# Patient Record
Sex: Female | Born: 1970 | Race: White | Hispanic: No | Marital: Married | Smoking: Never smoker
Health system: Southern US, Community
[De-identification: ages and names within clinical notes are randomized; demographics above are authoritative.]

## PROBLEM LIST (undated history)

## (undated) DIAGNOSIS — F32A Depression, unspecified: Secondary | ICD-10-CM

## (undated) DIAGNOSIS — K219 Gastro-esophageal reflux disease without esophagitis: Secondary | ICD-10-CM

## (undated) DIAGNOSIS — M1990 Unspecified osteoarthritis, unspecified site: Secondary | ICD-10-CM

## (undated) DIAGNOSIS — J302 Other seasonal allergic rhinitis: Secondary | ICD-10-CM

## (undated) DIAGNOSIS — J189 Pneumonia, unspecified organism: Secondary | ICD-10-CM

## (undated) DIAGNOSIS — D759 Disease of blood and blood-forming organs, unspecified: Secondary | ICD-10-CM

## (undated) DIAGNOSIS — C801 Malignant (primary) neoplasm, unspecified: Secondary | ICD-10-CM

## (undated) DIAGNOSIS — Z8041 Family history of malignant neoplasm of ovary: Secondary | ICD-10-CM

## (undated) DIAGNOSIS — Z923 Personal history of irradiation: Secondary | ICD-10-CM

## (undated) DIAGNOSIS — R011 Cardiac murmur, unspecified: Secondary | ICD-10-CM

## (undated) DIAGNOSIS — Z803 Family history of malignant neoplasm of breast: Secondary | ICD-10-CM

## (undated) DIAGNOSIS — Z801 Family history of malignant neoplasm of trachea, bronchus and lung: Secondary | ICD-10-CM

## (undated) DIAGNOSIS — F419 Anxiety disorder, unspecified: Secondary | ICD-10-CM

## (undated) HISTORY — PX: BREAST EXCISIONAL BIOPSY: SUR124

## (undated) HISTORY — PX: WISDOM TOOTH EXTRACTION: SHX21

## (undated) HISTORY — DX: Family history of malignant neoplasm of breast: Z80.3

## (undated) HISTORY — DX: Family history of malignant neoplasm of ovary: Z80.41

## (undated) HISTORY — DX: Family history of malignant neoplasm of trachea, bronchus and lung: Z80.1

---
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
## 2006-08-03 HISTORY — PX: CHOLECYSTECTOMY: SHX55

## 2011-12-23 DIAGNOSIS — J209 Acute bronchitis, unspecified: Secondary | ICD-10-CM | POA: Insufficient documentation

## 2012-01-20 DIAGNOSIS — I341 Nonrheumatic mitral (valve) prolapse: Secondary | ICD-10-CM | POA: Insufficient documentation

## 2012-01-20 DIAGNOSIS — R0789 Other chest pain: Secondary | ICD-10-CM | POA: Insufficient documentation

## 2012-01-20 DIAGNOSIS — D6851 Activated protein C resistance: Secondary | ICD-10-CM | POA: Insufficient documentation

## 2013-11-15 DIAGNOSIS — G459 Transient cerebral ischemic attack, unspecified: Secondary | ICD-10-CM | POA: Insufficient documentation

## 2013-11-16 DIAGNOSIS — F419 Anxiety disorder, unspecified: Secondary | ICD-10-CM | POA: Insufficient documentation

## 2013-11-16 DIAGNOSIS — D6859 Other primary thrombophilia: Secondary | ICD-10-CM | POA: Insufficient documentation

## 2017-04-10 DIAGNOSIS — R519 Headache, unspecified: Secondary | ICD-10-CM | POA: Insufficient documentation

## 2017-08-03 HISTORY — PX: BREAST BIOPSY: SHX20

## 2018-04-13 ENCOUNTER — Other Ambulatory Visit: Payer: Self-pay | Admitting: Obstetrics and Gynecology

## 2018-04-13 DIAGNOSIS — R928 Other abnormal and inconclusive findings on diagnostic imaging of breast: Secondary | ICD-10-CM

## 2018-04-14 ENCOUNTER — Other Ambulatory Visit: Payer: Self-pay | Admitting: Obstetrics and Gynecology

## 2018-04-14 ENCOUNTER — Other Ambulatory Visit (INDEPENDENT_AMBULATORY_CARE_PROVIDER_SITE_OTHER): Payer: Self-pay

## 2018-04-14 ENCOUNTER — Ambulatory Visit (INDEPENDENT_AMBULATORY_CARE_PROVIDER_SITE_OTHER): Payer: 59 | Admitting: Family Medicine

## 2018-04-14 ENCOUNTER — Ambulatory Visit
Admission: RE | Admit: 2018-04-14 | Discharge: 2018-04-14 | Disposition: A | Discharge status: Home / Self Care | Payer: 59 | Source: Ambulatory Visit | Attending: Obstetrics and Gynecology | Admitting: Obstetrics and Gynecology

## 2018-04-14 ENCOUNTER — Encounter (INDEPENDENT_AMBULATORY_CARE_PROVIDER_SITE_OTHER): Payer: Self-pay | Admitting: Family Medicine

## 2018-04-14 ENCOUNTER — Encounter (INDEPENDENT_AMBULATORY_CARE_PROVIDER_SITE_OTHER): Payer: Self-pay

## 2018-04-14 DIAGNOSIS — M25511 Pain in right shoulder: Secondary | ICD-10-CM

## 2018-04-14 DIAGNOSIS — R928 Other abnormal and inconclusive findings on diagnostic imaging of breast: Secondary | ICD-10-CM

## 2018-04-14 DIAGNOSIS — G8929 Other chronic pain: Secondary | ICD-10-CM | POA: Diagnosis not present

## 2018-04-14 DIAGNOSIS — N6320 Unspecified lump in the left breast, unspecified quadrant: Secondary | ICD-10-CM

## 2018-04-14 IMAGING — US ULTRASOUND LEFT BREAST LIMITED
1 series · 7 of 7 positions shown · non-contrast
Comparison: Previous exam(s).

CLINICAL DATA: Patient presents for additional views of the left
breast to evaluate possible microcalcifications and possible mass.

EXAM:
DIGITAL DIAGNOSTIC left MAMMOGRAM WITH TOMO
ULTRASOUND left BREAST

[Series 1: ultrasound left breast limited · 0.07mm/px · 7 of 7 slices shown]
[im 1/7]
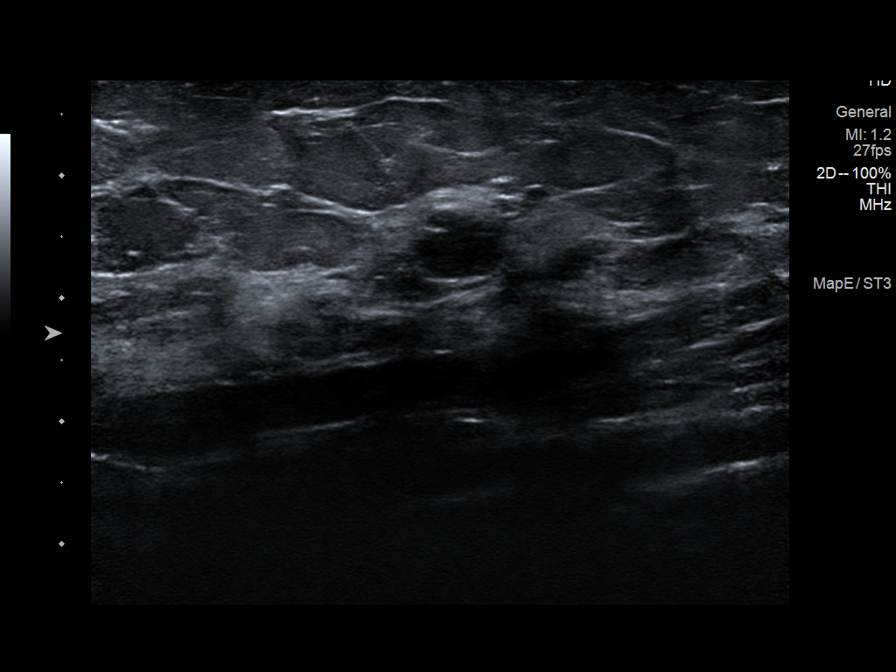
[im 2/7]
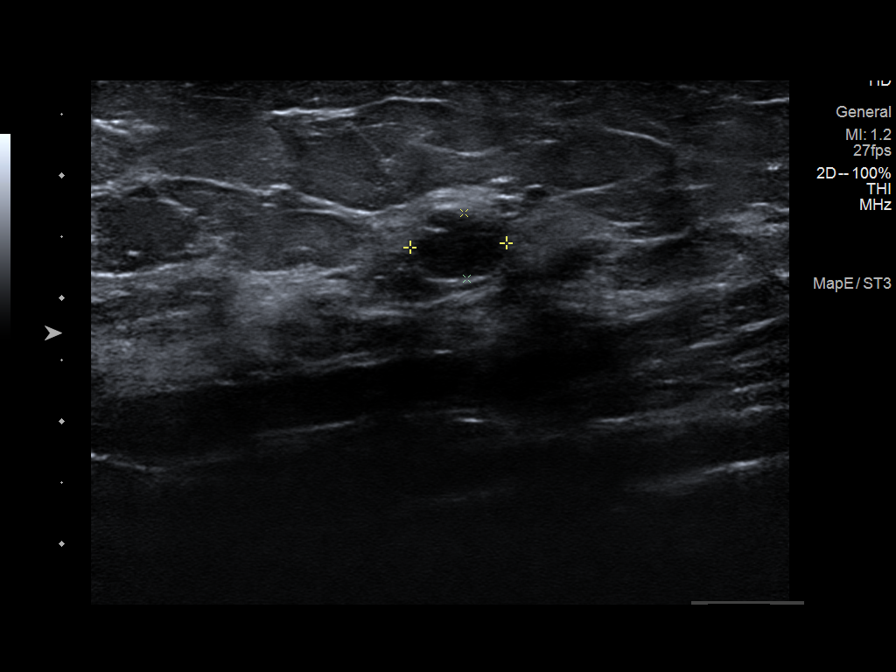
[im 3/7]
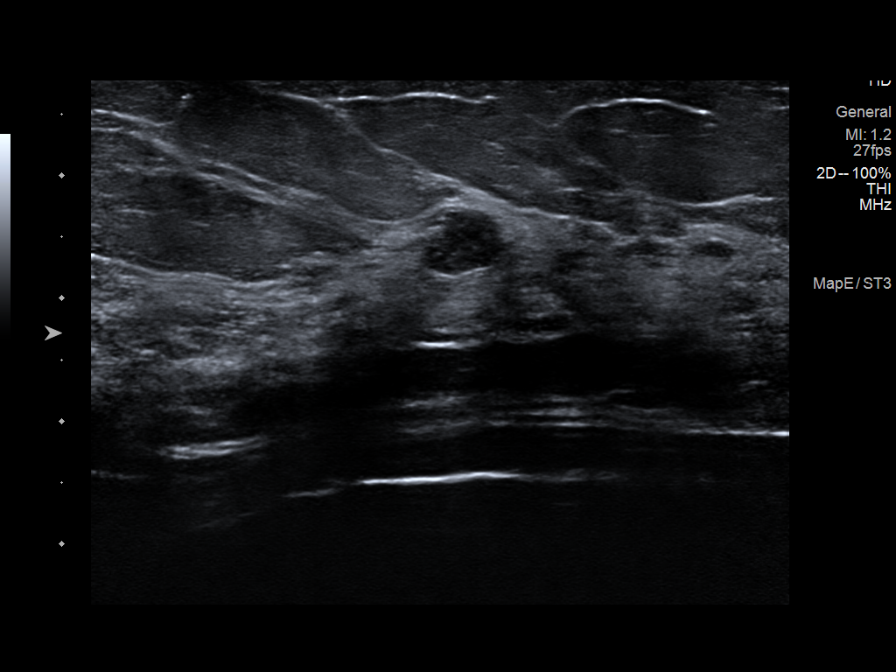
[im 4/7]
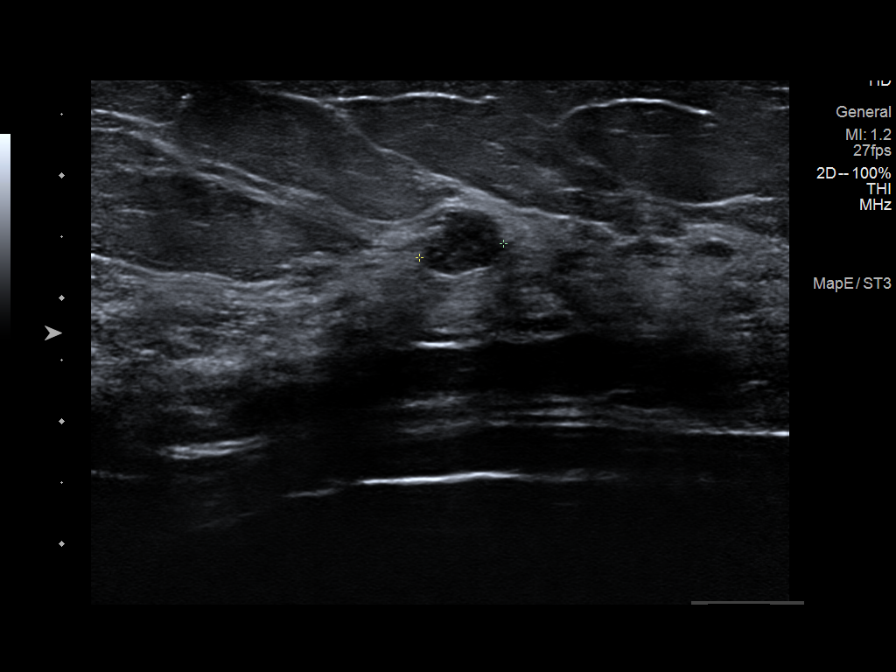
[im 5/7]
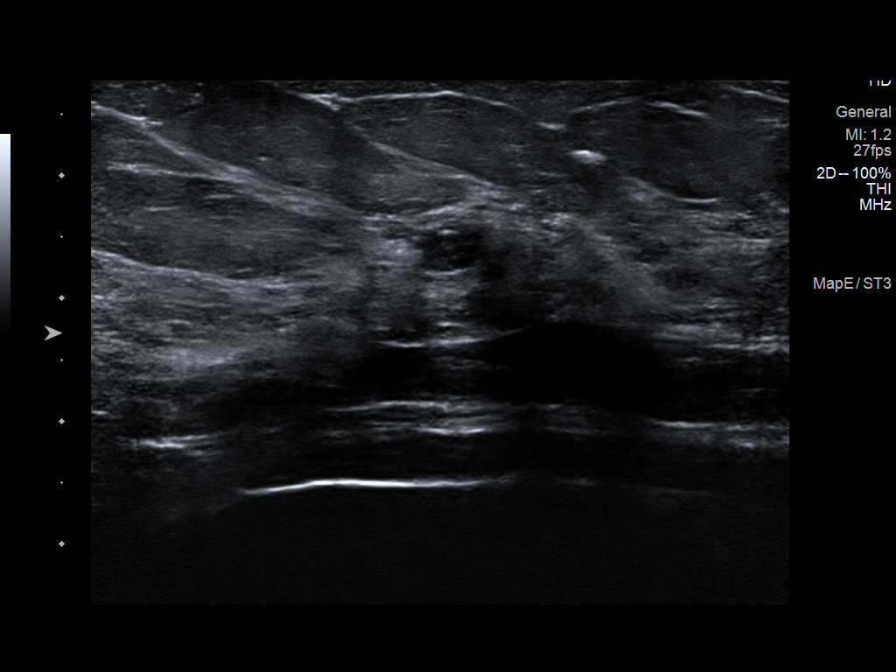
[im 6/7]
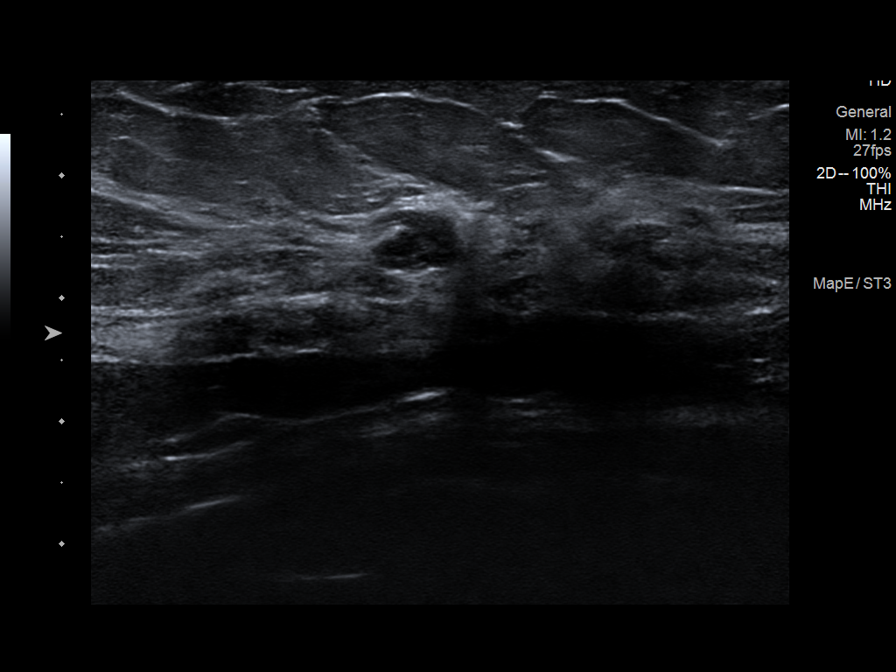
[im 7/7]
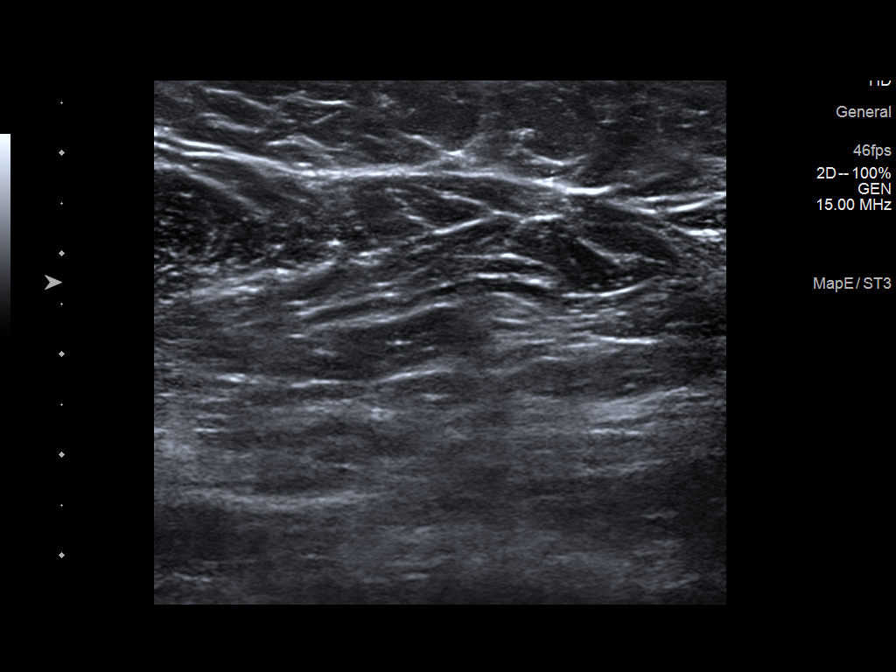

[7 of 7 positions shown; findings below may reference images not displayed]

ACR Breast Density Category c: The breast tissue is heterogeneously
dense, which may obscure small masses.
FINDINGS: Multiple additional images were obtained demonstrating a group of
coarse calcifications spanning 6 mm over the upper-outer left breast
which appear to change shape on the lateral image with possible
layering as these are likely benign. There is persistence of an oval
circumscribed subcentimeter mass over the slightly upper breast on
the MLO view which persists over the upper breast on the lateral
image but is not visualized on the screening CC image. This is
likely within the outer breast.

Targeted ultrasound is performed, showing an oval hypoechoic mass
with mild increased through transmission at the 3 o'clock position
of the left breast 7 cm from the nipple likely corresponding to the
mammographic finding. This measures 5 x 7 x 8 mm as there is a
suggestion of duct extension and slight border irregularity. This
may represent a small papilloma, fibroadenoma or apocrine
metaplasia. Malignancy is less likely.

Ultrasound of the left axilla is normal.
IMPRESSION: Indeterminate 8 mm mass over the 3 o'clock position of the left
breast 7 cm from the nipple.

Probable benign 6 mm group of microcalcifications over the upper
outer left breast.

RECOMMENDATION:
Recommend ultrasound-guided core needle biopsy of the indeterminate
left breast mass. If benign, then recommend six-month follow-up
diagnostic left breast mammogram with magnification views to
document stability of the probable benign microcalcifications. If
malignant, would then recommend stereotactic core biopsy of the left
breast microcalcifications.

I have discussed the findings and recommendations with the patient.
Results were also provided in writing at the conclusion of the
visit. If applicable, a reminder letter will be sent to the patient
regarding the next appointment.

BI-RADS CATEGORY  4: Suspicious.

Biopsy will be scheduled here at the [REDACTED] prior to
patient's departure.

## 2018-04-14 MED ORDER — METHYLPREDNISOLONE ACETATE 40 MG/ML IJ SUSP: 40.0000 mg | Freq: Once | INTRAMUSCULAR | Status: DC

## 2018-04-14 NOTE — Progress Notes (Signed)
Office Visit Note   Patient: Tracy Dyer           Date of Birth: [birth year]           MRN: [id number] Visit Date: 04/14/2018 Requested by: No referring provider defined for this encounter. PCP: [name], MD  Subjective: Chief Complaint  Patient presents with  . Right Shoulder - Pain    HPI: She is a 47 year old right-hand-dominant female seen at the request of Kym Groom for right shoulder pain.  About 5 or 6 months ago before moving here, she fell off a step stool and landed directly on her shoulder.  She had another injury falling down some stairs.  Prior to moving she went to a doctor who ordered an MRI scan which showed a partial rotator cuff tear.  She thought she had the CD with her today but it was C-spine MRI scan instead.  She has been working with Burnice Logan about 15 sessions and the treatments give her temporary relief but her pain does not seem to be going away.  Painful on the posterior and anterior aspect of her shoulder with limited range of motion.  She is not taking medication for her pain.              ROS: She has a history of factor V Leyden and mitral valve prolapse.  All other systems were negative.  Objective: Vital Signs: There were no vitals taken for this visit.  Physical Exam:  Right shoulder: Adhesive capsulitis with abduction 90 degrees, external rotation 75 degrees and internal rotation 45 degrees.  Left shoulder has 90 degrees, 90 degrees and 90 degrees respectively.  Right shoulder is tender near the [facility] joint, and in the lateral subacromial space.  She has pain with internal rotation against resistance and with empty can test.  5/5 rotator cuff strength throughout.  Imaging: Musculoskeletal ultrasound: Slight arthritic change in the [facility] joint with a little bit of an effusion.  Biceps tendon is located in its groove and does not have any obvious abnormality.  Subscapularis tendon likewise looks normal.  Supraspinatus has a small articular surface  partial tear.  There is thickening of the subacromial/subdeltoid bursa.  Infraspinatus tendon looks intact.  Assessment & Plan: 1.  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  She also wants to continue with physical therapy.  If she fails to improve, then consultation with 1 of our surgeons.   Follow-Up Instructions: No follow-ups on file.     Procedures: Right shoulder injection: After sterile prep with Betadine, injected 5 cc 1% lidocaine without epinephrine and 40 mg methylprednisolone into the subacromial/subdeltoid bursa using ultrasound to guide needle placement.   PMFS History: Patient Active Problem List   Diagnosis Date Noted  . Anxiety 11/16/2013  . Hypercoagulable state ([place]) 11/16/2013  . TIA (transient ischemic attack) 11/15/2013  . Chest discomfort 01/20/2012  . Factor V Leiden ([place]) 01/20/2012  . MVP (mitral valve prolapse) 01/20/2012  . Acute bronchitis 12/23/2011   History reviewed. No pertinent past medical history.  History reviewed. No pertinent family history.  History reviewed. No pertinent surgical history. Social History   Occupational History  . Not on file  Tobacco Use  . Smoking status: Not on file  Substance and Sexual Activity  . Alcohol use: Not on file  . Drug use: Not on file  . Sexual activity: Not on file

## 2018-04-18 ENCOUNTER — Ambulatory Visit
Admission: RE | Admit: 2018-04-18 | Discharge: 2018-04-18 | Disposition: A | Discharge status: Home / Self Care | Payer: 59 | Source: Ambulatory Visit | Attending: Obstetrics and Gynecology | Admitting: Obstetrics and Gynecology

## 2018-04-18 DIAGNOSIS — N6320 Unspecified lump in the left breast, unspecified quadrant: Secondary | ICD-10-CM

## 2018-04-18 IMAGING — US US BREAST BX W LOC DEV 1ST LESION IMG BX SPEC US GUIDE*L*
1 series · 8 of 8 positions shown · non-contrast
Comparison: Previous exam(s).

ADDENDUM:
Pathology revealed BENIGN BREAST TISSUE WITH FIBROCYSTIC CHANGE AND
USUAL DUCTAL HYPERPLASIA of LEFT breast, 3 o'clock. This was found
to be concordant by Dr. MOOLMAN.

Pathology results were discussed with the patient by telephone. The
patient reported doing well after the biopsy with tenderness at the
site. Post biopsy instructions and care were reviewed and questions
were answered. The patient was encouraged to call The [REDACTED]
The patient was instructed to return for LEFT breast diagnostic
mammography in 6 months with magnification views and informed a
reminder notice would be sent regarding this appointment.
Pathology results reported by MOOLMAN, RN on [DATE].
CLINICAL DATA: Ultrasound-guided core needle biopsy was recommended
of a mass in the 3 o'clock position of the a left breast 7 cm from
the nipple.
EXAM:
ULTRASOUND GUIDED LEFT BREAST CORE NEEDLE BIOPSY

[Series 1: us breast bx w loc dev 1st lesion img bx spec us g · 0.06mm/px · 8 of 8 slices shown]
[im 1/8]
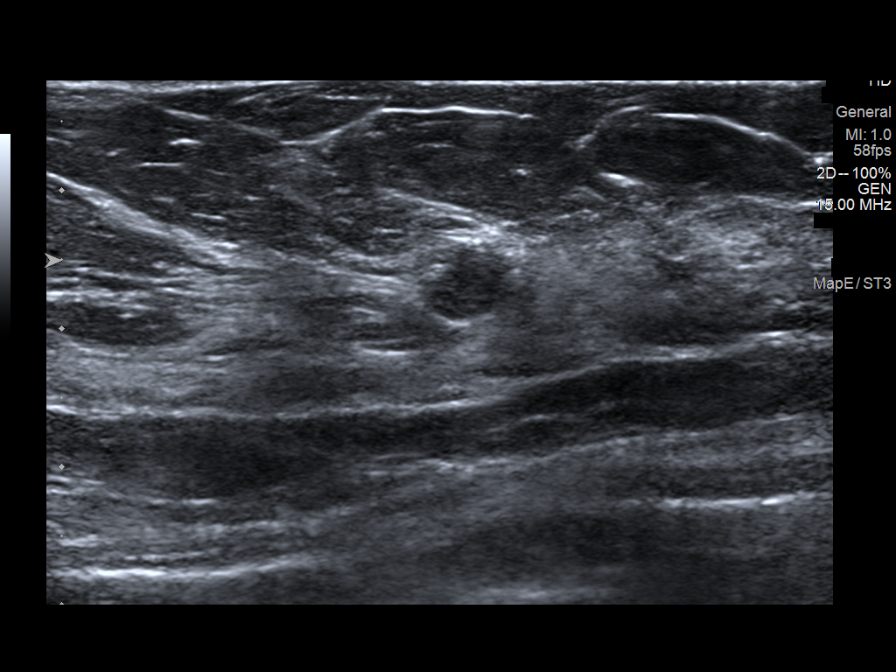
[im 2/8]
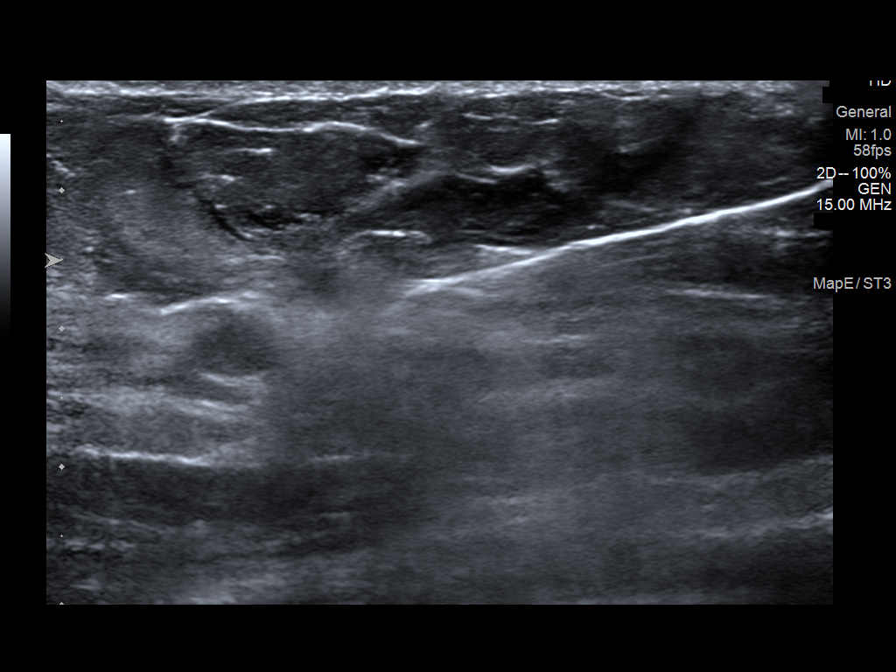
[im 3/8]
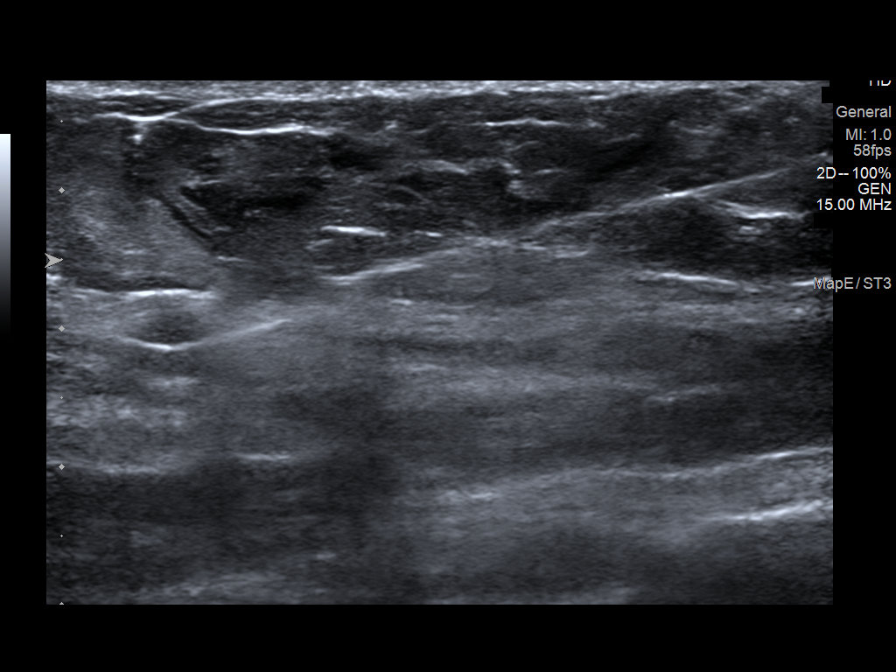
[im 4/8]
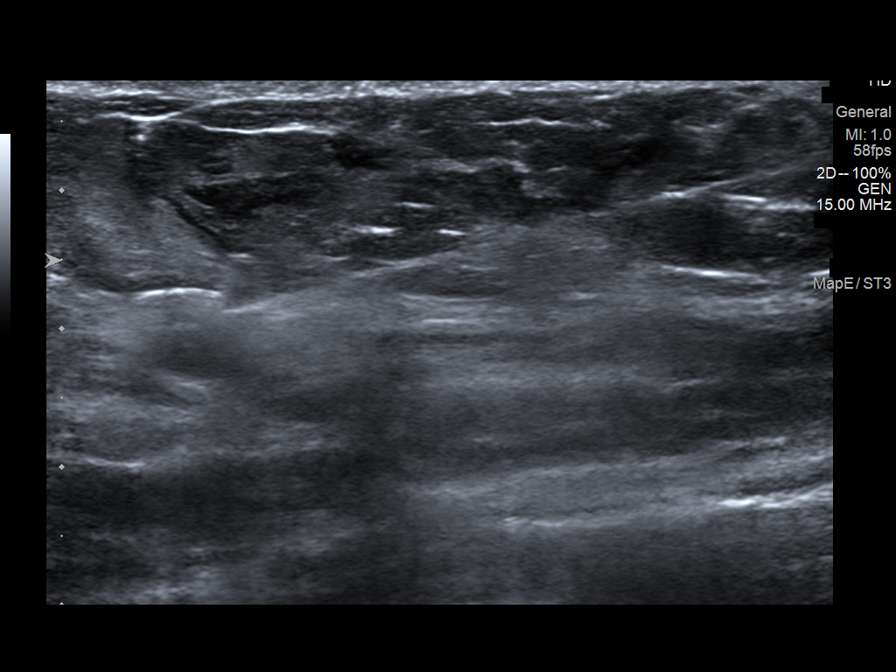
[im 5/8]
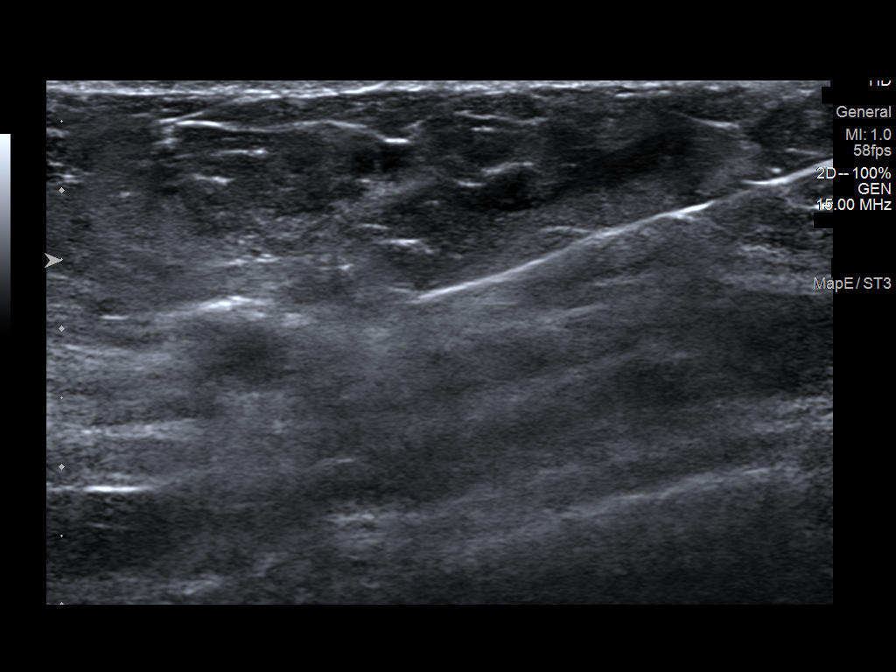
[im 6/8]
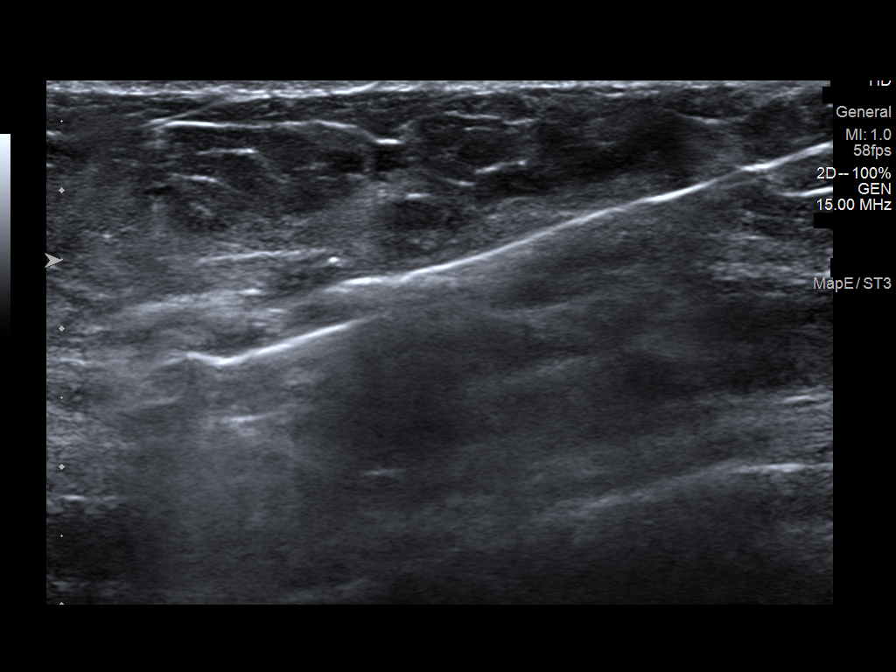
[im 7/8]
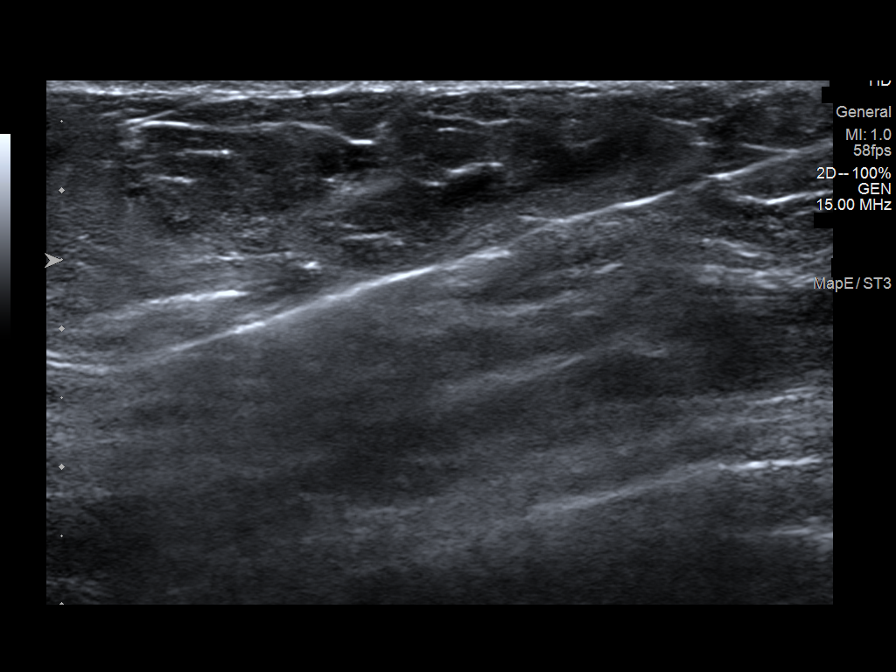
[im 8/8]
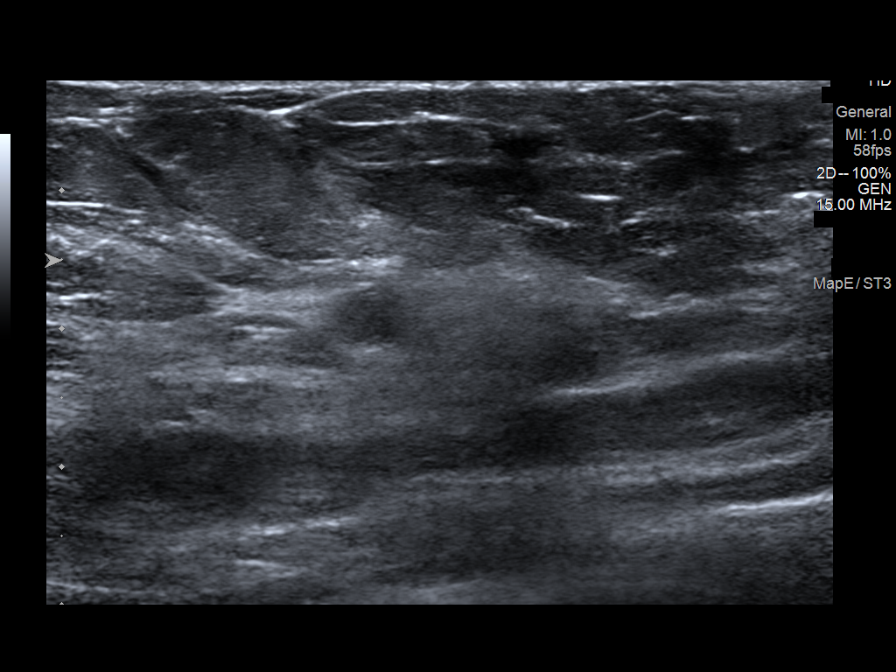

[8 of 8 positions shown; findings below may reference images not displayed]



Lesion quadrant: Upper outer quadrant

Using sterile technique and 1% Lidocaine as local anesthetic, under
direct ultrasound visualization, a 12 gauge MOOLMAN device was
used to perform biopsy of an 8 mm mass at 3 o'clock position 7 cm
from the nipple using a lateral approach. At the conclusion of the
procedure a ribbon tissue marker clip was deployed into the biopsy
cavity. Follow up 2 view mammogram was performed and dictated
separately.
IMPRESSION: Ultrasound guided biopsy of the left breast. No apparent
complications.

## 2018-04-18 IMAGING — MG MM CLIP PLACEMENT
2 series · 2 of 2 positions shown · non-contrast
Comparison: Previous exam(s).

CLINICAL DATA: Ultrasound-guided core needle biopsy was performed
of an 8 mm mass in the 3 o'clock position of the left breast.

EXAM:
DIAGNOSTIC LEFT MAMMOGRAM POST ULTRASOUND BIOPSY

[L CC]
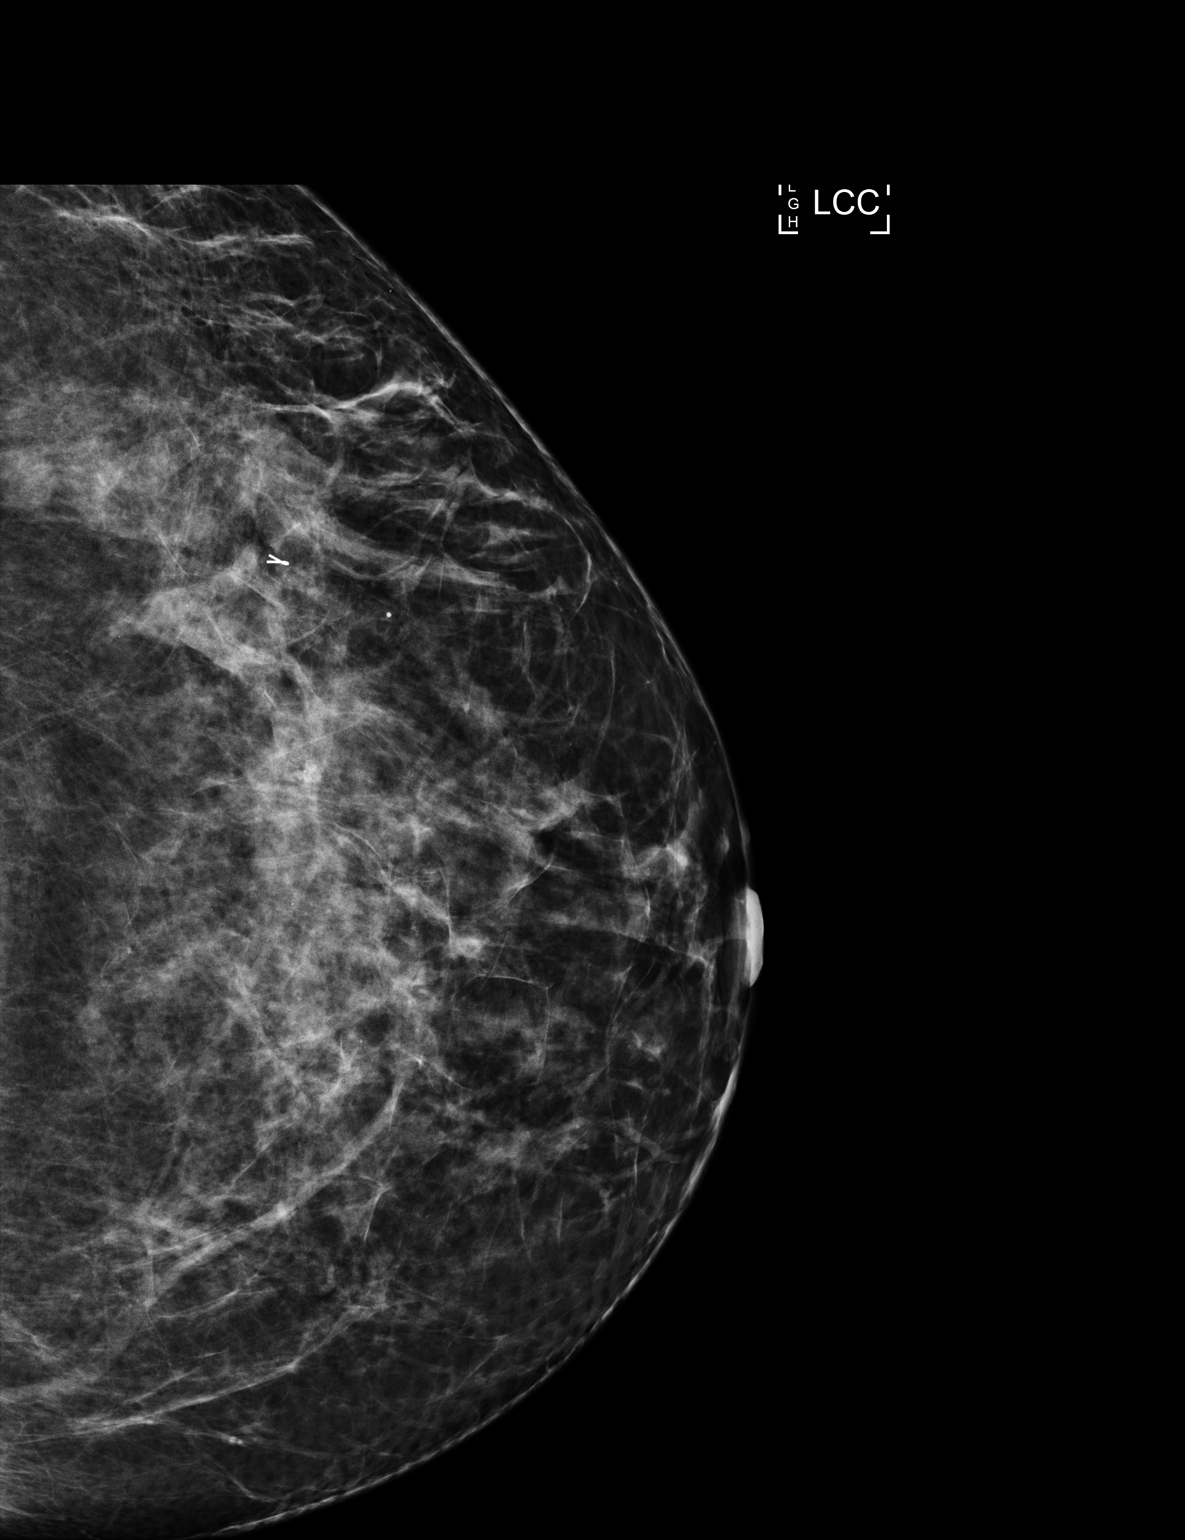

[L ML]
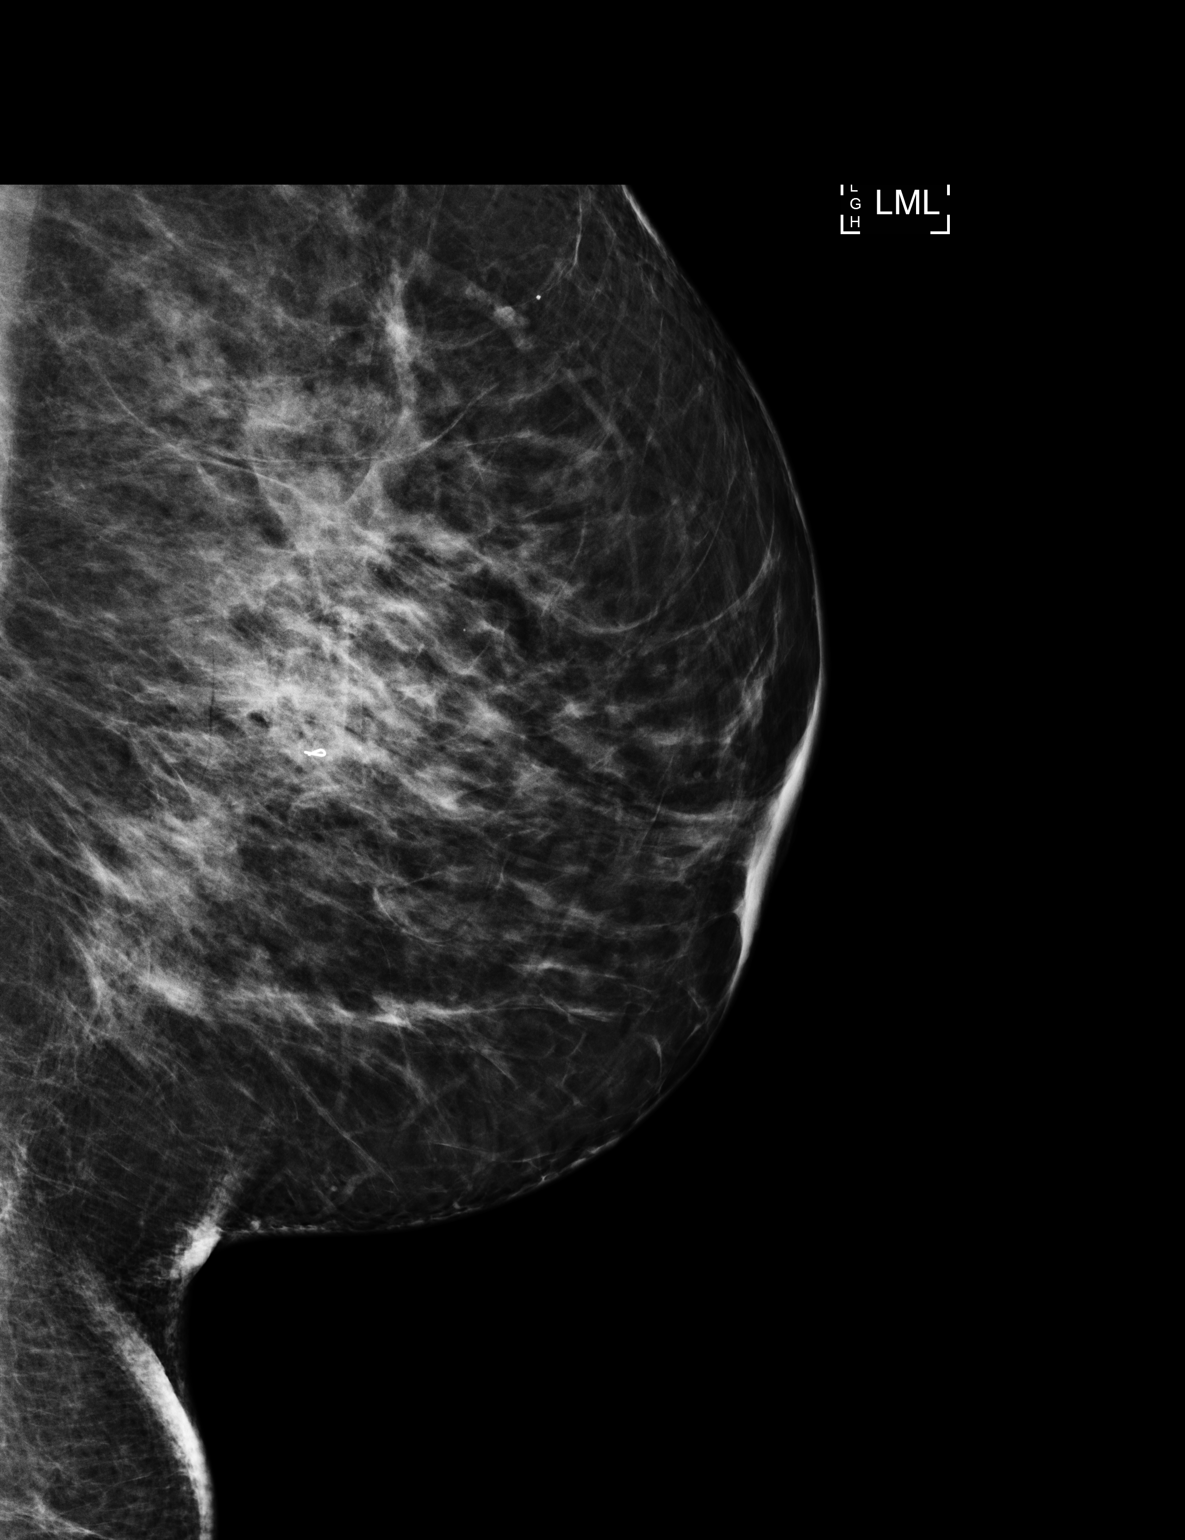

[2 of 2 positions shown; findings below may reference images not displayed]

FINDINGS: Mammographic images were obtained following ultrasound guided biopsy
of a left breast mass. A ribbon shaped biopsy clip is in the
expected position of the mass. The mass is not well visualized the
post clip mammogram.
IMPRESSION: Ribbon shaped biopsy clip in the expected location of the biopsied
mass.

Final Assessment: Post Procedure Mammograms for Marker Placement

## 2018-07-07 ENCOUNTER — Encounter: Payer: Self-pay | Admitting: Allergy & Immunology

## 2018-07-07 ENCOUNTER — Ambulatory Visit (INDEPENDENT_AMBULATORY_CARE_PROVIDER_SITE_OTHER): Payer: 59 | Admitting: Allergy & Immunology

## 2018-07-07 VITALS — BP 106/66 | HR 68 | Temp 97.6°F | Resp 12 | Ht 66.14 in | Wt 199.5 lb

## 2018-07-07 DIAGNOSIS — J3089 Other allergic rhinitis: Secondary | ICD-10-CM

## 2018-07-07 DIAGNOSIS — J302 Other seasonal allergic rhinitis: Secondary | ICD-10-CM

## 2018-07-07 DIAGNOSIS — T781XXD Other adverse food reactions, not elsewhere classified, subsequent encounter: Secondary | ICD-10-CM | POA: Diagnosis not present

## 2018-07-07 NOTE — Patient Instructions (Addendum)
1. Seasonal and perennial allergic rhinitis - Testing today showed: trees, weeds, grasses, indoor molds, outdoor molds, dust mites and dog - Copy of test results provided.  - Avoidance measures provided. - Stop taking: Claritin - Start taking: Allegra (fexofenadine) 180mg  table once daily OR Xyzal (levocetirizine) 5mg  tablet once daily (samples of each provided) - You can use an extra dose of the antihistamine, if needed, for breakthrough symptoms.  - Consider nasal saline rinses 1-2 times daily to remove allergens from the nasal cavities as well as help with mucous clearance (this is especially helpful to do before the nasal sprays are given) - Allergy shot consent signed today. - Make an appointment in two weeks to start injections in [place].   2. Return in about 3 months (around 10/06/2018) for an Office Visit and 2 weeks to start allergy injections.    Please inform us of any Emergency Department visits, hospitalizations, or changes in symptoms. Call us before going to the ED for breathing or allergy symptoms since we might be able to fit you in for a sick visit. Feel free to contact us anytime with any questions, problems, or concerns.  It was a pleasure to meet you today! You are a HOOT!   Websites that have reliable patient information: 1. American Academy of Asthma, Allergy, and Immunology: www.aaaai.org 2. Food Allergy Research and Education (FARE): foodallergy.org 3. Mothers of Asthmatics: http://www.asthmacommunitynetwork.org 4. American College of Allergy, Asthma, and Immunology: MonthlyElectricBill.co.uk   Make sure you are registered to vote! If you have moved or changed any of your contact information, you will need to get this updated before voting!   Reducing Pollen Exposure  The American Academy of Allergy, Asthma and Immunology suggests the following steps to reduce your exposure to pollen during allergy seasons.    1. Do not hang sheets or clothing out to dry; pollen may  collect on these items. 2. Do not mow lawns or spend time around freshly cut grass; mowing stirs up pollen. 3. Keep windows closed at night.  Keep car windows closed while driving. 4. Minimize morning activities outdoors, a time when pollen counts are usually at their highest. 5. Stay indoors as much as possible when pollen counts or humidity is high and on windy days when pollen tends to remain in the air longer. 6. Use air conditioning when possible.  Many air conditioners have filters that trap the pollen spores. 7. Use a HEPA room air filter to remove pollen form the indoor air you breathe.  Control of Mold Allergen   Mold and fungi can grow on a variety of surfaces provided certain temperature and moisture conditions exist.  Outdoor molds grow on plants, decaying vegetation and soil.  The major outdoor mold, Alternaria and Cladosporium, are found in very high numbers during hot and dry conditions.  Generally, a late Summer - Fall peak is seen for common outdoor fungal spores.  Rain will temporarily lower outdoor mold spore count, but counts rise rapidly when the rainy period ends.  The most important indoor molds are Aspergillus and Penicillium.  Dark, humid and poorly ventilated basements are ideal sites for mold growth.  The next most common sites of mold growth are the bathroom and the kitchen.  Outdoor (Seasonal) Mold Control  Positive outdoor molds via skin testing: Bipolaris (Helminthsporium), Drechslera (Curvalaria) and Mucor  1. Use air conditioning and keep windows closed 2. Avoid exposure to decaying vegetation. 3. Avoid leaf raking. 4. Avoid grain handling. 5. Consider wearing a face  mask if working in moldy areas.  6.   Indoor (Perennial) Mold Control   Positive indoor molds via skin testing: Fusarium, Aureobasidium (Pullulara) and Rhizopus  1. Maintain humidity below 50%. 2. Clean washable surfaces with 5% bleach solution. 3. Remove sources e.g. contaminated  carpets.   Control of House Dust Mite Allergen    House dust mites play a major role in allergic asthma and rhinitis.  They occur in environments with high humidity wherever human skin, the food for dust mites is found. High levels have been detected in dust obtained from mattresses, pillows, carpets, upholstered furniture, bed covers, clothes and soft toys.  The principal allergen of the house dust mite is found in its feces.  A gram of dust may contain 1,000 mites and 250,000 fecal particles.  Mite antigen is easily measured in the air during house cleaning activities.    1. Encase mattresses, including the box spring, and pillow, in an air tight cover.  Seal the zipper end of the encased mattresses with wide adhesive tape. 2. Wash the bedding in water of 130 degrees Farenheit weekly.  Avoid cotton comforters/quilts and flannel bedding: the most ideal bed covering is the dacron comforter. 3. Remove all upholstered furniture from the bedroom. 4. Remove carpets, carpet padding, rugs, and non-washable window drapes from the bedroom.  Wash drapes weekly or use plastic window coverings. 5. Remove all non-washable stuffed toys from the bedroom.  Wash stuffed toys weekly. 6. Have the room cleaned frequently with a vacuum cleaner and a damp dust-mop.  The patient should not be in a room which is being cleaned and should wait 1 hour after cleaning before going into the room. 7. Close and seal all heating outlets in the bedroom.  Otherwise, the room will become filled with dust-laden air.  An electric heater can be used to heat the room. 8. Reduce indoor humidity to less than 50%.  Do not use a humidifier.  Control of Dog or Cat Allergen  Avoidance is the best way to manage a dog or cat allergy. If you have a dog or cat and are allergic to dog or cats, consider removing the dog or cat from the home. If you have a dog or cat but don't want to find it a new home, or if your family wants a pet even though  someone in the household is allergic, here are some strategies that may help keep symptoms at bay:  1. Keep the pet out of your bedroom and restrict it to only a few rooms. Be advised that keeping the dog or cat in only one room will not limit the allergens to that room. 2. Don't pet, hug or kiss the dog or cat; if you do, wash your hands with soap and water. 3. High-efficiency particulate air (HEPA) cleaners run continuously in a bedroom or living room can reduce allergen levels over time. 4. Regular use of a high-efficiency vacuum cleaner or a central vacuum can reduce allergen levels. 5. Giving your dog or cat a bath at least once a week can reduce airborne allergen.  Allergy Shots   Allergies are the result of a chain reaction that starts in the immune system. Your immune system controls how your body defends itself. For instance, if you have an allergy to pollen, your immune system identifies pollen as an invader or allergen. Your immune system overreacts by producing antibodies called Immunoglobulin E (IgE). These antibodies travel to cells that release chemicals, causing an allergic reaction.  The concept behind allergy immunotherapy, whether it is received in the form of shots or tablets, is that the immune system can be desensitized to specific allergens that trigger allergy symptoms. Although it requires time and patience, the payback can be long-term relief.  How Do Allergy Shots Work?  Allergy shots work much like a vaccine. Your body responds to injected amounts of a particular allergen given in increasing doses, eventually developing a resistance and tolerance to it. Allergy shots can lead to decreased, minimal or no allergy symptoms.  There generally are two phases: build-up and maintenance. Build-up often ranges from three to six months and involves receiving injections with increasing amounts of the allergens. The shots are typically given once or twice a week, though more rapid  build-up schedules are sometimes used.  The maintenance phase begins when the most effective dose is reached. This dose is different for each person, depending on how allergic you are and your response to the build-up injections. Once the maintenance dose is reached, there are longer periods between injections, typically two to four weeks.  Occasionally doctors give cortisone-type shots that can temporarily reduce allergy symptoms. These types of shots are different and should not be confused with allergy immunotherapy shots.  Who Can Be Treated with Allergy Shots?  Allergy shots may be a good treatment approach for people with allergic rhinitis (hay fever), allergic asthma, conjunctivitis (eye allergy) or stinging insect allergy.   Before deciding to begin allergy shots, you should consider:  . The length of allergy season and the severity of your symptoms . Whether medications and/or changes to your environment can control your symptoms . Your desire to avoid long-term medication use . Time: allergy immunotherapy requires a major time commitment . Cost: may vary depending on your insurance coverage  Allergy shots for children age 62 and older are effective and often well tolerated. They might prevent the onset of new allergen sensitivities or the progression to asthma.  Allergy shots are not started on patients who are pregnant but can be continued on patients who become pregnant while receiving them. In some patients with other medical conditions or who take certain common medications, allergy shots may be of risk. It is important to mention other medications you talk to your allergist.   When Will I Feel Better?  Some may experience decreased allergy symptoms during the build-up phase. For others, it may take as long as 12 months on the maintenance dose. If there is no improvement after a year of maintenance, your allergist will discuss other treatment options with you.  If you aren't  responding to allergy shots, it may be because there is not enough dose of the allergen in your vaccine or there are missing allergens that were not identified during your allergy testing. Other reasons could be that there are high levels of the allergen in your environment or major exposure to non-allergic triggers like tobacco smoke.  What Is the Length of Treatment?  Once the maintenance dose is reached, allergy shots are generally continued for three to five years. The decision to stop should be discussed with your allergist at that time. Some people may experience a permanent reduction of allergy symptoms. Others may relapse and a longer course of allergy shots can be considered.  What Are the Possible Reactions?  The two types of adverse reactions that can occur with allergy shots are local and systemic. Common local reactions include very mild redness and swelling at the injection site, which can  happen immediately or several hours after. A systemic reaction, which is less common, affects the entire body or a particular body system. They are usually mild and typically respond quickly to medications. Signs include increased allergy symptoms such as sneezing, a stuffy nose or hives.  Rarely, a serious systemic reaction called anaphylaxis can develop. Symptoms include swelling in the throat, wheezing, a feeling of tightness in the chest, nausea or dizziness. Most serious systemic reactions develop within 30 minutes of allergy shots. This is why it is strongly recommended you wait in your doctor's office for 30 minutes after your injections. Your allergist is trained to watch for reactions, and his or her staff is trained and equipped with the proper medications to identify and treat them.  Who Should Administer Allergy Shots?  The preferred location for receiving shots is your prescribing allergist's office. Injections can sometimes be given at another facility where the physician and staff are  trained to recognize and treat reactions, and have received instructions by your prescribing allergist.

## 2018-07-07 NOTE — Progress Notes (Signed)
NEW PATIENT  Date of Service/Encounter:  07/07/18  Referring provider: [name], MD   Assessment:   Seasonal and perennial allergic rhinitis (trees, weeds, grasses, indoor molds, outdoor molds, dust mites and dog) - wishing to initiate allergen immunotherapy  Adverse food reaction - with negative testing to the most common food allergens  Plan/Recommendations:   1. Seasonal and perennial allergic rhinitis - Testing today showed: trees, weeds, grasses, indoor molds, outdoor molds, dust mites and dog - Copy of test results provided.  - Avoidance measures provided. - Stop taking: Claritin - Start taking: Allegra (fexofenadine) 180mg  table once daily OR Xyzal (levocetirizine) 5mg  tablet once daily (samples of each provided) - You can use an extra dose of the antihistamine, if needed, for breakthrough symptoms.  - Consider nasal saline rinses 1-2 times daily to remove allergens from the nasal cavities as well as help with mucous clearance (this is especially helpful to do before the nasal sprays are given) - Allergy shot consent signed today. - Make an appointment in two weeks to start injections in [place].   2. Return in about 3 months (around 10/06/2018) for an Office Visit and 2 weeks to start allergy injections.    Subjective:   Tracy Dyer is a 47 y.o. female presenting today for evaluation of  Chief Complaint  Patient presents with  . Allergic Rhinitis     Tracy Dyer has a history of the following: Patient Active Problem List   Diagnosis Date Noted  . Anxiety 11/16/2013  . Hypercoagulable state ([place]) 11/16/2013  . TIA (transient ischemic attack) 11/15/2013  . Chest discomfort 01/20/2012  . Factor V Leiden ([place]) 01/20/2012  . MVP (mitral valve prolapse) 01/20/2012  . Acute bronchitis 12/23/2011    History obtained from: chart review and patient.  Tracy Dyer was referred by [name], MD.     Tracy Dyer is a 47 y.o. female presenting for an  evaluation of environmental allergies. She just moved here from [place]. She moved here around April 2019. She currently works for [employer].    She reports that she has ithcy ears and eyes. She has a lot of pets and she knows that she is allergic to dogs. She has chronic ear burning and itching. She has had dogs for quite some time and has coughing when she is exposed to them. She has had cats in her home for one year. Birds have been around two months. She has had a lizard for three years. She has noticed that she has an ear ache since the dogs entered her life.   She is treating with cetirizine as needed but ti does not seem to help. She has tried [employer] without improvement in her symptoms. She reports eye burning.   She also reports some dermatitis on her left side. She knows that "something is going on". These started this past week. She has similar lesions on her bilateral legs. She is thinking that this might be related to foods, although she tolerates the major foods allergens, as far as she knows.   Otherwise, there is no history of other atopic diseases, including asthma, drug allergies, stinging insect allergies, eczema or urticaria. There is no significant infectious history. Vaccinations are up to date.    Past Medical History: Patient Active Problem List   Diagnosis Date Noted  . Anxiety 11/16/2013  . Hypercoagulable state ([place]) 11/16/2013  . TIA (transient ischemic attack) 11/15/2013  . Chest discomfort 01/20/2012  . Factor V Leiden ([place]) 01/20/2012  .  MVP (mitral valve prolapse) 01/20/2012  . Acute bronchitis 12/23/2011    Medication List:  Allergies as of 07/07/2018      Reactions   Sulfa Antibiotics    Other reaction(s): Rash, urticarial   Oxycodone    Propoxyphene Rash      Medication List        Accurate as of 07/07/18 11:59 PM. Always use your most recent med list.          clonazePAM 1 MG tablet Commonly known as:  KLONOPIN clonazepam 1 mg tablet     EPINEPHrine 0.3 mg/0.3 mL Soaj injection Commonly known as:  EPI-PEN Use as directed for severe allergic reactions   fluticasone 50 MCG/ACT nasal spray Commonly known as:  FLONASE SPRAY 2 SPRAYS INTO THE NOTRIL(S) EVERY DAY   sertraline 100 MG tablet Commonly known as:  ZOLOFT   triamcinolone cream 0.1 % Commonly known as:  KENALOG triamcinolone acetonide 0.1 % topical cream       Birth History: non-contributory  Developmental History: non-contributory.   Past Surgical History: Past Surgical History:  Procedure Laterality Date  . CHOLECYSTECTOMY  2008  . WISDOM TOOTH EXTRACTION       Family History: Family History  Problem Relation Age of Onset  . Eczema Father   . Allergic rhinitis Brother      Social History: Tracy Dyer lives at home with her family. She had dogs, cats, and a bird at home. She is from [place] but moved here earlier this year. She works in [employer] at [employer]. There is no smoking at all.      Review of Systems: a 14-point review of systems is pertinent for what is mentioned in HPI.  Otherwise, all other systems were negative. Constitutional: negative other than that listed in the HPI Eyes: negative other than that listed in the HPI Ears, nose, mouth, throat, and face: negative other than that listed in the HPI Respiratory: negative other than that listed in the HPI Cardiovascular: negative other than that listed in the HPI Gastrointestinal: negative other than that listed in the HPI Genitourinary: negative other than that listed in the HPI Integument: negative other than that listed in the HPI Hematologic: negative other than that listed in the HPI Musculoskeletal: negative other than that listed in the HPI Neurological: negative other than that listed in the HPI Allergy/Immunologic: negative other than that listed in the HPI    Objective:   Blood pressure 106/66, pulse 68, temperature 97.6 F (36.4 C), temperature source  Oral, resp. rate 12, height 5' 6.14" (1.68 m), weight 199 lb 8.3 oz (90.5 kg), SpO2 97 %. Body mass index is 32.06 kg/m.   Physical Exam:  General: Alert, interactive, in no acute distress. Very amusing pleasant female.  Eyes: No conjunctival injection bilaterally, no discharge on the right, no discharge on the left and no Horner-Trantas dots present. PERRL bilaterally. EOMI without pain. No photophobia.  Ears: Right TM pearly gray with normal light reflex, Left TM pearly gray with normal light reflex, Right TM intact without perforation and Left TM intact without perforation.  Nose/Throat: External nose within normal limits and septum midline. Turbinates edematous and pale with clear discharge. Posterior oropharynx erythematous without cobblestoning in the posterior oropharynx. Tonsils 2+ without exudates.  Tongue without thrush. Neck: Supple without thyromegaly. Trachea midline. Adenopathy: shoddy bilateral anterior cervical lymphadenopathy and no enlarged lymph nodes appreciated in the occipital, axillary, epitrochlear, inguinal, or popliteal regions. Lungs: Clear to auscultation without wheezing, rhonchi or  rales. No increased work of breathing. CV: Normal S1/S2. No murmurs. Capillary refill <2 seconds.  Abdomen: Nondistended, nontender. No guarding or rebound tenderness. Bowel sounds present in all fields and hypoactive  Skin: Warm and dry, without lesions or rashes. Extremities:  No clubbing, cyanosis or edema. Neuro:   Grossly intact. No focal deficits appreciated. Responsive to questions.  Diagnostic studies:   Allergy Studies:   Airborne Adult Perc - 16-Jul-2018 1535    Time Antigen Placed  1535    Allergen Manufacturer  Lavella Hammock    Location  Back    Number of Test  59    Panel 1  Select    1. Control-Buffer 50% Glycerol  Negative    2. Control-Histamine 1 mg/ml  2+    3. Albumin saline  Negative    4. [place]  Negative    5. [place]  Negative    6. Johnson  Negative    7.  Axis Blue  Negative    8. Meadow Fescue  Negative    9. Perennial Rye  Negative    10. Sweet Vernal  Negative    11. Timothy  Negative    12. Cocklebur  Negative    13. Burweed Mars[place]er  Negative    14. Ragweed, short  Negative    15. Ragweed, Giant  Negative    16. Plantain,  English  Negative    17. Lamb's Quarters  Negative    18. Sheep Sorrell  Negative    19. Rough Pigweed  Negative    20. Marsh Elder, Rough  Negative    21. Mugwort, Common  Negative    22. Ash mix  Negative    23. Birch mix  Negative    24. Beech American  Negative    25. Box, Elder  Negative    26. Cedar, red  Negative    27. Cottonwood, [place]  Negative    28. Elm mix  Negative    29. Hickory mix  Negative    30. Maple mix  Negative    31. Oak, [place] mix  Negative    32. Pecan Pollen  Negative    33. Pine mix  Negative    34. Sycamore Eastern  Negative    35. [employer], Black Pollen  Negative    36. Alternaria alternata  Negative    37. Cladosporium Herbarum  Negative    38. Aspergillus mix  Negative    39. Penicillium mix  Negative    40. Bipolaris sorokiniana (Helminthosporium)  Negative    41. Drechslera spicifera (Curvularia)  Negative    42. Mucor plumbeus  Negative    43. Fusarium moniliforme  Negative    44. Aureobasidium pullulans (pullulara)  Negative    45. Rhizopus oryzae  Negative    46. Botrytis cinera  Negative    47. Epicoccum nigrum  Negative    48. Phoma betae  Negative    49. Candida Albicans  Negative    50. Trichophyton mentagrophytes  Negative    51. Mite, D Farinae  5,000 AU/ml  Negative    52. Mite, D Pteronyssinus  5,000 AU/ml  Negative    53. Cat Hair 10,000 BAU/ml  Negative    54.  Dog Epithelia  Negative    55. Mixed Feathers  Negative    56. Horse Epithelia  Negative    57. Cockroach, German  Negative    58. Mouse  Negative    59. Tobacco Leaf  Negative  Food Perc - 07/07/18 1536    Time Antigen Placed  1536    Allergen Manufacturer  Lavella Hammock     Location  Back    Number of allergen test  10    Food  Select    1. Peanut  Negative    2. Soybean food  Negative    3. Wheat, whole  Negative    4. Sesame  Negative    5. Milk, cow  Negative    6. Egg White, chicken  Negative    7. Casein  Negative    8. Shellfish mix  Negative    9. Fish mix  Negative    10. Cashew  Negative     Intradermal - 07/07/18 1621    Time Antigen Placed  1610    Allergen Manufacturer  Lavella Hammock    Location  Arm    Number of Test  15    Intradermal  Select    Control  Negative    Guatem[place]  Negative    Johnson  1+    7 Grass  1+    Ragweed mix  Negative    Weed mix  1+    Tree mix  1+    Mold 1  Negative    Mold 2  Negative    Mold 3  1+    Mold 4  1+    Cat  Negative    Dog  2+    Cockroach  Negative    Mite mix  1+    Other  Negative        Allergy testing results were read and interpreted by myself, documented by clinical staff.       Salvatore Marvel, MD [facility]

## 2018-07-08 ENCOUNTER — Encounter: Payer: Self-pay | Admitting: Allergy & Immunology

## 2018-07-08 MED ORDER — EPINEPHRINE 0.3 MG/0.3ML IJ SOAJ: INTRAMUSCULAR | 1 refills | Status: DC

## 2018-07-12 ENCOUNTER — Ambulatory Visit
Admission: RE | Admit: 2018-07-12 | Discharge: 2018-07-12 | Disposition: A | Discharge status: Home / Self Care | Payer: 59 | Source: Ambulatory Visit | Attending: Family Medicine | Admitting: Family Medicine

## 2018-07-12 ENCOUNTER — Other Ambulatory Visit: Payer: Self-pay | Admitting: Family Medicine

## 2018-07-12 DIAGNOSIS — R079 Chest pain, unspecified: Secondary | ICD-10-CM

## 2018-07-12 IMAGING — CR DG CHEST 2V
2 series · 2 of 2 positions shown · non-contrast
Comparison: None.

CLINICAL DATA: Left upper anterior chest pain

EXAM:
CHEST - 2 VIEW

[w chest pa]
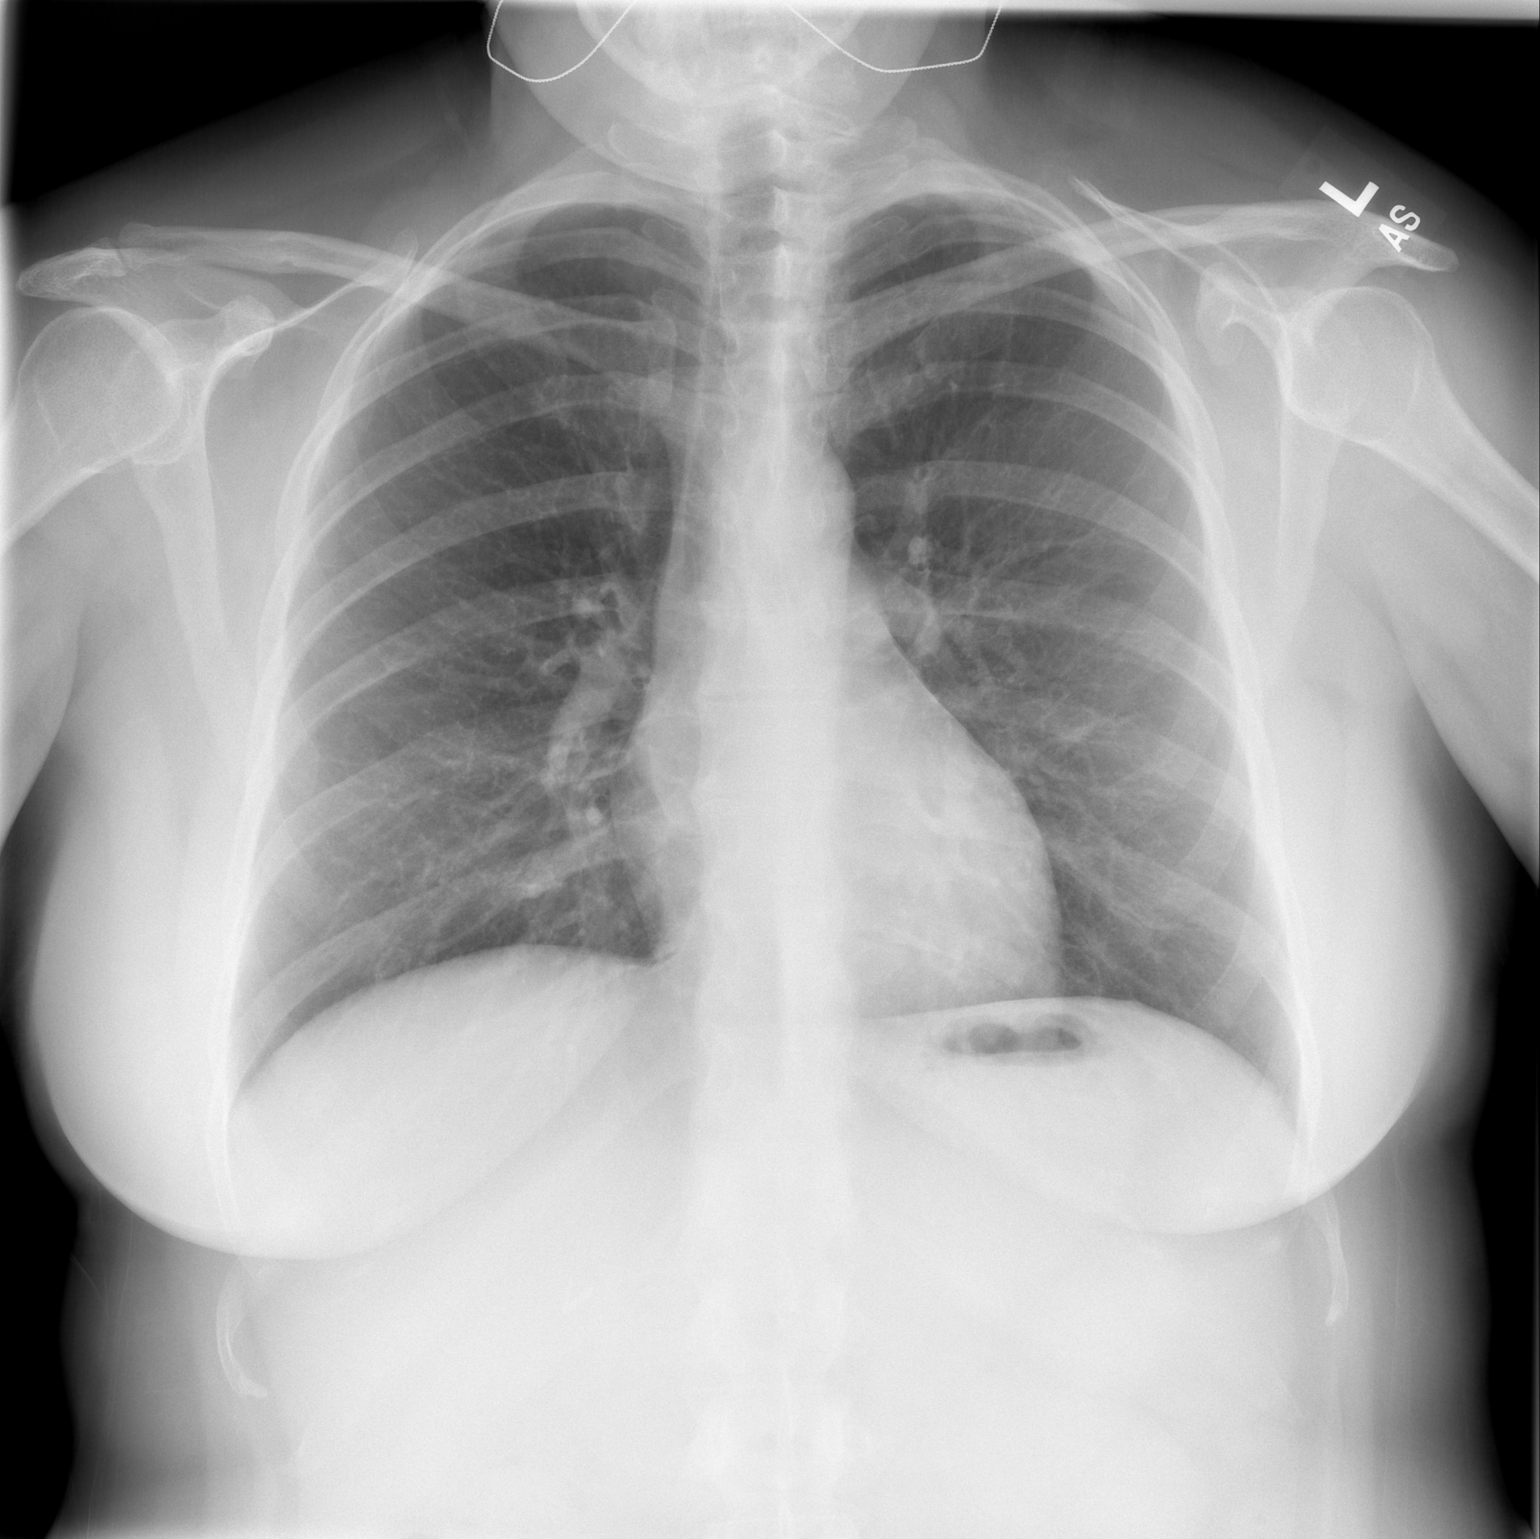

[w chest lat *]
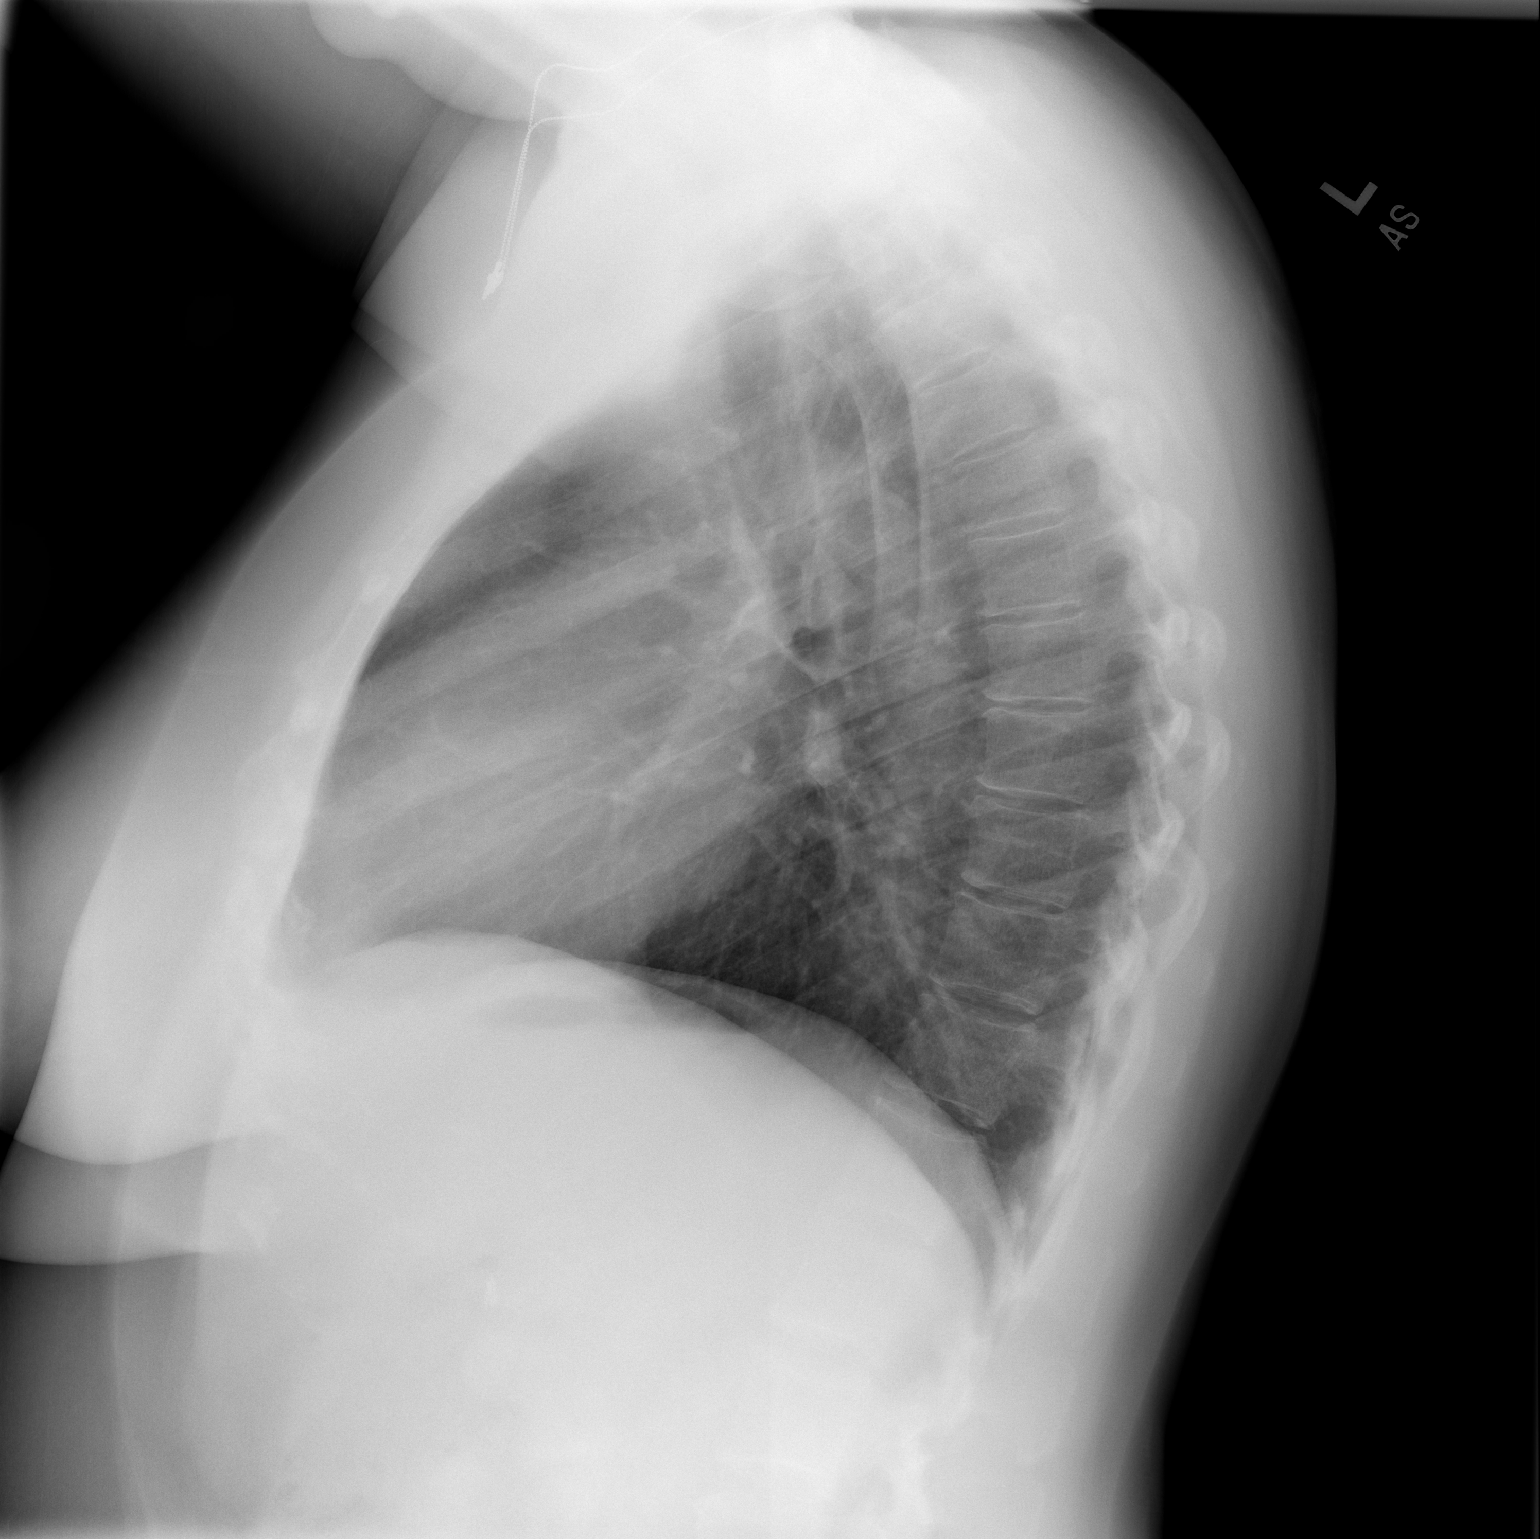

[2 of 2 positions shown; findings below may reference images not displayed]

FINDINGS: Heart and mediastinal contours are within normal limits. No focal
opacities or effusions. No acute bony abnormality.
IMPRESSION: No active cardiopulmonary disease.

## 2018-07-19 ENCOUNTER — Telehealth: Payer: Self-pay | Admitting: Allergy

## 2018-07-19 NOTE — Telephone Encounter (Signed)
Left message for patient to call office about the Auvi-Q. Phone number [phone].

## 2018-07-21 ENCOUNTER — Telehealth: Payer: Self-pay | Admitting: *Deleted

## 2018-07-21 NOTE — Telephone Encounter (Signed)
Per Dr Marlou Porch - pt needs new patient appt for the evaluation of chest pain/shoulder pain.  Pt is in [place] at the present time.  Will attempt to contact to schedule appt for when she returns.

## 2018-07-25 NOTE — Telephone Encounter (Signed)
Left message for pt to c/b to schedule as requested.

## 2018-08-01 NOTE — Telephone Encounter (Signed)
Informed patient to call [facility] about the Auvi-Q.

## 2018-08-04 NOTE — Telephone Encounter (Signed)
Pt has not returned call to schedule appointment for evaluation.  Will close encounter and await a return call from her to schedule.

## 2018-09-15 DIAGNOSIS — Z20828 Contact with and (suspected) exposure to other viral communicable diseases: Secondary | ICD-10-CM | POA: Diagnosis not present

## 2018-09-15 DIAGNOSIS — Z1159 Encounter for screening for other viral diseases: Secondary | ICD-10-CM | POA: Diagnosis not present

## 2018-09-17 DIAGNOSIS — J988 Other specified respiratory disorders: Secondary | ICD-10-CM | POA: Diagnosis not present

## 2018-10-05 ENCOUNTER — Other Ambulatory Visit: Payer: Self-pay | Admitting: Obstetrics and Gynecology

## 2018-10-05 DIAGNOSIS — R928 Other abnormal and inconclusive findings on diagnostic imaging of breast: Secondary | ICD-10-CM

## 2018-10-06 ENCOUNTER — Ambulatory Visit: Payer: 59 | Admitting: Allergy & Immunology

## 2018-10-06 DIAGNOSIS — J309 Allergic rhinitis, unspecified: Secondary | ICD-10-CM

## 2018-10-18 DIAGNOSIS — J209 Acute bronchitis, unspecified: Secondary | ICD-10-CM | POA: Diagnosis not present

## 2018-11-04 DIAGNOSIS — D225 Melanocytic nevi of trunk: Secondary | ICD-10-CM | POA: Diagnosis not present

## 2018-11-04 DIAGNOSIS — L814 Other melanin hyperpigmentation: Secondary | ICD-10-CM | POA: Diagnosis not present

## 2018-11-04 DIAGNOSIS — D1801 Hemangioma of skin and subcutaneous tissue: Secondary | ICD-10-CM | POA: Diagnosis not present

## 2018-11-04 DIAGNOSIS — L820 Inflamed seborrheic keratosis: Secondary | ICD-10-CM | POA: Diagnosis not present

## 2018-11-04 DIAGNOSIS — L738 Other specified follicular disorders: Secondary | ICD-10-CM | POA: Diagnosis not present

## 2018-11-04 DIAGNOSIS — L821 Other seborrheic keratosis: Secondary | ICD-10-CM | POA: Diagnosis not present

## 2019-01-06 ENCOUNTER — Ambulatory Visit
Admission: RE | Admit: 2019-01-06 | Discharge: 2019-01-06 | Disposition: A | Discharge status: Home / Self Care | Payer: 59 | Source: Ambulatory Visit | Attending: Obstetrics and Gynecology | Admitting: Obstetrics and Gynecology

## 2019-01-06 ENCOUNTER — Other Ambulatory Visit: Payer: Self-pay

## 2019-01-06 ENCOUNTER — Other Ambulatory Visit: Payer: Self-pay | Admitting: Obstetrics and Gynecology

## 2019-01-06 DIAGNOSIS — R928 Other abnormal and inconclusive findings on diagnostic imaging of breast: Secondary | ICD-10-CM

## 2019-01-06 DIAGNOSIS — R921 Mammographic calcification found on diagnostic imaging of breast: Secondary | ICD-10-CM | POA: Diagnosis not present

## 2019-01-06 IMAGING — MG DIGITAL DIAGNOSTIC UNILATERAL LEFT MAMMOGRAM WITH TOMO AND CAD
7 series · 8 of 15 positions shown · non-contrast
Comparison: Previous exam(s).

CLINICAL DATA: 47-year-old female status post benign left breast
biopsy in [DATE] presents for follow-up of additional,
probably benign calcifications.

EXAM:
DIGITAL DIAGNOSTIC UNILATERAL LEFT MAMMOGRAM WITH CAD AND TOMO

[L ML (1 of 2)]
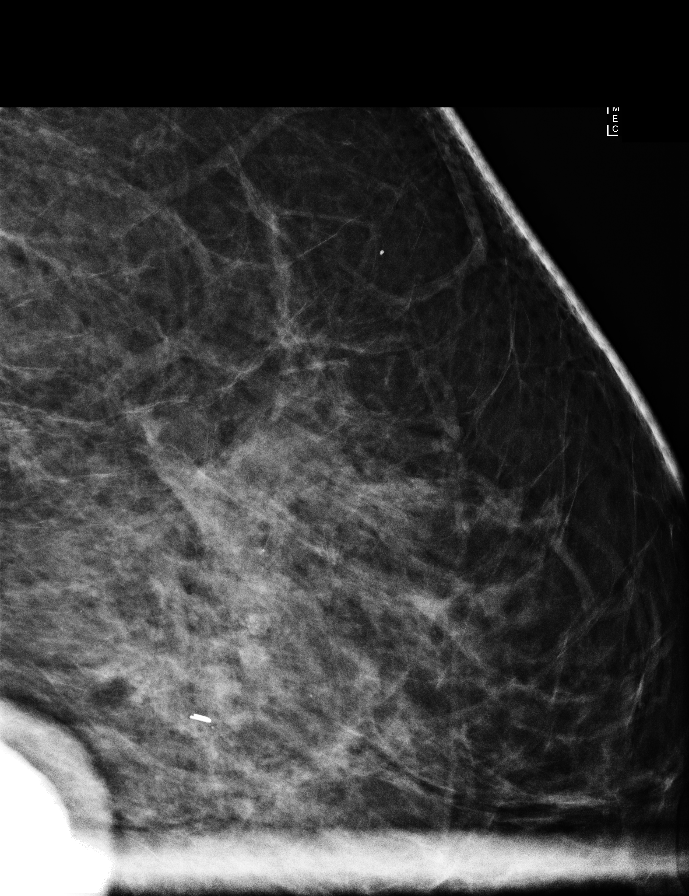

[L ML (2 of 2)]
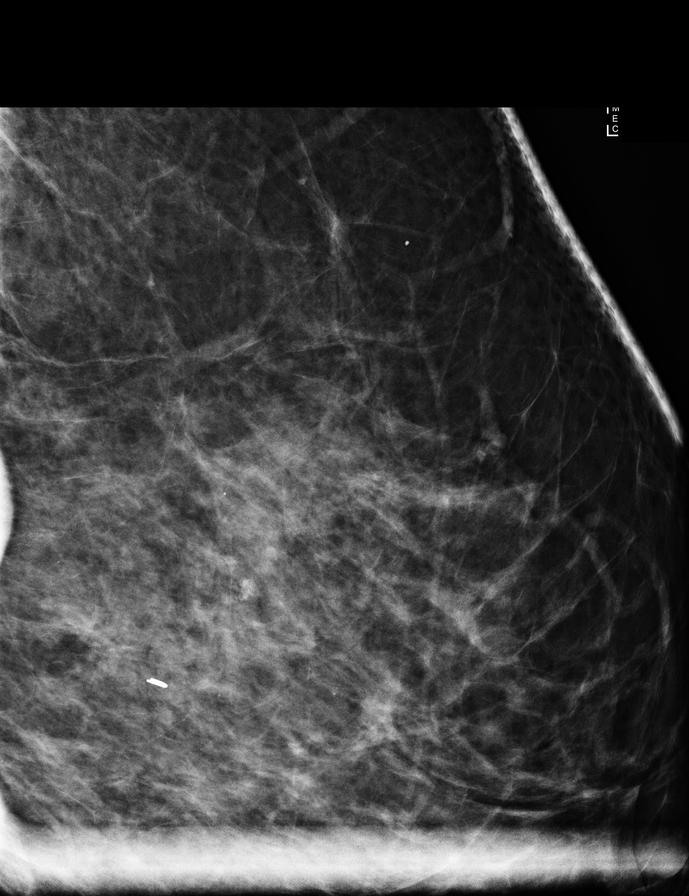

[L CC]
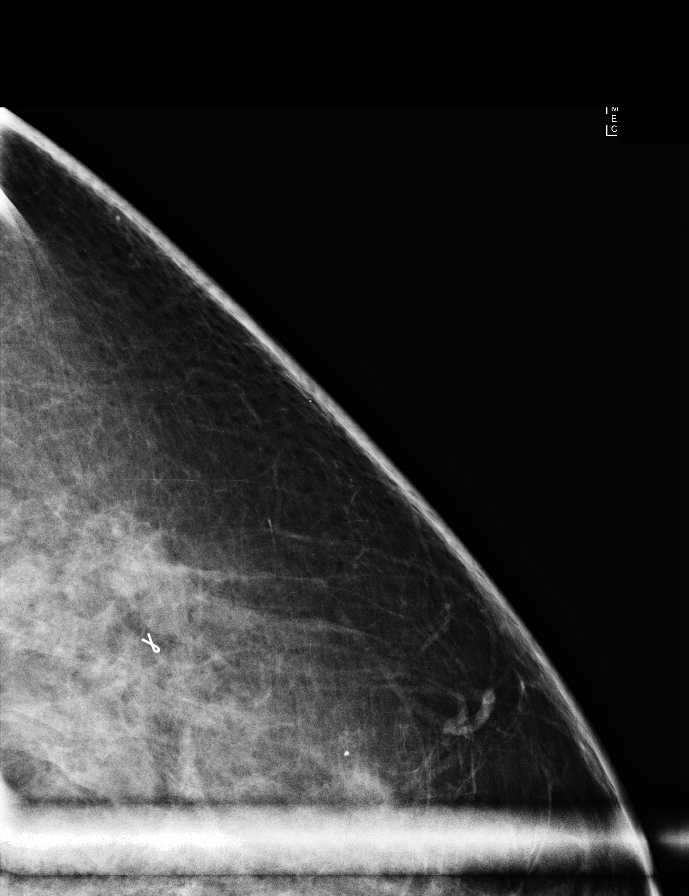

[L CC synth-2D]
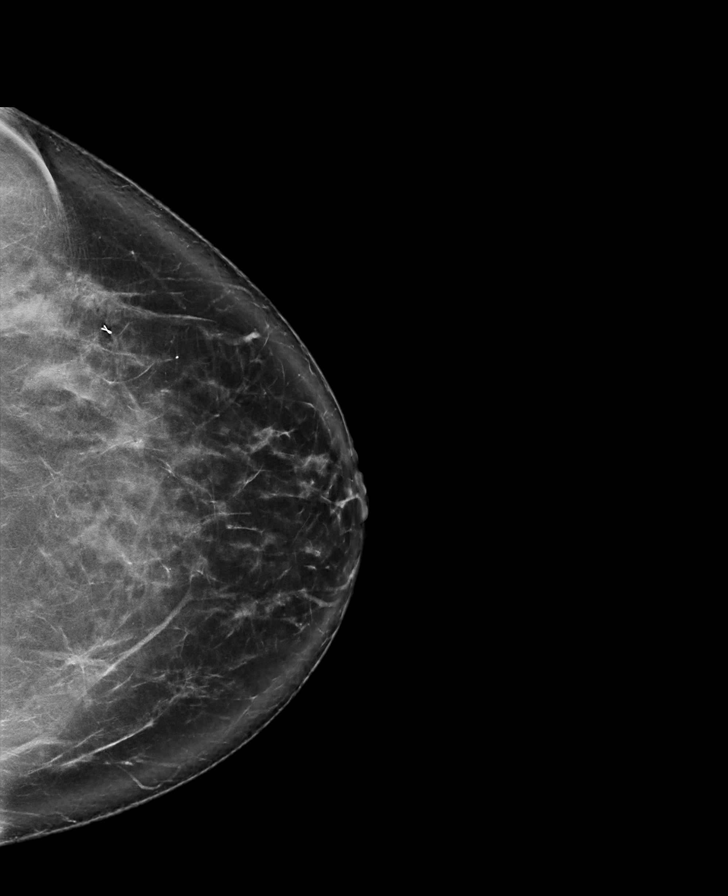

[L MLO synth-2D]
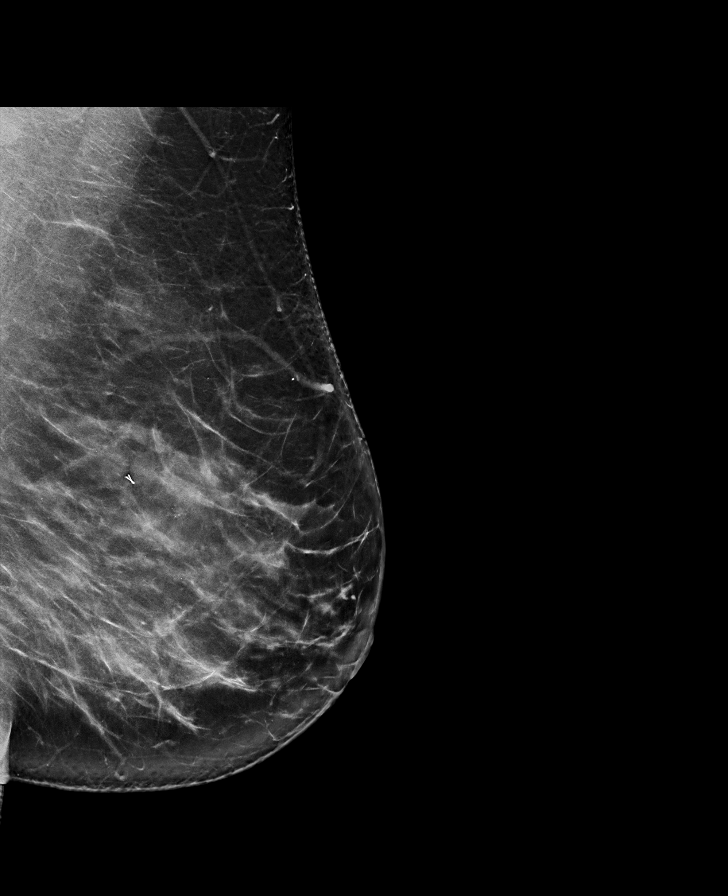

[L MLO tomo · 2 of 97 frames shown]
[frame 32/97]
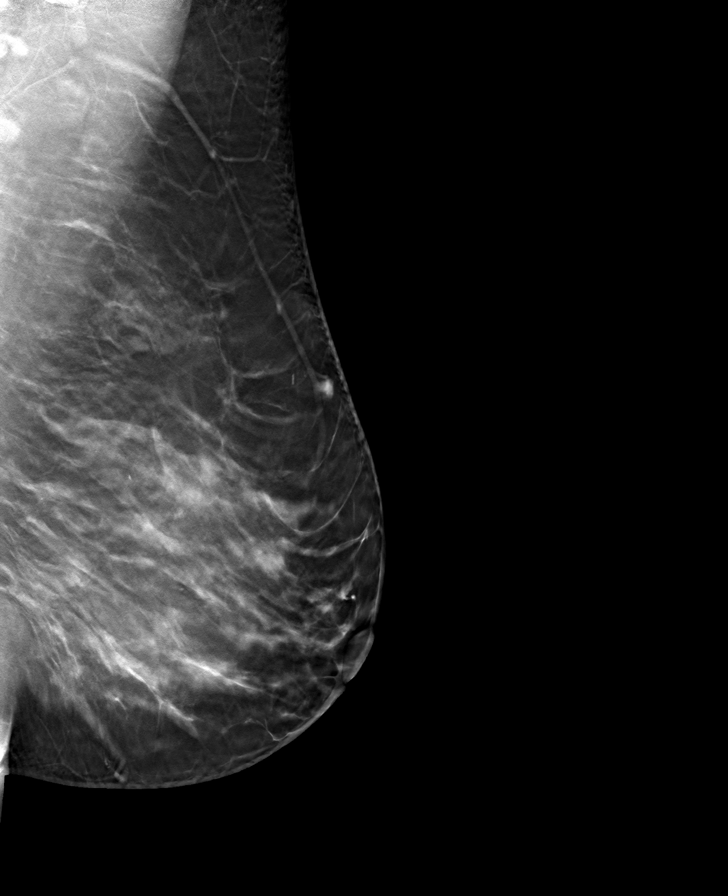
[frame 49/97]
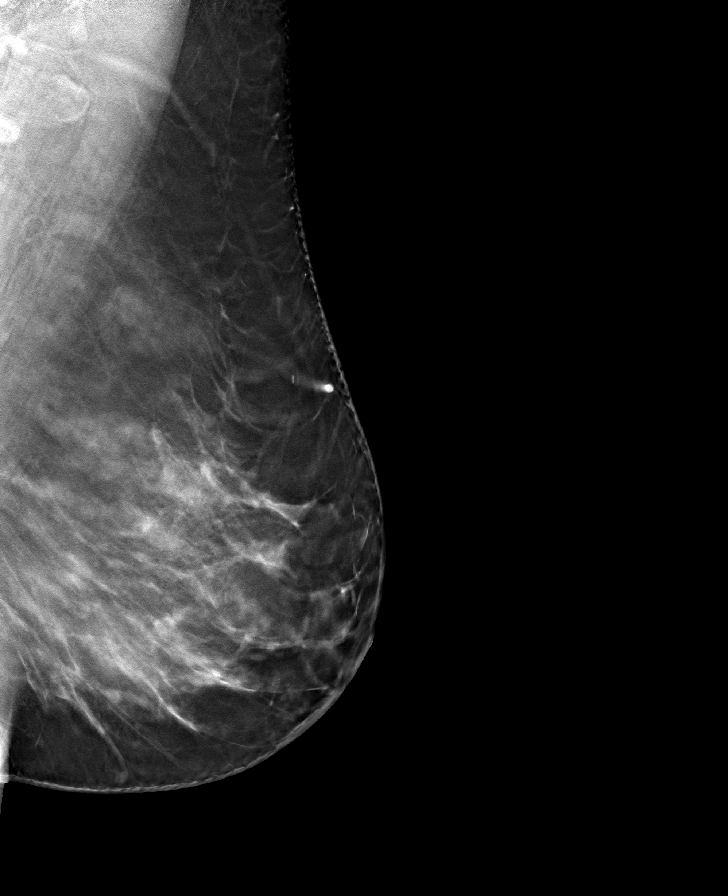

[L CC tomo · tomo slice 51/100.0]
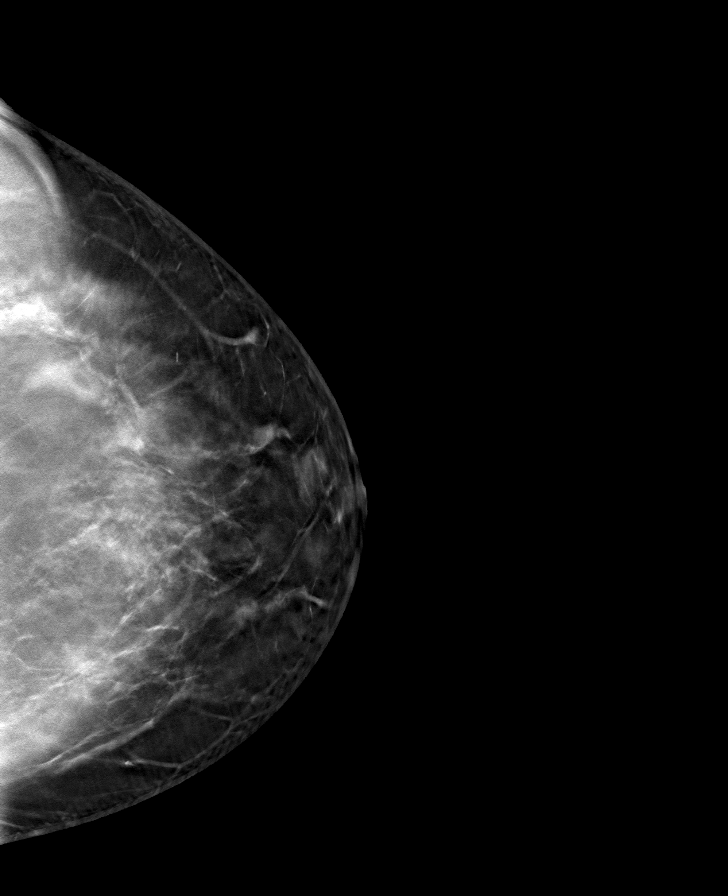

[8 of 15 positions shown; findings below may reference images not displayed]

ACR Breast Density Category c: The breast tissue is heterogeneously
dense, which may obscure small masses.
FINDINGS: Post biopsy clip is identified in the upper outer left breast, the
site of the patient's ultrasound guided biopsy. Scattered punctate
calcifications over the upper outer left breast appear
mammographically stable. No additional suspicious findings are
identified.

Mammographic images were processed with CAD.
IMPRESSION: Stable, probably benign left breast calcifications. Recommendation
is for continued imaging follow-up at the time of the patient's
annual bilateral mammogram in [DATE].

RECOMMENDATION:
Bilateral diagnostic mammogram in [DATE].

I have discussed the findings and recommendations with the patient.
Results were also provided in writing at the conclusion of the
visit. If applicable, a reminder letter will be sent to the patient
regarding the next appointment.

BI-RADS CATEGORY  3: Probably benign.

## 2019-04-13 DIAGNOSIS — M722 Plantar fascial fibromatosis: Secondary | ICD-10-CM | POA: Diagnosis not present

## 2019-04-13 DIAGNOSIS — M71571 Other bursitis, not elsewhere classified, right ankle and foot: Secondary | ICD-10-CM | POA: Diagnosis not present

## 2019-04-13 DIAGNOSIS — M7731 Calcaneal spur, right foot: Secondary | ICD-10-CM | POA: Diagnosis not present

## 2019-04-21 ENCOUNTER — Other Ambulatory Visit: Payer: Self-pay | Admitting: Obstetrics and Gynecology

## 2019-04-21 DIAGNOSIS — M71571 Other bursitis, not elsewhere classified, right ankle and foot: Secondary | ICD-10-CM | POA: Diagnosis not present

## 2019-04-21 DIAGNOSIS — M722 Plantar fascial fibromatosis: Secondary | ICD-10-CM | POA: Diagnosis not present

## 2019-04-21 DIAGNOSIS — R921 Mammographic calcification found on diagnostic imaging of breast: Secondary | ICD-10-CM

## 2019-05-02 ENCOUNTER — Ambulatory Visit
Admission: RE | Admit: 2019-05-02 | Discharge: 2019-05-02 | Disposition: A | Discharge status: Home / Self Care | Payer: BC Managed Care – PPO | Source: Ambulatory Visit | Attending: Obstetrics and Gynecology | Admitting: Obstetrics and Gynecology

## 2019-05-02 ENCOUNTER — Other Ambulatory Visit: Payer: Self-pay

## 2019-05-02 DIAGNOSIS — R921 Mammographic calcification found on diagnostic imaging of breast: Secondary | ICD-10-CM

## 2019-05-02 DIAGNOSIS — R920 Mammographic microcalcification found on diagnostic imaging of breast: Secondary | ICD-10-CM | POA: Diagnosis not present

## 2019-06-05 ENCOUNTER — Encounter: Payer: Self-pay | Admitting: Family Medicine

## 2019-06-05 ENCOUNTER — Ambulatory Visit (INDEPENDENT_AMBULATORY_CARE_PROVIDER_SITE_OTHER): Payer: BC Managed Care – PPO | Admitting: Family Medicine

## 2019-06-05 ENCOUNTER — Other Ambulatory Visit: Payer: Self-pay

## 2019-06-05 DIAGNOSIS — R42 Dizziness and giddiness: Secondary | ICD-10-CM | POA: Diagnosis not present

## 2019-06-05 DIAGNOSIS — M542 Cervicalgia: Secondary | ICD-10-CM | POA: Diagnosis not present

## 2019-06-05 MED ORDER — TIZANIDINE HCL 2 MG PO TABS: 2.0000 mg | ORAL_TABLET | Freq: Every evening | ORAL | 1 refills | Status: DC | PRN

## 2019-06-05 MED ORDER — METHYLPREDNISOLONE 4 MG PO TBPK: ORAL_TABLET | ORAL | 0 refills | Status: DC

## 2019-06-05 NOTE — Progress Notes (Signed)
Office Visit Note   Patient: Tracy Dyer           Date of Birth: 1971/04/06           MRN: [id number] Visit Date: 06/05/2019 Requested by: [name], MD [address],  [place] [address] PCP: [name], MD  Subjective: Chief Complaint  Patient presents with  . Neck - Pain    Right-sided neck pain x at least 1 1&1/2 weeks. Has been "clenching" her neck/shoulder/arm with working. Gets dizzy if she looks up.    HPI: She is here with worsening neck and right arm pain.  She has a known history of partial rotator cuff tear in the right shoulder.  Last year we gave her a subacromial injection but unfortunately it really did not help much.  She has been dealing with her pain but in the past week and a half she has taken a turn for the worse, now she has vertigo when she looks upward and she has severe right-sided neck pain with some radiation into the right arm.  She has occasional tingling in her fingers.  She also notices occasional tingling in her legs.  She has chronically bad posture.  She would like to make changes to her posture but would like some help with this.  She had a cervical MRI scan a couple years ago showing no disc problems but we have not seen the actual images.  Apparently her sister had cervical discectomy and fusion a couple weeks ago because of a disc pressing the spinal cord.              ROS: No fevers or chills.  All other systems were reviewed and are negative.  Objective: Vital Signs: There were no vitals taken for this visit.  Physical Exam:  General:  Alert and oriented, in no acute distress. Pulm:  Breathing unlabored. Psy:  Normal mood, congruent affect. Skin: No rash. Neck: Tender to palpation in the right-sided C3-4 area paraspinous muscles.  Full range of motion of the shoulders, upper extremity strength and reflexes are still normal.  Tender in the right shoulder subacromial space.  Negative Tinel's at the  carpal tunnel bilaterally but positive Phalen's test bilaterally recreating numbness in her fingers.  Imaging: None today.  Assessment & Plan: 1.  Worsening right-sided neck pain with vertigo -Referral to [facility] physical therapy for vestibular rehab and treatment of her chronic neck pain. -Zanaflex as needed for spasm. -We will order a new cervical MRI scan if she fails to improve. -She has a home cervical traction device and plans to start using it again.     Procedures: No procedures performed  No notes on file     PMFS History: Patient Active Problem List   Diagnosis Date Noted  . Anxiety 11/16/2013  . Hypercoagulable state ([place]) 11/16/2013  . TIA (transient ischemic attack) 11/15/2013  . Chest discomfort 01/20/2012  . Factor V Leiden ([place]) 01/20/2012  . MVP (mitral valve prolapse) 01/20/2012  . Acute bronchitis 12/23/2011   History reviewed. No pertinent past medical history.  Family History  Problem Relation Age of Onset  . Eczema Father   . Allergic rhinitis Brother     Past Surgical History:  Procedure Laterality Date  . BREAST EXCISIONAL BIOPSY Left   . CHOLECYSTECTOMY  2008  . WISDOM TOOTH EXTRACTION     Social History   Occupational History  . Not on file  Tobacco Use  .  Smoking status: Never Smoker  . Smokeless tobacco: Never Used  Substance and Sexual Activity  . Alcohol use: Yes    Frequency: Never  . Drug use: Never  . Sexual activity: Not on file

## 2019-06-08 DIAGNOSIS — R519 Headache, unspecified: Secondary | ICD-10-CM | POA: Diagnosis not present

## 2019-06-08 DIAGNOSIS — R42 Dizziness and giddiness: Secondary | ICD-10-CM | POA: Diagnosis not present

## 2019-06-08 DIAGNOSIS — H8113 Benign paroxysmal vertigo, bilateral: Secondary | ICD-10-CM | POA: Diagnosis not present

## 2019-06-08 DIAGNOSIS — M47892 Other spondylosis, cervical region: Secondary | ICD-10-CM | POA: Diagnosis not present

## 2019-06-13 DIAGNOSIS — R42 Dizziness and giddiness: Secondary | ICD-10-CM | POA: Diagnosis not present

## 2019-06-13 DIAGNOSIS — H8113 Benign paroxysmal vertigo, bilateral: Secondary | ICD-10-CM | POA: Diagnosis not present

## 2019-06-13 DIAGNOSIS — R519 Headache, unspecified: Secondary | ICD-10-CM | POA: Diagnosis not present

## 2019-06-13 DIAGNOSIS — M47892 Other spondylosis, cervical region: Secondary | ICD-10-CM | POA: Diagnosis not present

## 2019-06-21 DIAGNOSIS — Z124 Encounter for screening for malignant neoplasm of cervix: Secondary | ICD-10-CM | POA: Diagnosis not present

## 2019-06-21 DIAGNOSIS — R519 Headache, unspecified: Secondary | ICD-10-CM | POA: Diagnosis not present

## 2019-06-21 DIAGNOSIS — Z13220 Encounter for screening for lipoid disorders: Secondary | ICD-10-CM | POA: Diagnosis not present

## 2019-06-21 DIAGNOSIS — M47892 Other spondylosis, cervical region: Secondary | ICD-10-CM | POA: Diagnosis not present

## 2019-06-21 DIAGNOSIS — Z130 Encounter for screening for diseases of the blood and blood-forming organs and certain disorders involving the immune mechanism: Secondary | ICD-10-CM | POA: Diagnosis not present

## 2019-06-21 DIAGNOSIS — H8113 Benign paroxysmal vertigo, bilateral: Secondary | ICD-10-CM | POA: Diagnosis not present

## 2019-06-21 DIAGNOSIS — Z01419 Encounter for gynecological examination (general) (routine) without abnormal findings: Secondary | ICD-10-CM | POA: Diagnosis not present

## 2019-06-21 DIAGNOSIS — Z1329 Encounter for screening for other suspected endocrine disorder: Secondary | ICD-10-CM | POA: Diagnosis not present

## 2019-06-21 DIAGNOSIS — Z6834 Body mass index (BMI) 34.0-34.9, adult: Secondary | ICD-10-CM | POA: Diagnosis not present

## 2019-06-21 DIAGNOSIS — R42 Dizziness and giddiness: Secondary | ICD-10-CM | POA: Diagnosis not present

## 2019-06-23 DIAGNOSIS — R519 Headache, unspecified: Secondary | ICD-10-CM | POA: Diagnosis not present

## 2019-06-23 DIAGNOSIS — M47892 Other spondylosis, cervical region: Secondary | ICD-10-CM | POA: Diagnosis not present

## 2019-06-23 DIAGNOSIS — R42 Dizziness and giddiness: Secondary | ICD-10-CM | POA: Diagnosis not present

## 2019-06-23 DIAGNOSIS — H8113 Benign paroxysmal vertigo, bilateral: Secondary | ICD-10-CM | POA: Diagnosis not present

## 2019-06-26 DIAGNOSIS — R519 Headache, unspecified: Secondary | ICD-10-CM | POA: Diagnosis not present

## 2019-06-26 DIAGNOSIS — H8113 Benign paroxysmal vertigo, bilateral: Secondary | ICD-10-CM | POA: Diagnosis not present

## 2019-06-26 DIAGNOSIS — R42 Dizziness and giddiness: Secondary | ICD-10-CM | POA: Diagnosis not present

## 2019-06-26 DIAGNOSIS — M47892 Other spondylosis, cervical region: Secondary | ICD-10-CM | POA: Diagnosis not present

## 2019-06-28 DIAGNOSIS — R519 Headache, unspecified: Secondary | ICD-10-CM | POA: Diagnosis not present

## 2019-06-28 DIAGNOSIS — M47892 Other spondylosis, cervical region: Secondary | ICD-10-CM | POA: Diagnosis not present

## 2019-06-28 DIAGNOSIS — H8113 Benign paroxysmal vertigo, bilateral: Secondary | ICD-10-CM | POA: Diagnosis not present

## 2019-06-28 DIAGNOSIS — R42 Dizziness and giddiness: Secondary | ICD-10-CM | POA: Diagnosis not present

## 2019-07-02 DIAGNOSIS — Z20828 Contact with and (suspected) exposure to other viral communicable diseases: Secondary | ICD-10-CM | POA: Diagnosis not present

## 2019-07-12 DIAGNOSIS — N3000 Acute cystitis without hematuria: Secondary | ICD-10-CM | POA: Diagnosis not present

## 2019-07-12 DIAGNOSIS — H1132 Conjunctival hemorrhage, left eye: Secondary | ICD-10-CM | POA: Diagnosis not present

## 2019-07-12 DIAGNOSIS — R300 Dysuria: Secondary | ICD-10-CM | POA: Diagnosis not present

## 2019-07-12 DIAGNOSIS — H6983 Other specified disorders of Eustachian tube, bilateral: Secondary | ICD-10-CM | POA: Diagnosis not present

## 2019-07-14 DIAGNOSIS — R42 Dizziness and giddiness: Secondary | ICD-10-CM | POA: Diagnosis not present

## 2019-07-14 DIAGNOSIS — M47892 Other spondylosis, cervical region: Secondary | ICD-10-CM | POA: Diagnosis not present

## 2019-07-14 DIAGNOSIS — R519 Headache, unspecified: Secondary | ICD-10-CM | POA: Diagnosis not present

## 2019-07-14 DIAGNOSIS — H8113 Benign paroxysmal vertigo, bilateral: Secondary | ICD-10-CM | POA: Diagnosis not present

## 2019-07-20 DIAGNOSIS — H8113 Benign paroxysmal vertigo, bilateral: Secondary | ICD-10-CM | POA: Diagnosis not present

## 2019-07-20 DIAGNOSIS — R42 Dizziness and giddiness: Secondary | ICD-10-CM | POA: Diagnosis not present

## 2019-07-20 DIAGNOSIS — M47892 Other spondylosis, cervical region: Secondary | ICD-10-CM | POA: Diagnosis not present

## 2019-07-20 DIAGNOSIS — R519 Headache, unspecified: Secondary | ICD-10-CM | POA: Diagnosis not present

## 2019-07-24 DIAGNOSIS — R140 Abdominal distension (gaseous): Secondary | ICD-10-CM | POA: Diagnosis not present

## 2019-07-24 DIAGNOSIS — Z6834 Body mass index (BMI) 34.0-34.9, adult: Secondary | ICD-10-CM | POA: Diagnosis not present

## 2019-07-26 DIAGNOSIS — R42 Dizziness and giddiness: Secondary | ICD-10-CM | POA: Diagnosis not present

## 2019-07-26 DIAGNOSIS — H8113 Benign paroxysmal vertigo, bilateral: Secondary | ICD-10-CM | POA: Diagnosis not present

## 2019-07-26 DIAGNOSIS — R519 Headache, unspecified: Secondary | ICD-10-CM | POA: Diagnosis not present

## 2019-07-26 DIAGNOSIS — M47892 Other spondylosis, cervical region: Secondary | ICD-10-CM | POA: Diagnosis not present

## 2019-08-02 DIAGNOSIS — E559 Vitamin D deficiency, unspecified: Secondary | ICD-10-CM | POA: Diagnosis not present

## 2019-08-02 DIAGNOSIS — R42 Dizziness and giddiness: Secondary | ICD-10-CM | POA: Diagnosis not present

## 2019-08-02 DIAGNOSIS — R519 Headache, unspecified: Secondary | ICD-10-CM | POA: Diagnosis not present

## 2019-08-02 DIAGNOSIS — H8113 Benign paroxysmal vertigo, bilateral: Secondary | ICD-10-CM | POA: Diagnosis not present

## 2019-08-02 DIAGNOSIS — M47892 Other spondylosis, cervical region: Secondary | ICD-10-CM | POA: Diagnosis not present

## 2019-08-02 DIAGNOSIS — F411 Generalized anxiety disorder: Secondary | ICD-10-CM | POA: Diagnosis not present

## 2019-08-07 ENCOUNTER — Other Ambulatory Visit: Payer: Self-pay | Admitting: Family Medicine

## 2019-08-18 DIAGNOSIS — H8113 Benign paroxysmal vertigo, bilateral: Secondary | ICD-10-CM | POA: Diagnosis not present

## 2019-08-18 DIAGNOSIS — M47892 Other spondylosis, cervical region: Secondary | ICD-10-CM | POA: Diagnosis not present

## 2019-08-18 DIAGNOSIS — R42 Dizziness and giddiness: Secondary | ICD-10-CM | POA: Diagnosis not present

## 2019-08-18 DIAGNOSIS — R519 Headache, unspecified: Secondary | ICD-10-CM | POA: Diagnosis not present

## 2019-08-25 DIAGNOSIS — R42 Dizziness and giddiness: Secondary | ICD-10-CM | POA: Diagnosis not present

## 2019-08-25 DIAGNOSIS — M47892 Other spondylosis, cervical region: Secondary | ICD-10-CM | POA: Diagnosis not present

## 2019-08-25 DIAGNOSIS — R519 Headache, unspecified: Secondary | ICD-10-CM | POA: Diagnosis not present

## 2019-08-25 DIAGNOSIS — H8113 Benign paroxysmal vertigo, bilateral: Secondary | ICD-10-CM | POA: Diagnosis not present

## 2019-09-01 DIAGNOSIS — M47892 Other spondylosis, cervical region: Secondary | ICD-10-CM | POA: Diagnosis not present

## 2019-09-01 DIAGNOSIS — R519 Headache, unspecified: Secondary | ICD-10-CM | POA: Diagnosis not present

## 2019-09-01 DIAGNOSIS — R42 Dizziness and giddiness: Secondary | ICD-10-CM | POA: Diagnosis not present

## 2019-09-01 DIAGNOSIS — H8113 Benign paroxysmal vertigo, bilateral: Secondary | ICD-10-CM | POA: Diagnosis not present

## 2019-09-22 DIAGNOSIS — R519 Headache, unspecified: Secondary | ICD-10-CM | POA: Diagnosis not present

## 2019-09-22 DIAGNOSIS — H8113 Benign paroxysmal vertigo, bilateral: Secondary | ICD-10-CM | POA: Diagnosis not present

## 2019-09-22 DIAGNOSIS — M47892 Other spondylosis, cervical region: Secondary | ICD-10-CM | POA: Diagnosis not present

## 2019-09-22 DIAGNOSIS — R42 Dizziness and giddiness: Secondary | ICD-10-CM | POA: Diagnosis not present

## 2019-09-29 DIAGNOSIS — M47892 Other spondylosis, cervical region: Secondary | ICD-10-CM | POA: Diagnosis not present

## 2019-09-29 DIAGNOSIS — R42 Dizziness and giddiness: Secondary | ICD-10-CM | POA: Diagnosis not present

## 2019-09-29 DIAGNOSIS — R519 Headache, unspecified: Secondary | ICD-10-CM | POA: Diagnosis not present

## 2019-09-29 DIAGNOSIS — H8113 Benign paroxysmal vertigo, bilateral: Secondary | ICD-10-CM | POA: Diagnosis not present

## 2019-10-06 DIAGNOSIS — H8113 Benign paroxysmal vertigo, bilateral: Secondary | ICD-10-CM | POA: Diagnosis not present

## 2019-10-06 DIAGNOSIS — R519 Headache, unspecified: Secondary | ICD-10-CM | POA: Diagnosis not present

## 2019-10-06 DIAGNOSIS — M47892 Other spondylosis, cervical region: Secondary | ICD-10-CM | POA: Diagnosis not present

## 2019-10-06 DIAGNOSIS — R42 Dizziness and giddiness: Secondary | ICD-10-CM | POA: Diagnosis not present

## 2019-10-20 DIAGNOSIS — R42 Dizziness and giddiness: Secondary | ICD-10-CM | POA: Diagnosis not present

## 2019-10-20 DIAGNOSIS — R519 Headache, unspecified: Secondary | ICD-10-CM | POA: Diagnosis not present

## 2019-10-20 DIAGNOSIS — H8113 Benign paroxysmal vertigo, bilateral: Secondary | ICD-10-CM | POA: Diagnosis not present

## 2019-10-20 DIAGNOSIS — M47892 Other spondylosis, cervical region: Secondary | ICD-10-CM | POA: Diagnosis not present

## 2019-10-24 ENCOUNTER — Encounter: Payer: Self-pay | Admitting: Family Medicine

## 2019-10-24 DIAGNOSIS — H6980 Other specified disorders of Eustachian tube, unspecified ear: Secondary | ICD-10-CM | POA: Diagnosis not present

## 2019-10-26 ENCOUNTER — Ambulatory Visit: Payer: Self-pay | Attending: Internal Medicine

## 2019-10-26 DIAGNOSIS — Z23 Encounter for immunization: Secondary | ICD-10-CM

## 2019-10-26 NOTE — Progress Notes (Signed)
   Covid-19 Vaccination Clinic  Name:  Melenie Torio    MRN: [id number] DOB: [birth year]  10/26/2019  Ms. Dugue was observed post Covid-19 immunization for 15 minutes without incident. She was provided with Vaccine Information Sheet and instruction to access the V-Safe system.   Ms. Rothrock was instructed to call 911 with any severe reactions post vaccine: Marland Kitchen Difficulty breathing  . Swelling of face and throat  . A fast heartbeat  . A bad rash all over body  . Dizziness and weakness   Immunizations Administered    Name Date Dose VIS Date Route   Moderna COVID-19 Vaccine 10/26/2019 10:50 AM 0.5 mL 07/04/2019 Intramuscular   Manufacturer: Moderna   Lot: [id number]   [id number]

## 2019-11-17 DIAGNOSIS — M47892 Other spondylosis, cervical region: Secondary | ICD-10-CM | POA: Diagnosis not present

## 2019-11-17 DIAGNOSIS — R42 Dizziness and giddiness: Secondary | ICD-10-CM | POA: Diagnosis not present

## 2019-11-17 DIAGNOSIS — R519 Headache, unspecified: Secondary | ICD-10-CM | POA: Diagnosis not present

## 2019-11-17 DIAGNOSIS — H8113 Benign paroxysmal vertigo, bilateral: Secondary | ICD-10-CM | POA: Diagnosis not present

## 2019-11-23 DIAGNOSIS — M47892 Other spondylosis, cervical region: Secondary | ICD-10-CM | POA: Diagnosis not present

## 2019-11-23 DIAGNOSIS — R42 Dizziness and giddiness: Secondary | ICD-10-CM | POA: Diagnosis not present

## 2019-11-23 DIAGNOSIS — H8113 Benign paroxysmal vertigo, bilateral: Secondary | ICD-10-CM | POA: Diagnosis not present

## 2019-11-23 DIAGNOSIS — R519 Headache, unspecified: Secondary | ICD-10-CM | POA: Diagnosis not present

## 2019-11-28 ENCOUNTER — Ambulatory Visit: Payer: Self-pay | Attending: Internal Medicine

## 2019-11-28 DIAGNOSIS — Z23 Encounter for immunization: Secondary | ICD-10-CM

## 2019-11-28 NOTE — Progress Notes (Signed)
   Covid-19 Vaccination Clinic  Name:  Tracy Dyer    MRN: [id number] DOB: 1971/02/27  11/28/2019  [name] was observed post Covid-19 immunization for 15 minutes without incident. She was provided with Vaccine Information Sheet and instruction to access the V-Safe system.   [name] was instructed to call 911 with any severe reactions post vaccine: Marland Kitchen Difficulty breathing  . Swelling of face and throat  . A fast heartbeat  . A bad rash all over body  . Dizziness and weakness   Immunizations Administered    Name Date Dose VIS Date Route   Moderna COVID-19 Vaccine 11/28/2019 12:53 PM 0.5 mL 07/2019 Intramuscular   Manufacturer: Moderna   Lot: [id number]   [id number]

## 2019-11-30 DIAGNOSIS — M47892 Other spondylosis, cervical region: Secondary | ICD-10-CM | POA: Diagnosis not present

## 2019-11-30 DIAGNOSIS — R519 Headache, unspecified: Secondary | ICD-10-CM | POA: Diagnosis not present

## 2019-11-30 DIAGNOSIS — R42 Dizziness and giddiness: Secondary | ICD-10-CM | POA: Diagnosis not present

## 2019-11-30 DIAGNOSIS — H8113 Benign paroxysmal vertigo, bilateral: Secondary | ICD-10-CM | POA: Diagnosis not present

## 2019-12-14 DIAGNOSIS — S30860A Insect bite (nonvenomous) of lower back and pelvis, initial encounter: Secondary | ICD-10-CM | POA: Diagnosis not present

## 2019-12-14 DIAGNOSIS — R519 Headache, unspecified: Secondary | ICD-10-CM | POA: Diagnosis not present

## 2019-12-14 DIAGNOSIS — M47892 Other spondylosis, cervical region: Secondary | ICD-10-CM | POA: Diagnosis not present

## 2019-12-14 DIAGNOSIS — H8113 Benign paroxysmal vertigo, bilateral: Secondary | ICD-10-CM | POA: Diagnosis not present

## 2019-12-14 DIAGNOSIS — R42 Dizziness and giddiness: Secondary | ICD-10-CM | POA: Diagnosis not present

## 2019-12-29 DIAGNOSIS — R42 Dizziness and giddiness: Secondary | ICD-10-CM | POA: Diagnosis not present

## 2019-12-29 DIAGNOSIS — H8113 Benign paroxysmal vertigo, bilateral: Secondary | ICD-10-CM | POA: Diagnosis not present

## 2019-12-29 DIAGNOSIS — M47892 Other spondylosis, cervical region: Secondary | ICD-10-CM | POA: Diagnosis not present

## 2019-12-29 DIAGNOSIS — R519 Headache, unspecified: Secondary | ICD-10-CM | POA: Diagnosis not present

## 2020-01-08 DIAGNOSIS — R42 Dizziness and giddiness: Secondary | ICD-10-CM | POA: Diagnosis not present

## 2020-01-08 DIAGNOSIS — M47892 Other spondylosis, cervical region: Secondary | ICD-10-CM | POA: Diagnosis not present

## 2020-01-08 DIAGNOSIS — H8113 Benign paroxysmal vertigo, bilateral: Secondary | ICD-10-CM | POA: Diagnosis not present

## 2020-01-08 DIAGNOSIS — R519 Headache, unspecified: Secondary | ICD-10-CM | POA: Diagnosis not present

## 2020-01-26 DIAGNOSIS — H8113 Benign paroxysmal vertigo, bilateral: Secondary | ICD-10-CM | POA: Diagnosis not present

## 2020-01-26 DIAGNOSIS — R42 Dizziness and giddiness: Secondary | ICD-10-CM | POA: Diagnosis not present

## 2020-01-26 DIAGNOSIS — M47892 Other spondylosis, cervical region: Secondary | ICD-10-CM | POA: Diagnosis not present

## 2020-01-26 DIAGNOSIS — R519 Headache, unspecified: Secondary | ICD-10-CM | POA: Diagnosis not present

## 2020-01-30 DIAGNOSIS — E559 Vitamin D deficiency, unspecified: Secondary | ICD-10-CM | POA: Diagnosis not present

## 2020-01-30 DIAGNOSIS — H9203 Otalgia, bilateral: Secondary | ICD-10-CM | POA: Diagnosis not present

## 2020-01-30 DIAGNOSIS — F411 Generalized anxiety disorder: Secondary | ICD-10-CM | POA: Diagnosis not present

## 2020-01-30 DIAGNOSIS — R5383 Other fatigue: Secondary | ICD-10-CM | POA: Diagnosis not present

## 2020-02-02 DIAGNOSIS — R42 Dizziness and giddiness: Secondary | ICD-10-CM | POA: Diagnosis not present

## 2020-02-02 DIAGNOSIS — H8113 Benign paroxysmal vertigo, bilateral: Secondary | ICD-10-CM | POA: Diagnosis not present

## 2020-02-02 DIAGNOSIS — M47892 Other spondylosis, cervical region: Secondary | ICD-10-CM | POA: Diagnosis not present

## 2020-02-02 DIAGNOSIS — R519 Headache, unspecified: Secondary | ICD-10-CM | POA: Diagnosis not present

## 2020-02-08 DIAGNOSIS — R5383 Other fatigue: Secondary | ICD-10-CM | POA: Diagnosis not present

## 2020-02-08 DIAGNOSIS — E559 Vitamin D deficiency, unspecified: Secondary | ICD-10-CM | POA: Diagnosis not present

## 2020-02-14 DIAGNOSIS — H9203 Otalgia, bilateral: Secondary | ICD-10-CM | POA: Diagnosis not present

## 2020-02-14 DIAGNOSIS — H6983 Other specified disorders of Eustachian tube, bilateral: Secondary | ICD-10-CM | POA: Diagnosis not present

## 2020-02-14 DIAGNOSIS — H93293 Other abnormal auditory perceptions, bilateral: Secondary | ICD-10-CM | POA: Diagnosis not present

## 2020-02-15 DIAGNOSIS — R519 Headache, unspecified: Secondary | ICD-10-CM | POA: Diagnosis not present

## 2020-02-15 DIAGNOSIS — H8113 Benign paroxysmal vertigo, bilateral: Secondary | ICD-10-CM | POA: Diagnosis not present

## 2020-02-15 DIAGNOSIS — M47892 Other spondylosis, cervical region: Secondary | ICD-10-CM | POA: Diagnosis not present

## 2020-02-15 DIAGNOSIS — R42 Dizziness and giddiness: Secondary | ICD-10-CM | POA: Diagnosis not present

## 2020-03-01 DIAGNOSIS — H8113 Benign paroxysmal vertigo, bilateral: Secondary | ICD-10-CM | POA: Diagnosis not present

## 2020-03-01 DIAGNOSIS — M47892 Other spondylosis, cervical region: Secondary | ICD-10-CM | POA: Diagnosis not present

## 2020-03-01 DIAGNOSIS — R519 Headache, unspecified: Secondary | ICD-10-CM | POA: Diagnosis not present

## 2020-03-01 DIAGNOSIS — R42 Dizziness and giddiness: Secondary | ICD-10-CM | POA: Diagnosis not present

## 2020-03-08 DIAGNOSIS — H8113 Benign paroxysmal vertigo, bilateral: Secondary | ICD-10-CM | POA: Diagnosis not present

## 2020-03-08 DIAGNOSIS — M47892 Other spondylosis, cervical region: Secondary | ICD-10-CM | POA: Diagnosis not present

## 2020-03-08 DIAGNOSIS — R519 Headache, unspecified: Secondary | ICD-10-CM | POA: Diagnosis not present

## 2020-03-08 DIAGNOSIS — R42 Dizziness and giddiness: Secondary | ICD-10-CM | POA: Diagnosis not present

## 2020-03-21 DIAGNOSIS — Z03818 Encounter for observation for suspected exposure to other biological agents ruled out: Secondary | ICD-10-CM | POA: Diagnosis not present

## 2020-03-28 DIAGNOSIS — R519 Headache, unspecified: Secondary | ICD-10-CM | POA: Diagnosis not present

## 2020-03-28 DIAGNOSIS — M47892 Other spondylosis, cervical region: Secondary | ICD-10-CM | POA: Diagnosis not present

## 2020-03-28 DIAGNOSIS — H8113 Benign paroxysmal vertigo, bilateral: Secondary | ICD-10-CM | POA: Diagnosis not present

## 2020-03-28 DIAGNOSIS — R42 Dizziness and giddiness: Secondary | ICD-10-CM | POA: Diagnosis not present

## 2020-04-04 DIAGNOSIS — R42 Dizziness and giddiness: Secondary | ICD-10-CM | POA: Diagnosis not present

## 2020-04-04 DIAGNOSIS — M47892 Other spondylosis, cervical region: Secondary | ICD-10-CM | POA: Diagnosis not present

## 2020-04-04 DIAGNOSIS — H8113 Benign paroxysmal vertigo, bilateral: Secondary | ICD-10-CM | POA: Diagnosis not present

## 2020-04-04 DIAGNOSIS — R519 Headache, unspecified: Secondary | ICD-10-CM | POA: Diagnosis not present

## 2020-04-19 DIAGNOSIS — R05 Cough: Secondary | ICD-10-CM | POA: Diagnosis not present

## 2020-05-10 DIAGNOSIS — H6691 Otitis media, unspecified, right ear: Secondary | ICD-10-CM | POA: Diagnosis not present

## 2020-05-10 DIAGNOSIS — J209 Acute bronchitis, unspecified: Secondary | ICD-10-CM | POA: Diagnosis not present

## 2020-05-10 DIAGNOSIS — Z03818 Encounter for observation for suspected exposure to other biological agents ruled out: Secondary | ICD-10-CM | POA: Diagnosis not present

## 2020-05-20 DIAGNOSIS — K5900 Constipation, unspecified: Secondary | ICD-10-CM | POA: Diagnosis not present

## 2020-05-20 DIAGNOSIS — K625 Hemorrhage of anus and rectum: Secondary | ICD-10-CM | POA: Diagnosis not present

## 2020-05-20 DIAGNOSIS — R1013 Epigastric pain: Secondary | ICD-10-CM | POA: Diagnosis not present

## 2020-05-20 DIAGNOSIS — R1031 Right lower quadrant pain: Secondary | ICD-10-CM | POA: Diagnosis not present

## 2020-05-22 DIAGNOSIS — R1031 Right lower quadrant pain: Secondary | ICD-10-CM | POA: Diagnosis not present

## 2020-05-22 DIAGNOSIS — K5900 Constipation, unspecified: Secondary | ICD-10-CM | POA: Diagnosis not present

## 2020-05-22 DIAGNOSIS — R1013 Epigastric pain: Secondary | ICD-10-CM | POA: Diagnosis not present

## 2020-05-22 DIAGNOSIS — R110 Nausea: Secondary | ICD-10-CM | POA: Diagnosis not present

## 2020-05-22 DIAGNOSIS — K625 Hemorrhage of anus and rectum: Secondary | ICD-10-CM | POA: Diagnosis not present

## 2020-06-13 DIAGNOSIS — R519 Headache, unspecified: Secondary | ICD-10-CM | POA: Diagnosis not present

## 2020-06-13 DIAGNOSIS — H8113 Benign paroxysmal vertigo, bilateral: Secondary | ICD-10-CM | POA: Diagnosis not present

## 2020-06-13 DIAGNOSIS — M47892 Other spondylosis, cervical region: Secondary | ICD-10-CM | POA: Diagnosis not present

## 2020-06-13 DIAGNOSIS — R42 Dizziness and giddiness: Secondary | ICD-10-CM | POA: Diagnosis not present

## 2020-07-04 DIAGNOSIS — Z01419 Encounter for gynecological examination (general) (routine) without abnormal findings: Secondary | ICD-10-CM | POA: Diagnosis not present

## 2020-07-09 ENCOUNTER — Other Ambulatory Visit: Payer: Self-pay | Admitting: Obstetrics and Gynecology

## 2020-07-09 DIAGNOSIS — Z1239 Encounter for other screening for malignant neoplasm of breast: Secondary | ICD-10-CM

## 2020-07-25 DIAGNOSIS — Z20822 Contact with and (suspected) exposure to covid-19: Secondary | ICD-10-CM | POA: Diagnosis not present

## 2020-07-25 DIAGNOSIS — Z03818 Encounter for observation for suspected exposure to other biological agents ruled out: Secondary | ICD-10-CM | POA: Diagnosis not present

## 2020-08-05 DIAGNOSIS — Z8619 Personal history of other infectious and parasitic diseases: Secondary | ICD-10-CM | POA: Diagnosis not present

## 2020-08-05 DIAGNOSIS — Z1329 Encounter for screening for other suspected endocrine disorder: Secondary | ICD-10-CM | POA: Diagnosis not present

## 2020-08-05 DIAGNOSIS — K5900 Constipation, unspecified: Secondary | ICD-10-CM | POA: Diagnosis not present

## 2020-08-05 DIAGNOSIS — R635 Abnormal weight gain: Secondary | ICD-10-CM | POA: Diagnosis not present

## 2020-08-05 DIAGNOSIS — E538 Deficiency of other specified B group vitamins: Secondary | ICD-10-CM | POA: Diagnosis not present

## 2020-08-05 DIAGNOSIS — R5383 Other fatigue: Secondary | ICD-10-CM | POA: Diagnosis not present

## 2020-08-05 DIAGNOSIS — M2550 Pain in unspecified joint: Secondary | ICD-10-CM | POA: Diagnosis not present

## 2020-08-05 DIAGNOSIS — E559 Vitamin D deficiency, unspecified: Secondary | ICD-10-CM | POA: Diagnosis not present

## 2020-08-08 ENCOUNTER — Other Ambulatory Visit: Payer: Self-pay | Admitting: Obstetrics and Gynecology

## 2020-08-08 DIAGNOSIS — R921 Mammographic calcification found on diagnostic imaging of breast: Secondary | ICD-10-CM

## 2020-08-13 ENCOUNTER — Other Ambulatory Visit: Payer: Self-pay | Admitting: Obstetrics and Gynecology

## 2020-08-13 ENCOUNTER — Ambulatory Visit
Admission: RE | Admit: 2020-08-13 | Discharge: 2020-08-13 | Disposition: A | Discharge status: Home / Self Care | Payer: BC Managed Care – PPO | Source: Ambulatory Visit | Attending: Obstetrics and Gynecology | Admitting: Obstetrics and Gynecology

## 2020-08-13 ENCOUNTER — Other Ambulatory Visit: Payer: Self-pay

## 2020-08-13 DIAGNOSIS — N6320 Unspecified lump in the left breast, unspecified quadrant: Secondary | ICD-10-CM

## 2020-08-13 DIAGNOSIS — R921 Mammographic calcification found on diagnostic imaging of breast: Secondary | ICD-10-CM

## 2020-08-13 DIAGNOSIS — N6323 Unspecified lump in the left breast, lower outer quadrant: Secondary | ICD-10-CM | POA: Diagnosis not present

## 2020-08-13 DIAGNOSIS — N6321 Unspecified lump in the left breast, upper outer quadrant: Secondary | ICD-10-CM | POA: Diagnosis not present

## 2020-08-13 IMAGING — US US BREAST*L* COMPLETE INC AXILLA
1 series · 13 of 25 positions shown · non-contrast
Comparison: Previous exam(s).

CLINICAL DATA: Delayed followup for calcifications in the LEFT
breast. Today the patient feels a lump in the LEFT breast. Patient's
mother was diagnosed with breast cancer in her 50s.

EXAM:
DIGITAL DIAGNOSTIC BILATERAL MAMMOGRAM WITH CAD AND TOMO
ULTRASOUND LEFT BREAST

[Series 1: us breast*left* complete inc axilla · 0.07mm/px · 13 of 30 slices shown]
[im 1/30]
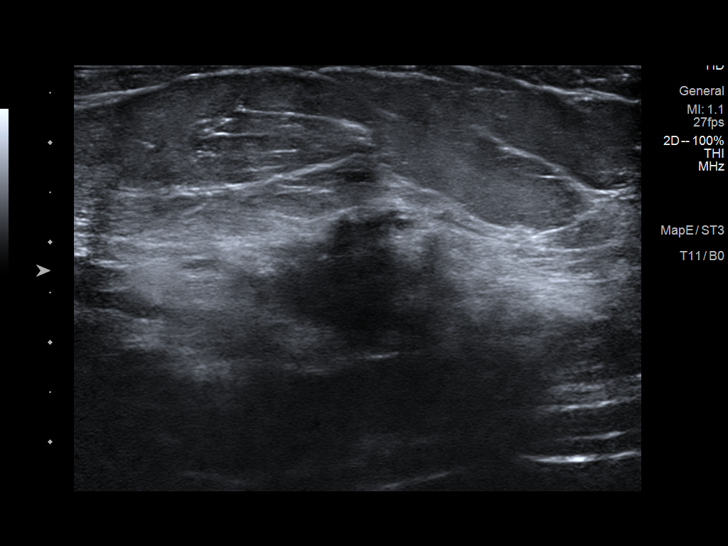
[im 3/30]
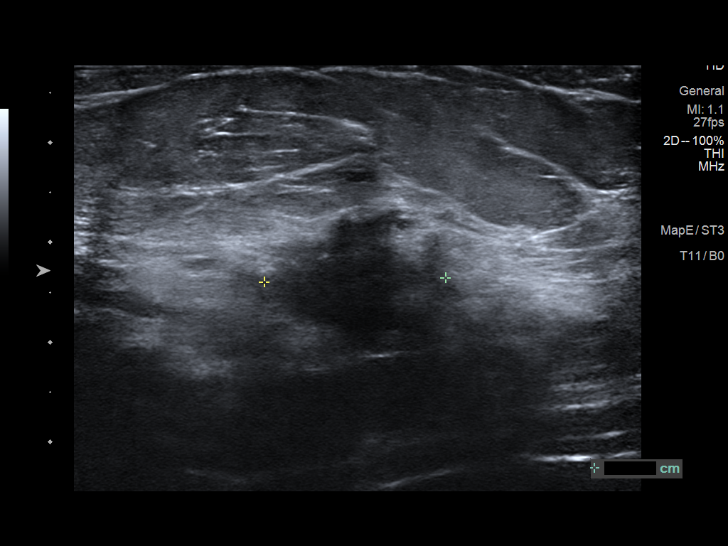
[im 5/30]
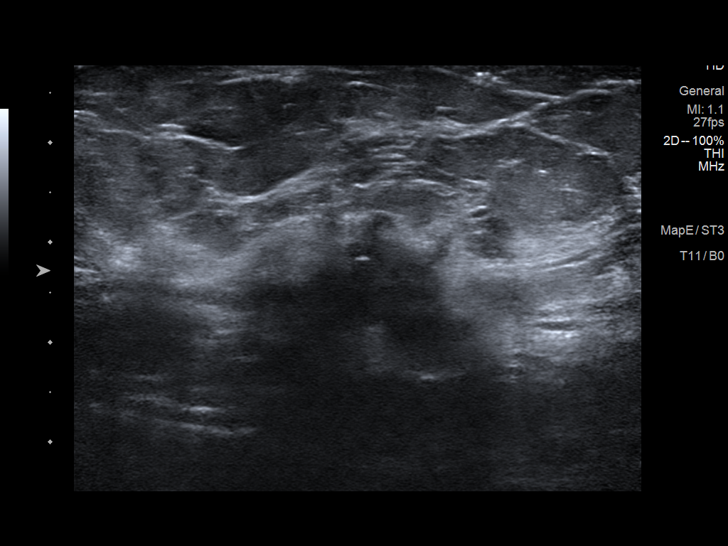
[im 8/30]
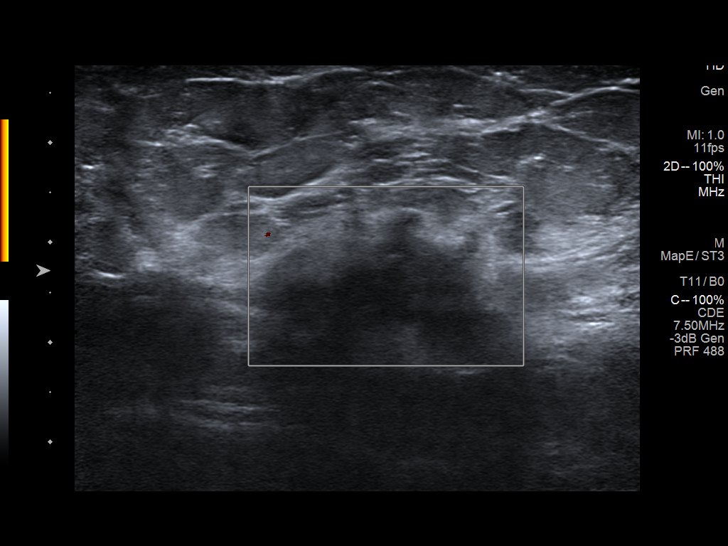
[im 10/30]
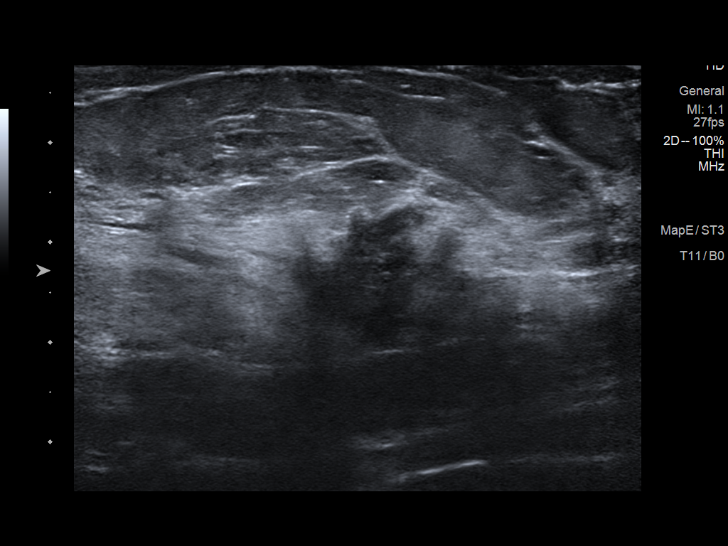
[im 13/30]
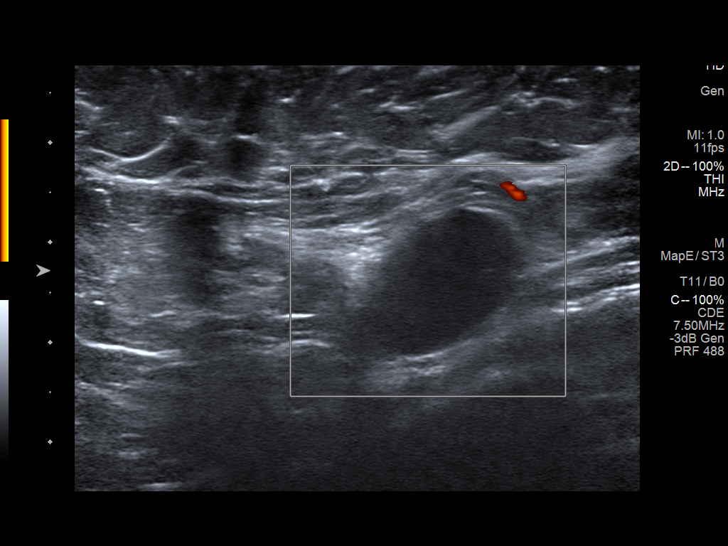
[im 15/30]
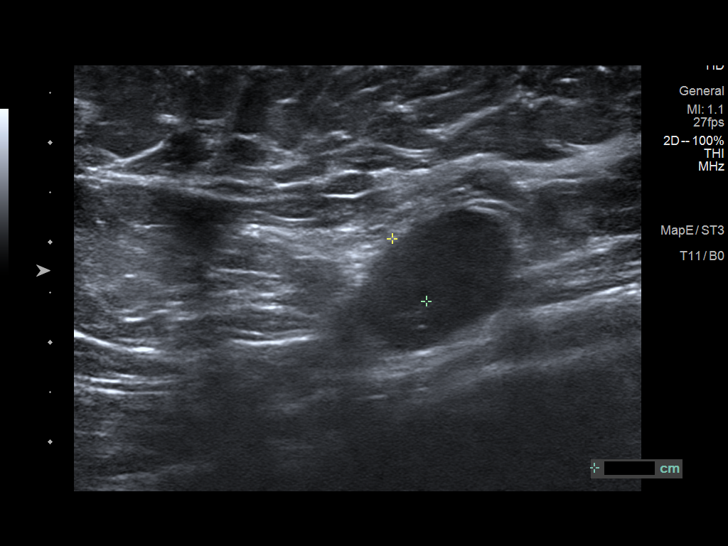
[im 17/30]
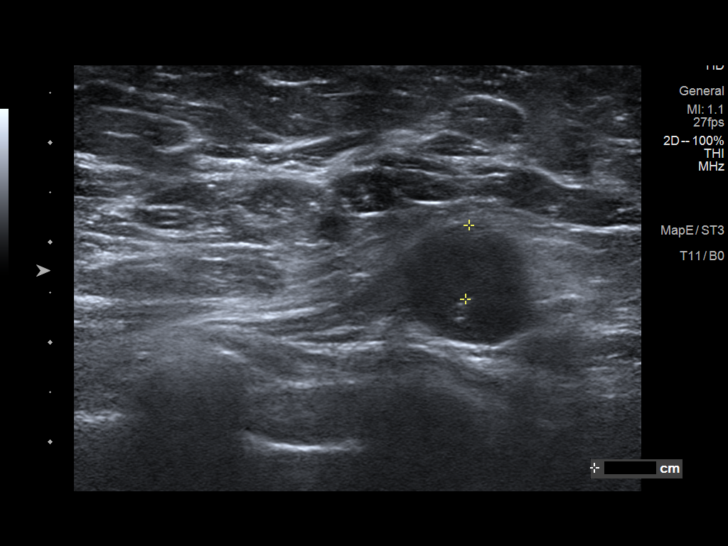
[im 20/30]
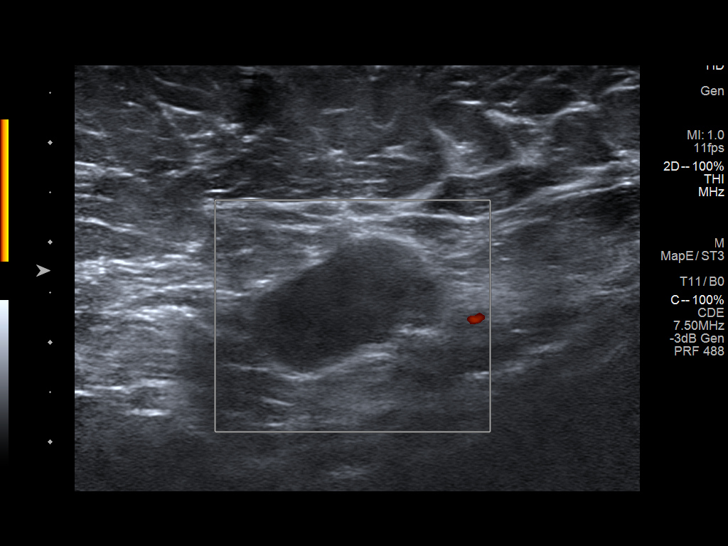
[im 22/30]
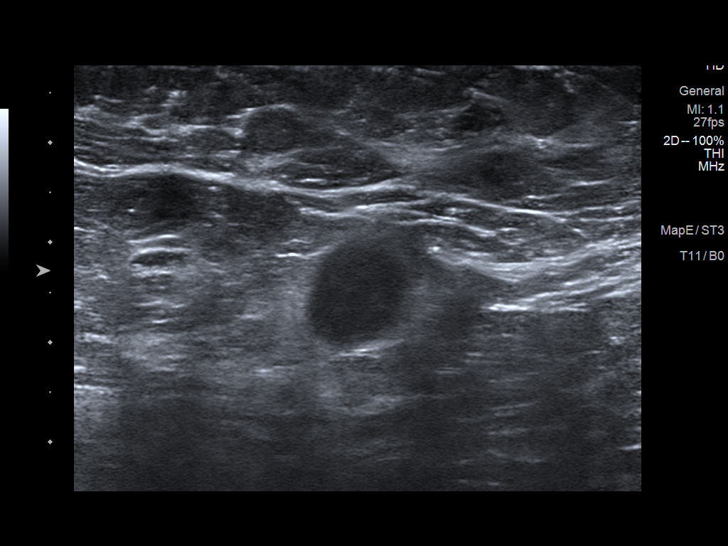
[im 25/30]
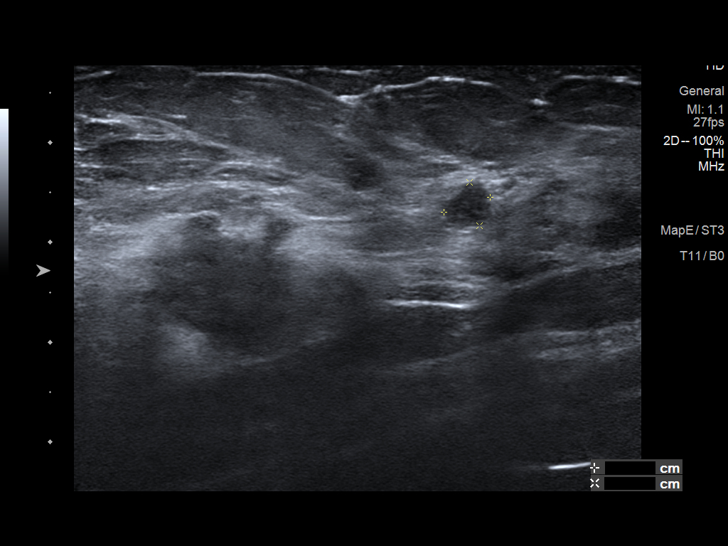
[im 27/30]
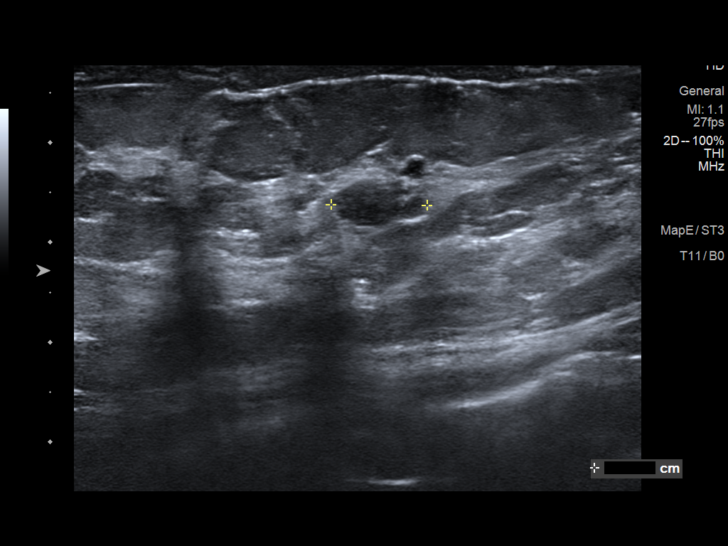
[im 30/30]
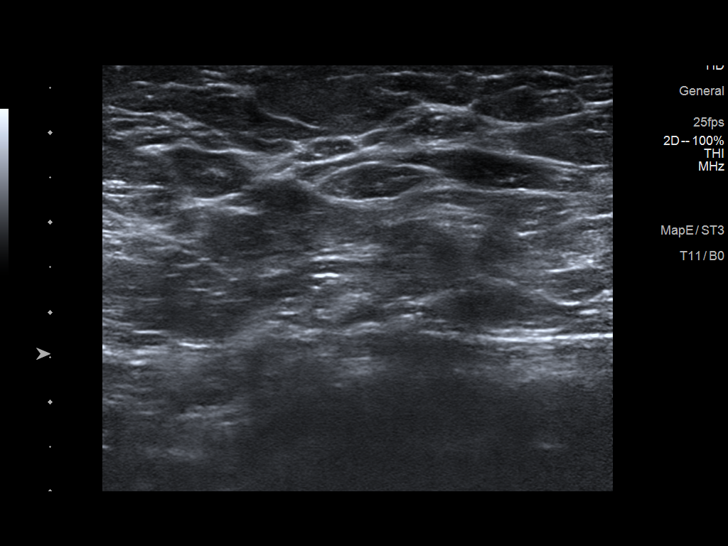

[13 of 25 positions shown; findings below may reference images not displayed]

ACR Breast Density Category c: The breast tissue is heterogeneously
dense, which may obscure small masses.
FINDINGS: RIGHT BREAST:

Mammogram: No suspicious mass, distortion, or microcalcifications
are identified to suggest presence of malignancy. mammographic
images were processed with CAD.

LEFT BREAST:

Mammogram: A group of punctate calcifications within the UPPER-OUTER
QUADRANT of the LEFT breast appears stable over multiple prior
studies. There has been interval development of a mass in the
UPPER-OUTER QUADRANT of the LEFT breast, associated with distortion.
Mass is palpable and marked with a BB. Mammographic images were
processed with CAD.

Physical Exam: I palpate a discrete firm mass in 3 o'clock location
of the LEFT breast 3 centimeters from the nipple. I palpate subtle
thickening in the LOWER LEFT axilla.

Ultrasound: Targeted ultrasound is performed, showing an irregular
mass with irregular margins in the 3 o'clock location of the LEFT
breast 3 centimeters from the nipple measuring 1.8 x 1.5 x
centimeters.

In the 3 o'clock location 5 centimeters from the nipple, there is a
circumscribed oval mass with posterior acoustic enhancement which
measures 0.5 x 0.5 x 1.0 centimeters. There is no internal blood
flow by Doppler evaluation.

Evaluation of the LEFT axilla shows 2 enlarged lymph nodes with
thickened cortex. Cortex measures up to 7 millimeters. Other lymph
nodes have normal morphology.
IMPRESSION: 1. Stable appearance of LEFT calcifications.
2. Mass in the 3 o'clock location of the LEFT breast 3 centimeters
from the nipple suspicious for malignancy.
3. Indeterminate mass in the 3 o'clock location 5 centimeters from
the nipple.
4. 2 enlarged LEFT axillary lymph nodes.

RECOMMENDATION:
1. Recommend ultrasound-guided core biopsy of mass in the 3 o'clock
location 3 centimeters from the nipple.
2. Recommend ultrasound-guided core biopsy of mass in the 3 o'clock
location 5 centimeters from the nipple.
3. Recommend ultrasound-guided core biopsy of 1 of the enlarged LEFT
axillary lymph nodes.

I have discussed the findings and recommendations with the patient.
If applicable, a reminder letter will be sent to the patient
regarding the next appointment.

BI-RADS CATEGORY  5: Highly suggestive of malignancy.

## 2020-08-14 ENCOUNTER — Other Ambulatory Visit: Payer: Self-pay | Admitting: Obstetrics and Gynecology

## 2020-08-14 ENCOUNTER — Ambulatory Visit
Admission: RE | Admit: 2020-08-14 | Discharge: 2020-08-14 | Disposition: A | Discharge status: Home / Self Care | Payer: BC Managed Care – PPO | Source: Ambulatory Visit | Attending: Obstetrics and Gynecology | Admitting: Obstetrics and Gynecology

## 2020-08-14 DIAGNOSIS — N6320 Unspecified lump in the left breast, unspecified quadrant: Secondary | ICD-10-CM

## 2020-08-14 DIAGNOSIS — N6325 Unspecified lump in the left breast, overlapping quadrants: Secondary | ICD-10-CM | POA: Diagnosis not present

## 2020-08-14 DIAGNOSIS — C50812 Malignant neoplasm of overlapping sites of left female breast: Secondary | ICD-10-CM | POA: Diagnosis not present

## 2020-08-14 DIAGNOSIS — R928 Other abnormal and inconclusive findings on diagnostic imaging of breast: Secondary | ICD-10-CM | POA: Diagnosis not present

## 2020-08-14 DIAGNOSIS — Z170 Estrogen receptor positive status [ER+]: Secondary | ICD-10-CM | POA: Diagnosis not present

## 2020-08-14 DIAGNOSIS — R590 Localized enlarged lymph nodes: Secondary | ICD-10-CM | POA: Diagnosis not present

## 2020-08-14 DIAGNOSIS — D242 Benign neoplasm of left breast: Secondary | ICD-10-CM | POA: Diagnosis not present

## 2020-08-14 IMAGING — US US BREAST BX W LOC DEV 1ST LESION IMG BX SPEC US GUIDE*L*
1 series · 15 of 25 positions shown · non-contrast
Comparison: Previous exam(s).
COMPARISON: Previous exam(s).

Addendum:
CLINICAL DATA: Patient presents ultrasound-guided biopsy of LEFT
and axilla.

EXAM:
ULTRASOUND GUIDED LEFT BREAST CORE NEEDLE BIOPSY X2
ULTRASOUND-GUIDED LEFT AXILLARY CORE NEEDLE BIOPSY

[Series 1: us breast bx w loc dev 1st lesion img bx spec us g · 0.08mm/px · 15 of 34 slices shown]
[im 1/34]
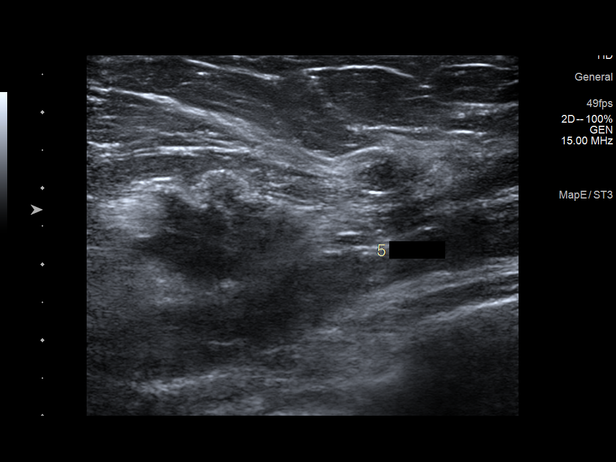
[im 3/34]
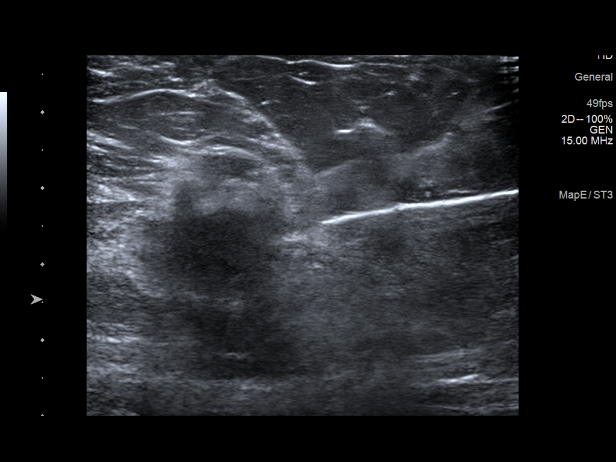
[im 6/34]
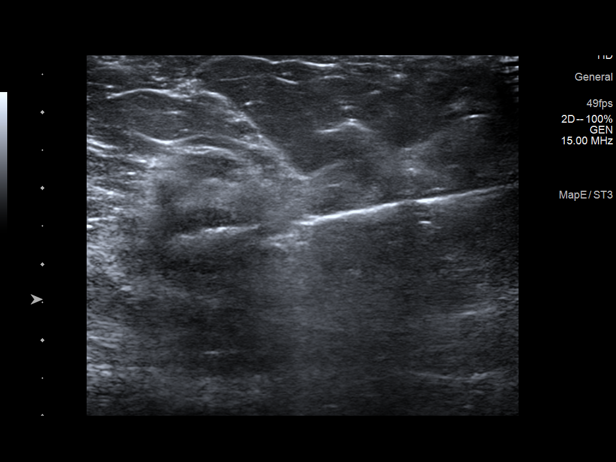
[im 7/34]
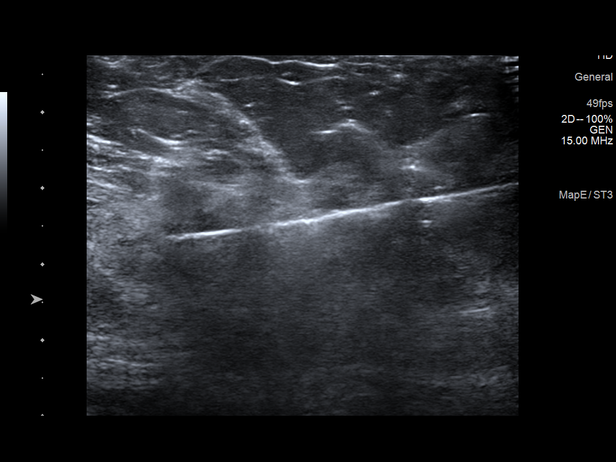
[im 10/34]
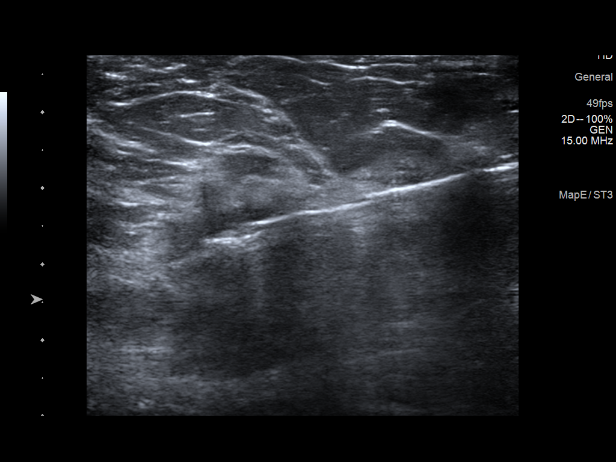
[im 13/34]
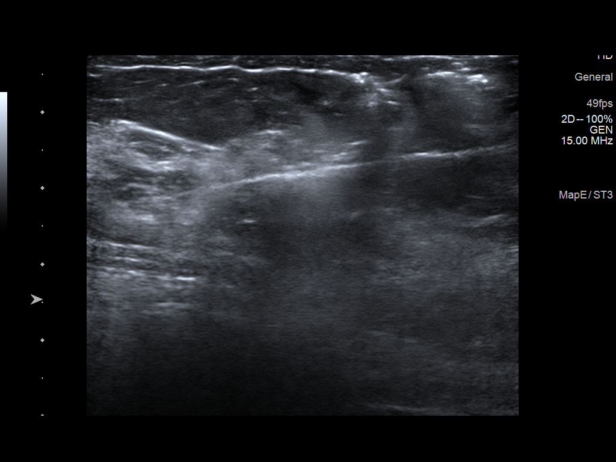
[im 14/34]
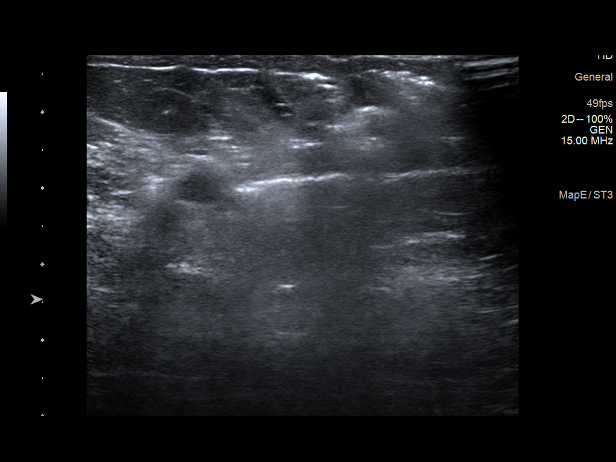
[im 17/34]
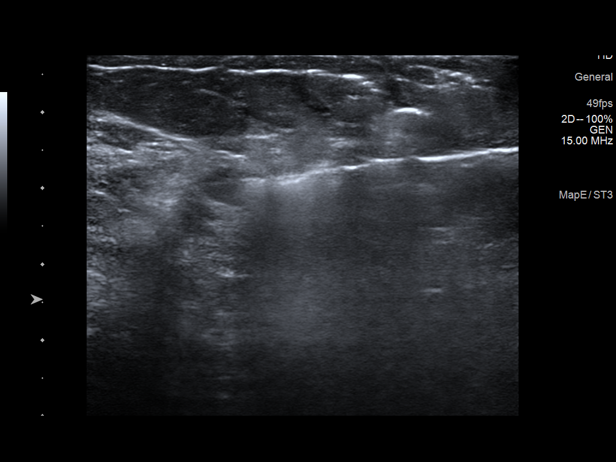
[im 20/34]
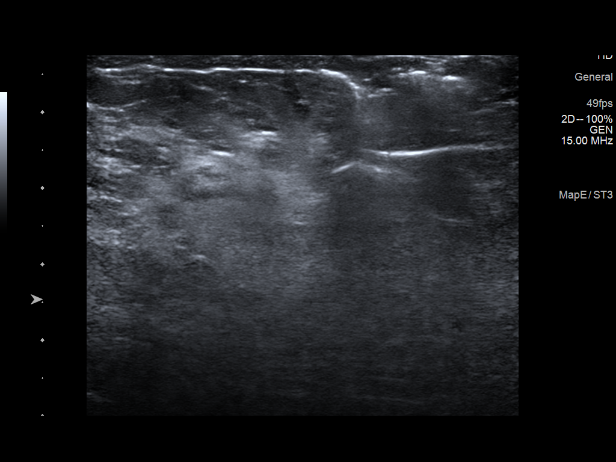
[im 21/34]
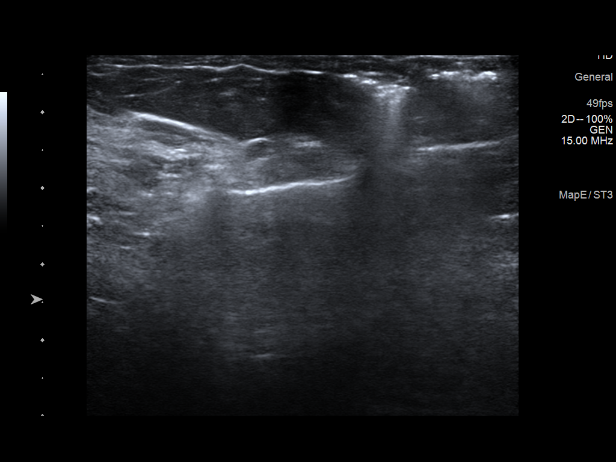
[im 24/34]
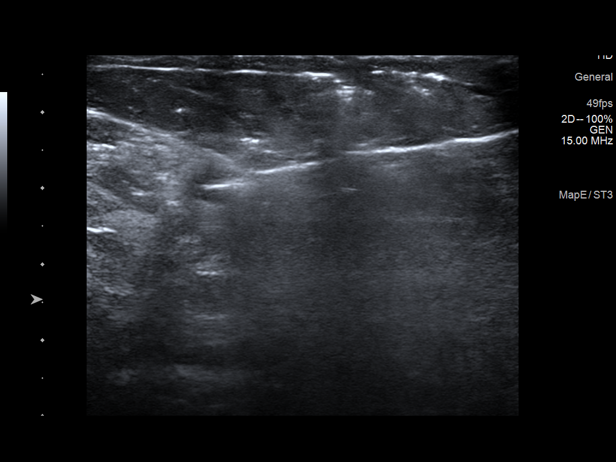
[im 27/34]
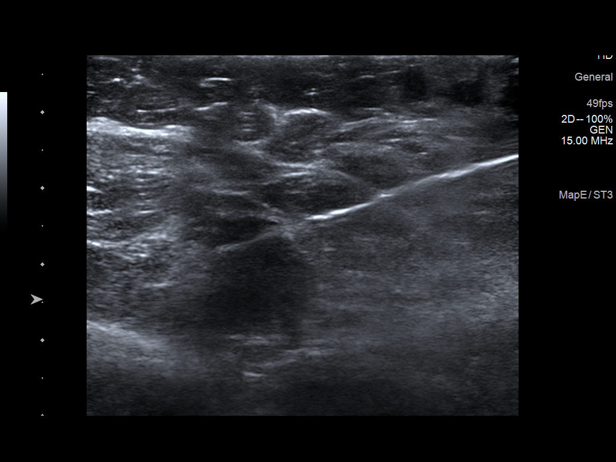
[im 28/34]
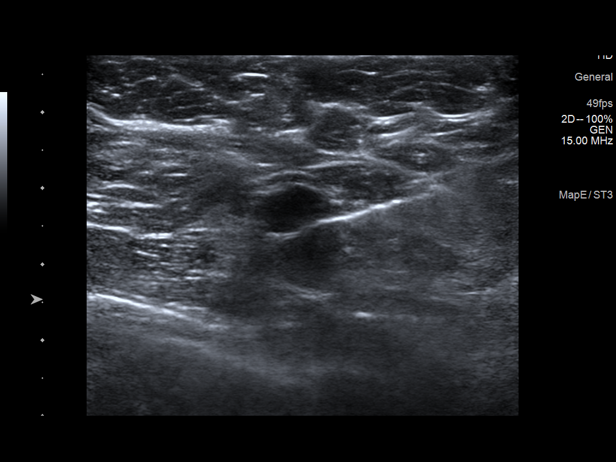
[im 31/34]
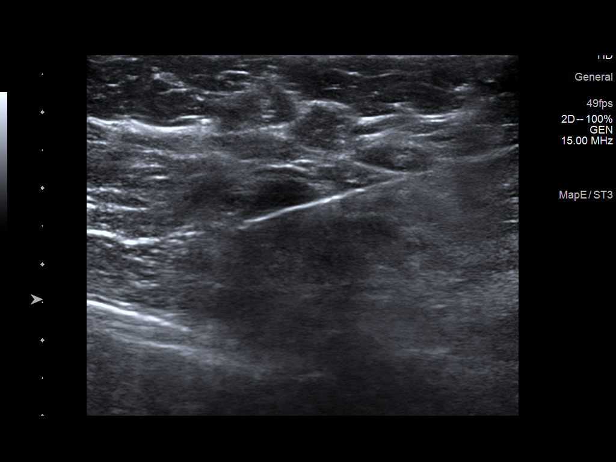
[im 34/34]
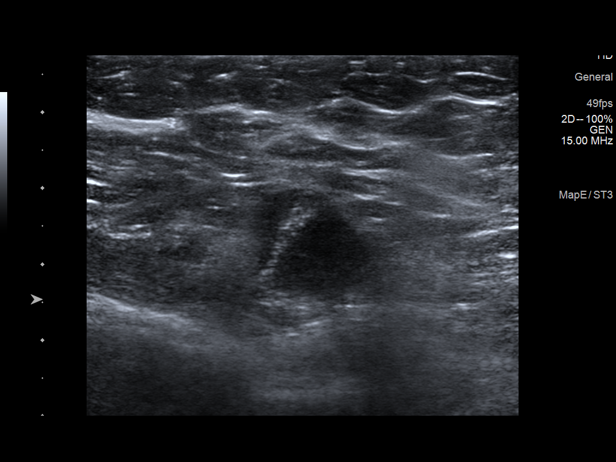

[15 of 25 positions shown; findings below may reference images not displayed]



Site 1: LEFT breast 3 o'clock, 3 centimeters from the nipple. Lesion
quadrant: LATERAL LEFT breast, coil clip

Using sterile technique and 1% lidocaine as local anesthetic, under
direct ultrasound visualization, a 12 gauge SIMAH ARBI device was
used to perform biopsy of mass in the 3 o'clock location of the LEFT
breast using a superolateral approach. At the conclusion of the
procedure coil tissue marker clip was deployed into the biopsy
cavity.

Site 2: LEFT breast 3 o'clock, 5 centimeters from the nipple. Lesion
quadrant: LATERAL LEFT breast, heart clip

Using sterile technique and 1% lidocaine as local anesthetic, under
direct ultrasound visualization, a 12 gauge SIMAH ARBI device was
used to perform biopsy of mass in the 3 o'clock location of the LEFT
5 centimeters from the nipple using a superior to inferior approach.
At the conclusion of the procedure heart tissue marker clip was
deployed into the biopsy cavity.

Site 3: LEFT axillary lymph node. Lesion quadrant: LEFT axilla,
TRIBELL clip

Using sterile technique and 1% lidocaine as local anesthetic, under
direct ultrasound visualization, a 12 gauge SIMAH ARBI device was
used to perform biopsy of mass in the 3 o'clock location of the LEFT
5 centimeters from the nipple using a superior to inferior approach.
At the conclusion of the procedure heart tissue marker clip was
deployed into the biopsy cavity.

Follow-up 2-view mammogram was performed and dictated separately.
IMPRESSION: Ultrasound guided biopsy of 2 masses in the 3 o'clock location of
the LEFT breast and enlarged LEFT axillary lymph node. No apparent
complications.

ADDENDUM:
Pathology revealed GRADE III INVASIVE DUCTAL CARCINOMA of the LEFT
breast, 3 o'clock, [5Z], coil clip. This was found to be concordant
by Dr. SIMAH ARBI.

Pathology revealed FIBROADENOMA WITH USUAL DUCTAL HYPERPLASIA,
NEGATIVE FOR CARCINOMA of the LEFT breast, 3 o'clock, [5Z], heart
clip. This was found to be concordant by Dr. SIMAH ARBI.

Pathology revealed INVASIVE DUCTAL CARCINOMA of the LEFT axillary
lymph node, tribell clip. Findings may represent an entirely
replaced lymph node. This was found to be concordant by Dr.
SIMAH ARBI.

Pathology results were discussed with the patient by telephone. The
patient reported doing well after the biopsies with tenderness at
the sites. Post biopsy instructions and care were reviewed and
questions were answered. The patient was encouraged to call The

Surgical consultation has been arranged with Dr. SIMAH ARBI
at [REDACTED] on [DATE].

Pathology results reported by SIMAH ARBI RN on [DATE].



Site 1: LEFT breast 3 o'clock, 3 centimeters from the nipple. Lesion
quadrant: LATERAL LEFT breast, coil clip

Using sterile technique and 1% lidocaine as local anesthetic, under
direct ultrasound visualization, a 12 gauge SIMAH ARBI device was
used to perform biopsy of mass in the 3 o'clock location of the LEFT
breast using a superolateral approach. At the conclusion of the
procedure coil tissue marker clip was deployed into the biopsy
cavity.

Site 2: LEFT breast 3 o'clock, 5 centimeters from the nipple. Lesion
quadrant: LATERAL LEFT breast, heart clip

Using sterile technique and 1% lidocaine as local anesthetic, under
direct ultrasound visualization, a 12 gauge SIMAH ARBI device was
used to perform biopsy of mass in the 3 o'clock location of the LEFT
5 centimeters from the nipple using a superior to inferior approach.
At the conclusion of the procedure heart tissue marker clip was
deployed into the biopsy cavity.

Site 3: LEFT axillary lymph node. Lesion quadrant: LEFT axilla,
TRIBELL clip

Using sterile technique and 1% lidocaine as local anesthetic, under
direct ultrasound visualization, a 12 gauge SIMAH ARBI device was
used to perform biopsy of mass in the 3 o'clock location of the LEFT
5 centimeters from the nipple using a superior to inferior approach.
At the conclusion of the procedure heart tissue marker clip was
deployed into the biopsy cavity.

Follow-up 2-view mammogram was performed and dictated separately.
IMPRESSION: Ultrasound guided biopsy of 2 masses in the 3 o'clock location of
the LEFT breast and enlarged LEFT axillary lymph node. No apparent
complications.

## 2020-08-14 IMAGING — MG MM BREAST LOCALIZATION CLIP
4 series · 4 of 12 positions shown · non-contrast
Comparison: Previous exam(s).

CLINICAL DATA: Status post ultrasound-guided core biopsy of 2
masses in the LEFT breast and enlarged LEFT axillary lymph nodes.

EXAM:
3D DIAGNOSTIC LEFT MAMMOGRAM POST ULTRASOUND BIOPSY

[L CC synth-2D]
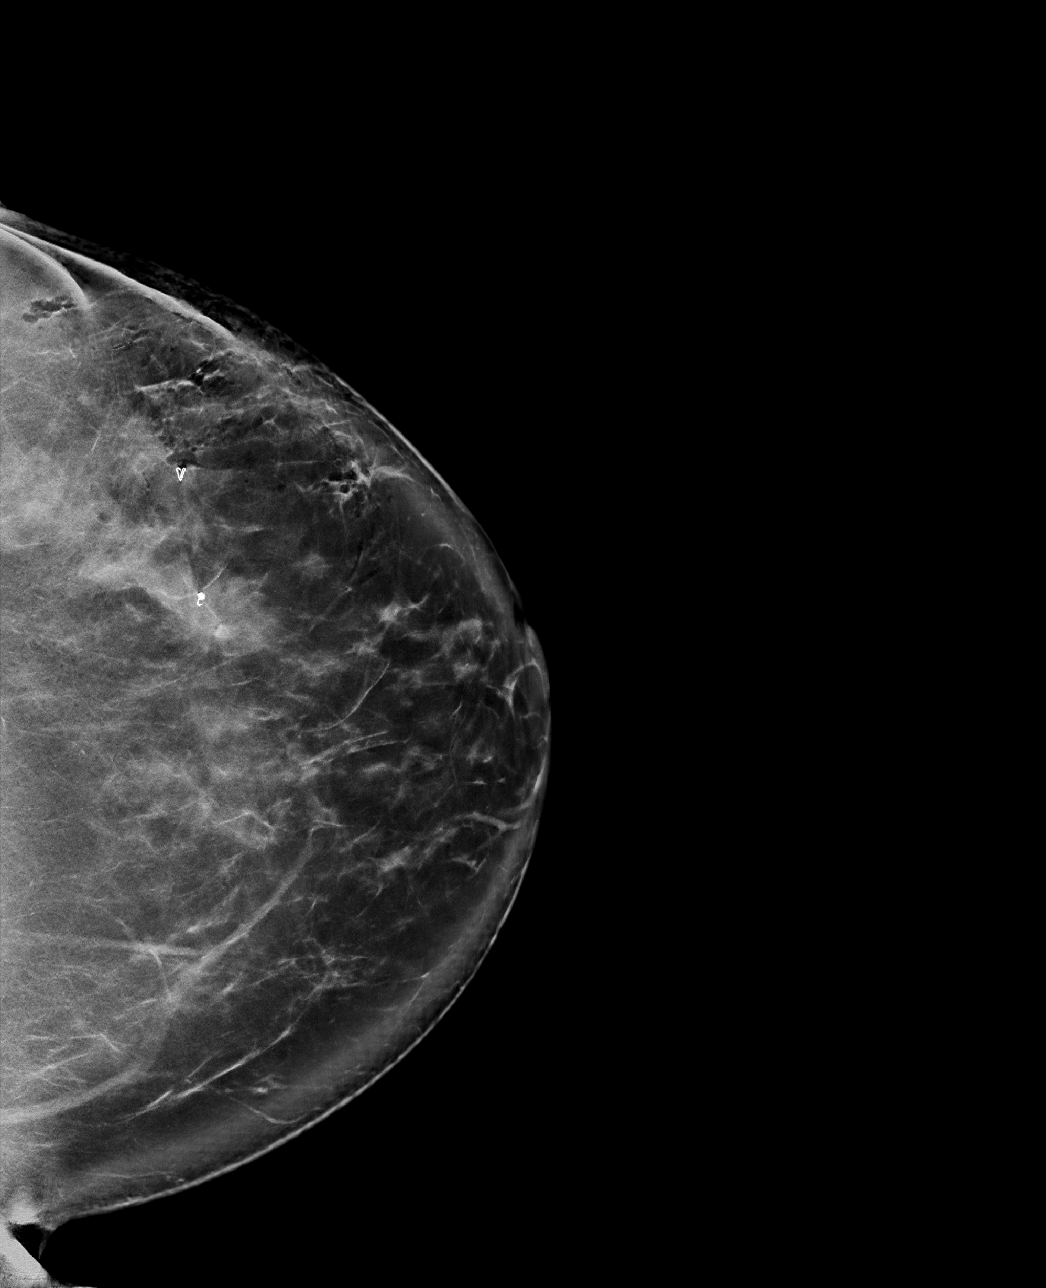

[L MLO synth-2D]
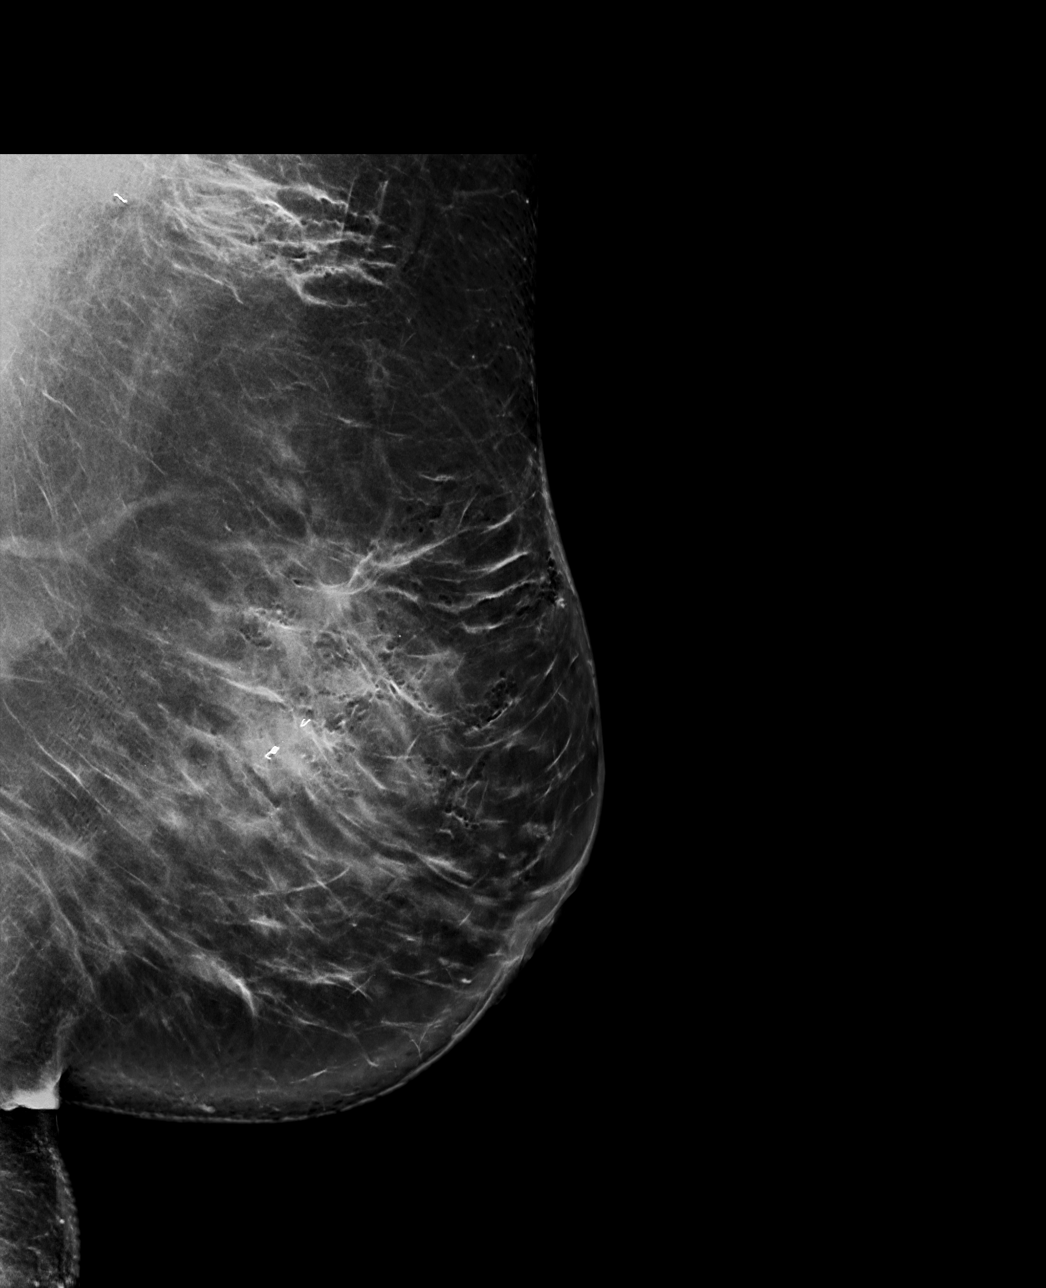

[L MLO tomo · tomo slice 53/104.0]
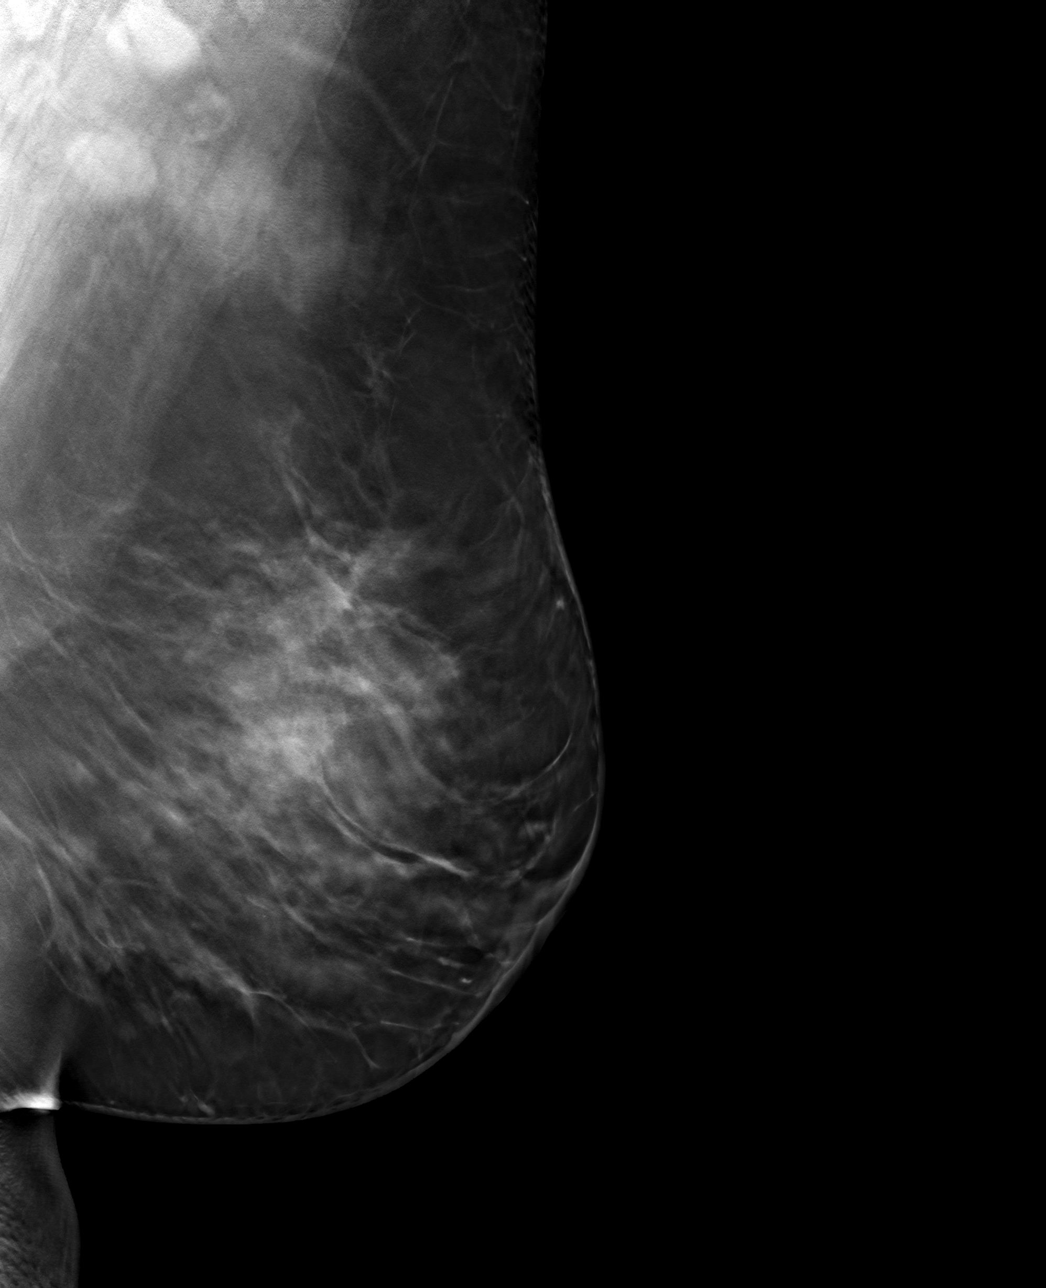

[L CC tomo · tomo slice 54/107.0]
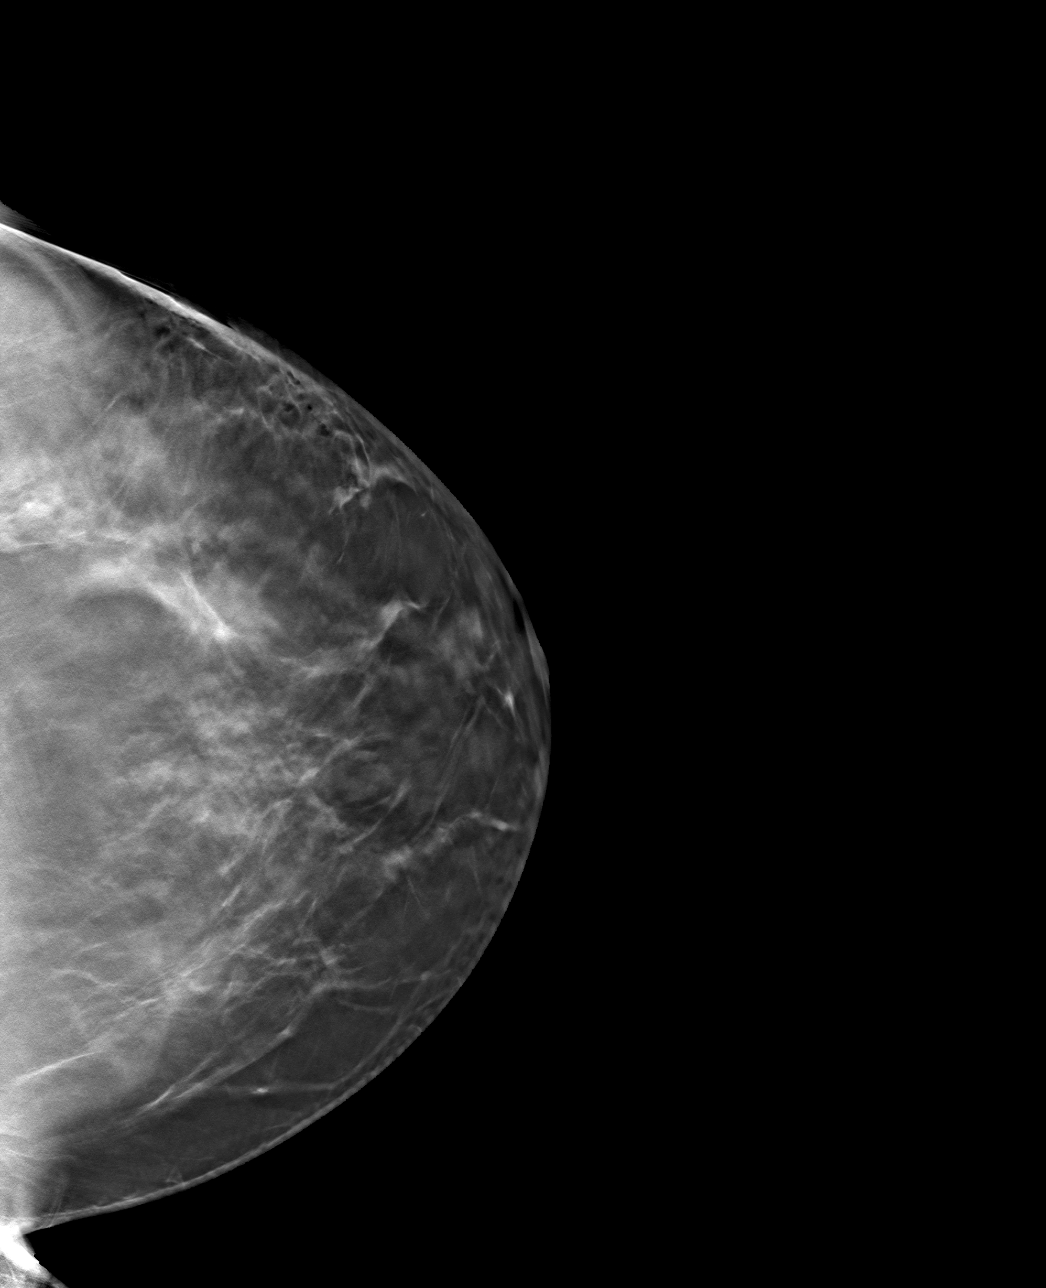

[4 of 12 positions shown; findings below may reference images not displayed]

FINDINGS: 3D Mammographic images were obtained following ultrasound guided
biopsy of mass in the 3 o'clock location of the LEFT breast 3
centimeters from the nipple and placement of a coil shaped clip. The
biopsy marking clip is in expected position at the site of biopsy.

Following biopsy of mass in the 3 o'clock location 5 centimeters
from the nipple, a heart shaped clip was placed and is identified in
expected location.

Following biopsy of enlarged LEFT axillary lymph node, and TRIBELL
clip was placed in is identified in the expected location in the
LEFT axilla.

The coil shaped and Heart shaped clips are 2.6 centimeters apart. Of
note, the ribbon shaped clip previously identified in the LATERAL
portion of the LEFT breast was removed at the time of biopsy, likely
of the lesion in the 3 o'clock location 5 centimeters.
IMPRESSION: Tissue marker clips are in the expected locations after biopsy.

Final Assessment: Post Procedure Mammograms for Marker Placement

## 2020-08-21 ENCOUNTER — Encounter: Payer: Self-pay | Admitting: *Deleted

## 2020-08-21 ENCOUNTER — Other Ambulatory Visit: Payer: Self-pay | Admitting: *Deleted

## 2020-08-21 ENCOUNTER — Telehealth: Payer: Self-pay | Admitting: *Deleted

## 2020-08-21 ENCOUNTER — Inpatient Hospital Stay: Payer: BC Managed Care – PPO | Attending: Hematology and Oncology | Admitting: Hematology and Oncology

## 2020-08-21 ENCOUNTER — Other Ambulatory Visit: Payer: Self-pay

## 2020-08-21 DIAGNOSIS — C50412 Malignant neoplasm of upper-outer quadrant of left female breast: Secondary | ICD-10-CM

## 2020-08-21 DIAGNOSIS — F419 Anxiety disorder, unspecified: Secondary | ICD-10-CM | POA: Insufficient documentation

## 2020-08-21 DIAGNOSIS — C773 Secondary and unspecified malignant neoplasm of axilla and upper limb lymph nodes: Secondary | ICD-10-CM | POA: Diagnosis not present

## 2020-08-21 DIAGNOSIS — Z79899 Other long term (current) drug therapy: Secondary | ICD-10-CM | POA: Insufficient documentation

## 2020-08-21 DIAGNOSIS — C50919 Malignant neoplasm of unspecified site of unspecified female breast: Secondary | ICD-10-CM | POA: Diagnosis not present

## 2020-08-21 DIAGNOSIS — Z170 Estrogen receptor positive status [ER+]: Secondary | ICD-10-CM

## 2020-08-21 NOTE — Telephone Encounter (Signed)
Scheduled and confirmed new pt appt for 08/22/19 at 4pm. Gave instructions and directions.  Received order for mammaprint testing on Core bx. Requisition sent to [facility] and [place]

## 2020-08-21 NOTE — Assessment & Plan Note (Signed)
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%, PR 95%, HER2 equivocal, FISH pending, Ki-67 20% T1CN1 stage IIa  Pathology and radiology counseling:Discussed with the patient, the details of pathology including the type of breast cancer,the clinical staging, the significance of ER, PR and HER-2/neu receptors and the implications for treatment. After reviewing the pathology in detail, we proceeded to discuss the different treatment options between surgery, radiation, chemotherapy, antiestrogen therapies.  Recommendations: 1. Breast conserving surgery followed by 2. Mammaprint testing to determine if chemotherapy would be of any benefit followed by 3. Adjuvant radiation therapy followed by 4. Adjuvant antiestrogen therapy  Mammaprint counseling: MINDACT is a prospective, randomized phase III controlled trial that investigates the clinical utility of MammaPrint, when compared to standard clinical pathological criteria, with 6,693 patients enrolled from over 111 institutions. Clinical high-risk patients with a Low Risk MammaPrint result, including 48% node-positive, had 5-year distant metastasis-free survival rate in excess of 94 percent, whether randomized to receive adjuvant chemotherapy or not proving MammaPrint's ability to safely identify Low Risk patients.   Return to clinic after surgery to discuss final pathology report and then determine if MammaPrint testing will need to be sent.

## 2020-08-21 NOTE — Progress Notes (Signed)
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  Patient had a routine screening mammogram that detected punctate calcifications and by ultrasound there was a 1.8 cm mass in the upper outer quadrant of the left breast.  There was an additional area of 1 cm size which was benign.  There were 2 enlarged lymph nodes.  Biopsy of the mass and the lymph nodes revealed grade 3 IDC that was ER/PR positive and HER2 negative with a Ki-67 of 20%.  She was seen by Dr. Donne Hazel and was referred to [place] for discussion for treatment options.  She is accompanied today by her husband.  I reviewed her records extensively and collaborated the history with the patient.  SUMMARY OF ONCOLOGIC HISTORY: Oncology History  Malignant neoplasm of upper-outer quadrant of left breast in female, estrogen receptor positive ([place])  08/21/2020 Initial Diagnosis   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%, PR 95%, HER2 equivocal, FISH pending, Ki-67 20%   08/21/2020 Cancer Staging   Staging form: Breast, AJCC 8th Edition - Clinical stage from 08/21/2020: Stage IIA (cT1c, cN1, cM0, G3, ER+, PR+, HER2-) - Signed by [name], MD on 08/21/2020      MEDICAL HISTORY:  Anxiety and panic attacks, MTHFR gene mutation History of stroke when she was age 63 related to birth control pills History of pregnancy loss  SURGICAL HISTORY: Past Surgical History:  Procedure Laterality Date  .  BREAST BIOPSY Left 2019  . BREAST EXCISIONAL BIOPSY Left   . CHOLECYSTECTOMY  2008  . WISDOM TOOTH EXTRACTION      SOCIAL HISTORY: Social History   Socioeconomic History  . Marital status: Married    Spouse name: Not on file  . Number of children: Not on file  . Years of education: Not on file  . Highest education level: Not on file  Occupational History  . Not on file  Tobacco Use  . Smoking status: Never Smoker  . Smokeless tobacco: Never Used  Vaping Use  . Vaping Use: Never used  Substance and Sexual Activity  . Alcohol use: Yes  . Drug use: Never  . Sexual activity: Not on file  Other Topics Concern  . Not on file  Social History Narrative  . Not on file   Social Determinants of Health   Financial Resource Strain: Not on file  Food Insecurity: Not on file  Transportation Needs: Not on file  Physical Activity: Not on file  Stress: Not on file  Social Connections: Not on file  Intimate Partner Violence: Not on file    FAMILY HISTORY: Family History  Problem Relation Age of Onset  . Eczema Father   . Allergic rhinitis Brother   . Breast cancer Mother        in 32's    ALLERGIES:  is allergic to sulfa antibiotics, oxycodone, and propoxyphene.  MEDICATIONS:  Current Outpatient Medications  Medication Sig Dispense Refill  . clonazePAM (KLONOPIN) 1 MG tablet clonazepam 1 mg tablet    .  sertraline (ZOLOFT) 100 MG tablet      Current Facility-Administered Medications  Medication Dose Route Frequency Provider Last Rate Last Admin  . methylPREDNISolone acetate (DEPO-MEDROL) injection 40 mg  40 mg Intra-articular Once Hilts, Michael, MD        REVIEW OF SYSTEMS:   Anxiety and panic attacks All other systems were reviewed with the patient and are negative.  PHYSICAL EXAMINATION: ECOG PERFORMANCE STATUS: 1 - Symptomatic but completely ambulatory  Vitals:   08/21/20 1614  BP: (!) 111/58  Pulse: 74  Resp: 17  Temp: 97.7 F (36.5 C)  SpO2: 98%    Filed Weights   08/21/20 1614  Weight: 214 lb (97.1 kg)      RADIOGRAPHIC STUDIES: I have personally reviewed the radiological reports and agreed with the findings in the report.  ASSESSMENT AND PLAN:  Malignant neoplasm of upper-outer quadrant of left breast in female, estrogen receptor positive ([place]) 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%, PR 95%, HER2 equivocal, FISH pending, Ki-67 20% T1CN1 stage IIa  Pathology and radiology counseling:Discussed with the patient, the details of pathology including the type of breast cancer,the clinical staging, the significance of ER, PR and HER-2/neu receptors and the implications for treatment. After reviewing the pathology in detail, we proceeded to discuss the different treatment options between surgery, radiation, chemotherapy, antiestrogen therapies.  Recommendations: 1. Breast conserving surgery followed by 2. Mammaprint testing to determine if chemotherapy would be of any benefit followed by 3. Adjuvant radiation therapy followed by 4. Adjuvant antiestrogen therapy  Genetic counseling: Her mother had breast cancer and an aunt had ovarian cancer  Mammaprint counseling: MINDACT is a prospective, randomized phase III controlled trial that investigates the clinical utility of MammaPrint, when compared to standard clinical pathological criteria, with 6,693 patients enrolled from over 111 institutions. Clinical high-risk patients with a Low Risk MammaPrint result, including 48% node-positive, had 5-year distant metastasis-free survival rate in excess of 94 percent, whether randomized to receive adjuvant chemotherapy or not proving MammaPrint's ability to safely  identify Low Risk patients.   Return to clinic after surgery to discuss final pathology report and then determine if MammaPrint testing will need to be sent.     All questions were answered. The patient knows to call the clinic with any problems, questions or concerns.    Harriette Ohara, MD 08/21/20

## 2020-08-22 ENCOUNTER — Encounter: Payer: Self-pay | Admitting: *Deleted

## 2020-08-22 ENCOUNTER — Telehealth: Payer: Self-pay | Admitting: Hematology and Oncology

## 2020-08-22 DIAGNOSIS — F411 Generalized anxiety disorder: Secondary | ICD-10-CM | POA: Diagnosis not present

## 2020-08-22 DIAGNOSIS — C50919 Malignant neoplasm of unspecified site of unspecified female breast: Secondary | ICD-10-CM | POA: Diagnosis not present

## 2020-08-22 DIAGNOSIS — C50412 Malignant neoplasm of upper-outer quadrant of left female breast: Secondary | ICD-10-CM | POA: Diagnosis not present

## 2020-08-22 DIAGNOSIS — Z1211 Encounter for screening for malignant neoplasm of colon: Secondary | ICD-10-CM | POA: Diagnosis not present

## 2020-08-22 NOTE — Telephone Encounter (Signed)
No 11/9 los, no changes made to pt schedule  

## 2020-08-22 NOTE — Progress Notes (Incomplete)
Location of Breast Cancer: Malignant neoplasm of upper-outer quadrant of left breast in female, estrogen receptor positive    Histology per Pathology Report:08/14/2020 1. FLUORESCENCE IN-SITU HYBRIDIZATION Results: GROUP 5: HER2 **NEGATIVE** Equivocal form of amplification of the HER2 gene was detected in the IHC 2+ tissue sample received from this individual. HER2 FISH was performed by a technologist and cell imaging and analysis on the [employer]. RATIO OF HER2/CEN17 SIGNALS 1.47 AVERAGE HER2 COPY NUMBER PER CELL 3.30 The ratio of HER2/CEN 17 is within the range < 2.0 of HER2/CEN 17 and a copy number of HER2 signals per cell is <4.0. Arch Pathol Lab Med 1:1,2018 Gillie Manners MD Pathologist, Electronic Signature ( Signed 08/21/2020) 1. PROGNOSTIC INDICATORS Results: IMMUNOHISTOCHEMICAL AND MORPHOMETRIC ANALYSIS PERFORMED MANUALLY The tumor cells are EQUIVOCAL for Her2 (2+). HER2 by FISH will be performed and the results reported separately Estrogen Receptor: 95%, POSITIVE, STRONG STAINING INTENSITY Progesterone Receptor: 95%, POSITIVE, STRONG STAINING INTENSITY Proliferation Marker Ki67: 20% REFERENCE RANGE ESTROGEN RECEPTOR NEGATIVE 0% POSITIVE =>1% REFERENCE RANGE PROGESTERONE RECEPTOR 1 of 3 FINAL for Brines, Ahlaya GAILEY ([id number]) ADDITIONAL INFORMATION:(continued) NEGATIVE 0% POSITIVE =>1% All controls stained appropriately Vicente Males MD Pathologist, Electronic Signature ( Signed 08/16/2020) FINAL DIAGNOSIS Diagnosis 1. Breast, left, needle core biopsy, 3 o'clock, 3cmfn, coil clip - INVASIVE DUCTAL CARCINOMA, SEE NOTE 2. Breast, left, needle core biopsy, 3 o'clock, 5cmfn, heart clip - FIBROADENOMA WITH USUAL DUCTAL HYPERPLASIA. SEE NOTE - NEGATIVE FOR CARCINOMA 3. Lymph node, needle/core biopsy, left axilla, tribell clip - INVASIVE DUCTAL CARCINOMA, SEE NOTE Diagnosis Note 1. Carcinoma measures 1.0 cm in greatest linear dimension and appears grade 3. Dr.  Tresa Moore reviewed the case and concurs with the diagnosis. A breast prognostic profile (ER, PR, Ki-67 and HER2) is pending and will be reported in an addendum. The [place] was notified on 08/15/2020. 3. Lymph node tissue is identified. Findings may represent an entirely replaced lymph node. Jaquita Folds MD Pathologist, Electronic 08/14/2020 Diagnosis 1. Breast, left, needle core biopsy, 3 o'clock, 3cmfn, coil clip - INVASIVE DUCTAL CARCINOMA, SEE NOTE 2. Breast, left, needle core biopsy, 3 o'clock, 5cmfn, heart clip - FIBROADENOMA WITH USUAL DUCTAL HYPERPLASIA. SEE NOTE - NEGATIVE FOR CARCINOMA 3. Lymph node, needle/core biopsy, left axilla, tribell clip - INVASIVE DUCTAL CARCINOMA, SEE NOTE   Receptor Status: ER(95t), PR (95t), Her2-neu (2t), Ki-(20%)  Did patient present with symptoms (if so, please note symptoms) or was this found on screening mammography?:   Past/Anticipated interventions by surgeon, if any  Past/Anticipated interventions by medical oncology, if any: Chemotherapy  Recommendations: 1. Breast conserving surgery followed by 2. Mammaprint testing to determine if chemotherapy would be of any benefit followed by 3. Adjuvant radiation therapy followed by 4. Adjuvant antiestrogen therapy Lymphedema issues, if any:    Pain issues, if any:    SAFETY ISSUES:  Prior radiation?   Pacemaker/ICD?   Possible current pregnancy?  Is the patient on methotrexate?   Current Complaints / other details:  ***    Sherrlyn Hock, RN 08/22/2020,1:01 PM

## 2020-08-23 ENCOUNTER — Other Ambulatory Visit: Payer: Self-pay | Admitting: General Surgery

## 2020-08-23 DIAGNOSIS — Z170 Estrogen receptor positive status [ER+]: Secondary | ICD-10-CM

## 2020-08-23 DIAGNOSIS — C50412 Malignant neoplasm of upper-outer quadrant of left female breast: Secondary | ICD-10-CM

## 2020-08-26 DIAGNOSIS — C50412 Malignant neoplasm of upper-outer quadrant of left female breast: Secondary | ICD-10-CM | POA: Diagnosis not present

## 2020-08-26 DIAGNOSIS — Z170 Estrogen receptor positive status [ER+]: Secondary | ICD-10-CM | POA: Diagnosis not present

## 2020-08-27 ENCOUNTER — Encounter: Payer: Self-pay | Admitting: Rehabilitation

## 2020-08-27 ENCOUNTER — Other Ambulatory Visit: Payer: Self-pay | Admitting: General Surgery

## 2020-08-27 ENCOUNTER — Other Ambulatory Visit: Payer: Self-pay | Admitting: Licensed Clinical Social Worker

## 2020-08-27 ENCOUNTER — Ambulatory Visit: Payer: BC Managed Care – PPO | Attending: General Surgery | Admitting: Rehabilitation

## 2020-08-27 ENCOUNTER — Other Ambulatory Visit: Payer: Self-pay

## 2020-08-27 DIAGNOSIS — R293 Abnormal posture: Secondary | ICD-10-CM | POA: Insufficient documentation

## 2020-08-27 DIAGNOSIS — C50412 Malignant neoplasm of upper-outer quadrant of left female breast: Secondary | ICD-10-CM

## 2020-08-27 DIAGNOSIS — Z170 Estrogen receptor positive status [ER+]: Secondary | ICD-10-CM

## 2020-08-27 NOTE — Patient Instructions (Signed)

## 2020-08-27 NOTE — Therapy (Signed)
[place], [place], [address] Phone: [phone]   Fax:  [phone]  Physical Therapy Evaluation  Patient Details  Name: Tracy Dyer MRN: [id number] Date of Birth: [birth year] Referring Provider (PT): Dr Donne Hazel   Encounter Date: 08/27/2020   PT End of Session - 08/27/20 1359    Visit Number 1    Number of Visits 2    Date for PT Re-Evaluation 09/24/20    PT Start Time 5852    PT Stop Time 1355    PT Time Calculation (min) 38 min    Activity Tolerance Patient tolerated treatment well    Behavior During Therapy [facility] for tasks assessed/performed           History reviewed. No pertinent past medical history.  Past Surgical History:  Procedure Laterality Date  . BREAST BIOPSY Left 2019  . BREAST EXCISIONAL BIOPSY Left   . CHOLECYSTECTOMY  2008  . WISDOM TOOTH EXTRACTION      There were no vitals filed for this visit.    Subjective Assessment - 08/27/20 1317    Subjective I have Rt arm problems with falls down stairs and one off a ladder but the left shoulder is okay.    Pertinent History Pt with recent diagnosis of stage 2A ER/PR positiveHER2- left breast cancer with pending lumpectomy and SLNB with chemo and radiation status unknown. Rt rotator curff repair possibly needed    Limitations --   none   Patient Stated Goals get information from providers    Currently in Pain? No/denies              [facility] PT Assessment - 08/27/20 0001      Assessment   Medical Diagnosis left breast cancer    Referring Provider (PT) Dr Donne Hazel    Onset Date/Surgical Date 08/22/20    Hand Dominance Right    Next MD Visit tomorrow    Prior Therapy for Rt shoulder      Precautions   Precaution Comments active cancer      Restrictions   Weight Bearing Restrictions No      Balance Screen   Has the patient fallen in the past 6 months No    Has the patient had a decrease in activity  level because of a fear of falling?  No    Is the patient reluctant to leave their home because of a fear of falling?  No      Home Environment   Living Environment Private residence    Living Arrangements Spouse/significant other    Available Help at Discharge Family      Prior Function   Level of Independence Independent    Vocation Full time employment    Vocation Requirements desk work    Leisure not currently      Cognition   Overall Cognitive Status Within Functional Limits for tasks assessed      Posture/Postural Control   Posture/Postural Control Postural limitations    Postural Limitations Rounded Shoulders;Forward head      ROM / Strength   AROM / PROM / Strength AROM      AROM   Overall AROM Comments Rt arm not tested due to [facility] status    AROM Assessment Site Shoulder    Right/Left Shoulder Right;Left    Left Shoulder Extension 56 Degrees    Left Shoulder Flexion 155 Degrees    Left Shoulder ABduction 165 Degrees    Left Shoulder Internal  Rotation 90 Degrees    Left Shoulder External Rotation 100 Degrees             LYMPHEDEMA/ONCOLOGY QUESTIONNAIRE - 08/27/20 0001      Lymphedema Assessments   Lymphedema Assessments Upper extremities      Right Upper Extremity Lymphedema   10 cm Proximal to Olecranon Process 34.9 cm    Olecranon Process 27.5 cm    10 cm Proximal to Ulnar Styloid Process 24.7 cm    Just Proximal to Ulnar Styloid Process 16.2 cm    Across Hand at [employer] 20.4 cm    At [place] of 2nd Digit 6.3 cm      Left Upper Extremity Lymphedema   10 cm Proximal to Olecranon Process 35.4 cm    Olecranon Process 27.6 cm    10 cm Proximal to Ulnar Styloid Process 24.3 cm    Just Proximal to Ulnar Styloid Process 6.4 cm    Across Hand at [employer] 20.3 cm    At [place] of 2nd Digit 6.4 cm           L-DEX FLOWSHEETS - 08/27/20 1300      L-DEX LYMPHEDEMA SCREENING   Measurement Type Unilateral    L-DEX MEASUREMENT EXTREMITY Upper  Extremity    POSITION  Standing    DOMINANT SIDE Right    At Risk Side Left    BASELINE SCORE (UNILATERAL) -0.3                Quick Dash - 08/27/20 0001    Open a tight or new jar Mild difficulty    Do heavy household chores (wash walls, wash floors) No difficulty    Carry a shopping bag or briefcase Mild difficulty    Wash your back No difficulty    Use a knife to cut food No difficulty    Recreational activities in which you take some force or impact through your arm, shoulder, or hand (golf, hammering, tennis) Moderate difficulty    During the past week, to what extent has your arm, shoulder or hand problem interfered with your normal social activities with family, friends, neighbors, or groups? Not at all    During the past week, to what extent has your arm, shoulder or hand problem limited your work or other regular daily activities Not at all    Arm, shoulder, or hand pain. Severe    Tingling (pins and needles) in your arm, shoulder, or hand Moderate    Difficulty Sleeping No difficulty    DASH Score 20.45 %            Objective measurements completed on examination: See above findings.               PT Education - 08/27/20 1358    Education Details lymphedema briefly, post op instructions, LDex    Person(s) Educated Patient    Methods Explanation;Demonstration;Tactile cues;Verbal cues;Handout    Comprehension Verbalized understanding;Returned demonstration                Breast Clinic Goals - 08/27/20 1402      Patient will be able to verbalize understanding of pertinent lymphedema risk reduction practices relevant to her diagnosis specifically related to skin care.   Status New      Patient will be able to return demonstrate and/or verbalize understanding of the post-op home exercise program related to regaining shoulder range of motion.   Status New      Patient will  be able to verbalize understanding of the importance of attending the  postoperative After Breast Cancer Class for further lymphedema risk reduction education and therapeutic exercise.   Status New                 Plan - 08/27/20 1359    Clinical Impression Statement Pre-op baselines obtained today prior to left lumpectomy with unknown radiation and chemotherapy status.  Follow up appt scheduled today as surgery is scheduled already.  Discussion today about importance of walking, post op care, lymphedema surveillance, and answering all questions regarding lymphedema by patient.    Personal Factors and Comorbidities Fitness    Stability/Clinical Decision Making Stable/Uncomplicated    Clinical Decision Making Low    Rehab Potential Excellent    PT Frequency --   post op with more as needed   PT Duration 4 weeks    PT Treatment/Interventions Patient/family education;ADLs/Self Care Home Management;Therapeutic exercise    PT Next Visit Plan post op asessment and more visits PRN, schedule SOZO    PT Home Exercise Plan post op    Consulted and Agree with Plan of Care Patient           Patient will benefit from skilled therapeutic intervention in order to improve the following deficits and impairments:  Postural dysfunction  Visit Diagnosis: Abnormal posture - Plan: PT plan of care cert/re-cert     Problem List Patient Active Problem List   Diagnosis Date Noted  . Malignant neoplasm of upper-outer quadrant of left breast in female, estrogen receptor positive ([place]) 08/21/2020  . Anxiety 11/16/2013  . Hypercoagulable state ([place]) 11/16/2013  . TIA (transient ischemic attack) 11/15/2013  . Chest discomfort 01/20/2012  . Factor V Leiden ([place]) 01/20/2012  . MVP (mitral valve prolapse) 01/20/2012  . Acute bronchitis 12/23/2011    Stark Bray 08/27/2020, 2:05 PM  [place], [place], [address] Phone: [phone]   Fax:  [phone]  Name: Tracy Dyer MRN: [id number] Date of Birth: [birth year]

## 2020-08-28 ENCOUNTER — Inpatient Hospital Stay: Payer: BC Managed Care – PPO

## 2020-08-28 ENCOUNTER — Other Ambulatory Visit: Payer: Self-pay

## 2020-08-28 ENCOUNTER — Inpatient Hospital Stay (HOSPITAL_BASED_OUTPATIENT_CLINIC_OR_DEPARTMENT_OTHER): Payer: BC Managed Care – PPO | Admitting: Licensed Clinical Social Worker

## 2020-08-28 ENCOUNTER — Encounter: Payer: Self-pay | Admitting: Licensed Clinical Social Worker

## 2020-08-28 ENCOUNTER — Ambulatory Visit
Admission: RE | Admit: 2020-08-28 | Discharge: 2020-08-28 | Disposition: A | Discharge status: Home / Self Care | Payer: BC Managed Care – PPO | Source: Ambulatory Visit | Attending: Radiation Oncology | Admitting: Radiation Oncology

## 2020-08-28 ENCOUNTER — Telehealth: Payer: Self-pay | Admitting: *Deleted

## 2020-08-28 VITALS — BP 139/71 | HR 67 | Temp 97.0°F | Resp 18 | Ht 66.0 in | Wt 211.5 lb

## 2020-08-28 VITALS — BP 139/71 | HR 67 | Temp 96.0°F | Resp 18 | Ht 66.0 in | Wt 211.5 lb

## 2020-08-28 DIAGNOSIS — C50412 Malignant neoplasm of upper-outer quadrant of left female breast: Secondary | ICD-10-CM

## 2020-08-28 DIAGNOSIS — C779 Secondary and unspecified malignant neoplasm of lymph node, unspecified: Secondary | ICD-10-CM | POA: Insufficient documentation

## 2020-08-28 DIAGNOSIS — Z803 Family history of malignant neoplasm of breast: Secondary | ICD-10-CM

## 2020-08-28 DIAGNOSIS — Z170 Estrogen receptor positive status [ER+]: Secondary | ICD-10-CM | POA: Diagnosis not present

## 2020-08-28 DIAGNOSIS — Z8041 Family history of malignant neoplasm of ovary: Secondary | ICD-10-CM | POA: Insufficient documentation

## 2020-08-28 DIAGNOSIS — Z808 Family history of malignant neoplasm of other organs or systems: Secondary | ICD-10-CM | POA: Diagnosis not present

## 2020-08-28 DIAGNOSIS — Z801 Family history of malignant neoplasm of trachea, bronchus and lung: Secondary | ICD-10-CM

## 2020-08-28 LAB — GENETIC SCREENING ORDER

## 2020-08-28 NOTE — Progress Notes (Signed)
Radiation Oncology         [phone] ________________________________  Initial Outpatient Consultation  Name: Tracy Dyer MRN: [id number]  Date: 08/28/2020  DOB: March 16, 1971  CC:[name], MD  [name], MD   REFERRING PHYSICIAN: [name], MD  DIAGNOSIS: The encounter diagnosis was Malignant neoplasm of upper-outer quadrant of left breast in female, estrogen receptor positive ([place]).  Stage IIA (cT1c, cN1, cM0) Left Breast UOQ, Invasive Ductal Carcinoma, ER+ / PR+ / Her2-, Grade 3  HISTORY OF PRESENT ILLNESS::Tracy Dyer is a 50 y.o. female who is seen as a courtesy of Dr. Donne Hazel for an opinion concerning radiation therapy as part of management for her recently diagnosed breast cancer. Today, she is accompanied by her husband. She underwent bilateral diagnostic mammography and left breast ultrasonography on 08/13/2020 for evaluation of left breast lump. Results showed a mass in the 3 o'clock location of the left breast 3 cm from the nipple that was suspicious for malignancy. There was also noted to be an indeterminate mass in the 3 o'clock location 5 cm from the nipple and two enlarged left axillary lymph nodes. Previously seen left calcifications were stable.  Biopsy on 08/14/2020 showed: grade 3 invasive ductal carcinoma of the left breast mass at the 3 o'clock position 3 cm from the nipple as well as of the left axillary lymph node. Prognostic indicators were significant for: estrogen receptor, 95% positive and progesterone receptor, 95% positive, both with strong staining intensities. Proliferation marker Ki67 at 20%. HER2 negative. The left breast mass at the 3 o'clock position 5 cm from the nipple showed fibroadenoma with usual ductal hyperplasia but not ductal carcinoma.  Subsequently, the patient was seen by Dr. Lindi Adie on 08/21/2020, who recommended proceeding with breast conserving surgery followed by MammaPrint testing to determine if  chemotherapy would be of any benefit, adjuvant radiation therapy, and adjuvant antiestrogen therapy.  PREVIOUS RADIATION THERAPY: No  PAST MEDICAL HISTORY:  Past Medical History:  Diagnosis Date  . Family history of breast cancer   . Family history of lung cancer   . Family history of ovarian cancer     PAST SURGICAL HISTORY: Past Surgical History:  Procedure Laterality Date  . BREAST BIOPSY Left 2019  . BREAST EXCISIONAL BIOPSY Left   . CHOLECYSTECTOMY  2008  . WISDOM TOOTH EXTRACTION      FAMILY HISTORY:  Family History  Problem Relation Age of Onset  . Eczema Father   . Allergic rhinitis Brother   . Breast cancer Mother        in 78's  . Cancer Maternal Uncle        unk type  . Lung cancer Maternal Grandfather 66  . Heart Problems Paternal Grandfather   . Melanoma Cousin   . Ovarian cancer Paternal Great-grandmother   . Cancer Paternal Great-grandmother        GYN cancer, possibly ovarian?  . Cancer Maternal Aunt        half aunts/uncles x5- rare cancers    SOCIAL HISTORY:  Social History   Tobacco Use  . Smoking status: Never Smoker  . Smokeless tobacco: Never Used  Vaping Use  . Vaping Use: Never used  Substance Use Topics  . Alcohol use: Yes  . Drug use: Never    ALLERGIES:  Allergies  Allergen Reactions  . Sulfa Antibiotics     Other reaction(s): Rash, urticarial  . Hydromorphone Anxiety  . Oxycodone Anxiety  . Propoxyphene Rash and Anxiety  . Sulfamethoxazole  Rash    MEDICATIONS:  Current Outpatient Medications  Medication Sig Dispense Refill  . clonazePAM (KLONOPIN) 1 MG tablet clonazepam 1 mg tablet    . fluticasone (FLONASE) 50 MCG/ACT nasal spray fluticasone propionate 50 mcg/actuation nasal spray,suspension    . sertraline (ZOLOFT) 100 MG tablet      Current Facility-Administered Medications  Medication Dose Route Frequency Provider Last Rate Last Admin  . methylPREDNISolone acetate (DEPO-MEDROL) injection 40 mg  40 mg  Intra-articular Once Hilts, Michael, MD        REVIEW OF SYSTEMS:  A 10+ POINT REVIEW OF SYSTEMS WAS OBTAINED including neurology, dermatology, psychiatry, cardiac, respiratory, lymph, extremities, GI, GU, musculoskeletal, constitutional, reproductive, HEENT.  The patient reported feeling a lump in the lateral aspect of the left breast and some pain prior to biopsy and diagnosis.  She denies any nipple discharge or bleeding   PHYSICAL EXAM:  height is 5' 6" (1.676 m) and weight is 211 lb 8 oz (95.9 kg). Her temporal temperature is 96 F (35.6 C) (abnormal). Her blood pressure is 139/71 and her pulse is 67. Her respiration is 18 and oxygen saturation is 100%.   General: Alert and oriented, in no acute distress HEENT: Head is normocephalic. Extraocular movements are intact.  Neck: Neck is supple, no palpable cervical or supraclavicular lymphadenopathy. Heart: Regular in rate and rhythm with no murmurs, rubs, or gallops. Chest: Clear to auscultation bilaterally, with no rhonchi, wheezes, or rales. Abdomen: Soft, nontender, nondistended, with no rigidity or guarding. Extremities: No cyanosis or edema. Lymphatics: see Neck Exam Skin: No concerning lesions. Musculoskeletal: symmetric strength and muscle tone throughout. Neurologic: Cranial nerves II through XII are grossly intact. No obvious focalities. Speech is fluent. Coordination is intact. Psychiatric: Judgment and insight are intact. Affect is appropriate. Right breast: No palpable mass, nipple discharge, or bleeding.  Left breast: Biopsy site noted in the upper outer aspect of the left breast.  No nipple discharge or bleeding  ECOG = 1  0 - Asymptomatic (Fully active, able to carry on all predisease activities without restriction)  1 - Symptomatic but completely ambulatory (Restricted in physically strenuous activity but ambulatory and able to carry out work of a light or sedentary nature. For example, light housework, office work)  2 -  Symptomatic, <50% in bed during the day (Ambulatory and capable of all self care but unable to carry out any work activities. Up and about more than 50% of waking hours)  3 - Symptomatic, >50% in bed, but not bedbound (Capable of only limited self-care, confined to bed or chair 50% or more of waking hours)  4 - Bedbound (Completely disabled. Cannot carry on any self-care. Totally confined to bed or chair)  5 - Death   Eustace Pen MM, Creech RH, Tormey DC, et al. [phone]). "Toxicity and response criteria of the [facility]". [place]. 5 (6): 649-55  LABORATORY DATA:  No results found for: WBC, HGB, HCT, MCV, PLT, NEUTROABS No results found for: NA, K, CL, CO2, GLUCOSE, CREATININE, CALCIUM    RADIOGRAPHY: US BREAST COMPLETE UNI LEFT INC AXILLA  Result Date: 08/13/2020 CLINICAL DATA:  Delayed followup for calcifications in the LEFT breast. Today the patient feels a lump in the LEFT breast. Patient's mother was diagnosed with breast cancer in her 48s. EXAM: DIGITAL DIAGNOSTIC BILATERAL MAMMOGRAM WITH CAD AND TOMO ULTRASOUND LEFT BREAST COMPARISON:  Previous exam(s). ACR Breast Density Category c: The breast tissue is heterogeneously dense, which may obscure small masses. FINDINGS:  RIGHT BREAST: Mammogram: No suspicious mass, distortion, or microcalcifications are identified to suggest presence of malignancy. mammographic images were processed with CAD. LEFT BREAST: Mammogram: A group of punctate calcifications within the UPPER-OUTER QUADRANT of the LEFT breast appears stable over multiple prior studies. There has been interval development of a mass in the UPPER-OUTER QUADRANT of the LEFT breast, associated with distortion. Mass is palpable and marked with a BB. Mammographic images were processed with CAD. Physical Exam: I palpate a discrete firm mass in 3 o'clock location of the LEFT breast 3 centimeters from the nipple. I palpate subtle thickening in the LOWER LEFT axilla.  Ultrasound: Targeted ultrasound is performed, showing an irregular mass with irregular margins in the 3 o'clock location of the LEFT breast 3 centimeters from the nipple measuring 1.8 x 1.5 x 1.3 centimeters. In the 3 o'clock location 5 centimeters from the nipple, there is a circumscribed oval mass with posterior acoustic enhancement which measures 0.5 x 0.5 x 1.0 centimeters. There is no internal blood flow by Doppler evaluation. Evaluation of the LEFT axilla shows 2 enlarged lymph nodes with thickened cortex. Cortex measures up to 7 millimeters. Other lymph nodes have normal morphology. IMPRESSION: 1. Stable appearance of LEFT calcifications. 2. Mass in the 3 o'clock location of the LEFT breast 3 centimeters from the nipple suspicious for malignancy. 3. Indeterminate mass in the 3 o'clock location 5 centimeters from the nipple. 4. 2 enlarged LEFT axillary lymph nodes. RECOMMENDATION: 1. Recommend ultrasound-guided core biopsy of mass in the 3 o'clock location 3 centimeters from the nipple. 2. Recommend ultrasound-guided core biopsy of mass in the 3 o'clock location 5 centimeters from the nipple. 3. Recommend ultrasound-guided core biopsy of 1 of the enlarged LEFT axillary lymph nodes. I have discussed the findings and recommendations with the patient. If applicable, a reminder letter will be sent to the patient regarding the next appointment. BI-RADS CATEGORY  5: Highly suggestive of malignancy. Electronically Signed   By: Nolon Nations M.D.   On: 08/13/2020 12:52   MM DIAG BREAST TOMO BILATERAL  Result Date: 08/13/2020 CLINICAL DATA:  Delayed followup for calcifications in the LEFT breast. Today the patient feels a lump in the LEFT breast. Patient's mother was diagnosed with breast cancer in her 24s. EXAM: DIGITAL DIAGNOSTIC BILATERAL MAMMOGRAM WITH CAD AND TOMO ULTRASOUND LEFT BREAST COMPARISON:  Previous exam(s). ACR Breast Density Category c: The breast tissue is heterogeneously dense, which may  obscure small masses. FINDINGS: RIGHT BREAST: Mammogram: No suspicious mass, distortion, or microcalcifications are identified to suggest presence of malignancy. mammographic images were processed with CAD. LEFT BREAST: Mammogram: A group of punctate calcifications within the UPPER-OUTER QUADRANT of the LEFT breast appears stable over multiple prior studies. There has been interval development of a mass in the UPPER-OUTER QUADRANT of the LEFT breast, associated with distortion. Mass is palpable and marked with a BB. Mammographic images were processed with CAD. Physical Exam: I palpate a discrete firm mass in 3 o'clock location of the LEFT breast 3 centimeters from the nipple. I palpate subtle thickening in the LOWER LEFT axilla. Ultrasound: Targeted ultrasound is performed, showing an irregular mass with irregular margins in the 3 o'clock location of the LEFT breast 3 centimeters from the nipple measuring 1.8 x 1.5 x 1.3 centimeters. In the 3 o'clock location 5 centimeters from the nipple, there is a circumscribed oval mass with posterior acoustic enhancement which measures 0.5 x 0.5 x 1.0 centimeters. There is no internal blood flow by Doppler evaluation. Evaluation of  the LEFT axilla shows 2 enlarged lymph nodes with thickened cortex. Cortex measures up to 7 millimeters. Other lymph nodes have normal morphology. IMPRESSION: 1. Stable appearance of LEFT calcifications. 2. Mass in the 3 o'clock location of the LEFT breast 3 centimeters from the nipple suspicious for malignancy. 3. Indeterminate mass in the 3 o'clock location 5 centimeters from the nipple. 4. 2 enlarged LEFT axillary lymph nodes. RECOMMENDATION: 1. Recommend ultrasound-guided core biopsy of mass in the 3 o'clock location 3 centimeters from the nipple. 2. Recommend ultrasound-guided core biopsy of mass in the 3 o'clock location 5 centimeters from the nipple. 3. Recommend ultrasound-guided core biopsy of 1 of the enlarged LEFT axillary lymph nodes. I  have discussed the findings and recommendations with the patient. If applicable, a reminder letter will be sent to the patient regarding the next appointment. BI-RADS CATEGORY  5: Highly suggestive of malignancy. Electronically Signed   By: Nolon Nations M.D.   On: 08/13/2020 12:52   [place] AXILLARY NODE CORE BIOPSY LEFT  Addendum Date: 08/15/2020   ADDENDUM REPORT: 08/15/2020 12:38 ADDENDUM: 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 This was found to be concordant by Dr. Nolon Nations. 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 This was found to be concordant by Dr. Nolon Nations. 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 Findings may represent an entirely replaced lymph node. This was found to be concordant by Dr. Nolon Nations. Pathology results were discussed with the patient by telephone. The patient reported doing well after the biopsies with tenderness at the sites. Post biopsy instructions and care were reviewed and questions were answered. The patient was encouraged to call [facility] for any additional concerns. Surgical consultation has been arranged with Dr. [name] at [facility] on August 21, 2020. Pathology results reported by Stacie Acres RN on 08/15/2020. Electronically Signed   By: Nolon Nations M.D.   On: 08/15/2020 12:38   Result Date: 08/15/2020 CLINICAL DATA:  Patient presents ultrasound-guided biopsy of LEFT and axilla. EXAM: ULTRASOUND GUIDED LEFT BREAST CORE NEEDLE BIOPSY X2 ULTRASOUND-GUIDED LEFT AXILLARY CORE NEEDLE BIOPSY COMPARISON:  Previous exam(s). PROCEDURE: I met with the patient and we discussed the procedure of ultrasound-guided biopsy, including benefits and alternatives. We discussed the high likelihood of a  successful procedure. We discussed the risks of the procedure, including infection, bleeding, tissue injury, clip migration, and inadequate sampling. Informed written consent was given. The usual time-out protocol was performed immediately prior to the procedure. Site 1: LEFT breast 3 o'clock, 3 centimeters from the nipple. Lesion quadrant: LATERAL LEFT breast, coil clip Using sterile technique and 1% lidocaine as local anesthetic, under direct ultrasound visualization, a 12 gauge spring-loaded device was used to perform biopsy of mass in the 3 o'clock location of the LEFT breast using a superolateral approach. At the conclusion of the procedure coil tissue marker clip was deployed into the biopsy cavity. Site 2: LEFT breast 3 o'clock, 5 centimeters from the nipple. Lesion quadrant: LATERAL LEFT breast, heart clip Using sterile technique and 1% lidocaine as local anesthetic, under direct ultrasound visualization, a 12 gauge spring-loaded device was used to perform biopsy of mass in the 3 o'clock location of the LEFT 5 centimeters from the nipple using a superior to inferior approach. At the conclusion of the procedure heart tissue marker clip  was deployed into the biopsy cavity. Site 3: LEFT axillary lymph node. Lesion quadrant: LEFT axilla, TRIBELL clip Using sterile technique and 1% lidocaine as local anesthetic, under direct ultrasound visualization, a 12 gauge spring-loaded device was used to perform biopsy of mass in the 3 o'clock location of the LEFT 5 centimeters from the nipple using a superior to inferior approach. At the conclusion of the procedure heart tissue marker clip was deployed into the biopsy cavity. Follow-up 2-view mammogram was performed and dictated separately. IMPRESSION: Ultrasound guided biopsy of 2 masses in the 3 o'clock location of the LEFT breast and enlarged LEFT axillary lymph node. No apparent complications. Electronically Signed: By: Nolon Nations M.D. On: 08/14/2020 09:39   MM  CLIP PLACEMENT LEFT  Result Date: 08/14/2020 CLINICAL DATA:  Status post ultrasound-guided core biopsy of 2 masses in the LEFT breast and enlarged LEFT axillary lymph nodes. EXAM: 3D DIAGNOSTIC LEFT MAMMOGRAM POST ULTRASOUND BIOPSY COMPARISON:  Previous exam(s). FINDINGS: 3D Mammographic images were obtained following ultrasound guided biopsy of mass in the 3 o'clock location of the LEFT breast 3 centimeters from the nipple and placement of a coil shaped clip. The biopsy marking clip is in expected position at the site of biopsy. Following biopsy of mass in the 3 o'clock location 5 centimeters from the nipple, a heart shaped clip was placed and is identified in expected location. Following biopsy of enlarged LEFT axillary lymph node, and TRIBELL clip was placed in is identified in the expected location in the LEFT axilla. The coil shaped and Heart shaped clips are 2.6 centimeters apart. Of note, the ribbon shaped clip previously identified in the LATERAL portion of the LEFT breast was removed at the time of biopsy, likely of the lesion in the 3 o'clock location 5 centimeters. IMPRESSION: Tissue marker clips are in the expected locations after biopsy. Final Assessment: Post Procedure Mammograms for Marker Placement Electronically Signed   By: Nolon Nations M.D.   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 This was found to be concordant by Dr. Nolon Nations. 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 This was found to be concordant by Dr. Nolon Nations. 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 Findings may represent an entirely replaced lymph node.  This was found to be concordant by Dr. Nolon Nations. Pathology results were discussed with the patient by telephone. The patient reported doing well after the biopsies with tenderness at the sites. Post biopsy instructions and care were reviewed and questions were answered. The patient was encouraged to call [facility] for any additional concerns. Surgical consultation has been arranged with Dr. [name] at [facility] on August 21, 2020. Pathology results reported by Stacie Acres RN on 08/15/2020. Electronically Signed   By: Nolon Nations M.D.   On: 08/15/2020 12:38   Result Date: 08/15/2020 CLINICAL DATA:  Patient presents ultrasound-guided biopsy of LEFT and axilla. EXAM: ULTRASOUND GUIDED LEFT BREAST CORE NEEDLE BIOPSY X2 ULTRASOUND-GUIDED LEFT AXILLARY CORE NEEDLE BIOPSY COMPARISON:  Previous exam(s). PROCEDURE: I met with the patient and we discussed the procedure of ultrasound-guided biopsy, including benefits and alternatives. We discussed the high likelihood  of a successful procedure. We discussed the risks of the procedure, including infection, bleeding, tissue injury, clip migration, and inadequate sampling. Informed written consent was given. The usual time-out protocol was performed immediately prior to the procedure. Site 1: LEFT breast 3 o'clock, 3 centimeters from the nipple. Lesion quadrant: LATERAL LEFT breast, coil clip Using sterile technique and 1% lidocaine as local anesthetic, under direct ultrasound visualization, a 12 gauge spring-loaded device was used to perform biopsy of mass in the 3 o'clock location of the LEFT breast using a superolateral approach. At the conclusion of the procedure coil tissue marker clip was deployed into the biopsy cavity. Site 2: LEFT breast 3 o'clock, 5 centimeters from the nipple. Lesion quadrant: LATERAL LEFT breast, heart clip Using sterile technique and 1% lidocaine as local anesthetic, under direct  ultrasound visualization, a 12 gauge spring-loaded device was used to perform biopsy of mass in the 3 o'clock location of the LEFT 5 centimeters from the nipple using a superior to inferior approach. At the conclusion of the procedure heart tissue marker clip was deployed into the biopsy cavity. Site 3: LEFT axillary lymph node. Lesion quadrant: LEFT axilla, TRIBELL clip Using sterile technique and 1% lidocaine as local anesthetic, under direct ultrasound visualization, a 12 gauge spring-loaded device was used to perform biopsy of mass in the 3 o'clock location of the LEFT 5 centimeters from the nipple using a superior to inferior approach. At the conclusion of the procedure heart tissue marker clip was deployed into the biopsy cavity. Follow-up 2-view mammogram was performed and dictated separately. IMPRESSION: Ultrasound guided biopsy of 2 masses in the 3 o'clock location of the LEFT breast and enlarged LEFT axillary lymph node. No apparent complications. Electronically Signed: By: Nolon Nations M.D. 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 This was found to be concordant by Dr. Nolon Nations. 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 This was found to be concordant by Dr. Nolon Nations. 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 Findings may represent an entirely replaced lymph node. This was found to be concordant by Dr. Nolon Nations. Pathology results were discussed with the patient by telephone. The patient reported doing well after the biopsies with tenderness at the sites. Post biopsy instructions and care were reviewed and questions were  answered. The patient was encouraged to call [facility] for any additional concerns. Surgical consultation has been arranged with Dr. [name] at [facility] on August 21, 2020. Pathology results reported by Stacie Acres RN on 08/15/2020. Electronically Signed   By: Nolon Nations M.D.   On: 08/15/2020 12:38   Result Date: 08/15/2020 CLINICAL DATA:  Patient presents ultrasound-guided biopsy of LEFT and axilla. EXAM: ULTRASOUND GUIDED LEFT BREAST CORE NEEDLE BIOPSY X2 ULTRASOUND-GUIDED LEFT AXILLARY CORE NEEDLE BIOPSY COMPARISON:  Previous exam(s). PROCEDURE: I met with the patient and we discussed the procedure of ultrasound-guided biopsy, including benefits and alternatives. We discussed the high likelihood of a successful procedure. We discussed the risks of the procedure, including infection, bleeding, tissue injury, clip migration, and inadequate sampling. Informed written consent was given. The usual time-out protocol was performed immediately prior to  the procedure. Site 1: LEFT breast 3 o'clock, 3 centimeters from the nipple. Lesion quadrant: LATERAL LEFT breast, coil clip Using sterile technique and 1% lidocaine as local anesthetic, under direct ultrasound visualization, a 12 gauge spring-loaded device was used to perform biopsy of mass in the 3 o'clock location of the LEFT breast using a superolateral approach. At the conclusion of the procedure coil tissue marker clip was deployed into the biopsy cavity. Site 2: LEFT breast 3 o'clock, 5 centimeters from the nipple. Lesion quadrant: LATERAL LEFT breast, heart clip Using sterile technique and 1% lidocaine as local anesthetic, under direct ultrasound visualization, a 12 gauge spring-loaded device was used to perform biopsy of mass in the 3 o'clock location of the LEFT 5 centimeters from the nipple using a superior to inferior approach. At the conclusion of the procedure heart tissue marker clip was  deployed into the biopsy cavity. Site 3: LEFT axillary lymph node. Lesion quadrant: LEFT axilla, TRIBELL clip Using sterile technique and 1% lidocaine as local anesthetic, under direct ultrasound visualization, a 12 gauge spring-loaded device was used to perform biopsy of mass in the 3 o'clock location of the LEFT 5 centimeters from the nipple using a superior to inferior approach. At the conclusion of the procedure heart tissue marker clip was deployed into the biopsy cavity. Follow-up 2-view mammogram was performed and dictated separately. IMPRESSION: Ultrasound guided biopsy of 2 masses in the 3 o'clock location of the LEFT breast and enlarged LEFT axillary lymph node. No apparent complications. Electronically Signed: By: Nolon Nations M.D. On: 08/14/2020 09:39      IMPRESSION: Stage IIA (cT1c, cN1, cM0) Left Breast UOQ, Invasive Ductal Carcinoma, ER+ / PR+ / Her2-, Grade 3  The patient would be a good candidate for breast conserving surgery followed by adjuvant radiation therapy.  Given the findings of lymph node metastasis and the importance of covering the axilla with her breast radiation therapy she would not be a candidate for hypofractionated accelerated treatment.  Patient does have large pendulous breasts which have caused upper back and neck pain for many years. This patient did meet with Dr. Iran Planas and the patient is planning bilateral breast reduction surgery a few days after her breast cancer surgery along the left side.  This surgery may also help in terms the patient's skin toxicity during radiation therapy.  Patient does have a family history of ovarian cancer and if she were found to have a deleterious gene then she would in all likelihood reconsider breast conserving surgery and proceed with mastectomy and possibly bilateral mastectomies.  I did discuss with the patient that we would recommend postmastectomy radiation therapy along the left side should she proceed with this  surgical approach given the findings of lymph node metastasis.    PLAN:  1. Breast conserving surgery (assuming genetic testing is negative) 2. Mammaprint testing to determine if chemotherapy would be of any benefit  3. Adjuvant radiation therapy  4. Adjuvant antiestrogen therapy 5. Genetic counseling: Her mother had breast cancer and an aunt had ovarian cancer.  She will be meeting with a genetic counselor today.   Total time spent in this encounter was 60 minutes which included reviewing the patient's most recent mammogram, left breast ultrasound, biopsies, pathology report, consultation with Dr. Lindi Adie, physical examination, and documentation.  ------------------------------------------------  Blair Promise, PhD, MD  This document serves as a record of services personally performed by [name], MD. It was created on his behalf by Clerance Lav, a trained medical scribe.  The creation of this record is based on the scribe's personal observations and the provider's statements to them. This document has been checked and approved by the attending provider.

## 2020-08-28 NOTE — Progress Notes (Signed)
Location of Breast Cancer: Malignant neoplasm of upper-outer quadrant of left breast in female, estrogen receptor positive    Histology per Pathology Report:08/14/2020 1. FLUORESCENCE IN-SITU HYBRIDIZATION Results: GROUP 5: HER2 **NEGATIVE** Equivocal form of amplification of the HER2 gene was detected in the IHC 2+ tissue sample received from this individual. HER2 FISH was performed by a technologist and cell imaging and analysis on the BioView. RATIO OF HER2/CEN17 SIGNALS 1.47 AVERAGE HER2 COPY NUMBER PER CELL 3.30 The ratio of HER2/CEN 17 is within the range < 2.0 of HER2/CEN 17 and a copy number of HER2 signals per cell is <4.0. Arch Pathol Lab Med 1:1,2018 Gillie Manners MD Pathologist, Electronic Signature ( Signed 08/21/2020) 1. PROGNOSTIC INDICATORS Results: IMMUNOHISTOCHEMICAL AND MORPHOMETRIC ANALYSIS PERFORMED MANUALLY The tumor cells are EQUIVOCAL for Her2 (2+). HER2 by FISH will be performed and the results reported separately Estrogen Receptor: 95%, POSITIVE, STRONG STAINING INTENSITY Progesterone Receptor: 95%, POSITIVE, STRONG STAINING INTENSITY Proliferation Marker Ki67: 20% REFERENCE RANGE ESTROGEN RECEPTOR NEGATIVE 0% POSITIVE =>1% REFERENCE RANGE PROGESTERONE RECEPTOR 1 of 3 FINAL for Tracy Dyer, Tracy Dyer ([id number]) ADDITIONAL INFORMATION:(continued) NEGATIVE 0% POSITIVE =>1% All controls stained appropriately Vicente Males MD Pathologist, Electronic Signature ( Signed 08/16/2020) FINAL DIAGNOSIS Diagnosis 1. Breast, left, needle core biopsy, 3 o'clock, 3cmfn, coil clip - INVASIVE DUCTAL CARCINOMA, SEE NOTE 2. Breast, left, needle core biopsy, 3 o'clock, 5cmfn, heart clip - FIBROADENOMA WITH USUAL DUCTAL HYPERPLASIA. SEE NOTE - NEGATIVE FOR CARCINOMA 3. Lymph node, needle/core biopsy, left axilla, tribell clip - INVASIVE DUCTAL CARCINOMA, SEE NOTE Diagnosis Note 1. Carcinoma measures 1.0 cm in greatest linear dimension and appears grade 3. Dr.  Tresa Moore reviewed the case and concurs with the diagnosis. A breast prognostic profile (ER, PR, Ki-67 and HER2) is pending and will be reported in an addendum. [facility] was notified on 08/15/2020. 3. Lymph node tissue is identified. Findings may represent an entirely replaced lymph node. Jaquita Folds MD Pathologist, Electronic 08/14/2020 Diagnosis 1. Breast, left, needle core biopsy, 3 o'clock, 3cmfn, coil clip - INVASIVE DUCTAL CARCINOMA, SEE NOTE 2. Breast, left, needle core biopsy, 3 o'clock, 5cmfn, heart clip - FIBROADENOMA WITH USUAL DUCTAL HYPERPLASIA. SEE NOTE - NEGATIVE FOR CARCINOMA 3. Lymph node, needle/core biopsy, left axilla, tribell clip - INVASIVE DUCTAL CARCINOMA, SEE NOTE   Receptor Status: ER(95t), PR (95t), Her2-neu (2t), Ki-(20%)  Did patient present with symptoms (if so, please note symptoms) or was this found on screening mammography?:   Past/Anticipated interventions by surgeon, if any  Past/Anticipated interventions by medical oncology, if any: Chemotherapy  Recommendations: 1. Breast conserving surgery followed by 2. Mammaprint testing to determine if chemotherapy would be of any benefit followed by 3. Adjuvant radiation therapy followed by 4. Adjuvant antiestrogen therapy Lymphedema issues, if any:   None  Pain issues, if any:  None  SAFETY ISSUES:  Prior radiation? No  Pacemaker/ICD? No  Possible current pregnancy? No  Is the patient on methotrexate? No  Current Complaints / other details:      Sherrlyn Hock, RN 08/28/2020,8:55 AM

## 2020-08-28 NOTE — Telephone Encounter (Signed)
Received mammaprint results of HIGH RISK on core bx. Physician team notified. Called pt with results and discussed chemo would be recommended. Scheduled and confirmed appt with Dr. Lindi Adie for 1/28 Pt upset by news and was tearful. Validated feelings. Referral sent for SW to f/u.

## 2020-08-28 NOTE — Progress Notes (Signed)
Location of Breast Cancer: Malignant neoplasm of upper-outer quadrant of left breast in female, estrogen receptor positive    Histology per Pathology Report:08/14/2020 1. FLUORESCENCE IN-SITU HYBRIDIZATION Results: GROUP 5: HER2 **NEGATIVE** Equivocal form of amplification of the HER2 gene was detected in the IHC 2+ tissue sample received from this individual. HER2 FISH was performed by a technologist and cell imaging and analysis on the BioView. RATIO OF HER2/CEN17 SIGNALS 1.47 AVERAGE HER2 COPY NUMBER PER CELL 3.30 The ratio of HER2/CEN 17 is within the range < 2.0 of HER2/CEN 17 and a copy number of HER2 signals per cell is <4.0. Arch Pathol Lab Med 1:1,2018 Gillie Manners MD Pathologist, Electronic Signature ( Signed 08/21/2020) 1. PROGNOSTIC INDICATORS Results: IMMUNOHISTOCHEMICAL AND MORPHOMETRIC ANALYSIS PERFORMED MANUALLY The tumor cells are EQUIVOCAL for Her2 (2+). HER2 by FISH will be performed and the results reported separately Estrogen Receptor: 95%, POSITIVE, STRONG STAINING INTENSITY Progesterone Receptor: 95%, POSITIVE, STRONG STAINING INTENSITY Proliferation Marker Ki67: 20% REFERENCE RANGE ESTROGEN RECEPTOR NEGATIVE 0% POSITIVE =>1% REFERENCE RANGE PROGESTERONE RECEPTOR 1 of 3 FINAL for [place], [place] GAILEY ([id number]) ADDITIONAL INFORMATION:(continued) NEGATIVE 0% POSITIVE =>1% All controls stained appropriately Vicente Males MD Pathologist, Electronic Signature ( Signed 08/16/2020) FINAL DIAGNOSIS Diagnosis 1. Breast, left, needle core biopsy, 3 o'clock, 3cmfn, coil clip - INVASIVE DUCTAL CARCINOMA, SEE NOTE 2. Breast, left, needle core biopsy, 3 o'clock, 5cmfn, heart clip - FIBROADENOMA WITH USUAL DUCTAL HYPERPLASIA. SEE NOTE - NEGATIVE FOR CARCINOMA 3. Lymph node, needle/core biopsy, left axilla, tribell clip - INVASIVE DUCTAL CARCINOMA, SEE NOTE Diagnosis Note 1. Carcinoma measures 1.0 cm in greatest linear dimension and appears grade 3. Dr.  Tresa Moore reviewed the case and concurs with the diagnosis. A breast prognostic profile (ER, PR, Ki-67 and HER2) is pending and will be reported in an addendum. The [facility] was notified on 08/15/2020. 3. Lymph node tissue is identified. Findings may represent an entirely replaced lymph node. Jaquita Folds MD Pathologist, Electronic 08/14/2020 Diagnosis 1. Breast, left, needle core biopsy, 3 o'clock, 3cmfn, coil clip - INVASIVE DUCTAL CARCINOMA, SEE NOTE 2. Breast, left, needle core biopsy, 3 o'clock, 5cmfn, heart clip - FIBROADENOMA WITH USUAL DUCTAL HYPERPLASIA. SEE NOTE - NEGATIVE FOR CARCINOMA 3. Lymph node, needle/core biopsy, left axilla, tribell clip - INVASIVE DUCTAL CARCINOMA, SEE NOTE   Receptor Status: ER(95t), PR (95t), Her2-neu (2t), Ki-(20%)  Did patient present with symptoms (if so, please note symptoms) or was this found on screening mammography?:   Past/Anticipated interventions by surgeon, if any  Past/Anticipated interventions by medical oncology, if any: Chemotherapy  Recommendations: 1. Breast conserving surgery followed by 2. Mammaprint testing to determine if chemotherapy would be of any benefit followed by 3. Adjuvant radiation therapy followed by 4. Adjuvant antiestrogen therapy Lymphedema issues, if any:    Pain issues, if any:  No  SAFETY ISSUES:  Prior radiation?  No  Pacemaker/ICD? No  Possible current pregnancy?No  Is the patient on methotrexate? No  Current Complaints / other details:  Vitals:   08/28/20 0856  BP: 139/71  Pulse: 67  Resp: 18  Temp: (!) 97 F (36.1 C)  TempSrc: Temporal  SpO2: 100%  Weight: 95.9 kg  Height: 5' 6"  (1.676 m)       Sherrlyn Hock, RN 08/28/2020,8:58 AM

## 2020-08-28 NOTE — Progress Notes (Signed)
REFERRING PROVIDER: [name], MD [address] [place],  [place] [address]  PRIMARY PROVIDER:  [name], MD  PRIMARY REASON FOR VISIT:  1. Malignant neoplasm of upper-outer quadrant of left breast in female, estrogen receptor positive ([place])   2. Family history of melanoma   3. Family history of breast cancer   4. Family history of ovarian cancer   5. Family history of lung cancer    I connected with Tracy Dyer on 08/28/2020 at 11:00 AM EDT by MyChart video conference and verified that I am speaking with the correct person using two identifiers.    Patient location: home Provider location: [place]:   Tracy Dyer, a 50 y.o. female, was seen for a [place] cancer genetics consultation at the request of Dr. Lindi Dyer due to a personal and family history of cancer.  Tracy Dyer presents to clinic today to discuss the possibility of a hereditary predisposition to cancer, genetic testing, and to further clarify her future cancer risks, as well as potential cancer risks for family members.   In 2021, at the age of 11, Tracy Dyer was diagnosed with invasive ductal carcinoma of the left breast, ER/PR+, Her2-. The treatment plan includes possible chemotherapy, lumpectomy scheduled 2/3, adjuvant radiation and adjuvant anti-estrogen therapy.    CANCER HISTORY:  Oncology History  Malignant neoplasm of upper-outer quadrant of left breast in female, estrogen receptor positive ([place])  08/21/2020 Initial Diagnosis   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%, PR 95%, HER2 equivocal, FISH pending, Ki-67 20%   08/21/2020 Cancer Staging   Staging form: Breast, AJCC 8th  Edition - Clinical stage from 08/21/2020: Stage IIA (cT1c, cN1, cM0, G3, ER+, PR+, HER2-) - Signed by Tracy Lose, MD on 08/21/2020      RISK FACTORS:  Menarche was at age 18.  First live birth at age 57.  OCP use for approximately 0 years.  Ovaries intact: yes.  Hysterectomy: no.  Menopausal status: premenopausal.  HRT use: 0 years. Colonoscopy: yes; polyp. Mammogram within the last year: yes. Number of breast biopsies: 3. Up to date with pelvic exams: yes. Any excessive radiation exposure in the past: no  Past Medical History:  Diagnosis Date  . Family history of breast cancer   . Family history of lung cancer   . Family history of ovarian cancer     Past Surgical History:  Procedure Laterality Date  . BREAST BIOPSY Left 2019  . BREAST EXCISIONAL BIOPSY Left   . CHOLECYSTECTOMY  2008  . WISDOM TOOTH EXTRACTION      Social History   Socioeconomic History  . Marital status: Married    Spouse name: Not on file  . Number of children: Not on file  . Years of education: Not on file  . Highest education level: Not on file  Occupational History  . Not on file  Tobacco Use  . Smoking status: Never Smoker  . Smokeless tobacco: Never Used  Vaping Use  . Vaping Use: Never used  Substance and Sexual Activity  . Alcohol use: Yes  . Drug use: Never  . Sexual activity: Not on file  Other Topics Concern  . Not on file  Social History Narrative  .  Not on file   Social Determinants of Health   Financial Resource Strain: Not on file  Food Insecurity: Not on file  Transportation Needs: Not on file  Physical Activity: Not on file  Stress: Not on file  Social Connections: Not on file     FAMILY HISTORY:  We obtained a detailed, 4-generation family history.  Significant diagnoses are listed below: Family History  Problem Relation Age of Onset  . Eczema Father   . Allergic rhinitis Brother   . Breast cancer Mother        in 80's  . Cancer Maternal Uncle         unk type  . Lung cancer Maternal Grandfather 17  . Heart Problems Paternal Grandfather   . Melanoma Cousin   . Ovarian cancer Paternal Great-grandmother   . Cancer Paternal Great-grandmother        GYN cancer, possibly ovarian?  . Cancer Maternal Aunt        half aunts/uncles x5- rare cancers   Tracy Dyer has 2 sons, no cancers. She has 1 brother, 1 sister, no cancers.  Tracy Dyer mother had breast cancer in her 45s and is living at 45. Patient had 1 maternal aunt, no cancers. She has 1 maternal half-uncle through her mother's mother, he currently has cancer, unknown type, and his daughter had melanoma in her 66s-40s. Patient also has 5 maternal half aunt/uncles through her mother's father who all died of rare cancers, some in their 82s-30s, patient will try to confirm the cancer types and update the history. Patient's maternal grandmother died at 59, grandfather died at 24 of a heart attack.  Tracy Dyer father is living at 29. No history of cancer for his siblings or patient's first cousins. Paternal grandmother died at 39, her mother had ovarian cancer. Paternal grandfather and his brothers all had lung cancer, he died at 49 and his mother also had a GYN cancer, possibly ovarian.   Tracy Dyer is unaware of previous family history of genetic testing for hereditary cancer risks. Patient's ancestors are of English/Irish/Scandinavian descent.There is no reported Ashkenazi Jewish ancestry. There is no known consanguinity.    GENETIC COUNSELING ASSESSMENT: Tracy Dyer is a 50 y.o. female with a personal and family history of breast cancer which is somewhat suggestive of a hereditary cancer syndrome and predisposition to cancer. We, therefore, discussed and recommended the following at today's visit.   DISCUSSION: We discussed that approximately 5-10% of breast cancer is hereditary  Most cases of hereditary breast cancer are associated with BRCA1/BRCA2 genes, although there are other genes  associated with hereditary breast cancer as well including ATM, PALB2, CHEK2 .  We discussed that testing is beneficial for several reasons including surgical decision-making for breast cancer, knowing about other cancer risks, identifying potential screening and risk-reduction options that may be appropriate, and to understand if other family members could be at risk for cancer and allow them to undergo genetic testing.   We reviewed the characteristics, features and inheritance patterns of hereditary cancer syndromes. We also discussed genetic testing, including the appropriate family members to test, the process of testing, insurance coverage and turn-around-time for results. We discussed the implications of a negative, positive and/or variant of uncertain significant result. In order to get genetic test results in a timely manner so that Tracy Dyer can use these genetic test results for surgical decisions, we recommended Tracy Dyer pursue genetic testing for the [facility] Breast Cancer STAT Panel. Once complete, we recommend Ms.  Dyer pursue reflex genetic testing to the Multi-Cancer gene panel.   The STAT Breast cancer panel offered by [facility] includes sequencing and rearrangement analysis for the following 9 genes:  ATM, BRCA1, BRCA2, CDH1, CHEK2, PALB2, PTEN, STK11 and TP53.    The Multi-Cancer Panel offered by [facility] includes sequencing and/or deletion duplication testing of the following 85 genes: AIP, ALK, APC, ATM, AXIN2,BAP1,  BARD1, BLM, BMPR1A, BRCA1, BRCA2, BRIP1, CASR, CDC73, CDH1, CDK4, CDKN1B, CDKN1C, CDKN2A (p14ARF), CDKN2A (p16INK4a), CEBPA, CHEK2, CTNNA1, DICER1, DIS3L2, EGFR (c.2369C>T, p.Thr790Met variant only), EPCAM (Deletion/duplication testing only), FH, FLCN, GATA2, GPC3, GREM1 (Promoter region deletion/duplication testing only), HOXB13 (c.251G>A, p.Gly84Glu), HRAS, KIT, MAX, MEN1, MET, MITF (c.952G>A, p.Glu318Lys variant only), MLH1, MSH2, MSH3, MSH6, MUTYH, NBN, NF1, NF2, NTHL1,  PALB2, PDGFRA, PHOX2B, PMS2, POLD1, POLE, POT1, PRKAR1A, PTCH1, PTEN, RAD50, RAD51C, RAD51D, RB1, RECQL4, RET, RNF43, RUNX1, SDHAF2, SDHA (sequence changes only), SDHB, SDHC, SDHD, SMAD4, SMARCA4, SMARCB1, SMARCE1, STK11, SUFU, TERC, TERT, TMEM127, TP53, TSC1, TSC2, VHL, WRN and WT1.   Based on Tracy Dyer's personal and family history of cancer, she meets medical criteria for genetic testing. Despite that she meets criteria, she may still have an out of pocket cost.   PLAN: After considering the risks, benefits, and limitations, Tracy Dyer provided informed consent to pursue genetic testing and the blood sample was sent to [facility] for analysis of the Breast Cancer STAT Panel + Multi-Cancer Panel. Results should be available within approximately 1 weeks' time, at which point they will be disclosed by telephone to Tracy Dyer, as will any additional recommendations warranted by these results. Tracy Dyer will receive a summary of her genetic counseling visit and a copy of her results once available. This information will also be available in Epic.   Tracy Dyer questions were answered to her satisfaction today. Our contact information was provided should additional questions or concerns arise. Thank you for the referral and allowing [place] to share in the care of your patient.   Faith Rogue, MS, [facility] Genetic Counselor [email or web address] Phone: [phone]  The patient was seen for a total of 40 minutes in virtual genetic counseling. [employer] intern Fredrik Rigger was present and assisted with this case.  Dr. Grayland Ormond was available for discussion regarding this case.   _______________________________________________________________________ For Office Staff:  Number of people involved in session: 2 Was an Intern/ student involved with case: yes

## 2020-08-29 ENCOUNTER — Other Ambulatory Visit: Payer: Self-pay | Admitting: General Surgery

## 2020-08-29 NOTE — Progress Notes (Signed)
Patient Care Team: [name], MD as PCP - General (Family Medicine)  DIAGNOSIS:    ICD-10-CM   1. Malignant neoplasm of upper-outer quadrant of left breast in female, estrogen receptor positive ([place])  C50.412    Z17.0     SUMMARY OF ONCOLOGIC HISTORY: Oncology History  Malignant neoplasm of upper-outer quadrant of left breast in female, estrogen receptor positive ([place])  08/21/2020 Initial Diagnosis   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%, PR 95%, HER2 equivocal, FISH pending, Ki-67 20%   08/21/2020 Cancer Staging   Staging form: Breast, AJCC 8th Edition - Clinical stage from 08/21/2020: Stage IIA (cT1c, cN1, cM0, G3, ER+, PR+, HER2-) - Signed by [name], MD on 08/21/2020     CHIEF COMPLIANT: Follow-up to discuss Mammaprint results and chemotherapy  INTERVAL HISTORY: Tracy Dyer is a 50 y.o. with above-mentioned history of left breast cancer. Mammaprint testing showed she is high risk. She presents to the clinic today to discuss neoadjuvant chemotherapy.  She is in tears today worried and anxious about chemotherapy.  She is accompanied by her husband.  ALLERGIES:  is allergic to sulfa antibiotics, hydromorphone, oxycodone, propoxyphene, and sulfamethoxazole.  MEDICATIONS:  Current Outpatient Medications  Medication Sig Dispense Refill  . clonazePAM (KLONOPIN) 1 MG tablet Take 1 mg by mouth daily.    . fluticasone (FLONASE) 50 MCG/ACT nasal spray Place 1 spray into both nostrils daily as needed for allergies.    Marland Kitchen l-methylfolate-B6-B12 (METANX) 3-35-2 MG TABS tablet Take 1 tablet by mouth daily.    Marland Kitchen MAGNESIUM CITRATE PO Take 405 mg by mouth daily. 135 mg    . Multiple  Vitamins-Minerals (MULTIVITAMIN WITH MINERALS) tablet Take 1 tablet by mouth daily.    . sertraline (ZOLOFT) 100 MG tablet Take 100 mg by mouth at bedtime.     Current Facility-Administered Medications  Medication Dose Route Frequency Provider Last Rate Last Admin  . methylPREDNISolone acetate (DEPO-MEDROL) injection 40 mg  40 mg Intra-articular Once Hilts, Michael, MD        PHYSICAL EXAMINATION: ECOG PERFORMANCE STATUS: 1 - Symptomatic but completely ambulatory  Vitals:   08/30/20 1242  BP: (!) 118/52  Pulse: 67  Resp: 17  Temp: 98.1 F (36.7 C)  SpO2: 100%   Filed Weights   08/30/20 1242  Weight: 212 lb 12.8 oz (96.5 kg)    LABORATORY DATA:  ASSESSMENT & PLAN:  Malignant neoplasm of upper-outer quadrant of left breast in female, estrogen receptor positive ([place]) 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%, PR 95%, HER2 equivocal, FISH pending, Ki-67 20% T1CN1 stage IIa MammaPrint: High risk: Luminal type B, probability of path CR 6% with chemo and antiestrogen therapy predicted benefit of treatment at 5 years 94.6%, average 10-year risk of recurrence untreated: 29%  Treatment plan: 1.  Neoadjuvant chemotherapy with dose dense Adriamycin and Cytoxan followed by Taxol weekly x12 2. breast conserving surgery with sentinel lymph node and targeted node dissection 3.  Adjuvant radiation therapy 4.  Followed by adjuvant antiestrogen therapy.  Chemotherapy Counseling: I discussed the risks and benefits of chemotherapy including the risks of nausea/ vomiting, risk of infection from low WBC count, fatigue due to chemo or anemia, bruising or bleeding due to low platelets, mouth sores, loss/ change in taste  and decreased appetite. Liver and kidney function will be monitored through out chemotherapy as abnormalities in liver and kidney function may be a side effect of treatment. Cardiac dysfunction due to Adriamycin and neuropathy risk from Taxol were discussed in detail. Risk of permanent bone marrow dysfunction and leukemia due to chemo were also discussed.  Return to clinic after port has been placed to start chemotherapy.  No orders of the defined types were placed in this encounter.  The patient has a good understanding of the overall plan. she agrees with it. she will call with any problems that may develop before the next visit here.  Total time spent: 45 mins including face to face time and time spent for planning, charting and coordination of care  [name], MD 08/30/2020  I, Cloyde Reams Dorshimer, am acting as scribe for Dr. [name].  I have reviewed the above documentation for accuracy and completeness, and I agree with the above.

## 2020-08-30 ENCOUNTER — Telehealth: Payer: Self-pay | Admitting: Licensed Clinical Social Worker

## 2020-08-30 ENCOUNTER — Other Ambulatory Visit: Payer: Self-pay

## 2020-08-30 ENCOUNTER — Inpatient Hospital Stay: Payer: BC Managed Care – PPO | Admitting: Hematology and Oncology

## 2020-08-30 VITALS — BP 118/52 | HR 67 | Temp 98.1°F | Resp 17 | Ht 66.0 in | Wt 212.8 lb

## 2020-08-30 DIAGNOSIS — Z79899 Other long term (current) drug therapy: Secondary | ICD-10-CM | POA: Diagnosis not present

## 2020-08-30 DIAGNOSIS — Z170 Estrogen receptor positive status [ER+]: Secondary | ICD-10-CM

## 2020-08-30 DIAGNOSIS — C50412 Malignant neoplasm of upper-outer quadrant of left female breast: Secondary | ICD-10-CM | POA: Diagnosis not present

## 2020-08-30 DIAGNOSIS — F419 Anxiety disorder, unspecified: Secondary | ICD-10-CM | POA: Diagnosis not present

## 2020-08-30 MED ORDER — PROCHLORPERAZINE MALEATE 10 MG PO TABS: 10.0000 mg | ORAL_TABLET | Freq: Four times a day (QID) | ORAL | 1 refills | Status: DC | PRN

## 2020-08-30 MED ORDER — ONDANSETRON HCL 8 MG PO TABS: 8.0000 mg | ORAL_TABLET | Freq: Two times a day (BID) | ORAL | 1 refills | Status: DC | PRN

## 2020-08-30 MED ORDER — DEXAMETHASONE 4 MG PO TABS: 4.0000 mg | ORAL_TABLET | Freq: Every day | ORAL | 0 refills | Status: DC

## 2020-08-30 MED ORDER — LIDOCAINE-PRILOCAINE 2.5-2.5 % EX CREA: TOPICAL_CREAM | CUTANEOUS | 3 refills | Status: DC

## 2020-08-30 NOTE — Telephone Encounter (Signed)
[employer]  CSW contacted patient by phone to introduce self and services. Patient agreed to have CSW check-in during first chemo treatment. Call disconnected when CSW was explaining other support programs. Attempted to call pt back, went to VM. CSW left direct contact information and will see patient in-person during infusion.   Christeen Douglas, LCSW

## 2020-08-30 NOTE — Progress Notes (Signed)
START ON PATHWAY REGIMEN - Breast     Cycles 1 through 4: A cycle is every 14 days:     Doxorubicin      Cyclophosphamide      Pegfilgrastim-xxxx    Cycles 5 through 16: A cycle is every 7 days:     Paclitaxel   **Always confirm dose/schedule in your pharmacy ordering system**  Patient Characteristics: Preoperative or Nonsurgical Candidate (Clinical Staging), Neoadjuvant Therapy followed by Surgery, Invasive Disease, Chemotherapy, HER2 Negative/Unknown/Equivocal, ER Positive Therapeutic Status: Preoperative or Nonsurgical Candidate (Clinical Staging) AJCC M Category: cM0 AJCC Grade: G3 Breast Surgical Plan: Neoadjuvant Therapy followed by Surgery ER Status: Positive (+) AJCC 8 Stage Grouping: IIA HER2 Status: Negative (-) AJCC T Category: cT1c AJCC N Category: cN1 PR Status: Positive (+) Intent of Therapy: Curative Intent, Discussed with Patient

## 2020-08-30 NOTE — Assessment & Plan Note (Signed)
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%, PR 95%, HER2 equivocal, FISH pending, Ki-67 20% T1CN1 stage IIa MammaPrint: High risk: Luminal type B, probability of path CR 6% with chemo and antiestrogen therapy predicted benefit of treatment at 5 years 94.6%, average 10-year risk of recurrence untreated: 29%  Treatment plan: 1.  Neoadjuvant chemotherapy with dose dense Adriamycin and Cytoxan followed by Taxol weekly x12 2. breast conserving surgery with sentinel lymph node and targeted node dissection 3.  Adjuvant radiation therapy 4.  Followed by adjuvant antiestrogen therapy.  Return to clinic after port has been placed to start chemotherapy.

## 2020-09-02 ENCOUNTER — Encounter (HOSPITAL_COMMUNITY): Payer: Self-pay | Admitting: General Surgery

## 2020-09-02 ENCOUNTER — Other Ambulatory Visit (HOSPITAL_COMMUNITY)
Admission: RE | Admit: 2020-09-02 | Discharge: 2020-09-02 | Disposition: A | Discharge status: Home / Self Care | Payer: BC Managed Care – PPO | Source: Ambulatory Visit | Attending: General Surgery | Admitting: General Surgery

## 2020-09-02 ENCOUNTER — Encounter: Payer: Self-pay | Admitting: Hematology and Oncology

## 2020-09-02 ENCOUNTER — Other Ambulatory Visit: Payer: Self-pay

## 2020-09-02 ENCOUNTER — Other Ambulatory Visit: Payer: Self-pay | Admitting: *Deleted

## 2020-09-02 ENCOUNTER — Encounter: Payer: Self-pay | Admitting: *Deleted

## 2020-09-02 DIAGNOSIS — Z882 Allergy status to sulfonamides status: Secondary | ICD-10-CM | POA: Diagnosis not present

## 2020-09-02 DIAGNOSIS — Z170 Estrogen receptor positive status [ER+]: Secondary | ICD-10-CM

## 2020-09-02 DIAGNOSIS — C773 Secondary and unspecified malignant neoplasm of axilla and upper limb lymph nodes: Secondary | ICD-10-CM | POA: Diagnosis not present

## 2020-09-02 DIAGNOSIS — Z20822 Contact with and (suspected) exposure to covid-19: Secondary | ICD-10-CM | POA: Insufficient documentation

## 2020-09-02 DIAGNOSIS — Z01812 Encounter for preprocedural laboratory examination: Secondary | ICD-10-CM | POA: Insufficient documentation

## 2020-09-02 DIAGNOSIS — C50912 Malignant neoplasm of unspecified site of left female breast: Secondary | ICD-10-CM | POA: Diagnosis not present

## 2020-09-02 DIAGNOSIS — C50412 Malignant neoplasm of upper-outer quadrant of left female breast: Secondary | ICD-10-CM

## 2020-09-02 LAB — SARS CORONAVIRUS 2 (TAT 6-24 HRS): SARS Coronavirus 2: NEGATIVE

## 2020-09-02 NOTE — Progress Notes (Signed)
PCP: [name], MD  ERAS Protcol - yes 0430  COVID TEST- 09/02/20  Anesthesia review: n/a  -------------  SDW INSTRUCTIONS:  Your procedure is scheduled on Thursday 09/05/20. Please report to [facility] Main Entrance "A" at 05:30 A.M., and check in at the Admitting office. Call this number if you have problems the morning of surgery: [phone]   Remember: Do not eat after midnight the night before your surgery  You may drink clear liquids until 04:30 A.M. the morning of your surgery.   Clear liquids allowed are: Water, Non-Citrus Juices (without pulp), Carbonated Beverages, Clear Tea, Black Coffee Only, and Gatorade   Medications to take morning of surgery with a sip of water include: clonazePAM (KLONOPIN)  If needed: fluticasone (FLONASE)  As of today, STOP taking any Aspirin (unless otherwise instructed by your surgeon), Aleve, Naproxen, Ibuprofen, Motrin, Advil, Goody's, BC's, all herbal medications, fish oil, and all vitamins.    The Morning of Surgery Do not wear jewelry, make-up or nail polish. Do not wear lotions, powders, or perfumes, or deodorant Do not shave 48 hours prior to surgery.  Do not bring valuables to the hospital. [facility] is not responsible for any belongings or valuables. If you are a smoker, DO NOT Smoke 24 hours prior to surgery If you wear a CPAP at night please bring your mask the morning of surgery  Remember that you must have someone to transport you home after your surgery, and remain with you for 24 hours if you are discharged the same day. Please bring cases for contacts, glasses, hearing aids, dentures or bridgework because it cannot be worn into surgery.   Patients discharged the day of surgery will not be allowed to drive home.   Please shower the NIGHT BEFORE SURGERY and the MORNING OF SURGERY with DIAL Soap. Wear comfortable clothes the morning of surgery. Oral Hygiene is also important to reduce your risk of infection.  Remember -  BRUSH YOUR TEETH THE MORNING OF SURGERY WITH YOUR REGULAR TOOTHPASTE  Patient denies shortness of breath, fever, cough and chest pain.

## 2020-09-03 ENCOUNTER — Other Ambulatory Visit: Payer: Self-pay | Admitting: Hematology and Oncology

## 2020-09-03 ENCOUNTER — Encounter: Payer: Self-pay | Admitting: *Deleted

## 2020-09-03 MED ORDER — LORAZEPAM 0.5 MG PO TABS: 0.5000 mg | ORAL_TABLET | Freq: Once | ORAL | 0 refills | Status: DC | PRN

## 2020-09-04 ENCOUNTER — Encounter: Payer: Self-pay | Admitting: Licensed Clinical Social Worker

## 2020-09-04 ENCOUNTER — Inpatient Hospital Stay: Payer: BC Managed Care – PPO | Attending: Hematology and Oncology

## 2020-09-04 ENCOUNTER — Ambulatory Visit
Admission: RE | Admit: 2020-09-04 | Discharge: 2020-09-04 | Disposition: A | Discharge status: Home / Self Care | Payer: BC Managed Care – PPO | Source: Ambulatory Visit | Attending: Hematology and Oncology | Admitting: Hematology and Oncology

## 2020-09-04 ENCOUNTER — Ambulatory Visit (HOSPITAL_COMMUNITY)
Admission: RE | Admit: 2020-09-04 | Discharge: 2020-09-04 | Disposition: A | Discharge status: Home / Self Care | Payer: BC Managed Care – PPO | Source: Ambulatory Visit | Attending: Hematology and Oncology | Admitting: Hematology and Oncology

## 2020-09-04 ENCOUNTER — Other Ambulatory Visit: Payer: Self-pay

## 2020-09-04 ENCOUNTER — Telehealth: Payer: Self-pay | Admitting: Licensed Clinical Social Worker

## 2020-09-04 ENCOUNTER — Encounter: Payer: Self-pay | Admitting: Hematology and Oncology

## 2020-09-04 ENCOUNTER — Inpatient Hospital Stay: Payer: BC Managed Care – PPO

## 2020-09-04 ENCOUNTER — Ambulatory Visit: Payer: Self-pay | Admitting: Licensed Clinical Social Worker

## 2020-09-04 DIAGNOSIS — Z5111 Encounter for antineoplastic chemotherapy: Secondary | ICD-10-CM | POA: Diagnosis not present

## 2020-09-04 DIAGNOSIS — Z0189 Encounter for other specified special examinations: Secondary | ICD-10-CM

## 2020-09-04 DIAGNOSIS — Z803 Family history of malignant neoplasm of breast: Secondary | ICD-10-CM | POA: Diagnosis not present

## 2020-09-04 DIAGNOSIS — Z170 Estrogen receptor positive status [ER+]: Secondary | ICD-10-CM

## 2020-09-04 DIAGNOSIS — I081 Rheumatic disorders of both mitral and tricuspid valves: Secondary | ICD-10-CM | POA: Insufficient documentation

## 2020-09-04 DIAGNOSIS — Z8041 Family history of malignant neoplasm of ovary: Secondary | ICD-10-CM | POA: Diagnosis not present

## 2020-09-04 DIAGNOSIS — C50412 Malignant neoplasm of upper-outer quadrant of left female breast: Secondary | ICD-10-CM | POA: Diagnosis not present

## 2020-09-04 DIAGNOSIS — K219 Gastro-esophageal reflux disease without esophagitis: Secondary | ICD-10-CM | POA: Diagnosis not present

## 2020-09-04 DIAGNOSIS — Z5189 Encounter for other specified aftercare: Secondary | ICD-10-CM | POA: Insufficient documentation

## 2020-09-04 DIAGNOSIS — Z79899 Other long term (current) drug therapy: Secondary | ICD-10-CM | POA: Insufficient documentation

## 2020-09-04 DIAGNOSIS — C50912 Malignant neoplasm of unspecified site of left female breast: Secondary | ICD-10-CM | POA: Diagnosis not present

## 2020-09-04 DIAGNOSIS — Z01818 Encounter for other preprocedural examination: Secondary | ICD-10-CM | POA: Insufficient documentation

## 2020-09-04 DIAGNOSIS — Z1379 Encounter for other screening for genetic and chromosomal anomalies: Secondary | ICD-10-CM | POA: Insufficient documentation

## 2020-09-04 DIAGNOSIS — C773 Secondary and unspecified malignant neoplasm of axilla and upper limb lymph nodes: Secondary | ICD-10-CM | POA: Diagnosis not present

## 2020-09-04 DIAGNOSIS — Z801 Family history of malignant neoplasm of trachea, bronchus and lung: Secondary | ICD-10-CM | POA: Diagnosis not present

## 2020-09-04 LAB — COMPREHENSIVE METABOLIC PANEL
ALT: 16 U/L (ref 0–44)
AST: 18 U/L (ref 15–41)
Albumin: 3.7 g/dL (ref 3.5–5.0)
Alkaline Phosphatase: 77 U/L (ref 38–126)
Anion gap: 6 (ref 5–15)
BUN: 11 mg/dL (ref 6–20)
CO2: 26 mmol/L (ref 22–32)
Calcium: 8.4 mg/dL — ABNORMAL LOW (ref 8.9–10.3)
Chloride: 109 mmol/L (ref 98–111)
Creatinine, Ser: 0.81 mg/dL (ref 0.44–1.00)
GFR, Estimated: >60 mL/min (ref >60)
Glucose, Bld: 114 mg/dL — ABNORMAL HIGH (ref 70–99)
Potassium: 4.4 mmol/L (ref 3.5–5.1)
Sodium: 141 mmol/L (ref 135–145)
Total Bilirubin: 0.3 mg/dL (ref 0.3–1.2)
Total Protein: 6.4 g/dL — ABNORMAL LOW (ref 6.5–8.1)

## 2020-09-04 LAB — CBC WITH DIFFERENTIAL/PLATELET
Abs Immature Granulocytes: 0.01 10*3/uL (ref 0.00–0.07)
Basophils Absolute: 0.1 10*3/uL (ref 0.0–0.1)
Basophils Relative: 1 %
Eosinophils Absolute: 0.1 10*3/uL (ref 0.0–0.5)
Eosinophils Relative: 3 %
HCT: 40.7 % (ref 36.0–46.0)
Hemoglobin: 13.9 g/dL (ref 12.0–15.0)
Immature Granulocytes: 0 %
Lymphocytes Relative: 27 %
Lymphs Abs: 1.4 10*3/uL (ref 0.7–4.0)
MCH: 31.7 pg (ref 26.0–34.0)
MCHC: 34.2 g/dL (ref 30.0–36.0)
MCV: 92.7 fL (ref 80.0–100.0)
Monocytes Absolute: 0.4 10*3/uL (ref 0.1–1.0)
Monocytes Relative: 8 %
Neutro Abs: 3.2 10*3/uL (ref 1.7–7.7)
Neutrophils Relative %: 61 %
Platelets: 243 10*3/uL (ref 150–400)
RBC: 4.39 MIL/uL (ref 3.87–5.11)
RDW: 13.2 % (ref 11.5–15.5)
WBC: 5.3 10*3/uL (ref 4.0–10.5)
nRBC: 0 % (ref 0.0–0.2)

## 2020-09-04 LAB — ECHOCARDIOGRAM COMPLETE
Area-P 1/2: 2.8 cm2
S' Lateral: 3.02 cm

## 2020-09-04 IMAGING — MR MR BREAST BILAT WO/W CM
8 of 12 series · 30 of 48 positions shown · IV contrast (Multihance)
Comparison: Previous exam(s).

CLINICAL DATA: 49-year-old female with newly diagnosed left breast
cancer, metastatic to axillary lymph nodes.

LABS:  None performed on site.
EXAM:
BILATERAL BREAST MRI WITH AND WITHOUT CONTRAST
TECHNIQUE: Multiplanar, multisequence MR images of both breasts were obtained
prior to and following the intravenous administration of 10 ml of
Gadavist.

[Series 2: t2_tirm_tra ipat (a-p) · axial · 3.0mm · 0.78mm/px · 1 of 61 slices shown]
[im 1/61]
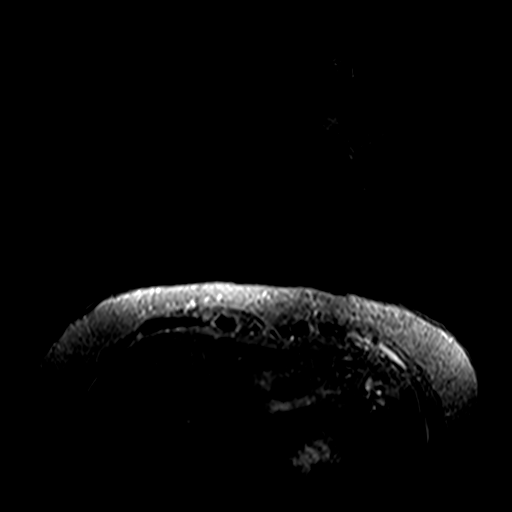

[Series 3: fl3d pre-cm no · axial · non-contrast · 1.2mm · 1.04mm/px · z∈[-83,+108]mm · 5 of 160 slices shown]
[im 1/160]
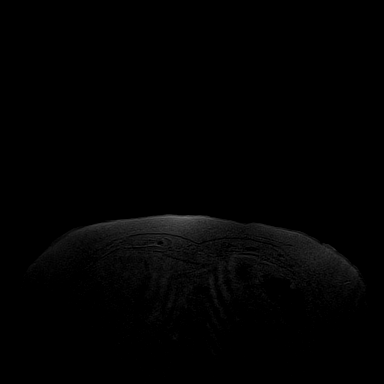
[im 40/160]
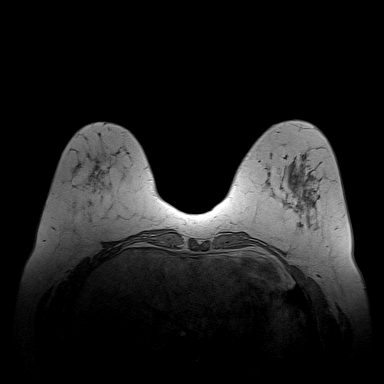
[im 80/160]
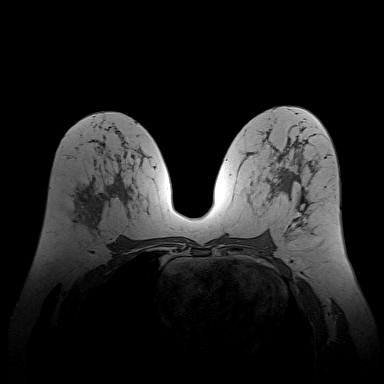
[im 120/160]
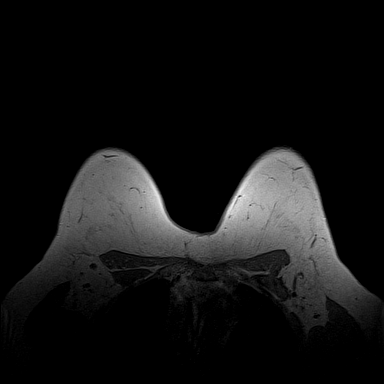
[im 160/160]
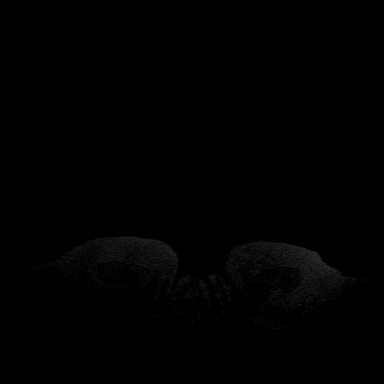

[Series 4: fl3d pre-cm · axial · non-contrast · 1.2mm · 1.04mm/px · z∈[-83,+108]mm · 5 of 160 slices shown]
[im 1/160]
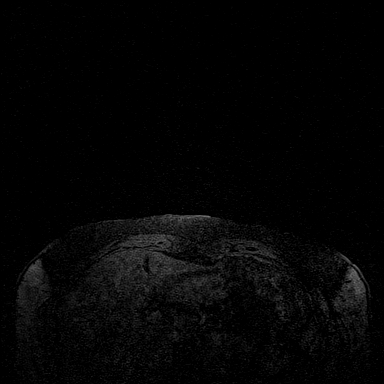
[im 40/160]
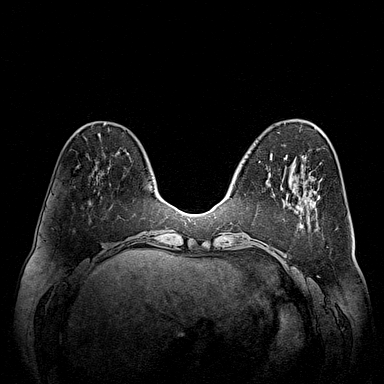
[im 80/160]
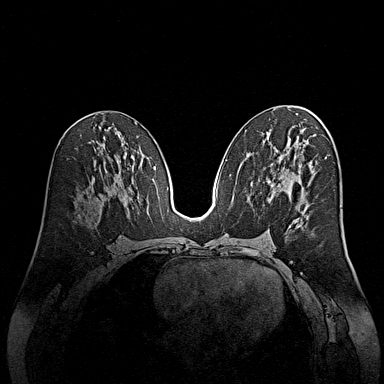
[im 120/160]
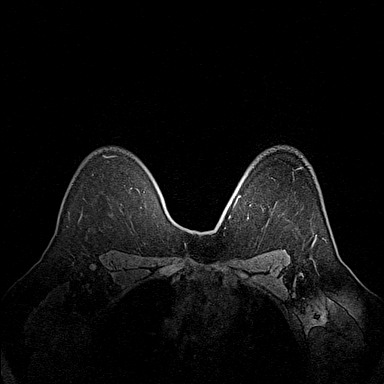
[im 160/160]
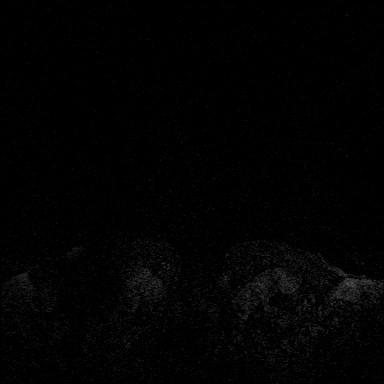

[Series 5: fl3d post-cm 20 · axial · 1.2mm · 1.04mm/px · z∈[-83,+108]mm · 5 of 160 slices shown (1 of 3)]
[im 1/160]
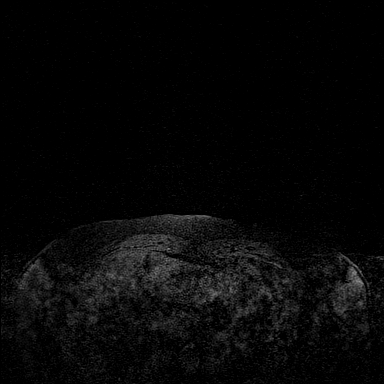
[im 40/160]
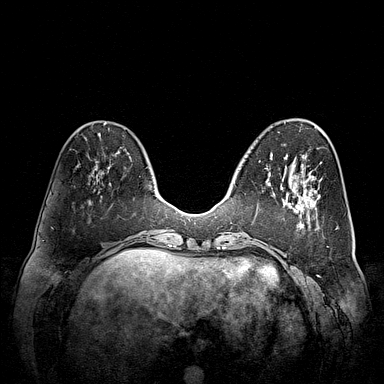
[im 80/160]
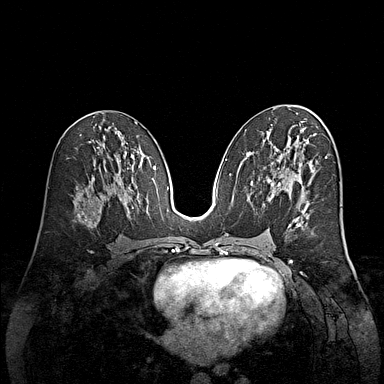
[im 120/160]
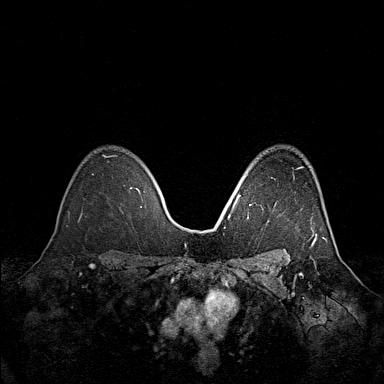
[im 160/160]
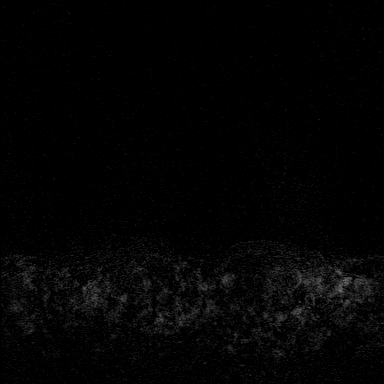

[Series 6: fl3d post-cm 20 · axial · 1.2mm · 1.04mm/px · z∈[-83,+108]mm · 5 of 160 slices shown (2 of 3)]
[im 1/160]
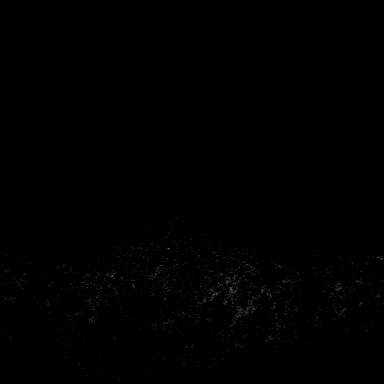
[im 40/160]
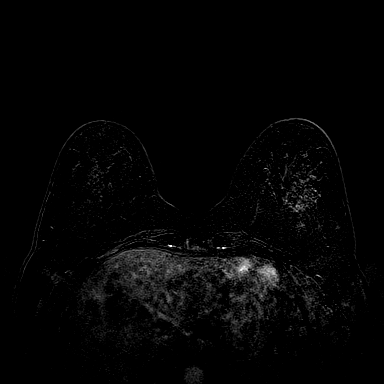
[im 80/160]
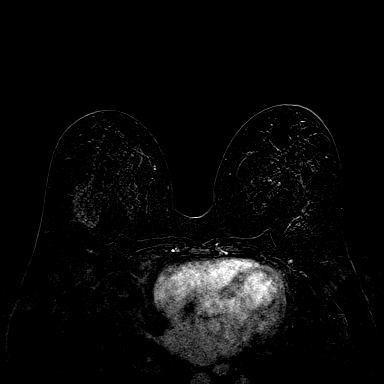
[im 120/160]
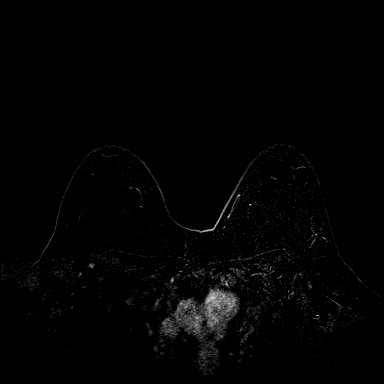
[im 160/160]
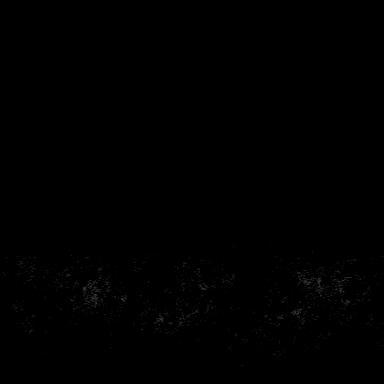

[Series 7: fl3d post-cm 20 · axial · 192.0mm · 1.04mm/px · 1 of 1 slices shown (3 of 3)]
[im 1/1]
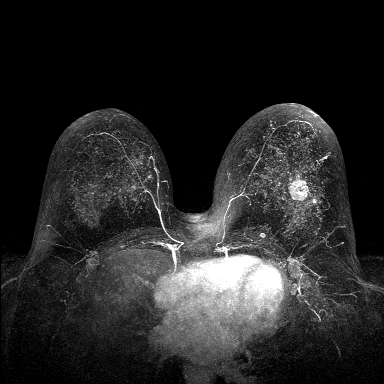

[Series 8: fl3d post-cm 3min · axial · 1.2mm · 1.04mm/px · z∈[-83,+108]mm · 6 of 160 slices shown]
[im 1/160]
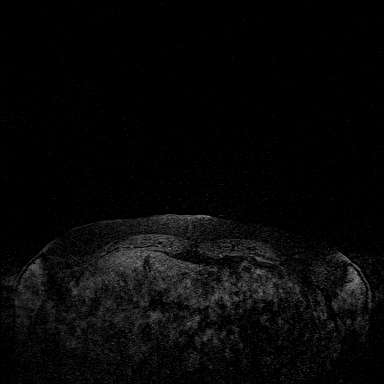
[im 32/160]
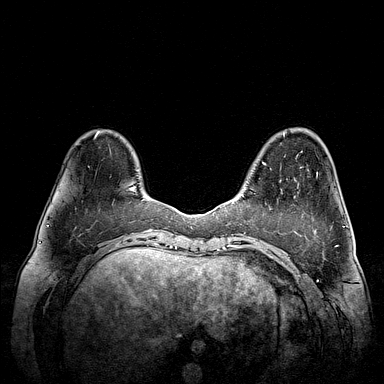
[im 64/160]
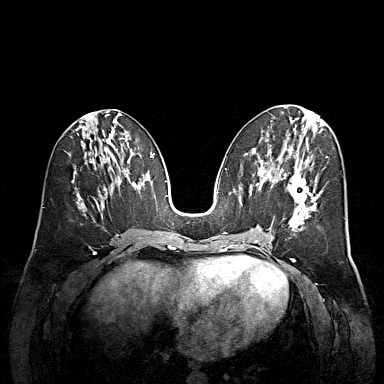
[im 96/160]
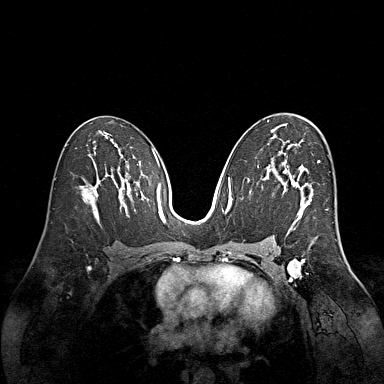
[im 128/160]
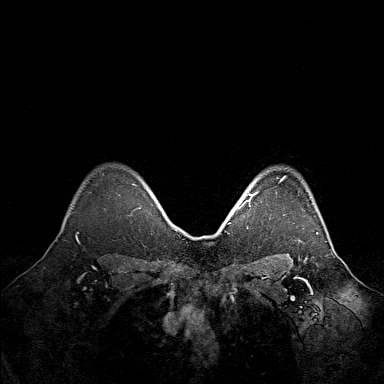
[im 160/160]
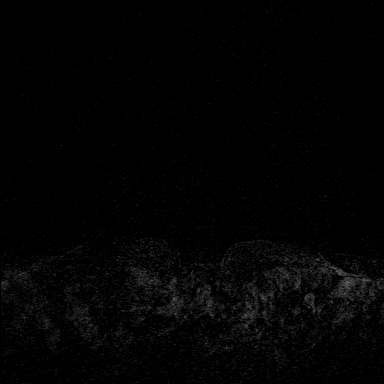

[Series 9: fl3d post-cm 3min_sub · axial · 1.2mm · 1.04mm/px · z∈[-83,-46]mm · 2 of 160 slices shown]
[im 1/160]
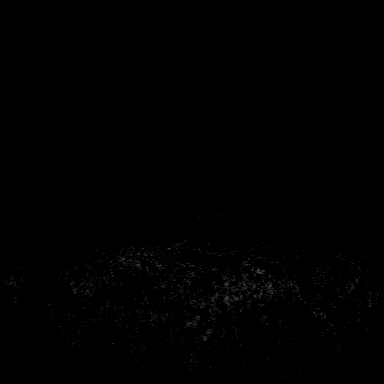
[im 32/160]
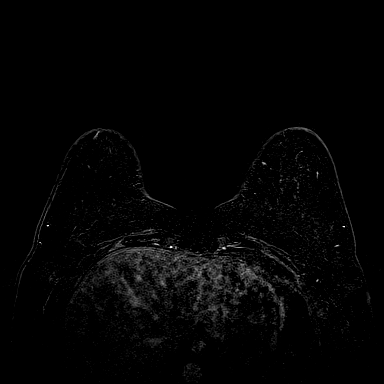

[30 of 48 positions shown; findings below may reference images not displayed]

Three-dimensional MR images were rendered by post-processing of the
original MR data on an independent workstation. The
three-dimensional MR images were interpreted, and findings are
reported in the following complete MRI report for this study. Three
dimensional images were evaluated at the independent interpreting
workstation using the DynaCAD thin client.
FINDINGS: Breast composition: c. Heterogeneous fibroglandular tissue.

Background parenchymal enhancement: Moderate.

Right breast: No suspicious mass or abnormal enhancement.

Left breast: Susceptibility artifact from post biopsy marking clip
is seen in association with an irregular, enhancing mass in the
upper outer quadrant of the left breast at middle depth (series 5,
image 99/160). It measures 2.5 x 2.5 x 2.1 cm and is consistent with
the patient's site of biopsy proven malignancy. Susceptibility
artifact from an additional post biopsy clip is seen just lateral
and inferior to this (series 6, image 102/160). This measures 5 mm
and is consistent with the patient's site of biopsy proven
fibroadenoma.

Additionally, there is a 6 x 6 x 5 mm enhancing mass in the deep
lower inner left breast at far posterior depth, abutting the
pectoralis muscle (series 6, image 111/160). This demonstrates mild
areas of washout with mild T2 hyperintensity.

Otherwise, no suspicious mass or abnormal enhancement in the
remainder of the breast.

Lymph nodes: At least 4 morphologically abnormal level 1 left
axillary lymph nodes are noted, 1 of which contains a post biopsy
marking clip. No suspicious level 2 or level 3 left axillary
lymphadenopathy. No suspicious right axillary lymphadenopathy. No
suspicious intramammary lymph nodes.

Ancillary findings:  None.
IMPRESSION: 1. 2.5 cm mass in the upper outer left breast consistent with the
patient's biopsy-proven malignancy.
2. Additional 5 mm mass in the upper outer left breast consistent
with biopsy proven fibroadenoma.
3. Indeterminate 6 mm mass in the deep lower inner left breast
abutting the pectoralis muscle (series 6, image 111/160).
Unfortunately, this would not be amenable to MRI guided biopsy due
to its location at the pectoralis muscle. Second-look ultrasound is
recommended for further evaluation.
4. At least 4 morphologically abnormal level 1 left axillary lymph
nodes, 1 of which has been previously biopsied.
5. No MRI evidence of malignancy on the right. No suspicious right
axillary or internal mammary chain lymphadenopathy.

RECOMMENDATION:
Second-look ultrasound of the deep lower inner quadrant of the left
breast for a 6 mm mass abutting the pectoralis muscle (series 6,
image 111/160). Ultrasound-guided biopsy should be performed if this
area can be found and safely targeted. Unfortunately, this would not
be amenable to MRI guided biopsy due to its location along the
pectoralis muscle/chest wall.

BI-RADS CATEGORY  4: Suspicious.

## 2020-09-04 MED ORDER — GADOBUTROL 1 MMOL/ML IV SOLN
10.0000 mL | Freq: Once | INTRAVENOUS | Status: AC | PRN
  Administered 2020-09-04: 10 mL via INTRAVENOUS

## 2020-09-04 NOTE — Progress Notes (Signed)
  Echocardiogram 2D Echocardiogram has been performed.  Jennette Dubin 09/04/2020, 11:55 AM

## 2020-09-04 NOTE — Telephone Encounter (Signed)
Revealed negative genetic testing.  Revealed that a VUS in [facility] was identified. This normal result is reassuring and indicates that it is unlikely Ms. Gaultney's cancer is due to a hereditary cause.  It is unlikely that there is an increased risk of another cancer due to a mutation in one of these genes.  However, genetic testing is not perfect, and cannot definitively rule out a hereditary cause.  It will be important for her to keep in contact with genetics to learn if any additional testing may be needed in the future.

## 2020-09-04 NOTE — Progress Notes (Signed)
HPI:  Tracy Dyer was previously seen in the [place] clinic due to a personal and family history of cancer and concerns regarding a hereditary predisposition to cancer. Please refer to our prior cancer genetics clinic note for more information regarding our discussion, assessment and recommendations, at the time. Tracy Dyer recent genetic test results were disclosed to her, as were recommendations warranted by these results. These results and recommendations are discussed in more detail below.  CANCER HISTORY:  Oncology History  Malignant neoplasm of upper-outer quadrant of left breast in female, estrogen receptor positive ([place])  08/21/2020 Initial Diagnosis   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 No pathogenic variants identified on the [facility] Breast Cancer STAT Panel + Multi-Cancer Panel. VUS in [facility] called c.983C>T identified. The report date is 09/04/2020.   The STAT Breast cancer panel offered by [facility] includes sequencing and rearrangement analysis for the following 9 genes:  ATM, BRCA1, BRCA2, CDH1, CHEK2, PALB2, PTEN, STK11 and TP53.    The Multi-Cancer Panel offered by [facility] includes sequencing and/or deletion duplication testing of the following 85 genes: AIP, ALK, APC, ATM, AXIN2,BAP1,  BARD1, BLM, BMPR1A, BRCA1, BRCA2, BRIP1, CASR, CDC73, CDH1, CDK4, CDKN1B, CDKN1C, CDKN2A (p14ARF),  CDKN2A (p16INK4a), CEBPA, CHEK2, CTNNA1, DICER1, DIS3L2, EGFR (c.2369C>T, p.Thr790Met variant only), EPCAM (Deletion/duplication testing only), FH, FLCN, GATA2, GPC3, GREM1 (Promoter region deletion/duplication testing only), HOXB13 (c.251G>A, p.Gly84Glu), HRAS, KIT, MAX, MEN1, MET, MITF (c.952G>A, p.Glu318Lys variant only), MLH1, MSH2, MSH3, MSH6, MUTYH, NBN, NF1, NF2, NTHL1, PALB2, PDGFRA, PHOX2B, PMS2, POLD1, POLE, POT1, PRKAR1A, PTCH1, PTEN, RAD50, RAD51C, RAD51D, RB1, RECQL4, RET, RNF43, RUNX1, SDHAF2, SDHA (sequence changes only), SDHB, SDHC, SDHD, SMAD4, SMARCA4, SMARCB1, SMARCE1, STK11, SUFU, TERC, TERT, TMEM127, TP53, TSC1, TSC2, VHL, WRN and WT1.    09/05/2020 -  Chemotherapy    Patient is on Treatment Plan: BREAST ADJUVANT DOSE DENSE AC Q14D / PACLITAXEL Q7D        FAMILY HISTORY:  We obtained a detailed, 4-generation family history.  Significant diagnoses are listed below: Family History  Problem Relation Age of Onset  . Eczema Father   . Allergic rhinitis Brother   . Breast cancer Mother        in 30's  . Cancer Maternal Uncle        unk type  . Lung cancer Maternal Grandfather 73  . Heart Problems Paternal Grandfather   . Melanoma Cousin   . Ovarian cancer Paternal Great-grandmother   . Cancer Paternal Great-grandmother        GYN cancer, possibly ovarian?  . Cancer Maternal Aunt        half aunts/uncles x5- rare cancers   Tracy Dyer has 2 sons, no cancers. She has 1 brother, 1 sister, no cancers.  Tracy Dyer mother had breast cancer in her 34s and is living at 20. Patient had 1 maternal aunt, no cancers. She has 1 maternal half-uncle through her mother's mother, he currently  has cancer, unknown type, and his daughter had melanoma in her 30s-40s. Patient also has 5 maternal half aunt/uncles through her mother's father who all died of rare cancers, some in their 42s-30s, patient will try to confirm the cancer types and update the history. Patient's maternal grandmother  died at 49, grandfather died at 15 of a heart attack.  Tracy Dyer father is living at 56. No history of cancer for his siblings or patient's first cousins. Paternal grandmother died at 49, her mother had ovarian cancer. Paternal grandfather and his brothers all had lung cancer, he died at 47 and his mother also had a GYN cancer, possibly ovarian.   Tracy Dyer is unaware of previous family history of genetic testing for hereditary cancer risks. Patient's ancestors are of English/Irish/Scandinavian descent.There is no reported Ashkenazi Jewish ancestry. There is no known consanguinity.     GENETIC TEST RESULTS: Genetic testing reported out on 09/04/2020 through the [facility] Breast Cancer STAT Panel + Multi- cancer panel found no pathogenic mutations.   The STAT Breast cancer panel offered by [facility] includes sequencing and rearrangement analysis for the following 9 genes:  ATM, BRCA1, BRCA2, CDH1, CHEK2, PALB2, PTEN, STK11 and TP53.    The Multi-Cancer Panel offered by [facility] includes sequencing and/or deletion duplication testing of the following 85 genes: AIP, ALK, APC, ATM, AXIN2,BAP1,  BARD1, BLM, BMPR1A, BRCA1, BRCA2, BRIP1, CASR, CDC73, CDH1, CDK4, CDKN1B, CDKN1C, CDKN2A (p14ARF), CDKN2A (p16INK4a), CEBPA, CHEK2, CTNNA1, DICER1, DIS3L2, EGFR (c.2369C>T, p.Thr790Met variant only), EPCAM (Deletion/duplication testing only), FH, FLCN, GATA2, GPC3, GREM1 (Promoter region deletion/duplication testing only), HOXB13 (c.251G>A, p.Gly84Glu), HRAS, KIT, MAX, MEN1, MET, MITF (c.952G>A, p.Glu318Lys variant only), MLH1, MSH2, MSH3, MSH6, MUTYH, NBN, NF1, NF2, NTHL1, PALB2, PDGFRA, PHOX2B, PMS2, POLD1, POLE, POT1, PRKAR1A, PTCH1, PTEN, RAD50, RAD51C, RAD51D, RB1, RECQL4, RET, RNF43, RUNX1, SDHAF2, SDHA (sequence changes only), SDHB, SDHC, SDHD, SMAD4, SMARCA4, SMARCB1, SMARCE1, STK11, SUFU, TERC, TERT, TMEM127, TP53, TSC1, TSC2, VHL, WRN and WT1.   The test report has been scanned into EPIC and is located  under the Molecular Pathology section of the Results Review tab.  A portion of the result report is included below for reference.     We discussed with Tracy Dyer that because current genetic testing is not perfect, it is possible there may be a gene mutation in one of these genes that current testing cannot detect, but that chance is small.  We also discussed, that there could be another gene that has not yet been discovered, or that we have not yet tested, that is responsible for the cancer diagnoses in the family. It is also possible there is a hereditary cause for the cancer in the family that Tracy Dyer did not inherit and therefore was not identified in her testing.  Therefore, it is important to remain in touch with cancer genetics in the future so that we can continue to offer Tracy Dyer the most up to date genetic testing.   Genetic testing did identify a variant of uncertain significance (VUS) in the [facility] gene called c.983C>T.  At this time, it is unknown if this variant is associated with increased cancer risk or if this is a normal finding, but most variants such as this get reclassified to being inconsequential. It should not be used to make medical management decisions. With time, we suspect the lab will determine the significance of this variant, if any. If we do learn more about it, we will try to contact Tracy Dyer to discuss it further.  However, it is important to stay in touch with [place] periodically and keep the address and phone number up to date.  ADDITIONAL GENETIC TESTING: We discussed with Tracy Dyer that her genetic testing was fairly extensive.  If there are genes identified to increase cancer risk that can be analyzed in the future, we would be happy to discuss and coordinate this testing at that time.    CANCER SCREENING RECOMMENDATIONS: Tracy Dyer test result is considered negative (normal).  This means that we have not identified a hereditary cause for her  personal and  family history of cancer at this time. Most cancers happen by chance and this negative test suggests that her cancer may fall into this category.    While reassuring, this does not definitively rule out a hereditary predisposition to cancer. It is still possible that there could be genetic mutations that are undetectable by current technology. There could be genetic mutations in genes that have not been tested or identified to increase cancer risk.  Therefore, it is recommended she continue to follow the cancer management and screening guidelines provided by her oncology and primary healthcare provider.   An individual's cancer risk and medical management are not determined by genetic test results alone. Overall cancer risk assessment incorporates additional factors, including personal medical history, family history, and any available genetic information that may result in a personalized plan for cancer prevention and surveillance.  RECOMMENDATIONS FOR FAMILY MEMBERS:  Relatives in this family might be at some increased risk of developing cancer, over the general population risk, simply due to the family history of cancer.  We recommended female relatives in this family have a yearly mammogram beginning at age 9, or 16 years younger than the earliest onset of cancer, an annual clinical breast exam, and perform monthly breast self-exams. Female relatives in this family should also have a gynecological exam as recommended by their primary provider.  All family members should be referred for colonoscopy starting at age 26.    It is also possible there is a hereditary cause for the cancer in Tracy Dyer's family that she did not inherit and therefore was not identified in her.  Based on Tracy Dyer's family history, we recommended paternal relatives (those related to the individual with ovarian cancer) have genetic counseling and testing. Tracy Dyer will let us know if we can be of any assistance in coordinating  genetic counseling and/or testing for these family members.  FOLLOW-UP: Lastly, we discussed with Tracy Dyer that cancer genetics is a rapidly advancing field and it is possible that new genetic tests will be appropriate for her and/or her family members in the future. We encouraged her to remain in contact with cancer genetics on an annual basis so we can update her personal and family histories and let her know of advances in cancer genetics that may benefit this family.   Our contact number was provided. Tracy Dyer questions were answered to her satisfaction, and she knows she is welcome to call us at anytime with additional questions or concerns.   Faith Rogue, MS, [facility] Genetic Counselor [email or web address] Phone: [phone]

## 2020-09-04 NOTE — Progress Notes (Signed)
Met with patient at registration to introduce myself as Arboriculturist and to offer available resources.  Discussed one-time $1000 Radio broadcast assistant to assist with personal expenses while going through treatment. Provided income guidelines.  Also, discussed possible available copay assistance for specific treatment drugs after insurance pays if she has not met her ded/OOP.  Gave her my card if interested in applying and for any additional financial questions or concerns.

## 2020-09-05 ENCOUNTER — Ambulatory Visit (HOSPITAL_COMMUNITY): Payer: BC Managed Care – PPO

## 2020-09-05 ENCOUNTER — Encounter (HOSPITAL_COMMUNITY)
Admission: RE | Disposition: A | Discharge status: Home / Self Care | Payer: Self-pay | Source: Home / Self Care | Attending: General Surgery

## 2020-09-05 ENCOUNTER — Telehealth: Payer: Self-pay | Admitting: *Deleted

## 2020-09-05 ENCOUNTER — Ambulatory Visit (HOSPITAL_COMMUNITY): Payer: BC Managed Care – PPO | Admitting: Anesthesiology

## 2020-09-05 ENCOUNTER — Encounter (HOSPITAL_COMMUNITY): Payer: Self-pay | Admitting: General Surgery

## 2020-09-05 ENCOUNTER — Other Ambulatory Visit (HOSPITAL_COMMUNITY): Payer: BC Managed Care – PPO

## 2020-09-05 ENCOUNTER — Other Ambulatory Visit: Payer: Self-pay | Admitting: *Deleted

## 2020-09-05 ENCOUNTER — Ambulatory Visit (HOSPITAL_BASED_OUTPATIENT_CLINIC_OR_DEPARTMENT_OTHER)
Admission: RE | Admit: 2020-09-05 | Discharge: 2020-09-05 | Disposition: A | Discharge status: Home / Self Care | Payer: BC Managed Care – PPO | Attending: General Surgery | Admitting: General Surgery

## 2020-09-05 DIAGNOSIS — Z803 Family history of malignant neoplasm of breast: Secondary | ICD-10-CM | POA: Diagnosis not present

## 2020-09-05 DIAGNOSIS — C773 Secondary and unspecified malignant neoplasm of axilla and upper limb lymph nodes: Secondary | ICD-10-CM | POA: Insufficient documentation

## 2020-09-05 DIAGNOSIS — Z170 Estrogen receptor positive status [ER+]: Secondary | ICD-10-CM

## 2020-09-05 DIAGNOSIS — C50412 Malignant neoplasm of upper-outer quadrant of left female breast: Secondary | ICD-10-CM

## 2020-09-05 DIAGNOSIS — Z20822 Contact with and (suspected) exposure to covid-19: Secondary | ICD-10-CM | POA: Diagnosis not present

## 2020-09-05 DIAGNOSIS — C50912 Malignant neoplasm of unspecified site of left female breast: Secondary | ICD-10-CM | POA: Insufficient documentation

## 2020-09-05 DIAGNOSIS — F419 Anxiety disorder, unspecified: Secondary | ICD-10-CM | POA: Diagnosis not present

## 2020-09-05 DIAGNOSIS — Z419 Encounter for procedure for purposes other than remedying health state, unspecified: Secondary | ICD-10-CM

## 2020-09-05 DIAGNOSIS — Z882 Allergy status to sulfonamides status: Secondary | ICD-10-CM | POA: Insufficient documentation

## 2020-09-05 DIAGNOSIS — Z452 Encounter for adjustment and management of vascular access device: Secondary | ICD-10-CM | POA: Diagnosis not present

## 2020-09-05 DIAGNOSIS — R928 Other abnormal and inconclusive findings on diagnostic imaging of breast: Secondary | ICD-10-CM

## 2020-09-05 HISTORY — DX: Malignant (primary) neoplasm, unspecified: C80.1

## 2020-09-05 HISTORY — DX: Anxiety disorder, unspecified: F41.9

## 2020-09-05 HISTORY — DX: Other seasonal allergic rhinitis: J30.2

## 2020-09-05 HISTORY — PX: PORTACATH PLACEMENT: SHX2246

## 2020-09-05 HISTORY — DX: Gastro-esophageal reflux disease without esophagitis: K21.9

## 2020-09-05 LAB — POCT PREGNANCY, URINE: Preg Test, Ur: NEGATIVE

## 2020-09-05 IMAGING — RF DG CHEST 1V
1 series · 1 of 1 positions shown · non-contrast
Comparison: [DATE]

CLINICAL DATA: Port-a-cath placement Please eval for ptx per Dr
LECOMBAT

EXAM:
CHEST  1 VIEW; DG C-ARM 1-60 MIN

[Series 1: run · 1 of 1 slices shown]
[im 1/1]
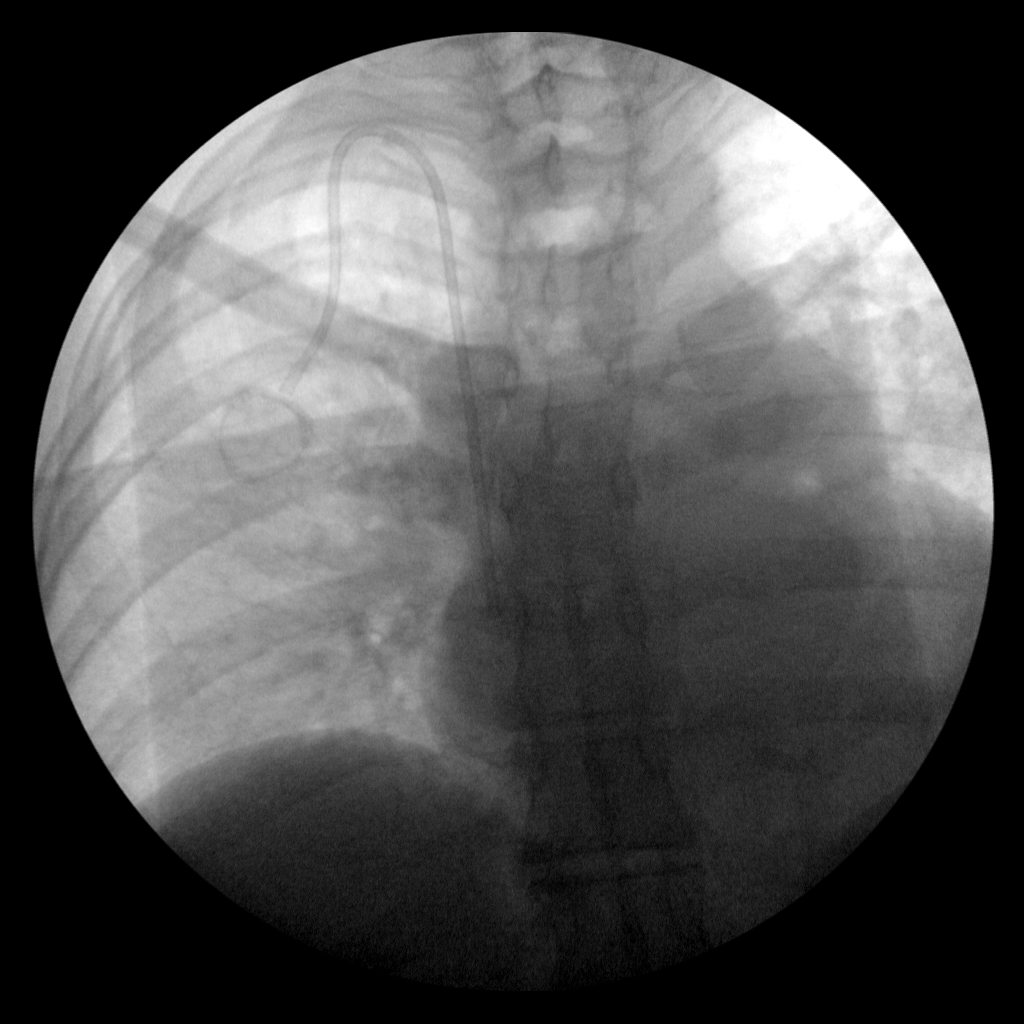

[1 of 1 positions shown; findings below may reference images not displayed]

FINDINGS: Interval placement of right IJ power injectable port catheter, tip
near the cavoatrial junction. No evident pneumothorax although the
right lateral costophrenic angle is excluded.
IMPRESSION: Port placement without evident  pneumothorax.

## 2020-09-05 SURGERY — INSERTION, TUNNELED CENTRAL VENOUS DEVICE, WITH PORT
Anesthesia: General | Site: Chest

## 2020-09-05 MED ORDER — ONDANSETRON HCL 4 MG/2ML IJ SOLN: 4.0000 mg | Freq: Once | INTRAMUSCULAR | Status: DC | PRN

## 2020-09-05 MED ORDER — AMISULPRIDE (ANTIEMETIC) 5 MG/2ML IV SOLN: 10.0000 mg | Freq: Once | INTRAVENOUS | Status: DC | PRN

## 2020-09-05 MED ORDER — TRAMADOL HCL 50 MG PO TABS: 50.0000 mg | ORAL_TABLET | Freq: Four times a day (QID) | ORAL | 0 refills | Status: DC | PRN

## 2020-09-05 MED ORDER — SODIUM CHLORIDE 0.9 % IV SOLN: INTRAVENOUS | Status: DC

## 2020-09-05 MED ORDER — PROPOFOL 10 MG/ML IV BOLUS
INTRAVENOUS | Status: AC
  Filled 2020-09-05: qty 40

## 2020-09-05 MED ORDER — ACETAMINOPHEN 325 MG PO TABS: 650.0000 mg | ORAL_TABLET | ORAL | Status: DC | PRN

## 2020-09-05 MED ORDER — FENTANYL CITRATE (PF) 100 MCG/2ML IJ SOLN: 25.0000 ug | INTRAMUSCULAR | Status: DC | PRN

## 2020-09-05 MED ORDER — SODIUM CHLORIDE 0.9 % IV SOLN
INTRAVENOUS | Status: DC | PRN
  Administered 2020-09-05: 100 mL

## 2020-09-05 MED ORDER — ACETAMINOPHEN 650 MG RE SUPP
650.0000 mg | RECTAL | Status: DC | PRN
  Filled 2020-09-05: qty 1

## 2020-09-05 MED ORDER — ENSURE PRE-SURGERY PO LIQD: 296.0000 mL | Freq: Once | ORAL | Status: DC

## 2020-09-05 MED ORDER — MIDAZOLAM HCL 2 MG/2ML IJ SOLN
INTRAMUSCULAR | Status: AC
  Filled 2020-09-05: qty 2

## 2020-09-05 MED ORDER — SODIUM CHLORIDE 0.9 % IV SOLN: 250.0000 mL | INTRAVENOUS | Status: DC | PRN

## 2020-09-05 MED ORDER — FENTANYL CITRATE (PF) 100 MCG/2ML IJ SOLN
INTRAMUSCULAR | Status: DC | PRN
  Administered 2020-09-05 (×3): 50 ug via INTRAVENOUS

## 2020-09-05 MED ORDER — PHENYLEPHRINE 40 MCG/ML (10ML) SYRINGE FOR IV PUSH (FOR BLOOD PRESSURE SUPPORT)
PREFILLED_SYRINGE | INTRAVENOUS | Status: DC | PRN
  Administered 2020-09-05: 40 ug via INTRAVENOUS

## 2020-09-05 MED ORDER — ACETAMINOPHEN 500 MG PO TABS
1000.0000 mg | ORAL_TABLET | ORAL | Status: AC
  Administered 2020-09-05: 1000 mg via ORAL
  Filled 2020-09-05: qty 2

## 2020-09-05 MED ORDER — OXYCODONE HCL 5 MG/5ML PO SOLN: 5.0000 mg | Freq: Once | ORAL | Status: DC | PRN

## 2020-09-05 MED ORDER — OXYCODONE HCL 5 MG PO TABS: 5.0000 mg | ORAL_TABLET | Freq: Once | ORAL | Status: DC | PRN

## 2020-09-05 MED ORDER — LACTATED RINGERS IV SOLN: INTRAVENOUS | Status: DC | PRN

## 2020-09-05 MED ORDER — MIDAZOLAM HCL 5 MG/5ML IJ SOLN
INTRAMUSCULAR | Status: DC | PRN
  Administered 2020-09-05: 2 mg via INTRAVENOUS

## 2020-09-05 MED ORDER — HEPARIN SOD (PORK) LOCK FLUSH 100 UNIT/ML IV SOLN
INTRAVENOUS | Status: DC | PRN
  Administered 2020-09-05: 400 [IU]

## 2020-09-05 MED ORDER — CHLORHEXIDINE GLUCONATE 0.12 % MT SOLN
OROMUCOSAL | Status: AC
  Administered 2020-09-05: 15 mL
  Filled 2020-09-05: qty 15

## 2020-09-05 MED ORDER — BUPIVACAINE HCL 0.25 % IJ SOLN
INTRAMUSCULAR | Status: DC | PRN
  Administered 2020-09-05: 10 mL

## 2020-09-05 MED ORDER — HEPARIN SOD (PORK) LOCK FLUSH 100 UNIT/ML IV SOLN
INTRAVENOUS | Status: AC
  Filled 2020-09-05: qty 5

## 2020-09-05 MED ORDER — LIDOCAINE 2% (20 MG/ML) 5 ML SYRINGE
INTRAMUSCULAR | Status: AC
  Filled 2020-09-05: qty 5

## 2020-09-05 MED ORDER — 0.9 % SODIUM CHLORIDE (POUR BTL) OPTIME
TOPICAL | Status: DC | PRN
  Administered 2020-09-05: 1000 mL

## 2020-09-05 MED ORDER — OXYCODONE HCL 5 MG PO TABS: 5.0000 mg | ORAL_TABLET | ORAL | Status: DC | PRN

## 2020-09-05 MED ORDER — TRAMADOL HCL 50 MG PO TABS
ORAL_TABLET | ORAL | Status: AC
  Filled 2020-09-05: qty 1

## 2020-09-05 MED ORDER — KETOROLAC TROMETHAMINE 15 MG/ML IJ SOLN
15.0000 mg | INTRAMUSCULAR | Status: AC
  Administered 2020-09-05: 15 mg via INTRAVENOUS
  Filled 2020-09-05: qty 1

## 2020-09-05 MED ORDER — SODIUM CHLORIDE 0.9% FLUSH: 3.0000 mL | INTRAVENOUS | Status: DC | PRN

## 2020-09-05 MED ORDER — SODIUM CHLORIDE 0.9% FLUSH: 3.0000 mL | Freq: Two times a day (BID) | INTRAVENOUS | Status: DC

## 2020-09-05 MED ORDER — TRAMADOL HCL 50 MG PO TABS
50.0000 mg | ORAL_TABLET | Freq: Once | ORAL | Status: AC
Start: 2020-09-05 — End: 2020-09-05
  Administered 2020-09-05: 50 mg via ORAL

## 2020-09-05 MED ORDER — DEXAMETHASONE SODIUM PHOSPHATE 10 MG/ML IJ SOLN
INTRAMUSCULAR | Status: AC
  Filled 2020-09-05: qty 1

## 2020-09-05 MED ORDER — FENTANYL CITRATE (PF) 250 MCG/5ML IJ SOLN
INTRAMUSCULAR | Status: AC
  Filled 2020-09-05: qty 5

## 2020-09-05 MED ORDER — BUPIVACAINE HCL (PF) 0.25 % IJ SOLN
INTRAMUSCULAR | Status: AC
  Filled 2020-09-05: qty 30

## 2020-09-05 MED ORDER — SODIUM CHLORIDE 0.9 % IV SOLN
INTRAVENOUS | Status: AC
  Filled 2020-09-05: qty 1.2

## 2020-09-05 MED ORDER — ONDANSETRON HCL 4 MG/2ML IJ SOLN
INTRAMUSCULAR | Status: AC
  Filled 2020-09-05: qty 2

## 2020-09-05 MED ORDER — LIDOCAINE 2% (20 MG/ML) 5 ML SYRINGE
INTRAMUSCULAR | Status: DC | PRN
  Administered 2020-09-05: 60 mg via INTRAVENOUS

## 2020-09-05 MED ORDER — PROPOFOL 10 MG/ML IV BOLUS
INTRAVENOUS | Status: DC | PRN
  Administered 2020-09-05: 200 mg via INTRAVENOUS

## 2020-09-05 MED ORDER — ONDANSETRON HCL 4 MG/2ML IJ SOLN
INTRAMUSCULAR | Status: DC | PRN
  Administered 2020-09-05: 4 mg via INTRAVENOUS

## 2020-09-05 MED ORDER — DEXAMETHASONE SODIUM PHOSPHATE 4 MG/ML IJ SOLN
INTRAMUSCULAR | Status: DC | PRN
  Administered 2020-09-05: 10 mg via INTRAVENOUS

## 2020-09-05 MED ORDER — CEFAZOLIN SODIUM-DEXTROSE 2-4 GM/100ML-% IV SOLN
2.0000 g | INTRAVENOUS | Status: AC
  Administered 2020-09-05: 2 g via INTRAVENOUS
  Filled 2020-09-05: qty 100

## 2020-09-05 MED ORDER — PROPOFOL 10 MG/ML IV BOLUS: INTRAVENOUS | Status: DC | PRN

## 2020-09-05 SURGICAL SUPPLY — 35 items
ADH SKN CLS APL DERMABOND .7 (GAUZE/BANDAGES/DRESSINGS) ×1
APL PRP STRL LF DISP 70% ISPRP (MISCELLANEOUS) ×1
CHLORAPREP W/TINT 26 (MISCELLANEOUS) ×2 IMPLANT
COVER SURGICAL LIGHT HANDLE (MISCELLANEOUS) ×2 IMPLANT
COVER TRANSDUCER ULTRASND GEL (DISPOSABLE) ×2 IMPLANT
DECANTER SPIKE VIAL GLASS SM (MISCELLANEOUS) ×2 IMPLANT
DERMABOND ADVANCED (GAUZE/BANDAGES/DRESSINGS) ×1
DERMABOND ADVANCED .7 DNX12 (GAUZE/BANDAGES/DRESSINGS) ×1 IMPLANT
DRAPE C-ARM 42X120 X-RAY (DRAPES) ×2 IMPLANT
DRAPE CHEST BREAST 15X10 FENES (DRAPES) ×2 IMPLANT
DRSG TEGADERM 4X4.75 (GAUZE/BANDAGES/DRESSINGS) ×2 IMPLANT
ELECT CAUTERY BLADE 6.4 (BLADE) ×2 IMPLANT
ELECT REM PT RETURN 9FT ADLT (ELECTROSURGICAL) ×2
ELECTRODE REM PT RTRN 9FT ADLT (ELECTROSURGICAL) ×1 IMPLANT
GAUZE 4X4 16PLY RFD (DISPOSABLE) ×2 IMPLANT
GAUZE SPONGE 4X4 12PLY STRL (GAUZE/BANDAGES/DRESSINGS) ×2 IMPLANT
GEL ULTRASOUND 20GR AQUASONIC (MISCELLANEOUS) ×2 IMPLANT
GLOVE BIO SURGEON STRL SZ7 (GLOVE) ×2 IMPLANT
GOWN STRL REUS W/ TWL LRG LVL3 (GOWN DISPOSABLE) ×2 IMPLANT
GOWN STRL REUS W/TWL LRG LVL3 (GOWN DISPOSABLE) ×4
KIT BASIN OR (CUSTOM PROCEDURE TRAY) ×2 IMPLANT
KIT PORT POWER 8FR ISP CVUE (Port) ×2 IMPLANT
KIT TURNOVER KIT B (KITS) ×2 IMPLANT
NS IRRIG 1000ML POUR BTL (IV SOLUTION) ×2 IMPLANT
PAD ARMBOARD 7.5X6 YLW CONV (MISCELLANEOUS) ×4 IMPLANT
PENCIL BUTTON HOLSTER BLD 10FT (ELECTRODE) ×2 IMPLANT
POSITIONER HEAD DONUT 9IN (MISCELLANEOUS) ×2 IMPLANT
STRIP CLOSURE SKIN 1/2X4 (GAUZE/BANDAGES/DRESSINGS) ×2 IMPLANT
SUT MNCRL AB 4-0 PS2 18 (SUTURE) ×2 IMPLANT
SUT PROLENE 2 0 SH DA (SUTURE) ×2 IMPLANT
SUT VIC AB 3-0 SH 27 (SUTURE) ×2
SUT VIC AB 3-0 SH 27XBRD (SUTURE) ×1 IMPLANT
SYR 5ML LUER SLIP (SYRINGE) ×2 IMPLANT
TOWEL GREEN STERILE FF (TOWEL DISPOSABLE) ×2 IMPLANT
TRAY LAPAROSCOPIC MC (CUSTOM PROCEDURE TRAY) ×2 IMPLANT

## 2020-09-05 NOTE — Progress Notes (Signed)
Pharmacist Chemotherapy Monitoring - Initial Assessment    Anticipated start date: 09/06/20   Regimen:  . Are orders appropriate based on the patient's diagnosis, regimen, and cycle? Yes . Does the plan date match the patient's scheduled date? Yes . Is the sequencing of drugs appropriate? Yes . Are the premedications appropriate for the patient's regimen? Yes . Prior Authorization for treatment is: Approved o If applicable, is the correct biosimilar selected based on the patient's insurance? not applicable  Organ Function and Labs: Marland Kitchen Are dose adjustments needed based on the patient's renal function, hepatic function, or hematologic function? No . Are appropriate labs ordered prior to the start of patient's treatment? Yes . Other organ system assessment, if indicated: doxorubicin liposomal: Echo/ MUGA . The following baseline labs, if indicated, have been ordered: N/A  Dose Assessment: . Are the drug doses appropriate? Yes . Are the following correct: o Drug concentrations Yes o IV fluid compatible with drug Yes o Administration routes Yes o Timing of therapy Yes . If applicable, does the patient have documented access for treatment and/or plans for port-a-cath placement? yes . If applicable, have lifetime cumulative doses been properly documented and assessed? yes Lifetime Dose Tracking  No doses have been documented on this patient for the following tracked chemicals: Doxorubicin, Epirubicin, Idarubicin, Daunorubicin, Mitoxantrone, Bleomycin, Oxaliplatin, Carboplatin, Liposomal Doxorubicin  o   Toxicity Monitoring/Prevention: . The patient has the following take home antiemetics prescribed: Dexamethasone and Lorazepam . The patient has the following take home medications prescribed: N/A . Medication allergies and previous infusion related reactions, if applicable, have been reviewed and addressed. Yes . The patient's current medication list has been assessed for drug-drug  interactions with their chemotherapy regimen. no significant drug-drug interactions were identified on review.  Order Review: . Are the treatment plan orders signed? Yes . Is the patient scheduled to see a provider prior to their treatment? No  I verify that I have reviewed each item in the above checklist and answered each question accordingly.  @RPHNAME @

## 2020-09-05 NOTE — OR Nursing (Signed)
Dr. Donne Hazel left port accessed.

## 2020-09-05 NOTE — Anesthesia Preprocedure Evaluation (Addendum)
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 The regurgitant jet is directed anteriorly. The mitral valve is myxomatous. Mild mitral valve regurgitation. No evidence of  mitral stenosis. There is moderate late systolic prolapse of the middle scallop of the posterior leaflet of the mitral valve. 2. Left ventricular ejection fraction, by estimation, is 60 to 65%. The left ventricle has normal function. The left ventricle has no regional wall motion abnormalities. Left ventricular diastolic parameters were normal. 3. Right ventricular systolic function is normal. The right ventricular size is normal. Tricuspid regurgitation signal is inadequate for assessing PA pressure. 4. The aortic valve is tricuspid. Aortic valve regurgitation is not visualized. No aortic stenosis is present. 5. The inferior vena cava is normal in size with greater than 50% respiratory variability, suggesting right atrial pressure of 3 mmHg.   Reproductive/Obstetrics                            Anesthesia Physical Anesthesia Plan  ASA: II  Anesthesia Plan: General   Post-op Pain Management:    Induction: Intravenous  PONV Risk Score and Plan: 3 and Ondansetron, Dexamethasone, Midazolam and Treatment may vary due to age or medical condition  Airway Management Planned: LMA  Additional Equipment: None  Intra-op Plan:   Post-operative Plan: Extubation in OR  Informed Consent: I have reviewed the patients History and Physical, chart, labs and discussed the procedure including the risks, benefits and alternatives for the proposed anesthesia with the patient or authorized representative who has indicated his/her understanding and acceptance.     Dental advisory given  Plan Discussed with:   Anesthesia Plan Comments:         Anesthesia Quick Evaluation

## 2020-09-05 NOTE — Anesthesia Postprocedure Evaluation (Signed)
Anesthesia Post Note  Patient: Tracy Dyer  Procedure(s) Performed: INSERTION PORT-A-CATH (N/A Chest)     Patient location during evaluation: PACU Anesthesia Type: General Level of consciousness: awake and alert Pain management: pain level controlled Vital Signs Assessment: post-procedure vital signs reviewed and stable Respiratory status: spontaneous breathing, nonlabored ventilation and respiratory function stable Cardiovascular status: blood pressure returned to baseline and stable Postop Assessment: no apparent nausea or vomiting Anesthetic complications: no   No complications documented.  Last Vitals:  Vitals:   09/05/20 0933 09/05/20 0945  BP: 111/62 114/65  Pulse: (!) 51 (!) 50  Resp: 13 13  Temp:  36.6 C  SpO2: 96% 96%    Last Pain:  Vitals:   09/05/20 0900  TempSrc:   PainSc: 0-No pain                 Lidia Collum

## 2020-09-05 NOTE — Telephone Encounter (Signed)
Pt left VM stating concern due to " I read my results on line and I do not know what they mean but it seems like things are worse "  Note pt was tearful during VM.  This RN obtained MRI reading as well as reviewed MD note and prior mammo and u/s per new diagnosis with plan to start neoadjuvant chemo.  This RN returned call to pt reviewed her concerns with " they say 2 more lymph nodes - is that because my cancer is growing so fast or was it just missed on the U/S?"  " they also see something next to my muscle in the back of my breast "  This RN validated her concern and how MRis are more detailed - that it is not uncommon for it to see more areas of concern then previously viewed- as well as often it is best practice to biopsy other areas for overall understanding of diagnosis.  This RN reassured her current plan is still appropriate with goal of using chemo to shrink the tumors for best surgical outcome.  This RN also apologized for report causing her anxiety - she stated appreciation- and understanding " I guess it's a two edged sword- not knowing is awful but then when you read it and don't understand it, it gets scary"  Per end of conversation pt stated a sense of " feeling much better and you guys are wonderful "  This RN informed her her report will be given to MD ( who may have already seen ) and a call to her will be given on Monday upon his return for her reassurance.  No further needs at this time.

## 2020-09-05 NOTE — Transfer of Care (Signed)
Immediate Anesthesia Transfer of Care Note  Patient: Tracy Dyer  Procedure(s) Performed: INSERTION PORT-A-CATH (N/A Chest)  Patient Location: PACU  Anesthesia Type:General  Level of Consciousness: drowsy  Airway & Oxygen Therapy: Patient Spontanous Breathing and Patient connected to face mask oxygen  Post-op Assessment: Report given to RN and Post -op Vital signs reviewed and stable  Post vital signs: Reviewed and stable  Last Vitals:  Vitals Value Taken Time  BP 102/52 09/05/20 0833  Temp    Pulse 50 09/05/20 0834  Resp 10 09/05/20 0834  SpO2 96 % 09/05/20 0834  Vitals shown include unvalidated device data.  Last Pain:  Vitals:   09/05/20 0650  TempSrc:   PainSc: 0-No pain      Patients Stated Pain Goal: 3 (16/10/96 0454)  Complications: No complications documented.

## 2020-09-05 NOTE — Anesthesia Procedure Notes (Signed)
Procedure Name: LMA Insertion Date/Time: 09/05/2020 7:48 AM Performed by: Lieutenant Diego, CRNA Pre-anesthesia Checklist: Patient identified, Emergency Drugs available, Suction available and Patient being monitored Patient Re-evaluated:Patient Re-evaluated prior to induction Oxygen Delivery Method: Circle system utilized Preoxygenation: Pre-oxygenation with 100% oxygen Induction Type: IV induction Ventilation: Mask ventilation without difficulty LMA: LMA inserted LMA Size: 4.0 Number of attempts: 1 Airway Equipment and Method: Bite block Placement Confirmation: positive ETCO2 and breath sounds checked- equal and bilateral Tube secured with: Tape Dental Injury: Teeth and Oropharynx as per pre-operative assessment

## 2020-09-05 NOTE — Interval H&P Note (Signed)
History and Physical Interval Note:  09/05/2020 7:21 AM  Tracy Dyer  has presented today for surgery, with the diagnosis of LEFT BREAST CANCER.  The various methods of treatment have been discussed with the patient and family. After consideration of risks, benefits and other options for treatment, the patient has consented to  Procedure(s) with comments: INSERTION PORT-A-CATH (N/A) - PEC BLOCK as a surgical intervention.  The patient's history has been reviewed, patient examined, no change in status, stable for surgery.  I have reviewed the patient's chart and labs.  Questions were answered to the patient's satisfaction.     [name]

## 2020-09-05 NOTE — Discharge Instructions (Signed)
    PORT-A-CATH: POST OP INSTRUCTIONS  Always review your discharge instruction sheet given to you by the facility where your surgery was performed.   1. A prescription for pain medication may be given to you upon discharge. Take your pain medication as prescribed, if needed. If narcotic pain medicine is not needed, then you make take acetaminophen (Tylenol) or ibuprofen (Advil) as needed.  2. Take your usually prescribed medications unless otherwise directed. 3. If you need a refill on your pain medication, please contact our office. All narcotic pain medicine now requires a paper prescription.  Phoned in and fax refills are no longer allowed by law.  Prescriptions will not be filled after 5 pm or on weekends.  4. You should follow a light diet for the remainder of the day after your procedure. 5. Most patients will experience some mild swelling and/or bruising in the area of the incision. It may take several days to resolve. 6. It is common to experience some constipation if taking pain medication after surgery. Increasing fluid intake and taking a stool softener (such as Colace) will usually help or prevent this problem from occurring. A mild laxative (Milk of Magnesia or Miralax) should be taken according to package directions if there are no bowel movements after 48 hours.  7. Unless discharge instructions indicate otherwise, you may remove your bandages 48 hours after surgery, and you may shower at that time. You may have steri-strips (small white skin tapes) in place directly over the incision.  These strips should be left on the skin for 7-10 days.  If your surgeon used Dermabond (skin glue) on the incision, you may shower in 24 hours.  The glue will flake off over the next 2-3 weeks.  8. If your port is left accessed at the end of surgery (needle left in port), the dressing cannot get wet and should only by changed by a healthcare professional. When the port is no longer accessed (when the  needle has been removed), follow step 7.   9. ACTIVITIES:  Limit activity involving your arms for the next 72 hours. Do no strenuous exercise or activity for 1 week. You may drive when you are no longer taking prescription pain medication, you can comfortably wear a seatbelt, and you can maneuver your car. 10.You may need to see your doctor in the office for a follow-up appointment.  Please       check with your doctor.  11.When you receive a new Port-a-Cath, you will get a product guide and        ID card.  Please keep them in case you need them.  WHEN TO CALL YOUR DOCTOR ([phone]): 1. Fever over 101.0 2. Chills 3. Continued bleeding from incision 4. Increased redness and tenderness at the site 5. Shortness of breath, difficulty breathing   The clinic staff is available to answer your questions during regular business hours. Please don't hesitate to call and ask to speak to one of the nurses or medical assistants for clinical concerns. If you have a medical emergency, go to the nearest emergency room or call 911.  A surgeon from [facility] is always on call at the hospital.     For further information, please visit [email or web address]      

## 2020-09-05 NOTE — Op Note (Signed)
Preoperative diagnosis: Clinical stage IIleft breast cancer Postoperative diagnosis: Same as above Procedure: Right internal jugular port placement with ultrasound guidance Surgeon: Dr. Serita Grammes Anesthesia: General  Estimated blood loss:minimal Specimens:none Sponge and needlecount was correct atcompletion Drains: None Disposition recovery stable condition  Indications:  45 yof who is otherwise healthy presents with new left breast cancer. she was getting f/u for calcs. she has prior benign biopsies. she palpated a left breast mass. mm showed irregular mass and c density breasts. US shows a 3 oclock mass measuring 1.8x1.5x1.3 cm and another near that that measures 10x5x5 mm. there are two enlarged axillary nodes present. biopsy of the two breast masses and a node done. clips 2.5 cm apart. 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 biopsy of the second breast lesion if FA with UDH this is concordant. she has been seen by oncology and we have discussed primary systemic therapy with port placement first.   Procedure: After informed consent was obtainedshe was taken to the OR.She was given antibiotics. SCDs were placed. She was placed under general anesthesia without complication. She was prepped and draped in the standard sterile surgical fashion. A surgical timeout was then performed.  I used the ultrasound to identify the right internal jugular vein. Under ultrasound guidance I then accessed the vein with the needle. I passed the wire. The wire was in the vein both by ultrasound and by fluoroscopy. I thenmade an incisionon her right chest from her prior made a pocket.I tunneled the line between the 2 sites. I then placed the dilator over the wire. I observed this with fluoroscopy to go in the correct position. I then removedthe wire. I then passed the line. The peel-away sheath  was removed. I pulled the line back to be in the superior vena cava. I then attached the port. I sutured this into place with 2-0 Prolene. I then closed this with 3-0 Vicryl and 4-0 Monocryl. Glue was placed. Final fluoroscopic image showed the port to be in good position. I then accessed the port and was able to aspirate blood and packed this with heparin.I left this accessed for chemotherapy

## 2020-09-05 NOTE — H&P (Signed)
50 yof who is otherwise healthy presents with new left breast cancer. she was getting f/u for calcs. she has prior benign biopsies. she palpated a left breast mass. mm showed irregular mass and c density breasts. US shows a 3 oclock mass measuring 1.8x1.5x1.3 cm and another near that that measures 10x5x5 mm. there are two enlarged axillary nodes present. biopsy of the two breast masses and a node done. clips 2.5 cm apart. 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 biopsy of the second breast lesion if FA with UDH this is concordant. she is here with her husband to discuss options   Past Surgical History Illene Regulus, CMA; 08/21/2020 10:33 AM) Gallbladder Surgery - Laparoscopic  Oral Surgery   Diagnostic Studies History Lars Mage Spillers, CMA; 08/21/2020 10:33 AM) Colonoscopy  1-5 years ago Mammogram  within last year Pap Smear  1-5 years ago  Allergies Illene Regulus, CMA; 08/21/2020 10:34 AM) Sulfa Drugs   Medication History (Alisha Spillers, CMA; 08/21/2020 10:34 AM) clonazePAM (1MG  Tablet, Oral) Active. Sertraline HCl (100MG  Tablet, Oral) Active. Medications Reconciled  Social History Illene Regulus, CMA; 08/21/2020 10:33 AM) Alcohol use  Occasional alcohol use. Caffeine use  Coffee. No drug use  Tobacco use  Never smoker.  Family History Illene Regulus, [place]; 08/21/2020 10:33 AM) Breast Cancer  Mother. Heart disease in female family member before age 40  Thyroid problems  Sister.  Pregnancy / Birth History Illene Regulus, CMA; 08/21/2020 10:33 AM) Age at menarche  26 years. Gravida  2 Irregular periods  Length (months) of breastfeeding  >24 Maternal age  31-30 Para  2  Other Problems Illene Regulus, CMA; 08/21/2020 10:33 AM) Anxiety Disorder  Gastroesophageal Reflux Disease  Migraine Headache    Review of Systems (Alisha Spillers CMA; 08/21/2020 10:33  AM) General Present- Fatigue. Not Present- Appetite Loss, Chills, Fever, Night Sweats, Weight Gain and Weight Loss. Skin Not Present- Change in Wart/Mole, Dryness, Hives, Jaundice, New Lesions, Non-Healing Wounds, Rash and Ulcer. HEENT Present- Wears glasses/contact lenses. Not Present- Earache, Hearing Loss, Hoarseness, Nose Bleed, Oral Ulcers, Ringing in the Ears, Seasonal Allergies, Sinus Pain, Sore Throat, Visual Disturbances and Yellow Eyes. Breast Present- Breast Mass, Breast Pain and Skin Changes. Not Present- Nipple Discharge. Cardiovascular Not Present- Chest Pain, Difficulty Breathing Lying Down, Leg Cramps, Palpitations, Rapid Heart Rate, Shortness of Breath and Swelling of Extremities. Gastrointestinal Present- Indigestion. Not Present- Abdominal Pain, Bloating, Bloody Stool, Change in Bowel Habits, Chronic diarrhea, Constipation, Difficulty Swallowing, Excessive gas, Gets full quickly at meals, Hemorrhoids, Nausea, Rectal Pain and Vomiting. Female Genitourinary Not Present- Frequency, Nocturia, Painful Urination, Pelvic Pain and Urgency. Musculoskeletal Present- Joint Stiffness. Not Present- Back Pain, Joint Pain, Muscle Pain, Muscle Weakness and Swelling of Extremities. Neurological Not Present- Decreased Memory, Fainting, Headaches, Numbness, Seizures, Tingling, Tremor, Trouble walking and Weakness. Psychiatric Present- Anxiety. Not Present- Bipolar, Change in Sleep Pattern, Depression, Fearful and Frequent crying. Endocrine Present- Hot flashes. Not Present- Cold Intolerance, Excessive Hunger, Hair Changes, Heat Intolerance and New Diabetes.  Vitals (Alisha Spillers CMA; 08/21/2020 10:34 AM) 08/21/2020 10:34 AM Weight: 212 lb Height: 66in Body Surface Area: 2.05 m Body Mass Index: 34.22 kg/m  Pulse: 88 (Regular)  BP: 118/74(Sitting, Left Arm, Standard  Physical Exam [name] MD; 08/21/2020 4:00 PM) General Mental Status-Alert. Orientation-Oriented  X3. Breast Nipples-No Discharge. Note: left breast mass at 3 oclock about 2 cm mobile nontender Lymphatic Head & Neck General  Head & Neck Lymphatics: Bilateral - Description - Normal. Axillary General Axillary Region: Bilateral - Description - Normal. Note: no Wenden adenopathy   Assessment & Plan [name] MD; 08/21/2020 4:03 PM) BREAST CANCER METASTASIZED TO AXILLARY LYMPH NODE (C50.919) Primary systemic therapy, port placement We discussed the staging and pathophysiology of breast cancer. We discussed all of the different options for treatment for breast cancer including surgery, chemotherapy, radiation therapy, Herceptin, and antiestrogen therapy. We discussed the options for treatment of the breast cancer which included lumpectomy versus a mastectomy. We discussed the performance of the lumpectomy with radioactive seed placement. We discussed a 5-10% chance of a positive margin requiring reexcision in the operating room. We also discussed that she will likely need radiation therapy if she undergoes lumpectomy. We discussed mastectomy and the postoperative care for that as well. Mastectomy can be followed by reconstruction. The decision for lumpectomy vs mastectomy has no impact on decision for chemotherapy. she will likely need radiotherapy with mastectomy as well due to nodal disease We discussed that there is no difference in her survival whether she undergoes lumpectomy with radiation therapy or antiestrogen therapy versus a mastectomy. There is also no real difference between her recurrence in the breast. We also discussed reduction lumpectomy and seeing plastic surgery

## 2020-09-05 NOTE — Telephone Encounter (Signed)
Called pt with MRI results and recommendations for US/bx on right breast. Received verbal understanding. No further needs voiced at this time.

## 2020-09-06 ENCOUNTER — Encounter (HOSPITAL_COMMUNITY): Payer: Self-pay | Admitting: General Surgery

## 2020-09-06 ENCOUNTER — Other Ambulatory Visit: Payer: Self-pay

## 2020-09-06 ENCOUNTER — Encounter: Payer: Self-pay | Admitting: *Deleted

## 2020-09-06 ENCOUNTER — Encounter: Payer: Self-pay | Admitting: Licensed Clinical Social Worker

## 2020-09-06 ENCOUNTER — Inpatient Hospital Stay: Payer: BC Managed Care – PPO

## 2020-09-06 VITALS — BP 115/61 | HR 69 | Temp 98.2°F | Resp 14

## 2020-09-06 DIAGNOSIS — Z8041 Family history of malignant neoplasm of ovary: Secondary | ICD-10-CM | POA: Diagnosis not present

## 2020-09-06 DIAGNOSIS — Z801 Family history of malignant neoplasm of trachea, bronchus and lung: Secondary | ICD-10-CM | POA: Diagnosis not present

## 2020-09-06 DIAGNOSIS — Z5189 Encounter for other specified aftercare: Secondary | ICD-10-CM | POA: Diagnosis not present

## 2020-09-06 DIAGNOSIS — C50412 Malignant neoplasm of upper-outer quadrant of left female breast: Secondary | ICD-10-CM | POA: Diagnosis not present

## 2020-09-06 DIAGNOSIS — Z170 Estrogen receptor positive status [ER+]: Secondary | ICD-10-CM | POA: Diagnosis not present

## 2020-09-06 DIAGNOSIS — Z803 Family history of malignant neoplasm of breast: Secondary | ICD-10-CM | POA: Diagnosis not present

## 2020-09-06 DIAGNOSIS — Z79899 Other long term (current) drug therapy: Secondary | ICD-10-CM | POA: Diagnosis not present

## 2020-09-06 DIAGNOSIS — Z5111 Encounter for antineoplastic chemotherapy: Secondary | ICD-10-CM | POA: Diagnosis not present

## 2020-09-06 DIAGNOSIS — K219 Gastro-esophageal reflux disease without esophagitis: Secondary | ICD-10-CM | POA: Diagnosis not present

## 2020-09-06 MED ORDER — PALONOSETRON HCL INJECTION 0.25 MG/5ML
0.2500 mg | Freq: Once | INTRAVENOUS | Status: AC
  Administered 2020-09-06: 0.25 mg via INTRAVENOUS

## 2020-09-06 MED ORDER — SODIUM CHLORIDE 0.9 % IV SOLN
150.0000 mg | Freq: Once | INTRAVENOUS | Status: AC
  Administered 2020-09-06: 150 mg via INTRAVENOUS
  Filled 2020-09-06: qty 150

## 2020-09-06 MED ORDER — SODIUM CHLORIDE 0.9 % IV SOLN
10.0000 mg | Freq: Once | INTRAVENOUS | Status: AC
  Administered 2020-09-06: 10 mg via INTRAVENOUS
  Filled 2020-09-06: qty 10

## 2020-09-06 MED ORDER — SODIUM CHLORIDE 0.9 % IV SOLN
Freq: Once | INTRAVENOUS | Status: AC
  Filled 2020-09-06: qty 250

## 2020-09-06 MED ORDER — DOXORUBICIN HCL CHEMO IV INJECTION 2 MG/ML
60.0000 mg/m2 | Freq: Once | INTRAVENOUS | Status: AC
  Administered 2020-09-06: 128 mg via INTRAVENOUS
  Filled 2020-09-06: qty 64

## 2020-09-06 MED ORDER — SODIUM CHLORIDE 0.9 % IV SOLN
600.0000 mg/m2 | Freq: Once | INTRAVENOUS | Status: AC
  Administered 2020-09-06: 1280 mg via INTRAVENOUS
  Filled 2020-09-06: qty 64

## 2020-09-06 NOTE — Progress Notes (Signed)
Pt discharged in no apparent distress. Pt left ambulatory without assistance. Pt aware of discharge instructions and verbalized understanding and had no further questions.  

## 2020-09-06 NOTE — Progress Notes (Signed)
[facility]  Initial Assessment   Guelda Batson is a 50 y.o. year old female presenting alone for first infusion treatment. Clinical Social Work was referred by new patient protocol for assessment of psychosocial needs.   SDOH (Social Determinants of Health) assessments performed: Yes SDOH Interventions   Flowsheet Row Most Recent Value  SDOH Interventions   Food Insecurity Interventions Intervention Not Indicated  Transportation Interventions Intervention Not Indicated      Distress Screen completed: Yes ONCBCN DISTRESS SCREENING 08/28/2020  Screening Type Initial Screening  Distress experienced in past week (1-10) 0      Family/Social Information:  . Housing Arrangement: patient lives with husband, 48yo & 89yo sons . Family members/support persons in your life? Family and Friends/Colleagues . Transportation concerns: no  . 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 Income source: Employment. Work has been supportive in needing to be flexible with time off . Financial concerns: No o Type of concern: None . Food access concerns: no . Services Currently in place:  Medication for panic disorder, acupuncture  Coping/ Adjustment to diagnosis: . Patient understands treatment plan and what happens next? yes, initially angry about needing chemotherapy, but has reframed and is more accepting. Taking each step moment by moment . Concerns about diagnosis and/or treatment: General adjustment to diagnosis and treatment . Patient reported stressors: Anxiety . Hopes and priorities: hopes to be able to continue doing the things she enjoys as much as possible . Patient enjoys gardening, being outside and hiking . Current coping skills/ strengths: Capable of independent living, Communication skills, General fund of knowledge, Motivation for treatment/growth, Supportive family/friends and Other: anxiety management skills (box breathing, acupressure,  tapping, etc)    SUMMARY: Current SDOH Barriers:  . Panic disorder  Clinical Social Work Clinical Goal(s):  Marland Kitchen Patient will continue to utilize her coping skills and take medication as prescribed to manage panic  Interventions: . Discussed common feeling and emotions when being diagnosed with cancer, and the importance of support during treatment . Informed patient of the support team roles and support services at [facility] . Provided CSW contact information and encouraged patient to call with any questions or concerns   Follow Up Plan: Patient will contact CSW with any support or resource needs. CSW will check-in with patient periodically throughout treatment Patient verbalizes understanding of plan: Yes    Christeen Douglas , LCSW

## 2020-09-06 NOTE — Patient Instructions (Signed)
[facility] Discharge Instructions for Patients Receiving Chemotherapy  Today you received the following chemotherapy agents: Adriamycin, Cytoxan   To help prevent nausea and vomiting after your treatment, we encourage you to take your nausea medication as directed by provider    If you develop nausea and vomiting that is not controlled by your nausea medication, call the clinic.   BELOW ARE SYMPTOMS THAT SHOULD BE REPORTED IMMEDIATELY:  *FEVER GREATER THAN 100.5 F  *CHILLS WITH OR WITHOUT FEVER  NAUSEA AND VOMITING THAT IS NOT CONTROLLED WITH YOUR NAUSEA MEDICATION  *UNUSUAL SHORTNESS OF BREATH  *UNUSUAL BRUISING OR BLEEDING  TENDERNESS IN MOUTH AND THROAT WITH OR WITHOUT PRESENCE OF ULCERS  *URINARY PROBLEMS  *BOWEL PROBLEMS  UNUSUAL RASH Items with * indicate a potential emergency and should be followed up as soon as possible.  Feel free to call the clinic should you have any questions or concerns. The clinic phone number is [phone].  Please show the [place] at check-in to the Emergency Department and triage nurse.

## 2020-09-08 ENCOUNTER — Encounter: Payer: Self-pay | Admitting: Hematology and Oncology

## 2020-09-09 ENCOUNTER — Inpatient Hospital Stay: Payer: BC Managed Care – PPO

## 2020-09-09 ENCOUNTER — Ambulatory Visit
Admission: RE | Admit: 2020-09-09 | Discharge: 2020-09-09 | Disposition: A | Discharge status: Home / Self Care | Payer: BC Managed Care – PPO | Source: Ambulatory Visit | Attending: General Surgery | Admitting: General Surgery

## 2020-09-09 ENCOUNTER — Other Ambulatory Visit: Payer: Self-pay | Admitting: General Surgery

## 2020-09-09 ENCOUNTER — Other Ambulatory Visit: Payer: Self-pay

## 2020-09-09 ENCOUNTER — Other Ambulatory Visit (HOSPITAL_COMMUNITY): Payer: BC Managed Care – PPO

## 2020-09-09 ENCOUNTER — Telehealth: Payer: Self-pay | Admitting: *Deleted

## 2020-09-09 ENCOUNTER — Inpatient Hospital Stay (HOSPITAL_BASED_OUTPATIENT_CLINIC_OR_DEPARTMENT_OTHER): Payer: BC Managed Care – PPO | Admitting: Medical

## 2020-09-09 ENCOUNTER — Other Ambulatory Visit (HOSPITAL_COMMUNITY): Payer: Self-pay | Admitting: Diagnostic Radiology

## 2020-09-09 VITALS — BP 106/59 | HR 48 | Temp 97.7°F | Resp 16

## 2020-09-09 VITALS — BP 112/69 | HR 57 | Temp 97.6°F | Resp 20 | Ht 66.0 in | Wt 211.5 lb

## 2020-09-09 DIAGNOSIS — Z170 Estrogen receptor positive status [ER+]: Secondary | ICD-10-CM

## 2020-09-09 DIAGNOSIS — C50412 Malignant neoplasm of upper-outer quadrant of left female breast: Secondary | ICD-10-CM

## 2020-09-09 DIAGNOSIS — R928 Other abnormal and inconclusive findings on diagnostic imaging of breast: Secondary | ICD-10-CM

## 2020-09-09 DIAGNOSIS — R110 Nausea: Secondary | ICD-10-CM

## 2020-09-09 DIAGNOSIS — G629 Polyneuropathy, unspecified: Secondary | ICD-10-CM | POA: Diagnosis not present

## 2020-09-09 DIAGNOSIS — Z801 Family history of malignant neoplasm of trachea, bronchus and lung: Secondary | ICD-10-CM | POA: Diagnosis not present

## 2020-09-09 DIAGNOSIS — Z5111 Encounter for antineoplastic chemotherapy: Secondary | ICD-10-CM | POA: Diagnosis not present

## 2020-09-09 DIAGNOSIS — N6489 Other specified disorders of breast: Secondary | ICD-10-CM | POA: Diagnosis not present

## 2020-09-09 DIAGNOSIS — F419 Anxiety disorder, unspecified: Secondary | ICD-10-CM | POA: Diagnosis not present

## 2020-09-09 DIAGNOSIS — K219 Gastro-esophageal reflux disease without esophagitis: Secondary | ICD-10-CM | POA: Diagnosis not present

## 2020-09-09 DIAGNOSIS — Z8041 Family history of malignant neoplasm of ovary: Secondary | ICD-10-CM | POA: Diagnosis not present

## 2020-09-09 DIAGNOSIS — Z803 Family history of malignant neoplasm of breast: Secondary | ICD-10-CM | POA: Diagnosis not present

## 2020-09-09 DIAGNOSIS — Z79899 Other long term (current) drug therapy: Secondary | ICD-10-CM | POA: Diagnosis not present

## 2020-09-09 DIAGNOSIS — C50312 Malignant neoplasm of lower-inner quadrant of left female breast: Secondary | ICD-10-CM | POA: Diagnosis not present

## 2020-09-09 DIAGNOSIS — Z5189 Encounter for other specified aftercare: Secondary | ICD-10-CM | POA: Diagnosis not present

## 2020-09-09 IMAGING — US US BREAST*L* LIMITED INC AXILLA
1 series · 6 of 6 positions shown · non-contrast
Comparison: Previous exam(s).

CLINICAL DATA: Patient presents for second-look ultrasound of a 6
mm enhancing mass noted in the posterior, lower inner aspect of the
left breast on her current staging breast MRI dated [DATE].
well as left axillary metastatic adenopathy. She is currently
undergoing neoadjuvant chemotherapy.

EXAM:
ULTRASOUND OF THE LEFT BREAST

[Series 1: us breast*left* limited inc axilla · 0.09mm/px · 6 of 6 slices shown]
[im 1/6]
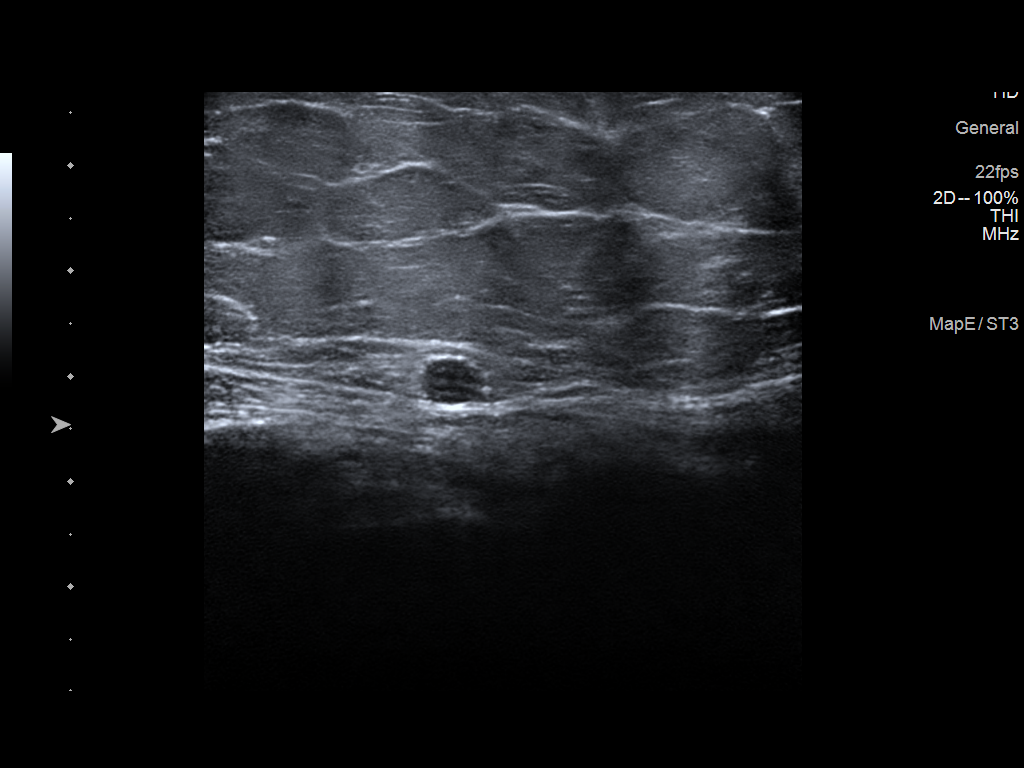
[im 2/6]
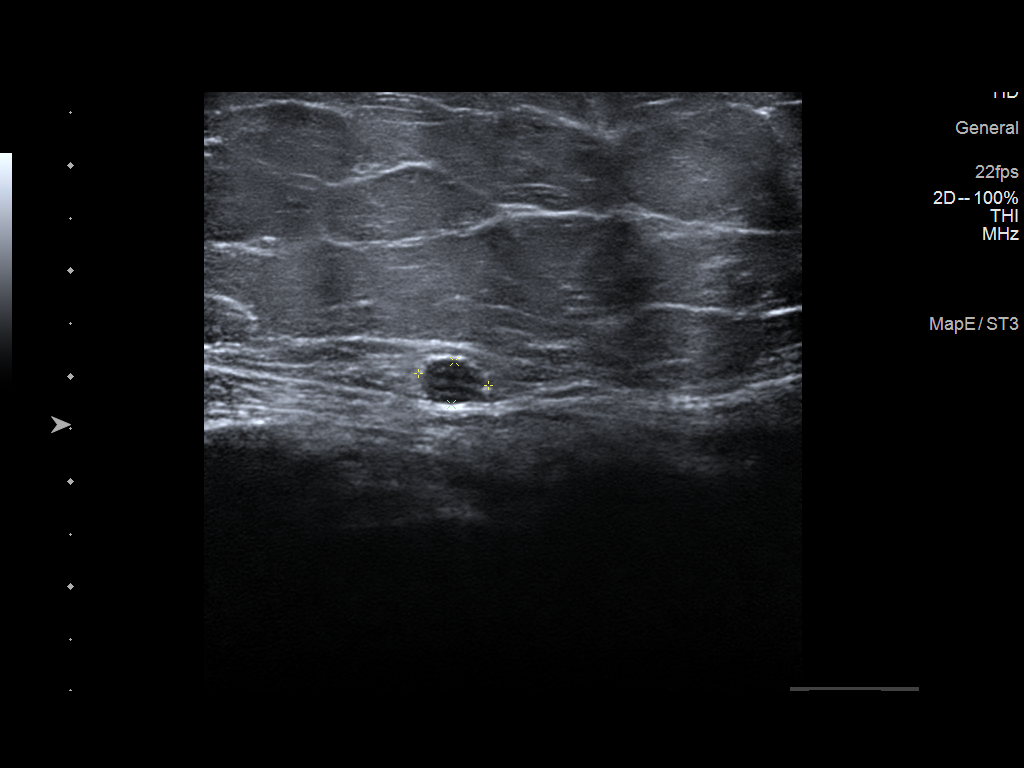
[im 3/6]
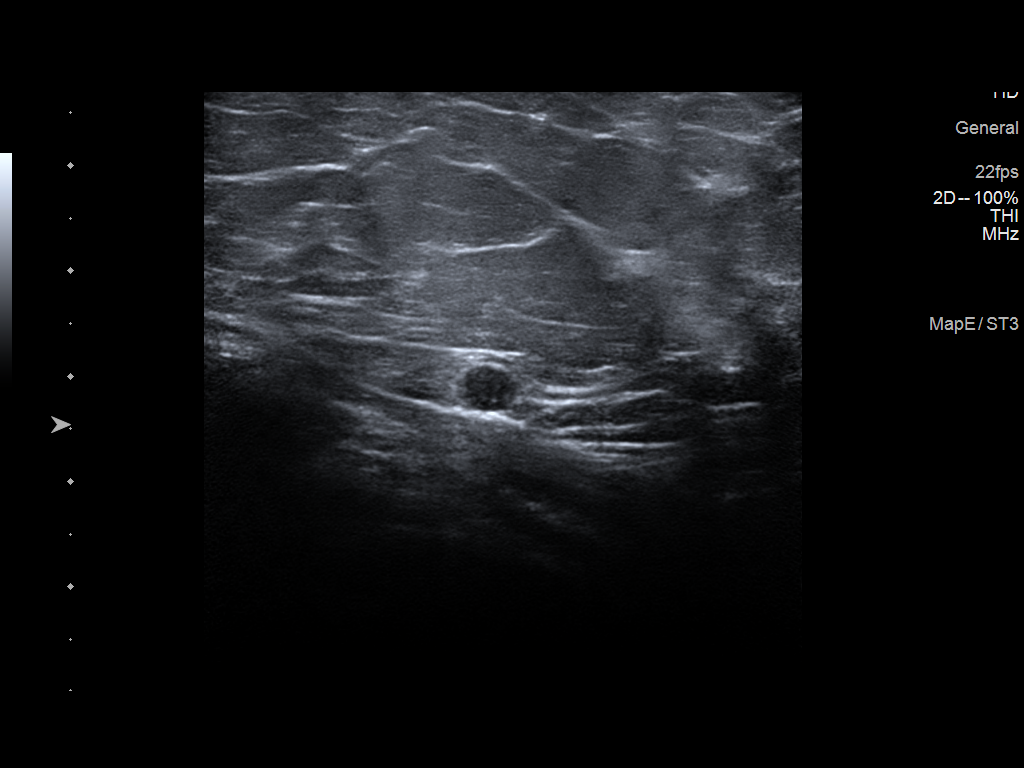
[im 4/6]
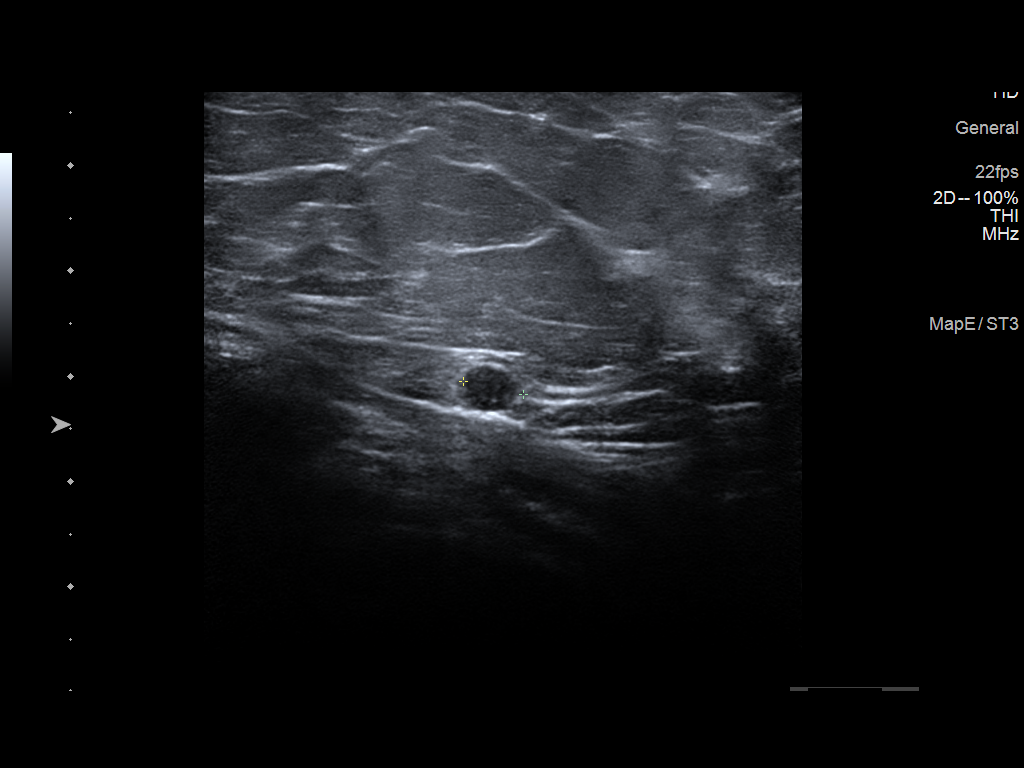
[im 5/6]
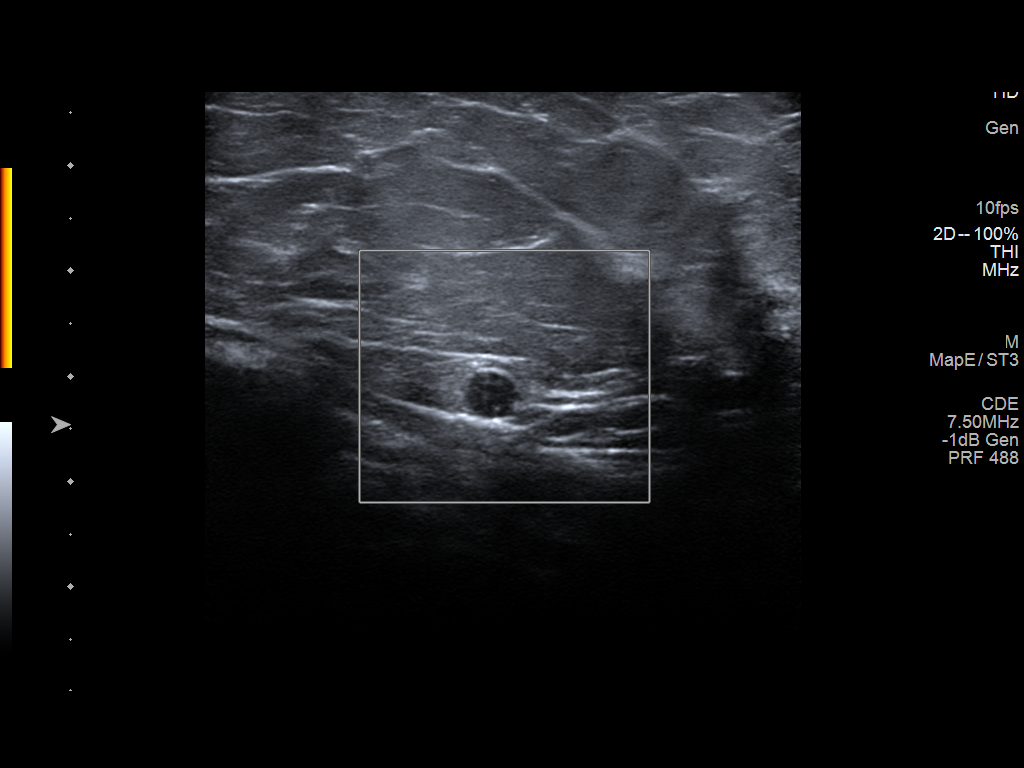
[im 6/6]
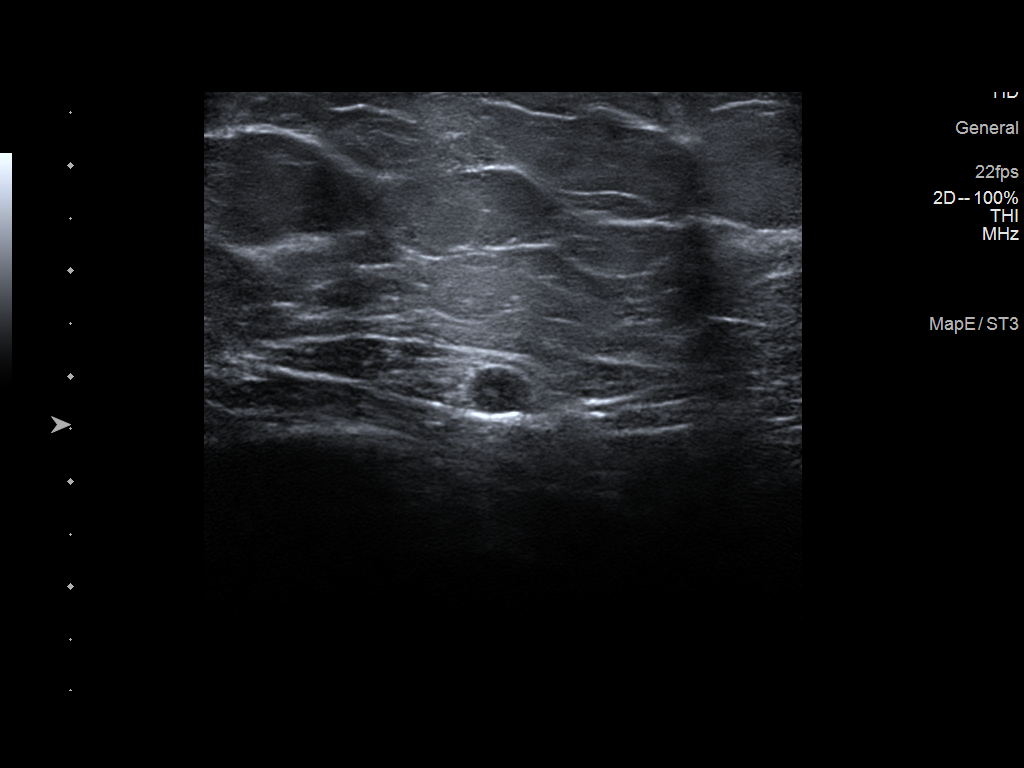

[6 of 6 positions shown; findings below may reference images not displayed]

FINDINGS: Targeted ultrasound is performed, showing a small hypoechoic
circumscribed oval mass in the posterior aspect of the left breast
at 7 o'clock, 7 cm the nipple, just above the inframammary fold. The
mass measures 7 x 4 x 6 mm, consistent in size, shape and location
to the MRI mass. There are no other masses along the medial aspect
of the left breast to correspond to the MRI abnormality.
IMPRESSION: 1. Indeterminate mass in the left breast at 7 o'clock, 7 cm the
nipple, which appears to correspond to the enhancing mass noted in
the medial posterior left breast on MRI.

RECOMMENDATION:
1. Ultrasound-guided core needle biopsy of the 7 o'clock position
left breast mass. This procedure will be performed today.

I have discussed the findings and recommendations with the patient.
If applicable, a reminder letter will be sent to the patient
regarding the next appointment.

BI-RADS CATEGORY  4: Suspicious.

## 2020-09-09 MED ORDER — GABAPENTIN 300 MG PO CAPS: 300.0000 mg | ORAL_CAPSULE | Freq: Every day | ORAL | 5 refills | Status: DC

## 2020-09-09 MED ORDER — CLONAZEPAM 1 MG PO TABS: 1.0000 mg | ORAL_TABLET | Freq: Two times a day (BID) | ORAL | 1 refills | Status: DC | PRN

## 2020-09-09 MED ORDER — PEGFILGRASTIM-JMDB 6 MG/0.6ML ~~LOC~~ SOSY
PREFILLED_SYRINGE | SUBCUTANEOUS | Status: AC
  Filled 2020-09-09: qty 0.6

## 2020-09-09 MED ORDER — PEGFILGRASTIM-JMDB 6 MG/0.6ML ~~LOC~~ SOSY
6.0000 mg | PREFILLED_SYRINGE | Freq: Once | SUBCUTANEOUS | Status: AC
  Administered 2020-09-09: 6 mg via SUBCUTANEOUS

## 2020-09-09 NOTE — Progress Notes (Signed)
[facility] Progress Note   Tracy Dyer [id number] 09/19/1970 50 y.o.  [name] is managed by Dr. [name]  Actively treated with chemotherapy/immunotherapy/hormonal therapy: yes  Current therapy: Adriamycin and Cytoxan  Last treated: 09/06/2020 (cycle 1, day 1)  Next scheduled appointment with provider: 09/13/2020  Assessment: Plan:    Malignant neoplasm of upper-outer quadrant of left breast in female, estrogen receptor positive ([place]) - Plan: Ambulatory Referral to [place]: clonazePAM (KLONOPIN) 1 MG tablet, Consult to spiritual care  Neuropathy - Plan: gabapentin (NEURONTIN) 300 MG capsule   ER positive malignant neoplasm of the left breast: [name] presents to the clinic today status post cycle 1, day 1 of Adriamycin and Cytoxan which was dosed on 09/06/2020.  She is scheduled to be seen by Dr. [name] in follow-up on 09/13/2020.  A consult is also been placed to nutrition.  Anxiety: Patient was given a refill of clonazepam in with instructions that she can take 1 mg p.o. twice daily as needed.  A consult is also been submitted to spiritual care.  Neuropathy: The patient was given a prescription for gabapentin 300 mg p.o. nightly.  Please see After Visit Summary for patient specific instructions.  Future Appointments  Date Time Provider [place]  09/13/2020 10:15 AM [facility] None  09/13/2020 10:30 AM [facility] [place] FL[facility] None  09/13/2020 11:00 AM [name], MD [facility] None  09/20/2020  9:00 AM Tracy, Adrian Prince, PT [facility] None  09/20/2020 12:15 PM [facility] None  09/20/2020 12:30 PM [facility] [place] FLUSH [facility] None  09/20/2020  1:00 PM Causey, Charlestine Massed, NP [facility] None  09/20/2020  2:15 PM [facility] INFUSION [facility] None  09/23/2020  9:00 AM [facility] [place] FL[facility] None  09/23/2020  9:45 AM Neff, Valda Lamb, RD  [facility] None  10/04/2020 11:00 AM [facility] None  10/04/2020 11:15 AM [facility] [place] FLUSH [facility] None  10/04/2020 11:45 AM [name], MD [facility] None  10/04/2020 12:30 PM [facility] INFUSION [facility] None  10/07/2020  9:00 AM [facility] [place] [facility] None    Orders Placed This Encounter  Procedures  . Ambulatory Referral to [facility] Nutrition  . Consult to spiritual care       Subjective:   Patient ID:  Tracy Dyer is a 50 y.o. (DOB 1971/05/30) female.  Chief Complaint: No chief complaint on file.   HPI Tracy Dyer is a 50 y.o. female with a diagnosis of an ER positive malignant neoplasm of the left breast.  She presents to the clinic today status post cycle 1, day 1 of Adriamycin and Cytoxan which was dosed on 09/06/2020.  She has a history of anxiety and panic disorder.  She has been having episodes of anxiety, panic attacks, crying, nausea, and tingling in her fingers and toes.  Her nausea and possible neuropathy began this weekend.  She is on Zoloft 100 mg p.o. nightly and clonazepam 1 mg p.o. nightly.  Despite this she continues to have anxiety attacks.  She also has Zofran and Compazine for nausea.  She is tearful.  She reports that she is frightened and finds herself lying awake at night thinking about her diagnosis.  She is agreeable to talk with someone.  She also reports that she is having changes in the taste of food and is agreeable to talk to a nutritionist.  Medications: I have reviewed the patient's current medications.  Allergies:  Allergies  Allergen Reactions  . Sulfa Antibiotics     Other reaction(s): Rash, urticarial  . Hydromorphone Anxiety  . Oxycodone Anxiety  . Propoxyphene Rash and Anxiety  . Sulfamethoxazole Rash    Feel sun burn    Past Medical History:  Diagnosis Date  . Anxiety   . Cancer [facility])    breast  . Family history of breast cancer   . Family history of lung cancer   . Family history  of ovarian cancer   . GERD (gastroesophageal reflux disease)    diet controlled  . Seasonal allergies     Past Surgical History:  Procedure Laterality Date  . BREAST BIOPSY Left 2019   x 2 biopsy  . BREAST EXCISIONAL BIOPSY Left   . CHOLECYSTECTOMY  2008  . PORTACATH PLACEMENT N/A 09/05/2020   Procedure: INSERTION PORT-A-CATH;  Surgeon: [name], MD;  Location: [facility];  Service: General;  Laterality: N/A;  PEC BLOCK  . WISDOM TOOTH EXTRACTION      Family History  Problem Relation Age of Onset  . Eczema Father   . Allergic rhinitis Brother   . Breast cancer Mother        in 54's  . Cancer Maternal Uncle        unk type  . Lung cancer Maternal Grandfather 36  . Heart Problems Paternal Grandfather   . Melanoma Cousin   . Ovarian cancer Paternal Great-grandmother   . Cancer Paternal Great-grandmother        GYN cancer, possibly ovarian?  . Cancer Maternal Aunt        half aunts/uncles x5- rare cancers    Social History   Socioeconomic History  . Marital status: Married    Spouse name: Not on file  . Number of children: Not on file  . Years of education: Not on file  . Highest education level: Not on file  Occupational History  . Not on file  Tobacco Use  . Smoking status: Never Smoker  . Smokeless tobacco: Never Used  Vaping Use  . Vaping Use: Never used  Substance and Sexual Activity  . Alcohol use: Not Currently  . Drug use: Never  . Sexual activity: Yes    Birth control/protection: None  Other Topics Concern  . Not on file  Social History Narrative  . Not on file   Social Determinants of Health   Financial Resource Strain: Not on file  Food Insecurity: No Food Insecurity  . Worried About Charity fundraiser in the Last Year: Never true  . Ran Out of Food in the Last Year: Never true  Transportation Needs: No Transportation Needs  . Lack of Transportation (Medical): No  . Lack of Transportation (Non-Medical): No  Physical Activity: Not on file   Stress: Not on file  Social Connections: Not on file  Intimate Partner Violence: Not on file    Past Medical History, Surgical history, Social history, and Family history were reviewed and updated as appropriate.   Please see review of systems for further details on the patient's review from today.   Review of Systems:  Review of Systems  Constitutional: Negative for chills, diaphoresis and fever.  HENT: Negative for trouble swallowing and voice change.   Respiratory: Negative for cough, chest tightness, shortness of breath and wheezing.   Cardiovascular: Negative for chest pain and palpitations.  Gastrointestinal: Positive for nausea. Negative for abdominal pain, constipation, diarrhea and vomiting.  Musculoskeletal: Negative for back pain and myalgias.  Neurological:  Positive for numbness. Negative for dizziness, light-headedness and headaches.  Psychiatric/Behavioral: Positive for sleep disturbance. The patient is nervous/anxious.     Objective:   Physical Exam:  LMP 09/05/2020 (Exact Date)  ECOG: 1  Physical Exam Constitutional:      General: She is not in acute distress.    Appearance: She is not ill-appearing.     Comments: The patient is an adult female who is intermittently tearful and anxious but appears to be in no acute distress.  HENT:     Head: Normocephalic and atraumatic.  Neurological:     Coordination: Coordination normal.     Gait: Gait normal.  Psychiatric:        Mood and Affect: Mood is anxious and depressed. Affect is tearful.        Behavior: Behavior is cooperative.     Lab Review:     Component Value Date/Time   NA 141 09/04/2020 1202   K 4.4 09/04/2020 1202   CL 109 09/04/2020 1202   CO2 26 09/04/2020 1202   GLUCOSE 114 (H) 09/04/2020 1202   BUN 11 09/04/2020 1202   CREATININE 0.81 09/04/2020 1202   CALCIUM 8.4 (L) 09/04/2020 1202   PROT 6.4 (L) 09/04/2020 1202   ALBUMIN 3.7 09/04/2020 1202   AST 18 09/04/2020 1202   ALT 16  09/04/2020 1202   ALKPHOS 77 09/04/2020 1202   BILITOT 0.3 09/04/2020 1202   GFRNONAA >60 09/04/2020 1202       Component Value Date/Time   WBC 5.3 09/04/2020 1202   RBC 4.39 09/04/2020 1202   HGB 13.9 09/04/2020 1202   HCT 40.7 09/04/2020 1202   PLT 243 09/04/2020 1202   MCV 92.7 09/04/2020 1202   MCH 31.7 09/04/2020 1202   MCHC 34.2 09/04/2020 1202   RDW 13.2 09/04/2020 1202   LYMPHSABS 1.4 09/04/2020 1202   MONOABS 0.4 09/04/2020 1202   EOSABS 0.1 09/04/2020 1202   BASOSABS 0.1 09/04/2020 1202   -------------------------------  Imaging from last 24 hours (if applicable):  Radiology interpretation: DG Chest 1 View  Result Date: 09/05/2020 CLINICAL DATA:  Port-a-cath placement Please eval for ptx per Dr Donne Hazel EXAM: CHEST  1 VIEW; DG C-ARM 1-60 MIN COMPARISON:  07/12/2018 FINDINGS: Interval placement of right IJ power injectable port catheter, tip near the cavoatrial junction. No evident pneumothorax although the right lateral costophrenic angle is excluded. IMPRESSION: Port placement without evident  pneumothorax. Electronically Signed   By: Lucrezia Europe M.D.   On: 09/05/2020 08:30   MR BREAST BILATERAL W WO CONTRAST INC CAD  Result Date: 09/04/2020 CLINICAL DATA:  50 year old female with newly diagnosed left breast cancer, metastatic to axillary lymph nodes. LABS:  None performed on site. EXAM: BILATERAL BREAST MRI WITH AND WITHOUT CONTRAST TECHNIQUE: Multiplanar, multisequence MR images of both breasts were obtained prior to and following the intravenous administration of 10 ml of Gadavist. Three-dimensional MR images were rendered by post-processing of the original MR data on an independent workstation. The three-dimensional MR images were interpreted, and findings are reported in the following complete MRI report for this study. Three dimensional images were evaluated at the independent interpreting workstation using the DynaCAD thin client. COMPARISON:  Previous exam(s).  FINDINGS: Breast composition: c. Heterogeneous fibroglandular tissue. Background parenchymal enhancement: Moderate. Right breast: No suspicious mass or abnormal enhancement. Left breast: Susceptibility artifact from post biopsy marking clip is seen in association with an irregular, enhancing mass in the upper outer quadrant of the left breast at middle depth (series  5, image 99/160). It measures 2.5 x 2.5 x 2.1 cm and is consistent with the patient's site of biopsy proven malignancy. Susceptibility artifact from an additional post biopsy clip is seen just lateral and inferior to this (series 6, image 102/160). This measures 5 mm and is consistent with the patient's site of biopsy proven fibroadenoma. Additionally, there is a 6 x 6 x 5 mm enhancing mass in the deep lower inner left breast at far posterior depth, abutting the pectoralis muscle (series 6, image 111/160). This demonstrates mild areas of washout with mild T2 hyperintensity. Otherwise, no suspicious mass or abnormal enhancement in the remainder of the breast. Lymph nodes: At least 4 morphologically abnormal level 1 left axillary lymph nodes are noted, 1 of which contains a post biopsy marking clip. No suspicious level 2 or level 3 left axillary lymphadenopathy. No suspicious right axillary lymphadenopathy. No suspicious intramammary lymph nodes. Ancillary findings:  None. IMPRESSION: 1. 2.5 cm mass in the upper outer left breast consistent with the patient's biopsy-proven malignancy. 2. Additional 5 mm mass in the upper outer left breast consistent with biopsy proven fibroadenoma. 3. Indeterminate 6 mm mass in the deep lower inner left breast abutting the pectoralis muscle (series 6, image 111/160). Unfortunately, this would not be amenable to MRI guided biopsy due to its location at the pectoralis muscle. Second-look ultrasound is recommended for further evaluation. 4. At least 4 morphologically abnormal level 1 left axillary lymph nodes, 1 of which has  been previously biopsied. 5. No MRI evidence of malignancy on the right. No suspicious right axillary or internal mammary chain lymphadenopathy. RECOMMENDATION: Second-look ultrasound of the deep lower inner quadrant of the left breast for a 6 mm mass abutting the pectoralis muscle (series 6, image 111/160). Ultrasound-guided biopsy should be performed if this area can be found and safely targeted. Unfortunately, this would not be amenable to MRI guided biopsy due to its location along the pectoralis muscle/chest wall. BI-RADS CATEGORY  4: Suspicious. Electronically Signed   By: Kristopher Oppenheim M.D.   On: 09/04/2020 16:07   DG C-Arm 1-60 Min  Result Date: 09/05/2020 CLINICAL DATA:  Port-a-cath placement Please eval for ptx per Dr Donne Hazel EXAM: CHEST  1 VIEW; DG C-ARM 1-60 MIN COMPARISON:  07/12/2018 FINDINGS: Interval placement of right IJ power injectable port catheter, tip near the cavoatrial junction. No evident pneumothorax although the right lateral costophrenic angle is excluded. IMPRESSION: Port placement without evident  pneumothorax. Electronically Signed   By: Lucrezia Europe M.D.   On: 09/05/2020 08:30   US BREAST COMPLETE UNI LEFT INC AXILLA  Result Date: 08/13/2020 CLINICAL DATA:  Delayed followup for calcifications in the LEFT breast. Today the patient feels a lump in the LEFT breast. Patient's mother was diagnosed with breast cancer in her 30s. EXAM: DIGITAL DIAGNOSTIC BILATERAL MAMMOGRAM WITH CAD AND TOMO ULTRASOUND LEFT BREAST COMPARISON:  Previous exam(s). ACR Breast Density Category c: The breast tissue is heterogeneously dense, which may obscure small masses. FINDINGS: RIGHT BREAST: Mammogram: No suspicious mass, distortion, or microcalcifications are identified to suggest presence of malignancy. mammographic images were processed with CAD. LEFT BREAST: Mammogram: A group of punctate calcifications within the UPPER-OUTER QUADRANT of the LEFT breast appears stable over multiple prior studies.  There has been interval development of a mass in the UPPER-OUTER QUADRANT of the LEFT breast, associated with distortion. Mass is palpable and marked with a BB. Mammographic images were processed with CAD. Physical Exam: I palpate a discrete firm mass in 3 o'clock  location of the LEFT breast 3 centimeters from the nipple. I palpate subtle thickening in the LOWER LEFT axilla. Ultrasound: Targeted ultrasound is performed, showing an irregular mass with irregular margins in the 3 o'clock location of the LEFT breast 3 centimeters from the nipple measuring 1.8 x 1.5 x 1.3 centimeters. In the 3 o'clock location 5 centimeters from the nipple, there is a circumscribed oval mass with posterior acoustic enhancement which measures 0.5 x 0.5 x 1.0 centimeters. There is no internal blood flow by Doppler evaluation. Evaluation of the LEFT axilla shows 2 enlarged lymph nodes with thickened cortex. Cortex measures up to 7 millimeters. Other lymph nodes have normal morphology. IMPRESSION: 1. Stable appearance of LEFT calcifications. 2. Mass in the 3 o'clock location of the LEFT breast 3 centimeters from the nipple suspicious for malignancy. 3. Indeterminate mass in the 3 o'clock location 5 centimeters from the nipple. 4. 2 enlarged LEFT axillary lymph nodes. RECOMMENDATION: 1. Recommend ultrasound-guided core biopsy of mass in the 3 o'clock location 3 centimeters from the nipple. 2. Recommend ultrasound-guided core biopsy of mass in the 3 o'clock location 5 centimeters from the nipple. 3. Recommend ultrasound-guided core biopsy of 1 of the enlarged LEFT axillary lymph nodes. I have discussed the findings and recommendations with the patient. If applicable, a reminder letter will be sent to the patient regarding the next appointment. BI-RADS CATEGORY  5: Highly suggestive of malignancy. Electronically Signed   By: Nolon Nations M.D.   On: 08/13/2020 12:52   MM DIAG BREAST TOMO BILATERAL  Result Date: 08/13/2020 CLINICAL DATA:   Delayed followup for calcifications in the LEFT breast. Today the patient feels a lump in the LEFT breast. Patient's mother was diagnosed with breast cancer in her 75s. EXAM: DIGITAL DIAGNOSTIC BILATERAL MAMMOGRAM WITH CAD AND TOMO ULTRASOUND LEFT BREAST COMPARISON:  Previous exam(s). ACR Breast Density Category c: The breast tissue is heterogeneously dense, which may obscure small masses. FINDINGS: RIGHT BREAST: Mammogram: No suspicious mass, distortion, or microcalcifications are identified to suggest presence of malignancy. mammographic images were processed with CAD. LEFT BREAST: Mammogram: A group of punctate calcifications within the UPPER-OUTER QUADRANT of the LEFT breast appears stable over multiple prior studies. There has been interval development of a mass in the UPPER-OUTER QUADRANT of the LEFT breast, associated with distortion. Mass is palpable and marked with a BB. Mammographic images were processed with CAD. Physical Exam: I palpate a discrete firm mass in 3 o'clock location of the LEFT breast 3 centimeters from the nipple. I palpate subtle thickening in the LOWER LEFT axilla. Ultrasound: Targeted ultrasound is performed, showing an irregular mass with irregular margins in the 3 o'clock location of the LEFT breast 3 centimeters from the nipple measuring 1.8 x 1.5 x 1.3 centimeters. In the 3 o'clock location 5 centimeters from the nipple, there is a circumscribed oval mass with posterior acoustic enhancement which measures 0.5 x 0.5 x 1.0 centimeters. There is no internal blood flow by Doppler evaluation. Evaluation of the LEFT axilla shows 2 enlarged lymph nodes with thickened cortex. Cortex measures up to 7 millimeters. Other lymph nodes have normal morphology. IMPRESSION: 1. Stable appearance of LEFT calcifications. 2. Mass in the 3 o'clock location of the LEFT breast 3 centimeters from the nipple suspicious for malignancy. 3. Indeterminate mass in the 3 o'clock location 5 centimeters from the  nipple. 4. 2 enlarged LEFT axillary lymph nodes. RECOMMENDATION: 1. Recommend ultrasound-guided core biopsy of mass in the 3 o'clock location 3 centimeters from the nipple. 2.  Recommend ultrasound-guided core biopsy of mass in the 3 o'clock location 5 centimeters from the nipple. 3. Recommend ultrasound-guided core biopsy of 1 of the enlarged LEFT axillary lymph nodes. I have discussed the findings and recommendations with the patient. If applicable, a reminder letter will be sent to the patient regarding the next appointment. BI-RADS CATEGORY  5: Highly suggestive of malignancy. Electronically Signed   By: Nolon Nations M.D.   On: 08/13/2020 12:52   ECHOCARDIOGRAM COMPLETE  Result Date: 09/04/2020    ECHOCARDIOGRAM REPORT   Patient Name:   KENYANNA GRZESIAK Tobia Date of Exam: 09/04/2020 Medical Rec #:  [id number]              Height:       66.0 in Accession #:    [id number]             Weight:       207.0 lb Date of Birth:  [birth year]             BSA:          2.029 m Patient Age:    39 years               BP:           118/52 mmHg Patient Gender: F                      HR:           63 bpm. Exam Location:  Outpatient Procedure: 2D Echo and Strain Analysis Indications:    Chemo Z09  History:        Patient has no prior history of Echocardiogram examinations.                 Breast Cancer; Mitral Valve Prolapse.  Sonographer:    Mikki Santee RDCS (AE) Referring Phys: [id number] [name] IMPRESSIONS  1. 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 The regurgitant jet is directed anteriorly. The mitral valve is myxomatous. Mild mitral valve regurgitation. No evidence of mitral stenosis. There is moderate late systolic prolapse of the middle scallop of the posterior leaflet of the mitral valve.  2. Left ventricular ejection fraction, by estimation, is 60 to 65%. The left ventricle has normal function. The left ventricle has no  regional wall motion abnormalities. Left ventricular diastolic parameters were normal.  3. Right ventricular systolic function is normal. The right ventricular size is normal. Tricuspid regurgitation signal is inadequate for assessing PA pressure.  4. The aortic valve is tricuspid. Aortic valve regurgitation is not visualized. No aortic stenosis is present.  5. The inferior vena cava is normal in size with greater than 50% respiratory variability, suggesting right atrial pressure of 3 mmHg. FINDINGS  Left Ventricle: Left ventricular ejection fraction, by estimation, is 60 to 65%. The left ventricle has normal function. The left ventricle has no regional wall motion abnormalities. Global longitudinal strain performed but not reported based on interpreter judgement due to suboptimal tracking. The left ventricular internal cavity size was normal in size. There is no left ventricular hypertrophy. Left ventricular diastolic parameters were normal. Right Ventricle: The right ventricular size is normal. No increase in right ventricular wall thickness. Right ventricular systolic function is normal. Tricuspid regurgitation signal is inadequate for assessing PA pressure. Left Atrium: Left atrial size was normal in size. Right Atrium: Right atrial size was normal in size. Pericardium: Trivial  pericardial effusion is present. Presence of pericardial fat pad. 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 The regurgitant jet is directed anteriorly. The mitral valve is myxomatous. There is moderate late systolic prolapse of the middle scallop of the posterior leaflet of the mitral valve. Mild mitral valve regurgitation, with anteriorly-directed jet. No evidence of mitral valve stenosis. Tricuspid Valve: The tricuspid valve is grossly normal. Tricuspid valve regurgitation is trivial. No evidence of tricuspid stenosis. Aortic Valve: The aortic  valve is tricuspid. Aortic valve regurgitation is not visualized. No aortic stenosis is present. Pulmonic Valve: The pulmonic valve was grossly normal. Pulmonic valve regurgitation is not visualized. No evidence of pulmonic stenosis. Aorta: The aortic root and ascending aorta are structurally normal, with no evidence of dilitation. Venous: The right upper pulmonary vein is normal. The inferior vena cava is normal in size with greater than 50% respiratory variability, suggesting right atrial pressure of 3 mmHg. IAS/Shunts: The atrial septum is grossly normal.  LEFT VENTRICLE PLAX 2D LVIDd:         4.90 cm  Diastology LVIDs:         3.02 cm  LV e' medial:    7.62 cm/s LV PW:         0.91 cm  LV E/e' medial:  13.4 LV IVS:        1.00 cm  LV e' lateral:   9.68 cm/s LVOT diam:     2.00 cm  LV E/e' lateral: 10.5 LV SV:         60 LV SV Index:   30       2D Longitudinal Strain LVOT Area:     3.14 cm 2D Strain GLS Avg:     -17.5 %  RIGHT VENTRICLE RV S prime:     14.70 cm/s TAPSE (M-mode): 2.4 cm LEFT ATRIUM         Index LA diam:    4.40 cm 2.17 cm/m  AORTIC VALVE LVOT Vmax:   90.00 cm/s LVOT Vmean:  54.400 cm/s LVOT VTI:    0.191 m  AORTA Ao Root diam: 3.00 cm Ao Asc diam:  3.20 cm MITRAL VALVE MV Area (PHT): 2.80 cm     SHUNTS MV Decel Time: 271 msec     Systemic VTI:  0.19 m MV E velocity: 102.00 cm/s  Systemic Diam: 2.00 cm MV A velocity: 66.20 cm/s MV E/A ratio:  1.54 Eleonore Chiquito MD Electronically signed by Eleonore Chiquito MD Signature Date/Time: 09/04/2020/8:04:00 PM    Final    [place] AXILLARY NODE CORE BIOPSY LEFT  Addendum Date: 08/15/2020   ADDENDUM REPORT: 08/15/2020 12:38 ADDENDUM: 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 This was found to be concordant by Dr. Nolon Nations. 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 This was found to be concordant by Dr.  Nolon Nations. 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 Findings may represent an entirely replaced lymph node. This was found to be concordant by Dr. Nolon Nations. Pathology results were discussed with the patient by telephone. The patient reported doing well after the biopsies with tenderness at the sites. Post biopsy instructions and care were reviewed and questions were answered. The patient was encouraged to call [facility] for any additional concerns.  Surgical consultation has been arranged with Dr. [name] at [facility] on August 21, 2020. Pathology results reported by Stacie Acres RN on 08/15/2020. Electronically Signed   By: Nolon Nations M.D.   On: 08/15/2020 12:38   Result Date: 08/15/2020 CLINICAL DATA:  Patient presents ultrasound-guided biopsy of LEFT and axilla. EXAM: ULTRASOUND GUIDED LEFT BREAST CORE NEEDLE BIOPSY X2 ULTRASOUND-GUIDED LEFT AXILLARY CORE NEEDLE BIOPSY COMPARISON:  Previous exam(s). PROCEDURE: I met with the patient and we discussed the procedure of ultrasound-guided biopsy, including benefits and alternatives. We discussed the high likelihood of a successful procedure. We discussed the risks of the procedure, including infection, bleeding, tissue injury, clip migration, and inadequate sampling. Informed written consent was given. The usual time-out protocol was performed immediately prior to the procedure. Site 1: LEFT breast 3 o'clock, 3 centimeters from the nipple. Lesion quadrant: LATERAL LEFT breast, coil clip Using sterile technique and 1% lidocaine as local anesthetic, under direct ultrasound visualization, a 12 gauge spring-loaded device was used to perform biopsy of mass in the 3 o'clock location of the LEFT breast using a superolateral approach. At the conclusion of the procedure coil tissue marker clip was deployed into the biopsy cavity. Site 2: LEFT breast 3  o'clock, 5 centimeters from the nipple. Lesion quadrant: LATERAL LEFT breast, heart clip Using sterile technique and 1% lidocaine as local anesthetic, under direct ultrasound visualization, a 12 gauge spring-loaded device was used to perform biopsy of mass in the 3 o'clock location of the LEFT 5 centimeters from the nipple using a superior to inferior approach. At the conclusion of the procedure heart tissue marker clip was deployed into the biopsy cavity. Site 3: LEFT axillary lymph node. Lesion quadrant: LEFT axilla, TRIBELL clip Using sterile technique and 1% lidocaine as local anesthetic, under direct ultrasound visualization, a 12 gauge spring-loaded device was used to perform biopsy of mass in the 3 o'clock location of the LEFT 5 centimeters from the nipple using a superior to inferior approach. At the conclusion of the procedure heart tissue marker clip was deployed into the biopsy cavity. Follow-up 2-view mammogram was performed and dictated separately. IMPRESSION: Ultrasound guided biopsy of 2 masses in the 3 o'clock location of the LEFT breast and enlarged LEFT axillary lymph node. No apparent complications. Electronically Signed: By: Nolon Nations M.D. On: 08/14/2020 09:39   MM CLIP PLACEMENT LEFT  Result Date: 08/14/2020 CLINICAL DATA:  Status post ultrasound-guided core biopsy of 2 masses in the LEFT breast and enlarged LEFT axillary lymph nodes. EXAM: 3D DIAGNOSTIC LEFT MAMMOGRAM POST ULTRASOUND BIOPSY COMPARISON:  Previous exam(s). FINDINGS: 3D Mammographic images were obtained following ultrasound guided biopsy of mass in the 3 o'clock location of the LEFT breast 3 centimeters from the nipple and placement of a coil shaped clip. The biopsy marking clip is in expected position at the site of biopsy. Following biopsy of mass in the 3 o'clock location 5 centimeters from the nipple, a heart shaped clip was placed and is identified in expected location. Following biopsy of enlarged LEFT axillary  lymph node, and TRIBELL clip was placed in is identified in the expected location in the LEFT axilla. The coil shaped and Heart shaped clips are 2.6 centimeters apart. Of note, the ribbon shaped clip previously identified in the LATERAL portion of the LEFT breast was removed at the time of biopsy, likely of the lesion in the 3 o'clock location 5 centimeters. IMPRESSION: Tissue marker clips are in the expected locations after biopsy. Final Assessment: Post  Procedure Mammograms for Marker Placement Electronically Signed   By: Nolon Nations M.D.   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 This was found to be concordant by Dr. Nolon Nations. 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 This was found to be concordant by Dr. Nolon Nations. 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 Findings may represent an entirely replaced lymph node. This was found to be concordant by Dr. Nolon Nations. Pathology results were discussed with the patient by telephone. The patient reported doing well after the biopsies with tenderness at the sites. Post biopsy instructions and care were reviewed and questions were answered. The patient was encouraged to call [facility] for any additional concerns. Surgical consultation has been arranged with Dr. [name] at [facility] on August 21, 2020. Pathology results reported by Stacie Acres RN on 08/15/2020. Electronically Signed   By: Nolon Nations M.D.   On: 08/15/2020 12:38   Result Date: 08/15/2020 CLINICAL DATA:  Patient presents ultrasound-guided biopsy of LEFT and axilla.  EXAM: ULTRASOUND GUIDED LEFT BREAST CORE NEEDLE BIOPSY X2 ULTRASOUND-GUIDED LEFT AXILLARY CORE NEEDLE BIOPSY COMPARISON:  Previous exam(s). PROCEDURE: I met with the patient and we discussed the procedure of ultrasound-guided biopsy, including benefits and alternatives. We discussed the high likelihood of a successful procedure. We discussed the risks of the procedure, including infection, bleeding, tissue injury, clip migration, and inadequate sampling. Informed written consent was given. The usual time-out protocol was performed immediately prior to the procedure. Site 1: LEFT breast 3 o'clock, 3 centimeters from the nipple. Lesion quadrant: LATERAL LEFT breast, coil clip Using sterile technique and 1% lidocaine as local anesthetic, under direct ultrasound visualization, a 12 gauge spring-loaded device was used to perform biopsy of mass in the 3 o'clock location of the LEFT breast using a superolateral approach. At the conclusion of the procedure coil tissue marker clip was deployed into the biopsy cavity. Site 2: LEFT breast 3 o'clock, 5 centimeters from the nipple. Lesion quadrant: LATERAL LEFT breast, heart clip Using sterile technique and 1% lidocaine as local anesthetic, under direct ultrasound visualization, a 12 gauge spring-loaded device was used to perform biopsy of mass in the 3 o'clock location of the LEFT 5 centimeters from the nipple using a superior to inferior approach. At the conclusion of the procedure heart tissue marker clip was deployed into the biopsy cavity. Site 3: LEFT axillary lymph node. Lesion quadrant: LEFT axilla, TRIBELL clip Using sterile technique and 1% lidocaine as local anesthetic, under direct ultrasound visualization, a 12 gauge spring-loaded device was used to perform biopsy of mass in the 3 o'clock location of the LEFT 5 centimeters from the nipple using a superior to inferior approach. At the conclusion of the procedure heart tissue marker clip was deployed into the biopsy  cavity. Follow-up 2-view mammogram was performed and dictated separately. IMPRESSION: Ultrasound guided biopsy of 2 masses in the 3 o'clock location of the LEFT breast and enlarged LEFT axillary lymph node. No apparent complications. Electronically Signed: By: Nolon Nations M.D. 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 This was found to be concordant by Dr. Nolon Nations. 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 This was found to be concordant by Dr. Nolon Nations. 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 Findings may represent an entirely replaced lymph node. This was found to be concordant by Dr. Nolon Nations. Pathology results were discussed with the patient by telephone. The patient reported doing well after the biopsies with tenderness at the sites. Post biopsy instructions and care were reviewed and questions were answered. The patient was encouraged to call [facility] for any additional concerns. Surgical consultation has been arranged with Dr. [name] at [facility] on August 21, 2020. Pathology results reported by Stacie Acres RN on 08/15/2020. Electronically Signed   By: Nolon Nations M.D.   On: 08/15/2020 12:38   Result Date: 08/15/2020 CLINICAL DATA:  Patient presents ultrasound-guided biopsy of LEFT and axilla. EXAM: ULTRASOUND GUIDED LEFT BREAST CORE NEEDLE BIOPSY X2 ULTRASOUND-GUIDED LEFT AXILLARY CORE NEEDLE BIOPSY COMPARISON:  Previous exam(s). PROCEDURE: I met with the patient and we discussed the procedure of ultrasound-guided biopsy, including benefits and  alternatives. We discussed the high likelihood of a successful procedure. We discussed the risks of the procedure, including infection, bleeding, tissue injury, clip migration, and inadequate sampling. Informed written consent was given. The usual time-out protocol was performed immediately prior to the procedure. Site 1: LEFT breast 3 o'clock, 3 centimeters from the nipple. Lesion quadrant: LATERAL LEFT breast, coil clip Using sterile technique and 1% lidocaine as local anesthetic, under direct ultrasound visualization, a 12 gauge spring-loaded device was used to perform biopsy of mass in the 3 o'clock location of the LEFT breast using a superolateral approach. At the conclusion of the procedure coil tissue marker clip was deployed into the biopsy cavity. Site 2: LEFT breast 3 o'clock, 5 centimeters from the nipple. Lesion quadrant: LATERAL LEFT breast, heart clip Using sterile technique and 1% lidocaine as local anesthetic, under direct ultrasound visualization, a 12 gauge spring-loaded device was used to perform biopsy of mass in the 3 o'clock location of the LEFT 5 centimeters from the nipple using a superior to inferior approach. At the conclusion of the procedure heart tissue marker clip was deployed into the biopsy cavity. Site 3: LEFT axillary lymph node. Lesion quadrant: LEFT axilla, TRIBELL clip Using sterile technique and 1% lidocaine as local anesthetic, under direct ultrasound visualization, a 12 gauge spring-loaded device was used to perform biopsy of mass in the 3 o'clock location of the LEFT 5 centimeters from the nipple using a superior to inferior approach. At the conclusion of the procedure heart tissue marker clip was deployed into the biopsy cavity. Follow-up 2-view mammogram was performed and dictated separately. IMPRESSION: Ultrasound guided biopsy of 2 masses in the 3 o'clock location of the LEFT breast and enlarged LEFT axillary lymph node. No apparent complications. Electronically Signed:  By: Nolon Nations M.D. On: 08/14/2020 09:39

## 2020-09-09 NOTE — Patient Instructions (Signed)
Pegfilgrastim injection What is this medicine? PEGFILGRASTIM (PEG fil gra stim) is a long-acting granulocyte colony-stimulating factor that stimulates the growth of neutrophils, a type of white blood cell important in the body's fight against infection. It is used to reduce the incidence of fever and infection in patients with certain types of cancer who are receiving chemotherapy that affects the bone marrow, and to increase survival after being exposed to high doses of radiation. This medicine may be used for other purposes; ask your health care provider or pharmacist if you have questions. COMMON BRAND NAME(S): Fulphila, Neulasta, Nyvepria, UDENYCA, Ziextenzo What should I tell my health care provider before I take this medicine? They need to know if you have any of these conditions:  kidney disease  latex allergy  ongoing radiation therapy  sickle cell disease  skin reactions to acrylic adhesives (On-Body Injector only)  an unusual or allergic reaction to pegfilgrastim, filgrastim, other medicines, foods, dyes, or preservatives  pregnant or trying to get pregnant  breast-feeding How should I use this medicine? This medicine is for injection under the skin. If you get this medicine at home, you will be taught how to prepare and give the pre-filled syringe or how to use the On-body Injector. Refer to the patient Instructions for Use for detailed instructions. Use exactly as directed. Tell your healthcare provider immediately if you suspect that the On-body Injector may not have performed as intended or if you suspect the use of the On-body Injector resulted in a missed or partial dose. It is important that you put your used needles and syringes in a special sharps container. Do not put them in a trash can. If you do not have a sharps container, call your pharmacist or healthcare provider to get one. Talk to your pediatrician regarding the use of this medicine in children. While this drug  may be prescribed for selected conditions, precautions do apply. Overdosage: If you think you have taken too much of this medicine contact a poison control center or emergency room at once. NOTE: This medicine is only for you. Do not share this medicine with others. What if I miss a dose? It is important not to miss your dose. Call your doctor or health care professional if you miss your dose. If you miss a dose due to an On-body Injector failure or leakage, a new dose should be administered as soon as possible using a single prefilled syringe for manual use. What may interact with this medicine? Interactions have not been studied. This list may not describe all possible interactions. Give your health care provider a list of all the medicines, herbs, non-prescription drugs, or dietary supplements you use. Also tell them if you smoke, drink alcohol, or use illegal drugs. Some items may interact with your medicine. What should I watch for while using this medicine? Your condition will be monitored carefully while you are receiving this medicine. You may need blood work done while you are taking this medicine. Talk to your health care provider about your risk of cancer. You may be more at risk for certain types of cancer if you take this medicine. If you are going to need a MRI, CT scan, or other procedure, tell your doctor that you are using this medicine (On-Body Injector only). What side effects may I notice from receiving this medicine? Side effects that you should report to your doctor or health care professional as soon as possible:  allergic reactions (skin rash, itching or hives, swelling of   the face, lips, or tongue)  back pain  dizziness  fever  pain, redness, or irritation at site where injected  pinpoint red spots on the skin  red or dark-brown urine  shortness of breath or breathing problems  stomach or side pain, or pain at the shoulder  swelling  tiredness  trouble  passing urine or change in the amount of urine  unusual bruising or bleeding Side effects that usually do not require medical attention (report to your doctor or health care professional if they continue or are bothersome):  bone pain  muscle pain This list may not describe all possible side effects. Call your doctor for medical advice about side effects. You may report side effects to FDA at 1-800-FDA-1088. Where should I keep my medicine? Keep out of the reach of children. If you are using this medicine at home, you will be instructed on how to store it. Throw away any unused medicine after the expiration date on the label. NOTE: This sheet is a summary. It may not cover all possible information. If you have questions about this medicine, talk to your doctor, pharmacist, or health care provider.  2021 Elsevier/Gold Standard (2019-08-11 13:20:51)  

## 2020-09-09 NOTE — Telephone Encounter (Signed)
Confirmed right [place] and possible bx for today 2/7 with pt. Denies further questions or needs.

## 2020-09-10 ENCOUNTER — Encounter: Payer: Self-pay | Admitting: General Practice

## 2020-09-10 ENCOUNTER — Encounter: Payer: Self-pay | Admitting: Hematology and Oncology

## 2020-09-10 ENCOUNTER — Other Ambulatory Visit: Payer: Self-pay | Admitting: Hematology and Oncology

## 2020-09-10 ENCOUNTER — Encounter: Payer: Self-pay | Admitting: *Deleted

## 2020-09-10 MED ORDER — OMEPRAZOLE 20 MG PO CPDR: 20.0000 mg | DELAYED_RELEASE_CAPSULE | Freq: Every day | ORAL | 3 refills | Status: DC

## 2020-09-10 NOTE — Progress Notes (Signed)
Sent prescription for Prilosec for gastritis. Discussed the second biopsy being positive for breast cancer. She has the option of double lumpectomies vs mastectomy  After completion of neo adj chemo. D/W Dr.Wakefield

## 2020-09-10 NOTE — Progress Notes (Signed)
[facility] Note  Referred by Alfredia Client for tools to cope with anxiety. Reached out by phone, leaving voicemail. Plan to follow up by phone later in the week and put Ms Sunday's next treatment on my calendar for an infusion visit.   [place], [place], [facility] Pager [phone] Voicemail [phone]

## 2020-09-12 NOTE — Addendum Note (Signed)
Addended by: Harle Stanford on: 09/12/2020 02:02 PM   Modules accepted: Orders

## 2020-09-12 NOTE — Progress Notes (Signed)
Patient Care Team: [name], MD as PCP - General (Family Medicine)  DIAGNOSIS:    ICD-10-CM   1. Malignant neoplasm of upper-outer quadrant of left breast in female, estrogen receptor positive ([place])  C50.412    Z17.0     SUMMARY OF ONCOLOGIC HISTORY: Oncology History  Malignant neoplasm of upper-outer quadrant of left breast in female, estrogen receptor positive ([place])  08/21/2020 Initial Diagnosis   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 No pathogenic variants identified on the [facility] Breast Cancer STAT Panel + Multi-Cancer Panel. VUS in [facility] called c.983C>T identified. The report date is 09/04/2020.   The STAT Breast cancer panel offered by [facility] includes sequencing and rearrangement analysis for the following 9 genes:  ATM, BRCA1, BRCA2, CDH1, CHEK2, PALB2, PTEN, STK11 and TP53.    The Multi-Cancer Panel offered by [facility] includes sequencing and/or deletion duplication testing of the following 85 genes: AIP, ALK, APC, ATM, AXIN2,BAP1,  BARD1, BLM, BMPR1A, BRCA1, BRCA2, BRIP1, CASR, CDC73, CDH1, CDK4, CDKN1B, CDKN1C, CDKN2A (p14ARF), CDKN2A (p16INK4a), CEBPA, CHEK2, CTNNA1, DICER1, DIS3L2, EGFR (c.2369C>T, p.Thr790Met variant only), EPCAM (Deletion/duplication testing only), FH, FLCN, GATA2, GPC3, GREM1 (Promoter region deletion/duplication testing only), HOXB13 (c.251G>A, p.Gly84Glu), HRAS, KIT, MAX,  MEN1, MET, MITF (c.952G>A, p.Glu318Lys variant only), MLH1, MSH2, MSH3, MSH6, MUTYH, NBN, NF1, NF2, NTHL1, PALB2, PDGFRA, PHOX2B, PMS2, POLD1, POLE, POT1, PRKAR1A, PTCH1, PTEN, RAD50, RAD51C, RAD51D, RB1, RECQL4, RET, RNF43, RUNX1, SDHAF2, SDHA (sequence changes only), SDHB, SDHC, SDHD, SMAD4, SMARCA4, SMARCB1, SMARCE1, STK11, SUFU, TERC, TERT, TMEM127, TP53, TSC1, TSC2, VHL, WRN and WT1.    09/06/2020 -  Chemotherapy    Patient is on Treatment Plan: BREAST ADJUVANT DOSE DENSE AC Q14D / PACLITAXEL Q7D        CHIEF COMPLIANT: Cycle 1 Day 8 Adriamycin and Cytoxan   INTERVAL HISTORY: Sequita Wise is a 50 y.o. with above-mentioned history of leftbreast cancer. She is currently on neoadjuvant chemotherapy with dose dense Adriamycin and Cytoxan. Her port was placed by Dr. Donne Hazel on 09/05/20. She presents to the clinic today for a toxicity check following cycle 1. Shes feeling a lot better today. She had severe panic attacks on day 1 and 3 on chemo   ALLERGIES:  is allergic to sulfa antibiotics, hydromorphone, oxycodone, propoxyphene, and sulfamethoxazole.  MEDICATIONS:  Current Outpatient Medications  Medication Sig Dispense Refill  . clonazePAM (KLONOPIN) 1 MG tablet Take 1 tablet (1 mg total) by mouth 2 (two) times daily as needed for anxiety. 60 tablet 1  . dexamethasone (DECADRON) 4 MG tablet Take 1 tablet (4 mg total) by mouth daily. Take 1 tablet day after chemo and 1 tablet 2 days after chemo with food 8 tablet 0  . fluticasone (FLONASE) 50 MCG/ACT nasal spray Place 1 spray into both nostrils daily as needed for allergies.    Marland Kitchen gabapentin (NEURONTIN) 300 MG capsule Take 1 capsule (300 mg total) by mouth at bedtime. 30 capsule 5  .  l-methylfolate-B6-B12 (METANX) 3-35-2 MG TABS tablet Take 1 tablet by mouth daily.    Marland Kitchen lidocaine-prilocaine (EMLA) cream Apply to affected area once 30 g 3  . LORazepam (ATIVAN) 0.5 MG tablet Take 1 tablet (0.5 mg total) by mouth once as needed for up  to 1 dose for anxiety. 4 tablet 0  . MAGNESIUM CITRATE PO Take 405 mg by mouth daily. 135 mg    . Multiple Vitamins-Minerals (MULTIVITAMIN WITH MINERALS) tablet Take 1 tablet by mouth daily.    Marland Kitchen omeprazole (PRILOSEC) 20 MG capsule Take 1 capsule (20 mg total) by mouth daily. 30 capsule 3  . ondansetron (ZOFRAN) 8 MG tablet Take 1 tablet (8 mg total) by mouth 2 (two) times daily as needed. Start on the third day after chemotherapy. 30 tablet 1  . prochlorperazine (COMPAZINE) 10 MG tablet Take 1 tablet (10 mg total) by mouth every 6 (six) hours as needed (Nausea or vomiting). 30 tablet 1  . sertraline (ZOLOFT) 100 MG tablet Take 100 mg by mouth at bedtime.    . traMADol (ULTRAM) 50 MG tablet Take 1 tablet (50 mg total) by mouth every 6 (six) hours as needed. 10 tablet 0   No current facility-administered medications for this visit.    PHYSICAL EXAMINATION: ECOG PERFORMANCE STATUS: 1 - Symptomatic but completely ambulatory  Vitals:   09/13/20 1114  BP: (!) 114/55  Pulse: 64  Resp: 17  Temp: 97.7 F (36.5 C)  SpO2: 98%   Filed Weights   09/13/20 1114  Weight: 206 lb (93.4 kg)    LABORATORY DATA:  I have reviewed the data as listed CMP Latest Ref Rng & Units 09/13/2020 09/04/2020  Glucose 70 - 99 mg/dL 118(H) 114(H)  BUN 6 - 20 mg/dL 15 11  Creatinine 0.44 - 1.00 mg/dL 0.73 0.81  Sodium 135 - 145 mmol/L 138 141  Potassium 3.5 - 5.1 mmol/L 4.1 4.4  Chloride 98 - 111 mmol/L 108 109  CO2 22 - 32 mmol/L 24 26  Calcium 8.9 - 10.3 mg/dL 9.1 8.4(L)  Total Protein 6.5 - 8.1 g/dL 6.7 6.4(L)  Total Bilirubin 0.3 - 1.2 mg/dL 0.4 0.3  Alkaline Phos 38 - 126 U/L 85 77  AST 15 - 41 U/L 13(L) 18  ALT 0 - 44 U/L 14 16    Lab Results  Component Value Date   WBC 2.4 (L) 09/13/2020   HGB 13.7 09/13/2020   HCT 39.3 09/13/2020   MCV 91.0 09/13/2020   PLT 176 09/13/2020   NEUTROABS 1.1 (L) 09/13/2020    ASSESSMENT & PLAN:  Malignant neoplasm of upper-outer quadrant of left breast in  female, estrogen receptor positive ([place]) 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%, PR 95%, HER2 equivocal, FISH pending, Ki-67 20% T1CN1 stage IIa MammaPrint: High risk: Luminal type B, probability of path CR 6% with chemo and antiestrogen therapy predicted benefit of treatment at 5 years 94.6%, average 10-year risk of recurrence untreated: 29%  Treatment plan: 1.  Neoadjuvant chemotherapy with dose dense Adriamycin and Cytoxan followed by Taxol weekly x12 2. breast conserving surgery with sentinel lymph node and targeted node dissection 3.  Adjuvant radiation therapy 4.  Followed by adjuvant antiestrogen therapy. ------------------------------------------------------------------------------------------------------------------------- Current treatment: Cycle 1 day 8 dose  dense Adriamycin Cytoxan Echocardiogram 09/04/20 : EF 60-65%  Chemo Toxicities: 1. Gastritis: Sent for Prilosec 2. Nausea 3. Fatigue 4. Severe panic attacks: Will add IV Ativan with day 1 of chemo We can consider giving a dose of Ativan IM on day 3 if necessary  Emotional State is very frail and our team of counselors are providing counseling services to her. She was happt to see that her lab values were in good condition, esp her LFTs  Return to clinic in one week for Cycle 2      No orders of the defined types were placed in this encounter.  The patient has a good understanding of the overall plan. she agrees with it. she will call with any problems that may develop before the next visit here.  Total time spent: 30 mins including face to face time and time spent for planning, charting and coordination of care  Rulon Eisenmenger,  MD, MPH 09/13/2020  I, Molly Dorshimer, am acting as scribe for Dr. [name].  I have reviewed the above documentation for accuracy and completeness, and I agree with the above.

## 2020-09-13 ENCOUNTER — Inpatient Hospital Stay: Payer: BC Managed Care – PPO | Admitting: Hematology and Oncology

## 2020-09-13 ENCOUNTER — Encounter: Payer: Self-pay | Admitting: *Deleted

## 2020-09-13 ENCOUNTER — Inpatient Hospital Stay: Payer: BC Managed Care – PPO

## 2020-09-13 ENCOUNTER — Telehealth: Payer: Self-pay | Admitting: Hematology and Oncology

## 2020-09-13 ENCOUNTER — Other Ambulatory Visit: Payer: Self-pay

## 2020-09-13 DIAGNOSIS — Z79899 Other long term (current) drug therapy: Secondary | ICD-10-CM | POA: Diagnosis not present

## 2020-09-13 DIAGNOSIS — Z5111 Encounter for antineoplastic chemotherapy: Secondary | ICD-10-CM | POA: Diagnosis not present

## 2020-09-13 DIAGNOSIS — C50412 Malignant neoplasm of upper-outer quadrant of left female breast: Secondary | ICD-10-CM

## 2020-09-13 DIAGNOSIS — Z803 Family history of malignant neoplasm of breast: Secondary | ICD-10-CM | POA: Diagnosis not present

## 2020-09-13 DIAGNOSIS — Z170 Estrogen receptor positive status [ER+]: Secondary | ICD-10-CM | POA: Diagnosis not present

## 2020-09-13 DIAGNOSIS — Z801 Family history of malignant neoplasm of trachea, bronchus and lung: Secondary | ICD-10-CM | POA: Diagnosis not present

## 2020-09-13 DIAGNOSIS — K219 Gastro-esophageal reflux disease without esophagitis: Secondary | ICD-10-CM | POA: Diagnosis not present

## 2020-09-13 DIAGNOSIS — Z95828 Presence of other vascular implants and grafts: Secondary | ICD-10-CM

## 2020-09-13 DIAGNOSIS — Z5189 Encounter for other specified aftercare: Secondary | ICD-10-CM | POA: Diagnosis not present

## 2020-09-13 DIAGNOSIS — Z8041 Family history of malignant neoplasm of ovary: Secondary | ICD-10-CM | POA: Diagnosis not present

## 2020-09-13 LAB — CBC WITH DIFFERENTIAL (CANCER CENTER ONLY)
Abs Immature Granulocytes: 0.03 10*3/uL (ref 0.00–0.07)
Basophils Absolute: 0.1 10*3/uL (ref 0.0–0.1)
Basophils Relative: 3 %
Eosinophils Absolute: 0.2 10*3/uL (ref 0.0–0.5)
Eosinophils Relative: 7 %
HCT: 39.3 % (ref 36.0–46.0)
Hemoglobin: 13.7 g/dL (ref 12.0–15.0)
Immature Granulocytes: 1 %
Lymphocytes Relative: 38 %
Lymphs Abs: 0.9 10*3/uL (ref 0.7–4.0)
MCH: 31.7 pg (ref 26.0–34.0)
MCHC: 34.9 g/dL (ref 30.0–36.0)
MCV: 91 fL (ref 80.0–100.0)
Monocytes Absolute: 0.1 10*3/uL (ref 0.1–1.0)
Monocytes Relative: 6 %
Neutro Abs: 1.1 10*3/uL — ABNORMAL LOW (ref 1.7–7.7)
Neutrophils Relative %: 45 %
Platelet Count: 176 10*3/uL (ref 150–400)
RBC: 4.32 MIL/uL (ref 3.87–5.11)
RDW: 12.9 % (ref 11.5–15.5)
WBC Count: 2.4 10*3/uL — ABNORMAL LOW (ref 4.0–10.5)
nRBC: 0 % (ref 0.0–0.2)

## 2020-09-13 LAB — CMP (CANCER CENTER ONLY)
ALT: 14 U/L (ref 0–44)
AST: 13 U/L — ABNORMAL LOW (ref 15–41)
Albumin: 3.8 g/dL (ref 3.5–5.0)
Alkaline Phosphatase: 85 U/L (ref 38–126)
Anion gap: 6 (ref 5–15)
BUN: 15 mg/dL (ref 6–20)
CO2: 24 mmol/L (ref 22–32)
Calcium: 9.1 mg/dL (ref 8.9–10.3)
Chloride: 108 mmol/L (ref 98–111)
Creatinine: 0.73 mg/dL (ref 0.44–1.00)
GFR, Estimated: >60 mL/min (ref >60)
Glucose, Bld: 118 mg/dL — ABNORMAL HIGH (ref 70–99)
Potassium: 4.1 mmol/L (ref 3.5–5.1)
Sodium: 138 mmol/L (ref 135–145)
Total Bilirubin: 0.4 mg/dL (ref 0.3–1.2)
Total Protein: 6.7 g/dL (ref 6.5–8.1)

## 2020-09-13 MED ORDER — SODIUM CHLORIDE 0.9% FLUSH
10.0000 mL | INTRAVENOUS | Status: DC | PRN
  Administered 2020-09-13: 10 mL via INTRAVENOUS
  Filled 2020-09-13: qty 10

## 2020-09-13 MED ORDER — HEPARIN SOD (PORK) LOCK FLUSH 100 UNIT/ML IV SOLN
500.0000 [IU] | Freq: Once | INTRAVENOUS | Status: AC
  Administered 2020-09-13: 500 [IU] via INTRAVENOUS
  Filled 2020-09-13: qty 5

## 2020-09-13 NOTE — Telephone Encounter (Signed)
Rescheduled upcoming appointment and left message with new appointment times. Gave option to call back to reschedule if needed.

## 2020-09-13 NOTE — Assessment & Plan Note (Signed)
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%, PR 95%, HER2 equivocal, FISH pending, Ki-67 20% T1CN1 stage IIa MammaPrint: High risk: Luminal type B, probability of path CR 6% with chemo and antiestrogen therapy predicted benefit of treatment at 5 years 94.6%, average 10-year risk of recurrence untreated: 29%  Treatment plan: 1.  Neoadjuvant chemotherapy with dose dense Adriamycin and Cytoxan followed by Taxol weekly x12 2. breast conserving surgery with sentinel lymph node and targeted node dissection 3.  Adjuvant radiation therapy 4.  Followed by adjuvant antiestrogen therapy. ------------------------------------------------------------------------------------------------------------------------- Current treatment: Cycle 1 day 8 dose dense Adriamycin Cytoxan Echocardiogram 09/04/20 : EF 60-65%  Chemo Toxicities: 1. Gastritis: Sent for Prilosec 2. Nausea 3. fatigue  Emotional State is very frail and our team of counselors are providing counseling services to her.  Return to clinic in one week for Cycle 2

## 2020-09-13 NOTE — Patient Instructions (Signed)
Implanted Port Insertion, Care After This sheet gives you information about how to care for yourself after your procedure. Your health care provider may also give you more specific instructions. If you have problems or questions, contact your health care provider. What can I expect after the procedure? After the procedure, it is common to have:  Discomfort at the port insertion site.  Bruising on the skin over the port. This should improve over 3-4 days. Follow these instructions at home: Port care  After your port is placed, you will get a manufacturer's information card. The card has information about your port. Keep this card with you at all times.  Take care of the port as told by your health care provider. Ask your health care provider if you or a family member can get training for taking care of the port at home. A home health care nurse may also take care of the port.  Make sure to remember what type of port you have. Incision care  Follow instructions from your health care provider about how to take care of your port insertion site. Make sure you: ? Wash your hands with soap and water before and after you change your bandage (dressing). If soap and water are not available, use hand sanitizer. ? Change your dressing as told by your health care provider. ? Leave stitches (sutures), skin glue, or adhesive strips in place. These skin closures may need to stay in place for 2 weeks or longer. If adhesive strip edges start to loosen and curl up, you may trim the loose edges. Do not remove adhesive strips completely unless your health care provider tells you to do that.  Check your port insertion site every day for signs of infection. Check for: ? Redness, swelling, or pain. ? Fluid or blood. ? Warmth. ? Pus or a bad smell.      Activity  Return to your normal activities as told by your health care provider. Ask your health care provider what activities are safe for you.  Do not  lift anything that is heavier than 10 lb (4.5 kg), or the limit that you are told, until your health care provider says that it is safe. General instructions  Take over-the-counter and prescription medicines only as told by your health care provider.  Do not take baths, swim, or use a hot tub until your health care provider approves. Ask your health care provider if you may take showers. You may only be allowed to take sponge baths.  Do not drive for 24 hours if you were given a sedative during your procedure.  Wear a medical alert bracelet in case of an emergency. This will tell any health care providers that you have a port.  Keep all follow-up visits as told by your health care provider. This is important. Contact a health care provider if:  You cannot flush your port with saline as directed, or you cannot draw blood from the port.  You have a fever or chills.  You have redness, swelling, or pain around your port insertion site.  You have fluid or blood coming from your port insertion site.  Your port insertion site feels warm to the touch.  You have pus or a bad smell coming from the port insertion site. Get help right away if:  You have chest pain or shortness of breath.  You have bleeding from your port that you cannot control. Summary  Take care of the port as told by your   health care provider. Keep the manufacturer's information card with you at all times.  Change your dressing as told by your health care provider.  Contact a health care provider if you have a fever or chills or if you have redness, swelling, or pain around your port insertion site.  Keep all follow-up visits as told by your health care provider. This information is not intended to replace advice given to you by your health care provider. Make sure you discuss any questions you have with your health care provider. Document Revised: 02/15/2018 Document Reviewed: 02/15/2018 Elsevier Patient Education   2021 [employer]  

## 2020-09-16 ENCOUNTER — Ambulatory Visit (HOSPITAL_BASED_OUTPATIENT_CLINIC_OR_DEPARTMENT_OTHER): Admit: 2020-09-16 | Payer: BC Managed Care – PPO | Admitting: Plastic Surgery

## 2020-09-16 ENCOUNTER — Encounter (HOSPITAL_BASED_OUTPATIENT_CLINIC_OR_DEPARTMENT_OTHER): Payer: Self-pay

## 2020-09-16 SURGERY — RECONSTRUCTION, BREAST
Anesthesia: General | Site: Breast | Laterality: Bilateral

## 2020-09-18 ENCOUNTER — Encounter: Payer: Self-pay | Admitting: Rehabilitation

## 2020-09-20 ENCOUNTER — Inpatient Hospital Stay: Payer: BC Managed Care – PPO

## 2020-09-20 ENCOUNTER — Ambulatory Visit: Payer: BC Managed Care – PPO | Admitting: Rehabilitation

## 2020-09-20 ENCOUNTER — Other Ambulatory Visit: Payer: Self-pay

## 2020-09-20 ENCOUNTER — Encounter: Payer: Self-pay | Admitting: General Practice

## 2020-09-20 ENCOUNTER — Inpatient Hospital Stay (HOSPITAL_BASED_OUTPATIENT_CLINIC_OR_DEPARTMENT_OTHER): Payer: BC Managed Care – PPO | Admitting: Adult Health

## 2020-09-20 ENCOUNTER — Encounter: Payer: Self-pay | Admitting: Adult Health

## 2020-09-20 VITALS — BP 119/63 | HR 82 | Temp 97.5°F | Resp 18 | Ht 66.0 in | Wt 205.8 lb

## 2020-09-20 DIAGNOSIS — Z170 Estrogen receptor positive status [ER+]: Secondary | ICD-10-CM

## 2020-09-20 DIAGNOSIS — Z79899 Other long term (current) drug therapy: Secondary | ICD-10-CM | POA: Diagnosis not present

## 2020-09-20 DIAGNOSIS — Z803 Family history of malignant neoplasm of breast: Secondary | ICD-10-CM | POA: Diagnosis not present

## 2020-09-20 DIAGNOSIS — D1801 Hemangioma of skin and subcutaneous tissue: Secondary | ICD-10-CM | POA: Diagnosis not present

## 2020-09-20 DIAGNOSIS — R1900 Intra-abdominal and pelvic swelling, mass and lump, unspecified site: Secondary | ICD-10-CM

## 2020-09-20 DIAGNOSIS — C50412 Malignant neoplasm of upper-outer quadrant of left female breast: Secondary | ICD-10-CM

## 2020-09-20 DIAGNOSIS — Z801 Family history of malignant neoplasm of trachea, bronchus and lung: Secondary | ICD-10-CM | POA: Diagnosis not present

## 2020-09-20 DIAGNOSIS — Z5111 Encounter for antineoplastic chemotherapy: Secondary | ICD-10-CM | POA: Diagnosis not present

## 2020-09-20 DIAGNOSIS — Z5189 Encounter for other specified aftercare: Secondary | ICD-10-CM | POA: Diagnosis not present

## 2020-09-20 DIAGNOSIS — K219 Gastro-esophageal reflux disease without esophagitis: Secondary | ICD-10-CM | POA: Diagnosis not present

## 2020-09-20 DIAGNOSIS — D225 Melanocytic nevi of trunk: Secondary | ICD-10-CM | POA: Diagnosis not present

## 2020-09-20 DIAGNOSIS — Z8041 Family history of malignant neoplasm of ovary: Secondary | ICD-10-CM | POA: Diagnosis not present

## 2020-09-20 DIAGNOSIS — L814 Other melanin hyperpigmentation: Secondary | ICD-10-CM | POA: Diagnosis not present

## 2020-09-20 DIAGNOSIS — L821 Other seborrheic keratosis: Secondary | ICD-10-CM | POA: Diagnosis not present

## 2020-09-20 DIAGNOSIS — Z95828 Presence of other vascular implants and grafts: Secondary | ICD-10-CM | POA: Insufficient documentation

## 2020-09-20 LAB — CBC WITH DIFFERENTIAL/PLATELET
Abs Immature Granulocytes: 0 10*3/uL (ref 0.00–0.07)
Band Neutrophils: 2 %
Basophils Absolute: 0 10*3/uL (ref 0.0–0.1)
Basophils Relative: 0 %
Eosinophils Absolute: 0 10*3/uL (ref 0.0–0.5)
Eosinophils Relative: 0 %
HCT: 39.3 % (ref 36.0–46.0)
Hemoglobin: 13.5 g/dL (ref 12.0–15.0)
Lymphocytes Relative: 30 %
Lymphs Abs: 2.7 10*3/uL (ref 0.7–4.0)
MCH: 31.3 pg (ref 26.0–34.0)
MCHC: 34.4 g/dL (ref 30.0–36.0)
MCV: 91 fL (ref 80.0–100.0)
Monocytes Absolute: 0.5 10*3/uL (ref 0.1–1.0)
Monocytes Relative: 6 %
Neutro Abs: 5.7 10*3/uL (ref 1.7–7.7)
Neutrophils Relative %: 62 %
Platelets: 173 10*3/uL (ref 150–400)
RBC: 4.32 MIL/uL (ref 3.87–5.11)
RDW: 13.4 % (ref 11.5–15.5)
WBC: 8.9 10*3/uL (ref 4.0–10.5)
nRBC: 0.2 % (ref 0.0–0.2)

## 2020-09-20 LAB — COMPREHENSIVE METABOLIC PANEL
ALT: 21 U/L (ref 0–44)
AST: 22 U/L (ref 15–41)
Albumin: 4.1 g/dL (ref 3.5–5.0)
Alkaline Phosphatase: 96 U/L (ref 38–126)
Anion gap: 7 (ref 5–15)
BUN: 9 mg/dL (ref 6–20)
CO2: 24 mmol/L (ref 22–32)
Calcium: 9.1 mg/dL (ref 8.9–10.3)
Chloride: 107 mmol/L (ref 98–111)
Creatinine, Ser: 0.89 mg/dL (ref 0.44–1.00)
GFR, Estimated: >60 mL/min (ref >60)
Glucose, Bld: 118 mg/dL — ABNORMAL HIGH (ref 70–99)
Potassium: 4.3 mmol/L (ref 3.5–5.1)
Sodium: 138 mmol/L (ref 135–145)
Total Bilirubin: 0.2 mg/dL — ABNORMAL LOW (ref 0.3–1.2)
Total Protein: 6.8 g/dL (ref 6.5–8.1)

## 2020-09-20 MED ORDER — PALONOSETRON HCL INJECTION 0.25 MG/5ML
INTRAVENOUS | Status: AC
  Filled 2020-09-20: qty 5

## 2020-09-20 MED ORDER — HEPARIN SOD (PORK) LOCK FLUSH 100 UNIT/ML IV SOLN
500.0000 [IU] | Freq: Once | INTRAVENOUS | Status: AC | PRN
  Administered 2020-09-20: 500 [IU]
  Filled 2020-09-20: qty 5

## 2020-09-20 MED ORDER — LORAZEPAM 2 MG/ML IJ SOLN
1.0000 mg | Freq: Once | INTRAMUSCULAR | Status: AC
  Administered 2020-09-20: 1 mg via INTRAVENOUS

## 2020-09-20 MED ORDER — SODIUM CHLORIDE 0.9% FLUSH
10.0000 mL | INTRAVENOUS | Status: DC | PRN
  Administered 2020-09-20: 10 mL
  Filled 2020-09-20: qty 10

## 2020-09-20 MED ORDER — SODIUM CHLORIDE 0.9 % IV SOLN
600.0000 mg/m2 | Freq: Once | INTRAVENOUS | Status: AC
  Administered 2020-09-20: 1280 mg via INTRAVENOUS
  Filled 2020-09-20: qty 64

## 2020-09-20 MED ORDER — DEXAMETHASONE SODIUM PHOSPHATE 100 MG/10ML IJ SOLN
10.0000 mg | Freq: Once | INTRAMUSCULAR | Status: AC
  Administered 2020-09-20: 10 mg via INTRAVENOUS
  Filled 2020-09-20: qty 10

## 2020-09-20 MED ORDER — SODIUM CHLORIDE 0.9 % IV SOLN
150.0000 mg | Freq: Once | INTRAVENOUS | Status: AC
  Administered 2020-09-20: 150 mg via INTRAVENOUS
  Filled 2020-09-20: qty 150

## 2020-09-20 MED ORDER — LORAZEPAM 2 MG/ML IJ SOLN
INTRAMUSCULAR | Status: AC
  Filled 2020-09-20: qty 1

## 2020-09-20 MED ORDER — PALONOSETRON HCL INJECTION 0.25 MG/5ML
0.2500 mg | Freq: Once | INTRAVENOUS | Status: AC
  Administered 2020-09-20: 0.25 mg via INTRAVENOUS

## 2020-09-20 MED ORDER — SODIUM CHLORIDE 0.9 % IV SOLN
Freq: Once | INTRAVENOUS | Status: AC
  Filled 2020-09-20: qty 250

## 2020-09-20 MED ORDER — SODIUM CHLORIDE 0.9% FLUSH
10.0000 mL | Freq: Once | INTRAVENOUS | Status: AC
Start: 2020-09-20 — End: 2020-09-20
  Administered 2020-09-20: 10 mL
  Filled 2020-09-20: qty 10

## 2020-09-20 MED ORDER — DOXORUBICIN HCL CHEMO IV INJECTION 2 MG/ML
60.0000 mg/m2 | Freq: Once | INTRAVENOUS | Status: AC
  Administered 2020-09-20: 128 mg via INTRAVENOUS
  Filled 2020-09-20: qty 64

## 2020-09-20 NOTE — Progress Notes (Signed)
RN contacted [facility] pathology regarding biopsy from 2/7 - RN was provided the contact number [phone] due to being a G[employer] case -  RN contact above number - No receptors sent on bx from 2/7 - Request started for ER, PR, and HER2 receptors to be sent.

## 2020-09-20 NOTE — Progress Notes (Signed)
[facility] Follow up:    Tracy Pepper, MD [address] [place] [address]   DIAGNOSIS: Cancer Staging Malignant neoplasm of upper-outer quadrant of left breast in female, estrogen receptor positive ([place]) Staging form: Breast, AJCC 8th Edition - Clinical stage from 08/21/2020: Stage IIA (cT1c, cN1, cM0, G3, ER+, PR+, HER2-) - Signed by [name], MD on 08/21/2020   SUMMARY OF ONCOLOGIC HISTORY: Oncology History  Malignant neoplasm of upper-outer quadrant of left breast in female, estrogen receptor positive ([place])  08/21/2020 Initial Diagnosis   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 No pathogenic variants identified on the [facility] Breast Cancer STAT Panel + Multi-Cancer Panel. VUS in [facility] called c.983C>T identified. The report date is 09/04/2020.   The STAT Breast cancer panel offered by [facility] includes sequencing and rearrangement analysis for the following 9 genes:  ATM, BRCA1, BRCA2, CDH1, CHEK2, PALB2, PTEN, STK11 and TP53.    The Multi-Cancer Panel offered by [facility] includes sequencing and/or deletion duplication testing of the following 85 genes: AIP, ALK, APC, ATM, AXIN2,BAP1,  BARD1, BLM, BMPR1A, BRCA1, BRCA2, BRIP1, CASR, CDC73, CDH1, CDK4, CDKN1B, CDKN1C, CDKN2A (p14ARF), CDKN2A (p16INK4a), CEBPA, CHEK2, CTNNA1, DICER1, DIS3L2, EGFR (c.2369C>T, p.Thr790Met  variant only), EPCAM (Deletion/duplication testing only), FH, FLCN, GATA2, GPC3, GREM1 (Promoter region deletion/duplication testing only), HOXB13 (c.251G>A, p.Gly84Glu), HRAS, KIT, MAX, MEN1, MET, MITF (c.952G>A, p.Glu318Lys variant only), MLH1, MSH2, MSH3, MSH6, MUTYH, NBN, NF1, NF2, NTHL1, PALB2, PDGFRA, PHOX2B, PMS2, POLD1, POLE, POT1, PRKAR1A, PTCH1, PTEN, RAD50, RAD51C, RAD51D, RB1, RECQL4, RET, RNF43, RUNX1, SDHAF2, SDHA (sequence changes only), SDHB, SDHC, SDHD, SMAD4, SMARCA4, SMARCB1, SMARCE1, STK11, SUFU, TERC, TERT, TMEM127, TP53, TSC1, TSC2, VHL, WRN and WT1.    09/06/2020 -  Chemotherapy    Patient is on Treatment Plan: BREAST ADJUVANT DOSE DENSE AC Q14D / PACLITAXEL Q7D        CURRENT THERAPY: Adriamycin/Cytoxan cycle 2 day 1  INTERVAL HISTORY: Tracy Dyer 50 y.o. female returns for evaluation prior to receiving her second of 4 cycles of dose dense Adriamycin/Cytoxan given every 2 weeks.  She receives [place] biosimilar on day 3.  She is anxious about receiving her treatment due to how poorly she felt after her last one.  She is going to receive some Lorazepam today when she receives her treatment, and would like to receive some IV fluids on her injection day.  She also would like some more detail in how to take her anti nausea medications.  She notes some pelvic fullness that has been going on for the past few weeks, and is concerned about something going on in her ovaries or uterus.   Patient Active Problem List   Diagnosis Date Noted  . Port-A-Cath in place 09/20/2020  . Genetic testing 09/04/2020  . Family history of breast cancer   . Family history of ovarian cancer   . Family history of lung cancer   .  Malignant neoplasm of upper-outer quadrant of left breast in female, estrogen receptor positive ([place]) 08/21/2020  . Anxiety 11/16/2013  . Hypercoagulable state ([place]) 11/16/2013  . TIA (transient ischemic attack) 11/15/2013  . Chest discomfort 01/20/2012  .  Factor V Leiden ([place]) 01/20/2012  . MVP (mitral valve prolapse) 01/20/2012  . Acute bronchitis 12/23/2011    is allergic to sulfa antibiotics, hydromorphone, oxycodone, propoxyphene, and sulfamethoxazole.  MEDICAL HISTORY: Past Medical History:  Diagnosis Date  . Anxiety   . Cancer [facility])    breast  . Family history of breast cancer   . Family history of lung cancer   . Family history of ovarian cancer   . GERD (gastroesophageal reflux disease)    diet controlled  . Seasonal allergies     SURGICAL HISTORY: Past Surgical History:  Procedure Laterality Date  . BREAST BIOPSY Left 2019   x 2 biopsy  . BREAST EXCISIONAL BIOPSY Left   . CHOLECYSTECTOMY  2008  . PORTACATH PLACEMENT N/A 09/05/2020   Procedure: INSERTION PORT-A-CATH;  Surgeon: [name], MD;  Location: [facility];  Service: General;  Laterality: N/A;  PEC BLOCK  . WISDOM TOOTH EXTRACTION      SOCIAL HISTORY: Social History   Socioeconomic History  . Marital status: Married    Spouse name: Not on file  . Number of children: Not on file  . Years of education: Not on file  . Highest education level: Not on file  Occupational History  . Not on file  Tobacco Use  . Smoking status: Never Smoker  . Smokeless tobacco: Never Used  Vaping Use  . Vaping Use: Never used  Substance and Sexual Activity  . Alcohol use: Not Currently  . Drug use: Never  . Sexual activity: Yes    Birth control/protection: None  Other Topics Concern  . Not on file  Social History Narrative  . Not on file   Social Determinants of Health   Financial Resource Strain: Not on file  Food Insecurity: No Food Insecurity  . Worried About Charity fundraiser in the Last Year: Never true  . Ran Out of Food in the Last Year: Never true  Transportation Needs: No Transportation Needs  . Lack of Transportation (Medical): No  . Lack of Transportation (Non-Medical): No  Physical Activity: Not on file  Stress: Not on file  Social  Connections: Not on file  Intimate Partner Violence: Not on file    FAMILY HISTORY: Family History  Problem Relation Age of Onset  . Eczema Father   . Allergic rhinitis Brother   . Breast cancer Mother        in 16's  . Cancer Maternal Uncle        unk type  . Lung cancer Maternal Grandfather 76  . Heart Problems Paternal Grandfather   . Melanoma Cousin   . Ovarian cancer Paternal Great-grandmother   . Cancer Paternal Great-grandmother        GYN cancer, possibly ovarian?  . Cancer Maternal Aunt        half aunts/uncles x5- rare cancers    Review of Systems  Constitutional: Negative for appetite change, chills, fatigue, fever and unexpected weight change.  HENT:   Negative for hearing loss, lump/mass and trouble swallowing.   Eyes: Negative for eye problems and icterus.  Respiratory: Negative for chest tightness, cough and shortness of breath.   Cardiovascular: Negative for chest pain, leg swelling and palpitations.  Gastrointestinal: Negative for abdominal distention, abdominal pain, constipation,  diarrhea, nausea and vomiting.  Endocrine: Negative for hot flashes.  Genitourinary: Negative for difficulty urinating.   Musculoskeletal: Negative for arthralgias.  Skin: Negative for itching and rash.  Neurological: Negative for dizziness, extremity weakness, headaches and numbness.  Hematological: Negative for adenopathy. Does not bruise/bleed easily.  Psychiatric/Behavioral: Negative for depression. The patient is nervous/anxious.       PHYSICAL EXAMINATION  ECOG PERFORMANCE STATUS: 1 - Symptomatic but completely ambulatory  Vitals:   09/20/20 1301  BP: 119/63  Pulse: 82  Resp: 18  Temp: (!) 97.5 F (36.4 C)  SpO2: 94%    Physical Exam Constitutional:      General: She is not in acute distress.    Appearance: Normal appearance. She is not toxic-appearing.  HENT:     Head: Normocephalic and atraumatic.  Eyes:     General: No scleral icterus. Cardiovascular:      Rate and Rhythm: Normal rate and regular rhythm.     Pulses: Normal pulses.     Heart sounds: Normal heart sounds.  Pulmonary:     Effort: Pulmonary effort is normal.     Breath sounds: Normal breath sounds.  Abdominal:     General: Abdomen is flat. Bowel sounds are normal. There is no distension.     Palpations: Abdomen is soft.     Tenderness: There is no abdominal tenderness.  Musculoskeletal:        General: No swelling.     Cervical back: Neck supple.  Lymphadenopathy:     Cervical: No cervical adenopathy.  Skin:    General: Skin is warm and dry.     Findings: No rash.  Neurological:     General: No focal deficit present.     Mental Status: She is alert.  Psychiatric:        Mood and Affect: Mood normal.        Behavior: Behavior normal.     LABORATORY DATA:  CBC    Component Value Date/Time   WBC 8.9 09/20/2020 1246   RBC 4.32 09/20/2020 1246   HGB 13.5 09/20/2020 1246   HGB 13.7 09/13/2020 1048   HCT 39.3 09/20/2020 1246   PLT 173 09/20/2020 1246   PLT 176 09/13/2020 1048   MCV 91.0 09/20/2020 1246   MCH 31.3 09/20/2020 1246   MCHC 34.4 09/20/2020 1246   RDW 13.4 09/20/2020 1246   LYMPHSABS 2.7 09/20/2020 1246   MONOABS 0.5 09/20/2020 1246   EOSABS 0.0 09/20/2020 1246   BASOSABS 0.0 09/20/2020 1246    CMP     Component Value Date/Time   NA 138 09/20/2020 1246   K 4.3 09/20/2020 1246   CL 107 09/20/2020 1246   CO2 24 09/20/2020 1246   GLUCOSE 118 (H) 09/20/2020 1246   BUN 9 09/20/2020 1246   CREATININE 0.89 09/20/2020 1246   CREATININE 0.73 09/13/2020 1048   CALCIUM 9.1 09/20/2020 1246   PROT 6.8 09/20/2020 1246   ALBUMIN 4.1 09/20/2020 1246   AST 22 09/20/2020 1246   AST 13 (L) 09/13/2020 1048   ALT 21 09/20/2020 1246   ALT 14 09/13/2020 1048   ALKPHOS 96 09/20/2020 1246   BILITOT 0.2 (L) 09/20/2020 1246   BILITOT 0.4 09/13/2020 1048   GFRNONAA >60 09/20/2020 1246   GFRNONAA >60 09/13/2020 1048           ASSESSMENT and  THERAPY PLAN:   Malignant neoplasm of upper-outer quadrant of left breast in female, estrogen receptor positive ([facility]) Screening mammogram detected punctate calcifications  left breast UOQ: Stable, interval development of mass UOQ left breast with distortion 1.8 cm (3 cm from nipple), 3 o'clock position 5 cm from nipple 1 cm mass: Biopsy benign, concordant, fibroadenoma, 2 enlarged lymph nodes: Positive, breast biopsy revealed grade 3 IDC ER 95%, PR 95%, HER2 equivocal, FISH pending, Ki-67 20% T1CN1 stage IIa MammaPrint: High risk: Luminal type B, probability of path CR 6% with chemo and antiestrogen therapy predicted benefit of treatment at 5 years 94.6%, average 10-year risk of recurrence untreated: 29%  Treatment plan: 1.  Neoadjuvant chemotherapy with dose dense Adriamycin and Cytoxan followed by Taxol weekly x12 2. breast conserving surgery with sentinel lymph node and targeted node dissection 3.  Adjuvant radiation therapy 4.  Followed by adjuvant antiestrogen therapy. ------------------------------------------------------------------------------------------------------------------------- Current treatment: Cycle 2 day 1 dose dense Adriamycin Cytoxan Echocardiogram 09/04/20 : EF 60-65%  Chemo Toxicities: She is doing well today.  I suggested she take Omeprazole daily, first thing in the morning.  We discussed her anti nausea medications.  She will receive Lorazepam today and IV fluids on Monday when she returns for her Neulasta biosimilar injection. I placed an order for a pelvic ultrasound to fully evaluate her pelvic fullness.    We will see her back in 2 weeks for labs, f/u, and cycle 3 of chemotherapy.       Orders Placed This Encounter  Procedures  . US PELVIC COMPLETE WITH TRANSVAGINAL    Standing Status:   Future    Standing Expiration Date:   09/20/2021    Order Specific Question:   Reason for Exam (SYMPTOM  OR DIAGNOSIS REQUIRED)    Answer:   pelvic fullness    Order  Specific Question:   Preferred imaging location?    Answer:   [facility]    All questions were answered. The patient knows to call the clinic with any problems, questions or concerns. We can certainly see the patient much sooner if necessary.  Total encounter time: 20 minutes*  Wilber Bihari, NP 09/23/20 9:48 PM Medical Oncology and Hematology [facility] [place], [place] [address] Tel. [phone]    Fax. [phone]  *Total Encounter Time as defined by the Centers for Medicare and Medicaid Services includes, in addition to the face-to-face time of a patient visit (documented in the note above) non-face-to-face time: obtaining and reviewing outside history, ordering and reviewing medications, tests or procedures, care coordination (communications with other health care professionals or caregivers) and documentation in the medical record.

## 2020-09-20 NOTE — Patient Instructions (Signed)
[facility] Discharge Instructions for Patients Receiving Chemotherapy  Today you received the following chemotherapy agents Doxorubicin(Adramycin), Cyclophosphamide(Cytoxan)  To help prevent nausea and vomiting after your treatment, we encourage you to take your nausea medication as directed.  If you develop nausea and vomiting that is not controlled by your nausea medication, call the clinic.   BELOW ARE SYMPTOMS THAT SHOULD BE REPORTED IMMEDIATELY:  *FEVER GREATER THAN 100.5 F  *CHILLS WITH OR WITHOUT FEVER  NAUSEA AND VOMITING THAT IS NOT CONTROLLED WITH YOUR NAUSEA MEDICATION  *UNUSUAL SHORTNESS OF BREATH  *UNUSUAL BRUISING OR BLEEDING  TENDERNESS IN MOUTH AND THROAT WITH OR WITHOUT PRESENCE OF ULCERS  *URINARY PROBLEMS  *BOWEL PROBLEMS  UNUSUAL RASH Items with * indicate a potential emergency and should be followed up as soon as possible.  Feel free to call the clinic should you have any questions or concerns. The clinic phone number is [phone].  Please show the CHEMO ALERT CARD at check-in to the Emergency Department and triage nurse.   

## 2020-09-20 NOTE — Progress Notes (Signed)
[facility] Spiritual Care Note  Followed up with Tracy Dyer in infusion, building rapport and providing opportunity for her to share and process her recent stressors (mostly related to treatment), associated anxiety, and strong support. She is using perspective, gratitude, and creative practices to cope. Also provided a set of mindfulness cards with very simple exercises to interrupt anxiety and help one slow down to be present in the moment. Tracy Dyer engaged deeply and verbalized appreciation for Spiritual Care as an additional layer of support. Because she anticipates feeling very bad after treatment, we plan to follow up in person at her next treatment. She has my direct dial number in case needs come up in the meantime.   [place], [place], [facility] Pager [phone] Voicemail [phone]

## 2020-09-20 NOTE — Assessment & Plan Note (Addendum)
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%, PR 95%, HER2 equivocal, FISH pending, Ki-67 20% T1CN1 stage IIa MammaPrint: High risk: Luminal type B, probability of path CR 6% with chemo and antiestrogen therapy predicted benefit of treatment at 5 years 94.6%, average 10-year risk of recurrence untreated: 29%  Treatment plan: 1.  Neoadjuvant chemotherapy with dose dense Adriamycin and Cytoxan followed by Taxol weekly x12 2. breast conserving surgery with sentinel lymph node and targeted node dissection 3.  Adjuvant radiation therapy 4.  Followed by adjuvant antiestrogen therapy. ------------------------------------------------------------------------------------------------------------------------- Current treatment: Cycle 2 day 1 dose dense Adriamycin Cytoxan Echocardiogram 09/04/20 : EF 60-65%  Chemo Toxicities: She is doing well today.  I suggested she take Omeprazole daily, first thing in the morning.  We discussed her anti nausea medications.  She will receive Lorazepam today and IV fluids on Monday when she returns for her Neulasta biosimilar injection. I placed an order for a pelvic ultrasound to fully evaluate her pelvic fullness.    We will see her back in 2 weeks for labs, f/u, and cycle 3 of chemotherapy.     

## 2020-09-21 ENCOUNTER — Encounter: Payer: Self-pay | Admitting: Hematology and Oncology

## 2020-09-23 ENCOUNTER — Inpatient Hospital Stay: Payer: BC Managed Care – PPO

## 2020-09-23 ENCOUNTER — Encounter: Payer: BC Managed Care – PPO | Admitting: Nutrition

## 2020-09-23 ENCOUNTER — Inpatient Hospital Stay: Payer: BC Managed Care – PPO | Admitting: Nutrition

## 2020-09-23 ENCOUNTER — Other Ambulatory Visit: Payer: Self-pay

## 2020-09-23 VITALS — BP 97/57 | HR 60 | Temp 97.9°F | Resp 18

## 2020-09-23 DIAGNOSIS — Z170 Estrogen receptor positive status [ER+]: Secondary | ICD-10-CM | POA: Diagnosis not present

## 2020-09-23 DIAGNOSIS — C50412 Malignant neoplasm of upper-outer quadrant of left female breast: Secondary | ICD-10-CM

## 2020-09-23 DIAGNOSIS — Z803 Family history of malignant neoplasm of breast: Secondary | ICD-10-CM | POA: Diagnosis not present

## 2020-09-23 DIAGNOSIS — Z79899 Other long term (current) drug therapy: Secondary | ICD-10-CM | POA: Diagnosis not present

## 2020-09-23 DIAGNOSIS — K219 Gastro-esophageal reflux disease without esophagitis: Secondary | ICD-10-CM | POA: Diagnosis not present

## 2020-09-23 DIAGNOSIS — Z5111 Encounter for antineoplastic chemotherapy: Secondary | ICD-10-CM | POA: Diagnosis not present

## 2020-09-23 DIAGNOSIS — Z5189 Encounter for other specified aftercare: Secondary | ICD-10-CM | POA: Diagnosis not present

## 2020-09-23 DIAGNOSIS — Z8041 Family history of malignant neoplasm of ovary: Secondary | ICD-10-CM | POA: Diagnosis not present

## 2020-09-23 DIAGNOSIS — Z801 Family history of malignant neoplasm of trachea, bronchus and lung: Secondary | ICD-10-CM | POA: Diagnosis not present

## 2020-09-23 MED ORDER — SODIUM CHLORIDE 0.9 % IV SOLN
Freq: Once | INTRAVENOUS | Status: AC
  Filled 2020-09-23: qty 250

## 2020-09-23 MED ORDER — PEGFILGRASTIM-JMDB 6 MG/0.6ML ~~LOC~~ SOSY
6.0000 mg | PREFILLED_SYRINGE | Freq: Once | SUBCUTANEOUS | Status: AC
Start: 2020-09-23 — End: 2020-09-23
  Administered 2020-09-23: 6 mg via SUBCUTANEOUS

## 2020-09-23 MED ORDER — PEGFILGRASTIM-JMDB 6 MG/0.6ML ~~LOC~~ SOSY
PREFILLED_SYRINGE | SUBCUTANEOUS | Status: AC
  Filled 2020-09-23: qty 0.6

## 2020-09-23 NOTE — Patient Instructions (Signed)
Pegfilgrastim injection What is this medicine? PEGFILGRASTIM (PEG fil gra stim) is a long-acting granulocyte colony-stimulating factor that stimulates the growth of neutrophils, a type of white blood cell important in the body's fight against infection. It is used to reduce the incidence of fever and infection in patients with certain types of cancer who are receiving chemotherapy that affects the bone marrow, and to increase survival after being exposed to high doses of radiation. This medicine may be used for other purposes; ask your health care provider or pharmacist if you have questions. COMMON BRAND NAME(S): Fulphila, Neulasta, Nyvepria, UDENYCA, Ziextenzo What should I tell my health care provider before I take this medicine? They need to know if you have any of these conditions:  kidney disease  latex allergy  ongoing radiation therapy  sickle cell disease  skin reactions to acrylic adhesives (On-Body Injector only)  an unusual or allergic reaction to pegfilgrastim, filgrastim, other medicines, foods, dyes, or preservatives  pregnant or trying to get pregnant  breast-feeding How should I use this medicine? This medicine is for injection under the skin. If you get this medicine at home, you will be taught how to prepare and give the pre-filled syringe or how to use the On-body Injector. Refer to the patient Instructions for Use for detailed instructions. Use exactly as directed. Tell your healthcare provider immediately if you suspect that the On-body Injector may not have performed as intended or if you suspect the use of the On-body Injector resulted in a missed or partial dose. It is important that you put your used needles and syringes in a special sharps container. Do not put them in a trash can. If you do not have a sharps container, call your pharmacist or healthcare provider to get one. Talk to your pediatrician regarding the use of this medicine in children. While this drug  may be prescribed for selected conditions, precautions do apply. Overdosage: If you think you have taken too much of this medicine contact a poison control center or emergency room at once. NOTE: This medicine is only for you. Do not share this medicine with others. What if I miss a dose? It is important not to miss your dose. Call your doctor or health care professional if you miss your dose. If you miss a dose due to an On-body Injector failure or leakage, a new dose should be administered as soon as possible using a single prefilled syringe for manual use. What may interact with this medicine? Interactions have not been studied. This list may not describe all possible interactions. Give your health care provider a list of all the medicines, herbs, non-prescription drugs, or dietary supplements you use. Also tell them if you smoke, drink alcohol, or use illegal drugs. Some items may interact with your medicine. What should I watch for while using this medicine? Your condition will be monitored carefully while you are receiving this medicine. You may need blood work done while you are taking this medicine. Talk to your health care provider about your risk of cancer. You may be more at risk for certain types of cancer if you take this medicine. If you are going to need a MRI, CT scan, or other procedure, tell your doctor that you are using this medicine (On-Body Injector only). What side effects may I notice from receiving this medicine? Side effects that you should report to your doctor or health care professional as soon as possible:  allergic reactions (skin rash, itching or hives, swelling of   the face, lips, or tongue)  back pain  dizziness  fever  pain, redness, or irritation at site where injected  pinpoint red spots on the skin  red or dark-brown urine  shortness of breath or breathing problems  stomach or side pain, or pain at the shoulder  swelling  tiredness  trouble  passing urine or change in the amount of urine  unusual bruising or bleeding Side effects that usually do not require medical attention (report to your doctor or health care professional if they continue or are bothersome):  bone pain  muscle pain This list may not describe all possible side effects. Call your doctor for medical advice about side effects. You may report side effects to FDA at 1-800-FDA-1088. Where should I keep my medicine? Keep out of the reach of children. If you are using this medicine at home, you will be instructed on how to store it. Throw away any unused medicine after the expiration date on the label. NOTE: This sheet is a summary. It may not cover all possible information. If you have questions about this medicine, talk to your doctor, pharmacist, or health care provider.  2021 Elsevier/Gold Standard (2019-08-11 13:20:51)  

## 2020-09-23 NOTE — Progress Notes (Unsigned)
Nutrition Assessment  Reason for Assessment: No chief complaint on file.    ASSESSMENT: ***   NUTRITION - FOCUSED PHYSICAL EXAM:    MEDICATIONS: ***   LABS: ***   ANTHROPOMETRICS: Height:   Ht Readings from Last 1 Encounters:  09/20/20 5\' 6"  (1.676 m)   Weight:  Wt Readings from Last 1 Encounters:  09/20/20 205 lb 12.8 oz (93.4 kg)   BMI:   BMI Readings from Last 1 Encounters:  09/20/20 33.22 kg/m   UBW:  No data recorded IBW:  No data recorded   ESTIMATED ENERGY NEEDS:  Kcal:    Protein:   Fluid:      NUTRITION DIAGNOSIS:   No data recorded related to No data recorded as evidenced by No data recorded.   DOCUMENTATION CODES:      INTERVENTION:     GOAL:      MONITOR:      Next Visit: ***   Trig Mcbryar, RD    Patient arrived 20 min late to check in for nutrition appointment and did not come to office for appointment. I offered to call her at my next available time slot however patient preferred to come in for an appointment.  I rescheduled her for March 7 after her injection.

## 2020-09-23 NOTE — Progress Notes (Signed)
MD notified of pt low blood pressure, nausea, malaise and fatigue. Scheduling message sent for pt to return for fluids Thursday.

## 2020-09-26 ENCOUNTER — Other Ambulatory Visit: Payer: Self-pay

## 2020-09-26 ENCOUNTER — Inpatient Hospital Stay: Payer: BC Managed Care – PPO

## 2020-09-26 ENCOUNTER — Other Ambulatory Visit: Payer: Self-pay | Admitting: *Deleted

## 2020-09-26 VITALS — BP 103/59 | HR 64 | Temp 98.0°F | Resp 18

## 2020-09-26 DIAGNOSIS — Z803 Family history of malignant neoplasm of breast: Secondary | ICD-10-CM | POA: Diagnosis not present

## 2020-09-26 DIAGNOSIS — K219 Gastro-esophageal reflux disease without esophagitis: Secondary | ICD-10-CM | POA: Diagnosis not present

## 2020-09-26 DIAGNOSIS — Z5111 Encounter for antineoplastic chemotherapy: Secondary | ICD-10-CM | POA: Diagnosis not present

## 2020-09-26 DIAGNOSIS — Z170 Estrogen receptor positive status [ER+]: Secondary | ICD-10-CM

## 2020-09-26 DIAGNOSIS — Z95828 Presence of other vascular implants and grafts: Secondary | ICD-10-CM

## 2020-09-26 DIAGNOSIS — Z79899 Other long term (current) drug therapy: Secondary | ICD-10-CM | POA: Diagnosis not present

## 2020-09-26 DIAGNOSIS — C50412 Malignant neoplasm of upper-outer quadrant of left female breast: Secondary | ICD-10-CM

## 2020-09-26 DIAGNOSIS — Z8041 Family history of malignant neoplasm of ovary: Secondary | ICD-10-CM | POA: Diagnosis not present

## 2020-09-26 DIAGNOSIS — Z5189 Encounter for other specified aftercare: Secondary | ICD-10-CM | POA: Diagnosis not present

## 2020-09-26 DIAGNOSIS — Z801 Family history of malignant neoplasm of trachea, bronchus and lung: Secondary | ICD-10-CM | POA: Diagnosis not present

## 2020-09-26 MED ORDER — SODIUM CHLORIDE 0.9% FLUSH
10.0000 mL | Freq: Once | INTRAVENOUS | Status: AC
  Administered 2020-09-26: 10 mL
  Filled 2020-09-26: qty 10

## 2020-09-26 MED ORDER — SODIUM CHLORIDE 0.9 % IV SOLN
Freq: Once | INTRAVENOUS | Status: AC
  Filled 2020-09-26: qty 250

## 2020-09-26 MED ORDER — HEPARIN SOD (PORK) LOCK FLUSH 100 UNIT/ML IV SOLN
500.0000 [IU] | Freq: Once | INTRAVENOUS | Status: AC
Start: 2020-09-26 — End: 2020-09-26
  Administered 2020-09-26: 500 [IU]
  Filled 2020-09-26: qty 5

## 2020-09-26 NOTE — Progress Notes (Signed)
Per MD pt to receive 1 L NS over 1 hour today.  Orders placed under sign and held for RN to release.

## 2020-09-26 NOTE — Patient Instructions (Signed)
Rehydration, Adult Rehydration is the replacement of body fluids, salts, and minerals (electrolytes) that are lost during dehydration. Dehydration is when there is not enough water or other fluids in the body. This happens when you lose more fluids than you take in. Common causes of dehydration include:  Not drinking enough fluids. This can occur when you are ill or doing activities that require a lot of energy, especially in hot weather.  Conditions that cause loss of water or other fluids, such as diarrhea, vomiting, sweating, or urinating a lot.  Other illnesses, such as fever or infection.  Certain medicines, such as those that remove excess fluid from the body (diuretics). Symptoms of mild or moderate dehydration may include thirst, dry lips and mouth, and dizziness. Symptoms of severe dehydration may include increased heart rate, confusion, fainting, and not urinating. For severe dehydration, you may need to get fluids through an IV at the hospital. For mild or moderate dehydration, you can usually rehydrate at home by drinking certain fluids as told by your health care provider. What are the risks? Generally, rehydration is safe. However, taking in too much fluid (overhydration) can be a problem. This is rare. Overhydration can cause an electrolyte imbalance, kidney failure, or a decrease in salt (sodium) levels in the body. Supplies needed You will need an oral rehydration solution (ORS) if your health care provider tells you to use one. This is a drink to treat dehydration. It can be found in pharmacies and retail stores. How to rehydrate Fluids Follow instructions from your health care provider for rehydration. The kind of fluid and the amount you should drink depend on your condition. In general, you should choose drinks that you prefer.  If told by your health care provider, drink an ORS. ? Make an ORS by following instructions on the package. ? Start by drinking small amounts,  about  cup (120 mL) every 5-10 minutes. ? Slowly increase how much you drink until you have taken the amount recommended by your health care provider.  Drink enough clear fluids to keep your urine pale yellow. If you were told to drink an ORS, finish it first, then start slowly drinking other clear fluids. Drink fluids such as: ? Water. This includes sparkling water and flavored water. Drinking only water can lead to having too little sodium in your body (hyponatremia). Follow the advice of your health care provider. ? Water from ice chips you suck on. ? Fruit juice with water you add to it (diluted). ? Sports drinks. ? Hot or cold herbal teas. ? Broth-based soups. ? Milk or milk products. Food Follow instructions from your health care provider about what to eat while you rehydrate. Your health care provider may recommend that you slowly begin eating regular foods in small amounts.  Eat foods that contain a healthy balance of electrolytes, such as bananas, oranges, potatoes, tomatoes, and spinach.  Avoid foods that are greasy or contain a lot of sugar. In some cases, you may get nutrition through a feeding tube that is passed through your nose and into your stomach (nasogastric tube, or NG tube). This may be done if you have uncontrolled vomiting or diarrhea.   Beverages to avoid Certain beverages may make dehydration worse. While you rehydrate, avoid drinking alcohol.   How to tell if you are recovering from dehydration You may be recovering from dehydration if:  You are urinating more often than before you started rehydrating.  Your urine is pale yellow.  Your energy level   improves.  You vomit less frequently.  You have diarrhea less frequently.  Your appetite improves or returns to normal.  You feel less dizzy or less light-headed.  Your skin tone and color start to look more normal. Follow these instructions at home:  Take over-the-counter and prescription medicines only  as told by your health care provider.  Do not take sodium tablets. Doing this can lead to having too much sodium in your body (hypernatremia). Contact a health care provider if:  You continue to have symptoms of mild or moderate dehydration, such as: ? Thirst. ? Dry lips. ? Slightly dry mouth. ? Dizziness. ? Dark urine or less urine than normal. ? Muscle cramps.  You continue to vomit or have diarrhea. Get help right away if you:  Have symptoms of dehydration that get worse.  Have a fever.  Have a severe headache.  Have been vomiting and the following happens: ? Your vomiting gets worse or does not go away. ? Your vomit includes blood or green matter (bile). ? You cannot eat or drink without vomiting.  Have problems with urination or bowel movements, such as: ? Diarrhea that gets worse or does not go away. ? Blood in your stool (feces). This may cause stool to look black and tarry. ? Not urinating, or urinating only a small amount of very dark urine, within 6-8 hours.  Have trouble breathing.  Have symptoms that get worse with treatment. These symptoms may represent a serious problem that is an emergency. Do not wait to see if the symptoms will go away. Get medical help right away. Call your local emergency services (911 in the [place] Do not drive yourself to the hospital. Summary  Rehydration is the replacement of body fluids and minerals (electrolytes) that are lost during dehydration.  Follow instructions from your health care provider for rehydration. The kind of fluid and amount you should drink depend on your condition.  Slowly increase how much you drink until you have taken the amount recommended by your health care provider.  Contact your health care provider if you continue to show signs of mild or moderate dehydration. This information is not intended to replace advice given to you by your health care provider. Make sure you discuss any questions you have with  your health care provider. Document Revised: 09/20/2019 Document Reviewed: 07/31/2019 Elsevier Patient Education  2021 [employer]vier Inc.  

## 2020-10-02 ENCOUNTER — Telehealth: Payer: Self-pay | Admitting: Hematology and Oncology

## 2020-10-02 NOTE — Telephone Encounter (Signed)
Scheduled appointment per 03/01 scheduled message. Attempted to contact patient, left a detailed message.

## 2020-10-03 NOTE — Assessment & Plan Note (Signed)
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 ------------------------------------------------------------------------------------------------------------------------- Current treatment: Cycle 2 dose dense Adriamycin Cytoxan Echocardiogram 09/04/20 : EF 60-65%  Chemo Toxicities: 1. Gastritis: Sent for Prilosec 2. Nausea 3. Fatigue 4. Severe panic attacks: Will add IV Ativan with day 1 of chemo We can consider giving a dose of Ativan IM on day 3 if necessary  Emotional State is very frail and our team of counselors are providing counseling services to her. She was happt to see that her lab values were in good condition, esp her LFTs  Return to clinic in 2 weeks for Cycle 3

## 2020-10-03 NOTE — Progress Notes (Signed)
Patient Care Team: [name], MD as PCP - General (Family Medicine)  DIAGNOSIS:    ICD-10-CM   1. Malignant neoplasm of upper-outer quadrant of left breast in female, estrogen receptor positive ([place])  C50.412    Z17.0     SUMMARY OF ONCOLOGIC HISTORY: Oncology History  Malignant neoplasm of upper-outer quadrant of left breast in female, estrogen receptor positive ([place])  08/21/2020 Initial Diagnosis   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 No pathogenic variants identified on the [facility] Breast Cancer STAT Panel + Multi-Cancer Panel. VUS in [facility] called c.983C>T identified. The report date is 09/04/2020.   The STAT Breast cancer panel offered by [facility] includes sequencing and rearrangement analysis for the following 9 genes:  ATM, BRCA1, BRCA2, CDH1, CHEK2, PALB2, PTEN, STK11 and TP53.    The Multi-Cancer Panel offered by [facility] includes sequencing and/or deletion duplication testing of the following 85 genes: AIP, ALK, APC, ATM, AXIN2,BAP1,  BARD1, BLM, BMPR1A, BRCA1, BRCA2, BRIP1, CASR, CDC73, CDH1, CDK4, CDKN1B, CDKN1C, CDKN2A (p14ARF), CDKN2A (p16INK4a), CEBPA, CHEK2, CTNNA1, DICER1, DIS3L2, EGFR (c.2369C>T, p.Thr790Met variant only), EPCAM (Deletion/duplication testing only), FH, FLCN, GATA2, GPC3, GREM1 (Promoter region deletion/duplication testing only), HOXB13 (c.251G>A, p.Gly84Glu), HRAS, KIT, MAX,  MEN1, MET, MITF (c.952G>A, p.Glu318Lys variant only), MLH1, MSH2, MSH3, MSH6, MUTYH, NBN, NF1, NF2, NTHL1, PALB2, PDGFRA, PHOX2B, PMS2, POLD1, POLE, POT1, PRKAR1A, PTCH1, PTEN, RAD50, RAD51C, RAD51D, RB1, RECQL4, RET, RNF43, RUNX1, SDHAF2, SDHA (sequence changes only), SDHB, SDHC, SDHD, SMAD4, SMARCA4, SMARCB1, SMARCE1, STK11, SUFU, TERC, TERT, TMEM127, TP53, TSC1, TSC2, VHL, WRN and WT1.    09/06/2020 -  Chemotherapy    Patient is on Treatment Plan: BREAST ADJUVANT DOSE DENSE AC Q14D / PACLITAXEL Q7D        CHIEF COMPLIANT: Cycle 3 Day 1 Adriamycin and Cytoxan   INTERVAL HISTORY: Tracy Dyer is a 50 y.o. with above-mentioned history of leftbreast cancer currently on neoadjuvant chemotherapy with dose dense Adriamycin and Cytoxan. She presents to the clinic today for a toxicity check and cycle 3.  ALLERGIES:  is allergic to sulfa antibiotics, hydromorphone, oxycodone, propoxyphene, and sulfamethoxazole.  MEDICATIONS:  Current Outpatient Medications  Medication Sig Dispense Refill  . clonazePAM (KLONOPIN) 1 MG tablet Take 1 tablet (1 mg total) by mouth 2 (two) times daily as needed for anxiety. 60 tablet 1  . dexamethasone (DECADRON) 4 MG tablet Take 1 tablet (4 mg total) by mouth daily. Take 1 tablet day after chemo and 1 tablet 2 days after chemo with food 8 tablet 0  . fluticasone (FLONASE) 50 MCG/ACT nasal spray Place 1 spray into both nostrils daily as needed for allergies.    Marland Kitchen gabapentin (NEURONTIN) 300 MG capsule Take 1 capsule (300 mg total) by mouth at bedtime. 30 capsule 5  . l-methylfolate-B6-B12 (METANX) 3-35-2 MG TABS tablet Take 1 tablet by mouth daily.    Marland Kitchen lidocaine-prilocaine (EMLA) cream Apply to affected area once 30 g 3  .  LORazepam (ATIVAN) 0.5 MG tablet Take 1 tablet (0.5 mg total) by mouth once as needed for up to 1 dose for anxiety. 4 tablet 0  . MAGNESIUM CITRATE PO Take 405 mg by mouth daily. 135 mg    . Multiple Vitamins-Minerals (MULTIVITAMIN WITH  MINERALS) tablet Take 1 tablet by mouth daily.    Marland Kitchen omeprazole (PRILOSEC) 20 MG capsule Take 1 capsule (20 mg total) by mouth daily. 30 capsule 3  . ondansetron (ZOFRAN) 8 MG tablet Take 1 tablet (8 mg total) by mouth 2 (two) times daily as needed. Start on the third day after chemotherapy. 30 tablet 1  . prochlorperazine (COMPAZINE) 10 MG tablet Take 1 tablet (10 mg total) by mouth every 6 (six) hours as needed (Nausea or vomiting). 30 tablet 1  . sertraline (ZOLOFT) 100 MG tablet Take 100 mg by mouth at bedtime.    . traMADol (ULTRAM) 50 MG tablet Take 1 tablet (50 mg total) by mouth every 6 (six) hours as needed. 10 tablet 0   No current facility-administered medications for this visit.    PHYSICAL EXAMINATION: ECOG PERFORMANCE STATUS: 1 - Symptomatic but completely ambulatory  There were no vitals filed for this visit. There were no vitals filed for this visit.  LABORATORY DATA:  I have reviewed the data as listed CMP Latest Ref Rng & Units 09/20/2020 09/13/2020 09/04/2020  Glucose 70 - 99 mg/dL 118(H) 118(H) 114(H)  BUN 6 - 20 mg/dL _0 Creatinine 0.44 - 1.00 mg/dL 0.89 0.73 0.81  Sodium 135 - 145 mmol/L 138 138 141  Potassium 3.5 - 5.1 mmol/L 4.3 4.1 4.4  Chloride 98 - 111 mmol/L 107 108 109  CO2 22 - 32 mmol/L _1 Calcium 8.9 - 10.3 mg/dL 9.1 9.1 8.4(L)  Total Protein 6.5 - 8.1 g/dL 6.8 6.7 6.4(L)  Total Bilirubin 0.3 - 1.2 mg/dL 0.2(L) 0.4 0.3  Alkaline Phos 38 - 126 U/L 96 85 77  AST 15 - 41 U/L 22 13(L) 18  ALT 0 - 44 U/L _2 Lab Results  Component Value Date   WBC 15.2 (H) 10/04/2020   HGB 12.4 10/04/2020   HCT 36.3 10/04/2020   MCV 91.2 10/04/2020   PLT 177 10/04/2020   NEUTROABS PENDING 10/04/2020    ASSESSMENT & PLAN:  Malignant neoplasm of upper-outer quadrant of left breast in female, estrogen receptor positive ([place]) Screening mammogram detected punctate calcifications left breast UOQ: Stable, interval development of mass UOQ left breast  with distortion 1.8 cm (3 cm from nipple), 3 o'clock position 5 cm from nipple 1 cm mass: Biopsy benign, concordant, fibroadenoma, 2 enlarged lymph nodes: Positive, breast biopsy 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 ------------------------------------------------------------------------------------------------------------------------- Current treatment: Cycle 2 dose dense Adriamycin Cytoxan Echocardiogram 09/04/20 : EF 60-65%  Chemo Toxicities: 1. Gastritis: Sent for Prilosec 2. Nausea 3. Fatigue 4. Severe panic attacks: Will add IV Ativan with day 1 of chemo  Emotional State is very frail and our team of counselors are providing counseling services to  her. She was happt to see that her lab values were in good condition, esp her LFTs  Return to clinic in 2 weeks for Cycle 3    No orders of the defined types were placed in this encounter.  The patient has a good understanding of the overall plan. she agrees with it. she will call with any problems that may develop before the next visit here.  Total time spent: 30 mins including face to face time and time spent for planning, charting and coordination of care  Rulon Eisenmenger, MD, MPH 10/04/2020  I, Cloyde Reams Dorshimer, am acting as scribe for Dr. [name].  I have reviewed the above documentation for accuracy and completeness, and I agree with the above.

## 2020-10-04 ENCOUNTER — Inpatient Hospital Stay: Payer: BC Managed Care – PPO

## 2020-10-04 ENCOUNTER — Inpatient Hospital Stay (HOSPITAL_BASED_OUTPATIENT_CLINIC_OR_DEPARTMENT_OTHER): Payer: BC Managed Care – PPO | Admitting: Hematology and Oncology

## 2020-10-04 ENCOUNTER — Encounter: Payer: Self-pay | Admitting: General Practice

## 2020-10-04 ENCOUNTER — Encounter: Payer: Self-pay | Admitting: *Deleted

## 2020-10-04 ENCOUNTER — Inpatient Hospital Stay: Payer: BC Managed Care – PPO | Attending: Hematology and Oncology

## 2020-10-04 ENCOUNTER — Other Ambulatory Visit: Payer: Self-pay

## 2020-10-04 DIAGNOSIS — Z7952 Long term (current) use of systemic steroids: Secondary | ICD-10-CM | POA: Diagnosis not present

## 2020-10-04 DIAGNOSIS — Z5189 Encounter for other specified aftercare: Secondary | ICD-10-CM | POA: Diagnosis not present

## 2020-10-04 DIAGNOSIS — Z79899 Other long term (current) drug therapy: Secondary | ICD-10-CM | POA: Insufficient documentation

## 2020-10-04 DIAGNOSIS — Z170 Estrogen receptor positive status [ER+]: Secondary | ICD-10-CM | POA: Diagnosis not present

## 2020-10-04 DIAGNOSIS — C50412 Malignant neoplasm of upper-outer quadrant of left female breast: Secondary | ICD-10-CM

## 2020-10-04 DIAGNOSIS — Z5111 Encounter for antineoplastic chemotherapy: Secondary | ICD-10-CM | POA: Insufficient documentation

## 2020-10-04 DIAGNOSIS — R5383 Other fatigue: Secondary | ICD-10-CM | POA: Diagnosis not present

## 2020-10-04 DIAGNOSIS — Z95828 Presence of other vascular implants and grafts: Secondary | ICD-10-CM

## 2020-10-04 LAB — COMPREHENSIVE METABOLIC PANEL
ALT: 32 U/L (ref 0–44)
AST: 25 U/L (ref 15–41)
Albumin: 4 g/dL (ref 3.5–5.0)
Alkaline Phosphatase: 119 U/L (ref 38–126)
Anion gap: 8 (ref 5–15)
BUN: 13 mg/dL (ref 6–20)
CO2: 22 mmol/L (ref 22–32)
Calcium: 8.6 mg/dL — ABNORMAL LOW (ref 8.9–10.3)
Chloride: 107 mmol/L (ref 98–111)
Creatinine, Ser: 0.76 mg/dL (ref 0.44–1.00)
GFR, Estimated: >60 mL/min (ref >60)
Glucose, Bld: 125 mg/dL — ABNORMAL HIGH (ref 70–99)
Potassium: 4.1 mmol/L (ref 3.5–5.1)
Sodium: 137 mmol/L (ref 135–145)
Total Bilirubin: 0.2 mg/dL — ABNORMAL LOW (ref 0.3–1.2)
Total Protein: 6.8 g/dL (ref 6.5–8.1)

## 2020-10-04 LAB — CBC WITH DIFFERENTIAL/PLATELET
Abs Immature Granulocytes: 2.67 10*3/uL — ABNORMAL HIGH (ref 0.00–0.07)
Basophils Absolute: 0.3 10*3/uL — ABNORMAL HIGH (ref 0.0–0.1)
Basophils Relative: 2 %
Eosinophils Absolute: 0 10*3/uL (ref 0.0–0.5)
Eosinophils Relative: 0 %
HCT: 36.3 % (ref 36.0–46.0)
Hemoglobin: 12.4 g/dL (ref 12.0–15.0)
Immature Granulocytes: 18 %
Lymphocytes Relative: 11 %
Lymphs Abs: 1.6 10*3/uL (ref 0.7–4.0)
MCH: 31.2 pg (ref 26.0–34.0)
MCHC: 34.2 g/dL (ref 30.0–36.0)
MCV: 91.2 fL (ref 80.0–100.0)
Monocytes Absolute: 1.1 10*3/uL — ABNORMAL HIGH (ref 0.1–1.0)
Monocytes Relative: 7 %
Neutro Abs: 9.5 10*3/uL — ABNORMAL HIGH (ref 1.7–7.7)
Neutrophils Relative %: 62 %
Platelets: 177 10*3/uL (ref 150–400)
RBC: 3.98 MIL/uL (ref 3.87–5.11)
RDW: 14.4 % (ref 11.5–15.5)
WBC: 15.2 10*3/uL — ABNORMAL HIGH (ref 4.0–10.5)
nRBC: 0.3 % — ABNORMAL HIGH (ref 0.0–0.2)

## 2020-10-04 MED ORDER — PALONOSETRON HCL INJECTION 0.25 MG/5ML
INTRAVENOUS | Status: AC
  Filled 2020-10-04: qty 5

## 2020-10-04 MED ORDER — SODIUM CHLORIDE 0.9% FLUSH
10.0000 mL | INTRAVENOUS | Status: DC | PRN
  Filled 2020-10-04: qty 10

## 2020-10-04 MED ORDER — HEPARIN SOD (PORK) LOCK FLUSH 100 UNIT/ML IV SOLN
500.0000 [IU] | Freq: Once | INTRAVENOUS | Status: DC | PRN
  Filled 2020-10-04: qty 5

## 2020-10-04 MED ORDER — LORAZEPAM 2 MG/ML IJ SOLN
1.0000 mg | Freq: Once | INTRAMUSCULAR | Status: AC
  Administered 2020-10-04: 1 mg via INTRAVENOUS

## 2020-10-04 MED ORDER — DOXORUBICIN HCL CHEMO IV INJECTION 2 MG/ML
60.0000 mg/m2 | Freq: Once | INTRAVENOUS | Status: AC
  Administered 2020-10-04: 128 mg via INTRAVENOUS
  Filled 2020-10-04: qty 64

## 2020-10-04 MED ORDER — SODIUM CHLORIDE 0.9 % IV SOLN
Freq: Once | INTRAVENOUS | Status: AC
  Filled 2020-10-04: qty 250

## 2020-10-04 MED ORDER — SODIUM CHLORIDE 0.9 % IV SOLN
150.0000 mg | Freq: Once | INTRAVENOUS | Status: AC
  Administered 2020-10-04: 150 mg via INTRAVENOUS
  Filled 2020-10-04: qty 150

## 2020-10-04 MED ORDER — PALONOSETRON HCL INJECTION 0.25 MG/5ML
0.2500 mg | Freq: Once | INTRAVENOUS | Status: AC
Start: 2020-10-04 — End: 2020-10-04
  Administered 2020-10-04: 0.25 mg via INTRAVENOUS

## 2020-10-04 MED ORDER — SODIUM CHLORIDE 0.9 % IV SOLN
10.0000 mg | Freq: Once | INTRAVENOUS | Status: AC
  Administered 2020-10-04: 10 mg via INTRAVENOUS
  Filled 2020-10-04: qty 10

## 2020-10-04 MED ORDER — LORAZEPAM 2 MG/ML IJ SOLN
INTRAMUSCULAR | Status: AC
  Filled 2020-10-04: qty 1

## 2020-10-04 MED ORDER — SODIUM CHLORIDE 0.9 % IV SOLN
600.0000 mg/m2 | Freq: Once | INTRAVENOUS | Status: AC
  Administered 2020-10-04: 1280 mg via INTRAVENOUS
  Filled 2020-10-04: qty 64

## 2020-10-04 MED ORDER — SODIUM CHLORIDE 0.9% FLUSH
10.0000 mL | Freq: Once | INTRAVENOUS | Status: AC
  Administered 2020-10-04: 10 mL
  Filled 2020-10-04: qty 10

## 2020-10-04 NOTE — Patient Instructions (Signed)
Implanted Port Insertion, Care After This sheet gives you information about how to care for yourself after your procedure. Your health care provider may also give you more specific instructions. If you have problems or questions, contact your health care provider. What can I expect after the procedure? After the procedure, it is common to have:  Discomfort at the port insertion site.  Bruising on the skin over the port. This should improve over 3-4 days. Follow these instructions at home: Port care  After your port is placed, you will get a manufacturer's information card. The card has information about your port. Keep this card with you at all times.  Take care of the port as told by your health care provider. Ask your health care provider if you or a family member can get training for taking care of the port at home. A home health care nurse may also take care of the port.  Make sure to remember what type of port you have. Incision care  Follow instructions from your health care provider about how to take care of your port insertion site. Make sure you: ? Wash your hands with soap and water before and after you change your bandage (dressing). If soap and water are not available, use hand sanitizer. ? Change your dressing as told by your health care provider. ? Leave stitches (sutures), skin glue, or adhesive strips in place. These skin closures may need to stay in place for 2 weeks or longer. If adhesive strip edges start to loosen and curl up, you may trim the loose edges. Do not remove adhesive strips completely unless your health care provider tells you to do that.  Check your port insertion site every day for signs of infection. Check for: ? Redness, swelling, or pain. ? Fluid or blood. ? Warmth. ? Pus or a bad smell.      Activity  Return to your normal activities as told by your health care provider. Ask your health care provider what activities are safe for you.  Do not  lift anything that is heavier than 10 lb (4.5 kg), or the limit that you are told, until your health care provider says that it is safe. General instructions  Take over-the-counter and prescription medicines only as told by your health care provider.  Do not take baths, swim, or use a hot tub until your health care provider approves. Ask your health care provider if you may take showers. You may only be allowed to take sponge baths.  Do not drive for 24 hours if you were given a sedative during your procedure.  Wear a medical alert bracelet in case of an emergency. This will tell any health care providers that you have a port.  Keep all follow-up visits as told by your health care provider. This is important. Contact a health care provider if:  You cannot flush your port with saline as directed, or you cannot draw blood from the port.  You have a fever or chills.  You have redness, swelling, or pain around your port insertion site.  You have fluid or blood coming from your port insertion site.  Your port insertion site feels warm to the touch.  You have pus or a bad smell coming from the port insertion site. Get help right away if:  You have chest pain or shortness of breath.  You have bleeding from your port that you cannot control. Summary  Take care of the port as told by your   health care provider. Keep the manufacturer's information card with you at all times.  Change your dressing as told by your health care provider.  Contact a health care provider if you have a fever or chills or if you have redness, swelling, or pain around your port insertion site.  Keep all follow-up visits as told by your health care provider. This information is not intended to replace advice given to you by your health care provider. Make sure you discuss any questions you have with your health care provider. Document Revised: 02/15/2018 Document Reviewed: 02/15/2018 Elsevier Patient Education   2021 [employer]  

## 2020-10-04 NOTE — Patient Instructions (Signed)
[facility] Discharge Instructions for Patients Receiving Chemotherapy  Today you received the following chemotherapy agents Doxorubicin(Adraumycin), Cyclophosphamide(Cytoxan).  To help prevent nausea and vomiting after your treatment, we encourage you to take your nausea medication as directed.   If you develop nausea and vomiting that is not controlled by your nausea medication, call the clinic.   BELOW ARE SYMPTOMS THAT SHOULD BE REPORTED IMMEDIATELY:  *FEVER GREATER THAN 100.5 F  *CHILLS WITH OR WITHOUT FEVER  NAUSEA AND VOMITING THAT IS NOT CONTROLLED WITH YOUR NAUSEA MEDICATION  *UNUSUAL SHORTNESS OF BREATH  *UNUSUAL BRUISING OR BLEEDING  TENDERNESS IN MOUTH AND THROAT WITH OR WITHOUT PRESENCE OF ULCERS  *URINARY PROBLEMS  *BOWEL PROBLEMS  UNUSUAL RASH Items with * indicate a potential emergency and should be followed up as soon as possible.  Feel free to call the clinic should you have any questions or concerns. The clinic phone number is [phone].  Please show the [place] at check-in to the Emergency Department and triage nurse.

## 2020-10-04 NOTE — Progress Notes (Signed)
[facility] Spiritual Care Note  Followed up with Tracy Dyer in infusion, providing opportunity for her to process and reflect on her first couple of chemo treatments. She has identified many extra stressors associated with her first treatment that aren't present for her subsequent treatments, which is helping her feel more able to tackle this one. She also knows now to anticipate feeling bad for days 2-3 but then experiencing an upswing again; knowing that symptoms will be temporary also helps. This perspective, support from family/friends, and time in nature are all key coping tools. We plan to follow up at her next treatment, and she knows to reach out in the meantime as needed/desired.   [place], [place], [facility] Pager [phone] Voicemail [phone]

## 2020-10-07 ENCOUNTER — Inpatient Hospital Stay: Payer: BC Managed Care – PPO | Admitting: Nutrition

## 2020-10-07 ENCOUNTER — Inpatient Hospital Stay: Payer: BC Managed Care – PPO

## 2020-10-07 ENCOUNTER — Other Ambulatory Visit: Payer: Self-pay

## 2020-10-07 ENCOUNTER — Encounter: Payer: Self-pay | Admitting: Nutrition

## 2020-10-07 VITALS — BP 108/54 | HR 73 | Temp 98.2°F | Resp 18

## 2020-10-07 DIAGNOSIS — R5383 Other fatigue: Secondary | ICD-10-CM | POA: Diagnosis not present

## 2020-10-07 DIAGNOSIS — Z170 Estrogen receptor positive status [ER+]: Secondary | ICD-10-CM

## 2020-10-07 DIAGNOSIS — Z5111 Encounter for antineoplastic chemotherapy: Secondary | ICD-10-CM | POA: Diagnosis not present

## 2020-10-07 DIAGNOSIS — Z5189 Encounter for other specified aftercare: Secondary | ICD-10-CM | POA: Diagnosis not present

## 2020-10-07 DIAGNOSIS — Z79899 Other long term (current) drug therapy: Secondary | ICD-10-CM | POA: Diagnosis not present

## 2020-10-07 DIAGNOSIS — C50412 Malignant neoplasm of upper-outer quadrant of left female breast: Secondary | ICD-10-CM | POA: Diagnosis not present

## 2020-10-07 DIAGNOSIS — Z7952 Long term (current) use of systemic steroids: Secondary | ICD-10-CM | POA: Diagnosis not present

## 2020-10-07 MED ORDER — PEGFILGRASTIM-JMDB 6 MG/0.6ML ~~LOC~~ SOSY
PREFILLED_SYRINGE | SUBCUTANEOUS | Status: AC
  Filled 2020-10-07: qty 0.6

## 2020-10-07 MED ORDER — PEGFILGRASTIM-JMDB 6 MG/0.6ML ~~LOC~~ SOSY
6.0000 mg | PREFILLED_SYRINGE | Freq: Once | SUBCUTANEOUS | Status: AC
  Administered 2020-10-07: 6 mg via SUBCUTANEOUS

## 2020-10-07 NOTE — Progress Notes (Signed)
Patient cancelled nutrition appointment and did not want to reschedule.

## 2020-10-07 NOTE — Patient Instructions (Signed)
Pegfilgrastim injection What is this medicine? PEGFILGRASTIM (PEG fil gra stim) is a long-acting granulocyte colony-stimulating factor that stimulates the growth of neutrophils, a type of white blood cell important in the body's fight against infection. It is used to reduce the incidence of fever and infection in patients with certain types of cancer who are receiving chemotherapy that affects the bone marrow, and to increase survival after being exposed to high doses of radiation. This medicine may be used for other purposes; ask your health care provider or pharmacist if you have questions. COMMON BRAND NAME(S): Fulphila, Neulasta, Nyvepria, UDENYCA, Ziextenzo What should I tell my health care provider before I take this medicine? They need to know if you have any of these conditions:  kidney disease  latex allergy  ongoing radiation therapy  sickle cell disease  skin reactions to acrylic adhesives (On-Body Injector only)  an unusual or allergic reaction to pegfilgrastim, filgrastim, other medicines, foods, dyes, or preservatives  pregnant or trying to get pregnant  breast-feeding How should I use this medicine? This medicine is for injection under the skin. If you get this medicine at home, you will be taught how to prepare and give the pre-filled syringe or how to use the On-body Injector. Refer to the patient Instructions for Use for detailed instructions. Use exactly as directed. Tell your healthcare provider immediately if you suspect that the On-body Injector may not have performed as intended or if you suspect the use of the On-body Injector resulted in a missed or partial dose. It is important that you put your used needles and syringes in a special sharps container. Do not put them in a trash can. If you do not have a sharps container, call your pharmacist or healthcare provider to get one. Talk to your pediatrician regarding the use of this medicine in children. While this drug  may be prescribed for selected conditions, precautions do apply. Overdosage: If you think you have taken too much of this medicine contact a poison control center or emergency room at once. NOTE: This medicine is only for you. Do not share this medicine with others. What if I miss a dose? It is important not to miss your dose. Call your doctor or health care professional if you miss your dose. If you miss a dose due to an On-body Injector failure or leakage, a new dose should be administered as soon as possible using a single prefilled syringe for manual use. What may interact with this medicine? Interactions have not been studied. This list may not describe all possible interactions. Give your health care provider a list of all the medicines, herbs, non-prescription drugs, or dietary supplements you use. Also tell them if you smoke, drink alcohol, or use illegal drugs. Some items may interact with your medicine. What should I watch for while using this medicine? Your condition will be monitored carefully while you are receiving this medicine. You may need blood work done while you are taking this medicine. Talk to your health care provider about your risk of cancer. You may be more at risk for certain types of cancer if you take this medicine. If you are going to need a MRI, CT scan, or other procedure, tell your doctor that you are using this medicine (On-Body Injector only). What side effects may I notice from receiving this medicine? Side effects that you should report to your doctor or health care professional as soon as possible:  allergic reactions (skin rash, itching or hives, swelling of   the face, lips, or tongue)  back pain  dizziness  fever  pain, redness, or irritation at site where injected  pinpoint red spots on the skin  red or dark-brown urine  shortness of breath or breathing problems  stomach or side pain, or pain at the shoulder  swelling  tiredness  trouble  passing urine or change in the amount of urine  unusual bruising or bleeding Side effects that usually do not require medical attention (report to your doctor or health care professional if they continue or are bothersome):  bone pain  muscle pain This list may not describe all possible side effects. Call your doctor for medical advice about side effects. You may report side effects to FDA at 1-800-FDA-1088. Where should I keep my medicine? Keep out of the reach of children. If you are using this medicine at home, you will be instructed on how to store it. Throw away any unused medicine after the expiration date on the label. NOTE: This sheet is a summary. It may not cover all possible information. If you have questions about this medicine, talk to your doctor, pharmacist, or health care provider.  2021 Elsevier/Gold Standard (2019-08-11 13:20:51)  

## 2020-10-08 ENCOUNTER — Other Ambulatory Visit: Payer: Self-pay | Admitting: Hematology and Oncology

## 2020-10-08 ENCOUNTER — Telehealth: Payer: Self-pay | Admitting: Hematology and Oncology

## 2020-10-08 DIAGNOSIS — Z170 Estrogen receptor positive status [ER+]: Secondary | ICD-10-CM

## 2020-10-08 DIAGNOSIS — C50412 Malignant neoplasm of upper-outer quadrant of left female breast: Secondary | ICD-10-CM

## 2020-10-08 NOTE — Telephone Encounter (Signed)
Scheduled per 3/4 los. Pt will receive an updated appt calendar per next visit appt notes

## 2020-10-09 ENCOUNTER — Encounter: Payer: Self-pay | Admitting: Hematology and Oncology

## 2020-10-09 ENCOUNTER — Encounter: Payer: Self-pay | Admitting: *Deleted

## 2020-10-14 DIAGNOSIS — R5383 Other fatigue: Secondary | ICD-10-CM | POA: Diagnosis not present

## 2020-10-14 DIAGNOSIS — C50919 Malignant neoplasm of unspecified site of unspecified female breast: Secondary | ICD-10-CM | POA: Diagnosis not present

## 2020-10-14 DIAGNOSIS — R790 Abnormal level of blood mineral: Secondary | ICD-10-CM | POA: Diagnosis not present

## 2020-10-14 DIAGNOSIS — E559 Vitamin D deficiency, unspecified: Secondary | ICD-10-CM | POA: Diagnosis not present

## 2020-10-17 NOTE — Progress Notes (Signed)
Patient Care Team: [name], MD as PCP - General (Family Medicine)  DIAGNOSIS:    ICD-10-CM   1. Malignant neoplasm of upper-outer quadrant of left breast in female, estrogen receptor positive ([place])  C50.412    Z17.0     SUMMARY OF ONCOLOGIC HISTORY: Oncology History  Malignant neoplasm of upper-outer quadrant of left breast in female, estrogen receptor positive ([place])  08/21/2020 Initial Diagnosis   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 No pathogenic variants identified on the [facility] Breast Cancer STAT Panel + Multi-Cancer Panel. VUS in [facility] called c.983C>T identified. The report date is 09/04/2020.   The STAT Breast cancer panel offered by [facility] includes sequencing and rearrangement analysis for the following 9 genes:  ATM, BRCA1, BRCA2, CDH1, CHEK2, PALB2, PTEN, STK11 and TP53.    The Multi-Cancer Panel offered by [facility] includes sequencing and/or deletion duplication testing of the following 85 genes: AIP, ALK, APC, ATM, AXIN2,BAP1,  BARD1, BLM, BMPR1A, BRCA1, BRCA2, BRIP1, CASR, CDC73, CDH1, CDK4, CDKN1B, CDKN1C, CDKN2A (p14ARF), CDKN2A (p16INK4a), CEBPA, CHEK2, CTNNA1, DICER1, DIS3L2, EGFR (c.2369C>T, p.Thr790Met variant only), EPCAM (Deletion/duplication testing only), FH, FLCN, GATA2, GPC3, GREM1 (Promoter region deletion/duplication testing only), HOXB13 (c.251G>A, p.Gly84Glu), HRAS, KIT, MAX,  MEN1, MET, MITF (c.952G>A, p.Glu318Lys variant only), MLH1, MSH2, MSH3, MSH6, MUTYH, NBN, NF1, NF2, NTHL1, PALB2, PDGFRA, PHOX2B, PMS2, POLD1, POLE, POT1, PRKAR1A, PTCH1, PTEN, RAD50, RAD51C, RAD51D, RB1, RECQL4, RET, RNF43, RUNX1, SDHAF2, SDHA (sequence changes only), SDHB, SDHC, SDHD, SMAD4, SMARCA4, SMARCB1, SMARCE1, STK11, SUFU, TERC, TERT, TMEM127, TP53, TSC1, TSC2, VHL, WRN and WT1.    09/06/2020 -  Chemotherapy    Patient is on Treatment Plan: BREAST ADJUVANT DOSE DENSE AC Q14D / PACLITAXEL Q7D        CHIEF COMPLIANT: Cycle 4 Day 1Adriamycin and Cytoxan  INTERVAL HISTORY: Tracy Dyer is a 50 y.o. with above-mentioned history of leftbreast cancer currently onneoadjuvant chemotherapy with dose dense Adriamycin and Cytoxan.She presents to the clinic todayfor a toxicity check and cycle 4.  Skin of her hands has become very sensitive and it is causing itching and slight neuropathy of the tips of the fingers.  The fatigue has gotten markedly worse where she is completely tired the entire 2 weeks.  She did not have any nausea or vomiting.  ALLERGIES:  is allergic to sulfa antibiotics, hydromorphone, oxycodone, propoxyphene, and sulfamethoxazole.  MEDICATIONS:  Current Outpatient Medications  Medication Sig Dispense Refill  . clonazePAM (KLONOPIN) 1 MG tablet Take 1 tablet (1 mg total) by mouth 2 (two) times daily as needed for anxiety. 60 tablet 1  . dexamethasone (DECADRON) 4 MG tablet TAKE 1 TABLET (4 MG TOTAL) BY MOUTH DAILY. TAKE 1 TABLET DAY AFTER CHEMO AND 1 TABLET 2 DAYS AFTER CHEMO WITH FOOD 8 tablet 0  . fluticasone (FLONASE) 50 MCG/ACT nasal spray Place 1 spray into both nostrils daily as needed for allergies.    Marland Kitchen gabapentin (NEURONTIN) 300 MG capsule  Take 1 capsule (300 mg total) by mouth at bedtime. 30 capsule 5  . l-methylfolate-B6-B12 (METANX) 3-35-2 MG TABS tablet Take 1 tablet by mouth daily.    Marland Kitchen lidocaine-prilocaine (EMLA) cream Apply to affected area once 30  g 3  . LORazepam (ATIVAN) 0.5 MG tablet Take 1 tablet (0.5 mg total) by mouth once as needed for up to 1 dose for anxiety. 4 tablet 0  . MAGNESIUM CITRATE PO Take 405 mg by mouth daily. 135 mg    . Multiple Vitamins-Minerals (MULTIVITAMIN WITH MINERALS) tablet Take 1 tablet by mouth daily.    Marland Kitchen omeprazole (PRILOSEC) 20 MG capsule Take 1 capsule (20 mg total) by mouth daily. 30 capsule 3  . ondansetron (ZOFRAN) 8 MG tablet Take 1 tablet (8 mg total) by mouth 2 (two) times daily as needed. Start on the third day after chemotherapy. 30 tablet 1  . prochlorperazine (COMPAZINE) 10 MG tablet Take 1 tablet (10 mg total) by mouth every 6 (six) hours as needed (Nausea or vomiting). 30 tablet 1  . sertraline (ZOLOFT) 100 MG tablet Take 100 mg by mouth at bedtime.    . traMADol (ULTRAM) 50 MG tablet Take 1 tablet (50 mg total) by mouth every 6 (six) hours as needed. 10 tablet 0   No current facility-administered medications for this visit.    PHYSICAL EXAMINATION: ECOG PERFORMANCE STATUS: 2 - Symptomatic, <50% confined to bed  There were no vitals filed for this visit. There were no vitals filed for this visit.  LABORATORY DATA:  I have reviewed the data as listed CMP Latest Ref Rng & Units 10/04/2020 09/20/2020 09/13/2020  Glucose 70 - 99 mg/dL 125(H) 118(H) 118(H)  BUN 6 - 20 mg/dL 13 9 15   Creatinine 0.44 - 1.00 mg/dL 0.76 0.89 0.73  Sodium 135 - 145 mmol/L 137 138 138  Potassium 3.5 - 5.1 mmol/L 4.1 4.3 4.1  Chloride 98 - 111 mmol/L 107 107 108  CO2 22 - 32 mmol/L 22 24 24   Calcium 8.9 - 10.3 mg/dL 8.6(L) 9.1 9.1  Total Protein 6.5 - 8.1 g/dL 6.8 6.8 6.7  Total Bilirubin 0.3 - 1.2 mg/dL 0.2(L) 0.2(L) 0.4  Alkaline Phos 38 - 126 U/L 119 96 85  AST 15 - 41 U/L 25 22 13(L)  ALT 0 - 44 U/L 32 21 14    Lab Results  Component Value Date   WBC 15.2 (H) 10/04/2020   HGB 12.4 10/04/2020   HCT 36.3 10/04/2020   MCV 91.2 10/04/2020   PLT 177 10/04/2020   NEUTROABS 9.5 (H) 10/04/2020     ASSESSMENT & PLAN:  Malignant neoplasm of upper-outer quadrant of left breast in female, estrogen receptor positive ([place]) 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 ------------------------------------------------------------------------------------------------------------------------- Current treatment: Cycle 4 dose dense Adriamycin Cytoxan  Echocardiogram2/2/22: EF 60-65%  Chemo Toxicities: 1. Gastritis: Improved with Prilosec. 2. Nausea: No major issues with nausea.  She does take Compazine at bedtime. 3. Fatigue: Very severe and lasting the entire 2 weeks. 4. Severe panic attacks: Will add IV Ativan with day 1 of chemo 5.  Discomfort of the palms of her hands: I will reduce the dosage of both Adriamycin and Cytoxan today.  Emotionally she is doing a lot better    Return to clinic in 2 weeks forCycle 1 Taxol    No orders of the defined types were placed in this encounter.  The patient has a good understanding of the overall plan. she agrees with it. she will call with any problems that may develop before the next visit here.  Total time spent: 30 mins  including face to face time and time spent for planning, charting and coordination of care  Rulon Eisenmenger, MD, MPH 10/18/2020  I, Molly Dorshimer, am acting as scribe for Dr. [name].  I have reviewed the above documentation for accuracy and completeness, and I agree with the above.

## 2020-10-18 ENCOUNTER — Encounter: Payer: Self-pay | Admitting: General Practice

## 2020-10-18 ENCOUNTER — Inpatient Hospital Stay: Payer: BC Managed Care – PPO

## 2020-10-18 ENCOUNTER — Inpatient Hospital Stay (HOSPITAL_BASED_OUTPATIENT_CLINIC_OR_DEPARTMENT_OTHER): Payer: BC Managed Care – PPO | Admitting: Hematology and Oncology

## 2020-10-18 ENCOUNTER — Other Ambulatory Visit: Payer: Self-pay

## 2020-10-18 DIAGNOSIS — R5383 Other fatigue: Secondary | ICD-10-CM | POA: Diagnosis not present

## 2020-10-18 DIAGNOSIS — C50412 Malignant neoplasm of upper-outer quadrant of left female breast: Secondary | ICD-10-CM | POA: Diagnosis not present

## 2020-10-18 DIAGNOSIS — Z79899 Other long term (current) drug therapy: Secondary | ICD-10-CM | POA: Diagnosis not present

## 2020-10-18 DIAGNOSIS — Z5189 Encounter for other specified aftercare: Secondary | ICD-10-CM | POA: Diagnosis not present

## 2020-10-18 DIAGNOSIS — Z7952 Long term (current) use of systemic steroids: Secondary | ICD-10-CM | POA: Diagnosis not present

## 2020-10-18 DIAGNOSIS — Z170 Estrogen receptor positive status [ER+]: Secondary | ICD-10-CM

## 2020-10-18 DIAGNOSIS — Z5111 Encounter for antineoplastic chemotherapy: Secondary | ICD-10-CM | POA: Diagnosis not present

## 2020-10-18 DIAGNOSIS — Z95828 Presence of other vascular implants and grafts: Secondary | ICD-10-CM

## 2020-10-18 LAB — COMPREHENSIVE METABOLIC PANEL
ALT: 27 U/L (ref 0–44)
AST: 21 U/L (ref 15–41)
Albumin: 3.7 g/dL (ref 3.5–5.0)
Alkaline Phosphatase: 127 U/L — ABNORMAL HIGH (ref 38–126)
Anion gap: 8 (ref 5–15)
BUN: 11 mg/dL (ref 6–20)
CO2: 23 mmol/L (ref 22–32)
Calcium: 8.9 mg/dL (ref 8.9–10.3)
Chloride: 109 mmol/L (ref 98–111)
Creatinine, Ser: 0.76 mg/dL (ref 0.44–1.00)
GFR, Estimated: >60 mL/min (ref >60)
Glucose, Bld: 110 mg/dL — ABNORMAL HIGH (ref 70–99)
Potassium: 4.1 mmol/L (ref 3.5–5.1)
Sodium: 140 mmol/L (ref 135–145)
Total Bilirubin: 0.3 mg/dL (ref 0.3–1.2)
Total Protein: 6.5 g/dL (ref 6.5–8.1)

## 2020-10-18 LAB — CBC WITH DIFFERENTIAL/PLATELET
Abs Immature Granulocytes: 1.4 10*3/uL — ABNORMAL HIGH (ref 0.00–0.07)
Band Neutrophils: 9 %
Basophils Absolute: 0 10*3/uL (ref 0.0–0.1)
Basophils Relative: 0 %
Eosinophils Absolute: 0 10*3/uL (ref 0.0–0.5)
Eosinophils Relative: 0 %
HCT: 33.6 % — ABNORMAL LOW (ref 36.0–46.0)
Hemoglobin: 11.6 g/dL — ABNORMAL LOW (ref 12.0–15.0)
Lymphocytes Relative: 11 %
Lymphs Abs: 1.7 10*3/uL (ref 0.7–4.0)
MCH: 31.6 pg (ref 26.0–34.0)
MCHC: 34.5 g/dL (ref 30.0–36.0)
MCV: 91.6 fL (ref 80.0–100.0)
Metamyelocytes Relative: 9 %
Monocytes Absolute: 0.3 10*3/uL (ref 0.1–1.0)
Monocytes Relative: 2 %
Neutro Abs: 12.4 10*3/uL — ABNORMAL HIGH (ref 1.7–7.7)
Neutrophils Relative %: 69 %
Platelets: 185 10*3/uL (ref 150–400)
RBC: 3.67 MIL/uL — ABNORMAL LOW (ref 3.87–5.11)
RDW: 16.4 % — ABNORMAL HIGH (ref 11.5–15.5)
WBC: 15.9 10*3/uL — ABNORMAL HIGH (ref 4.0–10.5)
nRBC: 0.3 % — ABNORMAL HIGH (ref 0.0–0.2)

## 2020-10-18 MED ORDER — PALONOSETRON HCL INJECTION 0.25 MG/5ML
INTRAVENOUS | Status: AC
  Filled 2020-10-18: qty 5

## 2020-10-18 MED ORDER — SODIUM CHLORIDE 0.9 % IV SOLN
10.0000 mg | Freq: Once | INTRAVENOUS | Status: AC
  Administered 2020-10-18: 10 mg via INTRAVENOUS
  Filled 2020-10-18: qty 10

## 2020-10-18 MED ORDER — LORAZEPAM 2 MG/ML IJ SOLN
1.0000 mg | Freq: Once | INTRAMUSCULAR | Status: AC
  Administered 2020-10-18: 1 mg via INTRAVENOUS

## 2020-10-18 MED ORDER — SODIUM CHLORIDE 0.9 % IV SOLN
400.0000 mg/m2 | Freq: Once | INTRAVENOUS | Status: AC
  Administered 2020-10-18: 840 mg via INTRAVENOUS
  Filled 2020-10-18: qty 42

## 2020-10-18 MED ORDER — HEPARIN SOD (PORK) LOCK FLUSH 100 UNIT/ML IV SOLN
500.0000 [IU] | Freq: Once | INTRAVENOUS | Status: AC | PRN
  Administered 2020-10-18: 500 [IU]
  Filled 2020-10-18: qty 5

## 2020-10-18 MED ORDER — SODIUM CHLORIDE 0.9 % IV SOLN
Freq: Once | INTRAVENOUS | Status: AC
  Filled 2020-10-18: qty 250

## 2020-10-18 MED ORDER — SODIUM CHLORIDE 0.9 % IV SOLN
150.0000 mg | Freq: Once | INTRAVENOUS | Status: AC
  Administered 2020-10-18: 150 mg via INTRAVENOUS
  Filled 2020-10-18: qty 150

## 2020-10-18 MED ORDER — SODIUM CHLORIDE 0.9% FLUSH
10.0000 mL | Freq: Once | INTRAVENOUS | Status: AC
  Administered 2020-10-18: 10 mL
  Filled 2020-10-18: qty 10

## 2020-10-18 MED ORDER — LORAZEPAM 2 MG/ML IJ SOLN
INTRAMUSCULAR | Status: AC
  Filled 2020-10-18: qty 1

## 2020-10-18 MED ORDER — PALONOSETRON HCL INJECTION 0.25 MG/5ML
0.2500 mg | Freq: Once | INTRAVENOUS | Status: AC
  Administered 2020-10-18: 0.25 mg via INTRAVENOUS

## 2020-10-18 MED ORDER — SODIUM CHLORIDE 0.9% FLUSH
10.0000 mL | INTRAVENOUS | Status: DC | PRN
  Administered 2020-10-18: 10 mL
  Filled 2020-10-18: qty 10

## 2020-10-18 MED ORDER — DOXORUBICIN HCL CHEMO IV INJECTION 2 MG/ML
40.0000 mg/m2 | Freq: Once | INTRAVENOUS | Status: AC
Start: 2020-10-18 — End: 2020-10-18
  Administered 2020-10-18: 84 mg via INTRAVENOUS
  Filled 2020-10-18: qty 42

## 2020-10-18 NOTE — Patient Instructions (Signed)

## 2020-10-18 NOTE — Assessment & Plan Note (Signed)
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 ------------------------------------------------------------------------------------------------------------------------- Current treatment: Cycle 3 dose dense Adriamycin Cytoxan Echocardiogram2/2/22: EF 60-65%  Chemo Toxicities: 1. Gastritis: Sent for Prilosec 2. Nausea 3. Fatigue 4. Severe panic attacks: Will add IV Ativan with day 1 of chemo  Emotional State is very frail and our team of counselors are providing counseling services to her. She was happt to see that her lab values were in good condition, esp her LFTs  Return to clinic in 2 weeks forCycle 4

## 2020-10-18 NOTE — Patient Instructions (Signed)
[facility] Discharge Instructions for Patients Receiving Chemotherapy  Today you received the following chemotherapy agents Doxorubicin(Adraumycin), Cyclophosphamide(Cytoxan).  To help prevent nausea and vomiting after your treatment, we encourage you to take your nausea medication as directed.   If you develop nausea and vomiting that is not controlled by your nausea medication, call the clinic.   BELOW ARE SYMPTOMS THAT SHOULD BE REPORTED IMMEDIATELY:  *FEVER GREATER THAN 100.5 F  *CHILLS WITH OR WITHOUT FEVER  NAUSEA AND VOMITING THAT IS NOT CONTROLLED WITH YOUR NAUSEA MEDICATION  *UNUSUAL SHORTNESS OF BREATH  *UNUSUAL BRUISING OR BLEEDING  TENDERNESS IN MOUTH AND THROAT WITH OR WITHOUT PRESENCE OF ULCERS  *URINARY PROBLEMS  *BOWEL PROBLEMS  UNUSUAL RASH Items with * indicate a potential emergency and should be followed up as soon as possible.  Feel free to call the clinic should you have any questions or concerns. The clinic phone number is [phone].  Please show the [place] at check-in to the Emergency Department and triage nurse.

## 2020-10-18 NOTE — Progress Notes (Signed)
[facility] Spiritual Care Note  Followed up with Tracy Dyer in infusion, providing opportunity for her to process her struggle with the cumulative effects of "red devil" treatment, as well as areas in which she is using perspective and attitude to cope. The goal of being restored to health is motivating, even as she dreads the side effects of this fourth dose. We plan to follow up at her next treatment, and she knows to reach out in the interim as needed/desired.   [place], [place], [facility] Pager [phone] Voicemail [phone]

## 2020-11-01 ENCOUNTER — Inpatient Hospital Stay: Payer: BC Managed Care – PPO | Admitting: Medical

## 2020-11-01 ENCOUNTER — Other Ambulatory Visit: Payer: Self-pay

## 2020-11-01 ENCOUNTER — Inpatient Hospital Stay: Payer: BC Managed Care – PPO

## 2020-11-01 ENCOUNTER — Inpatient Hospital Stay: Payer: BC Managed Care – PPO | Attending: Hematology and Oncology

## 2020-11-01 ENCOUNTER — Other Ambulatory Visit: Payer: Self-pay | Admitting: Hematology and Oncology

## 2020-11-01 VITALS — BP 105/52 | HR 77 | Temp 98.1°F | Resp 20 | Ht 66.0 in | Wt 204.0 lb

## 2020-11-01 VITALS — BP 123/63 | HR 69 | Temp 98.1°F | Resp 18

## 2020-11-01 DIAGNOSIS — Z170 Estrogen receptor positive status [ER+]: Secondary | ICD-10-CM

## 2020-11-01 DIAGNOSIS — C50412 Malignant neoplasm of upper-outer quadrant of left female breast: Secondary | ICD-10-CM

## 2020-11-01 DIAGNOSIS — D701 Agranulocytosis secondary to cancer chemotherapy: Secondary | ICD-10-CM | POA: Diagnosis not present

## 2020-11-01 DIAGNOSIS — Z801 Family history of malignant neoplasm of trachea, bronchus and lung: Secondary | ICD-10-CM | POA: Diagnosis not present

## 2020-11-01 DIAGNOSIS — G629 Polyneuropathy, unspecified: Secondary | ICD-10-CM

## 2020-11-01 DIAGNOSIS — R5383 Other fatigue: Secondary | ICD-10-CM | POA: Insufficient documentation

## 2020-11-01 DIAGNOSIS — T451X5A Adverse effect of antineoplastic and immunosuppressive drugs, initial encounter: Secondary | ICD-10-CM | POA: Insufficient documentation

## 2020-11-01 DIAGNOSIS — Z5111 Encounter for antineoplastic chemotherapy: Secondary | ICD-10-CM | POA: Diagnosis not present

## 2020-11-01 DIAGNOSIS — Z79899 Other long term (current) drug therapy: Secondary | ICD-10-CM | POA: Diagnosis not present

## 2020-11-01 DIAGNOSIS — Z8041 Family history of malignant neoplasm of ovary: Secondary | ICD-10-CM | POA: Diagnosis not present

## 2020-11-01 DIAGNOSIS — R110 Nausea: Secondary | ICD-10-CM

## 2020-11-01 DIAGNOSIS — R059 Cough, unspecified: Secondary | ICD-10-CM

## 2020-11-01 DIAGNOSIS — I951 Orthostatic hypotension: Secondary | ICD-10-CM | POA: Diagnosis not present

## 2020-11-01 DIAGNOSIS — E860 Dehydration: Secondary | ICD-10-CM | POA: Diagnosis not present

## 2020-11-01 DIAGNOSIS — Z803 Family history of malignant neoplasm of breast: Secondary | ICD-10-CM | POA: Diagnosis not present

## 2020-11-01 DIAGNOSIS — Z95828 Presence of other vascular implants and grafts: Secondary | ICD-10-CM

## 2020-11-01 LAB — COMPREHENSIVE METABOLIC PANEL
ALT: 22 U/L (ref 0–44)
AST: 20 U/L (ref 15–41)
Albumin: 3.6 g/dL (ref 3.5–5.0)
Alkaline Phosphatase: 93 U/L (ref 38–126)
Anion gap: 10 (ref 5–15)
BUN: 11 mg/dL (ref 6–20)
CO2: 23 mmol/L (ref 22–32)
Calcium: 8.4 mg/dL — ABNORMAL LOW (ref 8.9–10.3)
Chloride: 108 mmol/L (ref 98–111)
Creatinine, Ser: 0.71 mg/dL (ref 0.44–1.00)
GFR, Estimated: >60 mL/min (ref >60)
Glucose, Bld: 107 mg/dL — ABNORMAL HIGH (ref 70–99)
Potassium: 4.1 mmol/L (ref 3.5–5.1)
Sodium: 141 mmol/L (ref 135–145)
Total Bilirubin: 0.4 mg/dL (ref 0.3–1.2)
Total Protein: 6.4 g/dL — ABNORMAL LOW (ref 6.5–8.1)

## 2020-11-01 LAB — CBC WITH DIFFERENTIAL/PLATELET
Abs Immature Granulocytes: 0.01 10*3/uL (ref 0.00–0.07)
Basophils Absolute: 0.1 10*3/uL (ref 0.0–0.1)
Basophils Relative: 3 %
Eosinophils Absolute: 0.1 10*3/uL (ref 0.0–0.5)
Eosinophils Relative: 3 %
HCT: 32.1 % — ABNORMAL LOW (ref 36.0–46.0)
Hemoglobin: 11 g/dL — ABNORMAL LOW (ref 12.0–15.0)
Immature Granulocytes: 0 %
Lymphocytes Relative: 19 %
Lymphs Abs: 0.4 10*3/uL — ABNORMAL LOW (ref 0.7–4.0)
MCH: 31.5 pg (ref 26.0–34.0)
MCHC: 34.3 g/dL (ref 30.0–36.0)
MCV: 92 fL (ref 80.0–100.0)
Monocytes Absolute: 0.5 10*3/uL (ref 0.1–1.0)
Monocytes Relative: 21 %
Neutro Abs: 1.3 10*3/uL — ABNORMAL LOW (ref 1.7–7.7)
Neutrophils Relative %: 54 %
Platelets: 210 10*3/uL (ref 150–400)
RBC: 3.49 MIL/uL — ABNORMAL LOW (ref 3.87–5.11)
RDW: 18.5 % — ABNORMAL HIGH (ref 11.5–15.5)
WBC: 2.4 10*3/uL — ABNORMAL LOW (ref 4.0–10.5)
nRBC: 0 % (ref 0.0–0.2)

## 2020-11-01 MED ORDER — SODIUM CHLORIDE 0.9 % IV SOLN
Freq: Once | INTRAVENOUS | Status: AC
  Filled 2020-11-01: qty 250

## 2020-11-01 MED ORDER — SODIUM CHLORIDE 0.9% FLUSH
10.0000 mL | INTRAVENOUS | Status: DC | PRN
  Administered 2020-11-01: 10 mL
  Filled 2020-11-01: qty 10

## 2020-11-01 MED ORDER — FAMOTIDINE IN NACL 20-0.9 MG/50ML-% IV SOLN
20.0000 mg | Freq: Once | INTRAVENOUS | Status: AC
  Administered 2020-11-01: 20 mg via INTRAVENOUS

## 2020-11-01 MED ORDER — SODIUM CHLORIDE 0.9% FLUSH
10.0000 mL | Freq: Once | INTRAVENOUS | Status: AC
  Administered 2020-11-01: 10 mL
  Filled 2020-11-01: qty 10

## 2020-11-01 MED ORDER — SODIUM CHLORIDE 0.9 % IV SOLN
10.0000 mg | Freq: Once | INTRAVENOUS | Status: AC
  Administered 2020-11-01: 10 mg via INTRAVENOUS
  Filled 2020-11-01: qty 10

## 2020-11-01 MED ORDER — HEPARIN SOD (PORK) LOCK FLUSH 100 UNIT/ML IV SOLN
500.0000 [IU] | Freq: Once | INTRAVENOUS | Status: AC | PRN
  Administered 2020-11-01: 500 [IU]
  Filled 2020-11-01: qty 5

## 2020-11-01 MED ORDER — LORAZEPAM 2 MG/ML IJ SOLN
1.0000 mg | Freq: Once | INTRAMUSCULAR | Status: AC
  Administered 2020-11-01: 1 mg via INTRAVENOUS

## 2020-11-01 MED ORDER — DIPHENHYDRAMINE HCL 50 MG/ML IJ SOLN
INTRAMUSCULAR | Status: AC
  Filled 2020-11-01: qty 1

## 2020-11-01 MED ORDER — PROMETHAZINE HCL 12.5 MG PO TABS: 12.5000 mg | ORAL_TABLET | Freq: Four times a day (QID) | ORAL | 2 refills | Status: DC | PRN

## 2020-11-01 MED ORDER — DIPHENHYDRAMINE HCL 50 MG/ML IJ SOLN
25.0000 mg | Freq: Once | INTRAMUSCULAR | Status: AC
  Administered 2020-11-01: 25 mg via INTRAVENOUS

## 2020-11-01 MED ORDER — ALBUTEROL SULFATE HFA 108 (90 BASE) MCG/ACT IN AERS: 1.0000 | INHALATION_SPRAY | RESPIRATORY_TRACT | 2 refills | Status: DC | PRN

## 2020-11-01 MED ORDER — LORAZEPAM 2 MG/ML IJ SOLN
INTRAMUSCULAR | Status: AC
  Filled 2020-11-01: qty 1

## 2020-11-01 MED ORDER — FAMOTIDINE IN NACL 20-0.9 MG/50ML-% IV SOLN
INTRAVENOUS | Status: AC
  Filled 2020-11-01: qty 50

## 2020-11-01 MED ORDER — GABAPENTIN 300 MG PO CAPS: 600.0000 mg | ORAL_CAPSULE | Freq: Every day | ORAL | 5 refills | Status: DC

## 2020-11-01 MED ORDER — SODIUM CHLORIDE 0.9 % IV SOLN
45.0000 mg/m2 | Freq: Once | INTRAVENOUS | Status: AC
  Administered 2020-11-01: 96 mg via INTRAVENOUS
  Filled 2020-11-01: qty 16

## 2020-11-01 NOTE — Patient Instructions (Signed)
[facility] Discharge Instructions for Patients Receiving Chemotherapy  Today you received the following chemotherapy agents Paclitaxel  To help prevent nausea and vomiting after your treatment, we encourage you to take your nausea medication as prescribed.   If you develop nausea and vomiting that is not controlled by your nausea medication, call the clinic.   BELOW ARE SYMPTOMS THAT SHOULD BE REPORTED IMMEDIATELY:  *FEVER GREATER THAN 100.5 F  *CHILLS WITH OR WITHOUT FEVER  NAUSEA AND VOMITING THAT IS NOT CONTROLLED WITH YOUR NAUSEA MEDICATION  *UNUSUAL SHORTNESS OF BREATH  *UNUSUAL BRUISING OR BLEEDING  TENDERNESS IN MOUTH AND THROAT WITH OR WITHOUT PRESENCE OF ULCERS  *URINARY PROBLEMS  *BOWEL PROBLEMS  UNUSUAL RASH Items with * indicate a potential emergency and should be followed up as soon as possible.  Feel free to call the clinic should you have any questions or concerns. The clinic phone number is [phone].  Please show the [place] at check-in to the Emergency Department and triage nurse.  Paclitaxel injection What is this medicine? PACLITAXEL (PAK li TAX el) is a chemotherapy drug. It targets fast dividing cells, like cancer cells, and causes these cells to die. This medicine is used to treat ovarian cancer, breast cancer, lung cancer, Kaposi's sarcoma, and other cancers. This medicine may be used for other purposes; ask your health care provider or pharmacist if you have questions. COMMON BRAND NAME(S): Onxol, Taxol What should I tell my health care provider before I take this medicine? They need to know if you have any of these conditions:  history of irregular heartbeat  liver disease  low blood counts, like low white cell, platelet, or red cell counts  lung or breathing disease, like asthma  tingling of the fingers or toes, or other nerve disorder  an unusual or allergic reaction to paclitaxel, alcohol, polyoxyethylated  castor oil, other chemotherapy, other medicines, foods, dyes, or preservatives  pregnant or trying to get pregnant  breast-feeding How should I use this medicine? This drug is given as an infusion into a vein. It is administered in a hospital or clinic by a specially trained health care professional. Talk to your pediatrician regarding the use of this medicine in children. Special care may be needed. Overdosage: If you think you have taken too much of this medicine contact a poison control center or emergency room at once. NOTE: This medicine is only for you. Do not share this medicine with others. What if I miss a dose? It is important not to miss your dose. Call your doctor or health care professional if you are unable to keep an appointment. What may interact with this medicine? Do not take this medicine with any of the following medications:  live virus vaccines This medicine may also interact with the following medications:  antiviral medicines for hepatitis, HIV or AIDS  certain antibiotics like erythromycin and clarithromycin  certain medicines for fungal infections like ketoconazole and itraconazole  certain medicines for seizures like carbamazepine, phenobarbital, phenytoin  gemfibrozil  nefazodone  rifampin  St. John's wort This list may not describe all possible interactions. Give your health care provider a list of all the medicines, herbs, non-prescription drugs, or dietary supplements you use. Also tell them if you smoke, drink alcohol, or use illegal drugs. Some items may interact with your medicine. What should I watch for while using this medicine? Your condition will be monitored carefully while you are receiving this medicine. You will need important blood work  done while you are taking this medicine. This medicine can cause serious allergic reactions. To reduce your risk you will need to take other medicine(s) before treatment with this medicine. If you  experience allergic reactions like skin rash, itching or hives, swelling of the face, lips, or tongue, tell your doctor or health care professional right away. In some cases, you may be given additional medicines to help with side effects. Follow all directions for their use. This drug may make you feel generally unwell. This is not uncommon, as chemotherapy can affect healthy cells as well as cancer cells. Report any side effects. Continue your course of treatment even though you feel ill unless your doctor tells you to stop. Call your doctor or health care professional for advice if you get a fever, chills or sore throat, or other symptoms of a cold or flu. Do not treat yourself. This drug decreases your body's ability to fight infections. Try to avoid being around people who are sick. This medicine may increase your risk to bruise or bleed. Call your doctor or health care professional if you notice any unusual bleeding. Be careful brushing and flossing your teeth or using a toothpick because you may get an infection or bleed more easily. If you have any dental work done, tell your dentist you are receiving this medicine. Avoid taking products that contain aspirin, acetaminophen, ibuprofen, naproxen, or ketoprofen unless instructed by your doctor. These medicines may hide a fever. Do not become pregnant while taking this medicine. Women should inform their doctor if they wish to become pregnant or think they might be pregnant. There is a potential for serious side effects to an unborn child. Talk to your health care professional or pharmacist for more information. Do not breast-feed an infant while taking this medicine. Men are advised not to father a child while receiving this medicine. This product may contain alcohol. Ask your pharmacist or healthcare provider if this medicine contains alcohol. Be sure to tell all healthcare providers you are taking this medicine. Certain medicines, like metronidazole  and disulfiram, can cause an unpleasant reaction when taken with alcohol. The reaction includes flushing, headache, nausea, vomiting, sweating, and increased thirst. The reaction can last from 30 minutes to several hours. What side effects may I notice from receiving this medicine? Side effects that you should report to your doctor or health care professional as soon as possible:  allergic reactions like skin rash, itching or hives, swelling of the face, lips, or tongue  breathing problems  changes in vision  fast, irregular heartbeat  high or low blood pressure  mouth sores  pain, tingling, numbness in the hands or feet  signs of decreased platelets or bleeding - bruising, pinpoint red spots on the skin, black, tarry stools, blood in the urine  signs of decreased red blood cells - unusually weak or tired, feeling faint or lightheaded, falls  signs of infection - fever or chills, cough, sore throat, pain or difficulty passing urine  signs and symptoms of liver injury like dark yellow or brown urine; general ill feeling or flu-like symptoms; light-colored stools; loss of appetite; nausea; right upper belly pain; unusually weak or tired; yellowing of the eyes or skin  swelling of the ankles, feet, hands  unusually slow heartbeat Side effects that usually do not require medical attention (report to your doctor or health care professional if they continue or are bothersome):  diarrhea  hair loss  loss of appetite  muscle or joint pain    nausea, vomiting  pain, redness, or irritation at site where injected  tiredness This list may not describe all possible side effects. Call your doctor for medical advice about side effects. You may report side effects to FDA at 1-800-FDA-1088. Where should I keep my medicine? This drug is given in a hospital or clinic and will not be stored at home. NOTE: This sheet is a summary. It may not cover all possible information. If you have  questions about this medicine, talk to your doctor, pharmacist, or health care provider.  2021 Elsevier/Gold Standard (2019-06-21 13:37:23)

## 2020-11-01 NOTE — Progress Notes (Signed)
Sympt[facility] Management Clinic Progress Note   Tracy Dyer [id number] 23-Apr-1971 50 y.o.  Tracy Dyer Tracy Dyer is managed by Dr. [name]  Actively treated with chemotherapy/immunotherapy/hormonal therapy: yes  Current therapy: Adriamycin and Cytoxan, beginning cycle #1 of Taxol today.  Last treated: 10/18/2020 (Cycle4Day 1)  Next scheduled appointment with provider: 11/08/2020  Assessment: Plan:    Malignant neoplasm of upper-outer quadrant of left breast in female, estrogen receptor positive ([facility])  Neuropathy - Plan: gabapentin (NEURONTIN) 300 MG capsule  Nausea without vomiting - Plan: promethazine (PHENERGAN) 12.5 MG tablet  Cough - Plan: albuterol (VENTOLIN HFA) 108 (90 Base) MCG/ACT inhaler  Chemotherapy-induced neutropenia (HCC)   ER positive malignant neoplasm of the left breast: Tracy Dyer presents to the clinic today for consideration of cycle 1 of paclitaxel.  We will proceed with her treatment today which has previously been dosed reduced.  She will return as scheduled on 11/08/2020.  Neuropathy: The patient has been instructed to increase her gabapentin to 600 mg p.o. nightly.  Nausea without vomiting: The patient was told not to take her nausea medicines prophylactically but only to take them should she develop nausea.  She was given a prescription for Phenergan 12.5 mg p.o. every 6 hours as needed for nausea.  Cough, patient was given a prescription for an albuterol inhaler.  She was instructed to use 1 to 2 puffs every 4 hours as needed for chest tightness, cough, or shortness of breath.  Chemotherapy-induced neutropenia.  On review of the patient's chart she did not receive Fulphila with her fourth cycle of Adriamycin and Cytoxan.  Her white count returned today with an Tracy Dyer of 1.3.  Neutropenic precautions were reviewed with the patient.  She was told to do good handwashing's, avoid sick contacts, avoid large crowds, and to call immediately for  1 fever of 100.5 or multiple fevers of 100.4 over a 12-hour period.  She expresses understanding and agreement with this plan.  Please see After Visit Summary for patient specific instructions.  Future Appointments  Date Time Provider [place]  11/08/2020  8:45 AM [facility] None  11/08/2020  9:00 AM [facility] [place] FLUSH [facility] None  11/08/2020  9:30 AM [name], MD [facility] None  11/08/2020 10:15 AM [facility] INFUSION [facility] None    No orders of the defined types were placed in this encounter.      Subjective:   Patient ID:  Tracy Dyer is a 50 y.o. (DOB Jan 23, 1971) female.  Chief Complaint: No chief complaint on file.   HPI Tracy Dyer Tracy Dyer is a 50 y.o. female with a diagnosis of an ER positive malignant neoplasm of the left breast.  She is managed by Dr. [name] and is status post cycle 4 of Adriamycin and Cytoxan.  It was noted that she did not receive Fulphila following cycle 4 of Adriamycin and Cytoxan which was dosed on 10/18/2020.  She reports that she continues to have nausea despite her use of Compazine.  She reports that Compazine causes her to wake up startled and confused.  Zofran gives her a headache.  She reports that she has constipation.  She has been taking senna, Dulcolax, and Colace without good results.  She also reports a cough with deep breathing.  She is fatigued.  She continues to have neuropathy despite her use of gabapentin 300 mg p.o. nightly.   Medications: I have reviewed the patient's current medications.  Allergies:  Allergies  Allergen Reactions  .  Sulfa Antibiotics     Other reaction(s): Rash, urticarial  . Hydromorphone Anxiety  . Oxycodone Anxiety  . Propoxyphene Rash and Anxiety  . Sulfamethoxazole Rash    Feel sun burn    Past Medical History:  Diagnosis Date  . Anxiety   . Cancer [facility])    breast  . Family history of breast cancer   . Family history of lung cancer   .  Family history of ovarian cancer   . GERD (gastroesophageal reflux disease)    diet controlled  . Seasonal allergies     Past Surgical History:  Procedure Laterality Date  . BREAST BIOPSY Left 2019   x 2 biopsy  . BREAST EXCISIONAL BIOPSY Left   . CHOLECYSTECTOMY  2008  . PORTACATH PLACEMENT N/A 09/05/2020   Procedure: INSERTION PORT-A-CATH;  Surgeon: [name], MD;  Location: [facility];  Service: General;  Laterality: N/A;  PEC BLOCK  . WISDOM TOOTH EXTRACTION      Family History  Problem Relation Age of Onset  . Eczema Father   . Allergic rhinitis Brother   . Breast cancer Mother        in 16's  . Cancer Maternal Uncle        unk type  . Lung cancer Maternal Grandfather 81  . Heart Problems Paternal Grandfather   . Melanoma Cousin   . Ovarian cancer Paternal Great-grandmother   . Cancer Paternal Great-grandmother        GYN cancer, possibly ovarian?  . Cancer Maternal Aunt        half aunts/uncles x5- rare cancers    Social History   Socioeconomic History  . Marital status: Married    Spouse name: Not on file  . Number of children: Not on file  . Years of education: Not on file  . Highest education level: Not on file  Occupational History  . Not on file  Tobacco Use  . Smoking status: Never Smoker  . Smokeless tobacco: Never Used  Vaping Use  . Vaping Use: Never used  Substance and Sexual Activity  . Alcohol use: Not Currently  . Drug use: Never  . Sexual activity: Yes    Birth control/protection: None  Other Topics Concern  . Not on file  Social History Narrative  . Not on file   Social Determinants of Health   Financial Resource Strain: Not on file  Food Insecurity: No Food Insecurity  . Worried About Charity fundraiser in the Last Year: Never true  . Ran Out of Food in the Last Year: Never true  Transportation Needs: No Transportation Needs  . Lack of Transportation (Medical): No  . Lack of Transportation (Non-Medical): No  Physical  Activity: Not on file  Stress: Not on file  Social Connections: Not on file  Intimate Partner Violence: Not on file    Past Medical History, Surgical history, Social history, and Family history were reviewed and updated as appropriate.   Please see review of systems for further details on the patient's review from today.   Review of Systems:  Review of Systems  Constitutional: Negative for chills, diaphoresis and fever.  HENT: Negative for trouble swallowing and voice change.   Respiratory: Positive for cough. Negative for chest tightness, shortness of breath and wheezing.   Cardiovascular: Negative for chest pain and palpitations.  Gastrointestinal: Positive for constipation. Negative for abdominal pain, diarrhea, nausea and vomiting.  Musculoskeletal: Negative for back pain and myalgias.  Neurological: Positive for numbness and  headaches. Negative for dizziness and light-headedness.    Objective:   Physical Exam:  BP (!) 105/52 (BP Location: Left Arm, Patient Position: Sitting)   Pulse 77   Temp 98.1 F (36.7 C) (Tympanic)   Resp 20   Ht 5\' 6"  (1.676 m)   Wt 204 lb (92.5 kg)   SpO2 99%   BMI 32.93 kg/m  ECOG: 0  Physical Exam Constitutional:      General: She is not in acute distress.    Appearance: She is not diaphoretic.  HENT:     Head: Normocephalic and atraumatic.  Eyes:     General: No scleral icterus.       Right eye: No discharge.        Left eye: No discharge.     Conjunctiva/sclera: Conjunctivae normal.  Cardiovascular:     Rate and Rhythm: Normal rate and regular rhythm.     Heart sounds: Normal heart sounds. No murmur heard. No friction rub. No gallop.   Pulmonary:     Effort: Pulmonary effort is normal. No respiratory distress.     Breath sounds: Normal breath sounds. No wheezing or rales.  Skin:    General: Skin is warm and dry.     Findings: No erythema or rash.  Neurological:     Mental Status: She is alert.     Coordination: Coordination  normal.     Gait: Gait normal.  Psychiatric:        Mood and Affect: Mood normal.        Behavior: Behavior normal.        Thought Content: Thought content normal.        Judgment: Judgment normal.     Lab Review:     Component Value Date/Time   NA 141 11/01/2020 0920   K 4.1 11/01/2020 0920   CL 108 11/01/2020 0920   CO2 23 11/01/2020 0920   GLUCOSE 107 (H) 11/01/2020 0920   BUN 11 11/01/2020 0920   CREATININE 0.71 11/01/2020 0920   CREATININE 0.73 09/13/2020 1048   CALCIUM 8.4 (L) 11/01/2020 0920   PROT 6.4 (L) 11/01/2020 0920   ALBUMIN 3.6 11/01/2020 0920   AST 20 11/01/2020 0920   AST 13 (L) 09/13/2020 1048   ALT 22 11/01/2020 0920   ALT 14 09/13/2020 1048   ALKPHOS 93 11/01/2020 0920   BILITOT 0.4 11/01/2020 0920   BILITOT 0.4 09/13/2020 1048   GFRNONAA >60 11/01/2020 0920   GFRNONAA >60 09/13/2020 1048       Component Value Date/Time   WBC 2.4 (L) 11/01/2020 0920   RBC 3.49 (L) 11/01/2020 0920   HGB 11.0 (L) 11/01/2020 0920   HGB 13.7 09/13/2020 1048   HCT 32.1 (L) 11/01/2020 0920   PLT 210 11/01/2020 0920   PLT 176 09/13/2020 1048   MCV 92.0 11/01/2020 0920   MCH 31.5 11/01/2020 0920   MCHC 34.3 11/01/2020 0920   RDW 18.5 (H) 11/01/2020 0920   LYMPHSABS 0.4 (L) 11/01/2020 0920   MONOABS 0.5 11/01/2020 0920   EOSABS 0.1 11/01/2020 0920   BASOSABS 0.1 11/01/2020 0920   -------------------------------  Imaging from last 24 hours (if applicable):  Radiology interpretation: No results found.      This case was discussed with Dr. Lindi Adie. He expresses agreement with my management of this patient.

## 2020-11-01 NOTE — Progress Notes (Signed)
Because of neuropathy and ANC 1.3, we decided to reduce the dosage of her chemo of Taxol to 45 mg meter square.

## 2020-11-01 NOTE — Progress Notes (Signed)
OK to treat despite ANC of 1.3 per Dr. Lindi Adie.  Sandi Mealy, MHS, PA-C Physician Assistant

## 2020-11-05 ENCOUNTER — Telehealth: Payer: Self-pay | Admitting: *Deleted

## 2020-11-05 NOTE — Telephone Encounter (Signed)
Called pt to see how she did with her recent treatment .  She reports "good, better that she thought".  She knows how to reach [facility] phone # for any questions/concerns.  She states she needs to extend her STD.  Informed to bring paperwork in.

## 2020-11-05 NOTE — Telephone Encounter (Signed)
-----   Message from Rolene Course, RN sent at 11/01/2020  3:40 PM EDT ----- Regarding: Lindi Adie 1st Tx F/U call - Taxol [place] 1st Tx F/U call - Taxol.  Tolerated well.

## 2020-11-07 NOTE — Progress Notes (Signed)
Patient Care Team: [name], MD as PCP - General (Family Medicine)  DIAGNOSIS:    ICD-10-CM   1. Malignant neoplasm of upper-outer quadrant of left breast in female, estrogen receptor positive ([place])  C50.412    Z17.0     SUMMARY OF ONCOLOGIC HISTORY: Oncology History  Malignant neoplasm of upper-outer quadrant of left breast in female, estrogen receptor positive ([place])  08/21/2020 Initial Diagnosis   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 No pathogenic variants identified on the [facility] Breast Cancer STAT Panel + Multi-Cancer Panel. VUS in [facility] called c.983C>T identified. The report date is 09/04/2020.   The STAT Breast cancer panel offered by [facility] includes sequencing and rearrangement analysis for the following 9 genes:  ATM, BRCA1, BRCA2, CDH1, CHEK2, PALB2, PTEN, STK11 and TP53.    The Multi-Cancer Panel offered by [facility] includes sequencing and/or deletion duplication testing of the following 85 genes: AIP, ALK, APC, ATM, AXIN2,BAP1,  BARD1, BLM, BMPR1A, BRCA1, BRCA2, BRIP1, CASR, CDC73, CDH1, CDK4, CDKN1B, CDKN1C, CDKN2A (p14ARF), CDKN2A (p16INK4a), CEBPA, CHEK2, CTNNA1, DICER1, DIS3L2, EGFR (c.2369C>T, p.Thr790Met variant only), EPCAM (Deletion/duplication testing only), FH, FLCN, GATA2, GPC3, GREM1 (Promoter region deletion/duplication testing only), HOXB13 (c.251G>A, p.Gly84Glu), HRAS, KIT, MAX,  MEN1, MET, MITF (c.952G>A, p.Glu318Lys variant only), MLH1, MSH2, MSH3, MSH6, MUTYH, NBN, NF1, NF2, NTHL1, PALB2, PDGFRA, PHOX2B, PMS2, POLD1, POLE, POT1, PRKAR1A, PTCH1, PTEN, RAD50, RAD51C, RAD51D, RB1, RECQL4, RET, RNF43, RUNX1, SDHAF2, SDHA (sequence changes only), SDHB, SDHC, SDHD, SMAD4, SMARCA4, SMARCB1, SMARCE1, STK11, SUFU, TERC, TERT, TMEM127, TP53, TSC1, TSC2, VHL, WRN and WT1.    09/06/2020 -  Chemotherapy    Patient is on Treatment Plan: BREAST ADJUVANT DOSE DENSE AC Q14D / PACLITAXEL Q7D        CHIEF COMPLIANT: Cycle2 Taxol  INTERVAL HISTORY: Tracy Dyer is a 50 y.o. with above-mentioned history of leftbreast cancer currently onneoadjuvant chemotherapy with weekly Taxol after completing four cycles of dose dense Adriamycin and Cytoxan.She presents to the clinic todayfor a toxicity checkandcycle2.  ALLERGIES:  is allergic to sulfa antibiotics, hydromorphone, oxycodone, propoxyphene, and sulfamethoxazole.  MEDICATIONS:  Current Outpatient Medications  Medication Sig Dispense Refill  . albuterol (VENTOLIN HFA) 108 (90 Base) MCG/ACT inhaler Inhale 1-2 puffs into the lungs every 4 (four) hours as needed for wheezing or shortness of breath. 8 g 2  . clonazePAM (KLONOPIN) 1 MG tablet Take 1 tablet (1 mg total) by mouth 2 (two) times daily as needed for anxiety. 60 tablet 1  . dexamethasone (DECADRON) 4 MG tablet TAKE 1 TABLET (4 MG TOTAL) BY MOUTH DAILY. TAKE 1 TABLET DAY AFTER CHEMO AND 1 TABLET 2 DAYS AFTER CHEMO WITH FOOD 8 tablet 0  . fluticasone (FLONASE) 50 MCG/ACT nasal spray Place 1 spray into both nostrils daily as needed for allergies.    Marland Kitchen gabapentin (NEURONTIN) 300 MG capsule Take 2 capsules (600 mg total) by mouth at bedtime. 60 capsule 5  . l-methylfolate-B6-B12 (METANX)  3-35-2 MG TABS tablet Take 1 tablet by mouth daily.    Marland Kitchen lidocaine-prilocaine (EMLA) cream Apply to affected area once 30 g 3  . LORazepam (ATIVAN) 0.5 MG tablet Take 1 tablet (0.5 mg  total) by mouth once as needed for up to 1 dose for anxiety. 4 tablet 0  . MAGNESIUM CITRATE PO Take 405 mg by mouth daily. 135 mg    . Multiple Vitamins-Minerals (MULTIVITAMIN WITH MINERALS) tablet Take 1 tablet by mouth daily.    Marland Kitchen omeprazole (PRILOSEC) 20 MG capsule Take 1 capsule (20 mg total) by mouth daily. 30 capsule 3  . ondansetron (ZOFRAN) 8 MG tablet Take 1 tablet (8 mg total) by mouth 2 (two) times daily as needed. Start on the third day after chemotherapy. 30 tablet 1  . promethazine (PHENERGAN) 12.5 MG tablet Take 1 tablet (12.5 mg total) by mouth every 6 (six) hours as needed for nausea or vomiting. 30 tablet 2  . sertraline (ZOLOFT) 100 MG tablet Take 100 mg by mouth at bedtime.    . traMADol (ULTRAM) 50 MG tablet Take 1 tablet (50 mg total) by mouth every 6 (six) hours as needed. 10 tablet 0   No current facility-administered medications for this visit.    PHYSICAL EXAMINATION: ECOG PERFORMANCE STATUS: 1 - Symptomatic but completely ambulatory  Vitals:   11/08/20 0953  BP: 120/65  Pulse: 64  Resp: 20  Temp: 98.3 F (36.8 C)  SpO2: 99%   Filed Weights   11/08/20 0953  Weight: 208 lb 1.6 oz (94.4 kg)    LABORATORY DATA:  I have reviewed the data as listed CMP Latest Ref Rng & Units 11/01/2020 10/18/2020 10/04/2020  Glucose 70 - 99 mg/dL 107(H) 110(H) 125(H)  BUN 6 - 20 mg/dL _0 Creatinine 0.44 - 1.00 mg/dL 0.71 0.76 0.76  Sodium 135 - 145 mmol/L 141 140 137  Potassium 3.5 - 5.1 mmol/L 4.1 4.1 4.1  Chloride 98 - 111 mmol/L 108 109 107  CO2 22 - 32 mmol/L _1 Calcium 8.9 - 10.3 mg/dL 8.4(L) 8.9 8.6(L)  Total Protein 6.5 - 8.1 g/dL 6.4(L) 6.5 6.8  Total Bilirubin 0.3 - 1.2 mg/dL 0.4 0.3 0.2(L)  Alkaline Phos 38 - 126 U/L 93 127(H) 119  AST 15 - 41 U/L _2 ALT 0 - 44 U/L 22 27 32    Lab Results  Component Value Date   WBC 3.6 (L) 11/08/2020   HGB 11.3 (L) 11/08/2020   HCT 33.5 (L) 11/08/2020   MCV 94.6 11/08/2020   PLT 334 11/08/2020    NEUTROABS 1.9 11/08/2020    ASSESSMENT & PLAN:  Malignant neoplasm of upper-outer quadrant of left breast in female, estrogen receptor positive ([place]) 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%, PR 95%, HER2 equivocal, FISH pending, Ki-67 20% T1CN1 stage IIa MammaPrint: High risk: Luminal type B, probability of path CR 6% with chemo and antiestrogen therapy predicted benefit of treatment at 5 years 94.6%, average 10-year risk of recurrence untreated: 29%  Treatment plan: 1.Neoadjuvant chemotherapy with dose dense Adriamycin and Cytoxan followed by Taxol weekly x12 2.double mastectomy with reconstruction and targeted node dissection 3.Adjuvant radiation therapy 4.Followed by adjuvant antiestrogen therapy. ------------------------------------------------------------------------------------------------------------------------- Current treatment:  Completed 4 cycles ofdose dense Adriamycin Cytoxan, today is cycle 2 Taxol Echocardiogram2/2/22: EF 60-65%  Chemo Toxicities: Patient has done significantly better with Taxol.  She did not have any nausea or vomiting issues.  Denies any fatigue.  So far denies neuropathy.  Emotionally she is doing a lot better She has a virtual appointment with the Psychiatric nurse in [place] who would perform a skin and nipple sparing and nerve regenerating type of breast cancer reconstruction.  Patient is interested in double mastectomies with reconstruction.  Return to clinic weekly for Taxol and every other week for follow-up with me    No orders of the defined types were placed in this encounter.  The patient has a good understanding of the overall plan. she  agrees with it. she will call with any problems that may develop before the next visit here.  Total time spent: 30 mins including face to face time and time spent for planning, charting and coordination of care  Rulon Eisenmenger, MD, MPH 11/08/2020  I, Molly Dorshimer, am acting as scribe for Dr. [name].  I have reviewed the above documentation for accuracy and completeness, and I agree with the above.

## 2020-11-08 ENCOUNTER — Inpatient Hospital Stay: Payer: BC Managed Care – PPO

## 2020-11-08 ENCOUNTER — Other Ambulatory Visit: Payer: Self-pay

## 2020-11-08 ENCOUNTER — Inpatient Hospital Stay: Payer: BC Managed Care – PPO | Admitting: Hematology and Oncology

## 2020-11-08 DIAGNOSIS — C50412 Malignant neoplasm of upper-outer quadrant of left female breast: Secondary | ICD-10-CM

## 2020-11-08 DIAGNOSIS — Z803 Family history of malignant neoplasm of breast: Secondary | ICD-10-CM | POA: Diagnosis not present

## 2020-11-08 DIAGNOSIS — D701 Agranulocytosis secondary to cancer chemotherapy: Secondary | ICD-10-CM | POA: Diagnosis not present

## 2020-11-08 DIAGNOSIS — Z5111 Encounter for antineoplastic chemotherapy: Secondary | ICD-10-CM | POA: Diagnosis not present

## 2020-11-08 DIAGNOSIS — Z170 Estrogen receptor positive status [ER+]: Secondary | ICD-10-CM

## 2020-11-08 DIAGNOSIS — Z79899 Other long term (current) drug therapy: Secondary | ICD-10-CM | POA: Diagnosis not present

## 2020-11-08 DIAGNOSIS — T451X5A Adverse effect of antineoplastic and immunosuppressive drugs, initial encounter: Secondary | ICD-10-CM | POA: Diagnosis not present

## 2020-11-08 DIAGNOSIS — R5383 Other fatigue: Secondary | ICD-10-CM | POA: Diagnosis not present

## 2020-11-08 DIAGNOSIS — E860 Dehydration: Secondary | ICD-10-CM | POA: Diagnosis not present

## 2020-11-08 DIAGNOSIS — Z8041 Family history of malignant neoplasm of ovary: Secondary | ICD-10-CM | POA: Diagnosis not present

## 2020-11-08 DIAGNOSIS — Z801 Family history of malignant neoplasm of trachea, bronchus and lung: Secondary | ICD-10-CM | POA: Diagnosis not present

## 2020-11-08 DIAGNOSIS — I951 Orthostatic hypotension: Secondary | ICD-10-CM | POA: Diagnosis not present

## 2020-11-08 DIAGNOSIS — Z95828 Presence of other vascular implants and grafts: Secondary | ICD-10-CM

## 2020-11-08 LAB — COMPREHENSIVE METABOLIC PANEL
ALT: 27 U/L (ref 0–44)
AST: 23 U/L (ref 15–41)
Albumin: 3.5 g/dL (ref 3.5–5.0)
Alkaline Phosphatase: 83 U/L (ref 38–126)
Anion gap: 12 (ref 5–15)
BUN: 13 mg/dL (ref 6–20)
CO2: 22 mmol/L (ref 22–32)
Calcium: 8.5 mg/dL — ABNORMAL LOW (ref 8.9–10.3)
Chloride: 109 mmol/L (ref 98–111)
Creatinine, Ser: 0.77 mg/dL (ref 0.44–1.00)
GFR, Estimated: >60 mL/min (ref >60)
Glucose, Bld: 94 mg/dL (ref 70–99)
Potassium: 4.2 mmol/L (ref 3.5–5.1)
Sodium: 143 mmol/L (ref 135–145)
Total Bilirubin: 0.2 mg/dL — ABNORMAL LOW (ref 0.3–1.2)
Total Protein: 6.1 g/dL — ABNORMAL LOW (ref 6.5–8.1)

## 2020-11-08 LAB — CBC WITH DIFFERENTIAL/PLATELET
Abs Immature Granulocytes: 0.06 10*3/uL (ref 0.00–0.07)
Basophils Absolute: 0.1 10*3/uL (ref 0.0–0.1)
Basophils Relative: 3 %
Eosinophils Absolute: 0.1 10*3/uL (ref 0.0–0.5)
Eosinophils Relative: 4 %
HCT: 33.5 % — ABNORMAL LOW (ref 36.0–46.0)
Hemoglobin: 11.3 g/dL — ABNORMAL LOW (ref 12.0–15.0)
Immature Granulocytes: 2 %
Lymphocytes Relative: 23 %
Lymphs Abs: 0.8 10*3/uL (ref 0.7–4.0)
MCH: 31.9 pg (ref 26.0–34.0)
MCHC: 33.7 g/dL (ref 30.0–36.0)
MCV: 94.6 fL (ref 80.0–100.0)
Monocytes Absolute: 0.6 10*3/uL (ref 0.1–1.0)
Monocytes Relative: 15 %
Neutro Abs: 1.9 10*3/uL (ref 1.7–7.7)
Neutrophils Relative %: 53 %
Platelets: 334 10*3/uL (ref 150–400)
RBC: 3.54 MIL/uL — ABNORMAL LOW (ref 3.87–5.11)
RDW: 18.3 % — ABNORMAL HIGH (ref 11.5–15.5)
WBC: 3.6 10*3/uL — ABNORMAL LOW (ref 4.0–10.5)
nRBC: 0 % (ref 0.0–0.2)

## 2020-11-08 MED ORDER — FAMOTIDINE IN NACL 20-0.9 MG/50ML-% IV SOLN
20.0000 mg | Freq: Once | INTRAVENOUS | Status: AC
  Administered 2020-11-08: 20 mg via INTRAVENOUS

## 2020-11-08 MED ORDER — SODIUM CHLORIDE 0.9 % IV SOLN
10.0000 mg | Freq: Once | INTRAVENOUS | Status: AC
  Administered 2020-11-08: 10 mg via INTRAVENOUS
  Filled 2020-11-08: qty 10

## 2020-11-08 MED ORDER — FAMOTIDINE IN NACL 20-0.9 MG/50ML-% IV SOLN
INTRAVENOUS | Status: AC
  Filled 2020-11-08: qty 50

## 2020-11-08 MED ORDER — SODIUM CHLORIDE 0.9% FLUSH
10.0000 mL | Freq: Once | INTRAVENOUS | Status: AC
  Administered 2020-11-08: 10 mL
  Filled 2020-11-08: qty 10

## 2020-11-08 MED ORDER — HEPARIN SOD (PORK) LOCK FLUSH 100 UNIT/ML IV SOLN
500.0000 [IU] | Freq: Once | INTRAVENOUS | Status: DC | PRN
  Filled 2020-11-08: qty 5

## 2020-11-08 MED ORDER — LORAZEPAM 2 MG/ML IJ SOLN
INTRAMUSCULAR | Status: AC
  Filled 2020-11-08: qty 1

## 2020-11-08 MED ORDER — SODIUM CHLORIDE 0.9 % IV SOLN
45.0000 mg/m2 | Freq: Once | INTRAVENOUS | Status: AC
  Administered 2020-11-08: 96 mg via INTRAVENOUS
  Filled 2020-11-08: qty 16

## 2020-11-08 MED ORDER — DIPHENHYDRAMINE HCL 50 MG/ML IJ SOLN
25.0000 mg | Freq: Once | INTRAMUSCULAR | Status: AC
  Administered 2020-11-08: 25 mg via INTRAVENOUS

## 2020-11-08 MED ORDER — DIPHENHYDRAMINE HCL 50 MG/ML IJ SOLN
INTRAMUSCULAR | Status: AC
  Filled 2020-11-08: qty 1

## 2020-11-08 MED ORDER — SODIUM CHLORIDE 0.9 % IV SOLN
Freq: Once | INTRAVENOUS | Status: AC
  Filled 2020-11-08: qty 250

## 2020-11-08 MED ORDER — LORAZEPAM 2 MG/ML IJ SOLN
1.0000 mg | Freq: Once | INTRAMUSCULAR | Status: AC
Start: 2020-11-08 — End: 2020-11-08
  Administered 2020-11-08: 1 mg via INTRAVENOUS

## 2020-11-08 MED ORDER — SODIUM CHLORIDE 0.9% FLUSH
10.0000 mL | INTRAVENOUS | Status: DC | PRN
  Filled 2020-11-08: qty 10

## 2020-11-08 NOTE — Patient Instructions (Signed)
[facility] Discharge Instructions for Patients Receiving Chemotherapy  Today you received the following chemotherapy agents Paclitaxel  To help prevent nausea and vomiting after your treatment, we encourage you to take your nausea medication as directed   If you develop nausea and vomiting that is not controlled by your nausea medication, call the clinic.   BELOW ARE SYMPTOMS THAT SHOULD BE REPORTED IMMEDIATELY:  *FEVER GREATER THAN 100.5 F  *CHILLS WITH OR WITHOUT FEVER  NAUSEA AND VOMITING THAT IS NOT CONTROLLED WITH YOUR NAUSEA MEDICATION  *UNUSUAL SHORTNESS OF BREATH  *UNUSUAL BRUISING OR BLEEDING  TENDERNESS IN MOUTH AND THROAT WITH OR WITHOUT PRESENCE OF ULCERS  *URINARY PROBLEMS  *BOWEL PROBLEMS  UNUSUAL RASH Items with * indicate a potential emergency and should be followed up as soon as possible.  Feel free to call the clinic should you have any questions or concerns. The clinic phone number is [phone].  Please show the CHEMO ALERT CARD at check-in to the Emergency Department and triage nurse.   

## 2020-11-08 NOTE — Patient Instructions (Signed)
Implanted Port Insertion, Care After This sheet gives you information about how to care for yourself after your procedure. Your health care provider may also give you more specific instructions. If you have problems or questions, contact your health care provider. What can I expect after the procedure? After the procedure, it is common to have:  Discomfort at the port insertion site.  Bruising on the skin over the port. This should improve over 3-4 days. Follow these instructions at home: Port care  After your port is placed, you will get a manufacturer's information card. The card has information about your port. Keep this card with you at all times.  Take care of the port as told by your health care provider. Ask your health care provider if you or a family member can get training for taking care of the port at home. A home health care nurse may also take care of the port.  Make sure to remember what type of port you have. Incision care  Follow instructions from your health care provider about how to take care of your port insertion site. Make sure you: ? Wash your hands with soap and water before and after you change your bandage (dressing). If soap and water are not available, use hand sanitizer. ? Change your dressing as told by your health care provider. ? Leave stitches (sutures), skin glue, or adhesive strips in place. These skin closures may need to stay in place for 2 weeks or longer. If adhesive strip edges start to loosen and curl up, you may trim the loose edges. Do not remove adhesive strips completely unless your health care provider tells you to do that.  Check your port insertion site every day for signs of infection. Check for: ? Redness, swelling, or pain. ? Fluid or blood. ? Warmth. ? Pus or a bad smell.      Activity  Return to your normal activities as told by your health care provider. Ask your health care provider what activities are safe for you.  Do not  lift anything that is heavier than 10 lb (4.5 kg), or the limit that you are told, until your health care provider says that it is safe. General instructions  Take over-the-counter and prescription medicines only as told by your health care provider.  Do not take baths, swim, or use a hot tub until your health care provider approves. Ask your health care provider if you may take showers. You may only be allowed to take sponge baths.  Do not drive for 24 hours if you were given a sedative during your procedure.  Wear a medical alert bracelet in case of an emergency. This will tell any health care providers that you have a port.  Keep all follow-up visits as told by your health care provider. This is important. Contact a health care provider if:  You cannot flush your port with saline as directed, or you cannot draw blood from the port.  You have a fever or chills.  You have redness, swelling, or pain around your port insertion site.  You have fluid or blood coming from your port insertion site.  Your port insertion site feels warm to the touch.  You have pus or a bad smell coming from the port insertion site. Get help right away if:  You have chest pain or shortness of breath.  You have bleeding from your port that you cannot control. Summary  Take care of the port as told by your   health care provider. Keep the manufacturer's information card with you at all times.  Change your dressing as told by your health care provider.  Contact a health care provider if you have a fever or chills or if you have redness, swelling, or pain around your port insertion site.  Keep all follow-up visits as told by your health care provider. This information is not intended to replace advice given to you by your health care provider. Make sure you discuss any questions you have with your health care provider. Document Revised: 02/15/2018 Document Reviewed: 02/15/2018 Elsevier Patient Education   2021 [employer]  

## 2020-11-08 NOTE — Assessment & Plan Note (Signed)
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 ------------------------------------------------------------------------------------------------------------------------- Current treatment: Completed 4 cycles ofdose dense Adriamycin Cytoxan, today is cycle 1 Taxol Echocardiogram2/2/22: EF 60-65%  Chemo Toxicities: 1. Gastritis: Improved with Prilosec. 2. Nausea: No major issues with nausea.  She does take Compazine at bedtime. 3. Fatigue: Very severe and lasting the entire 2 weeks. 4. Severe panic attacks: Will add IV Ativan with day 1 of chemo 5.  Discomfort of the palms of her hands: I will reduce the dosage of both Adriamycin and Cytoxan today.  Emotionally she is doing a lot better   Return to clinic weekly for Taxol and every other week for follow-up with me

## 2020-11-10 ENCOUNTER — Encounter: Payer: Self-pay | Admitting: Adult Health

## 2020-11-11 ENCOUNTER — Encounter: Payer: Self-pay | Admitting: *Deleted

## 2020-11-12 ENCOUNTER — Encounter: Payer: Self-pay | Admitting: *Deleted

## 2020-11-12 ENCOUNTER — Telehealth: Payer: Self-pay | Admitting: *Deleted

## 2020-11-12 ENCOUNTER — Telehealth: Payer: Self-pay | Admitting: Hematology and Oncology

## 2020-11-12 NOTE — Telephone Encounter (Signed)
Called Dr. Para Skeans office. Informed pt scheduled to see Dr. Iran Planas on 5/11.

## 2020-11-12 NOTE — Telephone Encounter (Signed)
Schedule per 4/8 los. Called and spoke with pt confirmed added appts

## 2020-11-14 ENCOUNTER — Inpatient Hospital Stay: Payer: BC Managed Care – PPO

## 2020-11-15 ENCOUNTER — Other Ambulatory Visit: Payer: Self-pay

## 2020-11-15 ENCOUNTER — Inpatient Hospital Stay: Payer: BC Managed Care – PPO

## 2020-11-15 VITALS — BP 105/58 | HR 58 | Temp 98.0°F | Resp 16

## 2020-11-15 DIAGNOSIS — Z170 Estrogen receptor positive status [ER+]: Secondary | ICD-10-CM

## 2020-11-15 DIAGNOSIS — Z5111 Encounter for antineoplastic chemotherapy: Secondary | ICD-10-CM | POA: Diagnosis not present

## 2020-11-15 DIAGNOSIS — R5383 Other fatigue: Secondary | ICD-10-CM | POA: Diagnosis not present

## 2020-11-15 DIAGNOSIS — Z8041 Family history of malignant neoplasm of ovary: Secondary | ICD-10-CM | POA: Diagnosis not present

## 2020-11-15 DIAGNOSIS — T451X5A Adverse effect of antineoplastic and immunosuppressive drugs, initial encounter: Secondary | ICD-10-CM | POA: Diagnosis not present

## 2020-11-15 DIAGNOSIS — E860 Dehydration: Secondary | ICD-10-CM | POA: Diagnosis not present

## 2020-11-15 DIAGNOSIS — D701 Agranulocytosis secondary to cancer chemotherapy: Secondary | ICD-10-CM | POA: Diagnosis not present

## 2020-11-15 DIAGNOSIS — Z801 Family history of malignant neoplasm of trachea, bronchus and lung: Secondary | ICD-10-CM | POA: Diagnosis not present

## 2020-11-15 DIAGNOSIS — C50412 Malignant neoplasm of upper-outer quadrant of left female breast: Secondary | ICD-10-CM | POA: Diagnosis not present

## 2020-11-15 DIAGNOSIS — Z79899 Other long term (current) drug therapy: Secondary | ICD-10-CM | POA: Diagnosis not present

## 2020-11-15 DIAGNOSIS — Z803 Family history of malignant neoplasm of breast: Secondary | ICD-10-CM | POA: Diagnosis not present

## 2020-11-15 DIAGNOSIS — I951 Orthostatic hypotension: Secondary | ICD-10-CM | POA: Diagnosis not present

## 2020-11-15 LAB — COMPREHENSIVE METABOLIC PANEL
ALT: 28 U/L (ref 0–44)
AST: 25 U/L (ref 15–41)
Albumin: 3.9 g/dL (ref 3.5–5.0)
Alkaline Phosphatase: 68 U/L (ref 38–126)
Anion gap: 7 (ref 5–15)
BUN: 12 mg/dL (ref 6–20)
CO2: 26 mmol/L (ref 22–32)
Calcium: 9.5 mg/dL (ref 8.9–10.3)
Chloride: 105 mmol/L (ref 98–111)
Creatinine, Ser: 0.81 mg/dL (ref 0.44–1.00)
GFR, Estimated: >60 mL/min (ref >60)
Glucose, Bld: 96 mg/dL (ref 70–99)
Potassium: 4.2 mmol/L (ref 3.5–5.1)
Sodium: 138 mmol/L (ref 135–145)
Total Bilirubin: 0.3 mg/dL (ref 0.3–1.2)
Total Protein: 6.1 g/dL — ABNORMAL LOW (ref 6.5–8.1)

## 2020-11-15 LAB — CBC WITH DIFFERENTIAL/PLATELET
Abs Immature Granulocytes: 0.02 10*3/uL (ref 0.00–0.07)
Basophils Absolute: 0.1 10*3/uL (ref 0.0–0.1)
Basophils Relative: 2 %
Eosinophils Absolute: 0.1 10*3/uL (ref 0.0–0.5)
Eosinophils Relative: 2 %
HCT: 35.3 % — ABNORMAL LOW (ref 36.0–46.0)
Hemoglobin: 12.1 g/dL (ref 12.0–15.0)
Immature Granulocytes: 0 %
Lymphocytes Relative: 23 %
Lymphs Abs: 1.1 10*3/uL (ref 0.7–4.0)
MCH: 32.1 pg (ref 26.0–34.0)
MCHC: 34.3 g/dL (ref 30.0–36.0)
MCV: 93.6 fL (ref 80.0–100.0)
Monocytes Absolute: 0.5 10*3/uL (ref 0.1–1.0)
Monocytes Relative: 11 %
Neutro Abs: 2.9 10*3/uL (ref 1.7–7.7)
Neutrophils Relative %: 62 %
Platelets: 236 10*3/uL (ref 150–400)
RBC: 3.77 MIL/uL — ABNORMAL LOW (ref 3.87–5.11)
RDW: 17.2 % — ABNORMAL HIGH (ref 11.5–15.5)
WBC: 4.7 10*3/uL (ref 4.0–10.5)
nRBC: 0 % (ref 0.0–0.2)

## 2020-11-15 MED ORDER — FAMOTIDINE IN NACL 20-0.9 MG/50ML-% IV SOLN
INTRAVENOUS | Status: AC
  Filled 2020-11-15: qty 50

## 2020-11-15 MED ORDER — DIPHENHYDRAMINE HCL 50 MG/ML IJ SOLN
INTRAMUSCULAR | Status: AC
  Filled 2020-11-15: qty 1

## 2020-11-15 MED ORDER — FAMOTIDINE IN NACL 20-0.9 MG/50ML-% IV SOLN
20.0000 mg | Freq: Once | INTRAVENOUS | Status: AC | PRN
  Administered 2020-11-15: 20 mg via INTRAVENOUS

## 2020-11-15 MED ORDER — SODIUM CHLORIDE 0.9 % IV SOLN
Freq: Once | INTRAVENOUS | Status: DC | PRN
  Filled 2020-11-15: qty 250

## 2020-11-15 MED ORDER — LORAZEPAM 2 MG/ML IJ SOLN
INTRAMUSCULAR | Status: AC
  Filled 2020-11-15: qty 1

## 2020-11-15 MED ORDER — DIPHENHYDRAMINE HCL 50 MG/ML IJ SOLN
25.0000 mg | Freq: Once | INTRAMUSCULAR | Status: AC
  Administered 2020-11-15: 25 mg via INTRAVENOUS

## 2020-11-15 MED ORDER — LORAZEPAM 2 MG/ML IJ SOLN
1.0000 mg | Freq: Once | INTRAMUSCULAR | Status: AC
  Administered 2020-11-15: 1 mg via INTRAVENOUS

## 2020-11-15 MED ORDER — PACLITAXEL CHEMO INJECTION 300 MG/50ML
45.0000 mg/m2 | Freq: Once | INTRAVENOUS | Status: AC
  Administered 2020-11-15: 96 mg via INTRAVENOUS
  Filled 2020-11-15: qty 16

## 2020-11-15 MED ORDER — FAMOTIDINE IN NACL 20-0.9 MG/50ML-% IV SOLN
20.0000 mg | Freq: Once | INTRAVENOUS | Status: AC
Start: 2020-11-15 — End: 2020-11-15
  Administered 2020-11-15: 20 mg via INTRAVENOUS

## 2020-11-15 MED ORDER — METHYLPREDNISOLONE SODIUM SUCC 125 MG IJ SOLR
125.0000 mg | Freq: Once | INTRAMUSCULAR | Status: AC | PRN
  Administered 2020-11-15: 125 mg via INTRAVENOUS

## 2020-11-15 MED ORDER — HEPARIN SOD (PORK) LOCK FLUSH 100 UNIT/ML IV SOLN
500.0000 [IU] | Freq: Once | INTRAVENOUS | Status: AC | PRN
Start: 2020-11-15 — End: 2020-11-15
  Administered 2020-11-15: 500 [IU]
  Filled 2020-11-15: qty 5

## 2020-11-15 MED ORDER — DIPHENHYDRAMINE HCL 50 MG/ML IJ SOLN
25.0000 mg | Freq: Once | INTRAMUSCULAR | Status: AC | PRN
  Administered 2020-11-15: 50 mg via INTRAVENOUS

## 2020-11-15 MED ORDER — SODIUM CHLORIDE 0.9% FLUSH
10.0000 mL | INTRAVENOUS | Status: DC | PRN
  Administered 2020-11-15: 10 mL
  Filled 2020-11-15: qty 10

## 2020-11-15 MED ORDER — SODIUM CHLORIDE 0.9 % IV SOLN
Freq: Once | INTRAVENOUS | Status: AC
  Filled 2020-11-15: qty 250

## 2020-11-15 MED ORDER — SODIUM CHLORIDE 0.9 % IV SOLN
10.0000 mg | Freq: Once | INTRAVENOUS | Status: AC
  Administered 2020-11-15: 10 mg via INTRAVENOUS
  Filled 2020-11-15: qty 10

## 2020-11-15 NOTE — Progress Notes (Signed)
2 RNs Verified : Drug appearance and integrity are intact; Drug expiration date and time- drug has not expired; Pump settings are accurate, if applicable  

## 2020-11-15 NOTE — Progress Notes (Signed)
1313 pt face red, c/o difficulty breathing, chest tightness. Taxol stopped, NS wide open to gravity Applied O2 3L via [place] . 1314 Solumedrol 125 IVP 1314 Benadryl 50mg  IVP 1315 MD at chair side to assess pt. VS stable 109/54 POA2 93% R-15 HR-65 1316 VO Pepcid 20mg  IV- now  1318 VSS  BP 117/62  PAO2 100% HR -57 1319 Pt denies difficulty breathing, talking and laughing about a bird video she is able to show to MD. No redness to face. MD advised to wait 58mintutes and rechallenge pt at half the rate/hr. 1332  Taxol restarted at 125mg /hr VSS. Pt resting with eyes closed.   .2 RNs Verified : Drug appearance and integrity are intact; Drug expiration date and time- drug has not expired; Pump settings are accurate, if applicable

## 2020-11-15 NOTE — Patient Instructions (Signed)
Implanted Port Insertion, Care After This sheet gives you information about how to care for yourself after your procedure. Your health care provider may also give you more specific instructions. If you have problems or questions, contact your health care provider. What can I expect after the procedure? After the procedure, it is common to have:  Discomfort at the port insertion site.  Bruising on the skin over the port. This should improve over 3-4 days. Follow these instructions at home: Port care  After your port is placed, you will get a manufacturer's information card. The card has information about your port. Keep this card with you at all times.  Take care of the port as told by your health care provider. Ask your health care provider if you or a family member can get training for taking care of the port at home. A home health care nurse may also take care of the port.  Make sure to remember what type of port you have. Incision care  Follow instructions from your health care provider about how to take care of your port insertion site. Make sure you: ? Wash your hands with soap and water before and after you change your bandage (dressing). If soap and water are not available, use hand sanitizer. ? Change your dressing as told by your health care provider. ? Leave stitches (sutures), skin glue, or adhesive strips in place. These skin closures may need to stay in place for 2 weeks or longer. If adhesive strip edges start to loosen and curl up, you may trim the loose edges. Do not remove adhesive strips completely unless your health care provider tells you to do that.  Check your port insertion site every day for signs of infection. Check for: ? Redness, swelling, or pain. ? Fluid or blood. ? Warmth. ? Pus or a bad smell.      Activity  Return to your normal activities as told by your health care provider. Ask your health care provider what activities are safe for you.  Do not  lift anything that is heavier than 10 lb (4.5 kg), or the limit that you are told, until your health care provider says that it is safe. General instructions  Take over-the-counter and prescription medicines only as told by your health care provider.  Do not take baths, swim, or use a hot tub until your health care provider approves. Ask your health care provider if you may take showers. You may only be allowed to take sponge baths.  Do not drive for 24 hours if you were given a sedative during your procedure.  Wear a medical alert bracelet in case of an emergency. This will tell any health care providers that you have a port.  Keep all follow-up visits as told by your health care provider. This is important. Contact a health care provider if:  You cannot flush your port with saline as directed, or you cannot draw blood from the port.  You have a fever or chills.  You have redness, swelling, or pain around your port insertion site.  You have fluid or blood coming from your port insertion site.  Your port insertion site feels warm to the touch.  You have pus or a bad smell coming from the port insertion site. Get help right away if:  You have chest pain or shortness of breath.  You have bleeding from your port that you cannot control. Summary  Take care of the port as told by your   health care provider. Keep the manufacturer's information card with you at all times.  Change your dressing as told by your health care provider.  Contact a health care provider if you have a fever or chills or if you have redness, swelling, or pain around your port insertion site.  Keep all follow-up visits as told by your health care provider. This information is not intended to replace advice given to you by your health care provider. Make sure you discuss any questions you have with your health care provider. Document Revised: 02/15/2018 Document Reviewed: 02/15/2018 Elsevier Patient Education   2021 [employer]  

## 2020-11-15 NOTE — Progress Notes (Signed)
1313 pt face red, c/o difficulty breathing, chest tightness. Taxol stopped, NS wide open to gravity Applied O2 3L via [place] . 1314 Solumedrol 125 IVP 1314 Benadryl 50mg  IVP 1315 MD at chair side to assess pt. VS stable 109/54 POA2 93% R-15 HR-65 1316 VO Pepcid 20mg  IV- now  1318 VSS  BP 117/62  PAO2 100% HR -57 1319 Pt denies difficulty breathing, talking and laughing about a bird video she is able to show to MD. No redness to face. MD advised to wait 20mintutes and rechallenge pt at half the rate/hr. 1332  Taxol restarted at 125mg /hr VSS. Pt resting with eyes closed.

## 2020-11-15 NOTE — Patient Instructions (Signed)
[facility] Discharge Instructions for Patients Receiving Chemotherapy  Today you received the following chemotherapy agents Paclitaxel  To help prevent nausea and vomiting after your treatment, we encourage you to take your nausea medication as directed   If you develop nausea and vomiting that is not controlled by your nausea medication, call the clinic.   BELOW ARE SYMPTOMS THAT SHOULD BE REPORTED IMMEDIATELY:  *FEVER GREATER THAN 100.5 F  *CHILLS WITH OR WITHOUT FEVER  NAUSEA AND VOMITING THAT IS NOT CONTROLLED WITH YOUR NAUSEA MEDICATION  *UNUSUAL SHORTNESS OF BREATH  *UNUSUAL BRUISING OR BLEEDING  TENDERNESS IN MOUTH AND THROAT WITH OR WITHOUT PRESENCE OF ULCERS  *URINARY PROBLEMS  *BOWEL PROBLEMS  UNUSUAL RASH Items with * indicate a potential emergency and should be followed up as soon as possible.  Feel free to call the clinic should you have any questions or concerns. The clinic phone number is [phone].  Please show the CHEMO ALERT CARD at check-in to the Emergency Department and triage nurse.   

## 2020-11-18 ENCOUNTER — Telehealth: Payer: Self-pay | Admitting: Hematology and Oncology

## 2020-11-18 NOTE — Telephone Encounter (Signed)
R/s appts per 4/15 sch msg. Called pt, no answer. Left msg with appt date and time.

## 2020-11-22 ENCOUNTER — Inpatient Hospital Stay: Payer: BC Managed Care – PPO | Admitting: Medical

## 2020-11-22 ENCOUNTER — Inpatient Hospital Stay: Payer: BC Managed Care – PPO

## 2020-11-22 ENCOUNTER — Other Ambulatory Visit: Payer: Self-pay

## 2020-11-22 VITALS — BP 121/70 | HR 57 | Temp 97.8°F | Resp 20 | Ht 66.0 in | Wt 209.0 lb

## 2020-11-22 DIAGNOSIS — Z170 Estrogen receptor positive status [ER+]: Secondary | ICD-10-CM

## 2020-11-22 DIAGNOSIS — D701 Agranulocytosis secondary to cancer chemotherapy: Secondary | ICD-10-CM | POA: Diagnosis not present

## 2020-11-22 DIAGNOSIS — C50412 Malignant neoplasm of upper-outer quadrant of left female breast: Secondary | ICD-10-CM | POA: Diagnosis not present

## 2020-11-22 DIAGNOSIS — Z8041 Family history of malignant neoplasm of ovary: Secondary | ICD-10-CM | POA: Diagnosis not present

## 2020-11-22 DIAGNOSIS — Z803 Family history of malignant neoplasm of breast: Secondary | ICD-10-CM | POA: Diagnosis not present

## 2020-11-22 DIAGNOSIS — Z5111 Encounter for antineoplastic chemotherapy: Secondary | ICD-10-CM | POA: Diagnosis not present

## 2020-11-22 DIAGNOSIS — Z79899 Other long term (current) drug therapy: Secondary | ICD-10-CM | POA: Diagnosis not present

## 2020-11-22 DIAGNOSIS — R5383 Other fatigue: Secondary | ICD-10-CM | POA: Diagnosis not present

## 2020-11-22 DIAGNOSIS — I951 Orthostatic hypotension: Secondary | ICD-10-CM | POA: Diagnosis not present

## 2020-11-22 DIAGNOSIS — E860 Dehydration: Secondary | ICD-10-CM | POA: Diagnosis not present

## 2020-11-22 DIAGNOSIS — T451X5A Adverse effect of antineoplastic and immunosuppressive drugs, initial encounter: Secondary | ICD-10-CM | POA: Diagnosis not present

## 2020-11-22 DIAGNOSIS — Z801 Family history of malignant neoplasm of trachea, bronchus and lung: Secondary | ICD-10-CM | POA: Diagnosis not present

## 2020-11-22 LAB — CMP (CANCER CENTER ONLY)
ALT: 45 U/L — ABNORMAL HIGH (ref 0–44)
AST: 36 U/L (ref 15–41)
Albumin: 3.6 g/dL (ref 3.5–5.0)
Alkaline Phosphatase: 68 U/L (ref 38–126)
Anion gap: 6 (ref 5–15)
BUN: 10 mg/dL (ref 6–20)
CO2: 24 mmol/L (ref 22–32)
Calcium: 8.4 mg/dL — ABNORMAL LOW (ref 8.9–10.3)
Chloride: 110 mmol/L (ref 98–111)
Creatinine: 0.71 mg/dL (ref 0.44–1.00)
GFR, Estimated: >60 mL/min (ref >60)
Glucose, Bld: 110 mg/dL — ABNORMAL HIGH (ref 70–99)
Potassium: 4.1 mmol/L (ref 3.5–5.1)
Sodium: 140 mmol/L (ref 135–145)
Total Bilirubin: 0.3 mg/dL (ref 0.3–1.2)
Total Protein: 6.2 g/dL — ABNORMAL LOW (ref 6.5–8.1)

## 2020-11-22 LAB — CBC WITH DIFFERENTIAL/PLATELET
Abs Immature Granulocytes: 0.02 10*3/uL (ref 0.00–0.07)
Basophils Absolute: 0.1 10*3/uL (ref 0.0–0.1)
Basophils Relative: 2 %
Eosinophils Absolute: 0.1 10*3/uL (ref 0.0–0.5)
Eosinophils Relative: 3 %
HCT: 35 % — ABNORMAL LOW (ref 36.0–46.0)
Hemoglobin: 11.6 g/dL — ABNORMAL LOW (ref 12.0–15.0)
Immature Granulocytes: 1 %
Lymphocytes Relative: 22 %
Lymphs Abs: 0.8 10*3/uL (ref 0.7–4.0)
MCH: 31.8 pg (ref 26.0–34.0)
MCHC: 33.1 g/dL (ref 30.0–36.0)
MCV: 95.9 fL (ref 80.0–100.0)
Monocytes Absolute: 0.4 10*3/uL (ref 0.1–1.0)
Monocytes Relative: 11 %
Neutro Abs: 2.4 10*3/uL (ref 1.7–7.7)
Neutrophils Relative %: 61 %
Platelets: 212 10*3/uL (ref 150–400)
RBC: 3.65 MIL/uL — ABNORMAL LOW (ref 3.87–5.11)
RDW: 16.8 % — ABNORMAL HIGH (ref 11.5–15.5)
WBC: 3.8 10*3/uL — ABNORMAL LOW (ref 4.0–10.5)
nRBC: 0 % (ref 0.0–0.2)

## 2020-11-22 MED ORDER — LORAZEPAM 2 MG/ML IJ SOLN
1.0000 mg | Freq: Once | INTRAMUSCULAR | Status: AC
  Administered 2020-11-22: 1 mg via INTRAVENOUS

## 2020-11-22 MED ORDER — SODIUM CHLORIDE 0.9 % IV SOLN
Freq: Once | INTRAVENOUS | Status: AC
Start: 2020-11-22 — End: 2020-11-22
  Filled 2020-11-22: qty 250

## 2020-11-22 MED ORDER — LORAZEPAM 2 MG/ML IJ SOLN
INTRAMUSCULAR | Status: AC
  Filled 2020-11-22: qty 1

## 2020-11-22 MED ORDER — COLD PACK MISC ONCOLOGY
1.0000 | Freq: Once | Status: DC | PRN
  Filled 2020-11-22: qty 1

## 2020-11-22 MED ORDER — METHYLPREDNISOLONE SODIUM SUCC 125 MG IJ SOLR
65.0000 mg | Freq: Once | INTRAMUSCULAR | Status: AC
  Administered 2020-11-22: 65 mg via INTRAVENOUS

## 2020-11-22 MED ORDER — FAMOTIDINE IN NACL 20-0.9 MG/50ML-% IV SOLN
INTRAVENOUS | Status: AC
  Filled 2020-11-22: qty 50

## 2020-11-22 MED ORDER — DIPHENHYDRAMINE HCL 50 MG/ML IJ SOLN
INTRAMUSCULAR | Status: AC
  Filled 2020-11-22: qty 1

## 2020-11-22 MED ORDER — DIPHENHYDRAMINE HCL 50 MG/ML IJ SOLN
25.0000 mg | Freq: Once | INTRAMUSCULAR | Status: AC
  Administered 2020-11-22: 25 mg via INTRAVENOUS

## 2020-11-22 MED ORDER — OMEPRAZOLE 20 MG PO CPDR: 20.0000 mg | DELAYED_RELEASE_CAPSULE | Freq: Every day | ORAL | 3 refills | Status: DC

## 2020-11-22 MED ORDER — METHYLPREDNISOLONE SODIUM SUCC 125 MG IJ SOLR
INTRAMUSCULAR | Status: AC
  Filled 2020-11-22: qty 2

## 2020-11-22 MED ORDER — SODIUM CHLORIDE 0.9% FLUSH
10.0000 mL | INTRAVENOUS | Status: DC | PRN
  Administered 2020-11-22: 10 mL
  Filled 2020-11-22: qty 10

## 2020-11-22 MED ORDER — SODIUM CHLORIDE 0.9 % IV SOLN
45.0000 mg/m2 | Freq: Once | INTRAVENOUS | Status: AC
  Administered 2020-11-22: 96 mg via INTRAVENOUS
  Filled 2020-11-22: qty 16

## 2020-11-22 MED ORDER — HEPARIN SOD (PORK) LOCK FLUSH 100 UNIT/ML IV SOLN
500.0000 [IU] | Freq: Once | INTRAVENOUS | Status: AC | PRN
  Administered 2020-11-22: 500 [IU]
  Filled 2020-11-22: qty 5

## 2020-11-22 MED ORDER — SODIUM CHLORIDE 0.9 % IV SOLN
10.0000 mg | Freq: Once | INTRAVENOUS | Status: AC
  Administered 2020-11-22: 10 mg via INTRAVENOUS
  Filled 2020-11-22: qty 10

## 2020-11-22 MED ORDER — FAMOTIDINE IN NACL 20-0.9 MG/50ML-% IV SOLN
20.0000 mg | Freq: Once | INTRAVENOUS | Status: AC
  Administered 2020-11-22: 20 mg via INTRAVENOUS

## 2020-11-22 NOTE — Patient Instructions (Signed)
[facility] MEDICAL ONCOLOGY  Discharge Instructions: °Thank you for choosing [facility] to provide your oncology and hematology care.  ° °If you have a lab appointment with the Cancer Center, please go directly to the Cancer Center and check in at the registration area. °  °Wear comfortable clothing and clothing appropriate for easy access to any Portacath or PICC line.  ° °We strive to give you quality time with your provider. You may need to reschedule your appointment if you arrive late (15 or more minutes).  Arriving late affects you and other patients whose appointments are after yours.  Also, if you miss three or more appointments without notifying the office, you may be dismissed from the clinic at the provider’s discretion.    °  °For prescription refill requests, have your pharmacy contact our office and allow 72 hours for refills to be completed.   ° °Today you received the following chemotherapy and/or immunotherapy agents: Taxol   °  °To help prevent nausea and vomiting after your treatment, we encourage you to take your nausea medication as directed. ° °BELOW ARE SYMPTOMS THAT SHOULD BE REPORTED IMMEDIATELY: °*FEVER GREATER THAN 100.4 F (38 °C) OR HIGHER °*CHILLS OR SWEATING °*NAUSEA AND VOMITING THAT IS NOT CONTROLLED WITH YOUR NAUSEA MEDICATION °*UNUSUAL SHORTNESS OF BREATH °*UNUSUAL BRUISING OR BLEEDING °*URINARY PROBLEMS (pain or burning when urinating, or frequent urination) °*BOWEL PROBLEMS (unusual diarrhea, constipation, pain near the anus) °TENDERNESS IN MOUTH AND THROAT WITH OR WITHOUT PRESENCE OF ULCERS (sore throat, sores in mouth, or a toothache) °UNUSUAL RASH, SWELLING OR PAIN  °UNUSUAL VAGINAL DISCHARGE OR ITCHING  ° °Items with * indicate a potential emergency and should be followed up as soon as possible or go to the Emergency Department if any problems should occur. ° °Please show the CHEMOTHERAPY ALERT CARD or IMMUNOTHERAPY ALERT CARD at check-in to the  Emergency Department and triage nurse. ° °Should you have questions after your visit or need to cancel or reschedule your appointment, please contact [facility]ICAL ONCOLOG[facility]  Dept: [phone]  and follow the prompts.  Office hours are 8:00 a.m. to 4:30 p.m. Monday - Friday. Please note that voicemails left after 4:00 p.m. may not be returned until the following business day.  We are closed weekends and major holidays. You have access to a nurse at all times for urgent questions. Please call the main number to the clinic Dept: [phone] and follow the prompts. ° ° °For any non-urgent questions, you may also contact your provider using MyChart. We now offer e-Visits for anyone 18 and older to request care online for non-urgent symptoms. For details visit [email or web address]. °  °Also download the MyChart app! Go to the app store, search "MyChart", open the app, select [facility], and log in with your MyChart username and password. ° °Due to Covid, a mask is required upon entering the hospital/clinic. If you do not have a mask, one will be given to you upon arrival. For doctor visits, patients may have 1 support person aged 18 or older with them. For treatment visits, patients cannot have anyone with them due to current Covid guidelines and our immunocompromised population.  ° °

## 2020-11-22 NOTE — Patient Instructions (Signed)

## 2020-11-22 NOTE — Progress Notes (Signed)
Symptoms Management Clinic Progress Note   Tracy Dyer [id number] 12/02/1970 50 y.o.  Tracy Dyer Tracy Dyer is managed by Dr. Nicholas Dyer  Actively treated with chemotherapy/immunotherapy/hormonal therapy: yes  Current therapy: Paclitaxel  Last treated: 11/15/2020 (cycle 3, day 1)  Next scheduled appointment with provider: 11/29/2020  Assessment: Plan:    Malignant neoplasm of upper-outer quadrant of left breast in female, estrogen receptor positive ([place])   ER positive malignant neoplasm of the left breast: [name] presents to clinic today for consideration of cycle 4, day 1 of paclitaxel.  Solu-Medrol and Pepcid have been added to her premeds as she had a reaction last week.  We will proceed with her treatment today and will have her return to clinic on 11/29/2020 for ongoing care.  Please see After Visit Summary for patient specific instructions.  Future Appointments  Date Time Provider [place]  11/29/2020  9:00 AM [facility] [place] None  11/29/2020  9:30 AM [name], NP [facility] None  11/29/2020 10:30 AM [facility] INFUSION [facility] None  12/06/2020  9:15 AM [facility] [place] FLUSH [facility] None  12/06/2020 10:00 AM [facility] INFUSION [facility] None    No orders of the defined types were placed in this encounter.      Subjective:   Patient ID:  Tracy Dyer is a 50 y.o. (DOB 06-Feb-1971) female.  Chief Complaint: No chief complaint on file.   HPI Tracy Dyer  is a 50 y.o. female with a diagnosis of an ER positive malignant neoplasm of the left breast.  She is managed by Dr. Nicholas Dyer and presents to clinic today for consideration of cycle 4, day 1 of paclitaxel.  She had a reaction to paclitaxel last week and had erythema and shortness of breath.  Solu-Medrol and Pepcid were given and she was able to complete her infusion without any additional issues of concern.  These have been added  to her premeds for today.  She request a refill of Prilosec today.  She states that she is doing well with no issues of concern at this point she denies fevers, chills, sweats, nausea, vomiting, constipation, or diarrhea.   Medications: I have reviewed the patient's current medications.  Allergies:  Allergies  Allergen Reactions  . Sulfa Antibiotics     Other reaction(s): Rash, urticarial  . Hydromorphone Anxiety and Other (See Comments)  . Oxycodone Anxiety  . Propoxyphene Rash and Anxiety  . Sulfamethoxazole Rash    Feel sun burn    Past Medical History:  Diagnosis Date  . Anxiety   . Cancer [facility])    breast  . Family history of breast cancer   . Family history of lung cancer   . Family history of ovarian cancer   . GERD (gastroesophageal reflux disease)    diet controlled  . Seasonal allergies     Past Surgical History:  Procedure Laterality Date  . BREAST BIOPSY Left 2019   x 2 biopsy  . BREAST EXCISIONAL BIOPSY Left   . CHOLECYSTECTOMY  2008  . PORTACATH PLACEMENT N/A 09/05/2020   Procedure: INSERTION PORT-A-CATH;  Surgeon: [name], MD;  Location: [place];  Service: General;  Laterality: N/A;  PEC BLOCK  . WISDOM TOOTH EXTRACTION      Family History  Problem Relation Age of Onset  . Eczema Father   . Allergic rhinitis Brother   . Breast cancer Mother        in 68's  . Cancer  Maternal Uncle        unk type  . Lung cancer Maternal Grandfather 68  . Heart Problems Paternal Grandfather   . Melanoma Cousin   . Ovarian cancer Paternal Great-grandmother   . Cancer Paternal Great-grandmother        GYN cancer, possibly ovarian?  . Cancer Maternal Aunt        half aunts/uncles x5- rare cancers    Social History   Socioeconomic History  . Marital status: Married    Spouse name: Not on file  . Number of children: Not on file  . Years of education: Not on file  . Highest education level: Not on file  Occupational History  . Not on file  Tobacco Use   . Smoking status: Never Smoker  . Smokeless tobacco: Never Used  Vaping Use  . Vaping Use: Never used  Substance and Sexual Activity  . Alcohol use: Not Currently  . Drug use: Never  . Sexual activity: Yes    Birth control/protection: None  Other Topics Concern  . Not on file  Social History Narrative  . Not on file   Social Determinants of Health   Financial Resource Strain: Not on file  Food Insecurity: No Food Insecurity  . Worried About Charity fundraiser in the Last Year: Never true  . Ran Out of Food in the Last Year: Never true  Transportation Needs: No Transportation Needs  . Lack of Transportation (Medical): No  . Lack of Transportation (Non-Medical): No  Physical Activity: Not on file  Stress: Not on file  Social Connections: Not on file  Intimate Partner Violence: Not on file    Past Medical History, Surgical history, Social history, and Family history were reviewed and updated as appropriate.   Please see review of systems for further details on the patient's review from today.   Review of Systems:  Review of Systems  Constitutional: Negative for chills, diaphoresis and fever.  HENT: Negative for trouble swallowing and voice change.   Respiratory: Negative for cough, chest tightness, shortness of breath and wheezing.   Cardiovascular: Negative for chest pain and palpitations.  Gastrointestinal: Negative for abdominal pain, constipation, diarrhea, nausea and vomiting.  Musculoskeletal: Negative for back pain and myalgias.  Neurological: Negative for dizziness, light-headedness and headaches.    Objective:   Physical Exam:  BP 121/70 (BP Location: Right Arm, Patient Position: Sitting)   Pulse (!) 57   Temp 97.8 F (36.6 C) (Tympanic)   Resp 20   Ht 5\' 6"  (1.676 m)   Wt 209 lb (94.8 kg)   SpO2 100%   BMI 33.73 kg/m  ECOG: 0  Physical Exam Constitutional:      General: She is not in acute distress.    Appearance: She is not diaphoretic.  HENT:      Head: Normocephalic and atraumatic.  Eyes:     General: No scleral icterus.       Right eye: No discharge.        Left eye: No discharge.  Cardiovascular:     Rate and Rhythm: Normal rate and regular rhythm.     Heart sounds: Normal heart sounds. No murmur heard. No friction rub. No gallop.   Pulmonary:     Effort: Pulmonary effort is normal. No respiratory distress.     Breath sounds: Normal breath sounds. No wheezing or rales.  Skin:    General: Skin is warm and dry.     Findings: No erythema or rash.  Neurological:     Mental Status: She is alert.     Coordination: Coordination normal.     Gait: Gait normal.  Psychiatric:        Mood and Affect: Mood normal.        Behavior: Behavior normal.        Thought Content: Thought content normal.        Judgment: Judgment normal.     Lab Review:     Component Value Date/Time   NA 139 11/27/2020 1622   K 3.7 11/27/2020 1622   CL 106 11/27/2020 1622   CO2 25 11/27/2020 1622   GLUCOSE 123 (H) 11/27/2020 1622   BUN 11 11/27/2020 1622   CREATININE 0.92 11/27/2020 1622   CALCIUM 9.4 11/27/2020 1622   PROT 7.0 11/27/2020 1622   ALBUMIN 4.2 11/27/2020 1622   AST 38 11/27/2020 1622   ALT 53 (H) 11/27/2020 1622   ALKPHOS 70 11/27/2020 1622   BILITOT 0.5 11/27/2020 1622   GFRNONAA >60 11/27/2020 1622       Component Value Date/Time   WBC 3.7 (L) 11/27/2020 1622   RBC 4.14 11/27/2020 1622   HGB 13.2 11/27/2020 1622   HGB 13.7 09/13/2020 1048   HCT 38.5 11/27/2020 1622   PLT 243 11/27/2020 1622   PLT 176 09/13/2020 1048   MCV 93.0 11/27/2020 1622   MCH 31.9 11/27/2020 1622   MCHC 34.3 11/27/2020 1622   RDW 16.0 (H) 11/27/2020 1622   LYMPHSABS 1.1 11/27/2020 1622   MONOABS 0.3 11/27/2020 1622   EOSABS 0.1 11/27/2020 1622   BASOSABS 0.1 11/27/2020 1622   -------------------------------  Imaging from last 24 hours (if applicable):  Radiology interpretation: No results found.    OK to treat.  Sandi Mealy, MHS,  PA-C

## 2020-11-22 NOTE — Progress Notes (Signed)
Spoke to [employer], PA about patient stating she had a reaction last treatment. New order for Solu-Medrol 65mg s IVP.

## 2020-11-25 ENCOUNTER — Encounter: Payer: Self-pay | Admitting: *Deleted

## 2020-11-27 ENCOUNTER — Other Ambulatory Visit: Payer: Self-pay | Admitting: *Deleted

## 2020-11-27 ENCOUNTER — Other Ambulatory Visit: Payer: Self-pay

## 2020-11-27 ENCOUNTER — Inpatient Hospital Stay (HOSPITAL_BASED_OUTPATIENT_CLINIC_OR_DEPARTMENT_OTHER): Payer: BC Managed Care – PPO | Admitting: Medical

## 2020-11-27 VITALS — BP 106/65 | HR 64 | Temp 98.3°F | Resp 18 | Wt 204.0 lb

## 2020-11-27 DIAGNOSIS — C50412 Malignant neoplasm of upper-outer quadrant of left female breast: Secondary | ICD-10-CM

## 2020-11-27 DIAGNOSIS — Z79899 Other long term (current) drug therapy: Secondary | ICD-10-CM | POA: Diagnosis not present

## 2020-11-27 DIAGNOSIS — T451X5A Adverse effect of antineoplastic and immunosuppressive drugs, initial encounter: Secondary | ICD-10-CM | POA: Diagnosis not present

## 2020-11-27 DIAGNOSIS — Z95828 Presence of other vascular implants and grafts: Secondary | ICD-10-CM | POA: Diagnosis not present

## 2020-11-27 DIAGNOSIS — E860 Dehydration: Secondary | ICD-10-CM | POA: Diagnosis not present

## 2020-11-27 DIAGNOSIS — Z8041 Family history of malignant neoplasm of ovary: Secondary | ICD-10-CM | POA: Diagnosis not present

## 2020-11-27 DIAGNOSIS — R5383 Other fatigue: Secondary | ICD-10-CM | POA: Diagnosis not present

## 2020-11-27 DIAGNOSIS — Z170 Estrogen receptor positive status [ER+]: Secondary | ICD-10-CM

## 2020-11-27 DIAGNOSIS — I951 Orthostatic hypotension: Secondary | ICD-10-CM | POA: Diagnosis not present

## 2020-11-27 DIAGNOSIS — Z5111 Encounter for antineoplastic chemotherapy: Secondary | ICD-10-CM | POA: Diagnosis not present

## 2020-11-27 DIAGNOSIS — Z803 Family history of malignant neoplasm of breast: Secondary | ICD-10-CM | POA: Diagnosis not present

## 2020-11-27 DIAGNOSIS — D701 Agranulocytosis secondary to cancer chemotherapy: Secondary | ICD-10-CM | POA: Diagnosis not present

## 2020-11-27 DIAGNOSIS — R110 Nausea: Secondary | ICD-10-CM

## 2020-11-27 DIAGNOSIS — Z801 Family history of malignant neoplasm of trachea, bronchus and lung: Secondary | ICD-10-CM | POA: Diagnosis not present

## 2020-11-27 LAB — CBC WITH DIFFERENTIAL/PLATELET
Abs Immature Granulocytes: 0.01 10*3/uL (ref 0.00–0.07)
Basophils Absolute: 0.1 10*3/uL (ref 0.0–0.1)
Basophils Relative: 1 %
Eosinophils Absolute: 0.1 10*3/uL (ref 0.0–0.5)
Eosinophils Relative: 3 %
HCT: 38.5 % (ref 36.0–46.0)
Hemoglobin: 13.2 g/dL (ref 12.0–15.0)
Immature Granulocytes: 0 %
Lymphocytes Relative: 30 %
Lymphs Abs: 1.1 10*3/uL (ref 0.7–4.0)
MCH: 31.9 pg (ref 26.0–34.0)
MCHC: 34.3 g/dL (ref 30.0–36.0)
MCV: 93 fL (ref 80.0–100.0)
Monocytes Absolute: 0.3 10*3/uL (ref 0.1–1.0)
Monocytes Relative: 8 %
Neutro Abs: 2.1 10*3/uL (ref 1.7–7.7)
Neutrophils Relative %: 58 %
Platelets: 243 10*3/uL (ref 150–400)
RBC: 4.14 MIL/uL (ref 3.87–5.11)
RDW: 16 % — ABNORMAL HIGH (ref 11.5–15.5)
WBC: 3.7 10*3/uL — ABNORMAL LOW (ref 4.0–10.5)
nRBC: 0 % (ref 0.0–0.2)

## 2020-11-27 LAB — CMP (CANCER CENTER ONLY)
ALT: 53 U/L — ABNORMAL HIGH (ref 0–44)
AST: 38 U/L (ref 15–41)
Albumin: 4.2 g/dL (ref 3.5–5.0)
Alkaline Phosphatase: 70 U/L (ref 38–126)
Anion gap: 8 (ref 5–15)
BUN: 11 mg/dL (ref 6–20)
CO2: 25 mmol/L (ref 22–32)
Calcium: 9.4 mg/dL (ref 8.9–10.3)
Chloride: 106 mmol/L (ref 98–111)
Creatinine: 0.92 mg/dL (ref 0.44–1.00)
GFR, Estimated: >60 mL/min (ref >60)
Glucose, Bld: 123 mg/dL — ABNORMAL HIGH (ref 70–99)
Potassium: 3.7 mmol/L (ref 3.5–5.1)
Sodium: 139 mmol/L (ref 135–145)
Total Bilirubin: 0.5 mg/dL (ref 0.3–1.2)
Total Protein: 7 g/dL (ref 6.5–8.1)

## 2020-11-27 LAB — MAGNESIUM: Magnesium: 2.1 mg/dL (ref 1.7–2.4)

## 2020-11-27 MED ORDER — SODIUM CHLORIDE 0.9% FLUSH
10.0000 mL | Freq: Once | INTRAVENOUS | Status: AC
  Administered 2020-11-27: 10 mL
  Filled 2020-11-27: qty 10

## 2020-11-27 MED ORDER — LORAZEPAM 0.5 MG PO TABS
ORAL_TABLET | ORAL | 0 refills | Status: DC
Start: 2020-11-27 — End: 2021-01-06

## 2020-11-27 MED ORDER — HEPARIN SOD (PORK) LOCK FLUSH 100 UNIT/ML IV SOLN
500.0000 [IU] | Freq: Once | INTRAVENOUS | Status: AC
  Administered 2020-11-27: 500 [IU]
  Filled 2020-11-27: qty 5

## 2020-11-27 MED ORDER — SODIUM CHLORIDE 0.9 % IV SOLN
INTRAVENOUS | Status: AC
  Filled 2020-11-27 (×2): qty 250

## 2020-11-27 NOTE — Patient Instructions (Signed)
Dehydration, Adult Dehydration is condition in which there is not enough water or other fluids in the body. This happens when a person loses more fluids than he or she takes in. Important body parts cannot work right without the right amount of fluids. Any loss of fluids from the body can cause dehydration. Dehydration can be mild, worse, or very bad. It should be treated right away to keep it from getting very bad. What are the causes? This condition may be caused by:  Conditions that cause loss of water or other fluids, such as: ? Watery poop (diarrhea). ? Vomiting. ? Sweating a lot. ? Peeing (urinating) a lot.  Not drinking enough fluids, especially when you: ? Are ill. ? Are doing things that take a lot of energy to do.  Other illnesses and conditions, such as fever or infection.  Certain medicines, such as medicines that take extra fluid out of the body (diuretics).  Lack of safe drinking water.  Not being able to get enough water and food. What increases the risk? The following factors may make you more likely to develop this condition:  Having a long-term (chronic) illness that has not been treated the right way, such as: ? Diabetes. ? Heart disease. ? Kidney disease.  Being 65 years of age or older.  Having a disability.  Living in a place that is high above the ground or sea (high in altitude). The thinner, dried air causes more fluid loss.  Doing exercises that put stress on your body for a long time. What are the signs or symptoms? Symptoms of dehydration depend on how bad it is. Mild or worse dehydration  Thirst.  Dry lips or dry mouth.  Feeling dizzy or light-headed, especially when you stand up from sitting.  Muscle cramps.  Your body making: ? Dark pee (urine). Pee may be the color of tea. ? Less pee than normal. ? Less tears than normal.  Headache. Very bad dehydration  Changes in skin. Skin may: ? Be cold to the touch (clammy). ? Be blotchy  or pale. ? Not go back to normal right after you lightly pinch it and let it go.  Little or no tears, pee, or sweat.  Changes in vital signs, such as: ? Fast breathing. ? Low blood pressure. ? Weak pulse. ? Pulse that is more than 100 beats a minute when you are sitting still.  Other changes, such as: ? Feeling very thirsty. ? Eyes that look hollow (sunken). ? Cold hands and feet. ? Being mixed up (confused). ? Being very tired (lethargic) or having trouble waking from sleep. ? Short-term weight loss. ? Loss of consciousness. How is this treated? Treatment for this condition depends on how bad it is. Treatment should start right away. Do not wait until your condition gets very bad. Very bad dehydration is an emergency. You will need to go to a hospital.  Mild or worse dehydration can be treated at home. You may be asked to: ? Drink more fluids. ? Drink an oral rehydration solution (ORS). This drink helps get the right amounts of fluids and salts and minerals in the blood (electrolytes).  Very bad dehydration can be treated: ? With fluids through an IV tube. ? By getting normal levels of salts and minerals in your blood. This is often done by giving salts and minerals through a tube. The tube is passed through your nose and into your stomach. ? By treating the root cause. Follow these instructions at   home: Oral rehydration solution If told by your doctor, drink an ORS:  Make an ORS. Use instructions on the package.  Start by drinking small amounts, about  cup (120 mL) every 5-10 minutes.  Slowly drink more until you have had the amount that your doctor said to have. Eating and drinking  Drink enough clear fluid to keep your pee pale yellow. If you were told to drink an ORS, finish the ORS first. Then, start slowly drinking other clear fluids. Drink fluids such as: ? Water. Do not drink only water. Doing that can make the salt (sodium) level in your body get too low. ? Water  from ice chips you suck on. ? Fruit juice that you have added water to (diluted). ? Low-calorie sports drinks.  Eat foods that have the right amounts of salts and minerals, such as: ? Bananas. ? Oranges. ? Potatoes. ? Tomatoes. ? Spinach.  Do not drink alcohol.  Avoid: ? Drinks that have a lot of sugar. These include:  High-calorie sports drinks.  Fruit juice that you did not add water to.  Soda.  Caffeine. ? Foods that are greasy or have a lot of fat or sugar.         General instructions  Take over-the-counter and prescription medicines only as told by your doctor.  Do not take salt tablets. Doing that can make the salt level in your body get too high.  Return to your normal activities as told by your doctor. Ask your doctor what activities are safe for you.  Keep all follow-up visits as told by your doctor. This is important. Contact a doctor if:  You have pain in your belly (abdomen) and the pain: ? Gets worse. ? Stays in one place.  You have a rash.  You have a stiff neck.  You get angry or annoyed (irritable) more easily than normal.  You are more tired or have a harder time waking than normal.  You feel: ? Weak or dizzy. ? Very thirsty. Get help right away if you have:  Any symptoms of very bad dehydration.  Symptoms of vomiting, such as: ? You cannot eat or drink without vomiting. ? Your vomiting gets worse or does not go away. ? Your vomit has blood or green stuff in it.  Symptoms that get worse with treatment.  A fever.  A very bad headache.  Problems with peeing or pooping (having a bowel movement), such as: ? Watery poop that gets worse or does not go away. ? Blood in your poop (stool). This may cause poop to look black and tarry. ? Not peeing in 6-8 hours. ? Peeing only a small amount of very dark pee in 6-8 hours.  Trouble breathing. These symptoms may be an emergency. Do not wait to see if the symptoms will go away. Get  medical help right away. Call your local emergency services (911 in the [place] Do not drive yourself to the hospital. Summary  Dehydration is a condition in which there is not enough water or other fluids in the body. This happens when a person loses more fluids than he or she takes in.  Treatment for this condition depends on how bad it is. Treatment should be started right away. Do not wait until your condition gets very bad.  Drink enough clear fluid to keep your pee pale yellow. If you were told to drink an oral rehydration solution (ORS), finish the ORS first. Then, start slowly drinking other clear fluids.    Take over-the-counter and prescription medicines only as told by your doctor.  Get help right away if you have any symptoms of very bad dehydration. This information is not intended to replace advice given to you by your health care provider. Make sure you discuss any questions you have with your health care provider. Document Revised: 03/02/2019 Document Reviewed: 03/02/2019 Elsevier Patient Education  2021 Elsevier Inc.  

## 2020-11-27 NOTE — Progress Notes (Signed)
Received call from pt with complaint of dizziness, fatigue, nausea, and hot flashes today.  Pt states she is not able to drink or eat due to the nausea and feels very dehydrated.  Per MD pt needing to be seen in [facility] for further evaluation with lab work.  Apt scheduled and pt verbalized understanding.

## 2020-11-28 NOTE — Progress Notes (Signed)
Symptoms Management Clinic Progress Note   Tracy Dyer [id number] 1970/11/08 50 y.o.  Tracy Dyer is managed by Dr. [name]  Actively treated with chemotherapy/immunotherapy/hormonal therapy: yes  Current therapy: Paclitaxel  Last treated: 11/22/2020 (cycle 4, day 1)  Next scheduled appointment with provider: 11/29/2020  Assessment: Plan:    Malignant neoplasm of upper-outer quadrant of left breast in female, estrogen receptor positive ([place]) - Plan: CBC with Differential, Magnesium, CANCELED: Comprehensive metabolic panel  Dehydration - Plan: CMP ([place] only), 0.9 %  sodium chloride infusion  Port-A-Cath in place - Plan: heparin lock flush 100 unit/mL, sodium chloride flush (NS) 0.9 % injection 10 mL  Nausea without vomiting - Plan: LORazepam (ATIVAN) 0.5 MG tablet   ER positive malignant neoplasm of the left breast: [name] is followed by Dr. [name] and is status post cycle 4, day 1 of paclitaxel which was dosed on 11/22/2020.  She is scheduled to be seen in follow-up next on 11/29/2020.  Dehydration: Patient was given 500 mL of normal saline over 1 hour today.  Nausea: The patient was given a prescription for lorazepam 0.5 mg sublingual twice daily as needed for nausea.  Please see After Visit Summary for patient specific instructions.  Future Appointments  Date Time Provider [place]  11/29/2020  9:00 AM [facility] [place] None  11/29/2020  9:30 AM [name], NP [facility] None  11/29/2020 10:30 AM [facility] INFUSION [facility] None  12/06/2020  9:15 AM [facility] [place] FLUSH [facility] None  12/06/2020 10:00 AM [facility] INFUSION [facility] None    Orders Placed This Encounter  Procedures  . CMP ([place] only)       Subjective:   Patient ID:  Tracy Dyer is a 50 y.o. (DOB [birth year]) female.  Chief Complaint: No chief complaint on file.   HPI Tracy Dyer  is a 50 y.o. female with a diagnosis of an ER positive malignant neoplasm of the left breast.  She is followed by Dr. [name] and is status post cycle 4, day 1 of paclitaxel which was dosed on 11/22/2020.  She presents to the clinic today with her husband.  She reports having hot flashes, pain in her sides, headache, nausea, fatigue, and an episode of chest pain which she believes could be secondary to anxiety.  She denies fevers, chills, sweats, constipation, diarrhea, or vomiting.  Medications: I have reviewed the patient's current medications.  Allergies:  Allergies  Allergen Reactions  . Sulfa Antibiotics     Other reaction(s): Rash, urticarial  . Hydromorphone Anxiety and Other (See Comments)  . Oxycodone Anxiety  . Propoxyphene Rash and Anxiety  . Sulfamethoxazole Rash    Feel sun burn    Past Medical History:  Diagnosis Date  . Anxiety   . Cancer [facility])    breast  . Family history of breast cancer   . Family history of lung cancer   . Family history of ovarian cancer   . GERD (gastroesophageal reflux disease)    diet controlled  . Seasonal allergies     Past Surgical History:  Procedure Laterality Date  . BREAST BIOPSY Left 2019   x 2 biopsy  . BREAST EXCISIONAL BIOPSY Left   . CHOLECYSTECTOMY  2008  . PORTACATH PLACEMENT N/A 09/05/2020   Procedure: INSERTION PORT-A-CATH;  Surgeon: [name], MD;  Location: [place];  Service: General;  Laterality: N/A;  PEC BLOCK  . WISDOM TOOTH EXTRACTION  Family History  Problem Relation Age of Onset  . Eczema Father   . Allergic rhinitis Brother   . Breast cancer Mother        in 73's  . Cancer Maternal Uncle        unk type  . Lung cancer Maternal Grandfather 84  . Heart Problems Paternal Grandfather   . Melanoma Cousin   . Ovarian cancer Paternal Great-grandmother   . Cancer Paternal Great-grandmother        GYN cancer, possibly ovarian?  . Cancer Maternal Aunt        half aunts/uncles x5- rare  cancers    Social History   Socioeconomic History  . Marital status: Married    Spouse name: Not on file  . Number of children: Not on file  . Years of education: Not on file  . Highest education level: Not on file  Occupational History  . Not on file  Tobacco Use  . Smoking status: Never Smoker  . Smokeless tobacco: Never Used  Vaping Use  . Vaping Use: Never used  Substance and Sexual Activity  . Alcohol use: Not Currently  . Drug use: Never  . Sexual activity: Yes    Birth control/protection: None  Other Topics Concern  . Not on file  Social History Narrative  . Not on file   Social Determinants of Health   Financial Resource Strain: Not on file  Food Insecurity: No Food Insecurity  . Worried About Charity fundraiser in the Last Year: Never true  . Ran Out of Food in the Last Year: Never true  Transportation Needs: No Transportation Needs  . Lack of Transportation (Medical): No  . Lack of Transportation (Non-Medical): No  Physical Activity: Not on file  Stress: Not on file  Social Connections: Not on file  Intimate Partner Violence: Not on file    Past Medical History, Surgical history, Social history, and Family history were reviewed and updated as appropriate.   Please see review of systems for further details on the patient's review from today.   Review of Systems:  Review of Systems  Constitutional: Positive for fatigue. Negative for chills, diaphoresis and fever.  HENT: Negative for trouble swallowing and voice change.   Respiratory: Negative for cough, chest tightness, shortness of breath and wheezing.   Cardiovascular: Positive for chest pain. Negative for palpitations.  Gastrointestinal: Positive for abdominal pain and nausea. Negative for constipation, diarrhea and vomiting.  Musculoskeletal: Negative for back pain and myalgias.  Neurological: Negative for dizziness, light-headedness and headaches.    Objective:   Physical Exam:  BP 106/65 (BP  Location: Left Arm, Patient Position: Sitting)   Pulse 64   Temp 98.3 F (36.8 C) (Oral)   Resp 18   Wt 204 lb (92.5 kg)   SpO2 99%   BMI 32.93 kg/m  ECOG: 0  Physical Exam Constitutional:      General: She is not in acute distress.    Appearance: She is not diaphoretic.  HENT:     Head: Normocephalic and atraumatic.  Eyes:     General: No scleral icterus.       Right eye: No discharge.        Left eye: No discharge.     Conjunctiva/sclera: Conjunctivae normal.  Cardiovascular:     Rate and Rhythm: Normal rate and regular rhythm.     Heart sounds: Normal heart sounds. No murmur heard. No friction rub. No gallop.   Pulmonary:  Effort: Pulmonary effort is normal. No respiratory distress.     Breath sounds: Normal breath sounds. No wheezing or rales.  Skin:    General: Skin is warm and dry.     Findings: No erythema or rash.  Neurological:     Mental Status: She is alert.     Coordination: Coordination normal.     Gait: Gait normal.     Lab Review:     Component Value Date/Time   NA 139 11/27/2020 1622   K 3.7 11/27/2020 1622   CL 106 11/27/2020 1622   CO2 25 11/27/2020 1622   GLUCOSE 123 (H) 11/27/2020 1622   BUN 11 11/27/2020 1622   CREATININE 0.92 11/27/2020 1622   CALCIUM 9.4 11/27/2020 1622   PROT 7.0 11/27/2020 1622   ALBUMIN 4.2 11/27/2020 1622   AST 38 11/27/2020 1622   ALT 53 (H) 11/27/2020 1622   ALKPHOS 70 11/27/2020 1622   BILITOT 0.5 11/27/2020 1622   GFRNONAA >60 11/27/2020 1622       Component Value Date/Time   WBC 3.7 (L) 11/27/2020 1622   RBC 4.14 11/27/2020 1622   HGB 13.2 11/27/2020 1622   HGB 13.7 09/13/2020 1048   HCT 38.5 11/27/2020 1622   PLT 243 11/27/2020 1622   PLT 176 09/13/2020 1048   MCV 93.0 11/27/2020 1622   MCH 31.9 11/27/2020 1622   MCHC 34.3 11/27/2020 1622   RDW 16.0 (H) 11/27/2020 1622   LYMPHSABS 1.1 11/27/2020 1622   MONOABS 0.3 11/27/2020 1622   EOSABS 0.1 11/27/2020 1622   BASOSABS 0.1 11/27/2020 1622    -------------------------------  Imaging from last 24 hours (if applicable):  Radiology interpretation: No results found.

## 2020-11-29 ENCOUNTER — Other Ambulatory Visit: Payer: Self-pay

## 2020-11-29 ENCOUNTER — Inpatient Hospital Stay: Payer: BC Managed Care – PPO

## 2020-11-29 ENCOUNTER — Encounter: Payer: Self-pay | Admitting: Adult Health

## 2020-11-29 ENCOUNTER — Inpatient Hospital Stay: Payer: BC Managed Care – PPO | Admitting: Adult Health

## 2020-11-29 ENCOUNTER — Ambulatory Visit (HOSPITAL_COMMUNITY)
Admission: RE | Admit: 2020-11-29 | Discharge: 2020-11-29 | Disposition: A | Discharge status: Home / Self Care | Payer: BC Managed Care – PPO | Source: Ambulatory Visit | Attending: Adult Health | Admitting: Adult Health

## 2020-11-29 VITALS — BP 109/44 | HR 71 | Temp 97.5°F | Resp 16 | Ht 66.0 in | Wt 206.9 lb

## 2020-11-29 DIAGNOSIS — Z170 Estrogen receptor positive status [ER+]: Secondary | ICD-10-CM | POA: Diagnosis not present

## 2020-11-29 DIAGNOSIS — D701 Agranulocytosis secondary to cancer chemotherapy: Secondary | ICD-10-CM | POA: Diagnosis not present

## 2020-11-29 DIAGNOSIS — Z801 Family history of malignant neoplasm of trachea, bronchus and lung: Secondary | ICD-10-CM | POA: Diagnosis not present

## 2020-11-29 DIAGNOSIS — Z8041 Family history of malignant neoplasm of ovary: Secondary | ICD-10-CM | POA: Diagnosis not present

## 2020-11-29 DIAGNOSIS — C50412 Malignant neoplasm of upper-outer quadrant of left female breast: Secondary | ICD-10-CM

## 2020-11-29 DIAGNOSIS — Z95828 Presence of other vascular implants and grafts: Secondary | ICD-10-CM

## 2020-11-29 DIAGNOSIS — Z5111 Encounter for antineoplastic chemotherapy: Secondary | ICD-10-CM | POA: Diagnosis not present

## 2020-11-29 DIAGNOSIS — T451X5A Adverse effect of antineoplastic and immunosuppressive drugs, initial encounter: Secondary | ICD-10-CM | POA: Diagnosis not present

## 2020-11-29 DIAGNOSIS — Z803 Family history of malignant neoplasm of breast: Secondary | ICD-10-CM | POA: Diagnosis not present

## 2020-11-29 DIAGNOSIS — R5383 Other fatigue: Secondary | ICD-10-CM | POA: Diagnosis not present

## 2020-11-29 DIAGNOSIS — I951 Orthostatic hypotension: Secondary | ICD-10-CM | POA: Diagnosis not present

## 2020-11-29 DIAGNOSIS — Z79899 Other long term (current) drug therapy: Secondary | ICD-10-CM | POA: Diagnosis not present

## 2020-11-29 DIAGNOSIS — E860 Dehydration: Secondary | ICD-10-CM | POA: Diagnosis not present

## 2020-11-29 DIAGNOSIS — M47816 Spondylosis without myelopathy or radiculopathy, lumbar region: Secondary | ICD-10-CM | POA: Diagnosis not present

## 2020-11-29 DIAGNOSIS — R109 Unspecified abdominal pain: Secondary | ICD-10-CM | POA: Diagnosis not present

## 2020-11-29 DIAGNOSIS — K5900 Constipation, unspecified: Secondary | ICD-10-CM | POA: Diagnosis not present

## 2020-11-29 LAB — CBC WITH DIFFERENTIAL/PLATELET
Abs Immature Granulocytes: 0.01 10*3/uL (ref 0.00–0.07)
Basophils Absolute: 0.1 10*3/uL (ref 0.0–0.1)
Basophils Relative: 2 %
Eosinophils Absolute: 0.2 10*3/uL (ref 0.0–0.5)
Eosinophils Relative: 5 %
HCT: 35.4 % — ABNORMAL LOW (ref 36.0–46.0)
Hemoglobin: 11.9 g/dL — ABNORMAL LOW (ref 12.0–15.0)
Immature Granulocytes: 0 %
Lymphocytes Relative: 21 %
Lymphs Abs: 0.8 10*3/uL (ref 0.7–4.0)
MCH: 31.7 pg (ref 26.0–34.0)
MCHC: 33.6 g/dL (ref 30.0–36.0)
MCV: 94.4 fL (ref 80.0–100.0)
Monocytes Absolute: 0.4 10*3/uL (ref 0.1–1.0)
Monocytes Relative: 10 %
Neutro Abs: 2.2 10*3/uL (ref 1.7–7.7)
Neutrophils Relative %: 62 %
Platelets: 206 10*3/uL (ref 150–400)
RBC: 3.75 MIL/uL — ABNORMAL LOW (ref 3.87–5.11)
RDW: 15.9 % — ABNORMAL HIGH (ref 11.5–15.5)
WBC: 3.6 10*3/uL — ABNORMAL LOW (ref 4.0–10.5)
nRBC: 0 % (ref 0.0–0.2)

## 2020-11-29 LAB — COMPREHENSIVE METABOLIC PANEL
ALT: 39 U/L (ref 0–44)
AST: 27 U/L (ref 15–41)
Albumin: 3.7 g/dL (ref 3.5–5.0)
Alkaline Phosphatase: 71 U/L (ref 38–126)
Anion gap: 8 (ref 5–15)
BUN: 11 mg/dL (ref 6–20)
CO2: 23 mmol/L (ref 22–32)
Calcium: 8.8 mg/dL — ABNORMAL LOW (ref 8.9–10.3)
Chloride: 110 mmol/L (ref 98–111)
Creatinine, Ser: 0.77 mg/dL (ref 0.44–1.00)
GFR, Estimated: >60 mL/min (ref >60)
Glucose, Bld: 111 mg/dL — ABNORMAL HIGH (ref 70–99)
Potassium: 3.8 mmol/L (ref 3.5–5.1)
Sodium: 141 mmol/L (ref 135–145)
Total Bilirubin: 0.3 mg/dL (ref 0.3–1.2)
Total Protein: 6.2 g/dL — ABNORMAL LOW (ref 6.5–8.1)

## 2020-11-29 MED ORDER — SODIUM CHLORIDE 0.9 % IV SOLN
10.0000 mg | Freq: Once | INTRAVENOUS | Status: AC
  Administered 2020-11-29: 10 mg via INTRAVENOUS
  Filled 2020-11-29: qty 10

## 2020-11-29 MED ORDER — HEPARIN SOD (PORK) LOCK FLUSH 100 UNIT/ML IV SOLN
500.0000 [IU] | Freq: Once | INTRAVENOUS | Status: AC | PRN
  Administered 2020-11-29: 500 [IU]
  Filled 2020-11-29: qty 5

## 2020-11-29 MED ORDER — SODIUM CHLORIDE 0.9 % IV SOLN
Freq: Once | INTRAVENOUS | Status: AC
Start: 2020-11-29 — End: 2020-11-29
  Filled 2020-11-29: qty 250

## 2020-11-29 MED ORDER — DIPHENHYDRAMINE HCL 50 MG/ML IJ SOLN
INTRAMUSCULAR | Status: AC
  Filled 2020-11-29: qty 1

## 2020-11-29 MED ORDER — LORAZEPAM 2 MG/ML IJ SOLN
1.0000 mg | Freq: Once | INTRAMUSCULAR | Status: AC
  Administered 2020-11-29: 1 mg via INTRAVENOUS

## 2020-11-29 MED ORDER — FAMOTIDINE IN NACL 20-0.9 MG/50ML-% IV SOLN
INTRAVENOUS | Status: AC
  Filled 2020-11-29: qty 50

## 2020-11-29 MED ORDER — SODIUM CHLORIDE 0.9 % IV SOLN
45.0000 mg/m2 | Freq: Once | INTRAVENOUS | Status: AC
  Administered 2020-11-29: 96 mg via INTRAVENOUS
  Filled 2020-11-29: qty 16

## 2020-11-29 MED ORDER — LORAZEPAM 2 MG/ML IJ SOLN
INTRAMUSCULAR | Status: AC
  Filled 2020-11-29: qty 1

## 2020-11-29 MED ORDER — METHYLPREDNISOLONE SODIUM SUCC 125 MG IJ SOLR
60.0000 mg | Freq: Once | INTRAMUSCULAR | Status: AC
  Administered 2020-11-29: 60 mg via INTRAVENOUS

## 2020-11-29 MED ORDER — SODIUM CHLORIDE 0.9% FLUSH
10.0000 mL | Freq: Once | INTRAVENOUS | Status: AC
Start: 2020-11-29 — End: 2020-11-29
  Administered 2020-11-29: 10 mL
  Filled 2020-11-29: qty 10

## 2020-11-29 MED ORDER — SODIUM CHLORIDE 0.9 % IV SOLN
Freq: Once | INTRAVENOUS | Status: AC
  Filled 2020-11-29: qty 250

## 2020-11-29 MED ORDER — DIPHENHYDRAMINE HCL 50 MG/ML IJ SOLN
25.0000 mg | Freq: Once | INTRAMUSCULAR | Status: AC
  Administered 2020-11-29: 25 mg via INTRAVENOUS

## 2020-11-29 MED ORDER — SODIUM CHLORIDE 0.9% FLUSH
10.0000 mL | INTRAVENOUS | Status: DC | PRN
  Administered 2020-11-29: 10 mL
  Filled 2020-11-29: qty 10

## 2020-11-29 MED ORDER — METHYLPREDNISOLONE SODIUM SUCC 125 MG IJ SOLR
INTRAMUSCULAR | Status: AC
  Filled 2020-11-29: qty 2

## 2020-11-29 MED ORDER — FAMOTIDINE IN NACL 20-0.9 MG/50ML-% IV SOLN
20.0000 mg | Freq: Once | INTRAVENOUS | Status: AC
  Administered 2020-11-29: 20 mg via INTRAVENOUS

## 2020-11-29 NOTE — Patient Instructions (Signed)
[facility]COLOG[facility]  Discharge Instructions: °Thank you for choosing [facility] to provide your oncology and hematology care.  ° °If you have a lab appointment with the Cancer Center, please go directly to the Cancer Center and check in at the registration area. °  °Wear comfortable clothing and clothing appropriate for easy access to any Portacath or PICC line.  ° °We strive to give you quality time with your provider. You may need to reschedule your appointment if you arrive late (15 or more minutes).  Arriving late affects you and other patients whose appointments are after yours.  Also, if you miss three or more appointments without notifying the office, you may be dismissed from the clinic at the provider’s discretion.    °  °For prescription refill requests, have your pharmacy contact our office and allow 72 hours for refills to be completed.   ° °Today you received the following chemotherapy and/or immunotherapy agents: Taxol   °  °To help prevent nausea and vomiting after your treatment, we encourage you to take your nausea medication as directed. ° °BELOW ARE SYMPTOMS THAT SHOULD BE REPORTED IMMEDIATELY: °*FEVER GREATER THAN 100.4 F (38 °C) OR HIGHER °*CHILLS OR SWEATING °*NAUSEA AND VOMITING THAT IS NOT CONTROLLED WITH YOUR NAUSEA MEDICATION °*UNUSUAL SHORTNESS OF BREATH °*UNUSUAL BRUISING OR BLEEDING °*URINARY PROBLEMS (pain or burning when urinating, or frequent urination) °*BOWEL PROBLEMS (unusual diarrhea, constipation, pain near the anus) °TENDERNESS IN MOUTH AND THROAT WITH OR WITHOUT PRESENCE OF ULCERS (sore throat, sores in mouth, or a toothache) °UNUSUAL RASH, SWELLING OR PAIN  °UNUSUAL VAGINAL DISCHARGE OR ITCHING  ° °Items with * indicate a potential emergency and should be followed up as soon as possible or go to the Emergency Department if any problems should occur. ° °Please show the CHEMOTHERAPY ALERT CARD or IMMUNOTHERAPY ALERT CARD at check-in to the  Emergency Department and triage nurse. ° °Should you have questions after your visit or need to cancel or reschedule your appointment, please contact [facility]  Dept: [phone]  and follow the prompts.  Office hours are 8:00 a.m. to 4:30 p.m. Monday - Friday. Please note that voicemails left after 4:00 p.m. may not be returned until the following business day.  We are closed weekends and major holidays. You have access to a nurse at all times for urgent questions. Please call the main number to the clinic Dept: [phone] and follow the prompts. ° ° °For any non-urgent questions, you may also contact your provider using MyChart. We now offer e-Visits for anyone 18 and older to request care online for non-urgent symptoms. For details visit [email or web address]. °  °Also download the MyChart app! Go to the app store, search "MyChart", open the app, select [facility], and log in with your MyChart username and password. ° °Due to Covid, a mask is required upon entering the hospital/clinic. If you do not have a mask, one will be given to you upon arrival. For doctor visits, patients may have 1 support person aged 18 or older with them. For treatment visits, patients cannot have anyone with them due to current Covid guidelines and our immunocompromised population.  ° °

## 2020-11-29 NOTE — Patient Instructions (Signed)
Implanted Port Insertion, Care After This sheet gives you information about how to care for yourself after your procedure. Your health care provider may also give you more specific instructions. If you have problems or questions, contact your health care provider. What can I expect after the procedure? After the procedure, it is common to have:  Discomfort at the port insertion site.  Bruising on the skin over the port. This should improve over 3-4 days. Follow these instructions at home: Port care  After your port is placed, you will get a manufacturer's information card. The card has information about your port. Keep this card with you at all times.  Take care of the port as told by your health care provider. Ask your health care provider if you or a family member can get training for taking care of the port at home. A home health care nurse may also take care of the port.  Make sure to remember what type of port you have. Incision care  Follow instructions from your health care provider about how to take care of your port insertion site. Make sure you: ? Wash your hands with soap and water before and after you change your bandage (dressing). If soap and water are not available, use hand sanitizer. ? Change your dressing as told by your health care provider. ? Leave stitches (sutures), skin glue, or adhesive strips in place. These skin closures may need to stay in place for 2 weeks or longer. If adhesive strip edges start to loosen and curl up, you may trim the loose edges. Do not remove adhesive strips completely unless your health care provider tells you to do that.  Check your port insertion site every day for signs of infection. Check for: ? Redness, swelling, or pain. ? Fluid or blood. ? Warmth. ? Pus or a bad smell.      Activity  Return to your normal activities as told by your health care provider. Ask your health care provider what activities are safe for you.  Do not  lift anything that is heavier than 10 lb (4.5 kg), or the limit that you are told, until your health care provider says that it is safe. General instructions  Take over-the-counter and prescription medicines only as told by your health care provider.  Do not take baths, swim, or use a hot tub until your health care provider approves. Ask your health care provider if you may take showers. You may only be allowed to take sponge baths.  Do not drive for 24 hours if you were given a sedative during your procedure.  Wear a medical alert bracelet in case of an emergency. This will tell any health care providers that you have a port.  Keep all follow-up visits as told by your health care provider. This is important. Contact a health care provider if:  You cannot flush your port with saline as directed, or you cannot draw blood from the port.  You have a fever or chills.  You have redness, swelling, or pain around your port insertion site.  You have fluid or blood coming from your port insertion site.  Your port insertion site feels warm to the touch.  You have pus or a bad smell coming from the port insertion site. Get help right away if:  You have chest pain or shortness of breath.  You have bleeding from your port that you cannot control. Summary  Take care of the port as told by your   health care provider. Keep the manufacturer's information card with you at all times.  Change your dressing as told by your health care provider.  Contact a health care provider if you have a fever or chills or if you have redness, swelling, or pain around your port insertion site.  Keep all follow-up visits as told by your health care provider. This information is not intended to replace advice given to you by your health care provider. Make sure you discuss any questions you have with your health care provider. Document Revised: 02/15/2018 Document Reviewed: 02/15/2018 Elsevier Patient Education   2021 [employer]  

## 2020-11-29 NOTE — Assessment & Plan Note (Addendum)
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 ------------------------------------------------------------------------------------------------------------------------- Current treatment: Completed 4 cycles ofdose dense Adriamycin Cytoxan, today is cycle 5 Taxol Echocardiogram2/2/22: EF 60-65%  Chemo Toxicities:  Sashay is doing well.  She Is tolerating her Taxol with mild issues, and will receive IV fluids with every treatment to help with her dehydration.  She denies peripheral neuropathy and we are watching her closely for this.  Will get plain films of her abdomen to evaluate if this pain is from constipation, which she has experienced.  She will have this completed following her treatment today.   Return to clinic weekly for Taxol and every other week for follow-up with me

## 2020-11-29 NOTE — Progress Notes (Signed)
[facility] Follow up:    Tracy Pepper, MD [place] [place] [address]   DIAGNOSIS: Cancer Staging Malignant neoplasm of upper-outer quadrant of left breast in female, estrogen receptor positive ([name]) Staging form: Breast, AJCC 8th Edition - Clinical stage from 08/21/2020: Stage IIA (cT1c, cN1, cM0, G3, ER+, PR+, HER2-) - Signed by [name], MD on 08/21/2020   SUMMARY OF ONCOLOGIC HISTORY: Oncology History  Malignant neoplasm of upper-outer quadrant of left breast in female, estrogen receptor positive ([name])  08/21/2020 Initial Diagnosis   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 No pathogenic variants identified on the [facility] Breast Cancer STAT Panel + Multi-Cancer Panel. VUS in [facility] called c.983C>T identified. The report date is 09/04/2020.   The STAT Breast cancer panel offered by [facility] includes sequencing and rearrangement analysis for the following 9 genes:  ATM, BRCA1, BRCA2, CDH1, CHEK2, PALB2, PTEN, STK11 and TP53.    The Multi-Cancer Panel offered by [facility] includes sequencing and/or deletion duplication testing of the following 85 genes: AIP, ALK, APC, ATM, AXIN2,BAP1,  BARD1, BLM, BMPR1A, BRCA1, BRCA2, BRIP1, CASR, CDC73, CDH1, CDK4, CDKN1B, CDKN1C, CDKN2A (p14ARF), CDKN2A (p16INK4a), CEBPA, CHEK2, CTNNA1, DICER1, DIS3L2, EGFR (c.2369C>T, p.Thr790Met  variant only), EPCAM (Deletion/duplication testing only), FH, FLCN, GATA2, GPC3, GREM1 (Promoter region deletion/duplication testing only), HOXB13 (c.251G>A, p.Gly84Glu), HRAS, KIT, MAX, MEN1, MET, MITF (c.952G>A, p.Glu318Lys variant only), MLH1, MSH2, MSH3, MSH6, MUTYH, NBN, NF1, NF2, NTHL1, PALB2, PDGFRA, PHOX2B, PMS2, POLD1, POLE, POT1, PRKAR1A, PTCH1, PTEN, RAD50, RAD51C, RAD51D, RB1, RECQL4, RET, RNF43, RUNX1, SDHAF2, SDHA (sequence changes only), SDHB, SDHC, SDHD, SMAD4, SMARCA4, SMARCB1, SMARCE1, STK11, SUFU, TERC, TERT, TMEM127, TP53, TSC1, TSC2, VHL, WRN and WT1.    09/06/2020 -  Chemotherapy    Patient is on Treatment Plan: BREAST ADJUVANT DOSE DENSE AC Q14D / PACLITAXEL Q7D        CURRENT THERAPY: neoadjvuant weekly Taxol, #5/12  INTERVAL HISTORY: Tracy Dyer 50 y.o. female returns for evaluation prior to chemo.  This past week she had some dehydration and orthostatic hypotension.  She received fluids intravenously which helped.  She has some nail changes.  She was placed on gabapentin and takes $RemoveB'600mg'IsXDDeQi$  at bedtime.  She wants to know if she could decrease this.     Patient Active Problem List   Diagnosis Date Noted  . Port-A-Cath in place 09/20/2020  . Genetic testing 09/04/2020  . Family history of breast cancer   . Family history of ovarian cancer   . Family history of lung cancer   . Malignant neoplasm of upper-outer quadrant of left breast in female, estrogen receptor positive ([place]) 08/21/2020  . Anxiety 11/16/2013  . Hypercoagulable state ([name]) 11/16/2013  . TIA (transient ischemic attack) 11/15/2013  . Chest discomfort 01/20/2012  . Factor V Leiden ([name]) 01/20/2012  . MVP (mitral valve prolapse) 01/20/2012  . Acute bronchitis 12/23/2011    is allergic to sulfa  antibiotics, hydromorphone, oxycodone, propoxyphene, and sulfamethoxazole.  MEDICAL HISTORY: Past Medical History:  Diagnosis Date  . Anxiety   . Cancer [facility])    breast  . Family history of breast  cancer   . Family history of lung cancer   . Family history of ovarian cancer   . GERD (gastroesophageal reflux disease)    diet controlled  . Seasonal allergies     SURGICAL HISTORY: Past Surgical History:  Procedure Laterality Date  . BREAST BIOPSY Left 2019   x 2 biopsy  . BREAST EXCISIONAL BIOPSY Left   . CHOLECYSTECTOMY  2008  . PORTACATH PLACEMENT N/A 09/05/2020   Procedure: INSERTION PORT-A-CATH;  Surgeon: [name], MD;  Location: [facility];  Service: General;  Laterality: N/A;  PEC BLOCK  . WISDOM TOOTH EXTRACTION      SOCIAL HISTORY: Social History   Socioeconomic History  . Marital status: Married    Spouse name: Not on file  . Number of children: Not on file  . Years of education: Not on file  . Highest education level: Not on file  Occupational History  . Not on file  Tobacco Use  . Smoking status: Never Smoker  . Smokeless tobacco: Never Used  Vaping Use  . Vaping Use: Never used  Substance and Sexual Activity  . Alcohol use: Not Currently  . Drug use: Never  . Sexual activity: Yes    Birth control/protection: None  Other Topics Concern  . Not on file  Social History Narrative  . Not on file   Social Determinants of Health   Financial Resource Strain: Not on file  Food Insecurity: No Food Insecurity  . Worried About Charity fundraiser in the Last Year: Never true  . Ran Out of Food in the Last Year: Never true  Transportation Needs: No Transportation Needs  . Lack of Transportation (Medical): No  . Lack of Transportation (Non-Medical): No  Physical Activity: Not on file  Stress: Not on file  Social Connections: Not on file  Intimate Partner Violence: Not on file    FAMILY HISTORY: Family History  Problem Relation Age of Onset  . Eczema Father   . Allergic rhinitis Brother   . Breast cancer Mother        in 74's  . Cancer Maternal Uncle        unk type  . Lung cancer Maternal Grandfather 73  . Heart Problems Paternal Grandfather    . Melanoma Cousin   . Ovarian cancer Paternal Great-grandmother   . Cancer Paternal Great-grandmother        GYN cancer, possibly ovarian?  . Cancer Maternal Aunt        half aunts/uncles x5- rare cancers    Review of Systems  Constitutional: Positive for fatigue. Negative for appetite change, chills, fever and unexpected weight change.  HENT:   Negative for hearing loss, lump/mass and trouble swallowing.   Eyes: Negative for eye problems and icterus.  Respiratory: Negative for chest tightness, cough and shortness of breath.   Cardiovascular: Negative for chest pain, leg swelling and palpitations.  Gastrointestinal: Positive for abdominal pain and constipation. Negative for abdominal distention, diarrhea, nausea and vomiting.  Endocrine: Negative for hot flashes.  Genitourinary: Negative for difficulty urinating.   Musculoskeletal: Negative for arthralgias.  Skin: Negative for itching and rash.  Neurological: Negative for dizziness, extremity weakness, headaches and numbness.  Hematological: Negative for adenopathy. Does not bruise/bleed easily.  Psychiatric/Behavioral: Negative for depression. The patient is not nervous/anxious.  PHYSICAL EXAMINATION  ECOG PERFORMANCE STATUS: 2 - Symptomatic, <50% confined to bed  Vitals:   11/29/20 1011  BP: (!) 109/44  Pulse: 71  Resp: 16  Temp: (!) 97.5 F (36.4 C)  SpO2: 100%    Physical Exam Constitutional:      General: She is not in acute distress.    Appearance: Normal appearance. She is not toxic-appearing.  HENT:     Head: Normocephalic and atraumatic.  Eyes:     General: No scleral icterus. Cardiovascular:     Rate and Rhythm: Normal rate and regular rhythm.     Pulses: Normal pulses.     Heart sounds: Normal heart sounds.  Pulmonary:     Effort: Pulmonary effort is normal.     Breath sounds: Normal breath sounds.     Comments: Unable to palpate any breast tumors or progression Abdominal:     General: Abdomen  is flat. Bowel sounds are normal. There is no distension.     Palpations: Abdomen is soft.     Tenderness: There is no abdominal tenderness.  Musculoskeletal:        General: No swelling.     Cervical back: Neck supple.  Lymphadenopathy:     Cervical: No cervical adenopathy.  Skin:    General: Skin is warm and dry.     Findings: No rash.  Neurological:     General: No focal deficit present.     Mental Status: She is alert.  Psychiatric:        Mood and Affect: Mood normal.        Behavior: Behavior normal.     LABORATORY DATA:  CBC    Component Value Date/Time   WBC 3.6 (L) 11/29/2020 0938   RBC 3.75 (L) 11/29/2020 0938   HGB 11.9 (L) 11/29/2020 0938   HGB 13.7 09/13/2020 1048   HCT 35.4 (L) 11/29/2020 0938   PLT 206 11/29/2020 0938   PLT 176 09/13/2020 1048   MCV 94.4 11/29/2020 0938   MCH 31.7 11/29/2020 0938   MCHC 33.6 11/29/2020 0938   RDW 15.9 (H) 11/29/2020 0938   LYMPHSABS 0.8 11/29/2020 0938   MONOABS 0.4 11/29/2020 0938   EOSABS 0.2 11/29/2020 0938   BASOSABS 0.1 11/29/2020 0938    CMP     Component Value Date/Time   NA 141 11/29/2020 0938   K 3.8 11/29/2020 0938   CL 110 11/29/2020 0938   CO2 23 11/29/2020 0938   GLUCOSE 111 (H) 11/29/2020 0938   BUN 11 11/29/2020 0938   CREATININE 0.77 11/29/2020 0938   CREATININE 0.92 11/27/2020 1622   CALCIUM 8.8 (L) 11/29/2020 0938   PROT 6.2 (L) 11/29/2020 0938   ALBUMIN 3.7 11/29/2020 0938   AST 27 11/29/2020 0938   AST 38 11/27/2020 1622   ALT 39 11/29/2020 0938   ALT 53 (H) 11/27/2020 1622   ALKPHOS 71 11/29/2020 0938   BILITOT 0.3 11/29/2020 0938   BILITOT 0.5 11/27/2020 1622   GFRNONAA >60 11/29/2020 0938   GFRNONAA >60 11/27/2020 1622         ASSESSMENT and PLAN:   Malignant neoplasm of upper-outer quadrant of left breast in female, estrogen receptor positive ([place]) Screening mammogram detected punctate calcifications left breast UOQ: Stable, interval development of mass UOQ left breast  with distortion 1.8 cm (3 cm from nipple), 3 o'clock position 5 cm from nipple 1 cm mass: Biopsy benign, concordant, fibroadenoma, 2 enlarged lymph nodes: Positive, breast biopsy revealed grade 3 IDC ER 95%,  PR 95%, HER2 equivocal, FISH pending, Ki-67 20% T1CN1 stage IIa MammaPrint: High risk: Luminal type B, probability of path CR 6% with chemo and antiestrogen therapy predicted benefit of treatment at 5 years 94.6%, average 10-year risk of recurrence untreated: 29%  Treatment plan: 1.Neoadjuvant chemotherapy with dose dense Adriamycin and Cytoxan followed by Taxol weekly x12 2.breast conserving surgery with sentinel lymph node and targeted node dissection 3.Adjuvant radiation therapy 4.Followed by adjuvant antiestrogen therapy. ------------------------------------------------------------------------------------------------------------------------- Current treatment: Completed 4 cycles ofdose dense Adriamycin Cytoxan, today is cycle 5 Taxol Echocardiogram2/2/22: EF 60-65%  Chemo Toxicities:  Tonya is doing well.  She Is tolerating her Taxol with mild issues, and will receive IV fluids with every treatment to help with her dehydration.  She denies peripheral neuropathy and we are watching her closely for this.  Will get plain films of her abdomen to evaluate if this pain is from constipation, which she has experienced.  She will have this completed following her treatment today.   Return to clinic weekly for Taxol and every other week for follow-up with me   Orders Placed This Encounter  Procedures  . DG Abd 2 Views    Standing Status:   Future    Standing Expiration Date:   11/29/2021    Order Specific Question:   Reason for Exam (SYMPTOM  OR DIAGNOSIS REQUIRED)    Answer:   abdominal discomfort, eval for stool burden please, or SBO    Order Specific Question:   Is patient pregnant?    Answer:   No    Order Specific Question:   Preferred imaging location?    Answer:   [facility]    All questions were answered. The patient knows to call the clinic with any problems, questions or concerns. We can certainly see the patient much sooner if necessary.  Total encounter time: 20 minutes* in face to face encounter time per above note, documenting the plan, and collaborating with oncology team of providers.   Wilber Bihari, NP 11/29/20 10:59 AM Medical Oncology and Hematology [facility] [place], [place] [address] Tel. [phone]    Fax. [phone]  *Total Encounter Time as defined by the Centers for Medicare and Medicaid Services includes, in addition to the face-to-face time of a patient visit (documented in the note above) non-face-to-face time: obtaining and reviewing outside history, ordering and reviewing medications, tests or procedures, care coordination (communications with other health care professionals or caregivers) and documentation in the medical record.

## 2020-12-02 ENCOUNTER — Encounter: Payer: Self-pay | Admitting: Hematology and Oncology

## 2020-12-03 ENCOUNTER — Other Ambulatory Visit: Payer: Self-pay

## 2020-12-03 DIAGNOSIS — Z170 Estrogen receptor positive status [ER+]: Secondary | ICD-10-CM

## 2020-12-03 DIAGNOSIS — C50412 Malignant neoplasm of upper-outer quadrant of left female breast: Secondary | ICD-10-CM

## 2020-12-03 NOTE — Progress Notes (Signed)
1 mg Ativan Wasted. Velna Ochs RN witnessed

## 2020-12-04 ENCOUNTER — Telehealth: Payer: Self-pay

## 2020-12-04 NOTE — Telephone Encounter (Signed)
Pt is scheduled for NM bone scan 12/11/20 at [facility]. Pt verbalized understanding and agreement.

## 2020-12-06 ENCOUNTER — Inpatient Hospital Stay: Payer: BC Managed Care – PPO | Attending: Adult Health

## 2020-12-06 ENCOUNTER — Other Ambulatory Visit: Payer: Self-pay

## 2020-12-06 ENCOUNTER — Inpatient Hospital Stay: Payer: BC Managed Care – PPO

## 2020-12-06 ENCOUNTER — Encounter: Payer: Self-pay | Admitting: *Deleted

## 2020-12-06 ENCOUNTER — Other Ambulatory Visit: Payer: BC Managed Care – PPO

## 2020-12-06 VITALS — BP 116/69 | HR 55 | Temp 98.0°F | Resp 16 | Wt 207.5 lb

## 2020-12-06 DIAGNOSIS — D6851 Activated protein C resistance: Secondary | ICD-10-CM | POA: Diagnosis not present

## 2020-12-06 DIAGNOSIS — G459 Transient cerebral ischemic attack, unspecified: Secondary | ICD-10-CM | POA: Insufficient documentation

## 2020-12-06 DIAGNOSIS — Z801 Family history of malignant neoplasm of trachea, bronchus and lung: Secondary | ICD-10-CM | POA: Insufficient documentation

## 2020-12-06 DIAGNOSIS — Z5111 Encounter for antineoplastic chemotherapy: Secondary | ICD-10-CM | POA: Insufficient documentation

## 2020-12-06 DIAGNOSIS — Z170 Estrogen receptor positive status [ER+]: Secondary | ICD-10-CM | POA: Insufficient documentation

## 2020-12-06 DIAGNOSIS — C50412 Malignant neoplasm of upper-outer quadrant of left female breast: Secondary | ICD-10-CM

## 2020-12-06 DIAGNOSIS — K5900 Constipation, unspecified: Secondary | ICD-10-CM | POA: Insufficient documentation

## 2020-12-06 DIAGNOSIS — Z8041 Family history of malignant neoplasm of ovary: Secondary | ICD-10-CM | POA: Diagnosis not present

## 2020-12-06 DIAGNOSIS — R5383 Other fatigue: Secondary | ICD-10-CM | POA: Insufficient documentation

## 2020-12-06 DIAGNOSIS — F419 Anxiety disorder, unspecified: Secondary | ICD-10-CM | POA: Diagnosis not present

## 2020-12-06 DIAGNOSIS — K219 Gastro-esophageal reflux disease without esophagitis: Secondary | ICD-10-CM | POA: Diagnosis not present

## 2020-12-06 DIAGNOSIS — Z79899 Other long term (current) drug therapy: Secondary | ICD-10-CM | POA: Insufficient documentation

## 2020-12-06 DIAGNOSIS — Z95828 Presence of other vascular implants and grafts: Secondary | ICD-10-CM

## 2020-12-06 DIAGNOSIS — Z803 Family history of malignant neoplasm of breast: Secondary | ICD-10-CM | POA: Diagnosis not present

## 2020-12-06 DIAGNOSIS — Z923 Personal history of irradiation: Secondary | ICD-10-CM | POA: Diagnosis not present

## 2020-12-06 LAB — CBC WITH DIFFERENTIAL/PLATELET
Abs Immature Granulocytes: 0.02 10*3/uL (ref 0.00–0.07)
Basophils Absolute: 0.1 10*3/uL (ref 0.0–0.1)
Basophils Relative: 2 %
Eosinophils Absolute: 0.2 10*3/uL (ref 0.0–0.5)
Eosinophils Relative: 4 %
HCT: 33.6 % — ABNORMAL LOW (ref 36.0–46.0)
Hemoglobin: 11.6 g/dL — ABNORMAL LOW (ref 12.0–15.0)
Immature Granulocytes: 1 %
Lymphocytes Relative: 26 %
Lymphs Abs: 1 10*3/uL (ref 0.7–4.0)
MCH: 32.6 pg (ref 26.0–34.0)
MCHC: 34.5 g/dL (ref 30.0–36.0)
MCV: 94.4 fL (ref 80.0–100.0)
Monocytes Absolute: 0.4 10*3/uL (ref 0.1–1.0)
Monocytes Relative: 10 %
Neutro Abs: 2.3 10*3/uL (ref 1.7–7.7)
Neutrophils Relative %: 57 %
Platelets: 216 10*3/uL (ref 150–400)
RBC: 3.56 MIL/uL — ABNORMAL LOW (ref 3.87–5.11)
RDW: 15.2 % (ref 11.5–15.5)
WBC: 3.9 10*3/uL — ABNORMAL LOW (ref 4.0–10.5)
nRBC: 0 % (ref 0.0–0.2)

## 2020-12-06 LAB — COMPREHENSIVE METABOLIC PANEL
ALT: 35 U/L (ref 0–44)
AST: 26 U/L (ref 15–41)
Albumin: 3.6 g/dL (ref 3.5–5.0)
Alkaline Phosphatase: 65 U/L (ref 38–126)
Anion gap: 7 (ref 5–15)
BUN: 9 mg/dL (ref 6–20)
CO2: 25 mmol/L (ref 22–32)
Calcium: 8.8 mg/dL — ABNORMAL LOW (ref 8.9–10.3)
Chloride: 109 mmol/L (ref 98–111)
Creatinine, Ser: 0.75 mg/dL (ref 0.44–1.00)
GFR, Estimated: >60 mL/min (ref >60)
Glucose, Bld: 87 mg/dL (ref 70–99)
Potassium: 4.2 mmol/L (ref 3.5–5.1)
Sodium: 141 mmol/L (ref 135–145)
Total Bilirubin: 0.3 mg/dL (ref 0.3–1.2)
Total Protein: 6 g/dL — ABNORMAL LOW (ref 6.5–8.1)

## 2020-12-06 MED ORDER — SODIUM CHLORIDE 0.9 % IV SOLN
Freq: Once | INTRAVENOUS | Status: AC
  Filled 2020-12-06: qty 250

## 2020-12-06 MED ORDER — SODIUM CHLORIDE 0.9 % IV SOLN
10.0000 mg | Freq: Once | INTRAVENOUS | Status: AC
  Administered 2020-12-06: 10 mg via INTRAVENOUS
  Filled 2020-12-06: qty 10

## 2020-12-06 MED ORDER — LORAZEPAM 2 MG/ML IJ SOLN
INTRAMUSCULAR | Status: AC
  Filled 2020-12-06: qty 1

## 2020-12-06 MED ORDER — HEPARIN SOD (PORK) LOCK FLUSH 100 UNIT/ML IV SOLN
500.0000 [IU] | Freq: Once | INTRAVENOUS | Status: AC | PRN
  Administered 2020-12-06: 500 [IU]
  Filled 2020-12-06: qty 5

## 2020-12-06 MED ORDER — DIPHENHYDRAMINE HCL 50 MG/ML IJ SOLN
INTRAMUSCULAR | Status: AC
  Filled 2020-12-06: qty 1

## 2020-12-06 MED ORDER — METHYLPREDNISOLONE SODIUM SUCC 125 MG IJ SOLR
60.0000 mg | Freq: Once | INTRAMUSCULAR | Status: AC
  Administered 2020-12-06: 60 mg via INTRAVENOUS

## 2020-12-06 MED ORDER — LORAZEPAM 2 MG/ML IJ SOLN
1.0000 mg | Freq: Once | INTRAMUSCULAR | Status: AC
Start: 2020-12-06 — End: 2020-12-06
  Administered 2020-12-06: 1 mg via INTRAVENOUS

## 2020-12-06 MED ORDER — FAMOTIDINE 20 MG IN NS 100 ML IVPB
INTRAVENOUS | Status: AC
  Filled 2020-12-06: qty 100

## 2020-12-06 MED ORDER — SODIUM CHLORIDE 0.9 % IV SOLN
Freq: Once | INTRAVENOUS | Status: AC
Start: 2020-12-06 — End: 2020-12-06
  Filled 2020-12-06: qty 250

## 2020-12-06 MED ORDER — SODIUM CHLORIDE 0.9 % IV SOLN
45.0000 mg/m2 | Freq: Once | INTRAVENOUS | Status: AC
  Administered 2020-12-06: 96 mg via INTRAVENOUS
  Filled 2020-12-06: qty 16

## 2020-12-06 MED ORDER — DIPHENHYDRAMINE HCL 50 MG/ML IJ SOLN
25.0000 mg | Freq: Once | INTRAMUSCULAR | Status: AC
Start: 2020-12-06 — End: 2020-12-06
  Administered 2020-12-06: 25 mg via INTRAVENOUS

## 2020-12-06 MED ORDER — FAMOTIDINE 20 MG IN NS 100 ML IVPB
20.0000 mg | Freq: Once | INTRAVENOUS | Status: AC
  Administered 2020-12-06: 20 mg via INTRAVENOUS

## 2020-12-06 MED ORDER — SODIUM CHLORIDE 0.9% FLUSH
10.0000 mL | INTRAVENOUS | Status: DC | PRN
  Administered 2020-12-06: 10 mL
  Filled 2020-12-06: qty 10

## 2020-12-06 MED ORDER — SODIUM CHLORIDE 0.9% FLUSH
10.0000 mL | Freq: Once | INTRAVENOUS | Status: AC
  Administered 2020-12-06: 10 mL
  Filled 2020-12-06: qty 10

## 2020-12-06 NOTE — Patient Instructions (Signed)
[facility]   Discharge Instructions: Thank you for choosing [facility] to provide your oncology and hematology care.   If you have a lab appointment with the Cancer Center, please go directly to the Cancer Center and check in at the registration area.   Wear comfortable clothing and clothing appropriate for easy access to any Portacath or PICC line.   We strive to give you quality time with your provider. You may need to reschedule your appointment if you arrive late (15 or more minutes).  Arriving late affects you and other patients whose appointments are after yours.  Also, if you miss three or more appointments without notifying the office, you may be dismissed from the clinic at the provider's discretion.      For prescription refill requests, have your pharmacy contact our office and allow 72 hours for refills to be completed.    Today you received the following chemotherapy and/or immunotherapy agents: paclitaxel.      To help prevent nausea and vomiting after your treatment, we encourage you to take your nausea medication as directed.  BELOW ARE SYMPTOMS THAT SHOULD BE REPORTED IMMEDIATELY: *FEVER GREATER THAN 100.4 F (38 C) OR HIGHER *CHILLS OR SWEATING *NAUSEA AND VOMITING THAT IS NOT CONTROLLED WITH YOUR NAUSEA MEDICATION *UNUSUAL SHORTNESS OF BREATH *UNUSUAL BRUISING OR BLEEDING *URINARY PROBLEMS (pain or burning when urinating, or frequent urination) *BOWEL PROBLEMS (unusual diarrhea, constipation, pain near the anus) TENDERNESS IN MOUTH AND THROAT WITH OR WITHOUT PRESENCE OF ULCERS (sore throat, sores in mouth, or a toothache) UNUSUAL RASH, SWELLING OR PAIN  UNUSUAL VAGINAL DISCHARGE OR ITCHING   Items with * indicate a potential emergency and should be followed up as soon as possible or go to the Emergency Department if any problems should occur.  Please show the CHEMOTHERAPY ALERT CARD or IMMUNOTHERAPY ALERT CARD at check-in  to the Emergency Department and triage nurse.  Should you have questions after your visit or need to cancel or reschedule your appointment, please contact [facility]  Dept: [phone]  and follow the prompts.  Office hours are 8:00 a.m. to 4:30 p.m. Monday - Friday. Please note that voicemails left after 4:00 p.m. may not be returned until the following business day.  We are closed weekends and major holidays. You have access to a nurse at all times for urgent questions. Please call the main number to the clinic Dept: [phone] and follow the prompts.   For any non-urgent questions, you may also contact your provider using MyChart. We now offer e-Visits for anyone 18 and older to request care online for non-urgent symptoms. For details visit [email or web address].   Also download the MyChart app! Go to the app store, search "MyChart", open the app, select [facility], and log in with your MyChart username and password.  Due to Covid, a mask is required upon entering the hospital/clinic. If you do not have a mask, one will be given to you upon arrival. For doctor visits, patients may have 1 support person aged 18 or older with them. For treatment visits, patients cannot have anyone with them due to current Covid guidelines and our immunocompromised population.   

## 2020-12-09 ENCOUNTER — Encounter: Payer: Self-pay | Admitting: Medical

## 2020-12-11 ENCOUNTER — Ambulatory Visit (HOSPITAL_COMMUNITY)
Admission: RE | Admit: 2020-12-11 | Discharge: 2020-12-11 | Disposition: A | Discharge status: Home / Self Care | Payer: BC Managed Care – PPO | Source: Ambulatory Visit | Attending: Hematology and Oncology | Admitting: Hematology and Oncology

## 2020-12-11 ENCOUNTER — Encounter (HOSPITAL_COMMUNITY)
Admission: RE | Admit: 2020-12-11 | Discharge: 2020-12-11 | Disposition: A | Discharge status: Home / Self Care | Payer: BC Managed Care – PPO | Source: Ambulatory Visit | Attending: Hematology and Oncology | Admitting: Hematology and Oncology

## 2020-12-11 ENCOUNTER — Other Ambulatory Visit: Payer: Self-pay

## 2020-12-11 DIAGNOSIS — C50412 Malignant neoplasm of upper-outer quadrant of left female breast: Secondary | ICD-10-CM | POA: Insufficient documentation

## 2020-12-11 DIAGNOSIS — Z170 Estrogen receptor positive status [ER+]: Secondary | ICD-10-CM | POA: Diagnosis not present

## 2020-12-11 DIAGNOSIS — C50919 Malignant neoplasm of unspecified site of unspecified female breast: Secondary | ICD-10-CM | POA: Diagnosis not present

## 2020-12-11 IMAGING — NM NM BONE WHOLE BODY
2 series · 2 of 2 positions shown · non-contrast
Comparison: None.

CLINICAL DATA: Breast cancer staging.

EXAM:
NUCLEAR MEDICINE WHOLE BODY BONE SCAN
TECHNIQUE: Whole body anterior and posterior images were obtained approximately
3 hours after intravenous injection of radiopharmaceutical.
RADIOPHARMACEUTICALS:  20.6 mCi [M7] MDP IV

[Series 1: whole body · 2.66mm/px · 1 of 1 slices shown (1 of 2)]
[im 1/1]
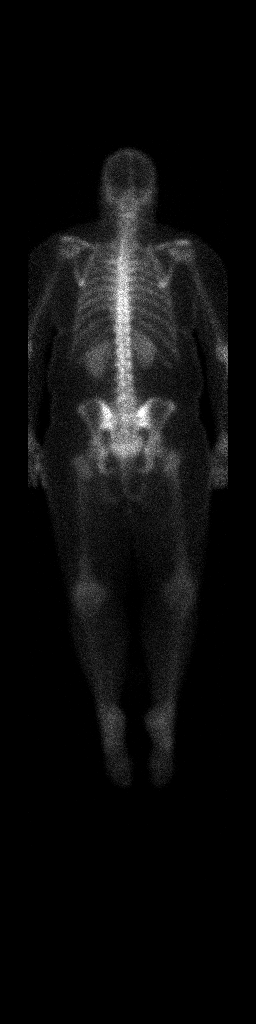

[Series 1: whole body · 2.66mm/px · 1 of 1 slices shown (2 of 2)]
[im 1/1]
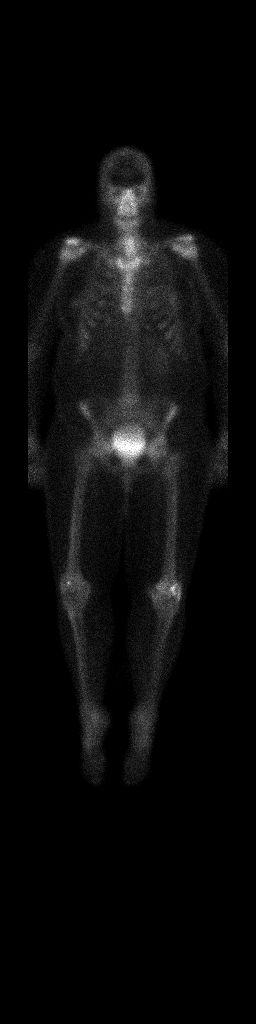

[2 of 2 positions shown; findings below may reference images not displayed]

FINDINGS: Increased tracer uptake in both knees compatible with degenerative
changes. The remainder of the tracer distribution throughout the
bony skeleton is normal. Normal renal and bladder activity.
IMPRESSION: No evidence of metastatic disease.

## 2020-12-11 MED ORDER — TECHNETIUM TC 99M MEDRONATE IV KIT
20.6000 | PACK | Freq: Once | INTRAVENOUS | Status: AC | PRN
  Administered 2020-12-11: 20.6 via INTRAVENOUS

## 2020-12-12 ENCOUNTER — Encounter: Payer: Self-pay | Admitting: Adult Health

## 2020-12-12 ENCOUNTER — Encounter: Payer: Self-pay | Admitting: *Deleted

## 2020-12-13 ENCOUNTER — Encounter: Payer: Self-pay | Admitting: Adult Health

## 2020-12-13 ENCOUNTER — Inpatient Hospital Stay: Payer: BC Managed Care – PPO

## 2020-12-13 ENCOUNTER — Inpatient Hospital Stay: Payer: BC Managed Care – PPO | Admitting: Adult Health

## 2020-12-13 ENCOUNTER — Other Ambulatory Visit: Payer: Self-pay

## 2020-12-13 VITALS — BP 102/64 | HR 83 | Temp 97.9°F | Resp 18 | Ht 66.0 in | Wt 206.5 lb

## 2020-12-13 DIAGNOSIS — Z803 Family history of malignant neoplasm of breast: Secondary | ICD-10-CM | POA: Diagnosis not present

## 2020-12-13 DIAGNOSIS — Z170 Estrogen receptor positive status [ER+]: Secondary | ICD-10-CM

## 2020-12-13 DIAGNOSIS — Z5111 Encounter for antineoplastic chemotherapy: Secondary | ICD-10-CM | POA: Diagnosis not present

## 2020-12-13 DIAGNOSIS — Z801 Family history of malignant neoplasm of trachea, bronchus and lung: Secondary | ICD-10-CM | POA: Diagnosis not present

## 2020-12-13 DIAGNOSIS — Z79899 Other long term (current) drug therapy: Secondary | ICD-10-CM | POA: Diagnosis not present

## 2020-12-13 DIAGNOSIS — G459 Transient cerebral ischemic attack, unspecified: Secondary | ICD-10-CM | POA: Diagnosis not present

## 2020-12-13 DIAGNOSIS — Z923 Personal history of irradiation: Secondary | ICD-10-CM | POA: Diagnosis not present

## 2020-12-13 DIAGNOSIS — C50412 Malignant neoplasm of upper-outer quadrant of left female breast: Secondary | ICD-10-CM

## 2020-12-13 DIAGNOSIS — R5383 Other fatigue: Secondary | ICD-10-CM | POA: Diagnosis not present

## 2020-12-13 DIAGNOSIS — K5900 Constipation, unspecified: Secondary | ICD-10-CM | POA: Diagnosis not present

## 2020-12-13 DIAGNOSIS — Z8041 Family history of malignant neoplasm of ovary: Secondary | ICD-10-CM | POA: Diagnosis not present

## 2020-12-13 DIAGNOSIS — D6851 Activated protein C resistance: Secondary | ICD-10-CM | POA: Diagnosis not present

## 2020-12-13 DIAGNOSIS — F419 Anxiety disorder, unspecified: Secondary | ICD-10-CM | POA: Diagnosis not present

## 2020-12-13 DIAGNOSIS — Z95828 Presence of other vascular implants and grafts: Secondary | ICD-10-CM

## 2020-12-13 DIAGNOSIS — K219 Gastro-esophageal reflux disease without esophagitis: Secondary | ICD-10-CM | POA: Diagnosis not present

## 2020-12-13 LAB — COMPREHENSIVE METABOLIC PANEL
ALT: 35 U/L (ref 0–44)
AST: 29 U/L (ref 15–41)
Albumin: 3.8 g/dL (ref 3.5–5.0)
Alkaline Phosphatase: 71 U/L (ref 38–126)
Anion gap: 8 (ref 5–15)
BUN: 13 mg/dL (ref 6–20)
CO2: 25 mmol/L (ref 22–32)
Calcium: 9.3 mg/dL (ref 8.9–10.3)
Chloride: 106 mmol/L (ref 98–111)
Creatinine, Ser: 0.83 mg/dL (ref 0.44–1.00)
GFR, Estimated: >60 mL/min (ref >60)
Glucose, Bld: 118 mg/dL — ABNORMAL HIGH (ref 70–99)
Potassium: 3.8 mmol/L (ref 3.5–5.1)
Sodium: 139 mmol/L (ref 135–145)
Total Bilirubin: 0.3 mg/dL (ref 0.3–1.2)
Total Protein: 6.5 g/dL (ref 6.5–8.1)

## 2020-12-13 LAB — CBC WITH DIFFERENTIAL/PLATELET
Abs Immature Granulocytes: 0.02 10*3/uL (ref 0.00–0.07)
Basophils Absolute: 0.1 10*3/uL (ref 0.0–0.1)
Basophils Relative: 1 %
Eosinophils Absolute: 0.1 10*3/uL (ref 0.0–0.5)
Eosinophils Relative: 3 %
HCT: 37.3 % (ref 36.0–46.0)
Hemoglobin: 12.9 g/dL (ref 12.0–15.0)
Immature Granulocytes: 1 %
Lymphocytes Relative: 20 %
Lymphs Abs: 0.8 10*3/uL (ref 0.7–4.0)
MCH: 32.1 pg (ref 26.0–34.0)
MCHC: 34.6 g/dL (ref 30.0–36.0)
MCV: 92.8 fL (ref 80.0–100.0)
Monocytes Absolute: 0.3 10*3/uL (ref 0.1–1.0)
Monocytes Relative: 8 %
Neutro Abs: 2.6 10*3/uL (ref 1.7–7.7)
Neutrophils Relative %: 67 %
Platelets: 208 10*3/uL (ref 150–400)
RBC: 4.02 MIL/uL (ref 3.87–5.11)
RDW: 14.7 % (ref 11.5–15.5)
WBC: 3.8 10*3/uL — ABNORMAL LOW (ref 4.0–10.5)
nRBC: 0 % (ref 0.0–0.2)

## 2020-12-13 MED ORDER — SODIUM CHLORIDE 0.9% FLUSH
10.0000 mL | INTRAVENOUS | Status: DC | PRN
  Administered 2020-12-13: 10 mL
  Filled 2020-12-13: qty 10

## 2020-12-13 MED ORDER — SODIUM CHLORIDE 0.9 % IV SOLN
10.0000 mg | Freq: Once | INTRAVENOUS | Status: AC
  Administered 2020-12-13: 10 mg via INTRAVENOUS
  Filled 2020-12-13: qty 10

## 2020-12-13 MED ORDER — DIPHENHYDRAMINE HCL 50 MG/ML IJ SOLN
INTRAMUSCULAR | Status: AC
  Filled 2020-12-13: qty 1

## 2020-12-13 MED ORDER — FAMOTIDINE 20 MG IN NS 100 ML IVPB
20.0000 mg | Freq: Once | INTRAVENOUS | Status: AC
  Administered 2020-12-13: 20 mg via INTRAVENOUS

## 2020-12-13 MED ORDER — METHYLPREDNISOLONE SODIUM SUCC 125 MG IJ SOLR
INTRAMUSCULAR | Status: AC
  Filled 2020-12-13: qty 2

## 2020-12-13 MED ORDER — HEPARIN SOD (PORK) LOCK FLUSH 100 UNIT/ML IV SOLN
500.0000 [IU] | Freq: Once | INTRAVENOUS | Status: DC
  Filled 2020-12-13: qty 5

## 2020-12-13 MED ORDER — SODIUM CHLORIDE 0.9 % IV SOLN
45.0000 mg/m2 | Freq: Once | INTRAVENOUS | Status: AC
  Administered 2020-12-13: 96 mg via INTRAVENOUS
  Filled 2020-12-13: qty 16

## 2020-12-13 MED ORDER — SODIUM CHLORIDE 0.9 % IV SOLN
Freq: Once | INTRAVENOUS | Status: AC
  Filled 2020-12-13: qty 250

## 2020-12-13 MED ORDER — METHYLPREDNISOLONE SODIUM SUCC 125 MG IJ SOLR
60.0000 mg | Freq: Once | INTRAMUSCULAR | Status: AC
  Administered 2020-12-13: 60 mg via INTRAVENOUS

## 2020-12-13 MED ORDER — SODIUM CHLORIDE 0.9% FLUSH
10.0000 mL | Freq: Once | INTRAVENOUS | Status: AC
  Administered 2020-12-13: 10 mL
  Filled 2020-12-13: qty 10

## 2020-12-13 MED ORDER — LORAZEPAM 2 MG/ML IJ SOLN
INTRAMUSCULAR | Status: AC
  Filled 2020-12-13: qty 1

## 2020-12-13 MED ORDER — LORAZEPAM 2 MG/ML IJ SOLN
1.0000 mg | Freq: Once | INTRAMUSCULAR | Status: AC
  Administered 2020-12-13: 1 mg via INTRAVENOUS

## 2020-12-13 MED ORDER — FAMOTIDINE 20 MG IN NS 100 ML IVPB
INTRAVENOUS | Status: AC
  Filled 2020-12-13: qty 100

## 2020-12-13 MED ORDER — SODIUM CHLORIDE 0.9 % IV SOLN
Freq: Once | INTRAVENOUS | Status: AC
Start: 2020-12-13 — End: 2020-12-13
  Filled 2020-12-13: qty 250

## 2020-12-13 MED ORDER — DIPHENHYDRAMINE HCL 50 MG/ML IJ SOLN
25.0000 mg | Freq: Once | INTRAMUSCULAR | Status: AC
  Administered 2020-12-13: 25 mg via INTRAVENOUS

## 2020-12-13 MED ORDER — HEPARIN SOD (PORK) LOCK FLUSH 100 UNIT/ML IV SOLN
500.0000 [IU] | Freq: Once | INTRAVENOUS | Status: AC | PRN
  Administered 2020-12-13: 500 [IU]
  Filled 2020-12-13: qty 5

## 2020-12-13 NOTE — Patient Instructions (Signed)
[facility] MEDICAL [facility]COLOGY  Discharge Instructions: °Thank you for choosing [facility] to provide your oncology and hematology care.  ° °If you have a lab appointment with the Cancer Center, please go directly to the Cancer Center and check in at the registration area. °  °Wear comfortable clothing and clothing appropriate for easy access to any Portacath or PICC line.  ° °We strive to give you quality time with your provider. You may need to reschedule your appointment if you arrive late (15 or more minutes).  Arriving late affects you and other patients whose appointments are after yours.  Also, if you miss three or more appointments without notifying the office, you may be dismissed from the clinic at the provider’s discretion.    °  °For prescription refill requests, have your pharmacy contact our office and allow 72 hours for refills to be completed.   ° °Today you received the following chemotherapy and/or immunotherapy agents: Taxol   °  °To help prevent nausea and vomiting after your treatment, we encourage you to take your nausea medication as directed. ° °BELOW ARE SYMPTOMS THAT SHOULD BE REPORTED IMMEDIATELY: °*FEVER GREATER THAN 100.4 F (38 °C) OR HIGHER °*CHILLS OR SWEATING °*NAUSEA AND VOMITING THAT IS NOT CONTROLLED WITH YOUR NAUSEA MEDICATION °*UNUSUAL SHORTNESS OF BREATH °*UNUSUAL BRUISING OR BLEEDING °*URINARY PROBLEMS (pain or burning when urinating, or frequent urination) °*BOWEL PROBLEMS (unusual diarrhea, constipation, pain near the anus) °TENDERNESS IN MOUTH AND THROAT WITH OR WITHOUT PRESENCE OF ULCERS (sore throat, sores in mouth, or a toothache) °UNUSUAL RASH, SWELLING OR PAIN  °UNUSUAL VAGINAL DISCHARGE OR ITCHING  ° °Items with * indicate a potential emergency and should be followed up as soon as possible or go to the Emergency Department if any problems should occur. ° °Please show the CHEMOTHERAPY ALERT CARD or IMMUNOTHERAPY ALERT CARD at check-in to the  Emergency Department and triage nurse. ° °Should you have questions after your visit or need to cancel or reschedule your appointment, please contact [facility]  Dept: [phone]  and follow the prompts.  Office hours are 8:00 a.m. to 4:30 p.m. Monday - Friday. Please note that voicemails left after 4:00 p.m. may not be returned until the following business day.  We are closed weekends and major holidays. You have access to a nurse at all times for urgent questions. Please call the main number to the clinic Dept: [phone] and follow the prompts. ° ° °For any non-urgent questions, you may also contact your provider using MyChart. We now offer e-Visits for anyone 18 and older to request care online for non-urgent symptoms. For details visit [email or web address]. °  °Also download the MyChart app! Go to the app store, search "MyChart", open the app, select [facility], and log in with your MyChart username and password. ° °Due to Covid, a mask is required upon entering the hospital/clinic. If you do not have a mask, one will be given to you upon arrival. For doctor visits, patients may have 1 support person aged 18 or older with them. For treatment visits, patients cannot have anyone with them due to current Covid guidelines and our immunocompromised population.  ° °

## 2020-12-13 NOTE — Assessment & Plan Note (Signed)
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 ------------------------------------------------------------------------------------------------------------------------- Current treatment: Completed 4 cycles ofdose dense Adriamycin Cytoxan, today is cycle 7 Taxol Echocardiogram2/2/22: EF 60-65%  Chemo Toxicities:  Tracy Dyer continues to do well.  She has no issues with tolerating taxol. She has no peripheral neuropathy or further concerns.  She denies any new issues.  She is relieved to hear her bone scan was negative.  I reviewed her labs with her today.    Return to clinic weekly for Taxol and every other week for follow-up with me

## 2020-12-13 NOTE — Progress Notes (Signed)
[name]:    Tracy Pepper, MD [address] [place] [address]   DIAGNOSIS: Cancer Staging Malignant neoplasm of upper-outer quadrant of left breast in female, estrogen receptor positive ([place]) Staging form: Breast, AJCC 8th Edition - Clinical stage from 08/21/2020: Stage IIA (cT1c, cN1, cM0, G3, ER+, PR+, HER2-) - Signed by [name], MD on 08/21/2020   SUMMARY OF ONCOLOGIC HISTORY: Oncology History  Malignant neoplasm of upper-outer quadrant of left breast in female, estrogen receptor positive ([place])  08/21/2020 Initial Diagnosis   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 No pathogenic variants identified on the [facility] Breast Cancer STAT Panel + Multi-Cancer Panel. VUS in [facility] called c.983C>T identified. The report date is 09/04/2020.   The STAT Breast cancer panel offered by [facility] includes sequencing and rearrangement analysis for the following 9 genes:  ATM, BRCA1, BRCA2, CDH1, CHEK2, PALB2, PTEN, STK11 and TP53.    The Multi-Cancer Panel offered by [facility] includes sequencing and/or deletion duplication testing of the following 85 genes: AIP, ALK, APC, ATM, AXIN2,BAP1,  BARD1, BLM, BMPR1A, BRCA1, BRCA2, BRIP1, CASR, CDC73, CDH1, CDK4, CDKN1B, CDKN1C, CDKN2A (p14ARF), CDKN2A (p16INK4a), CEBPA, CHEK2, CTNNA1, DICER1, DIS3L2, EGFR (c.2369C>T, p.Thr790Met  variant only), EPCAM (Deletion/duplication testing only), FH, FLCN, GATA2, GPC3, GREM1 (Promoter region deletion/duplication testing only), HOXB13 (c.251G>A, p.Gly84Glu), HRAS, KIT, MAX, MEN1, MET, MITF (c.952G>A, p.Glu318Lys variant only), MLH1, MSH2, MSH3, MSH6, MUTYH, NBN, NF1, NF2, NTHL1, PALB2, PDGFRA, PHOX2B, PMS2, POLD1, POLE, POT1, PRKAR1A, PTCH1, PTEN, RAD50, RAD51C, RAD51D, RB1, RECQL4, RET, RNF43, RUNX1, SDHAF2, SDHA (sequence changes only), SDHB, SDHC, SDHD, SMAD4, SMARCA4, SMARCB1, SMARCE1, STK11, SUFU, TERC, TERT, TMEM127, TP53, TSC1, TSC2, VHL, WRN and WT1.    09/06/2020 -  Chemotherapy    Patient is on Treatment Plan: BREAST ADJUVANT DOSE DENSE AC Q14D / PACLITAXEL Q7D        CURRENT THERAPY: Taxol week 7  INTERVAL HISTORY: Tracy Dyer 50 y.o. female returns for evaluation prior to receiving her seventh cycle of neoadjuvant chemotherapy.  She is doing quite well today.  At her last visit on 5/2 she had abdominal films that showed mild constipation, and calcific density over the sacrum and right iliac wing.  Bone scan on 12/11/2020 shows no evidence of metastatic disease.  She received her results yesterday and was tearful and happy.    Tracy Dyer denies any peripheral neuropathy.  She cannot feel her breast cancer tumor.  She has no physical concerns today.     Patient Active Problem List   Diagnosis Date Noted  . Port-A-Cath in place 09/20/2020  . Genetic testing 09/04/2020  . Family history of breast cancer   . Family history of ovarian cancer   . Family history of lung cancer   . Malignant neoplasm of upper-outer quadrant of left breast in female, estrogen receptor positive ([place]) 08/21/2020  . Anxiety 11/16/2013  . Hypercoagulable state ([place]) 11/16/2013  .  TIA (transient ischemic attack) 11/15/2013  . Chest discomfort 01/20/2012  . Factor V Leiden ([place]) 01/20/2012  . MVP (mitral valve prolapse) 01/20/2012  . Acute bronchitis 12/23/2011    is allergic to sulfa  antibiotics, hydromorphone, oxycodone, propoxyphene, and sulfamethoxazole.  MEDICAL HISTORY: Past Medical History:  Diagnosis Date  . Anxiety   . Cancer [facility])    breast  . Family history of breast cancer   . Family history of lung cancer   . Family history of ovarian cancer   . GERD (gastroesophageal reflux disease)    diet controlled  . Seasonal allergies     SURGICAL HISTORY: Past Surgical History:  Procedure Laterality Date  . BREAST BIOPSY Left 2019   x 2 biopsy  . BREAST EXCISIONAL BIOPSY Left   . CHOLECYSTECTOMY  2008  . PORTACATH PLACEMENT N/A 09/05/2020   Procedure: INSERTION PORT-A-CATH;  Surgeon: [name], MD;  Location: [facility];  Service: General;  Laterality: N/A;  PEC BLOCK  . WISDOM TOOTH EXTRACTION      SOCIAL HISTORY: Social History   Socioeconomic History  . Marital status: Married    Spouse name: Not on file  . Number of children: Not on file  . Years of education: Not on file  . Highest education level: Not on file  Occupational History  . Not on file  Tobacco Use  . Smoking status: Never Smoker  . Smokeless tobacco: Never Used  Vaping Use  . Vaping Use: Never used  Substance and Sexual Activity  . Alcohol use: Not Currently  . Drug use: Never  . Sexual activity: Yes    Birth control/protection: None  Other Topics Concern  . Not on file  Social History Narrative  . Not on file   Social Determinants of Health   Financial Resource Strain: Not on file  Food Insecurity: No Food Insecurity  . Worried About Charity fundraiser in the Last Year: Never true  . Ran Out of Food in the Last Year: Never true  Transportation Needs: No Transportation Needs  . Lack of Transportation (Medical): No  . Lack of Transportation (Non-Medical): No  Physical Activity: Not on file  Stress: Not on file  Social Connections: Not on file  Intimate Partner Violence: Not on file    FAMILY HISTORY: Family History  Problem Relation Age of Onset  .  Eczema Father   . Allergic rhinitis Brother   . Breast cancer Mother        in 47's  . Cancer Maternal Uncle        unk type  . Lung cancer Maternal Grandfather 41  . Heart Problems Paternal Grandfather   . Melanoma Cousin   . Ovarian cancer Paternal Great-grandmother   . Cancer Paternal Great-grandmother        GYN cancer, possibly ovarian?  . Cancer Maternal Aunt        half aunts/uncles x5- rare cancers    Review of Systems  Constitutional: Negative for appetite change, chills, fatigue, fever and unexpected weight change.  HENT:   Negative for hearing loss, lump/mass and trouble swallowing.   Eyes: Negative for eye problems and icterus.  Respiratory: Negative for chest tightness, cough and shortness of breath.   Cardiovascular: Negative for chest pain, leg swelling and palpitations.  Gastrointestinal: Negative for abdominal distention, abdominal pain, constipation, diarrhea, nausea and vomiting.  Endocrine: Negative for hot flashes.  Genitourinary: Negative for difficulty urinating.   Musculoskeletal: Negative for arthralgias.  Skin: Negative for itching  and rash.  Neurological: Negative for dizziness, extremity weakness, headaches and numbness.  Hematological: Negative for adenopathy. Does not bruise/bleed easily.  Psychiatric/Behavioral: Negative for depression. The patient is nervous/anxious.   All other systems reviewed and are negative.     PHYSICAL EXAMINATION  ECOG PERFORMANCE STATUS: 1 - Symptomatic but completely ambulatory  Vitals:   12/13/20 0929  BP: 102/64  Pulse: 83  Resp: 18  Temp: 97.9 F (36.6 C)  SpO2: 97%    Physical Exam Constitutional:      General: She is not in acute distress.    Appearance: Normal appearance. She is not toxic-appearing.  HENT:     Head: Normocephalic and atraumatic.  Eyes:     General: No scleral icterus. Cardiovascular:     Rate and Rhythm: Normal rate and regular rhythm.     Pulses: Normal pulses.     Heart  sounds: Normal heart sounds.  Pulmonary:     Effort: Pulmonary effort is normal.     Breath sounds: Normal breath sounds.  Abdominal:     General: Abdomen is flat. Bowel sounds are normal. There is no distension.     Palpations: Abdomen is soft.     Tenderness: There is no abdominal tenderness.  Musculoskeletal:        General: No swelling.     Cervical back: Neck supple.  Lymphadenopathy:     Cervical: No cervical adenopathy.  Skin:    General: Skin is warm and dry.     Findings: No rash.  Neurological:     General: No focal deficit present.     Mental Status: She is alert.  Psychiatric:        Mood and Affect: Mood normal.        Behavior: Behavior normal.     LABORATORY DATA:  CBC    Component Value Date/Time   WBC 3.8 (L) 12/13/2020 0914   RBC 4.02 12/13/2020 0914   HGB 12.9 12/13/2020 0914   HGB 13.7 09/13/2020 1048   HCT 37.3 12/13/2020 0914   PLT 208 12/13/2020 0914   PLT 176 09/13/2020 1048   MCV 92.8 12/13/2020 0914   MCH 32.1 12/13/2020 0914   MCHC 34.6 12/13/2020 0914   RDW 14.7 12/13/2020 0914   LYMPHSABS 0.8 12/13/2020 0914   MONOABS 0.3 12/13/2020 0914   EOSABS 0.1 12/13/2020 0914   BASOSABS 0.1 12/13/2020 0914    CMP     Component Value Date/Time   NA 139 12/13/2020 0914   K 3.8 12/13/2020 0914   CL 106 12/13/2020 0914   CO2 25 12/13/2020 0914   GLUCOSE 118 (H) 12/13/2020 0914   BUN 13 12/13/2020 0914   CREATININE 0.83 12/13/2020 0914   CREATININE 0.92 11/27/2020 1622   CALCIUM 9.3 12/13/2020 0914   PROT 6.5 12/13/2020 0914   ALBUMIN 3.8 12/13/2020 0914   AST 29 12/13/2020 0914   AST 38 11/27/2020 1622   ALT 35 12/13/2020 0914   ALT 53 (H) 11/27/2020 1622   ALKPHOS 71 12/13/2020 0914   BILITOT 0.3 12/13/2020 0914   BILITOT 0.5 11/27/2020 1622   GFRNONAA >60 12/13/2020 0914   GFRNONAA >60 11/27/2020 1622    RADIOGRAPHIC STUDIES:  NM Bone Scan Whole Body  Result Date: 12/12/2020 CLINICAL DATA:  Breast cancer staging. EXAM:  NUCLEAR MEDICINE WHOLE BODY BONE SCAN TECHNIQUE: Whole body anterior and posterior images were obtained approximately 3 hours after intravenous injection of radiopharmaceutical. RADIOPHARMACEUTICALS:  20.6 mCi Technetium-75mMDP IV COMPARISON:  None. FINDINGS:  Increased tracer uptake in both knees compatible with degenerative changes. The remainder of the tracer distribution throughout the bony skeleton is normal. Normal renal and bladder activity. IMPRESSION: No evidence of metastatic disease. Electronically Signed   By: Claudie Revering M.D.   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 ------------------------------------------------------------------------------------------------------------------------- Current treatment: Completed 4 cycles ofdose dense Adriamycin Cytoxan, today is cycle 7 Taxol Echocardiogram2/2/22: EF 60-65%  Chemo Toxicities:  Tracy Dyer continues to do well.  She has no issues with tolerating taxol. She has no  peripheral neuropathy or further concerns.  She denies any new issues.  She is relieved to hear her bone scan was negative.  I reviewed her labs with her today.    Return to clinic weekly for Taxol and every other week for follow-up with me   All questions were answered. The patient knows to call the clinic with any problems, questions or concerns. We can certainly see the patient much sooner if necessary.  Total encounter time: 25* minutes including reviewing radiology results, face to face visit time, discussing treatment, and documenting the encounter.  Wilber Bihari, NP 12/13/20 10:18 AM Medical Oncology and Hematology [facility] [place], [place] [address] Tel. [phone]    Fax. [phone]  *Total Encounter Time as defined by the Centers for Medicare and Medicaid Services includes, in addition to the face-to-face time of a patient visit (documented in the note above) non-face-to-face time: obtaining and reviewing outside history, ordering and reviewing medications, tests or procedures, care coordination (communications with other health care professionals or caregivers) and documentation in the medical record.

## 2020-12-20 ENCOUNTER — Inpatient Hospital Stay: Payer: BC Managed Care – PPO

## 2020-12-20 ENCOUNTER — Other Ambulatory Visit: Payer: Self-pay

## 2020-12-20 VITALS — BP 101/54 | HR 71 | Temp 97.9°F | Resp 16

## 2020-12-20 DIAGNOSIS — D6851 Activated protein C resistance: Secondary | ICD-10-CM | POA: Diagnosis not present

## 2020-12-20 DIAGNOSIS — Z170 Estrogen receptor positive status [ER+]: Secondary | ICD-10-CM | POA: Diagnosis not present

## 2020-12-20 DIAGNOSIS — C50412 Malignant neoplasm of upper-outer quadrant of left female breast: Secondary | ICD-10-CM

## 2020-12-20 DIAGNOSIS — Z801 Family history of malignant neoplasm of trachea, bronchus and lung: Secondary | ICD-10-CM | POA: Diagnosis not present

## 2020-12-20 DIAGNOSIS — Z803 Family history of malignant neoplasm of breast: Secondary | ICD-10-CM | POA: Diagnosis not present

## 2020-12-20 DIAGNOSIS — Z79899 Other long term (current) drug therapy: Secondary | ICD-10-CM | POA: Diagnosis not present

## 2020-12-20 DIAGNOSIS — K219 Gastro-esophageal reflux disease without esophagitis: Secondary | ICD-10-CM | POA: Diagnosis not present

## 2020-12-20 DIAGNOSIS — K5900 Constipation, unspecified: Secondary | ICD-10-CM | POA: Diagnosis not present

## 2020-12-20 DIAGNOSIS — Z8041 Family history of malignant neoplasm of ovary: Secondary | ICD-10-CM | POA: Diagnosis not present

## 2020-12-20 DIAGNOSIS — F419 Anxiety disorder, unspecified: Secondary | ICD-10-CM | POA: Diagnosis not present

## 2020-12-20 DIAGNOSIS — G459 Transient cerebral ischemic attack, unspecified: Secondary | ICD-10-CM | POA: Diagnosis not present

## 2020-12-20 DIAGNOSIS — R5383 Other fatigue: Secondary | ICD-10-CM | POA: Diagnosis not present

## 2020-12-20 DIAGNOSIS — Z923 Personal history of irradiation: Secondary | ICD-10-CM | POA: Diagnosis not present

## 2020-12-20 DIAGNOSIS — Z5111 Encounter for antineoplastic chemotherapy: Secondary | ICD-10-CM | POA: Diagnosis not present

## 2020-12-20 LAB — CBC WITH DIFFERENTIAL/PLATELET
Abs Immature Granulocytes: 0.02 10*3/uL (ref 0.00–0.07)
Basophils Absolute: 0.1 10*3/uL (ref 0.0–0.1)
Basophils Relative: 1 %
Eosinophils Absolute: 0.1 10*3/uL (ref 0.0–0.5)
Eosinophils Relative: 2 %
HCT: 36.1 % (ref 36.0–46.0)
Hemoglobin: 12.8 g/dL (ref 12.0–15.0)
Immature Granulocytes: 1 %
Lymphocytes Relative: 22 %
Lymphs Abs: 0.9 10*3/uL (ref 0.7–4.0)
MCH: 32.4 pg (ref 26.0–34.0)
MCHC: 35.5 g/dL (ref 30.0–36.0)
MCV: 91.4 fL (ref 80.0–100.0)
Monocytes Absolute: 0.4 10*3/uL (ref 0.1–1.0)
Monocytes Relative: 11 %
Neutro Abs: 2.5 10*3/uL (ref 1.7–7.7)
Neutrophils Relative %: 63 %
Platelets: 223 10*3/uL (ref 150–400)
RBC: 3.95 MIL/uL (ref 3.87–5.11)
RDW: 13.8 % (ref 11.5–15.5)
WBC: 3.9 10*3/uL — ABNORMAL LOW (ref 4.0–10.5)
nRBC: 0 % (ref 0.0–0.2)

## 2020-12-20 LAB — CMP (CANCER CENTER ONLY)
ALT: 47 U/L — ABNORMAL HIGH (ref 0–44)
AST: 44 U/L — ABNORMAL HIGH (ref 15–41)
Albumin: 3.9 g/dL (ref 3.5–5.0)
Alkaline Phosphatase: 65 U/L (ref 38–126)
Anion gap: 8 (ref 5–15)
BUN: 16 mg/dL (ref 6–20)
CO2: 24 mmol/L (ref 22–32)
Calcium: 8.7 mg/dL — ABNORMAL LOW (ref 8.9–10.3)
Chloride: 104 mmol/L (ref 98–111)
Creatinine: 0.87 mg/dL (ref 0.44–1.00)
GFR, Estimated: >60 mL/min (ref >60)
Glucose, Bld: 93 mg/dL (ref 70–99)
Potassium: 3.9 mmol/L (ref 3.5–5.1)
Sodium: 136 mmol/L (ref 135–145)
Total Bilirubin: 0.3 mg/dL (ref 0.3–1.2)
Total Protein: 6.7 g/dL (ref 6.5–8.1)

## 2020-12-20 MED ORDER — FAMOTIDINE IN NACL 20-0.9 MG/50ML-% IV SOLN: 20.0000 mg | Freq: Once | INTRAVENOUS | Status: DC

## 2020-12-20 MED ORDER — DIPHENHYDRAMINE HCL 50 MG/ML IJ SOLN
25.0000 mg | Freq: Once | INTRAMUSCULAR | Status: AC
  Administered 2020-12-20: 25 mg via INTRAVENOUS

## 2020-12-20 MED ORDER — SODIUM CHLORIDE 0.9 % IV SOLN
Freq: Once | INTRAVENOUS | Status: AC
  Filled 2020-12-20: qty 250

## 2020-12-20 MED ORDER — METHYLPREDNISOLONE SODIUM SUCC 125 MG IJ SOLR
60.0000 mg | Freq: Once | INTRAMUSCULAR | Status: AC
  Administered 2020-12-20: 60 mg via INTRAVENOUS

## 2020-12-20 MED ORDER — DEXAMETHASONE SODIUM PHOSPHATE 100 MG/10ML IJ SOLN
10.0000 mg | Freq: Once | INTRAMUSCULAR | Status: AC
  Administered 2020-12-20: 10 mg via INTRAVENOUS
  Filled 2020-12-20: qty 10

## 2020-12-20 MED ORDER — LORAZEPAM 2 MG/ML IJ SOLN
INTRAMUSCULAR | Status: AC
  Filled 2020-12-20: qty 1

## 2020-12-20 MED ORDER — SODIUM CHLORIDE 0.9 % IV SOLN
20.0000 mg | Freq: Once | INTRAVENOUS | Status: AC
  Administered 2020-12-20: 20 mg via INTRAVENOUS
  Filled 2020-12-20: qty 20

## 2020-12-20 MED ORDER — SODIUM CHLORIDE 0.9% FLUSH
10.0000 mL | INTRAVENOUS | Status: DC | PRN
  Administered 2020-12-20: 10 mL
  Filled 2020-12-20: qty 10

## 2020-12-20 MED ORDER — METHYLPREDNISOLONE SODIUM SUCC 125 MG IJ SOLR
INTRAMUSCULAR | Status: AC
  Filled 2020-12-20: qty 2

## 2020-12-20 MED ORDER — LORAZEPAM 2 MG/ML IJ SOLN
1.0000 mg | Freq: Once | INTRAMUSCULAR | Status: AC
Start: 2020-12-20 — End: 2020-12-20
  Administered 2020-12-20: 1 mg via INTRAVENOUS

## 2020-12-20 MED ORDER — DIPHENHYDRAMINE HCL 50 MG/ML IJ SOLN
INTRAMUSCULAR | Status: AC
  Filled 2020-12-20: qty 1

## 2020-12-20 MED ORDER — SODIUM CHLORIDE 0.9 % IV SOLN
45.0000 mg/m2 | Freq: Once | INTRAVENOUS | Status: AC
  Administered 2020-12-20: 96 mg via INTRAVENOUS
  Filled 2020-12-20: qty 16

## 2020-12-20 MED ORDER — HEPARIN SOD (PORK) LOCK FLUSH 100 UNIT/ML IV SOLN
500.0000 [IU] | Freq: Once | INTRAVENOUS | Status: AC | PRN
  Administered 2020-12-20: 500 [IU]
  Filled 2020-12-20: qty 5

## 2020-12-20 NOTE — Patient Instructions (Signed)

## 2020-12-20 NOTE — Patient Instructions (Addendum)
Paclitaxel injection What is this medicine? PACLITAXEL (PAK li TAX el) is a chemotherapy drug. It targets fast dividing cells, like cancer cells, and causes these cells to die. This medicine is used to treat ovarian cancer, breast cancer, lung cancer, Kaposi's sarcoma, and other cancers. This medicine may be used for other purposes; ask your health care provider or pharmacist if you have questions. COMMON BRAND NAME(S): Onxol, Taxol What should I tell my health care provider before I take this medicine? They need to know if you have any of these conditions:  history of irregular heartbeat  liver disease  low blood counts, like low white cell, platelet, or red cell counts  lung or breathing disease, like asthma  tingling of the fingers or toes, or other nerve disorder  an unusual or allergic reaction to paclitaxel, alcohol, polyoxyethylated castor oil, other chemotherapy, other medicines, foods, dyes, or preservatives  pregnant or trying to get pregnant  breast-feeding How should I use this medicine? This drug is given as an infusion into a vein. It is administered in a hospital or clinic by a specially trained health care professional. Talk to your pediatrician regarding the use of this medicine in children. Special care may be needed. Overdosage: If you think you have taken too much of this medicine contact a poison control center or emergency room at once. NOTE: This medicine is only for you. Do not share this medicine with others. What if I miss a dose? It is important not to miss your dose. Call your doctor or health care professional if you are unable to keep an appointment. What may interact with this medicine? Do not take this medicine with any of the following medications:  live virus vaccines This medicine may also interact with the following medications:  antiviral medicines for hepatitis, HIV or AIDS  certain antibiotics like erythromycin and clarithromycin  certain  medicines for fungal infections like ketoconazole and itraconazole  certain medicines for seizures like carbamazepine, phenobarbital, phenytoin  gemfibrozil  nefazodone  rifampin  St. John's wort This list may not describe all possible interactions. Give your health care provider a list of all the medicines, herbs, non-prescription drugs, or dietary supplements you use. Also tell them if you smoke, drink alcohol, or use illegal drugs. Some items may interact with your medicine. What should I watch for while using this medicine? Your condition will be monitored carefully while you are receiving this medicine. You will need important blood work done while you are taking this medicine. This medicine can cause serious allergic reactions. To reduce your risk you will need to take other medicine(s) before treatment with this medicine. If you experience allergic reactions like skin rash, itching or hives, swelling of the face, lips, or tongue, tell your doctor or health care professional right away. In some cases, you may be given additional medicines to help with side effects. Follow all directions for their use. This drug may make you feel generally unwell. This is not uncommon, as chemotherapy can affect healthy cells as well as cancer cells. Report any side effects. Continue your course of treatment even though you feel ill unless your doctor tells you to stop. Call your doctor or health care professional for advice if you get a fever, chills or sore throat, or other symptoms of a cold or flu. Do not treat yourself. This drug decreases your body's ability to fight infections. Try to avoid being around people who are sick. This medicine may increase your risk to bruise  or bleed. Call your doctor or health care professional if you notice any unusual bleeding. Be careful brushing and flossing your teeth or using a toothpick because you may get an infection or bleed more easily. If you have any dental  work done, tell your dentist you are receiving this medicine. Avoid taking products that contain aspirin, acetaminophen, ibuprofen, naproxen, or ketoprofen unless instructed by your doctor. These medicines may hide a fever. Do not become pregnant while taking this medicine. Women should inform their doctor if they wish to become pregnant or think they might be pregnant. There is a potential for serious side effects to an unborn child. Talk to your health care professional or pharmacist for more information. Do not breast-feed an infant while taking this medicine. Men are advised not to father a child while receiving this medicine. This product may contain alcohol. Ask your pharmacist or healthcare provider if this medicine contains alcohol. Be sure to tell all healthcare providers you are taking this medicine. Certain medicines, like metronidazole and disulfiram, can cause an unpleasant reaction when taken with alcohol. The reaction includes flushing, headache, nausea, vomiting, sweating, and increased thirst. The reaction can last from 30 minutes to several hours. What side effects may I notice from receiving this medicine? Side effects that you should report to your doctor or health care professional as soon as possible:  allergic reactions like skin rash, itching or hives, swelling of the face, lips, or tongue  breathing problems  changes in vision  fast, irregular heartbeat  high or low blood pressure  mouth sores  pain, tingling, numbness in the hands or feet  signs of decreased platelets or bleeding - bruising, pinpoint red spots on the skin, black, tarry stools, blood in the urine  signs of decreased red blood cells - unusually weak or tired, feeling faint or lightheaded, falls  signs of infection - fever or chills, cough, sore throat, pain or difficulty passing urine  signs and symptoms of liver injury like dark yellow or brown urine; general ill feeling or flu-like symptoms;  light-colored stools; loss of appetite; nausea; right upper belly pain; unusually weak or tired; yellowing of the eyes or skin  swelling of the ankles, feet, hands  unusually slow heartbeat Side effects that usually do not require medical attention (report to your doctor or health care professional if they continue or are bothersome):  diarrhea  hair loss  loss of appetite  muscle or joint pain  nausea, vomiting  pain, redness, or irritation at site where injected  tiredness This list may not describe all possible side effects. Call your doctor for medical advice about side effects. You may report side effects to FDA at 1-800-FDA-1088. Where should I keep my medicine? This drug is given in a hospital or clinic and will not be stored at home. NOTE: This sheet is a summary. It may not cover all possible information. If you have questions about this medicine, talk to your doctor, pharmacist, or health care provider.  2021 [employer]/Gold Standard (2019-06-21 13:37:23) [place]  Discharge Instructions: Thank you for choosing [facility] to provide your oncology and hematology care.   If you have a lab appointment with the [facility], please go directly to the [facility] and check in at the registration area.  Wear comfortable clothing and clothing appropriate for easy access to any Portacath or PICC line.   We strive to give you quality time with your provider. You  may need to reschedule your appointment if you arrive late (15 or more minutes).  Arriving late affects you and other patients whose appointments are after yours.  Also, if you miss three or more appointments without notifying the office, you may be dismissed from the clinic at the provider's discretion.      For prescription refill requests, have your pharmacy contact our office and allow 72 hours for refills to be completed.    Today you received the following chemotherapy  and/or immunotherapy agents Taxol    To help prevent nausea and vomiting after your treatment, we encourage you to take your nausea medication as directed.  BELOW ARE SYMPTOMS THAT SHOULD BE REPORTED IMMEDIATELY: . *FEVER GREATER THAN 100.4 F (38 C) OR HIGHER . *CHILLS OR SWEATING . *NAUSEA AND VOMITING THAT IS NOT CONTROLLED WITH YOUR NAUSEA MEDICATION . *UNUSUAL SHORTNESS OF BREATH . *UNUSUAL BRUISING OR BLEEDING . *URINARY PROBLEMS (pain or burning when urinating, or frequent urination) . *BOWEL PROBLEMS (unusual diarrhea, constipation, pain near the anus) . TENDERNESS IN MOUTH AND THROAT WITH OR WITHOUT PRESENCE OF ULCERS (sore throat, sores in mouth, or a toothache) . UNUSUAL RASH, SWELLING OR PAIN  . UNUSUAL VAGINAL DISCHARGE OR ITCHING   Items with * indicate a potential emergency and should be followed up as soon as possible or go to the Emergency Department if any problems should occur.  Please show the CHEMOTHERAPY ALERT CARD or IMMUNOTHERAPY ALERT CARD at check-in to the Emergency Department and triage nurse. Should you have questions after your visit or need to cancel or reschedule your appointment, please contact [facility]  [phone] and follow the prompts.  Office hours are 8:00 a.m. to 4:30 p.m. Monday - Friday. Please note that voicemails left after 4:00 p.m. may not be returned until the following business day.  We are closed weekends and major holidays. You have access to a nurse at all times for urgent questions. Please call the main number to the clinic [phone] and follow the prompts.  For any non-urgent questions, you may also contact your provider using MyChart. We now offer e-Visits for anyone 12 and older to request care online for non-urgent symptoms. For details visit [email or web address].   Also download the MyChart app! Go to the app store, search "MyChart", open the app, select [facility], and log in with your MyChart  username and password.  Due to Covid, a mask is required upon entering the hospital/clinic. If you do not have a mask, one will be given to you upon arrival. For doctor visits, patients may have 1 support person aged 31 or older with them. For treatment visits, patients cannot have anyone with them due to current Covid guidelines and our immunocompromised population.

## 2020-12-27 ENCOUNTER — Encounter: Payer: Self-pay | Admitting: Adult Health

## 2020-12-27 ENCOUNTER — Inpatient Hospital Stay: Payer: BC Managed Care – PPO

## 2020-12-27 ENCOUNTER — Other Ambulatory Visit: Payer: Self-pay

## 2020-12-27 ENCOUNTER — Inpatient Hospital Stay: Payer: BC Managed Care – PPO | Admitting: Adult Health

## 2020-12-27 VITALS — BP 127/91 | HR 62 | Temp 97.9°F | Resp 20 | Wt 209.2 lb

## 2020-12-27 DIAGNOSIS — Z923 Personal history of irradiation: Secondary | ICD-10-CM | POA: Diagnosis not present

## 2020-12-27 DIAGNOSIS — Z5111 Encounter for antineoplastic chemotherapy: Secondary | ICD-10-CM | POA: Diagnosis not present

## 2020-12-27 DIAGNOSIS — F419 Anxiety disorder, unspecified: Secondary | ICD-10-CM | POA: Diagnosis not present

## 2020-12-27 DIAGNOSIS — C50412 Malignant neoplasm of upper-outer quadrant of left female breast: Secondary | ICD-10-CM

## 2020-12-27 DIAGNOSIS — Z79899 Other long term (current) drug therapy: Secondary | ICD-10-CM | POA: Diagnosis not present

## 2020-12-27 DIAGNOSIS — Z170 Estrogen receptor positive status [ER+]: Secondary | ICD-10-CM

## 2020-12-27 DIAGNOSIS — Z8041 Family history of malignant neoplasm of ovary: Secondary | ICD-10-CM | POA: Diagnosis not present

## 2020-12-27 DIAGNOSIS — K219 Gastro-esophageal reflux disease without esophagitis: Secondary | ICD-10-CM | POA: Diagnosis not present

## 2020-12-27 DIAGNOSIS — Z803 Family history of malignant neoplasm of breast: Secondary | ICD-10-CM | POA: Diagnosis not present

## 2020-12-27 DIAGNOSIS — Z801 Family history of malignant neoplasm of trachea, bronchus and lung: Secondary | ICD-10-CM | POA: Diagnosis not present

## 2020-12-27 DIAGNOSIS — D6851 Activated protein C resistance: Secondary | ICD-10-CM | POA: Diagnosis not present

## 2020-12-27 DIAGNOSIS — K5900 Constipation, unspecified: Secondary | ICD-10-CM | POA: Diagnosis not present

## 2020-12-27 DIAGNOSIS — G459 Transient cerebral ischemic attack, unspecified: Secondary | ICD-10-CM | POA: Diagnosis not present

## 2020-12-27 DIAGNOSIS — R5383 Other fatigue: Secondary | ICD-10-CM | POA: Diagnosis not present

## 2020-12-27 LAB — CBC WITH DIFFERENTIAL (CANCER CENTER ONLY)
Abs Immature Granulocytes: 0.02 10*3/uL (ref 0.00–0.07)
Basophils Absolute: 0.1 10*3/uL (ref 0.0–0.1)
Basophils Relative: 1 %
Eosinophils Absolute: 0.1 10*3/uL (ref 0.0–0.5)
Eosinophils Relative: 2 %
HCT: 34.9 % — ABNORMAL LOW (ref 36.0–46.0)
Hemoglobin: 12 g/dL (ref 12.0–15.0)
Immature Granulocytes: 1 %
Lymphocytes Relative: 23 %
Lymphs Abs: 0.8 10*3/uL (ref 0.7–4.0)
MCH: 31.8 pg (ref 26.0–34.0)
MCHC: 34.4 g/dL (ref 30.0–36.0)
MCV: 92.6 fL (ref 80.0–100.0)
Monocytes Absolute: 0.3 10*3/uL (ref 0.1–1.0)
Monocytes Relative: 9 %
Neutro Abs: 2.3 10*3/uL (ref 1.7–7.7)
Neutrophils Relative %: 64 %
Platelet Count: 220 10*3/uL (ref 150–400)
RBC: 3.77 MIL/uL — ABNORMAL LOW (ref 3.87–5.11)
RDW: 13.8 % (ref 11.5–15.5)
WBC Count: 3.6 10*3/uL — ABNORMAL LOW (ref 4.0–10.5)
nRBC: 0 % (ref 0.0–0.2)

## 2020-12-27 LAB — CMP (CANCER CENTER ONLY)
ALT: 26 U/L (ref 0–44)
AST: 23 U/L (ref 15–41)
Albumin: 3.5 g/dL (ref 3.5–5.0)
Alkaline Phosphatase: 62 U/L (ref 38–126)
Anion gap: 8 (ref 5–15)
BUN: 9 mg/dL (ref 6–20)
CO2: 24 mmol/L (ref 22–32)
Calcium: 8.7 mg/dL — ABNORMAL LOW (ref 8.9–10.3)
Chloride: 109 mmol/L (ref 98–111)
Creatinine: 0.76 mg/dL (ref 0.44–1.00)
GFR, Estimated: >60 mL/min (ref >60)
Glucose, Bld: 115 mg/dL — ABNORMAL HIGH (ref 70–99)
Potassium: 4 mmol/L (ref 3.5–5.1)
Sodium: 141 mmol/L (ref 135–145)
Total Bilirubin: 0.3 mg/dL (ref 0.3–1.2)
Total Protein: 6 g/dL — ABNORMAL LOW (ref 6.5–8.1)

## 2020-12-27 MED ORDER — HEPARIN SOD (PORK) LOCK FLUSH 100 UNIT/ML IV SOLN
500.0000 [IU] | Freq: Once | INTRAVENOUS | Status: AC | PRN
  Administered 2020-12-27: 500 [IU]
  Filled 2020-12-27: qty 5

## 2020-12-27 MED ORDER — DIPHENHYDRAMINE HCL 50 MG/ML IJ SOLN
25.0000 mg | Freq: Once | INTRAMUSCULAR | Status: AC
  Administered 2020-12-27: 25 mg via INTRAVENOUS

## 2020-12-27 MED ORDER — AMOXICILLIN-POT CLAVULANATE 875-125 MG PO TABS: 1.0000 | ORAL_TABLET | Freq: Two times a day (BID) | ORAL | 0 refills | Status: DC

## 2020-12-27 MED ORDER — METHYLPREDNISOLONE SODIUM SUCC 125 MG IJ SOLR
60.0000 mg | Freq: Once | INTRAMUSCULAR | Status: AC
  Administered 2020-12-27: 60 mg via INTRAVENOUS

## 2020-12-27 MED ORDER — OFLOXACIN 0.3 % OP SOLN: 1.0000 [drp] | OPHTHALMIC | 0 refills | Status: DC

## 2020-12-27 MED ORDER — FAMOTIDINE 20 MG IN NS 100 ML IVPB
INTRAVENOUS | Status: AC
  Filled 2020-12-27: qty 100

## 2020-12-27 MED ORDER — LORAZEPAM 2 MG/ML IJ SOLN
INTRAMUSCULAR | Status: AC
  Filled 2020-12-27: qty 1

## 2020-12-27 MED ORDER — SODIUM CHLORIDE 0.9 % IV SOLN
Freq: Once | INTRAVENOUS | Status: AC
  Filled 2020-12-27: qty 250

## 2020-12-27 MED ORDER — SODIUM CHLORIDE 0.9% FLUSH
10.0000 mL | INTRAVENOUS | Status: DC | PRN
  Administered 2020-12-27: 10 mL
  Filled 2020-12-27: qty 10

## 2020-12-27 MED ORDER — METHYLPREDNISOLONE SODIUM SUCC 125 MG IJ SOLR
INTRAMUSCULAR | Status: AC
  Filled 2020-12-27: qty 2

## 2020-12-27 MED ORDER — SODIUM CHLORIDE 0.9 % IV SOLN
10.0000 mg | Freq: Once | INTRAVENOUS | Status: AC
  Administered 2020-12-27: 10 mg via INTRAVENOUS
  Filled 2020-12-27: qty 10

## 2020-12-27 MED ORDER — DIPHENHYDRAMINE HCL 50 MG/ML IJ SOLN
INTRAMUSCULAR | Status: AC
  Filled 2020-12-27: qty 1

## 2020-12-27 MED ORDER — LORAZEPAM 2 MG/ML IJ SOLN
1.0000 mg | Freq: Once | INTRAMUSCULAR | Status: AC
  Administered 2020-12-27: 1 mg via INTRAVENOUS

## 2020-12-27 MED ORDER — FAMOTIDINE 20 MG IN NS 100 ML IVPB
20.0000 mg | Freq: Once | INTRAVENOUS | Status: AC
  Administered 2020-12-27: 20 mg via INTRAVENOUS

## 2020-12-27 MED ORDER — PACLITAXEL CHEMO INJECTION 300 MG/50ML
45.0000 mg/m2 | Freq: Once | INTRAVENOUS | Status: AC
  Administered 2020-12-27: 96 mg via INTRAVENOUS
  Filled 2020-12-27: qty 16

## 2020-12-27 NOTE — Patient Instructions (Signed)
[facility]  Discharge Instructions: Thank you for choosing [facility] to provide your oncology and hematology care.   If you have a lab appointment with the Cancer Center, please go directly to the Cancer Center and check in at the registration area.   Wear comfortable clothing and clothing appropriate for easy access to any Portacath or PICC line.   We strive to give you quality time with your provider. You may need to reschedule your appointment if you arrive late (15 or more minutes).  Arriving late affects you and other patients whose appointments are after yours.  Also, if you miss three or more appointments without notifying the office, you may be dismissed from the clinic at the provider's discretion.      For prescription refill requests, have your pharmacy contact our office and allow 72 hours for refills to be completed.    Today you received the following chemotherapy and/or immunotherapy agents taxol      To help prevent nausea and vomiting after your treatment, we encourage you to take your nausea medication as directed.  BELOW ARE SYMPTOMS THAT SHOULD BE REPORTED IMMEDIATELY: *FEVER GREATER THAN 100.4 F (38 C) OR HIGHER *CHILLS OR SWEATING *NAUSEA AND VOMITING THAT IS NOT CONTROLLED WITH YOUR NAUSEA MEDICATION *UNUSUAL SHORTNESS OF BREATH *UNUSUAL BRUISING OR BLEEDING *URINARY PROBLEMS (pain or burning when urinating, or frequent urination) *BOWEL PROBLEMS (unusual diarrhea, constipation, pain near the anus) TENDERNESS IN MOUTH AND THROAT WITH OR WITHOUT PRESENCE OF ULCERS (sore throat, sores in mouth, or a toothache) UNUSUAL RASH, SWELLING OR PAIN  UNUSUAL VAGINAL DISCHARGE OR ITCHING   Items with * indicate a potential emergency and should be followed up as soon as possible or go to the Emergency Department if any problems should occur.  Please show the CHEMOTHERAPY ALERT CARD or IMMUNOTHERAPY ALERT CARD at check-in to the  Emergency Department and triage nurse.  Should you have questions after your visit or need to cancel or reschedule your appointment, please contact [facility]  Dept: [phone]  and follow the prompts.  Office hours are 8:00 a.m. to 4:30 p.m. Monday - Friday. Please note that voicemails left after 4:00 p.m. may not be returned until the following business day.  We are closed weekends and major holidays. You have access to a nurse at all times for urgent questions. Please call the main number to the clinic Dept: [phone] and follow the prompts.   For any non-urgent questions, you may also contact your provider using MyChart. We now offer e-Visits for anyone 18 and older to request care online for non-urgent symptoms. For details visit [email or web address].   Also download the MyChart app! Go to the app store, search "MyChart", open the app, select [facility], and log in with your MyChart username and password.  Due to Covid, a mask is required upon entering the hospital/clinic. If you do not have a mask, one will be given to you upon arrival. For doctor visits, patients may have 1 support person aged 18 or older with them. For treatment visits, patients cannot have anyone with them due to current Covid guidelines and our immunocompromised population.   

## 2020-12-27 NOTE — Patient Instructions (Signed)

## 2020-12-27 NOTE — Progress Notes (Addendum)
[facility] Follow up:    Tracy Pepper, MD [address] [address]   DIAGNOSIS: Cancer Staging Malignant neoplasm of upper-outer quadrant of left breast in female, estrogen receptor positive ([place]) Staging form: Breast, AJCC 8th Edition - Clinical stage from 08/21/2020: Stage IIA (cT1c, cN1, cM0, G3, ER+, PR+, HER2-) - Signed by [name], MD on 08/21/2020   SUMMARY OF ONCOLOGIC HISTORY: Oncology History  Malignant neoplasm of upper-outer quadrant of left breast in female, estrogen receptor positive ([place])  08/21/2020 Initial Diagnosis   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 No pathogenic variants identified on the [facility] Breast Cancer STAT Panel + Multi-Cancer Panel. VUS in [facility] called c.983C>T identified. The report date is 09/04/2020.   The STAT Breast cancer panel offered by [facility] includes sequencing and rearrangement analysis for the following 9 genes:  ATM, BRCA1, BRCA2, CDH1, CHEK2, PALB2, PTEN, STK11 and TP53.    The Multi-Cancer Panel offered by [facility] includes sequencing and/or deletion duplication testing of the following 85 genes: AIP, ALK, APC, ATM, AXIN2,BAP1,  BARD1, BLM, BMPR1A, BRCA1, BRCA2, BRIP1, CASR, CDC73, CDH1, CDK4, CDKN1B, CDKN1C, CDKN2A (p14ARF), CDKN2A (p16INK4a), CEBPA, CHEK2, CTNNA1, DICER1, DIS3L2, EGFR (c.2369C>T, p.Thr790Met  variant only), EPCAM (Deletion/duplication testing only), FH, FLCN, GATA2, GPC3, GREM1 (Promoter region deletion/duplication testing only), HOXB13 (c.251G>A, p.Gly84Glu), HRAS, KIT, MAX, MEN1, MET, MITF (c.952G>A, p.Glu318Lys variant only), MLH1, MSH2, MSH3, MSH6, MUTYH, NBN, NF1, NF2, NTHL1, PALB2, PDGFRA, PHOX2B, PMS2, POLD1, POLE, POT1, PRKAR1A, PTCH1, PTEN, RAD50, RAD51C, RAD51D, RB1, RECQL4, RET, RNF43, RUNX1, SDHAF2, SDHA (sequence changes only), SDHB, SDHC, SDHD, SMAD4, SMARCA4, SMARCB1, SMARCE1, STK11, SUFU, TERC, TERT, TMEM127, TP53, TSC1, TSC2, VHL, WRN and WT1.    09/06/2020 -  Chemotherapy    Patient is on Treatment Plan: BREAST ADJUVANT DOSE DENSE AC Q14D / PACLITAXEL Q7D        CURRENT THERAPY:Weekly Taxol #9  INTERVAL HISTORY: Tracy Dyer 50 y.o. female returns for evaluation prior to receiving her 9th cycle of weekly Taxol.  She is at a dose reduction due to fatigue and mild intermittent peripheral neuroapthy that has improved, and has remained grade 1.   t A few days ago [name] developed a "stye" at the base of her right eyelid.  She notes that she had 3 pustular lesions right next to one another and hey are better, but still present.  She denies any drainage from the eye, or any vision changes.  She denies any fever or chills.  She has been more achy after the past couple chemotherapy treatments.  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   Patient Active Problem List   Diagnosis Date Noted  . Port-A-Cath in place 09/20/2020  . Genetic testing 09/04/2020  . Family history of breast cancer   . Family history of ovarian cancer   . Family history of lung cancer   . Malignant neoplasm of  upper-outer quadrant of left breast in female, estrogen receptor positive ([place]) 08/21/2020  . Anxiety 11/16/2013  . Hypercoagulable state ([place]) 11/16/2013  . TIA (transient ischemic attack) 11/15/2013  . Chest discomfort 01/20/2012  . Factor V Leiden ([place]) 01/20/2012  . MVP  (mitral valve prolapse) 01/20/2012  . Acute bronchitis 12/23/2011    is allergic to sulfa antibiotics, hydromorphone, oxycodone, propoxyphene, and sulfamethoxazole.  MEDICAL HISTORY: Past Medical History:  Diagnosis Date  . Anxiety   . Cancer [facility])    breast  . Family history of breast cancer   . Family history of lung cancer   . Family history of ovarian cancer   . GERD (gastroesophageal reflux disease)    diet controlled  . Seasonal allergies     SURGICAL HISTORY: Past Surgical History:  Procedure Laterality Date  . BREAST BIOPSY Left 2019   x 2 biopsy  . BREAST EXCISIONAL BIOPSY Left   . CHOLECYSTECTOMY  2008  . PORTACATH PLACEMENT N/A 09/05/2020   Procedure: INSERTION PORT-A-CATH;  Surgeon: [name], MD;  Location: [facility];  Service: General;  Laterality: N/A;  PEC BLOCK  . WISDOM TOOTH EXTRACTION      SOCIAL HISTORY: Social History   Socioeconomic History  . Marital status: Married    Spouse name: Not on file  . Number of children: Not on file  . Years of education: Not on file  . Highest education level: Not on file  Occupational History  . Not on file  Tobacco Use  . Smoking status: Never Smoker  . Smokeless tobacco: Never Used  Vaping Use  . Vaping Use: Never used  Substance and Sexual Activity  . Alcohol use: Not Currently  . Drug use: Never  . Sexual activity: Yes    Birth control/protection: None  Other Topics Concern  . Not on file  Social History Narrative  . Not on file   Social Determinants of Health   Financial Resource Strain: Not on file  Food Insecurity: No Food Insecurity  . Worried About Charity fundraiser in the Last Year: Never true  . Ran Out of Food in the Last Year: Never true  Transportation Needs: No Transportation Needs  . Lack of Transportation (Medical): No  . Lack of Transportation (Non-Medical): No  Physical Activity: Not on file  Stress: Not on file  Social Connections: Not on file  Intimate Partner  Violence: Not on file    FAMILY HISTORY: Family History  Problem Relation Age of Onset  . Eczema Father   . Allergic rhinitis Brother   . Breast cancer Mother        in 53's  . Cancer Maternal Uncle        unk type  . Lung cancer Maternal Grandfather 43  . Heart Problems Paternal Grandfather   . Melanoma Cousin   . Ovarian cancer Paternal Great-grandmother   . Cancer Paternal Great-grandmother        GYN cancer, possibly ovarian?  . Cancer Maternal Aunt        half aunts/uncles x5- rare cancers    Review of Systems  Constitutional: Positive for fatigue. Negative for appetite change, chills, fever and unexpected weight change.  HENT:   Negative for lump/mass.   Eyes: Positive for eye problems (see Interval history).  Respiratory: Negative for chest tightness, cough and shortness of breath.   Cardiovascular: Negative for chest pain and leg swelling.  Gastrointestinal: Negative for abdominal distention, abdominal pain, constipation, diarrhea, nausea and vomiting.  Endocrine:  Positive for hot flashes.  Genitourinary: Negative for difficulty urinating.   Musculoskeletal: Negative for arthralgias.  Skin: Negative for itching and rash.  Neurological: Negative for dizziness, extremity weakness, headaches, light-headedness and numbness.  Hematological: Negative for adenopathy. Does not bruise/bleed easily.  Psychiatric/Behavioral: Negative for depression. The patient is not nervous/anxious.       PHYSICAL EXAMINATION  ECOG PERFORMANCE STATUS: 2 - Symptomatic, <50% confined to bed  Vitals:   12/27/20 1058  BP: (!) 127/91  Pulse: 62  Resp: 20  Temp: 97.9 F (36.6 C)  SpO2: 99%    Physical Exam Constitutional:      General: She is not in acute distress.    Appearance: Normal appearance. She is not toxic-appearing.  HENT:     Head: Normocephalic and atraumatic.  Eyes:     General: No scleral icterus.    Comments: Right eye with erythema and two pustular lesions at  lower eyelid no scleral injection, no drainage  Cardiovascular:     Rate and Rhythm: Normal rate and regular rhythm.     Pulses: Normal pulses.     Heart sounds: Normal heart sounds.  Pulmonary:     Effort: Pulmonary effort is normal.     Breath sounds: Normal breath sounds.  Abdominal:     General: Abdomen is flat. Bowel sounds are normal. There is no distension.     Palpations: Abdomen is soft.     Tenderness: There is no abdominal tenderness.  Musculoskeletal:        General: No swelling.     Cervical back: Neck supple.  Lymphadenopathy:     Cervical: No cervical adenopathy.  Skin:    General: Skin is warm and dry.     Findings: No rash.  Neurological:     General: No focal deficit present.     Mental Status: She is alert.  Psychiatric:        Mood and Affect: Mood normal.        Behavior: Behavior normal.     LABORATORY DATA:  CBC    Component Value Date/Time   WBC 3.6 (L) 12/27/2020 1044   WBC 3.9 (L) 12/20/2020 0930   RBC 3.77 (L) 12/27/2020 1044   HGB 12.0 12/27/2020 1044   HCT 34.9 (L) 12/27/2020 1044   PLT 220 12/27/2020 1044   MCV 92.6 12/27/2020 1044   MCH 31.8 12/27/2020 1044   MCHC 34.4 12/27/2020 1044   RDW 13.8 12/27/2020 1044   LYMPHSABS 0.8 12/27/2020 1044   MONOABS 0.3 12/27/2020 1044   EOSABS 0.1 12/27/2020 1044   BASOSABS 0.1 12/27/2020 1044    CMP     Component Value Date/Time   NA 141 12/27/2020 1044   K 4.0 12/27/2020 1044   CL 109 12/27/2020 1044   CO2 24 12/27/2020 1044   GLUCOSE 115 (H) 12/27/2020 1044   BUN 9 12/27/2020 1044   CREATININE 0.76 12/27/2020 1044   CALCIUM 8.7 (L) 12/27/2020 1044   PROT 6.0 (L) 12/27/2020 1044   ALBUMIN 3.5 12/27/2020 1044   AST 23 12/27/2020 1044   ALT 26 12/27/2020 1044   ALKPHOS 62 12/27/2020 1044   BILITOT 0.3 12/27/2020 1044   GFRNONAA >60 12/27/2020 1044            ASSESSMENT and PLAN:   Malignant neoplasm of upper-outer quadrant of left breast in female, estrogen receptor  positive ([facility]) Screening mammogram detected punctate calcifications left breast UOQ: Stable, interval development of mass UOQ left breast with distortion 1.8  cm (3 cm from nipple), 3 o'clock position 5 cm from nipple 1 cm mass: Biopsy benign, concordant, fibroadenoma, 2 enlarged lymph nodes: Positive, breast biopsy 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 ------------------------------------------------------------------------------------------------------------------------- Current treatment: Completed 4 cycles ofdose dense Adriamycin Cytoxan, today is cycle 9 Taxol Echocardiogram2/2/22: EF 60-65%  Chemo Toxicities:  Shavelle continues to do well.  Her grade 1 neuropathy is stable.  She is already at a dose reduction of the Taxol and will continue on this dose.    Dr. Lindi Adie came and evaluated her eye with me, so we could be sure proceeding with chemotherapy was the right decision for her.  We sent in Ofloxacin eye gtts and an Augmentin prescription for her to take.  She will start those today, use warm compresses, and knows to call if it worsens, and has an ophthalmologist she can get in with if needed.    Maydell will return next week for her treatment, and in 2 weeks she will see [place] back in clinic prior to her treatment.  She knows to call for any questions that may arise between now and her next appointment.  We are happy to see her sooner if needed.    Wilber Bihari,  NP 12/28/20 8:13 AM Medical Oncology and Hematology [facility] [place], [place] [address] Tel. [phone]    Fax. [phone]  Attending Note  I personally saw and examined [name]. The plan of care was discussed with her. I agree with the physical exam findings and assessment and plan as documented above. I performed the majority of the counseling and assessment and plan regarding this encounter Possible stye: Prescribed oral antibiotic as well as antibiotic eyedrops.  Signed Harriette Ohara, MD

## 2020-12-28 NOTE — Assessment & Plan Note (Signed)
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 ------------------------------------------------------------------------------------------------------------------------- Current treatment: Completed 4 cycles ofdose dense Adriamycin Cytoxan, today is cycle 9 Taxol Echocardiogram2/2/22: EF 60-65%  Chemo Toxicities:  Tracy Dyer continues to do well.  Her grade 1 neuropathy is stable.  She is already at a dose reduction of the Taxol and will continue on this dose.    Dr. Lindi Adie came and evaluated her eye with me, so we could be sure proceeding with chemotherapy was the right decision for her.  We sent in Ofloxacin eye gtts and an Augmentin prescription for her to take.  She will start those today, use warm compresses, and knows to call if it worsens, and has an ophthalmologist she can get in with if needed.    Tracy Dyer will return next week for her treatment, and in 2 weeks she will see [place] back in clinic prior to her treatment.  She knows to call for any questions that may arise between now and her next appointment.  We are happy to see her sooner if needed.

## 2021-01-01 ENCOUNTER — Encounter: Payer: Self-pay | Admitting: Hematology and Oncology

## 2021-01-02 ENCOUNTER — Encounter: Payer: Self-pay | Admitting: Adult Health

## 2021-01-03 ENCOUNTER — Other Ambulatory Visit: Payer: Self-pay | Admitting: Medical

## 2021-01-03 ENCOUNTER — Telehealth: Payer: Self-pay | Admitting: *Deleted

## 2021-01-03 ENCOUNTER — Inpatient Hospital Stay: Payer: BC Managed Care – PPO | Attending: Adult Health

## 2021-01-03 ENCOUNTER — Other Ambulatory Visit: Payer: Self-pay

## 2021-01-03 ENCOUNTER — Inpatient Hospital Stay: Payer: BC Managed Care – PPO

## 2021-01-03 ENCOUNTER — Encounter: Payer: Self-pay | Admitting: Hematology and Oncology

## 2021-01-03 ENCOUNTER — Encounter: Payer: Self-pay | Admitting: Adult Health

## 2021-01-03 DIAGNOSIS — F419 Anxiety disorder, unspecified: Secondary | ICD-10-CM

## 2021-01-03 DIAGNOSIS — Z170 Estrogen receptor positive status [ER+]: Secondary | ICD-10-CM

## 2021-01-03 DIAGNOSIS — C50412 Malignant neoplasm of upper-outer quadrant of left female breast: Secondary | ICD-10-CM | POA: Insufficient documentation

## 2021-01-03 DIAGNOSIS — Z79899 Other long term (current) drug therapy: Secondary | ICD-10-CM | POA: Insufficient documentation

## 2021-01-03 DIAGNOSIS — Z5111 Encounter for antineoplastic chemotherapy: Secondary | ICD-10-CM | POA: Insufficient documentation

## 2021-01-03 DIAGNOSIS — R110 Nausea: Secondary | ICD-10-CM

## 2021-01-03 LAB — CBC WITH DIFFERENTIAL/PLATELET
Abs Immature Granulocytes: 0.02 10*3/uL (ref 0.00–0.07)
Basophils Absolute: 0 10*3/uL (ref 0.0–0.1)
Basophils Relative: 1 %
Eosinophils Absolute: 0.1 10*3/uL (ref 0.0–0.5)
Eosinophils Relative: 2 %
HCT: 36.2 % (ref 36.0–46.0)
Hemoglobin: 12.6 g/dL (ref 12.0–15.0)
Immature Granulocytes: 1 %
Lymphocytes Relative: 22 %
Lymphs Abs: 0.8 10*3/uL (ref 0.7–4.0)
MCH: 32.1 pg (ref 26.0–34.0)
MCHC: 34.8 g/dL (ref 30.0–36.0)
MCV: 92.1 fL (ref 80.0–100.0)
Monocytes Absolute: 0.3 10*3/uL (ref 0.1–1.0)
Monocytes Relative: 8 %
Neutro Abs: 2.4 10*3/uL (ref 1.7–7.7)
Neutrophils Relative %: 66 %
Platelets: 227 10*3/uL (ref 150–400)
RBC: 3.93 MIL/uL (ref 3.87–5.11)
RDW: 13.5 % (ref 11.5–15.5)
WBC: 3.6 10*3/uL — ABNORMAL LOW (ref 4.0–10.5)
nRBC: 0 % (ref 0.0–0.2)

## 2021-01-03 LAB — COMPREHENSIVE METABOLIC PANEL
ALT: 30 U/L (ref 0–44)
AST: 27 U/L (ref 15–41)
Albumin: 4.1 g/dL (ref 3.5–5.0)
Alkaline Phosphatase: 59 U/L (ref 38–126)
Anion gap: 7 (ref 5–15)
BUN: 11 mg/dL (ref 6–20)
CO2: 26 mmol/L (ref 22–32)
Calcium: 9.4 mg/dL (ref 8.9–10.3)
Chloride: 107 mmol/L (ref 98–111)
Creatinine, Ser: 0.81 mg/dL (ref 0.44–1.00)
GFR, Estimated: >60 mL/min (ref >60)
Glucose, Bld: 102 mg/dL — ABNORMAL HIGH (ref 70–99)
Potassium: 3.9 mmol/L (ref 3.5–5.1)
Sodium: 140 mmol/L (ref 135–145)
Total Bilirubin: 0.3 mg/dL (ref 0.3–1.2)
Total Protein: 6.1 g/dL — ABNORMAL LOW (ref 6.5–8.1)

## 2021-01-03 MED ORDER — DIPHENHYDRAMINE HCL 50 MG/ML IJ SOLN
INTRAMUSCULAR | Status: AC
  Filled 2021-01-03: qty 1

## 2021-01-03 MED ORDER — SODIUM CHLORIDE 0.9 % IV SOLN
45.0000 mg/m2 | Freq: Once | INTRAVENOUS | Status: AC
  Administered 2021-01-03: 96 mg via INTRAVENOUS
  Filled 2021-01-03: qty 16

## 2021-01-03 MED ORDER — HEPARIN SOD (PORK) LOCK FLUSH 100 UNIT/ML IV SOLN
500.0000 [IU] | Freq: Once | INTRAVENOUS | Status: AC | PRN
  Administered 2021-01-03: 500 [IU]
  Filled 2021-01-03: qty 5

## 2021-01-03 MED ORDER — METHYLPREDNISOLONE SODIUM SUCC 125 MG IJ SOLR
60.0000 mg | Freq: Once | INTRAMUSCULAR | Status: AC
  Administered 2021-01-03: 60 mg via INTRAVENOUS

## 2021-01-03 MED ORDER — LORAZEPAM 2 MG/ML IJ SOLN
1.0000 mg | Freq: Once | INTRAMUSCULAR | Status: AC
  Administered 2021-01-03: 1 mg via INTRAVENOUS

## 2021-01-03 MED ORDER — FAMOTIDINE 20 MG IN NS 100 ML IVPB
20.0000 mg | Freq: Once | INTRAVENOUS | Status: AC
  Administered 2021-01-03: 20 mg via INTRAVENOUS
  Filled 2021-01-03: qty 20

## 2021-01-03 MED ORDER — SODIUM CHLORIDE 0.9 % IV SOLN
10.0000 mg | Freq: Once | INTRAVENOUS | Status: AC
  Administered 2021-01-03: 10 mg via INTRAVENOUS
  Filled 2021-01-03: qty 10

## 2021-01-03 MED ORDER — SODIUM CHLORIDE 0.9% FLUSH
10.0000 mL | INTRAVENOUS | Status: DC | PRN
  Administered 2021-01-03: 10 mL
  Filled 2021-01-03: qty 10

## 2021-01-03 MED ORDER — SODIUM CHLORIDE 0.9 % IV SOLN
Freq: Once | INTRAVENOUS | Status: AC
  Filled 2021-01-03: qty 250

## 2021-01-03 MED ORDER — FAMOTIDINE IN NACL 20-0.9 MG/50ML-% IV SOLN: 20.0000 mg | Freq: Once | INTRAVENOUS | Status: DC

## 2021-01-03 MED ORDER — METHYLPREDNISOLONE SODIUM SUCC 125 MG IJ SOLR
INTRAMUSCULAR | Status: AC
  Filled 2021-01-03: qty 2

## 2021-01-03 MED ORDER — LORAZEPAM 2 MG/ML IJ SOLN
INTRAMUSCULAR | Status: AC
  Filled 2021-01-03: qty 1

## 2021-01-03 MED ORDER — DIPHENHYDRAMINE HCL 50 MG/ML IJ SOLN
25.0000 mg | Freq: Once | INTRAMUSCULAR | Status: AC
  Administered 2021-01-03: 25 mg via INTRAVENOUS

## 2021-01-03 NOTE — Telephone Encounter (Signed)
Per my chart message : [name], [name] to [name], NP   01/02/21 12:22 PM Hi [name],  I'm stilling having pain in my left armpit over to my rib. It feels swollen and painful? I also have a little weird bump on my left breast that looks like something is working it's way out of my skin. In general my left breast, nipple, rib and side has been painful? Do I need to come in or is next week ok?  Thank You, Tracy Dyer   This RN attempted to call pt for further information- obtained identified VM - message left requesting a return call for further information.  This RN's name given for returning call.  This note will also be shown to MD for review.

## 2021-01-03 NOTE — Patient Instructions (Signed)
[facility] [place]  Discharge Instructions: Thank you for choosing [facility] to provide your oncology and hematology care.   If you have a lab appointment with the [facility], please go directly to the [facility] and check in at the registration area.  Wear comfortable clothing and clothing appropriate for easy access to any Portacath or PICC line.   We strive to give you quality time with your provider. You may need to reschedule your appointment if you arrive late (15 or more minutes).  Arriving late affects you and other patients whose appointments are after yours.  Also, if you miss three or more appointments without notifying the office, you may be dismissed from the clinic at the provider's discretion.      For prescription refill requests, have your pharmacy contact our office and allow 72 hours for refills to be completed.    Today you received the following chemotherapy and/or immunotherapy agents Taxol.    To help prevent nausea and vomiting after your treatment, we encourage you to take your nausea medication as directed.  BELOW ARE SYMPTOMS THAT SHOULD BE REPORTED IMMEDIATELY: . *FEVER GREATER THAN 100.4 F (38 C) OR HIGHER . *CHILLS OR SWEATING . *NAUSEA AND VOMITING THAT IS NOT CONTROLLED WITH YOUR NAUSEA MEDICATION . *UNUSUAL SHORTNESS OF BREATH . *UNUSUAL BRUISING OR BLEEDING . *URINARY PROBLEMS (pain or burning when urinating, or frequent urination) . *BOWEL PROBLEMS (unusual diarrhea, constipation, pain near the anus) . TENDERNESS IN MOUTH AND THROAT WITH OR WITHOUT PRESENCE OF ULCERS (sore throat, sores in mouth, or a toothache) . UNUSUAL RASH, SWELLING OR PAIN  . UNUSUAL VAGINAL DISCHARGE OR ITCHING   Items with * indicate a potential emergency and should be followed up as soon as possible or go to the Emergency Department if any problems should occur.  Please show the CHEMOTHERAPY ALERT CARD or IMMUNOTHERAPY ALERT CARD at  check-in to the Emergency Department and triage nurse. Should you have questions after your visit or need to cancel or reschedule your appointment, please contact [facility]  [phone] and follow the prompts.  Office hours are 8:00 a.m. to 4:30 p.m. Monday - Friday. Please note that voicemails left after 4:00 p.m. may not be returned until the following business day.  We are closed weekends and major holidays. You have access to a nurse at all times for urgent questions. Please call the main number to the clinic [phone] and follow the prompts.  For any non-urgent questions, you may also contact your provider using MyChart. We now offer e-Visits for anyone 80 and older to request care online for non-urgent symptoms. For details visit [email or web address].   Also download the MyChart app! Go to the app store, search "MyChart", open the app, select [facility], and log in with your MyChart username and password.  Due to Covid, a mask is required upon entering the hospital/clinic. If you do not have a mask, one will be given to you upon arrival. For doctor visits, patients may have 1 support person aged 1 or older with them. For treatment visits, patients cannot have anyone with them due to current Covid guidelines and our immunocompromised population.

## 2021-01-03 NOTE — Telephone Encounter (Signed)
Per my chart message : Tracy Dyer, Tracy Dyer to [name], NP   01/02/21 12:22 PM Hi Tracy Dyer,  I'm stilling having pain in my left armpit over to my rib. It feels swollen and painful? I also have a little weird bump on my left breast that looks like something is working it's way out of my skin. In general my left breast, nipple, rib and side has been painful? Do I need to come in or is next week ok?  Thank You, Tracy Dyer   This RN attempted to call pt for further information- obtained identified VM - message left requesting a return call for further information.  This RN's name given for returning call.  This note will also be shown to MD for review.

## 2021-01-04 NOTE — Telephone Encounter (Signed)
For your approval or refusal. Celsey Asselin M Iraida Cragin, RN  

## 2021-01-06 ENCOUNTER — Encounter: Payer: Self-pay | Admitting: Hematology and Oncology

## 2021-01-09 ENCOUNTER — Other Ambulatory Visit: Payer: Self-pay | Admitting: *Deleted

## 2021-01-09 DIAGNOSIS — Z170 Estrogen receptor positive status [ER+]: Secondary | ICD-10-CM

## 2021-01-09 DIAGNOSIS — C50412 Malignant neoplasm of upper-outer quadrant of left female breast: Secondary | ICD-10-CM

## 2021-01-09 NOTE — Assessment & Plan Note (Signed)
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 ------------------------------------------------------------------------------------------------------------------------- Current treatment: Completed 4 cycles ofdose dense Adriamycin Cytoxan, today is cycle 9 Taxol Echocardiogram2/2/22: EF 60-65%  Chemo Toxicities:  Tracy Dyer continues to do well.  Her grade 1 neuropathy is stable.  She is already at a dose reduction of the Taxol and will continue on this dose.

## 2021-01-09 NOTE — Progress Notes (Signed)
Patient Care Team: [name], MD as PCP - General (Family Medicine)  DIAGNOSIS:    ICD-10-CM   1. Malignant neoplasm of upper-outer quadrant of left breast in female, estrogen receptor positive ([place])  C50.412    Z17.0       SUMMARY OF ONCOLOGIC HISTORY: Oncology History  Malignant neoplasm of upper-outer quadrant of left breast in female, estrogen receptor positive ([place])  08/21/2020 Initial Diagnosis   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 No pathogenic variants identified on the [facility] Breast Cancer STAT Panel + Multi-Cancer Panel. [facility] in [place] called c.983C>T identified. The report date is 09/04/2020.   The STAT Breast cancer panel offered by [facility] includes sequencing and rearrangement analysis for the following 9 genes:  ATM, BRCA1, BRCA2, CDH1, CHEK2, PALB2, PTEN, STK11 and TP53.    The Multi-Cancer Panel offered by [facility] includes sequencing and/or deletion duplication testing of the following 85 genes: AIP, ALK, APC, ATM, AXIN2,BAP1,  BARD1, BLM, BMPR1A, BRCA1, BRCA2, BRIP1, CASR, CDC73, CDH1, CDK4, CDKN1B, CDKN1C, CDKN2A (p14ARF), CDKN2A (p16INK4a), CEBPA, CHEK2, CTNNA1, DICER1, DIS3L2, EGFR (c.2369C>T, p.Thr790Met variant only), EPCAM (Deletion/duplication testing only), FH, FLCN, GATA2, GPC3, GREM1 (Promoter region deletion/duplication testing only), HOXB13 (c.251G>A, p.Gly84Glu), HRAS, KIT,  MAX, MEN1, MET, MITF (c.952G>A, p.Glu318Lys variant only), MLH1, MSH2, MSH3, MSH6, MUTYH, NBN, NF1, NF2, NTHL1, PALB2, PDGFRA, PHOX2B, PMS2, POLD1, POLE, POT1, PRKAR1A, PTCH1, PTEN, RAD50, RAD51C, RAD51D, RB1, RECQL4, RET, RNF43, RUNX1, SDHAF2, SDHA (sequence changes only), SDHB, SDHC, SDHD, SMAD4, SMARCA4, SMARCB1, SMARCE1, STK11, SUFU, TERC, TERT, TMEM127, TP53, TSC1, TSC2, VHL, WRN and WT1.    09/06/2020 -  Chemotherapy    Patient is on Treatment Plan: BREAST ADJUVANT DOSE DENSE AC Q14D / PACLITAXEL Q7D         CHIEF COMPLIANT: Cycle 11 Taxol   INTERVAL HISTORY: Tracy Dyer is a 50 y.o. with above-mentioned history of left breast cancer currently on neoadjuvant chemotherapy with weekly Taxol after completing four cycles of dose dense Adriamycin and Cytoxan. She presents to the clinic today for a toxicity check and cycle 11.  She has started to get more peripheral neuropathy since her last treatment.  Otherwise she is tolerating the treatment fairly well.  Does not have any nausea or vomiting.  She is also complaining of pain in the muscle of the lateral chest wall especially on the left side.  ALLERGIES:  is allergic to sulfa antibiotics, hydromorphone, oxycodone, propoxyphene, and sulfamethoxazole.  MEDICATIONS:  Current Outpatient Medications  Medication Sig Dispense Refill   albuterol (VENTOLIN HFA) 108 (90 Base) MCG/ACT inhaler Inhale 1-2 puffs into the lungs every 4 (four) hours as needed for wheezing or shortness of breath. 8 g 2   clonazePAM (KLONOPIN) 1 MG tablet TAKE 1 TABLET BY MOUTH 2 TIMES DAILY AS NEEDED FOR ANXIETY. 60 tablet 1   fluticasone (FLONASE) 50 MCG/ACT nasal spray Place 1 spray into both nostrils daily as needed for allergies.  l-methylfolate-B6-B12 (METANX) 3-35-2 MG TABS tablet Take 1 tablet by mouth daily.     lidocaine-prilocaine (EMLA) cream Apply to affected area once 30 g 3   LORazepam (ATIVAN) 0.5 MG tablet TAKE 1 TABLET BY MOUTH TWICE A DAY FOR  NAUSEA OR ANXIETY. DO NOT TAKE AT THE SAME TIME AS CLONAZEPAM 40 tablet 0   MAGNESIUM CITRATE PO Take 405 mg by mouth daily. 135 mg     Multiple Vitamins-Minerals (MULTIVITAMIN WITH MINERALS) tablet Take 1 tablet by mouth daily.     ofloxacin (OCUFLOX) 0.3 % ophthalmic solution Place 1 drop into the right eye every 4 (four) hours. 5 mL 0   omeprazole (PRILOSEC) 20 MG capsule Take 1 capsule (20 mg total) by mouth daily. 30 capsule 3   sertraline (ZOLOFT) 100 MG tablet Take 100 mg by mouth at bedtime.     traMADol (ULTRAM) 50 MG tablet Take 1 tablet (50 mg total) by mouth every 6 (six) hours as needed. 10 tablet 0   No current facility-administered medications for this visit.    PHYSICAL EXAMINATION: ECOG PERFORMANCE STATUS: 2 - Symptomatic, <50% confined to bed  Vitals:   01/10/21 1005  BP: (!) 105/53  Pulse: (!) 57  Resp: 17  Temp: 97.7 F (36.5 C)  SpO2: 100%   Filed Weights   01/10/21 1005  Weight: 204 lb 9.6 oz (92.8 kg)    LABORATORY DATA:  I have reviewed the data as listed CMP Latest Ref Rng & Units 01/03/2021 12/27/2020 12/20/2020  Glucose 70 - 99 mg/dL 102(H) 115(H) 93  BUN 6 - 20 mg/dL _0 Creatinine 0.44 - 1.00 mg/dL 0.81 0.76 0.87  Sodium 135 - 145 mmol/L 140 141 136  Potassium 3.5 - 5.1 mmol/L 3.9 4.0 3.9  Chloride 98 - 111 mmol/L 107 109 104  CO2 22 - 32 mmol/L _1 Calcium 8.9 - 10.3 mg/dL 9.4 8.7(L) 8.7(L)  Total Protein 6.5 - 8.1 g/dL 6.1(L) 6.0(L) 6.7  Total Bilirubin 0.3 - 1.2 mg/dL 0.3 0.3 0.3  Alkaline Phos 38 - 126 U/L 59 62 65  AST 15 - 41 U/L 27 23 44(H)  ALT 0 - 44 U/L 30 26 47(H)    Lab Results  Component Value Date   WBC 2.7 (L) 01/10/2021   HGB 12.7 01/10/2021   HCT 35.7 (L) 01/10/2021   MCV 90.8 01/10/2021   PLT 217 01/10/2021   NEUTROABS 1.5 (L) 01/10/2021    ASSESSMENT & PLAN:  Malignant neoplasm of upper-outer quadrant of left breast in female, estrogen receptor positive ([place]) Screening mammogram detected punctate  calcifications left breast UOQ: Stable, interval development of mass UOQ left breast with distortion 1.8 cm (3 cm from nipple), 3 o'clock position 5 cm from nipple 1 cm mass: Biopsy benign, concordant, fibroadenoma, 2 enlarged lymph nodes: Positive, breast biopsy revealed 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  Neoadjuvant chemotherapy with dose dense Adriamycin and Cytoxan followed by Taxol weekly x12 2. breast conserving surgery with sentinel lymph node and targeted node dissection, at a later point she will undergo mastectomy with reconstruction with D IEP flap 3.  Adjuvant radiation therapy 4.  Followed by adjuvant antiestrogen therapy. ------------------------------------------------------------------------------------------------------------------------- Current treatment: Completed 4 cycles of  dose dense Adriamycin Cytoxan, today is cycle 9 Taxol Echocardiogram 09/04/20 : EF 60-65%   Chemo Toxicities:  1.  Fatigue 2. chemo induced peripheral neuropathy: I discussed with her about participating in the neuropathy clinical trial once chemo is done.  We will look at eligibility.  Shakeela continues to do well.  Her grade 1 neuropathy is stable.  She is already at a dose reduction of the Taxol and will continue on this dose.   Return to clinic next week for her last chemo. We will try to obtain a breast MRI and follow-up with Dr. Donne Hazel.   No orders of the defined types were placed in this encounter.  The patient has a good understanding of the overall plan. she agrees with it. she will call with any problems that may develop before the next visit here.  Total time spent: 30 mins including face to face time and time spent for  planning, charting and coordination of care  Rulon Eisenmenger, MD, MPH 01/10/2021  I, Molly Dorshimer, am acting as scribe for Dr. [name].  I have reviewed the above documentation for accuracy and completeness, and I agree with the above.

## 2021-01-10 ENCOUNTER — Inpatient Hospital Stay: Payer: BC Managed Care – PPO | Admitting: Hematology and Oncology

## 2021-01-10 ENCOUNTER — Other Ambulatory Visit: Payer: Self-pay

## 2021-01-10 ENCOUNTER — Inpatient Hospital Stay: Payer: BC Managed Care – PPO

## 2021-01-10 ENCOUNTER — Encounter: Payer: Self-pay | Admitting: *Deleted

## 2021-01-10 DIAGNOSIS — C50412 Malignant neoplasm of upper-outer quadrant of left female breast: Secondary | ICD-10-CM

## 2021-01-10 DIAGNOSIS — Z170 Estrogen receptor positive status [ER+]: Secondary | ICD-10-CM | POA: Diagnosis not present

## 2021-01-10 DIAGNOSIS — Z95828 Presence of other vascular implants and grafts: Secondary | ICD-10-CM

## 2021-01-10 DIAGNOSIS — Z79899 Other long term (current) drug therapy: Secondary | ICD-10-CM | POA: Diagnosis not present

## 2021-01-10 DIAGNOSIS — Z5111 Encounter for antineoplastic chemotherapy: Secondary | ICD-10-CM | POA: Diagnosis not present

## 2021-01-10 LAB — CBC WITH DIFFERENTIAL/PLATELET
Abs Immature Granulocytes: 0.01 10*3/uL (ref 0.00–0.07)
Basophils Absolute: 0 10*3/uL (ref 0.0–0.1)
Basophils Relative: 1 %
Eosinophils Absolute: 0.1 10*3/uL (ref 0.0–0.5)
Eosinophils Relative: 3 %
HCT: 35.7 % — ABNORMAL LOW (ref 36.0–46.0)
Hemoglobin: 12.7 g/dL (ref 12.0–15.0)
Immature Granulocytes: 0 %
Lymphocytes Relative: 34 %
Lymphs Abs: 0.9 10*3/uL (ref 0.7–4.0)
MCH: 32.3 pg (ref 26.0–34.0)
MCHC: 35.6 g/dL (ref 30.0–36.0)
MCV: 90.8 fL (ref 80.0–100.0)
Monocytes Absolute: 0.2 10*3/uL (ref 0.1–1.0)
Monocytes Relative: 8 %
Neutro Abs: 1.5 10*3/uL — ABNORMAL LOW (ref 1.7–7.7)
Neutrophils Relative %: 54 %
Platelets: 217 10*3/uL (ref 150–400)
RBC: 3.93 MIL/uL (ref 3.87–5.11)
RDW: 13.5 % (ref 11.5–15.5)
WBC: 2.7 10*3/uL — ABNORMAL LOW (ref 4.0–10.5)
nRBC: 0 % (ref 0.0–0.2)

## 2021-01-10 LAB — COMPREHENSIVE METABOLIC PANEL
ALT: 31 U/L (ref 0–44)
AST: 26 U/L (ref 15–41)
Albumin: 3.6 g/dL (ref 3.5–5.0)
Alkaline Phosphatase: 60 U/L (ref 38–126)
Anion gap: 7 (ref 5–15)
BUN: 9 mg/dL (ref 6–20)
CO2: 24 mmol/L (ref 22–32)
Calcium: 9.1 mg/dL (ref 8.9–10.3)
Chloride: 108 mmol/L (ref 98–111)
Creatinine, Ser: 0.75 mg/dL (ref 0.44–1.00)
GFR, Estimated: >60 mL/min (ref >60)
Glucose, Bld: 108 mg/dL — ABNORMAL HIGH (ref 70–99)
Potassium: 3.7 mmol/L (ref 3.5–5.1)
Sodium: 139 mmol/L (ref 135–145)
Total Bilirubin: 0.3 mg/dL (ref 0.3–1.2)
Total Protein: 6.2 g/dL — ABNORMAL LOW (ref 6.5–8.1)

## 2021-01-10 MED ORDER — METHYLPREDNISOLONE SODIUM SUCC 125 MG IJ SOLR
60.0000 mg | Freq: Once | INTRAMUSCULAR | Status: AC
  Administered 2021-01-10: 60 mg via INTRAVENOUS

## 2021-01-10 MED ORDER — LORAZEPAM 2 MG/ML IJ SOLN
1.0000 mg | Freq: Once | INTRAMUSCULAR | Status: AC
  Administered 2021-01-10: 1 mg via INTRAVENOUS

## 2021-01-10 MED ORDER — SODIUM CHLORIDE 0.9 % IV SOLN
10.0000 mg | Freq: Once | INTRAVENOUS | Status: AC
  Administered 2021-01-10: 10 mg via INTRAVENOUS
  Filled 2021-01-10: qty 10

## 2021-01-10 MED ORDER — FAMOTIDINE 20 MG IN NS 100 ML IVPB
INTRAVENOUS | Status: AC
  Filled 2021-01-10: qty 100

## 2021-01-10 MED ORDER — SODIUM CHLORIDE 0.9 % IV SOLN
Freq: Once | INTRAVENOUS | Status: AC
Start: 2021-01-10 — End: 2021-01-10
  Filled 2021-01-10: qty 250

## 2021-01-10 MED ORDER — SODIUM CHLORIDE 0.9 % IV SOLN
Freq: Once | INTRAVENOUS | Status: AC
  Filled 2021-01-10: qty 250

## 2021-01-10 MED ORDER — FAMOTIDINE 20 MG IN NS 100 ML IVPB
20.0000 mg | Freq: Once | INTRAVENOUS | Status: AC
  Administered 2021-01-10: 20 mg via INTRAVENOUS

## 2021-01-10 MED ORDER — LORAZEPAM 2 MG/ML IJ SOLN
INTRAMUSCULAR | Status: AC
  Filled 2021-01-10: qty 1

## 2021-01-10 MED ORDER — SODIUM CHLORIDE 0.9 % IV SOLN
45.0000 mg/m2 | Freq: Once | INTRAVENOUS | Status: AC
  Administered 2021-01-10: 96 mg via INTRAVENOUS
  Filled 2021-01-10: qty 16

## 2021-01-10 MED ORDER — DIPHENHYDRAMINE HCL 50 MG/ML IJ SOLN
25.0000 mg | Freq: Once | INTRAMUSCULAR | Status: AC
  Administered 2021-01-10: 25 mg via INTRAVENOUS

## 2021-01-10 MED ORDER — METHYLPREDNISOLONE SODIUM SUCC 125 MG IJ SOLR
INTRAMUSCULAR | Status: AC
  Filled 2021-01-10: qty 2

## 2021-01-10 MED ORDER — SODIUM CHLORIDE 0.9% FLUSH
10.0000 mL | Freq: Once | INTRAVENOUS | Status: AC
Start: 2021-01-10 — End: 2021-01-10
  Administered 2021-01-10: 10 mL
  Filled 2021-01-10: qty 10

## 2021-01-10 MED ORDER — HEPARIN SOD (PORK) LOCK FLUSH 100 UNIT/ML IV SOLN
500.0000 [IU] | Freq: Once | INTRAVENOUS | Status: AC | PRN
  Administered 2021-01-10: 500 [IU]
  Filled 2021-01-10: qty 5

## 2021-01-10 MED ORDER — SODIUM CHLORIDE 0.9% FLUSH
10.0000 mL | INTRAVENOUS | Status: DC | PRN
Start: 2021-01-10 — End: 2021-01-10
  Administered 2021-01-10: 10 mL
  Filled 2021-01-10: qty 10

## 2021-01-10 MED ORDER — DIPHENHYDRAMINE HCL 50 MG/ML IJ SOLN
INTRAMUSCULAR | Status: AC
  Filled 2021-01-10: qty 1

## 2021-01-10 NOTE — Patient Instructions (Signed)
[facility] MEDICAL [facility]COLOGY   Discharge Instructions: Thank you for choosing [facility] to provide your oncology and hematology care.   If you have a lab appointment with the Cancer Center, please go directly to the Cancer Center and check in at the registration area.   Wear comfortable clothing and clothing appropriate for easy access to any Portacath or PICC line.   We strive to give you quality time with your provider. You may need to reschedule your appointment if you arrive late (15 or more minutes).  Arriving late affects you and other patients whose appointments are after yours.  Also, if you miss three or more appointments without notifying the office, you may be dismissed from the clinic at the provider's discretion.      For prescription refill requests, have your pharmacy contact our office and allow 72 hours for refills to be completed.    Today you received the following chemotherapy and/or immunotherapy agents: paclitaxel.      To help prevent nausea and vomiting after your treatment, we encourage you to take your nausea medication as directed.  BELOW ARE SYMPTOMS THAT SHOULD BE REPORTED IMMEDIATELY: *FEVER GREATER THAN 100.4 F (38 C) OR HIGHER *CHILLS OR SWEATING *NAUSEA AND VOMITING THAT IS NOT CONTROLLED WITH YOUR NAUSEA MEDICATION *UNUSUAL SHORTNESS OF BREATH *UNUSUAL BRUISING OR BLEEDING *URINARY PROBLEMS (pain or burning when urinating, or frequent urination) *BOWEL PROBLEMS (unusual diarrhea, constipation, pain near the anus) TENDERNESS IN MOUTH AND THROAT WITH OR WITHOUT PRESENCE OF ULCERS (sore throat, sores in mouth, or a toothache) UNUSUAL RASH, SWELLING OR PAIN  UNUSUAL VAGINAL DISCHARGE OR ITCHING   Items with * indicate a potential emergency and should be followed up as soon as possible or go to the Emergency Department if any problems should occur.  Please show the CHEMOTHERAPY ALERT CARD or IMMUNOTHERAPY ALERT CARD at check-in  to the Emergency Department and triage nurse.  Should you have questions after your visit or need to cancel or reschedule your appointment, please contact [facility]  Dept: [phone]  and follow the prompts.  Office hours are 8:00 a.m. to 4:30 p.m. Monday - Friday. Please note that voicemails left after 4:00 p.m. may not be returned until the following business day.  We are closed weekends and major holidays. You have access to a nurse at all times for urgent questions. Please call the main number to the clinic Dept: [phone] and follow the prompts.   For any non-urgent questions, you may also contact your provider using MyChart. We now offer e-Visits for anyone 18 and older to request care online for non-urgent symptoms. For details visit [email or web address].   Also download the MyChart app! Go to the app store, search "MyChart", open the app, select [facility], and log in with your MyChart username and password.  Due to Covid, a mask is required upon entering the hospital/clinic. If you do not have a mask, one will be given to you upon arrival. For doctor visits, patients may have 1 support person aged 18 or older with them. For treatment visits, patients cannot have anyone with them due to current Covid guidelines and our immunocompromised population.   

## 2021-01-13 ENCOUNTER — Encounter: Payer: Self-pay | Admitting: Adult Health

## 2021-01-15 DIAGNOSIS — D0512 Intraductal carcinoma in situ of left breast: Secondary | ICD-10-CM | POA: Diagnosis not present

## 2021-01-15 DIAGNOSIS — Z421 Encounter for breast reconstruction following mastectomy: Secondary | ICD-10-CM | POA: Diagnosis not present

## 2021-01-15 DIAGNOSIS — Z170 Estrogen receptor positive status [ER+]: Secondary | ICD-10-CM | POA: Diagnosis not present

## 2021-01-16 NOTE — Progress Notes (Signed)
Patient Care Team: [name], MD as PCP - General (Family Medicine)  DIAGNOSIS:    ICD-10-CM   1. Malignant neoplasm of upper-outer quadrant of left breast in female, estrogen receptor positive ([place])  C50.412    Z17.0       SUMMARY OF ONCOLOGIC HISTORY: Oncology History  Malignant neoplasm of upper-outer quadrant of left breast in female, estrogen receptor positive ([place])  08/21/2020 Initial Diagnosis   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 No pathogenic variants identified on the [facility] Breast Cancer STAT Panel + Multi-Cancer Panel. [facility] in [facility] called c.983C>T identified. The report date is 09/04/2020.   The STAT Breast cancer panel offered by [facility] includes sequencing and rearrangement analysis for the following 9 genes:  ATM, BRCA1, BRCA2, CDH1, CHEK2, PALB2, PTEN, STK11 and TP53.    The Multi-Cancer Panel offered by [facility] includes sequencing and/or deletion duplication testing of the following 85 genes: AIP, ALK, APC, ATM, AXIN2,BAP1,  BARD1, BLM, BMPR1A, BRCA1, BRCA2, BRIP1, CASR, CDC73, CDH1, CDK4, CDKN1B, CDKN1C, CDKN2A (p14ARF), CDKN2A (p16INK4a), CEBPA, CHEK2, CTNNA1, DICER1, DIS3L2, EGFR (c.2369C>T, p.Thr790Met variant only), EPCAM (Deletion/duplication testing only), FH, FLCN, GATA2, GPC3, GREM1 (Promoter region deletion/duplication testing only), HOXB13 (c.251G>A, p.Gly84Glu), HRAS, KIT,  MAX, MEN1, MET, MITF (c.952G>A, p.Glu318Lys variant only), MLH1, MSH2, MSH3, MSH6, MUTYH, NBN, NF1, NF2, NTHL1, PALB2, PDGFRA, PHOX2B, PMS2, POLD1, POLE, POT1, PRKAR1A, PTCH1, PTEN, RAD50, RAD51C, RAD51D, RB1, RECQL4, RET, RNF43, RUNX1, SDHAF2, SDHA (sequence changes only), SDHB, SDHC, SDHD, SMAD4, SMARCA4, SMARCB1, SMARCE1, STK11, SUFU, TERC, TERT, TMEM127, TP53, TSC1, TSC2, VHL, WRN and WT1.    09/06/2020 -  Chemotherapy    Patient is on Treatment Plan: BREAST ADJUVANT DOSE DENSE AC Q14D / PACLITAXEL Q7D         CHIEF COMPLIANT: Cycle 12 Taxol  INTERVAL HISTORY: Tracy Dyer is a 50 y.o. with above-mentioned history of left breast cancer currently on neoadjuvant chemotherapy with weekly Taxol after completing four cycles of dose dense Adriamycin and Cytoxan. She presents to the clinic today for a toxicity check and cycle 12.   ALLERGIES:  is allergic to sulfa antibiotics, hydromorphone, oxycodone, propoxyphene, and sulfamethoxazole.  MEDICATIONS:  Current Outpatient Medications  Medication Sig Dispense Refill   albuterol (VENTOLIN HFA) 108 (90 Base) MCG/ACT inhaler Inhale 1-2 puffs into the lungs every 4 (four) hours as needed for wheezing or shortness of breath. 8 g 2   clonazePAM (KLONOPIN) 1 MG tablet TAKE 1 TABLET BY MOUTH 2 TIMES DAILY AS NEEDED FOR ANXIETY. 60 tablet 1   fluticasone (FLONASE) 50 MCG/ACT nasal spray Place 1 spray into both nostrils daily as needed for allergies.     l-methylfolate-B6-B12 (METANX) 3-35-2 MG TABS tablet Take 1 tablet by mouth daily.     lidocaine-prilocaine (EMLA) cream Apply to affected area once 30 g 3   LORazepam (ATIVAN) 0.5 MG tablet TAKE 1 TABLET BY MOUTH TWICE A DAY FOR NAUSEA OR ANXIETY. DO  NOT TAKE AT THE SAME TIME AS CLONAZEPAM 40 tablet 0   MAGNESIUM CITRATE PO Take 405 mg by mouth daily. 135 mg     Multiple Vitamins-Minerals (MULTIVITAMIN WITH MINERALS) tablet Take 1 tablet by mouth daily.     ofloxacin (OCUFLOX) 0.3 % ophthalmic  solution Place 1 drop into the right eye every 4 (four) hours. 5 mL 0   omeprazole (PRILOSEC) 20 MG capsule Take 1 capsule (20 mg total) by mouth daily. 30 capsule 3   sertraline (ZOLOFT) 100 MG tablet Take 100 mg by mouth at bedtime.     traMADol (ULTRAM) 50 MG tablet Take 1 tablet (50 mg total) by mouth every 6 (six) hours as needed. 10 tablet 0   No current facility-administered medications for this visit.    PHYSICAL EXAMINATION: ECOG PERFORMANCE STATUS: 2 - Symptomatic, <50% confined to bed  Vitals:   01/17/21 1024  BP: 126/82  Pulse: 79  Resp: 19  Temp: 97.6 F (36.4 C)  SpO2: 99%   Filed Weights   01/17/21 1024  Weight: (!) 394 lb (178.7 kg)     LABORATORY DATA:  I have reviewed the data as listed CMP Latest Ref Rng & Units 01/10/2021 01/03/2021 12/27/2020  Glucose 70 - 99 mg/dL 108(H) 102(H) 115(H)  BUN 6 - 20 mg/dL _0 Creatinine 0.44 - 1.00 mg/dL 0.75 0.81 0.76  Sodium 135 - 145 mmol/L 139 140 141  Potassium 3.5 - 5.1 mmol/L 3.7 3.9 4.0  Chloride 98 - 111 mmol/L 108 107 109  CO2 22 - 32 mmol/L _1 Calcium 8.9 - 10.3 mg/dL 9.1 9.4 8.7(L)  Total Protein 6.5 - 8.1 g/dL 6.2(L) 6.1(L) 6.0(L)  Total Bilirubin 0.3 - 1.2 mg/dL 0.3 0.3 0.3  Alkaline Phos 38 - 126 U/L 60 59 62  AST 15 - 41 U/L _2 ALT 0 - 44 U/L _3 Lab Results  Component Value Date   WBC 3.2 (L) 01/17/2021   HGB 12.4 01/17/2021   HCT 35.8 (L) 01/17/2021   MCV 92.0 01/17/2021   PLT 214 01/17/2021   NEUTROABS 1.8 01/17/2021    ASSESSMENT & PLAN:  Malignant neoplasm of upper-outer quadrant of left breast in female, estrogen receptor positive ([place]) 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%, PR  95%, HER2 equivocal, FISH pending, Ki-67 20% T1CN1 stage IIa MammaPrint: High risk: Luminal type B, probability of path CR 6% with chemo and antiestrogen therapy predicted benefit of treatment at 5 years 94.6%, average 10-year risk of recurrence untreated: 29%   Treatment plan: 1.  Neoadjuvant chemotherapy with dose dense Adriamycin and Cytoxan followed by Taxol weekly x12 2. breast conserving surgery with sentinel lymph node and targeted node dissection, at a later point she will undergo mastectomy with reconstruction with D IEP flap 3.  Adjuvant radiation therapy 4.  Followed by adjuvant antiestrogen therapy. ------------------------------------------------------------------------------------------------------------------------- Current treatment: Completed 4 cycles of dose dense Adriamycin Cytoxan, today is cycle 12 Taxol Echocardiogram 09/04/20 : EF 60-65%   Chemo Toxicities:  1.  Fatigue 2. chemo induced peripheral neuropathy: I discussed with her about participating in the neuropathy clinical trial once chemo is done.  We will look at eligibility.  We will try to obtain a breast MRI and follow-up with Dr. Donne Hazel.   Return to clinic after surgery next week   No orders of the defined types were placed in this encounter.  The patient has a good understanding of the overall plan. she agrees with it. she will call with any problems that may develop before the next visit here.  Total time spent: 30 mins including face to face time and time spent for planning, charting and coordination of care  Rulon Eisenmenger, MD, MPH 01/17/2021  I, Thana Ates, am acting as scribe for Dr. [name].  I have reviewed the above documentation for accuracy and completeness, and I agree with the above.

## 2021-01-16 NOTE — Assessment & Plan Note (Signed)
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%, PR 95%, HER2 equivocal, FISH pending, Ki-67 20% T1CN1 stage IIa MammaPrint: High risk: Luminal type B, probability of path CR 6% with chemo and antiestrogen therapy predicted benefit of treatment at 5 years 94.6%, average 10-year risk of recurrence untreated: 29%  Treatment plan: 1.Neoadjuvant chemotherapy with dose dense Adriamycin and Cytoxan followed by Taxol weekly x12 2.breast conserving surgery with sentinel lymph node and targeted node dissection, at a later point she will undergo mastectomy with reconstruction with D IEP flap 3.Adjuvant radiation therapy 4.Followed by adjuvant antiestrogen therapy. ------------------------------------------------------------------------------------------------------------------------- Current treatment: Completed 4 cycles ofdose dense Adriamycin Cytoxan, today is cycle12Taxol Echocardiogram2/2/22: EF 60-65%  Chemo Toxicities: 1.  Fatigue 2. chemo induced peripheral neuropathy: I discussed with her about participating in the neuropathy clinical trial once chemo is done.  We will look at eligibility.  Tracy Dyer continues to do well. Her grade 1 neuropathy is stable. She is already at a dose reduction of the Taxol and will continue on this dose.   We will try to obtain a breast MRI and follow-up with Dr. Donne Hazel.

## 2021-01-17 ENCOUNTER — Inpatient Hospital Stay: Payer: BC Managed Care – PPO

## 2021-01-17 ENCOUNTER — Other Ambulatory Visit: Payer: Self-pay

## 2021-01-17 ENCOUNTER — Encounter: Payer: Self-pay | Admitting: *Deleted

## 2021-01-17 ENCOUNTER — Encounter: Payer: Self-pay | Admitting: General Practice

## 2021-01-17 ENCOUNTER — Inpatient Hospital Stay (HOSPITAL_BASED_OUTPATIENT_CLINIC_OR_DEPARTMENT_OTHER): Payer: BC Managed Care – PPO | Admitting: Hematology and Oncology

## 2021-01-17 VITALS — Wt 203.3 lb

## 2021-01-17 DIAGNOSIS — C50412 Malignant neoplasm of upper-outer quadrant of left female breast: Secondary | ICD-10-CM

## 2021-01-17 DIAGNOSIS — Z170 Estrogen receptor positive status [ER+]: Secondary | ICD-10-CM

## 2021-01-17 DIAGNOSIS — Z79899 Other long term (current) drug therapy: Secondary | ICD-10-CM | POA: Diagnosis not present

## 2021-01-17 DIAGNOSIS — Z5111 Encounter for antineoplastic chemotherapy: Secondary | ICD-10-CM | POA: Diagnosis not present

## 2021-01-17 LAB — CBC WITH DIFFERENTIAL/PLATELET
Abs Immature Granulocytes: 0.02 10*3/uL (ref 0.00–0.07)
Basophils Absolute: 0.1 10*3/uL (ref 0.0–0.1)
Basophils Relative: 2 %
Eosinophils Absolute: 0.1 10*3/uL (ref 0.0–0.5)
Eosinophils Relative: 2 %
HCT: 35.8 % — ABNORMAL LOW (ref 36.0–46.0)
Hemoglobin: 12.4 g/dL (ref 12.0–15.0)
Immature Granulocytes: 1 %
Lymphocytes Relative: 28 %
Lymphs Abs: 0.9 10*3/uL (ref 0.7–4.0)
MCH: 31.9 pg (ref 26.0–34.0)
MCHC: 34.6 g/dL (ref 30.0–36.0)
MCV: 92 fL (ref 80.0–100.0)
Monocytes Absolute: 0.3 10*3/uL (ref 0.1–1.0)
Monocytes Relative: 10 %
Neutro Abs: 1.8 10*3/uL (ref 1.7–7.7)
Neutrophils Relative %: 57 %
Platelets: 214 10*3/uL (ref 150–400)
RBC: 3.89 MIL/uL (ref 3.87–5.11)
RDW: 13.5 % (ref 11.5–15.5)
WBC: 3.2 10*3/uL — ABNORMAL LOW (ref 4.0–10.5)
nRBC: 0 % (ref 0.0–0.2)

## 2021-01-17 LAB — CMP (CANCER CENTER ONLY)
ALT: 31 U/L (ref 0–44)
AST: 25 U/L (ref 15–41)
Albumin: 3.8 g/dL (ref 3.5–5.0)
Alkaline Phosphatase: 60 U/L (ref 38–126)
Anion gap: 6 (ref 5–15)
BUN: 9 mg/dL (ref 6–20)
CO2: 24 mmol/L (ref 22–32)
Calcium: 8.8 mg/dL — ABNORMAL LOW (ref 8.9–10.3)
Chloride: 109 mmol/L (ref 98–111)
Creatinine: 0.82 mg/dL (ref 0.44–1.00)
GFR, Estimated: >60 mL/min (ref >60)
Glucose, Bld: 97 mg/dL (ref 70–99)
Potassium: 3.9 mmol/L (ref 3.5–5.1)
Sodium: 139 mmol/L (ref 135–145)
Total Bilirubin: 0.4 mg/dL (ref 0.3–1.2)
Total Protein: 6.2 g/dL — ABNORMAL LOW (ref 6.5–8.1)

## 2021-01-17 MED ORDER — HEPARIN SOD (PORK) LOCK FLUSH 100 UNIT/ML IV SOLN
500.0000 [IU] | Freq: Once | INTRAVENOUS | Status: AC | PRN
  Administered 2021-01-17: 500 [IU]
  Filled 2021-01-17: qty 5

## 2021-01-17 MED ORDER — SODIUM CHLORIDE 0.9 % IV SOLN
Freq: Once | INTRAVENOUS | Status: AC
  Filled 2021-01-17: qty 250

## 2021-01-17 MED ORDER — METHYLPREDNISOLONE SODIUM SUCC 125 MG IJ SOLR
60.0000 mg | Freq: Once | INTRAMUSCULAR | Status: AC
  Administered 2021-01-17: 60 mg via INTRAVENOUS

## 2021-01-17 MED ORDER — SODIUM CHLORIDE 0.9 % IV SOLN
45.0000 mg/m2 | Freq: Once | INTRAVENOUS | Status: AC
  Administered 2021-01-17: 96 mg via INTRAVENOUS
  Filled 2021-01-17: qty 16

## 2021-01-17 MED ORDER — SODIUM CHLORIDE 0.9% FLUSH
10.0000 mL | INTRAVENOUS | Status: DC | PRN
  Administered 2021-01-17: 10 mL
  Filled 2021-01-17: qty 10

## 2021-01-17 MED ORDER — DIPHENHYDRAMINE HCL 50 MG/ML IJ SOLN
25.0000 mg | Freq: Once | INTRAMUSCULAR | Status: AC
Start: 2021-01-17 — End: 2021-01-17
  Administered 2021-01-17: 25 mg via INTRAVENOUS

## 2021-01-17 MED ORDER — FAMOTIDINE 20 MG IN NS 100 ML IVPB
20.0000 mg | Freq: Once | INTRAVENOUS | Status: AC
  Administered 2021-01-17: 20 mg via INTRAVENOUS

## 2021-01-17 MED ORDER — DIPHENHYDRAMINE HCL 50 MG/ML IJ SOLN
INTRAMUSCULAR | Status: AC
  Filled 2021-01-17: qty 1

## 2021-01-17 MED ORDER — SODIUM CHLORIDE 0.9 % IV SOLN
10.0000 mg | Freq: Once | INTRAVENOUS | Status: AC
  Administered 2021-01-17: 10 mg via INTRAVENOUS
  Filled 2021-01-17: qty 10

## 2021-01-17 MED ORDER — LORAZEPAM 2 MG/ML IJ SOLN
1.0000 mg | Freq: Once | INTRAMUSCULAR | Status: AC
Start: 2021-01-17 — End: 2021-01-17
  Administered 2021-01-17: 1 mg via INTRAVENOUS

## 2021-01-17 MED ORDER — FAMOTIDINE 20 MG IN NS 100 ML IVPB
INTRAVENOUS | Status: AC
  Filled 2021-01-17: qty 100

## 2021-01-17 MED ORDER — LORAZEPAM 2 MG/ML IJ SOLN
INTRAMUSCULAR | Status: AC
  Filled 2021-01-17: qty 1

## 2021-01-17 MED ORDER — METHYLPREDNISOLONE SODIUM SUCC 125 MG IJ SOLR
INTRAMUSCULAR | Status: AC
  Filled 2021-01-17: qty 2

## 2021-01-17 NOTE — Progress Notes (Signed)
[facility] Spiritual Care Note  Referred by Sharyn Dross for emotional support for anxiety related to completing chemo and encountering survivorship adjustment. Visited with Aryaa in infusion, bringing "pick-me-up package," as well as information about [facility] support programming. Highlighted FYNN, Finding Your New Normal, as a key resource for navigating common feelings and changes associated with survivorship. Jeselle was appreciative of visit and reports no other needs at this time; she knows to reach out to chaplain as needed/desired.   [place], [place], [facility] Pager [phone] Voicemail [phone]

## 2021-01-20 ENCOUNTER — Encounter: Payer: Self-pay | Admitting: *Deleted

## 2021-01-20 ENCOUNTER — Encounter: Payer: Self-pay | Admitting: Hematology and Oncology

## 2021-01-20 ENCOUNTER — Other Ambulatory Visit: Payer: Self-pay

## 2021-01-20 ENCOUNTER — Ambulatory Visit
Admission: RE | Admit: 2021-01-20 | Discharge: 2021-01-20 | Disposition: A | Discharge status: Home / Self Care | Payer: BC Managed Care – PPO | Source: Ambulatory Visit | Attending: Hematology and Oncology | Admitting: Hematology and Oncology

## 2021-01-20 DIAGNOSIS — C50412 Malignant neoplasm of upper-outer quadrant of left female breast: Secondary | ICD-10-CM

## 2021-01-20 DIAGNOSIS — Z170 Estrogen receptor positive status [ER+]: Secondary | ICD-10-CM

## 2021-01-20 DIAGNOSIS — C773 Secondary and unspecified malignant neoplasm of axilla and upper limb lymph nodes: Secondary | ICD-10-CM | POA: Diagnosis not present

## 2021-01-20 IMAGING — MR MR BREAST BILAT WO/W CM
13 of 14 series · 28 of 48 positions shown · IV contrast (gadavist)
Comparison: Previous exam(s).

CLINICAL DATA: Biopsy proven:
TECHNIQUE: Multiplanar, multisequence MR images of both breasts were obtained
prior to and following the intravenous administration of 9 ml of
Gadavist

[Series 2: t2_tirm_tra ipat (a-p) · axial · 3.0mm · 0.78mm/px · 1 of 70 slices shown]
[im 1/70]
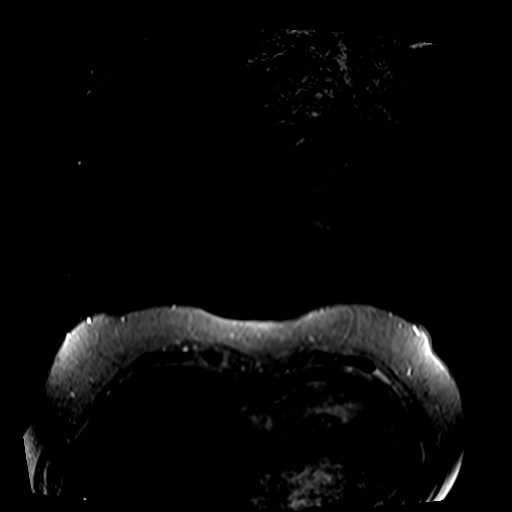

[Series 3: fl3d pre-cm no · axial · non-contrast · 0.9mm · 1.04mm/px · z∈[-68,+147]mm · 2 of 240 slices shown]
[im 1/240]
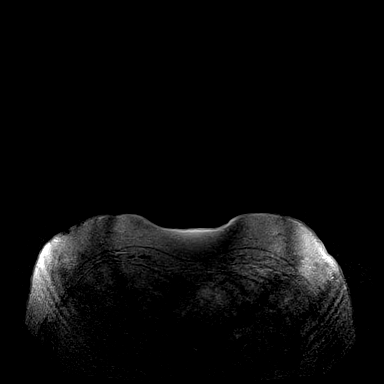
[im 240/240]
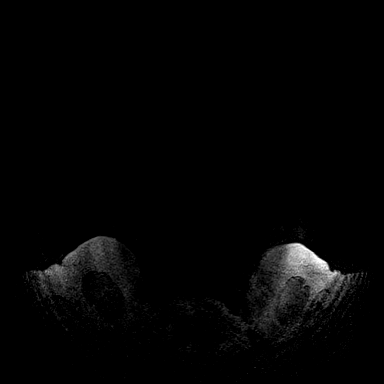

[Series 4: fl3d pre-cm · axial · non-contrast · 0.9mm · 0.96mm/px · z∈[-68,+147]mm · 2 of 240 slices shown]
[im 1/240]
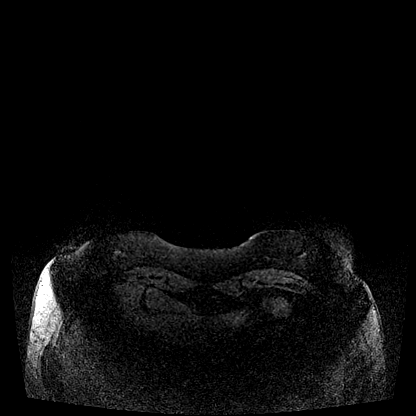
[im 240/240]
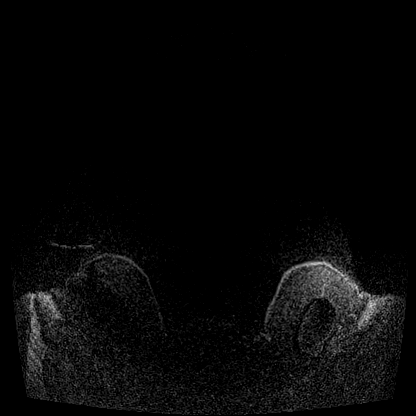

[Series 5: fl3d post-cm 20 · axial · 0.9mm · 0.96mm/px · z∈[-68,+147]mm · 3 of 240 slices shown (1 of 3)]
[im 1/240]
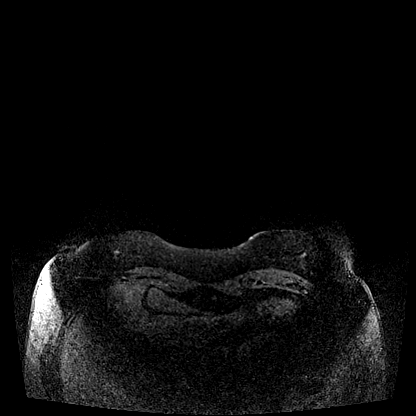
[im 120/240]
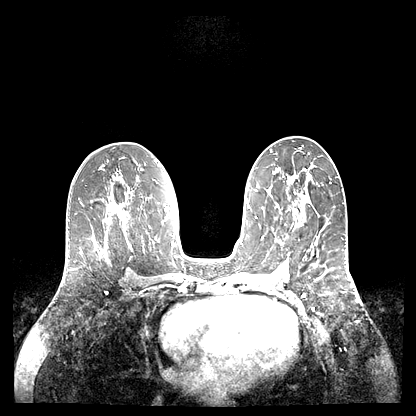
[im 240/240]
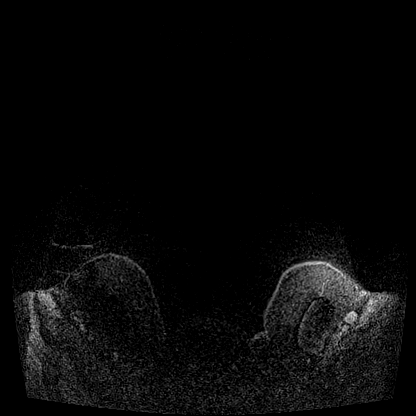

[Series 6: fl3d post-cm 20 · axial · 0.9mm · 0.96mm/px · z∈[-68,+147]mm · 3 of 240 slices shown (2 of 3)]
[im 1/240]
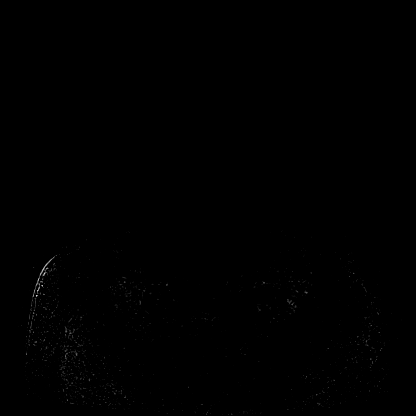
[im 120/240]
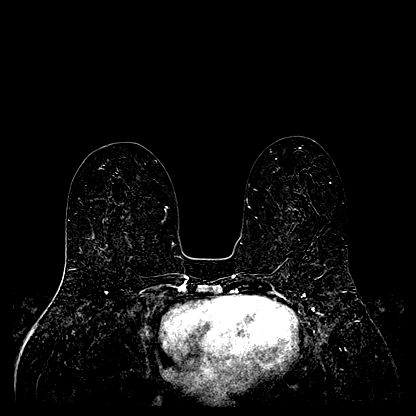
[im 240/240]
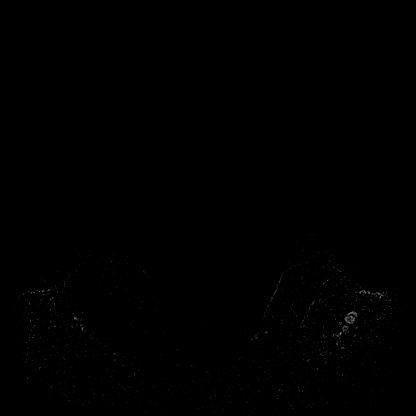

[Series 7: fl3d post-cm 20 · axial · 216.0mm · 0.96mm/px · 1 of 1 slices shown (3 of 3)]
[im 1/1]
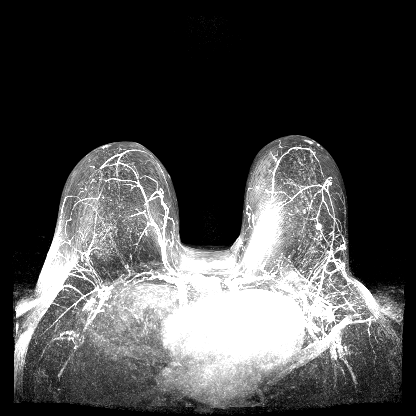

[Series 8: fl3d post-cm 3min · axial · 0.9mm · 0.96mm/px · z∈[-68,+147]mm · 3 of 240 slices shown]
[im 1/240]
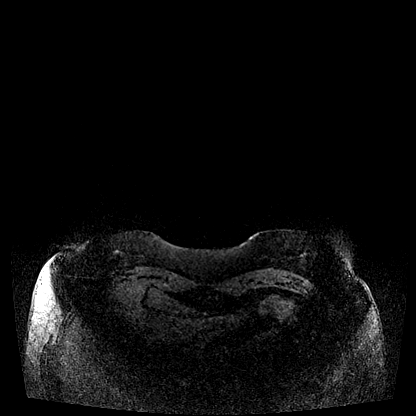
[im 120/240]
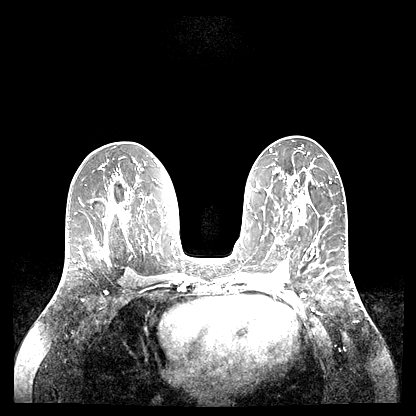
[im 240/240]
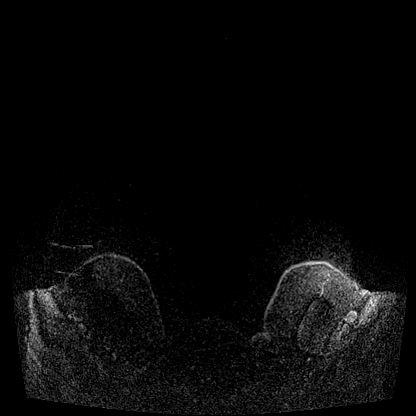

[Series 9: fl3d post-cm 3min_sub · axial · 0.9mm · 0.96mm/px · z∈[-68,+147]mm · 3 of 240 slices shown]
[im 1/240]
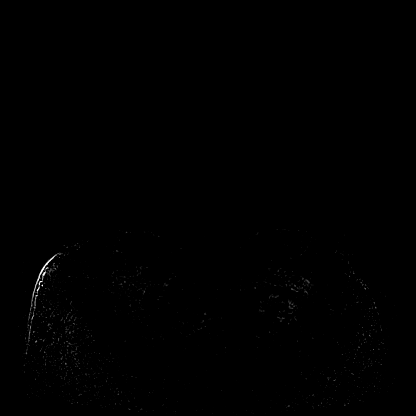
[im 120/240]
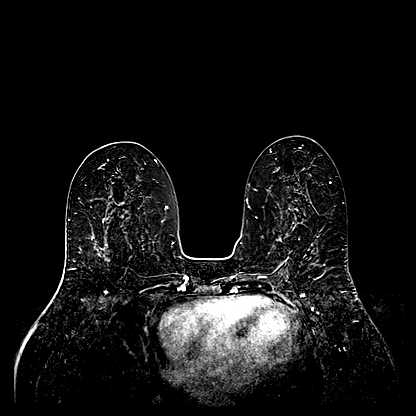
[im 240/240]
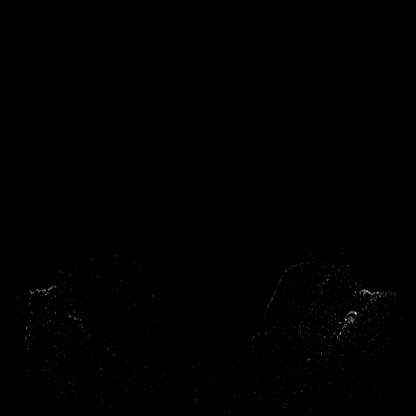

[Series 10: fl3d post-cm 3min_sub_mip_tra · axial · 216.0mm · 0.96mm/px · 1 of 1 slices shown]
[im 1/1]
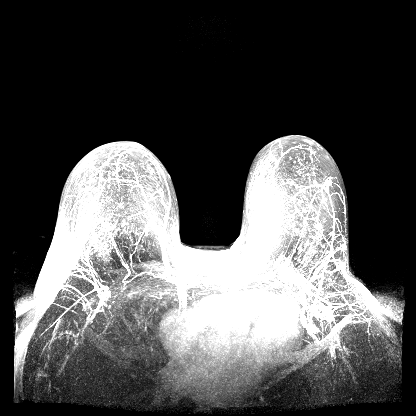

[Series 11: fl3d post-cm 5min · axial · 0.9mm · 0.96mm/px · z∈[-68,+147]mm · 3 of 240 slices shown]
[im 1/240]
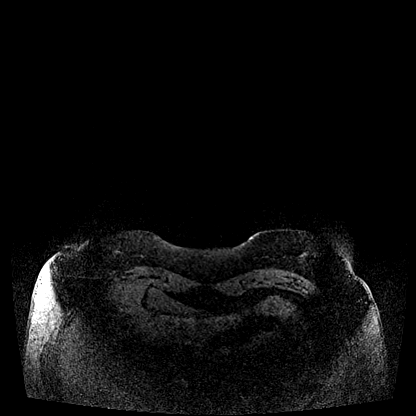
[im 120/240]
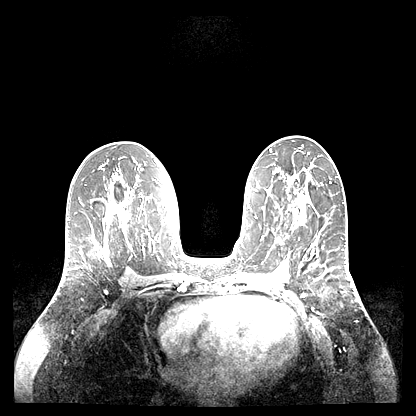
[im 240/240]
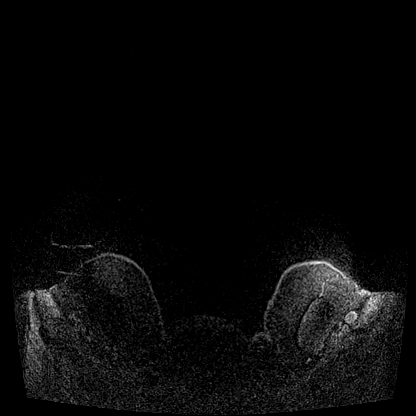

[Series 12: fl3d post-cm 5min_sub · axial · 0.9mm · 0.96mm/px · z∈[-68,+147]mm · 3 of 240 slices shown]
[im 1/240]
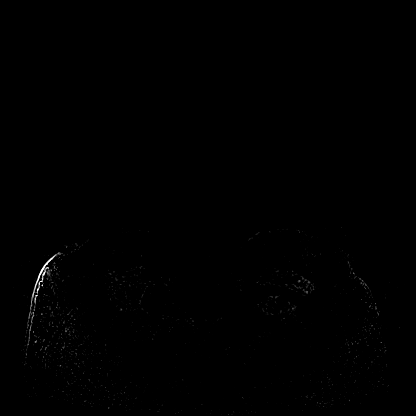
[im 120/240]
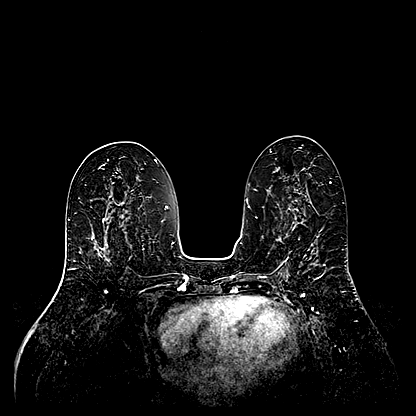
[im 240/240]
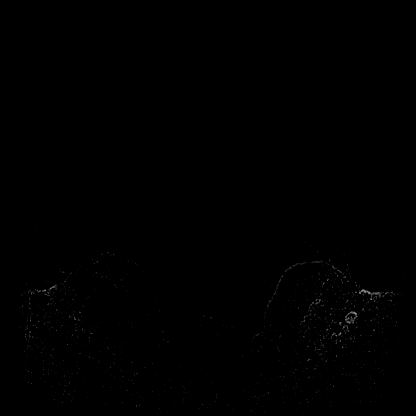

[Series 13: fl3d post-cm 5min_sub_mip_tra · axial · 216.0mm · 0.96mm/px · 1 of 1 slices shown]
[im 1/1]
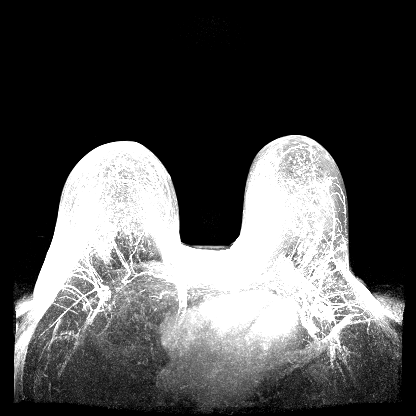

[(id) pre-cm · axial · non-contrast · 0.9mm · 0.96mm/px · z∈[-68,-42]mm · 2 of 720 slices shown]
[im 1/720]
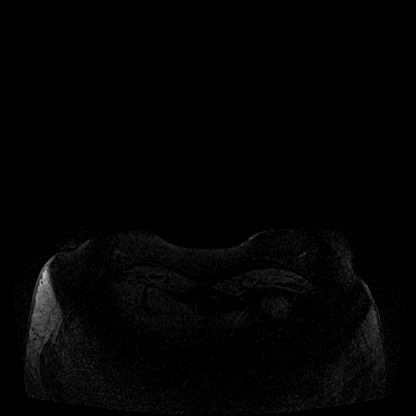
[im 90/720]
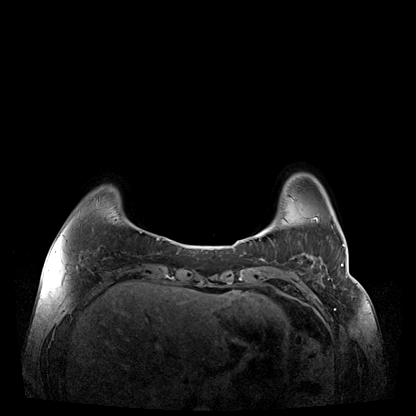

[28 of 48 positions shown; findings below may reference images not displayed]

1. Grade lll invasive ductal carcinoma in the 3 o'clock region of
the left breast 3 cm from the nipple (coil shaped clip).

2. Fibroadenoma with ductal hyperplasia in the 3 o'clock region of
the left breast 5 cm from the nipple (heart shaped clip).

3. Invasive ductal carcinoma in a left axillary lymph node (tribell
clip)

4. Invasive ductal carcinoma with lymphovascular invasion in the
left breast at 7 o'clock 7 cm from the nipple (ribbon shaped clip
that could not be seen mammographically secondary to its far
posterior location along the lower-inner quadrant of the breast).

LABS:  None obtained at the time of imaging

EXAM:
BILATERAL BREAST MRI WITH AND WITHOUT CONTRAST
Three-dimensional MR images were rendered by post-processing of the
original MR data on an independent workstation. The
three-dimensional MR images were interpreted, and findings are
reported in the following complete MRI report for this study. Three
dimensional images were evaluated at the independent interpreting
workstation using the DynaCAD thin client.
FINDINGS: Breast composition: c. Heterogeneous fibroglandular tissue.

Background parenchymal enhancement: Moderate.

Right breast: No mass or abnormal enhancement.

Left breast:

1. The irregular enhancing mass in the upper-outer quadrant of the
left breast is no longer visualized. There is a susceptibility
artifact from a biopsy marker clip (series 12, image 153).

2. There is a 5 mm enhancing mass with a susceptibility artifact
from a biopsy clip in the upper-outer quadrant consistent with the
biopsy proven fibroadenoma (series 12 image 153).

3. Susceptibility artifact from a biopsy marker clip in the deep
lower inner left breast is visualized (series 12, image 175). The
enhancing mass in this area is no longer visualized.

Lymph nodes: No abnormal appearing lymph nodes.

Ancillary findings: Left axillary lymph nodes are no longer
pathologically enlarged. They have a normal morphology.
IMPRESSION: 1. Interval resolution of the enhancing masses in the upper-outer
quadrant of the left breast and deep lower inner quadrant of the
left breast in the areas of biopsy proven malignancy consistent with
response to neoadjuvant chemotherapy.

2. 5 mm enhancing mass in the upper-outer left breast consistent
with a benign fibroadenoma.

3. The previously seen abnormal level 1 axillary lymph nodes are no
longer pathologically enlarged.

RECOMMENDATION:
Continue treatment planning of the biopsy proven malignancies in the
3 o'clock (coil shaped clip) and 7 o'clock (ribbon shaped clip)
regions of the left breast and left metastatic axillary adenopathy
(tribell shaped clip) is recommended.

BI-RADS CATEGORY  6: Known biopsy-proven malignancy.

## 2021-01-20 MED ORDER — GADOBUTROL 1 MMOL/ML IV SOLN
9.0000 mL | Freq: Once | INTRAVENOUS | Status: AC | PRN
  Administered 2021-01-20: 9 mL via INTRAVENOUS

## 2021-01-24 DIAGNOSIS — C773 Secondary and unspecified malignant neoplasm of axilla and upper limb lymph nodes: Secondary | ICD-10-CM | POA: Diagnosis not present

## 2021-01-24 DIAGNOSIS — C50919 Malignant neoplasm of unspecified site of unspecified female breast: Secondary | ICD-10-CM | POA: Diagnosis not present

## 2021-01-30 DIAGNOSIS — Z170 Estrogen receptor positive status [ER+]: Secondary | ICD-10-CM | POA: Diagnosis not present

## 2021-01-30 DIAGNOSIS — C50412 Malignant neoplasm of upper-outer quadrant of left female breast: Secondary | ICD-10-CM | POA: Diagnosis not present

## 2021-02-04 ENCOUNTER — Encounter: Payer: Self-pay | Admitting: *Deleted

## 2021-02-05 ENCOUNTER — Encounter: Payer: Self-pay | Admitting: *Deleted

## 2021-02-06 ENCOUNTER — Encounter: Payer: Self-pay | Admitting: *Deleted

## 2021-02-06 DIAGNOSIS — Z170 Estrogen receptor positive status [ER+]: Secondary | ICD-10-CM

## 2021-02-06 DIAGNOSIS — C50412 Malignant neoplasm of upper-outer quadrant of left female breast: Secondary | ICD-10-CM

## 2021-02-07 ENCOUNTER — Telehealth: Payer: Self-pay | Admitting: Hematology and Oncology

## 2021-02-07 NOTE — Telephone Encounter (Signed)
Scheduled appt per 7/7 sch msg. Pt aware.  

## 2021-02-10 ENCOUNTER — Other Ambulatory Visit: Payer: Self-pay | Admitting: Hematology and Oncology

## 2021-02-10 DIAGNOSIS — R110 Nausea: Secondary | ICD-10-CM

## 2021-02-12 ENCOUNTER — Encounter: Payer: Self-pay | Admitting: *Deleted

## 2021-02-13 ENCOUNTER — Encounter: Payer: Self-pay | Admitting: Hematology and Oncology

## 2021-02-13 NOTE — Telephone Encounter (Signed)
Will forward to Dr Lindi Adie-- its his patient. Thanks [place]

## 2021-02-17 ENCOUNTER — Encounter: Payer: Self-pay | Admitting: Adult Health

## 2021-02-19 ENCOUNTER — Encounter (HOSPITAL_BASED_OUTPATIENT_CLINIC_OR_DEPARTMENT_OTHER): Payer: Self-pay | Admitting: General Surgery

## 2021-02-19 ENCOUNTER — Other Ambulatory Visit: Payer: Self-pay

## 2021-02-20 ENCOUNTER — Inpatient Hospital Stay: Payer: BC Managed Care – PPO | Admitting: Emergency Medicine

## 2021-02-20 ENCOUNTER — Other Ambulatory Visit: Payer: Self-pay

## 2021-02-20 ENCOUNTER — Inpatient Hospital Stay: Payer: BC Managed Care – PPO | Attending: Adult Health | Admitting: Hematology and Oncology

## 2021-02-20 DIAGNOSIS — Z170 Estrogen receptor positive status [ER+]: Secondary | ICD-10-CM | POA: Insufficient documentation

## 2021-02-20 DIAGNOSIS — C50412 Malignant neoplasm of upper-outer quadrant of left female breast: Secondary | ICD-10-CM | POA: Insufficient documentation

## 2021-02-20 DIAGNOSIS — Z9221 Personal history of antineoplastic chemotherapy: Secondary | ICD-10-CM | POA: Insufficient documentation

## 2021-02-20 MED ORDER — GABAPENTIN 100 MG PO CAPS: 300.0000 mg | ORAL_CAPSULE | Freq: Two times a day (BID) | ORAL | 3 refills | Status: DC

## 2021-02-20 NOTE — Progress Notes (Signed)
Patient Care Team: [name], MD as PCP - General (Family Medicine)  DIAGNOSIS:  Encounter Diagnosis  Name Primary?   Malignant neoplasm of upper-outer quadrant of left breast in female, estrogen receptor positive ([place])     SUMMARY OF ONCOLOGIC HISTORY: Oncology History  Malignant neoplasm of upper-outer quadrant of left breast in female, estrogen receptor positive ([place])  08/21/2020 Initial Diagnosis   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 No pathogenic variants identified on the [facility] Breast Cancer STAT Panel + Multi-Cancer Panel. [facility] in [facility] called c.983C>T identified. The report date is 09/04/2020.   The STAT Breast cancer panel offered by [facility] includes sequencing and rearrangement analysis for the following 9 genes:  ATM, BRCA1, BRCA2, CDH1, CHEK2, PALB2, PTEN, STK11 and TP53.    The Multi-Cancer Panel offered by [facility] includes sequencing and/or deletion duplication testing of the following 85 genes: AIP, ALK, APC, ATM, AXIN2,BAP1,  BARD1, BLM, BMPR1A, BRCA1, BRCA2, BRIP1, CASR, CDC73, CDH1, CDK4, CDKN1B, CDKN1C, CDKN2A (p14ARF), CDKN2A (p16INK4a), CEBPA, CHEK2, CTNNA1, DICER1, DIS3L2, EGFR (c.2369C>T, p.Thr790Met variant only), EPCAM (Deletion/duplication testing only), FH, FLCN, GATA2, GPC3, GREM1 (Promoter region deletion/duplication testing only), HOXB13 (c.251G>A, p.Gly84Glu), HRAS, KIT,  MAX, MEN1, MET, MITF (c.952G>A, p.Glu318Lys variant only), MLH1, MSH2, MSH3, MSH6, MUTYH, NBN, NF1, NF2, NTHL1, PALB2, PDGFRA, PHOX2B, PMS2, POLD1, POLE, POT1, PRKAR1A, PTCH1, PTEN, RAD50, RAD51C, RAD51D, RB1, RECQL4, RET, RNF43, RUNX1, SDHAF2, SDHA (sequence changes only), SDHB, SDHC, SDHD, SMAD4, SMARCA4, SMARCB1, SMARCE1, STK11, SUFU, TERC, TERT, TMEM127, TP53, TSC1, TSC2, VHL, WRN and WT1.    09/06/2020 -  Chemotherapy    Patient is on Treatment Plan: BREAST ADJUVANT DOSE DENSE AC Q14D / PACLITAXEL Q7D         CHIEF COMPLIANT: Chemo induced peripheral neuropathy  INTERVAL HISTORY: Tracy Dyer is a 50 year old above-mentioned history of breast cancer who has received neoadjuvant chemotherapy with dose dense Adriamycin and Cytoxan followed by Taxol.  She is going to undergo lumpectomy surgery.  After completion of treatment she has developed peripheral neuropathy and we prescribed her gabapentin.  She is taking 100 mg at bedtime and it is helping her but she continues to suffer from neuropathy symptoms especially burning sensation in her hands.  She also has cramps in her hands and stiffness in the muscles of the hands.  She wanted to learn more about the neuropathy clinical trial.   ALLERGIES:  is allergic to sulfa antibiotics, hydromorphone, oxycodone, propoxyphene, and sulfamethoxazole.  MEDICATIONS:  Current Outpatient Medications  Medication Sig Dispense Refill   albuterol (VENTOLIN HFA) 108 (90 Base) MCG/ACT inhaler Inhale 1-2 puffs into the lungs every 4 (four) hours as needed for wheezing or shortness of breath. 8 g 2   clonazePAM (KLONOPIN) 1 MG tablet TAKE 1 TABLET BY MOUTH 2 TIMES DAILY AS NEEDED FOR ANXIETY. 60 tablet 1   fluticasone (FLONASE) 50 MCG/ACT nasal spray Place 1 spray into both nostrils daily as needed for  allergies.     gabapentin (NEURONTIN) 100 MG capsule Take 3 capsules (300 mg total) by mouth 2 (two) times daily. 180 capsule 3   l-methylfolate-B6-B12  (METANX) 3-35-2 MG TABS tablet Take 1 tablet by mouth daily.     lidocaine-prilocaine (EMLA) cream Apply to affected area once 30 g 3   LORazepam (ATIVAN) 0.5 MG tablet TAKE 1 TABLET BY MOUTH TWICE A DAY FOR NAUSEA OR ANXIETY. DO NOT TAKE AT THE SAME TIME AS CLONAZEPAM 40 tablet 0   MAGNESIUM CITRATE PO Take 405 mg by mouth daily. 135 mg     Multiple Vitamins-Minerals (MULTIVITAMIN WITH MINERALS) tablet Take 1 tablet by mouth daily.     omeprazole (PRILOSEC) 20 MG capsule Take 1 capsule (20 mg total) by mouth daily. 30 capsule 3   sertraline (ZOLOFT) 100 MG tablet Take 100 mg by mouth at bedtime.     traMADol (ULTRAM) 50 MG tablet Take 1 tablet (50 mg total) by mouth every 6 (six) hours as needed. 10 tablet 0   No current facility-administered medications for this visit.    PHYSICAL EXAMINATION: ECOG PERFORMANCE STATUS: 1 - Symptomatic but completely ambulatory  Vitals:   02/20/21 1505  BP: (!) 110/50  Pulse: 65  Resp: 18  Temp: 98 F (36.7 C)  SpO2: 98%   Filed Weights   02/20/21 1505  Weight: 204 lb 4.8 oz (92.7 kg)      LABORATORY DATA:  I have reviewed the data as listed CMP Latest Ref Rng & Units 01/17/2021 01/10/2021 01/03/2021  Glucose 70 - 99 mg/dL 97 108(H) 102(H)  BUN 6 - 20 mg/dL _0 Creatinine 0.44 - 1.00 mg/dL 0.82 0.75 0.81  Sodium 135 - 145 mmol/L 139 139 140  Potassium 3.5 - 5.1 mmol/L 3.9 3.7 3.9  Chloride 98 - 111 mmol/L 109 108 107  CO2 22 - 32 mmol/L _1 Calcium 8.9 - 10.3 mg/dL 8.8(L) 9.1 9.4  Total Protein 6.5 - 8.1 g/dL 6.2(L) 6.2(L) 6.1(L)  Total Bilirubin 0.3 - 1.2 mg/dL 0.4 0.3 0.3  Alkaline Phos 38 - 126 U/L 60 60 59  AST 15 - 41 U/L _2 ALT 0 - 44 U/L _3 Lab Results  Component Value Date   WBC 3.2 (L) 01/17/2021   HGB 12.4 01/17/2021   HCT 35.8 (L) 01/17/2021   MCV 92.0 01/17/2021   PLT 214 01/17/2021   NEUTROABS 1.8 01/17/2021    ASSESSMENT & PLAN:  Malignant neoplasm of upper-outer quadrant of left breast in  female, estrogen receptor positive ([place]) 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  Neoadjuvant chemotherapy with dose dense Adriamycin and Cytoxan followed by Taxol weekly x12 2. breast conserving surgery with sentinel lymph node and targeted node dissection, at a later point she will undergo mastectomy with reconstruction with D IEP flap 3.  Adjuvant radiation therapy 4.  Followed by adjuvant antiestrogen therapy. ------------------------------------------------------------------------------------------------------------------------- Current treatment:  Completed 4 cycles of dose dense Adriamycin Cytoxan, today is cycle 12 Taxol Echocardiogram 09/04/20 : EF 60-65%   Chemo Toxicities:  1.  Fatigue 2. chemo induced peripheral neuropathy: Patient rates her symptoms as 6/10.  Gabapentin is helping her go to sleep but it is not helping her through the day.  I recommended participation in the phase 2 [facility] [place] 2102 study of N-Palmitoylrthanolamide vs placebo for the treatment of chemo induced peripheral neuropathy.   Our research team was able to provide her with information about the study.  Patient is going to undergo surgery on 02/26/2021.  I will be talking to her postoperatively to discuss final pathology report on 03/10/2021.    No orders of the defined types were placed in this encounter.  The patient has a good understanding of  the overall plan. she agrees with it. she will call with any problems that may develop before the next visit here. Total time spent: 30 mins including face to face time and time spent for planning, charting and co-ordination of care   Harriette Ohara, MD 02/20/21

## 2021-02-20 NOTE — Assessment & Plan Note (Signed)
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%, PR 95%, HER2 equivocal, FISH pending, Ki-67 20% T1CN1 stage IIa MammaPrint: High risk: Luminal type B, probability of path CR 6% with chemo and antiestrogen therapy predicted benefit of treatment at 5 years 94.6%, average 10-year risk of recurrence untreated: 29%  Treatment plan: 1.Neoadjuvant chemotherapy with dose dense Adriamycin and Cytoxan followed by Taxol weekly x12 2.breast conserving surgery with sentinel lymph node and targeted node dissection, at a later point she will undergo mastectomy with reconstruction with D IEP flap 3.Adjuvant radiation therapy 4.Followed by adjuvant antiestrogen therapy. ------------------------------------------------------------------------------------------------------------------------- Current treatment: Completed 4 cycles ofdose dense Adriamycin Cytoxan, today is cycle12Taxol Echocardiogram2/2/22: EF 60-65%  Chemo Toxicities: 1.  Fatigue 2. chemo induced peripheral neuropathy: Patient rates her symptoms as 6/10.  Gabapentin is helping her go to sleep but it is not helping her through the day.  I recommended participation in the phase 2 [facility] [place] 2102 study of N-Palmitoylrthanolamide vs placebo for the treatment of chemo induced peripheral neuropathy.  Our research team was able to provide her with information about the study.  Patient is going to undergo surgery on 02/26/2021.  I will be talking to her postoperatively to discuss final pathology report on 03/10/2021.

## 2021-02-20 NOTE — Research (Signed)
TRIAL: [facility]-[place]-2102 - TREATMENT OF ESTABLISHED CHEMOTHERAPY-INDUCED NEUROPATHY WITH N-PALMITOYLETHANOLAMIDE, A CANNABIMIMETIC NUTRACEUTICAL: A RANDOMIZED DOUBLE-BLIND PHASE II PILOT TRIAL  Patient Tracy Dyer was identified by MD Lindi Adie as a potential candidate for the above listed study.  This Clinical Research Nurse met with Tracy Dyer, [id number], on 02/20/21 in a manner and location that ensures patient privacy to discuss participation in the above listed research study.  Patient is Unaccompanied.  A copy of the informed consent document with embedded HIPAA language was provided to the patient.  Patient reads, speaks, and understands [place].   Patient was provided with the business card of this Nurse and encouraged to contact the research team with any questions.  Approximately 30 minutes were spent with the patient reviewing the informed consent documents.  Patient was provided the option of taking informed consent documents home to review and was encouraged to review at their convenience with their support network, including other care providers. Patient took the consent documents home to review.  Patient is not eligible for study at this time, needs to wait until 3 months post-chemo to be enrolled (finished chemo 01/17/21, eligible 04/19/21).  Will contact pt closer to potential enrollment time to see if pt is still interested.  Pt and MD aware.  Pt aware to contact Research as needed with any questions/concerns before then.  Wells Guiles '[place], RN, BSN Clinical Research Nurse I 02/20/21 4:01 PM

## 2021-02-24 ENCOUNTER — Other Ambulatory Visit: Payer: Self-pay | Admitting: General Surgery

## 2021-02-24 ENCOUNTER — Other Ambulatory Visit: Payer: Self-pay

## 2021-02-24 ENCOUNTER — Ambulatory Visit
Admission: RE | Admit: 2021-02-24 | Discharge: 2021-02-24 | Disposition: A | Discharge status: Home / Self Care | Payer: BC Managed Care – PPO | Source: Ambulatory Visit | Attending: General Surgery | Admitting: General Surgery

## 2021-02-24 DIAGNOSIS — Z170 Estrogen receptor positive status [ER+]: Secondary | ICD-10-CM

## 2021-02-24 DIAGNOSIS — C50412 Malignant neoplasm of upper-outer quadrant of left female breast: Secondary | ICD-10-CM

## 2021-02-24 DIAGNOSIS — C50912 Malignant neoplasm of unspecified site of left female breast: Secondary | ICD-10-CM | POA: Diagnosis not present

## 2021-02-24 DIAGNOSIS — N6324 Unspecified lump in the left breast, lower inner quadrant: Secondary | ICD-10-CM | POA: Diagnosis not present

## 2021-02-24 IMAGING — MG MM PLC BREAST LOC DEV 1ST LESION INC MAMMO GUIDE*L*
6 series · 6 of 6 positions shown · non-contrast
Comparison: Previous exam(s).

CLINICAL DATA: Localization of the patient's known malignancy in
the lateral left breast.

EXAM:
MAMMOGRAPHIC GUIDED RADIOACTIVE SEED LOCALIZATION OF THE LEFT BREAST

[L LM (1 of 4)]
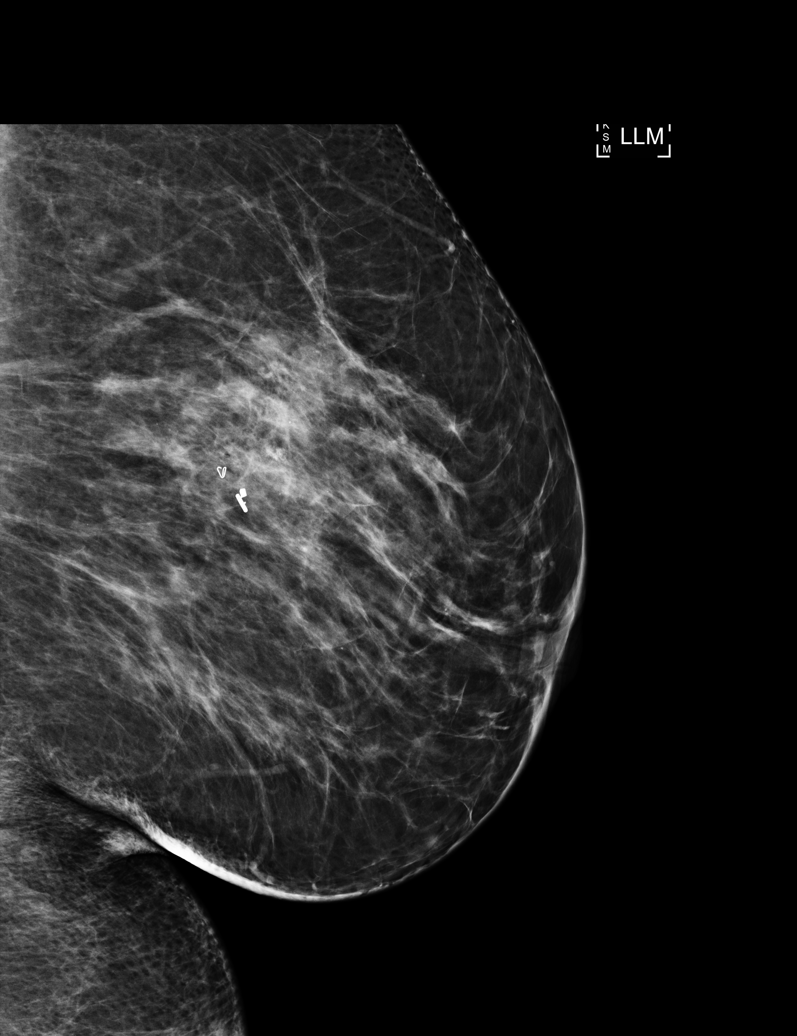

[L LM (2 of 4)]
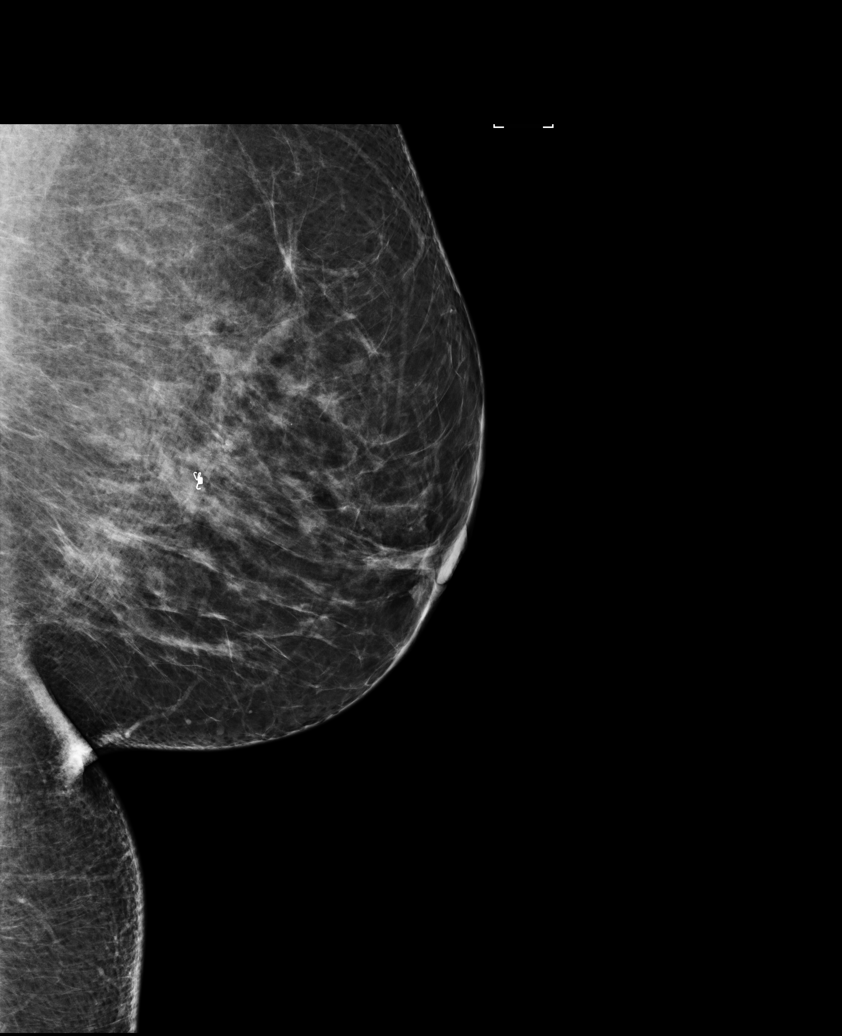

[L LM (3 of 4)]
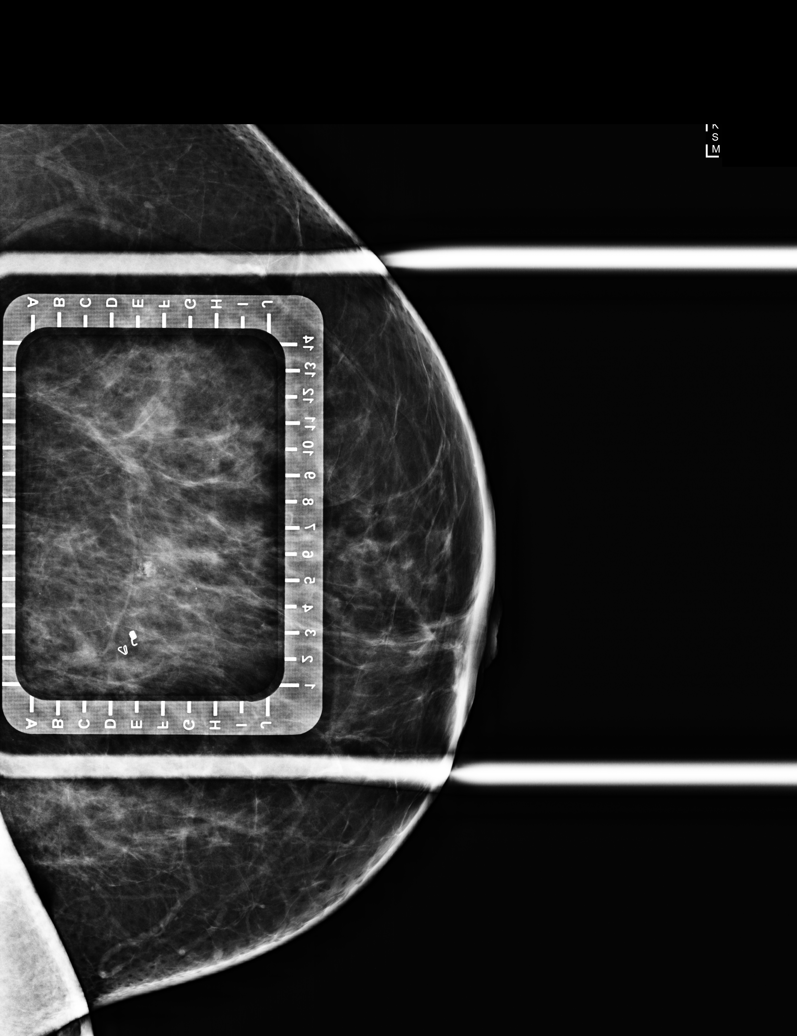

[L CC (1 of 2)]
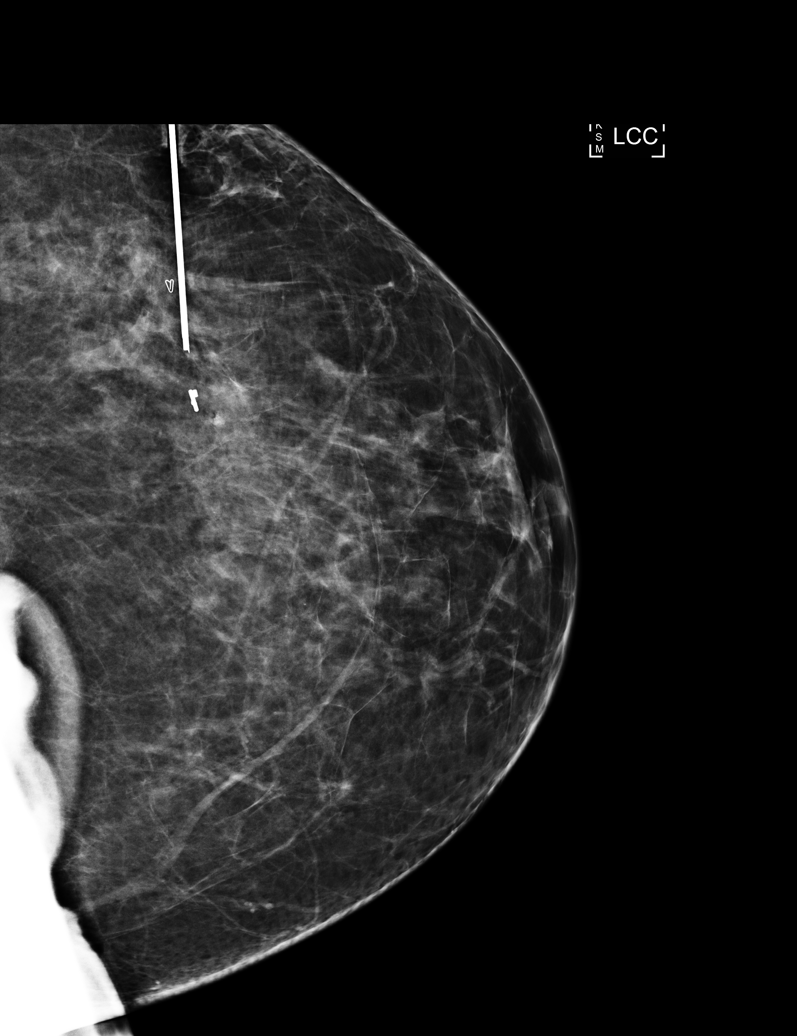

[L CC (2 of 2)]
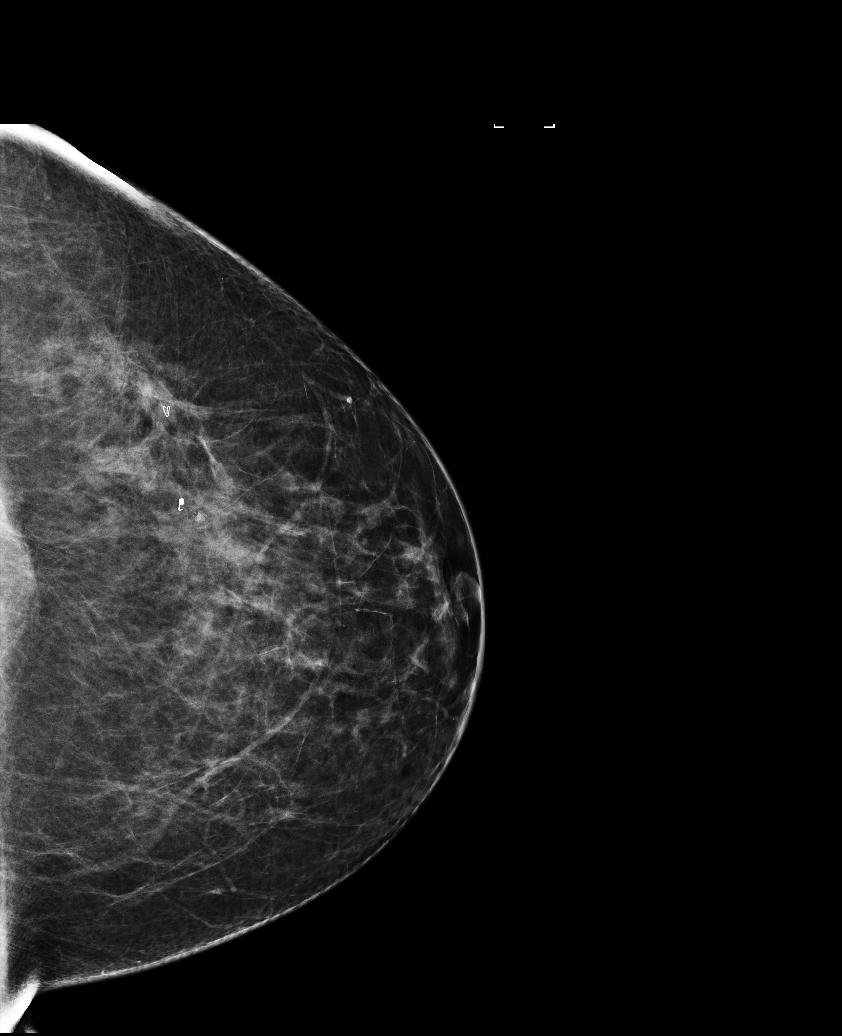

[L LM (4 of 4)]
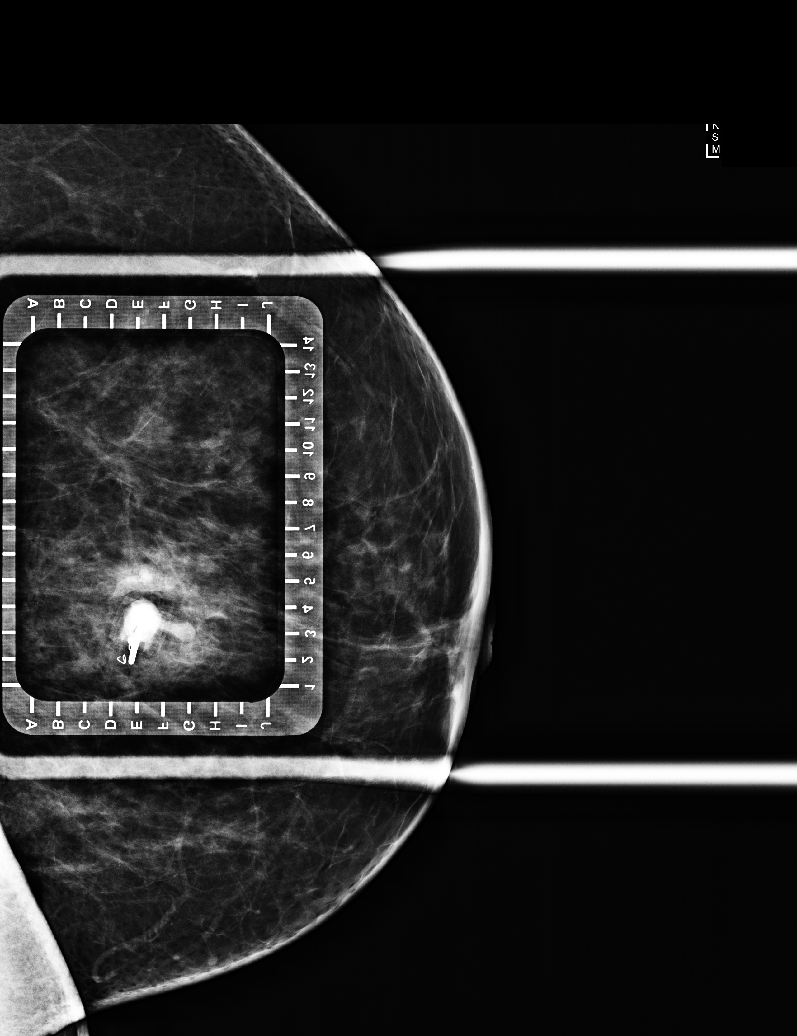

[6 of 6 positions shown; findings below may reference images not displayed]



The usual time-out protocol was performed immediately prior to the
procedure.

Using mammographic guidance, sterile technique, 1% lidocaine and an
[IM] radioactive seed, the coil shaped clip marking the patient's
known lateral left breast malignancy was localized using a superior
approach. The follow-up mammogram images confirm the seed in the
expected location and were marked for Dr. LINKUS.

Follow-up survey of the patient confirms presence of the radioactive
seed.

Order number of [IM] seed:  [PHONE_NUMBER].

Total activity:  0.254 millicuries reference Date: [DATE]

The patient tolerated the procedure well and was released from the
[REDACTED]. She was given instructions regarding seed removal.
IMPRESSION: Radioactive seed localization left breast. No apparent
complications.

## 2021-02-24 NOTE — Progress Notes (Addendum)
Deleted entry.

## 2021-02-26 ENCOUNTER — Ambulatory Visit
Admission: RE | Admit: 2021-02-26 | Discharge: 2021-02-26 | Disposition: A | Discharge status: Home / Self Care | Payer: BC Managed Care – PPO | Source: Ambulatory Visit | Attending: General Surgery | Admitting: General Surgery

## 2021-02-26 ENCOUNTER — Ambulatory Visit (HOSPITAL_BASED_OUTPATIENT_CLINIC_OR_DEPARTMENT_OTHER): Payer: BC Managed Care – PPO | Admitting: Anesthesiology

## 2021-02-26 ENCOUNTER — Encounter: Payer: Self-pay | Admitting: Hematology and Oncology

## 2021-02-26 ENCOUNTER — Other Ambulatory Visit: Payer: Self-pay

## 2021-02-26 ENCOUNTER — Other Ambulatory Visit: Payer: Self-pay | Admitting: General Surgery

## 2021-02-26 ENCOUNTER — Ambulatory Visit (HOSPITAL_COMMUNITY)
Admission: RE | Admit: 2021-02-26 | Discharge: 2021-02-26 | Disposition: A | Discharge status: Home / Self Care | Payer: BC Managed Care – PPO | Source: Ambulatory Visit | Attending: General Surgery | Admitting: General Surgery

## 2021-02-26 ENCOUNTER — Encounter (HOSPITAL_BASED_OUTPATIENT_CLINIC_OR_DEPARTMENT_OTHER)
Admission: RE | Disposition: A | Discharge status: Home / Self Care | Payer: Self-pay | Source: Home / Self Care | Attending: General Surgery

## 2021-02-26 ENCOUNTER — Ambulatory Visit (HOSPITAL_BASED_OUTPATIENT_CLINIC_OR_DEPARTMENT_OTHER)
Admission: RE | Admit: 2021-02-26 | Discharge: 2021-02-26 | Disposition: A | Discharge status: Home / Self Care | Payer: BC Managed Care – PPO | Attending: General Surgery | Admitting: General Surgery

## 2021-02-26 ENCOUNTER — Encounter (HOSPITAL_BASED_OUTPATIENT_CLINIC_OR_DEPARTMENT_OTHER): Payer: Self-pay | Admitting: General Surgery

## 2021-02-26 DIAGNOSIS — Z170 Estrogen receptor positive status [ER+]: Secondary | ICD-10-CM

## 2021-02-26 DIAGNOSIS — N6012 Diffuse cystic mastopathy of left breast: Secondary | ICD-10-CM | POA: Diagnosis not present

## 2021-02-26 DIAGNOSIS — Z5111 Encounter for antineoplastic chemotherapy: Secondary | ICD-10-CM | POA: Insufficient documentation

## 2021-02-26 DIAGNOSIS — D242 Benign neoplasm of left breast: Secondary | ICD-10-CM | POA: Insufficient documentation

## 2021-02-26 DIAGNOSIS — Z882 Allergy status to sulfonamides status: Secondary | ICD-10-CM | POA: Insufficient documentation

## 2021-02-26 DIAGNOSIS — Z809 Family history of malignant neoplasm, unspecified: Secondary | ICD-10-CM | POA: Insufficient documentation

## 2021-02-26 DIAGNOSIS — Z803 Family history of malignant neoplasm of breast: Secondary | ICD-10-CM | POA: Insufficient documentation

## 2021-02-26 DIAGNOSIS — Z801 Family history of malignant neoplasm of trachea, bronchus and lung: Secondary | ICD-10-CM | POA: Insufficient documentation

## 2021-02-26 DIAGNOSIS — C50412 Malignant neoplasm of upper-outer quadrant of left female breast: Secondary | ICD-10-CM

## 2021-02-26 DIAGNOSIS — Z79899 Other long term (current) drug therapy: Secondary | ICD-10-CM | POA: Diagnosis not present

## 2021-02-26 DIAGNOSIS — N6092 Unspecified benign mammary dysplasia of left breast: Secondary | ICD-10-CM | POA: Diagnosis not present

## 2021-02-26 DIAGNOSIS — C50912 Malignant neoplasm of unspecified site of left female breast: Secondary | ICD-10-CM | POA: Diagnosis not present

## 2021-02-26 DIAGNOSIS — Z885 Allergy status to narcotic agent status: Secondary | ICD-10-CM | POA: Insufficient documentation

## 2021-02-26 DIAGNOSIS — Z9221 Personal history of antineoplastic chemotherapy: Secondary | ICD-10-CM | POA: Diagnosis not present

## 2021-02-26 DIAGNOSIS — N6022 Fibroadenosis of left breast: Secondary | ICD-10-CM | POA: Diagnosis not present

## 2021-02-26 DIAGNOSIS — Z8041 Family history of malignant neoplasm of ovary: Secondary | ICD-10-CM | POA: Diagnosis not present

## 2021-02-26 DIAGNOSIS — C50812 Malignant neoplasm of overlapping sites of left female breast: Secondary | ICD-10-CM | POA: Insufficient documentation

## 2021-02-26 DIAGNOSIS — C50312 Malignant neoplasm of lower-inner quadrant of left female breast: Secondary | ICD-10-CM | POA: Diagnosis not present

## 2021-02-26 DIAGNOSIS — G8918 Other acute postprocedural pain: Secondary | ICD-10-CM | POA: Diagnosis not present

## 2021-02-26 DIAGNOSIS — I341 Nonrheumatic mitral (valve) prolapse: Secondary | ICD-10-CM | POA: Diagnosis not present

## 2021-02-26 HISTORY — PX: BREAST LUMPECTOMY WITH RADIOACTIVE SEED AND SENTINEL LYMPH NODE BIOPSY: SHX6550

## 2021-02-26 HISTORY — DX: Depression, unspecified: F32.A

## 2021-02-26 LAB — POCT PREGNANCY, URINE: Preg Test, Ur: NEGATIVE

## 2021-02-26 IMAGING — MG MM PLC BREAST LOC DEV EA ADD LESION INC MAMMO GUIDE*L*
7 series · 7 of 7 positions shown · non-contrast
Comparison: Previous exam(s).

CLINICAL DATA: Biopsy proven invasive ductal carcinoma far
posteriorly in the left breast (ribbon shaped clip). Patient is
status post neoadjuvant chemotherapy. The mass was no longer seen
sonographically therefore seed placement could not be placed with
sonographic guidance. The ribbon shaped clip was located far
posteriorly and not seen on prior mammograms so the seed placement
could not be placed mammographically. A Q shaped clip was placed
with MR guidance for localization purposes.

EXAM:
MAMMOGRAPHIC GUIDED RADIOACTIVE SEED LOCALIZATION OF THE LEFT BREAST

[L CC (1 of 4)]
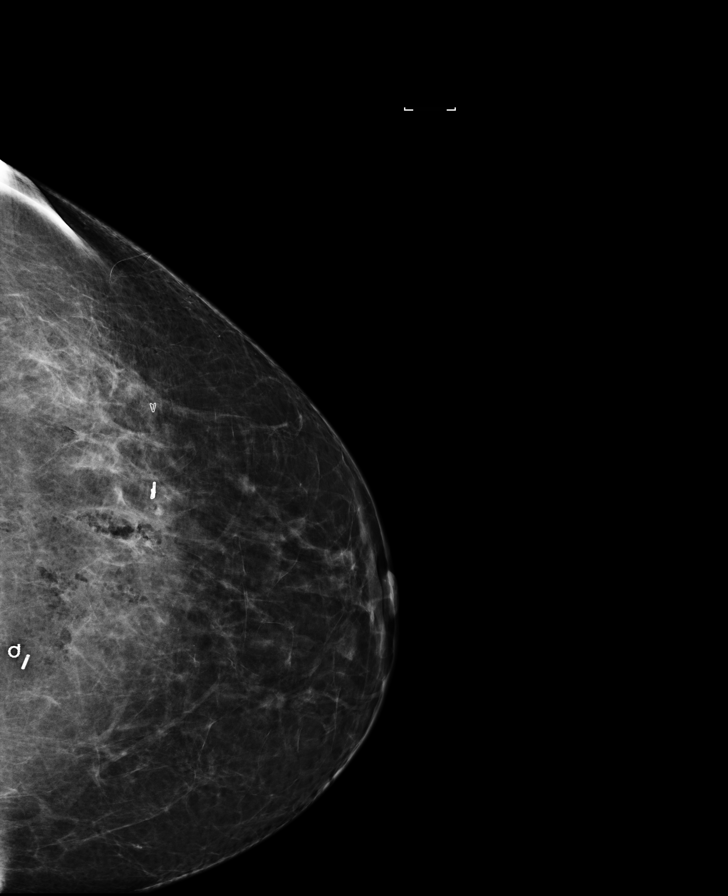

[L CC (2 of 4)]
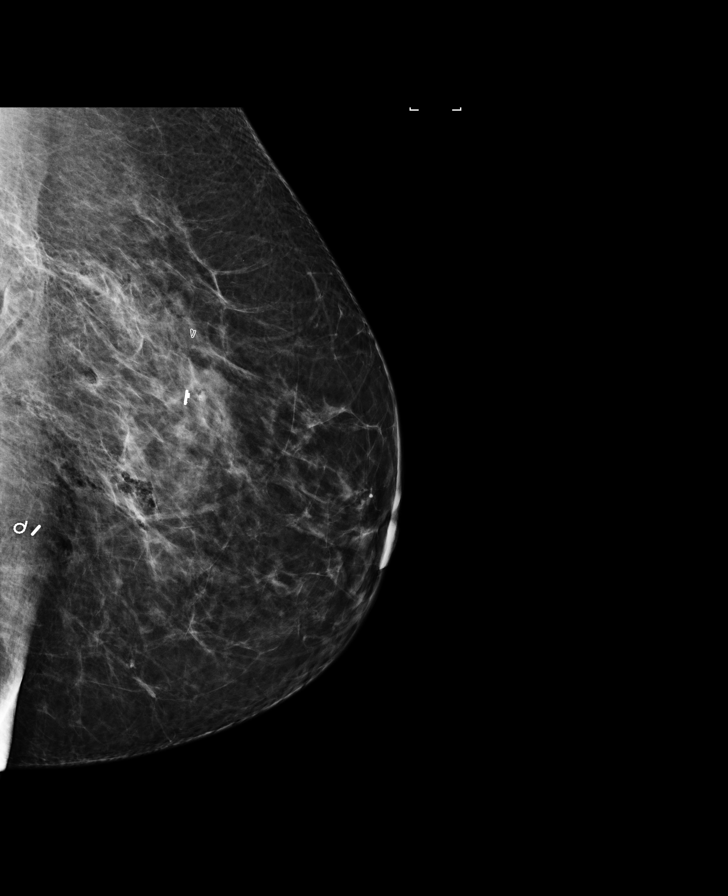

[L LM (1 of 3)]
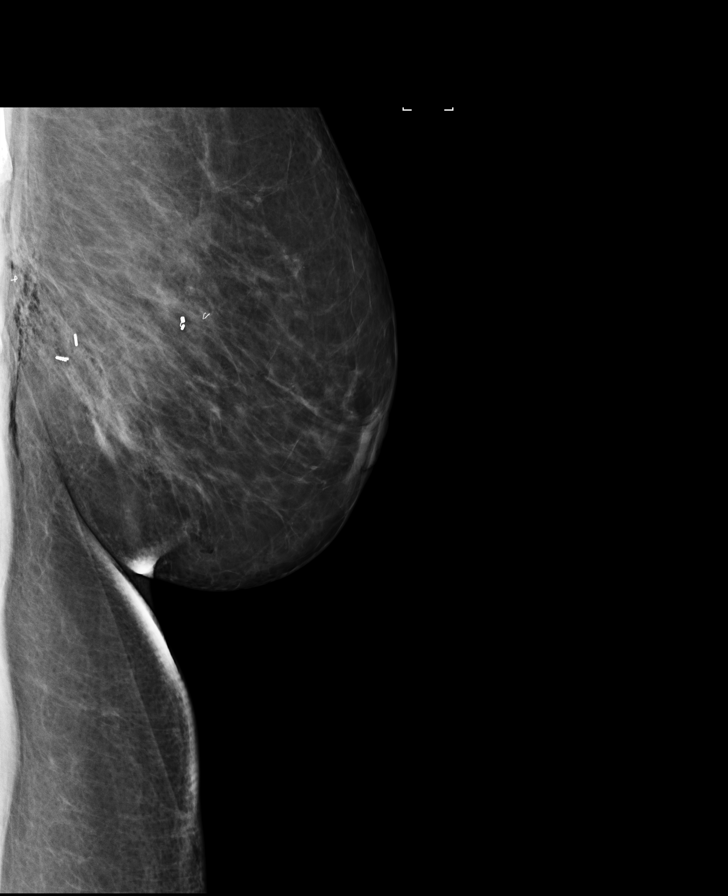

[L LM (2 of 3)]
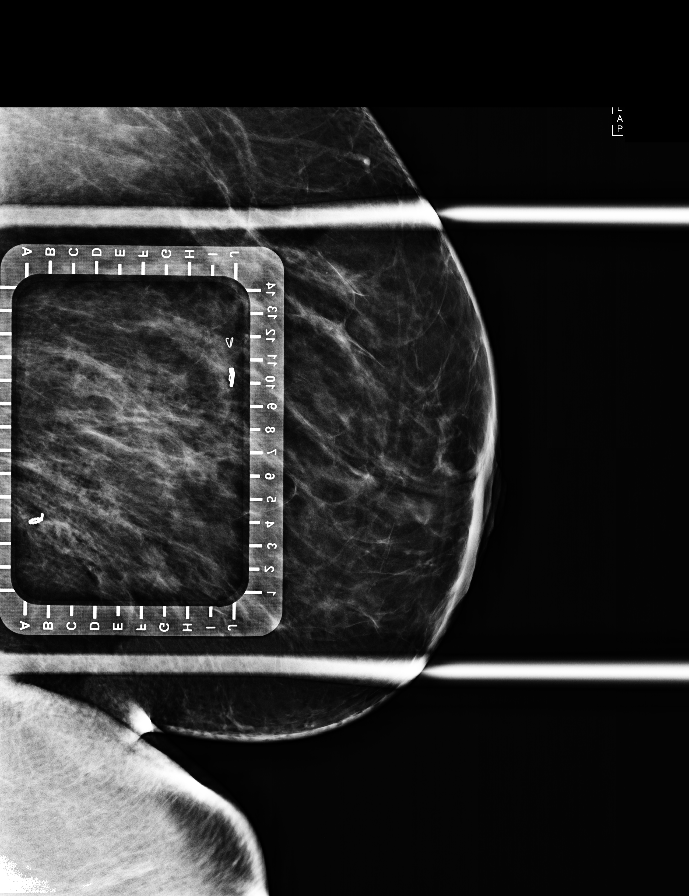

[L LM (3 of 3)]
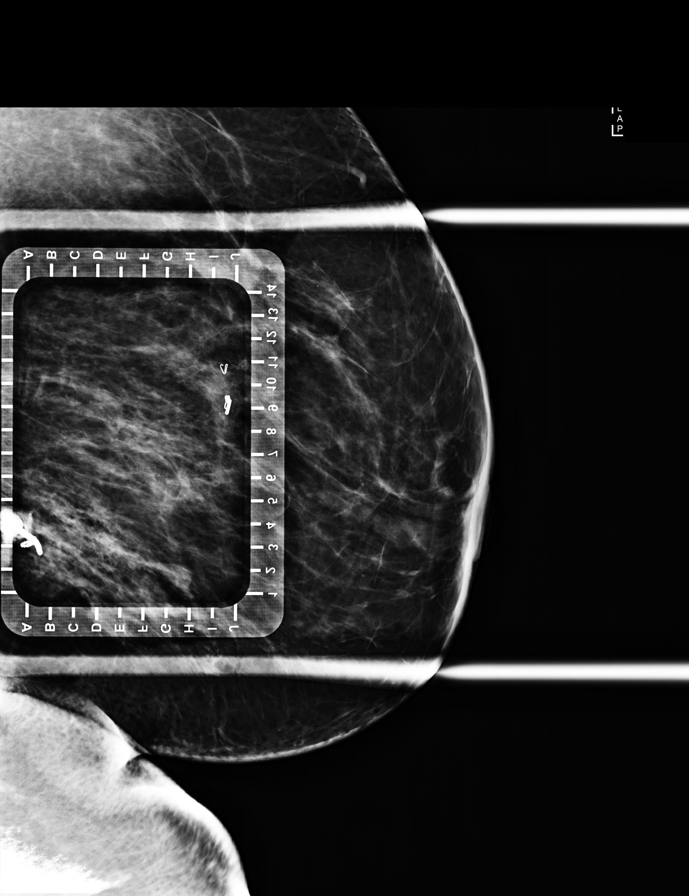

[L CC (3 of 4)]
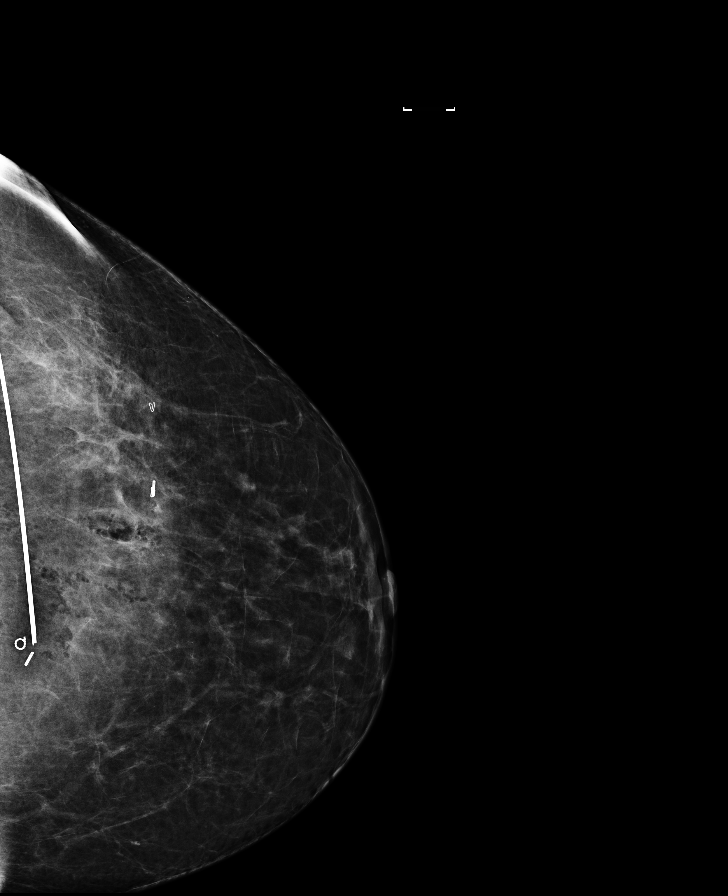

[L CC (4 of 4)]
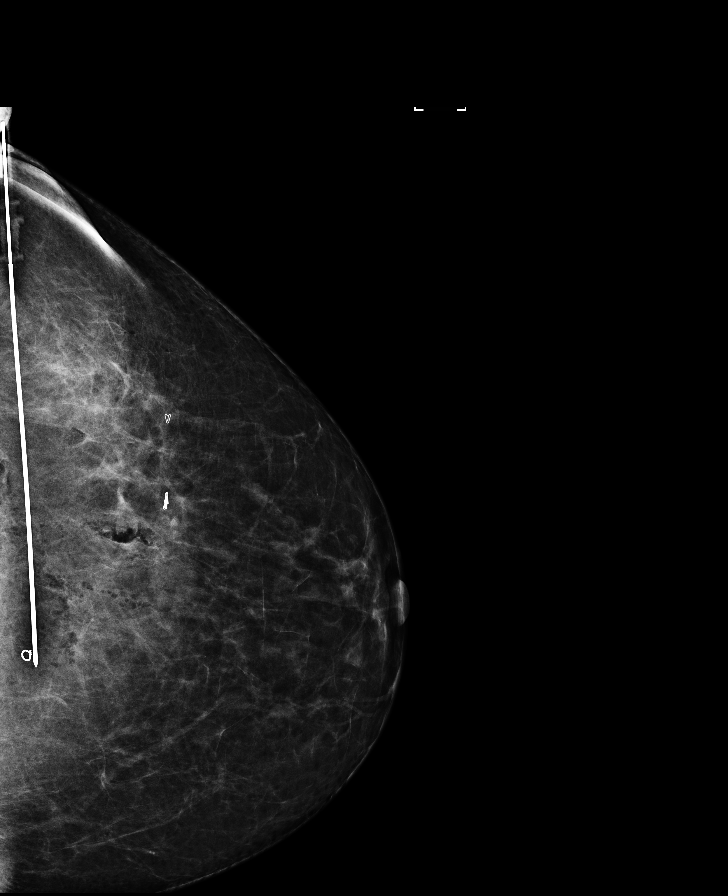

[7 of 7 positions shown; findings below may reference images not displayed]



The usual time-out protocol was performed immediately prior to the
procedure.

Using mammographic guidance, sterile technique, 1% lidocaine and an
[OV] radioactive seed, the Q shaped clip was localized using a
lateral to medial approach. The follow-up mammogram images confirm
the seed in the expected location and were marked for Dr. MORDY.

Follow-up survey of the patient confirms presence of the radioactive
seed.

Order number of [OV] seed:  [PHONE_NUMBER].

Total activity:  0.254 millicurie reference Date: [DATE]

The patient tolerated the procedure well and was released from the
[REDACTED]. She was given instructions regarding seed removal.
IMPRESSION: Radioactive seed localization the left breast. No apparent
complications. The radioactive seed is located approximately 2.3 cm
anterior and inferior to the ribbon shaped clip. Findings were
discussed with Dr. MORDY.

## 2021-02-26 IMAGING — MR MR BREAST BX W LOC DEV 1ST LESION IMAGE BX SPEC MR GUIDE*L*
5 of 6 series · 36 of 48 positions shown · IV contrast (agent unspecified)
Comparison: Previous exams.

CLINICAL DATA: MR guided clip placement. Patient has a known
invasive ductal carcinoma far posteriorly in the 7 o'clock region of
the left breast. At the time of biopsy a ribbon shaped clip was
placed. The patient is status post neoadjuvant chemotherapy.
Localization could not be performed mammographically or
sonographically. A Q shaped clip will be placed anterior to the
known area of cancer (ribbon shaped clip) for localization purposes.

EXAM:
MRI GUIDED CORE NEEDLE BIOPSY OF THE LEFT BREAST
TECHNIQUE: Nonfat sat multiplanar, multisequence MR imaging of the the left
breast was performed.
CONTRAST:  None

[Series 2: fiducial unilateral · sagittal · 2.0mm · 1.33mm/px · 3 of 56 slices shown]
[im 1/56]
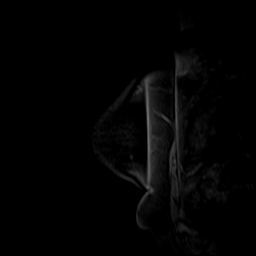
[im 28/56]
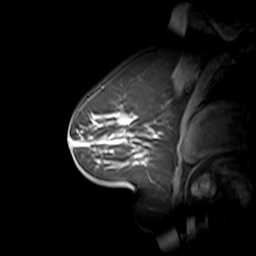
[im 56/56]
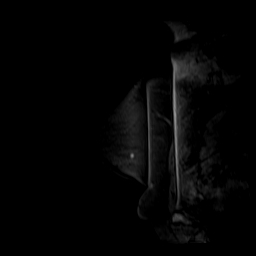

[Series 3: non fat sat · axial · 1.3mm · 0.73mm/px · z∈[-79,+107]mm · 9 of 144 slices shown (1 of 4)]
[im 1/144]
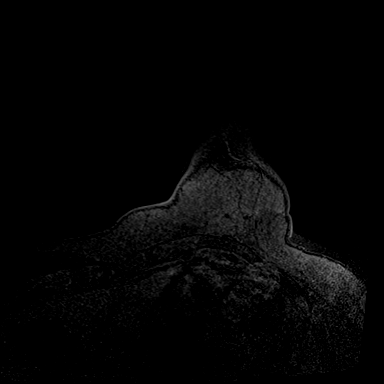
[im 18/144]
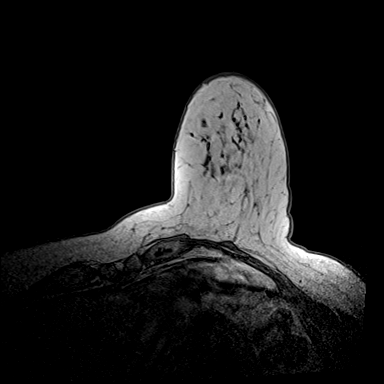
[im 36/144]
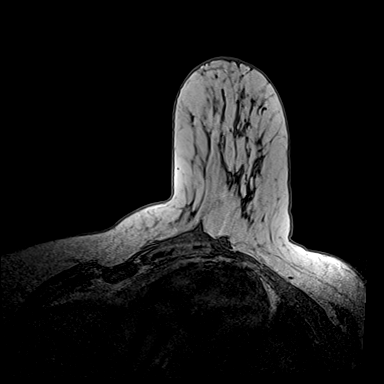
[im 54/144]
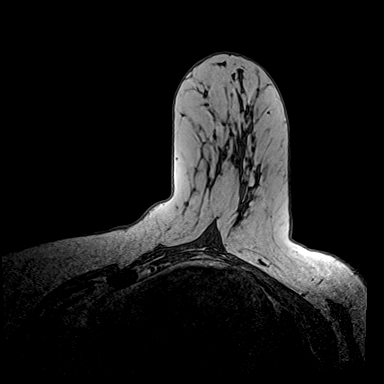
[im 72/144]
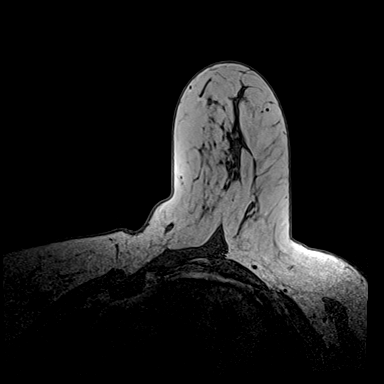
[im 90/144]
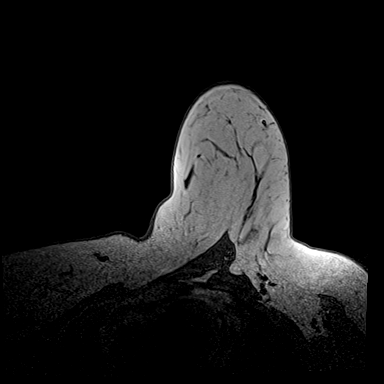
[im 108/144]
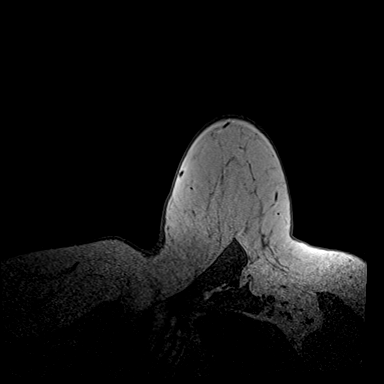
[im 126/144]
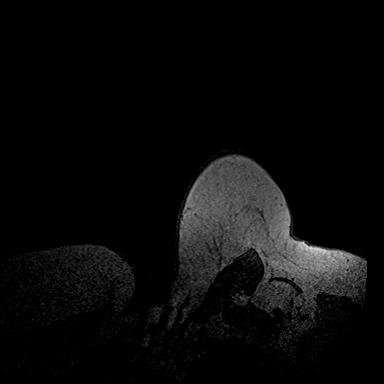
[im 144/144]
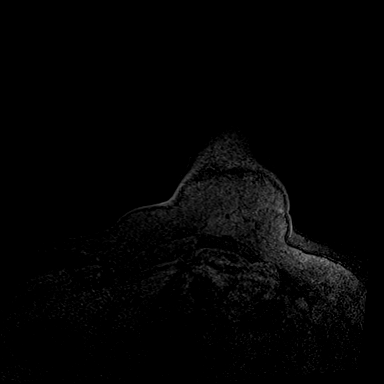

[Series 4: non fat sat · axial · 1.3mm · 0.73mm/px · z∈[-79,+107]mm · 9 of 144 slices shown (2 of 4)]
[im 1/144]
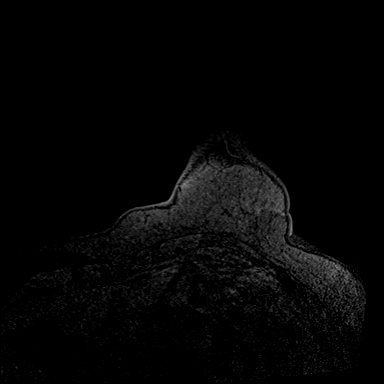
[im 18/144]
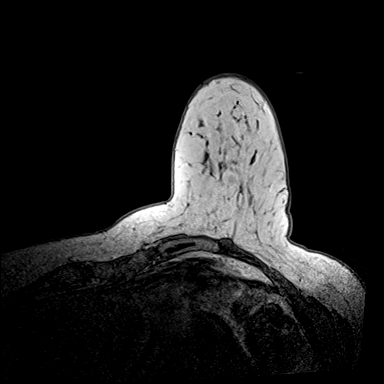
[im 36/144]
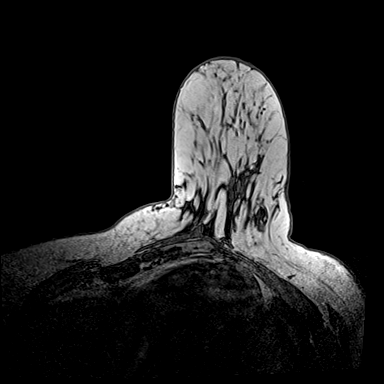
[im 54/144]
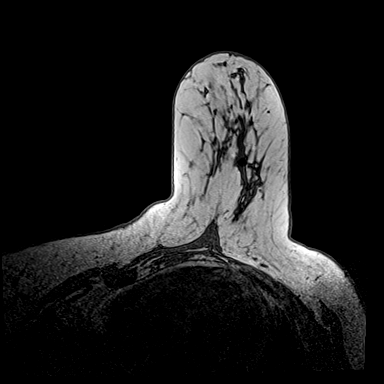
[im 72/144]
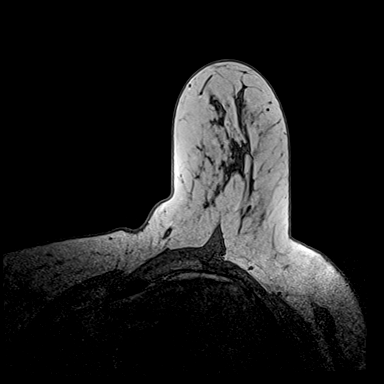
[im 90/144]
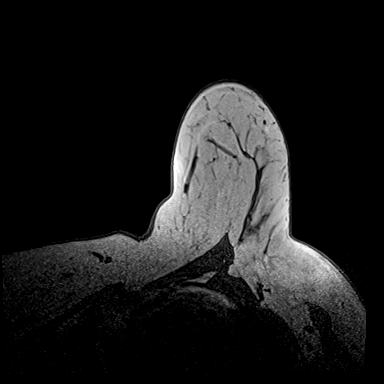
[im 108/144]
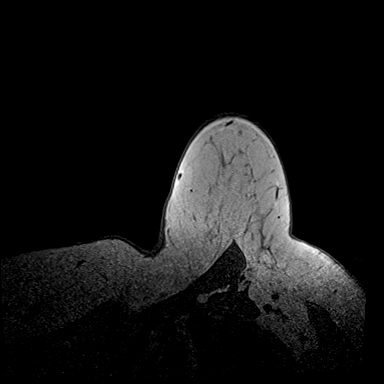
[im 126/144]
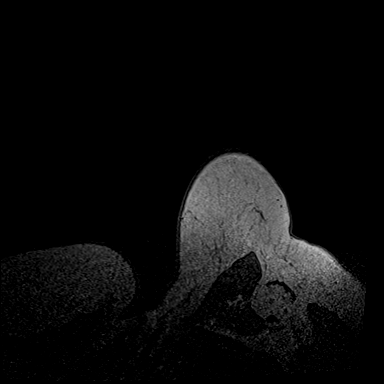
[im 144/144]
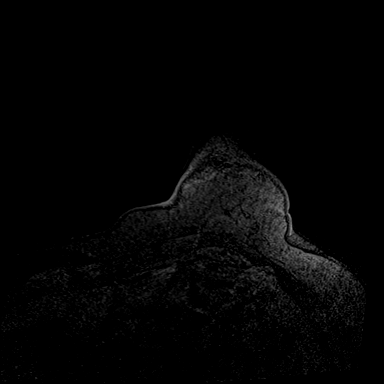

[Series 5: non fat sat · axial · 1.3mm · 0.73mm/px · z∈[-79,+107]mm · 9 of 144 slices shown (3 of 4)]
[im 1/144]
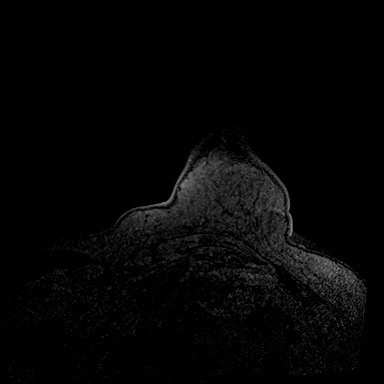
[im 18/144]
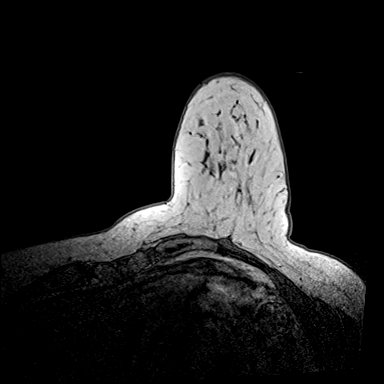
[im 36/144]
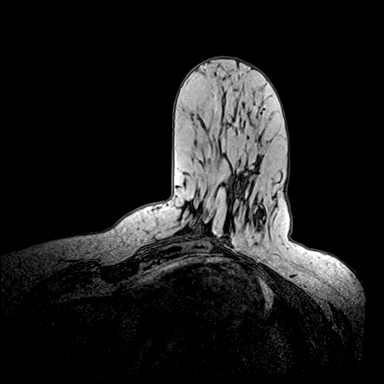
[im 54/144]
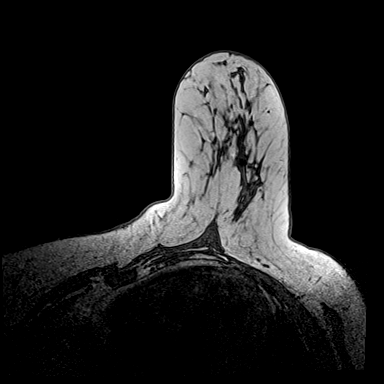
[im 72/144]
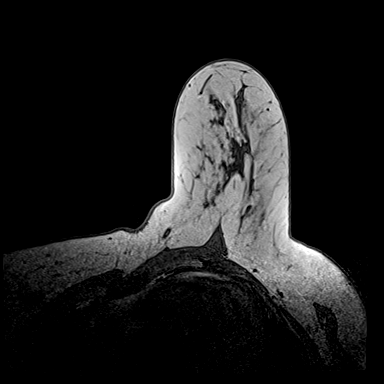
[im 90/144]
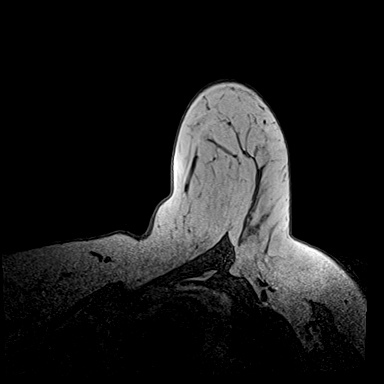
[im 108/144]
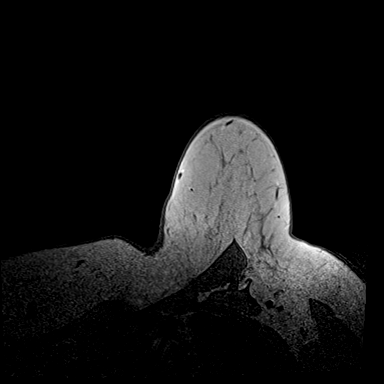
[im 126/144]
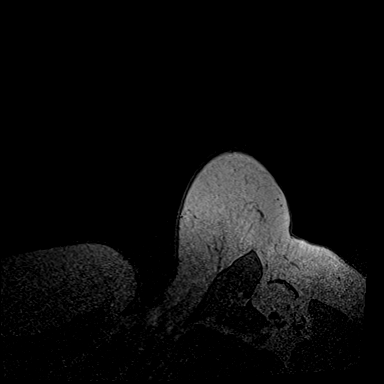
[im 144/144]
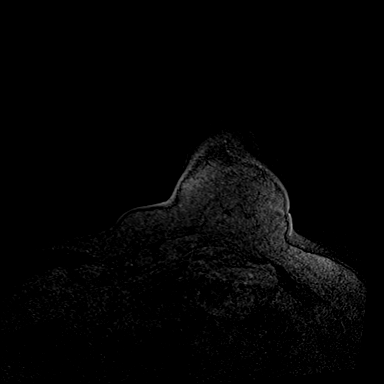

[Series 6: non fat sat · axial · 1.3mm · 0.73mm/px · z∈[-79,+60]mm · 6 of 144 slices shown (4 of 4)]
[im 1/144]
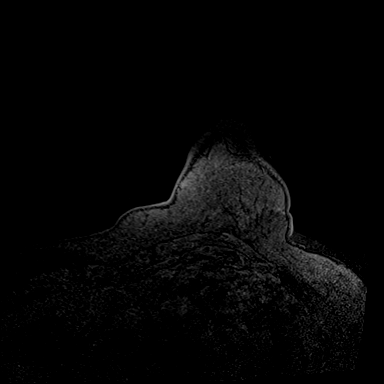
[im 18/144]
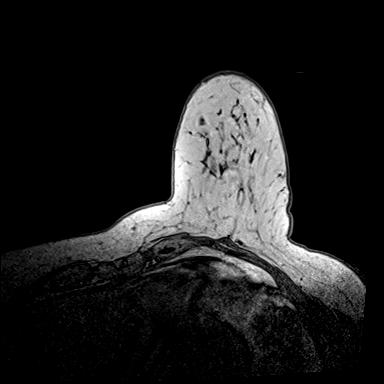
[im 36/144]
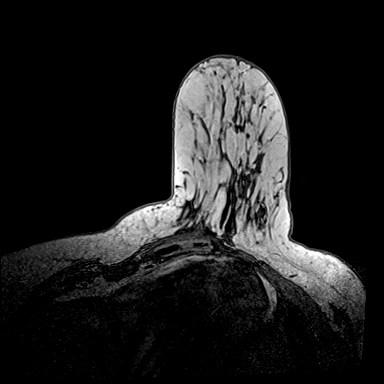
[im 54/144]
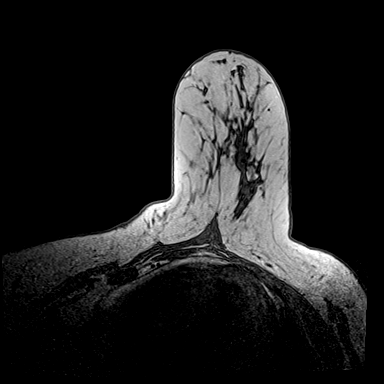
[im 90/144]
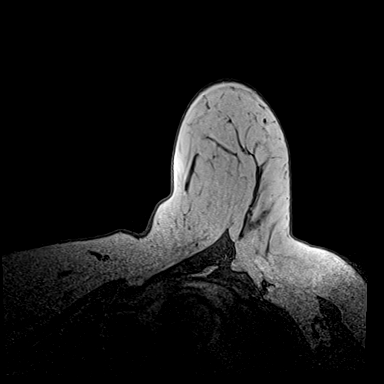
[im 108/144]
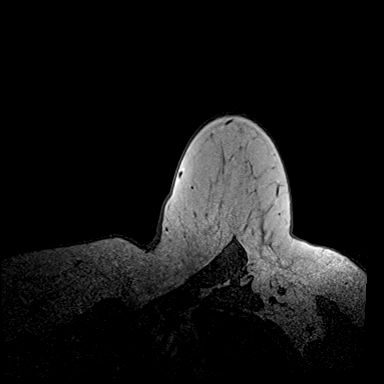

[36 of 48 positions shown; findings below may reference images not displayed]

FINDINGS: Using MR guidance and a lateral approach a Q shaped clip was placed
anterior to the signal void artifact from the ribbon shaped clip
located far posteriorly in the left breast. Patient will go to the
[REDACTED] for clip films and radioactive seed placement.
IMPRESSION: MRI guided left breast clip placement.  No apparent complications.

## 2021-02-26 SURGERY — BREAST LUMPECTOMY WITH RADIOACTIVE SEED AND SENTINEL LYMPH NODE BIOPSY
Anesthesia: General | Site: Breast | Laterality: Left

## 2021-02-26 MED ORDER — LACTATED RINGERS IV SOLN: INTRAVENOUS | Status: DC

## 2021-02-26 MED ORDER — FENTANYL CITRATE (PF) 100 MCG/2ML IJ SOLN
INTRAMUSCULAR | Status: AC
  Filled 2021-02-26: qty 2

## 2021-02-26 MED ORDER — PROMETHAZINE HCL 25 MG/ML IJ SOLN: 6.2500 mg | INTRAMUSCULAR | Status: DC | PRN

## 2021-02-26 MED ORDER — ONDANSETRON HCL 4 MG/2ML IJ SOLN
INTRAMUSCULAR | Status: AC
  Filled 2021-02-26: qty 2

## 2021-02-26 MED ORDER — MIDAZOLAM HCL 2 MG/2ML IJ SOLN
INTRAMUSCULAR | Status: AC
  Filled 2021-02-26: qty 2

## 2021-02-26 MED ORDER — SODIUM CHLORIDE (PF) 0.9 % IJ SOLN
INTRAVENOUS | Status: DC | PRN
  Administered 2021-02-26: 4 mL

## 2021-02-26 MED ORDER — BUPIVACAINE HCL (PF) 0.25 % IJ SOLN
INTRAMUSCULAR | Status: DC | PRN
  Administered 2021-02-26: 6 mL

## 2021-02-26 MED ORDER — PROPOFOL 10 MG/ML IV BOLUS
INTRAVENOUS | Status: AC
  Filled 2021-02-26: qty 20

## 2021-02-26 MED ORDER — PROPOFOL 10 MG/ML IV BOLUS
INTRAVENOUS | Status: DC | PRN
  Administered 2021-02-26: 200 mg via INTRAVENOUS

## 2021-02-26 MED ORDER — LIDOCAINE HCL (CARDIAC) PF 100 MG/5ML IV SOSY
PREFILLED_SYRINGE | INTRAVENOUS | Status: DC | PRN
  Administered 2021-02-26: 60 mg via INTRATRACHEAL

## 2021-02-26 MED ORDER — KETOROLAC TROMETHAMINE 30 MG/ML IJ SOLN
15.0000 mg | Freq: Once | INTRAMUSCULAR | Status: AC
  Administered 2021-02-26: 15 mg via INTRAVENOUS

## 2021-02-26 MED ORDER — FENTANYL CITRATE (PF) 100 MCG/2ML IJ SOLN
100.0000 ug | Freq: Once | INTRAMUSCULAR | Status: DC
Start: 2021-02-26 — End: 2021-02-26

## 2021-02-26 MED ORDER — FENTANYL CITRATE (PF) 100 MCG/2ML IJ SOLN
INTRAMUSCULAR | Status: DC | PRN
  Administered 2021-02-26 (×2): 25 ug via INTRAVENOUS
  Administered 2021-02-26: 50 ug via INTRAVENOUS

## 2021-02-26 MED ORDER — CEFAZOLIN SODIUM-DEXTROSE 2-4 GM/100ML-% IV SOLN
INTRAVENOUS | Status: AC
  Filled 2021-02-26: qty 100

## 2021-02-26 MED ORDER — TECHNETIUM TC 99M TILMANOCEPT KIT
1.0000 | PACK | Freq: Once | INTRAVENOUS | Status: AC | PRN
  Administered 2021-02-26: 1 via INTRADERMAL

## 2021-02-26 MED ORDER — OXYCODONE HCL 5 MG PO TABS
5.0000 mg | ORAL_TABLET | Freq: Once | ORAL | Status: AC
Start: 2021-02-26 — End: 2021-02-26
  Administered 2021-02-26: 5 mg via ORAL

## 2021-02-26 MED ORDER — CEFAZOLIN SODIUM-DEXTROSE 2-4 GM/100ML-% IV SOLN
2.0000 g | Freq: Once | INTRAVENOUS | Status: AC
  Administered 2021-02-26: 2 g via INTRAVENOUS

## 2021-02-26 MED ORDER — FENTANYL CITRATE (PF) 100 MCG/2ML IJ SOLN: 25.0000 ug | INTRAMUSCULAR | Status: DC | PRN

## 2021-02-26 MED ORDER — MIDAZOLAM HCL 2 MG/2ML IJ SOLN: 2.0000 mg | Freq: Once | INTRAMUSCULAR | Status: DC

## 2021-02-26 MED ORDER — EPHEDRINE SULFATE 50 MG/ML IJ SOLN
INTRAMUSCULAR | Status: DC | PRN
  Administered 2021-02-26 (×2): 10 mg via INTRAVENOUS

## 2021-02-26 MED ORDER — DEXAMETHASONE SODIUM PHOSPHATE 10 MG/ML IJ SOLN
INTRAMUSCULAR | Status: DC | PRN
  Administered 2021-02-26: 5 mg via INTRAVENOUS

## 2021-02-26 MED ORDER — BUPIVACAINE LIPOSOME 1.3 % IJ SUSP
INTRAMUSCULAR | Status: DC | PRN
  Administered 2021-02-26: 10 mL

## 2021-02-26 MED ORDER — ACETAMINOPHEN 500 MG PO TABS
ORAL_TABLET | ORAL | Status: AC
  Filled 2021-02-26: qty 2

## 2021-02-26 MED ORDER — ONDANSETRON HCL 4 MG/2ML IJ SOLN
INTRAMUSCULAR | Status: DC | PRN
  Administered 2021-02-26: 4 mg via INTRAVENOUS

## 2021-02-26 MED ORDER — BUPIVACAINE HCL (PF) 0.5 % IJ SOLN
INTRAMUSCULAR | Status: DC | PRN
  Administered 2021-02-26: 15 mL

## 2021-02-26 MED ORDER — KETOROLAC TROMETHAMINE 15 MG/ML IJ SOLN
INTRAMUSCULAR | Status: AC
  Filled 2021-02-26: qty 1

## 2021-02-26 MED ORDER — MIDAZOLAM HCL 5 MG/5ML IJ SOLN
INTRAMUSCULAR | Status: DC | PRN
  Administered 2021-02-26: 2 mg via INTRAVENOUS

## 2021-02-26 MED ORDER — OXYCODONE HCL 5 MG PO TABS
ORAL_TABLET | ORAL | Status: AC
  Filled 2021-02-26: qty 1

## 2021-02-26 MED ORDER — TRAMADOL HCL 50 MG PO TABS: 100.0000 mg | ORAL_TABLET | Freq: Four times a day (QID) | ORAL | 0 refills | Status: DC | PRN

## 2021-02-26 MED ORDER — EPHEDRINE 5 MG/ML INJ
INTRAVENOUS | Status: AC
  Filled 2021-02-26: qty 10

## 2021-02-26 MED ORDER — ACETAMINOPHEN 500 MG PO TABS
1000.0000 mg | ORAL_TABLET | Freq: Once | ORAL | Status: AC
  Administered 2021-02-26: 1000 mg via ORAL

## 2021-02-26 SURGICAL SUPPLY — 58 items
ADH SKN CLS APL DERMABOND .7 (GAUZE/BANDAGES/DRESSINGS) ×2
APL PRP STRL LF DISP 70% ISPRP (MISCELLANEOUS) ×1
APPLIER CLIP 9.375 MED OPEN (MISCELLANEOUS) ×2
APR CLP MED 9.3 20 MLT OPN (MISCELLANEOUS) ×1
BINDER BREAST LRG (GAUZE/BANDAGES/DRESSINGS) IMPLANT
BINDER BREAST MEDIUM (GAUZE/BANDAGES/DRESSINGS) IMPLANT
BINDER BREAST XLRG (GAUZE/BANDAGES/DRESSINGS) ×2 IMPLANT
BINDER BREAST XXLRG (GAUZE/BANDAGES/DRESSINGS) IMPLANT
BLADE SURG 15 STRL LF DISP TIS (BLADE) ×1 IMPLANT
BLADE SURG 15 STRL SS (BLADE) ×2
CANISTER SUC SOCK COL 7IN (MISCELLANEOUS) IMPLANT
CANISTER SUCT 1200ML W/VALVE (MISCELLANEOUS) IMPLANT
CHLORAPREP W/TINT 26 (MISCELLANEOUS) ×2 IMPLANT
CLIP APPLIE 9.375 MED OPEN (MISCELLANEOUS) ×1 IMPLANT
CLIP TI WIDE RED SMALL 6 (CLIP) ×2 IMPLANT
COVER BACK TABLE 60X90IN (DRAPES) ×2 IMPLANT
COVER MAYO STAND STRL (DRAPES) ×2 IMPLANT
COVER PROBE W GEL 5X96 (DRAPES) ×2 IMPLANT
DECANTER SPIKE VIAL GLASS SM (MISCELLANEOUS) IMPLANT
DERMABOND ADVANCED (GAUZE/BANDAGES/DRESSINGS) ×2
DERMABOND ADVANCED .7 DNX12 (GAUZE/BANDAGES/DRESSINGS) ×2 IMPLANT
DRAPE LAPAROSCOPIC ABDOMINAL (DRAPES) ×2 IMPLANT
DRAPE UTILITY XL STRL (DRAPES) ×2 IMPLANT
ELECT COATED BLADE 2.86 ST (ELECTRODE) ×2 IMPLANT
ELECT REM PT RETURN 9FT ADLT (ELECTROSURGICAL) ×2
ELECTRODE REM PT RTRN 9FT ADLT (ELECTROSURGICAL) ×1 IMPLANT
GLOVE SURG ENC MOIS LTX SZ7 (GLOVE) ×4 IMPLANT
GLOVE SURG UNDER POLY LF SZ6.5 (GLOVE) ×2 IMPLANT
GLOVE SURG UNDER POLY LF SZ7 (GLOVE) ×4 IMPLANT
GLOVE SURG UNDER POLY LF SZ7.5 (GLOVE) ×2 IMPLANT
GOWN STRL REUS W/ TWL LRG LVL3 (GOWN DISPOSABLE) ×3 IMPLANT
GOWN STRL REUS W/TWL LRG LVL3 (GOWN DISPOSABLE) ×6
HEMOSTAT ARISTA ABSORB 3G PWDR (HEMOSTASIS) IMPLANT
KIT MARKER MARGIN INK (KITS) ×2 IMPLANT
NDL SAFETY ECLIPSE 18X1.5 (NEEDLE) IMPLANT
NEEDLE HYPO 18GX1.5 SHARP (NEEDLE)
NEEDLE HYPO 25X1 1.5 SAFETY (NEEDLE) ×4 IMPLANT
NS IRRIG 1000ML POUR BTL (IV SOLUTION) ×2 IMPLANT
PACK BASIN DAY SURGERY FS (CUSTOM PROCEDURE TRAY) ×2 IMPLANT
PENCIL SMOKE EVACUATOR (MISCELLANEOUS) ×2 IMPLANT
RETRACTOR ONETRAX LX 90X20 (MISCELLANEOUS) ×2 IMPLANT
SLEEVE SCD COMPRESS KNEE MED (STOCKING) ×2 IMPLANT
SPONGE T-LAP 4X18 ~~LOC~~+RFID (SPONGE) ×4 IMPLANT
STRIP CLOSURE SKIN 1/2X4 (GAUZE/BANDAGES/DRESSINGS) ×2 IMPLANT
SUT ETHILON 2 0 FS 18 (SUTURE) IMPLANT
SUT MNCRL AB 4-0 PS2 18 (SUTURE) ×6 IMPLANT
SUT MON AB 5-0 PS2 18 (SUTURE) IMPLANT
SUT SILK 2 0 SH (SUTURE) IMPLANT
SUT VIC AB 2-0 SH 27 (SUTURE) ×6
SUT VIC AB 2-0 SH 27XBRD (SUTURE) ×3 IMPLANT
SUT VIC AB 3-0 SH 27 (SUTURE) ×8
SUT VIC AB 3-0 SH 27X BRD (SUTURE) ×4 IMPLANT
SUT VIC AB 5-0 PS2 18 (SUTURE) IMPLANT
SYR CONTROL 10ML LL (SYRINGE) ×4 IMPLANT
TOWEL GREEN STERILE FF (TOWEL DISPOSABLE) ×2 IMPLANT
TRAY FAXITRON CT DISP (TRAY / TRAY PROCEDURE) ×4 IMPLANT
TUBE CONNECTING 20X1/4 (TUBING) ×2 IMPLANT
YANKAUER SUCT BULB TIP NO VENT (SUCTIONS) ×2 IMPLANT

## 2021-02-26 NOTE — Anesthesia Postprocedure Evaluation (Signed)
Anesthesia Post Note  Patient: Tracy Dyer  Procedure(s) Performed: LEFT BREAST LUMPECTOMY WITH RADIOACTIVE SEED x2; LEFT AXILLARY SENTINEL NODE BIOPSY (Left: Breast)     Patient location during evaluation: PACU Anesthesia Type: General Level of consciousness: awake and alert Pain management: pain level controlled Vital Signs Assessment: post-procedure vital signs reviewed and stable Respiratory status: spontaneous breathing, nonlabored ventilation and respiratory function stable Cardiovascular status: blood pressure returned to baseline and stable Postop Assessment: no apparent nausea or vomiting Anesthetic complications: no   No notable events documented.  Last Vitals:  Vitals:   02/26/21 1630 02/26/21 1645  BP: 113/66 114/67  Pulse: (!) 58 (!) 55  Resp: 15 15  Temp:    SpO2: 100% 99%    Last Pain:  Vitals:   02/26/21 1645  TempSrc:   PainSc: 0-No pain                 Audry Pili

## 2021-02-26 NOTE — Op Note (Signed)
Preoperative diagnosis: Clinical stage II left breast cancer s/p primary chemotherapy Postoperative diagnosis: Same as above Procedure:  1. Left breast radioactive seed lumpectomy times two 2. Left deep axillary sentinel lymph node biopsy 3. Injection of blue dye for sentinel node identification Surgeon: Dr. Serita Grammes Asst: Otho Bellows Anesthesia: General with a pectoral block Estimated blood loss: Minimal Complications: None Drains: None Specimens: 1.  Left breast tissue (medially) marked with paint containing seed and q clip 2.  Additional superior margin marked short superior, long lateral and double deep 3. Left lateral lumpectomy marked with paint containing two clips and seed 4. Left axillary sentinel nodes with highest count of 1064 Sponge needle count was correct completion Disposition recovery stable condition   Indications: 29 yof who is otherwise healthy presents with new left breast cancer. she was getting f/u for calcs. she has prior benign biopsies.  she palpated a left breast mass.  mm showed irregular mass and c density breasts.  US shows a 3 oclock mass measuring 1.8x1.5x1.3 cm and another near that that measures 10x5x5 mm.  there are two enlarged axillary nodes present.  biopsy of the two breast masses and a node done.  clips 2.5 cm apart.  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  biopsy of the second breast lesion if FA with UDH this is concordant. she had negative genetics.  she also had mri that showed 4 abnl nodes, 2.5 cm mass in uoq and another small one as well.  this second one was biopsied and is also IDC. In summary she had 1.  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  left ax node positive (tribell clip) 3.  IDC with LVI 7 oclock 7 cm from nipple (ribbon)   she has undergone primary chemotherapy which she has tolerated fairly well.  she just finished last  week.  she has seen plastic surgery and is very interested in nn preserving nsm.  she has repeat mri which really shows complete radiologic response in breast and nodes. She has elected to undergo double lumpectomy with TAD and then reduction.    Procedure: After informed consent was obtained the patient first underwent a pectoral block. She was given antibiotics.  SCDs were in place.  I had her mammograms in the operating room.  She was placed under general anesthesia without complication.  She was prepped and draped in the standard sterile surgical fashion.  Surgical timeout was performed..    I infiltrated a methylene blue/saline mixture in periareolar position and massaged this.  She had been marked by Dr Iran Planas.  I did the medial lesion first.  The ribbon clip was 2.5 cm posterior and superior to the q clip and the seed. This was where the tumor was.  I made an incision on the line of the reduction pattern. I used the neoprobe to remove the seed and the surrounding tissue with the plan to include the ,lesion 2.5 cm away. I took this down to the chest wall.  The first specimen was marked with paint and had the q clip and seed in it. I took some superior margin off the muscle and this did not contain the ribbon clip. I did see biopsy cavity with this.  I dont think any  more to do to find the clip and I felt like I had removed the appropriate area.  Will have to wait for pathology.  I then obtained hemostasis.  This was later closed with 2-0 vicryl, 3-0 vicryl and 4-0 monocryl. Glue was placed.  I then did the lateral lesion.  Infiltrated Marcaine and made incision overlying this.  I dissected down to the seed.  I then used the neoprobe to guide the removal of the seed with an attempt to get a rim of normal tissue around the lesions.  Mammogram after painting the specimen showed that I removed 2 clips and a seed.  I had obtained hemostasis.  I closed this with 2-0 Vicryl, 3-0 Vicryl, 4 Monocryl.  Glue  and Steri-Strips were later applied.  I then located the sentinel node in the low axilla.  I have made an incision underneath the hairline and entered through the axillary fascia.  There were several nodes that were noted to be radioactive.  There was no blue dye present.  I remove these nodes with the highest count as listed above.  There was some diffuse background radioactivity but none of this appeared to be an additional sentinel node.  I then obtained hemostasis.  This was closed with 2-0 Vicryl, 3-0 Vicryl, 4 Monocryl.  Glue and Steri-Strips were applied.  She tolerated this well was extubated and transferred to recovery stable.   I was personally present during the key and critical portions of this procedure and immediately available throughout the entire procedure, as documented in my operative note.

## 2021-02-26 NOTE — Progress Notes (Signed)
Nuc med inj performed by nuc med staff. Pt tol well with no additional sedation required. VSS. Emotional support provided

## 2021-02-26 NOTE — Progress Notes (Signed)
Assisted Dr. Fransisco Beau with left, ultrasound guided, pectoralis block. Side rails up, monitors on throughout procedure. See vital signs in flow sheet. Tolerated Procedure well.

## 2021-02-26 NOTE — Anesthesia Procedure Notes (Signed)
Procedure Name: LMA Insertion Date/Time: 02/26/2021 2:34 PM Performed by: Glory Buff, CRNA Pre-anesthesia Checklist: Patient identified, Emergency Drugs available, Suction available and Patient being monitored Patient Re-evaluated:Patient Re-evaluated prior to induction Oxygen Delivery Method: Circle system utilized Preoxygenation: Pre-oxygenation with 100% oxygen Induction Type: IV induction LMA: LMA inserted LMA Size: 4.0 Number of attempts: 1 Placement Confirmation: positive ETCO2 Tube secured with: Tape Dental Injury: Teeth and Oropharynx as per pre-operative assessment

## 2021-02-26 NOTE — Anesthesia Preprocedure Evaluation (Addendum)
Anesthesia Evaluation  Patient identified by MRN, date of birth, ID band Patient awake    Reviewed: Allergy & Precautions, NPO status , Patient's Chart, lab work & pertinent test results  History of Anesthesia Complications Negative for: history of anesthetic complications  Airway Mallampati: II  TM Distance: >3 FB Neck ROM: Full    Dental  (+) Dental Advisory Given   Pulmonary neg pulmonary ROS,    Pulmonary exam normal        Cardiovascular negative cardio ROS Normal cardiovascular exam     Neuro/Psych PSYCHIATRIC DISORDERS Anxiety Depression TIA   GI/Hepatic Neg liver ROS, GERD  Medicated and Controlled,  Endo/Other   Obesity   Renal/GU negative Renal ROS     Musculoskeletal negative musculoskeletal ROS (+)   Abdominal   Peds  Hematology  Factor V Leiden mutation    Anesthesia Other Findings   Reproductive/Obstetrics  Breast cancer                             Anesthesia Physical Anesthesia Plan  ASA: 2  Anesthesia Plan: General   Post-op Pain Management:  Regional for Post-op pain   Induction: Intravenous  PONV Risk Score and Plan: 3 and Treatment may vary due to age or medical condition, Ondansetron, Dexamethasone and Midazolam  Airway Management Planned: LMA  Additional Equipment: None  Intra-op Plan:   Post-operative Plan: Extubation in OR  Informed Consent: I have reviewed the patients History and Physical, chart, labs and discussed the procedure including the risks, benefits and alternatives for the proposed anesthesia with the patient or authorized representative who has indicated his/her understanding and acceptance.     Dental advisory given  Plan Discussed with: CRNA and Anesthesiologist  Anesthesia Plan Comments:        Anesthesia Quick Evaluation

## 2021-02-26 NOTE — Interval H&P Note (Signed)
History and Physical Interval Note:  02/26/2021 2:17 PM  Tracy Dyer  has presented today for surgery, with the diagnosis of BREAST CANCER.  The various methods of treatment have been discussed with the patient and family. After consideration of risks, benefits and other options for treatment, the patient has consented to  Procedure(s): LEFT BREAST LUMPECTOMY WITH RADIOACTIVE SEED x2; LEFT AXILLARY SENTINEL NODE BIOPSY (Left) as a surgical intervention.  The patient's history has been reviewed, patient examined, no change in status, stable for surgery.  I have reviewed the patient's chart and labs.  Questions were answered to the patient's satisfaction.     [name]

## 2021-02-26 NOTE — Discharge Instructions (Addendum)
[facility] Office Phone Number [phone]  BREAST BIOPSY/ PARTIAL MASTECTOMY: POST OP INSTRUCTIONS Take 400 mg of ibuprofen every 8 hours or 650 mg tylenol every 6 hours for next 72 hours then as needed. Use ice several times daily also. Always review your discharge instruction sheet given to you by the facility where your surgery was performed.  IF YOU HAVE DISABILITY OR FAMILY LEAVE FORMS, YOU MUST BRING THEM TO THE OFFICE FOR PROCESSING.  DO NOT GIVE THEM TO YOUR DOCTOR.  A prescription for pain medication may be given to you upon discharge.  Take your pain medication as prescribed, if needed.  If narcotic pain medicine is not needed, then you may take acetaminophen (Tylenol), naprosyn (Alleve) or ibuprofen (Advil) as needed. Take your usually prescribed medications unless otherwise directed If you need a refill on your pain medication, please contact your pharmacy.  They will contact our office to request authorization.  Prescriptions will not be filled after 5pm or on week-ends. You should eat very light the first 24 hours after surgery, such as soup, crackers, pudding, etc.  Resume your normal diet the day after surgery. Most patients will experience some swelling and bruising in the breast.  Ice packs and a good support bra will help.  Wear the breast binder provided or a sports bra for 72 hours day and night.  After that wear a sports bra during the day until you return to the office. Swelling and bruising can take several days to resolve.  It is common to experience some constipation if taking pain medication after surgery.  Increasing fluid intake and taking a stool softener will usually help or prevent this problem from occurring.  A mild laxative (Milk of Magnesia or Miralax) should be taken according to package directions if there are no bowel movements after 48 hours. Unless discharge instructions indicate otherwise, you may remove your bandages 48 hours after surgery  and you may shower at that time.  You may have steri-strips (small skin tapes) in place directly over the incision.  These strips should be left on the skin for 7-10 days and will come off on their own.  If your surgeon used skin glue on the incision, you may shower in 24 hours.  The glue will flake off over the next 2-3 weeks.  Any sutures or staples will be removed at the office during your follow-up visit. ACTIVITIES:  You may resume regular daily activities (gradually increasing) beginning the next day.  Wearing a good support bra or sports bra minimizes pain and swelling.  You may have sexual intercourse when it is comfortable. You may drive when you no longer are taking prescription pain medication, you can comfortably wear a seatbelt, and you can safely maneuver your car and apply brakes. RETURN TO WORK:  ______________________________________________________________________________________ You should see your doctor in the office for a follow-up appointment approximately two weeks after your surgery.  Your doctor's nurse will typically make your follow-up appointment when she calls you with your pathology report.  Expect your pathology report 3-4 business days after your surgery.  You may call to check if you do not hear from us after three days. OTHER INSTRUCTIONS: _______________________________________________________________________________________________ _____________________________________________________________________________________________________________________________________ _____________________________________________________________________________________________________________________________________ _____________________________________________________________________________________________________________________________________  WHEN TO CALL DR WAKEFIELD: Fever over 101.0 Nausea and/or vomiting. Extreme swelling or bruising. Continued bleeding from incision. Increased  pain, redness, or drainage from the incision.  The clinic staff is available to answer your questions during regular business hours.  Please don't hesitate to call   and ask to speak to one of the nurses for clinical concerns.  If you have a medical emergency, go to the nearest emergency room or call 911.  A surgeon from [facility] is always on call at the hospital.  For further questions, please visit [email or web address]  No tylenol until after 7:30 pm today. No ibuprofen/motrin until after 9:45 pm today.  Post Anesthesia Home Care Instructions  Activity: Get plenty of rest for the remainder of the day. A responsible individual must stay with you for 24 hours following the procedure.  For the next 24 hours, DO NOT: -Drive a car -Paediatric nurse -Drink alcoholic beverages -Take any medication unless instructed by your physician -Make any legal decisions or sign important papers.  Meals: Start with liquid foods such as gelatin or soup. Progress to regular foods as tolerated. Avoid greasy, spicy, heavy foods. If nausea and/or vomiting occur, drink only clear liquids until the nausea and/or vomiting subsides. Call your physician if vomiting continues.  Special Instructions/Symptoms: Your throat may feel dry or sore from the anesthesia or the breathing tube placed in your throat during surgery. If this causes discomfort, gargle with warm salt water. The discomfort should disappear within 24 hours.  If you had a scopolamine patch placed behind your ear for the management of post- operative nausea and/or vomiting:  1. The medication in the patch is effective for 72 hours, after which it should be removed.  Wrap patch in a tissue and discard in the trash. Wash hands thoroughly with soap and water. 2. You may remove the patch earlier than 72 hours if you experience unpleasant side effects which may include dry mouth, dizziness or visual disturbances. 3. Avoid touching  the patch. Wash your hands with soap and water after contact with the patch.    Regional Anesthesia Blocks  1. Numbness or the inability to move the "blocked" extremity may last from 3-48 hours after placement. The length of time depends on the medication injected and your individual response to the medication. If the numbness is not going away after 48 hours, call your surgeon.  2. The extremity that is blocked will need to be protected until the numbness is gone and the  Strength has returned. Because you cannot feel it, you will need to take extra care to avoid injury. Because it may be weak, you may have difficulty moving it or using it. You may not know what position it is in without looking at it while the block is in effect.  3. For blocks in the legs and feet, returning to weight bearing and walking needs to be done carefully. You will need to wait until the numbness is entirely gone and the strength has returned. You should be able to move your leg and foot normally before you try and bear weight or walk. You will need someone to be with you when you first try to ensure you do not fall and possibly risk injury.  4. Bruising and tenderness at the needle site are common side effects and will resolve in a few days.  5. Persistent numbness or new problems with movement should be communicated to the surgeon or the [facility] [phone] [place] [phone]). Information for Discharge Teaching: EXPAREL (bupivacaine liposome injectable suspension)   Your surgeon or anesthesiologist gave you EXPAREL(bupivacaine) to help control your pain after surgery.  EXPAREL is a local anesthetic that provides pain relief by numbing the tissue around the surgical site.  EXPAREL is designed to release pain medication over time and can control pain for up to 72 hours. Depending on how you respond to EXPAREL, you may require less pain medication during your recovery.  Possible  side effects: Temporary loss of sensation or ability to move in the area where bupivacaine was injected. Nausea, vomiting, constipation Rarely, numbness and tingling in your mouth or lips, lightheadedness, or anxiety may occur. Call your doctor right away if you think you may be experiencing any of these sensations, or if you have other questions regarding possible side effects.  Follow all other discharge instructions given to you by your surgeon or nurse. Eat a healthy diet and drink plenty of water or other fluids.  If you return to the hospital for any reason within 96 hours following the administration of EXPAREL, it is important for health care providers to know that you have received this anesthetic. A teal colored band has been placed on your arm with the date, time and amount of EXPAREL you have received in order to alert and inform your health care providers. Please leave this armband in place for the full 96 hours following administration, and then you may remove the band.

## 2021-02-26 NOTE — H&P (Signed)
Tracy Dyer is an 50 y.o. female.   Chief Complaint: breast cancer HPI: 22 yof who is otherwise healthy presents with new left breast cancer. she was getting f/u for calcs. she has prior benign biopsies.  she palpated a left breast mass.  mm showed irregular mass and c density breasts.  US shows a 3 oclock mass measuring 1.8x1.5x1.3 cm and another near that that measures 10x5x5 mm.  there are two enlarged axillary nodes present.  biopsy of the two breast masses and a node done.  clips 2.5 cm apart.  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  biopsy of the second breast lesion if FA with [facility] this is concordant. she had negative genetics.  she also had mri that showed 4 abnl nodes, 2.5 cm mass in uoq and another small one as well.  this second one was biopsied and is also IDC. In summary she had 1.  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  left ax node positive (tribell clip) 3.  IDC with LVI 7 oclock 7 cm from nipple (ribbon)  she has undergone primary chemotherapy which she has tolerated fairly well.  she just finished last week.  she has seen plastic surgery and is very interested in nn preserving nsm.  she has repeat mri which really shows complete radiologic response in breast and nodes. She has elected to undergo double lumpectomy with TAD and then reduction. She ultimately wants to do above but we have elected to proceed this way now.    Past Medical History:  Diagnosis Date   Anxiety    Cancer [facility])    breast   Depression    Family history of breast cancer    Family history of lung cancer    Family history of ovarian cancer    GERD (gastroesophageal reflux disease)    diet controlled   Seasonal allergies     Past Surgical History:  Procedure Laterality Date   BREAST BIOPSY Left 2019   x 2 biopsy   BREAST EXCISIONAL BIOPSY Left    CHOLECYSTECTOMY  2008   PORTACATH PLACEMENT N/A  09/05/2020   Procedure: INSERTION PORT-A-CATH;  Surgeon: [name], MD;  Location: [place];  Service: General;  Laterality: N/A;  PEC BLOCK   WISDOM TOOTH EXTRACTION      Family History  Problem Relation Age of Onset   Eczema Father    Allergic rhinitis Brother    Breast cancer Mother        in 5's   Cancer Maternal Uncle        unk type   Lung cancer Maternal Grandfather 42   Heart Problems Paternal Grandfather    Melanoma Cousin    Ovarian cancer Paternal Great-grandmother    Cancer Paternal Great-grandmother        GYN cancer, possibly ovarian?   Cancer Maternal Aunt        half aunts/uncles x5- rare cancers   Social History:  reports that she has never smoked. She has never used smokeless tobacco. She reports previous alcohol use. She reports that she does not use drugs.  Allergies:  Allergies  Allergen Reactions   Sulfa Antibiotics     Other reaction(s): Rash, urticarial   Hydromorphone Anxiety and Other (See Comments)   Oxycodone Anxiety   Propoxyphene Rash and Anxiety   Sulfamethoxazole Rash  Feel sun burn    Medications Prior to Admission  Medication Sig Dispense Refill   clonazePAM (KLONOPIN) 1 MG tablet TAKE 1 TABLET BY MOUTH 2 TIMES DAILY AS NEEDED FOR ANXIETY. 60 tablet 1   gabapentin (NEURONTIN) 100 MG capsule Take 3 capsules (300 mg total) by mouth 2 (two) times daily. 180 capsule 3   l-methylfolate-B6-B12 (METANX) 3-35-2 MG TABS tablet Take 1 tablet by mouth daily.     LORazepam (ATIVAN) 0.5 MG tablet TAKE 1 TABLET BY MOUTH TWICE A DAY FOR NAUSEA OR ANXIETY. DO NOT TAKE AT THE SAME TIME AS CLONAZEPAM 40 tablet 0   MAGNESIUM CITRATE PO Take 405 mg by mouth daily. 135 mg     omeprazole (PRILOSEC) 20 MG capsule Take 1 capsule (20 mg total) by mouth daily. 30 capsule 3   sertraline (ZOLOFT) 100 MG tablet Take 100 mg by mouth at bedtime.     albuterol (VENTOLIN HFA) 108 (90 Base) MCG/ACT inhaler Inhale 1-2 puffs into the lungs every 4 (four) hours as  needed for wheezing or shortness of breath. 8 g 2   fluticasone (FLONASE) 50 MCG/ACT nasal spray Place 1 spray into both nostrils daily as needed for allergies.     lidocaine-prilocaine (EMLA) cream Apply to affected area once 30 g 3   Multiple Vitamins-Minerals (MULTIVITAMIN WITH MINERALS) tablet Take 1 tablet by mouth daily.     traMADol (ULTRAM) 50 MG tablet Take 1 tablet (50 mg total) by mouth every 6 (six) hours as needed. 10 tablet 0    Results for orders placed or performed during the hospital encounter of 02/26/21 (from the past 48 hour(s))  Pregnancy, urine POC     Status: None   Collection Time: 02/26/21  1:00 PM  Result Value Ref Range   Preg Test, Ur NEGATIVE NEGATIVE    Comment:        THE SENSITIVITY OF THIS METHODOLOGY IS >24 mIU/mL    [place] AXILLA LEFT  Result Date: 02/24/2021 CLINICAL DATA:  The patient presented for localization of a previously biopsied left axillary node. The patient also presented for localization of a biopsy clip marking a mass in the left breast at 7 o'clock, 7 cm from the nipple biopsied September 09, 2020. The 7 o'clock mass is no longer present based on an MRI from January 20, 2021. The left axillary node has also returned to normal size based on MRI. The mass at 7 o'clock could not be localized under mammographic guidance since the clip was immediately adjacent to the pectoralis muscle and could not be pulled into view either on the day of biopsy or today. EXAM: ULTRASOUND OF THE LEFT AXILLA AND LEFT BREAST COMPARISON:  Previous exam(s). FINDINGS: On physical exam,no suspicious lumps are identified. Ultrasound is performed, showing no evidence of the biopsy clip at 7 o'clock or within the previously biopsied left axillary node. IMPRESSION: Neither biopsy clip discussed above could be visualized with ultrasound. As a result, the mass at 7 o'clock in the left breast was not localized today. The left axillary lymph node was not localized today either. We will  attempt to localize the 7 o'clock mass on February 26, 2021. We will place a clip as close to the remaining biopsy clip as possible under MRI guidance. We will then bring the patient to the breast Center to localize the clip. A Q shaped clip should be placed to aid with localization under ultrasound if the clip placed under MRI guidance cannot be seen mammographically. The  patient's surgeon was notified of the above. RECOMMENDATION: Recommend localization of the 7 o'clock left breast mass as described above in the impression section. I have discussed the findings and recommendations with the patient. If applicable, a reminder letter will be sent to the patient regarding the next appointment. BI-RADS CATEGORY  2: Benign. Electronically Signed   By: Dorise Bullion III M.D   On: 02/24/2021 16:08  MM LT RADIOACTIVE SEED LOC MAMMO GUIDE  Result Date: 02/24/2021 CLINICAL DATA:  Localization of the patient's known malignancy in the lateral left breast. EXAM: MAMMOGRAPHIC GUIDED RADIOACTIVE SEED LOCALIZATION OF THE LEFT BREAST COMPARISON:  Previous exam(s). FINDINGS: Patient presents for radioactive seed localization prior to surgery. I met with the patient and we discussed the procedure of seed localization including benefits and alternatives. We discussed the high likelihood of a successful procedure. We discussed the risks of the procedure including infection, bleeding, tissue injury and further surgery. We discussed the low dose of radioactivity involved in the procedure. Informed, written consent was given. The usual time-out protocol was performed immediately prior to the procedure. Using mammographic guidance, sterile technique, 1% lidocaine and an I-125 radioactive seed, the coil shaped clip marking the patient's known lateral left breast malignancy was localized using a superior approach. The follow-up mammogram images confirm the seed in the expected location and were marked for Dr. Donne Hazel. Follow-up survey of  the patient confirms presence of the radioactive seed. Order number of I-125 seed:  [id number]. Total activity:  3.664 millicuries reference Date: January 09, 2021 The patient tolerated the procedure well and was released from the [place]. She was given instructions regarding seed removal. IMPRESSION: Radioactive seed localization left breast. No apparent complications. 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 Patient is status post neoadjuvant chemotherapy. The mass was no longer seen sonographically therefore seed placement could not be placed with sonographic guidance. The ribbon shaped clip was located far posteriorly and not seen on prior mammograms so the seed placement could not be placed mammographically. A Q shaped clip was placed with MR guidance for localization purposes. EXAM: MAMMOGRAPHIC GUIDED RADIOACTIVE SEED LOCALIZATION OF THE LEFT BREAST COMPARISON:  Previous exam(s). FINDINGS: Patient presents for radioactive seed localization prior to surgery. I met with the patient and we discussed the procedure of seed localization including benefits and alternatives. We discussed the high likelihood of a successful procedure. We discussed the risks of the procedure including infection, bleeding, tissue injury and further surgery. We discussed the low dose of radioactivity involved in the procedure. Informed, written consent was given. The usual time-out protocol was performed immediately prior to the procedure. Using mammographic guidance, sterile technique, 1% lidocaine and an I-125 radioactive seed, the Q shaped clip was localized using a lateral to medial approach. The follow-up mammogram images confirm the seed in the expected location and were marked for Dr. Donne Hazel. Follow-up survey of  the patient confirms presence of the radioactive seed. Order number of I-125 seed:  [id number]. Total activity:  5.638 millicurie reference Date: 01/09/2021 The patient tolerated the procedure well and was released from the [place]. She was given instructions regarding seed removal. IMPRESSION: Radioactive seed localization the left breast. No apparent complications. The radioactive seed is located approximately 2.3 cm anterior and inferior to the ribbon  shaped clip. Findings were discussed with Dr. Donne Hazel. Electronically Signed   By: Lillia Mountain M.D.   On: 02/26/2021 11:28  MM CLIP PLACEMENT LEFT  Result Date: 02/26/2021 CLINICAL DATA:  Status post MR guided clip placement in the left breast for localization purposes. EXAM: 3D DIAGNOSTIC LEFT 2 MAMMOGRAM POST MRI GUIDED CLIP PLACEMENT COMPARISON:  Previous exam(s). FINDINGS: 3D Mammographic images were obtained following MR guided clip placement guided biopsy of of the left breast. The Q shaped clip is located in the far posterior aspect of the lower inner quadrant of the breast. IMPRESSION: Appropriate positioning of the Q shaped biopsy marking clip for localization purposes. Final Assessment: Post Procedure Mammograms for Marker Placement Electronically Signed   By: Lillia Mountain M.D.   On: 02/26/2021 11:34  MR LT BREAST BX W LOC DEV 1ST LESION IMAGE BX SPEC MR GUIDE  Result Date: 02/26/2021 CLINICAL DATA:  MR guided clip placement. Patient has a known invasive ductal carcinoma far posteriorly in the 7 o'clock region of the left breast. At the time of biopsy a ribbon shaped clip was placed. The patient is status post neoadjuvant chemotherapy. Localization could not be performed mammographically or sonographically. A Q shaped clip will be placed anterior to the known area of cancer (ribbon shaped clip) for localization purposes. EXAM: MRI GUIDED CORE NEEDLE BIOPSY OF THE LEFT BREAST TECHNIQUE: Nonfat sat multiplanar, multisequence MR imaging of the  the left breast was performed. CONTRAST:  None COMPARISON:  Previous exams. FINDINGS: Using MR guidance and a lateral approach a Q shaped clip was placed anterior to the signal void artifact from the ribbon shaped clip located far posteriorly in the left breast. Patient will go to the breast center for clip films and radioactive seed placement. IMPRESSION: MRI guided left breast clip placement.  No apparent complications. Electronically Signed   By: Lillia Mountain M.D.   On: 02/26/2021 11:40    Review of Systems  All other systems reviewed and are negative.  Blood pressure 104/61, pulse (!) 55, temperature 97.8 F (36.6 C), temperature source Oral, resp. rate (!) 9, height 5' 6"  (1.676 m), weight 92.1 kg, SpO2 97 %. Physical Exam  General Mental Status - Alert. Orientation - Oriented X3. Breast Nipples - No Discharge. Breast Lump - No Palpable Breast Mass. Lymphatic Head & Neck General Head & Neck Lymphatics: Bilateral - Description - Normal. Axillary General Axillary Region: Bilateral - Description - Normal. Note:  no Lander adenopathy Cv rrr Lungs clear  Assessment/Plan BREAST CANCER METASTASIZED TO AXILLARY LYMPH NODE (C50.919)  Left breast seed guided double lumpectomy, left TAD, poss port removal she has great response radiologically to chemotherapy. understands path response may be different ultimately her goal if not compromising her cancer care is to have attempt to preserve sensation to breast skin/nac with nsm and to have diep flaps. she has spoken to a Psychiatric nurse in [place] who will do this in future. due to this I discussed there is timeliness to treating her cancer. We discussed I think reasonable to do TAD. this includes seed guided node excision and sn biopsy. I told her I prefer to wait for final path and if positive likely will recommend return for alnd. risk of lymphedema and rationale for procedure discussed. We also discussed breast. she is getting radiotherapy with  either lumpectomy or mastectomy. with her ultimate goal in mind we discussed either mastectomy now (would not be nsm due to ptosis/breast size) with expander, mastectomy alone, or double lumpectomy with  lift/reduction a week later if margins clear with plastics. she could then undergo radiotherapy and then go get nsm with diep flaps six months or so out. she would like to proceed with this plan. I did tell her that i think ultimately with her situation double lumpectomy not ideal so would need to do this plan. She would like to proceed this way and I will schedule for 4-6 weeks out from chemo completion.  [name], MD 02/26/2021, 2:14 PM   Addendum: radiology unable to locate prior clipped node so will proceed with dual tracer sn biopsy alone.

## 2021-02-26 NOTE — Anesthesia Procedure Notes (Signed)
Anesthesia Regional Block: Pectoralis block   Pre-Anesthetic Checklist: , timeout performed,  Correct Patient, Correct Site, Correct Laterality,  Correct Procedure, Correct Position, site marked,  Risks and benefits discussed,  Surgical consent,  Pre-op evaluation,  At surgeon's request and post-op pain management  Laterality: Left  Prep: chloraprep       Needles:  Injection technique: Single-shot  Needle Type: Echogenic Needle     Needle Length: 10cm  Needle Gauge: 21     Additional Needles:   Narrative:  Start time: 02/26/2021 1:54 PM End time: 02/26/2021 1:57 PM Injection made incrementally with aspirations every 5 mL.  Performed by: Personally  Anesthesiologist: Audry Pili, MD  Additional Notes: No pain on injection. No increased resistance to injection. Injection made in 5cc increments. Good needle visualization. Patient tolerated the procedure well.

## 2021-02-26 NOTE — Transfer of Care (Signed)
Immediate Anesthesia Transfer of Care Note  Patient: Tracy Dyer  Procedure(s) Performed: LEFT BREAST LUMPECTOMY WITH RADIOACTIVE SEED x2; LEFT AXILLARY SENTINEL NODE BIOPSY (Left: Breast)  Patient Location: PACU  Anesthesia Type:GA combined with regional for post-op pain  Level of Consciousness: sedated  Airway & Oxygen Therapy: Patient Spontanous Breathing and Patient connected to face mask oxygen  Post-op Assessment: Report given to RN and Post -op Vital signs reviewed and stable  Post vital signs: Reviewed and stable  Last Vitals:  Vitals Value Taken Time  BP 114/72 02/26/21 1613  Temp    Pulse 54 02/26/21 1615  Resp 15 02/26/21 1615  SpO2 99 % 02/26/21 1615  Vitals shown include unvalidated device data.  Last Pain:  Vitals:   02/26/21 1317  TempSrc: Oral  PainSc: 0-No pain      Patients Stated Pain Goal: 5 ([id number])  Complications: No notable events documented.

## 2021-02-27 ENCOUNTER — Encounter (HOSPITAL_BASED_OUTPATIENT_CLINIC_OR_DEPARTMENT_OTHER): Payer: Self-pay | Admitting: General Surgery

## 2021-02-28 ENCOUNTER — Other Ambulatory Visit: Payer: Self-pay

## 2021-02-28 ENCOUNTER — Encounter (HOSPITAL_BASED_OUTPATIENT_CLINIC_OR_DEPARTMENT_OTHER): Payer: Self-pay | Admitting: Plastic Surgery

## 2021-03-03 ENCOUNTER — Encounter: Payer: Self-pay | Admitting: *Deleted

## 2021-03-03 ENCOUNTER — Other Ambulatory Visit: Payer: Self-pay | Admitting: General Surgery

## 2021-03-03 ENCOUNTER — Encounter: Payer: Self-pay | Admitting: Hematology and Oncology

## 2021-03-03 DIAGNOSIS — N6320 Unspecified lump in the left breast, unspecified quadrant: Secondary | ICD-10-CM

## 2021-03-03 LAB — SURGICAL PATHOLOGY

## 2021-03-03 NOTE — Progress Notes (Signed)

## 2021-03-04 ENCOUNTER — Encounter: Payer: Self-pay | Admitting: Hematology and Oncology

## 2021-03-04 ENCOUNTER — Ambulatory Visit (HOSPITAL_BASED_OUTPATIENT_CLINIC_OR_DEPARTMENT_OTHER): Admission: RE | Admit: 2021-03-04 | Payer: BC Managed Care – PPO | Source: Home / Self Care | Admitting: Plastic Surgery

## 2021-03-04 SURGERY — RECONSTRUCTION, BREAST
Anesthesia: General | Site: Breast | Laterality: Right

## 2021-03-06 ENCOUNTER — Other Ambulatory Visit: Payer: Self-pay

## 2021-03-06 ENCOUNTER — Encounter: Payer: Self-pay | Admitting: Adult Health

## 2021-03-06 ENCOUNTER — Encounter: Payer: Self-pay | Admitting: *Deleted

## 2021-03-06 ENCOUNTER — Encounter (HOSPITAL_BASED_OUTPATIENT_CLINIC_OR_DEPARTMENT_OTHER): Payer: Self-pay | Admitting: Plastic Surgery

## 2021-03-07 ENCOUNTER — Encounter: Payer: Self-pay | Admitting: Hematology and Oncology

## 2021-03-07 ENCOUNTER — Telehealth: Payer: Self-pay

## 2021-03-07 NOTE — Telephone Encounter (Signed)
Notified [place] of completion of Disability forms. Fax transmission confirmation received, copy forwarded to HIM as an urgent request, and copy mailed to [place] as requested

## 2021-03-08 ENCOUNTER — Ambulatory Visit
Admission: RE | Admit: 2021-03-08 | Discharge: 2021-03-08 | Disposition: A | Discharge status: Home / Self Care | Payer: BC Managed Care – PPO | Source: Ambulatory Visit | Attending: General Surgery | Admitting: General Surgery

## 2021-03-08 ENCOUNTER — Other Ambulatory Visit: Payer: Self-pay

## 2021-03-08 DIAGNOSIS — C50912 Malignant neoplasm of unspecified site of left female breast: Secondary | ICD-10-CM | POA: Diagnosis not present

## 2021-03-08 DIAGNOSIS — N6320 Unspecified lump in the left breast, unspecified quadrant: Secondary | ICD-10-CM

## 2021-03-08 NOTE — Progress Notes (Signed)
Patient Care Team: [name], MD as PCP - General (Family Medicine)  DIAGNOSIS:    ICD-10-CM   1. Malignant neoplasm of upper-outer quadrant of left breast in female, estrogen receptor positive ([place])  C50.412    Z17.0       SUMMARY OF ONCOLOGIC HISTORY: Oncology History  Malignant neoplasm of upper-outer quadrant of left breast in female, estrogen receptor positive ([place])  08/21/2020 Initial Diagnosis   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 No pathogenic variants identified on the [facility] Breast Cancer STAT Panel + Multi-Cancer Panel. [facility] in [facility] called c.983C>T identified. The report date is 09/04/2020.   The STAT Breast cancer panel offered by [facility] includes sequencing and rearrangement analysis for the following 9 genes:  ATM, BRCA1, BRCA2, CDH1, CHEK2, PALB2, PTEN, STK11 and TP53.    The Multi-Cancer Panel offered by [facility] includes sequencing and/or deletion duplication testing of the following 85 genes: AIP, ALK, APC, ATM, AXIN2,BAP1,  BARD1, BLM, BMPR1A, BRCA1, BRCA2, BRIP1, CASR, CDC73, CDH1, CDK4, CDKN1B, CDKN1C, CDKN2A (p14ARF), CDKN2A (p16INK4a), CEBPA, CHEK2, CTNNA1, DICER1, DIS3L2, EGFR (c.2369C>T, p.Thr790Met variant only), EPCAM (Deletion/duplication testing only), FH, FLCN, GATA2, GPC3, GREM1 (Promoter region deletion/duplication testing only), HOXB13 (c.251G>A, p.Gly84Glu), HRAS, KIT,  MAX, MEN1, MET, MITF (c.952G>A, p.Glu318Lys variant only), MLH1, MSH2, MSH3, MSH6, MUTYH, NBN, NF1, NF2, NTHL1, PALB2, PDGFRA, PHOX2B, PMS2, POLD1, POLE, POT1, PRKAR1A, PTCH1, PTEN, RAD50, RAD51C, RAD51D, RB1, RECQL4, RET, RNF43, RUNX1, SDHAF2, SDHA (sequence changes only), SDHB, SDHC, SDHD, SMAD4, SMARCA4, SMARCB1, SMARCE1, STK11, SUFU, TERC, TERT, TMEM127, TP53, TSC1, TSC2, VHL, WRN and WT1.    09/06/2020 - 01/17/2021 Chemotherapy          02/26/2021 Surgery   Left lumpectomy: no residual carcinoma with margins and axillary lymph nodes also negative for carcinoma       CHIEF COMPLIANT: Follow-up of breast cancer  INTERVAL HISTORY: Tracy Dyer is a 50 y.o. with above-mentioned history of breast cancer who has received neoadjuvant chemotherapy with dose dense Adriamycin and Cytoxan followed by Taxol. She underwent a left lumpectomy with Dr. [name] on 02/26/21 for which the pathology showed no evidence of residual carcinoma with margins and axillary lymph nodes also negative for carcinoma. She presents to the clinic today for follow-up  and to review the pathology report.   ALLERGIES:  is allergic to sulfa antibiotics and sulfamethoxazole.  MEDICATIONS:  Current Outpatient Medications  Medication Sig Dispense Refill   gabapentin (NEURONTIN) 300 MG capsule Take 2 capsules (600 mg total) by mouth at bedtime. 180 capsule 3   albuterol (VENTOLIN HFA) 108 (90 Base) MCG/ACT inhaler Inhale 1-2 puffs into the lungs every 4 (four) hours as needed for wheezing or shortness of breath. 8 g 2   clonazePAM (KLONOPIN) 1 MG tablet TAKE 1 TABLET BY MOUTH 2 TIMES DAILY AS NEEDED FOR ANXIETY. 60 tablet 1   fluticasone (FLONASE) 50 MCG/ACT nasal spray Place 1 spray into both nostrils  daily as needed for allergies.     l-methylfolate-B6-B12 (METANX) 3-35-2 MG TABS tablet Take 1 tablet by mouth daily.     LORazepam (ATIVAN) 0.5 MG tablet TAKE 1 TABLET BY MOUTH TWICE A DAY FOR NAUSEA OR  ANXIETY. DO NOT TAKE AT THE SAME TIME AS CLONAZEPAM 40 tablet 0   MAGNESIUM CITRATE PO Take 405 mg by mouth daily. 135 mg     Multiple Vitamins-Minerals (MULTIVITAMIN WITH MINERALS) tablet Take 1 tablet by mouth daily.     omeprazole (PRILOSEC) 20 MG capsule Take 1 capsule (20 mg total) by mouth daily. 30 capsule 3   sertraline (ZOLOFT) 100 MG tablet Take 100 mg by mouth at bedtime.     traMADol (ULTRAM) 50 MG tablet Take 1 tablet (50 mg total) by mouth every 6 (six) hours as needed. 10 tablet 0   traMADol (ULTRAM) 50 MG tablet Take 2 tablets (100 mg total) by mouth every 6 (six) hours as needed. 10 tablet 0   No current facility-administered medications for this visit.    PHYSICAL EXAMINATION: ECOG PERFORMANCE STATUS: 1 - Symptomatic but completely ambulatory  Vitals:   03/10/21 1151  BP: 111/68  Pulse: 60  Resp: 18  SpO2: 100%   Filed Weights   03/10/21 1151  Weight: 202 lb 3.2 oz (91.7 kg)    BREAST: No palpable masses or nodules in either right or left breasts. No palpable axillary supraclavicular or infraclavicular adenopathy no breast tenderness or nipple discharge. (exam performed in the presence of a chaperone)  LABORATORY DATA:  I have reviewed the data as listed CMP Latest Ref Rng & Units 01/17/2021 01/10/2021 01/03/2021  Glucose 70 - 99 mg/dL 97 108(H) 102(H)  BUN 6 - 20 mg/dL _0 Creatinine 0.44 - 1.00 mg/dL 0.82 0.75 0.81  Sodium 135 - 145 mmol/L 139 139 140  Potassium 3.5 - 5.1 mmol/L 3.9 3.7 3.9  Chloride 98 - 111 mmol/L 109 108 107  CO2 22 - 32 mmol/L _1 Calcium 8.9 - 10.3 mg/dL 8.8(L) 9.1 9.4  Total Protein 6.5 - 8.1 g/dL 6.2(L) 6.2(L) 6.1(L)  Total Bilirubin 0.3 - 1.2 mg/dL 0.4 0.3 0.3  Alkaline Phos 38 - 126 U/L 60 60 59  AST 15 - 41 U/L _2 ALT 0 - 44 U/L _3 Lab Results  Component Value Date   WBC 3.2 (L) 01/17/2021   HGB 12.4 01/17/2021   HCT 35.8 (L) 01/17/2021   MCV 92.0 01/17/2021   PLT 214 01/17/2021   NEUTROABS 1.8  01/17/2021    ASSESSMENT & PLAN:  Malignant neoplasm of upper-outer quadrant of left breast in female, estrogen receptor positive ([place]) 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  Neoadjuvant chemotherapy with dose dense Adriamycin and Cytoxan followed by Taxol weekly x12 completed 01/17/2021 2. 02/26/2021 breast conserving surgery with sentinel lymph node and targeted node dissection,: Path CR 0/4 LN Neg 3.  Adjuvant  radiation therapy 4.  Followed by adjuvant antiestrogen therapy. ------------------------------------------------------------------------------------------------------------------------- There is a concern that one of the clips was not removed and therefore was missed.  Dr. Donne Hazel is planning to take her back in for more surgery on the coming Friday.  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    I recommended participation in the phase 2 ACCRU [place] 2102 study of N-Palmitoylrthanolamide vs placebo for the treatment of chemo induced peripheral neuropathy.   Return to clinic after radiation to start antiestrogen therapy.  Given the complete response we may consider treating her with tamoxifen alone rather than ovarian suppression with AI.   No orders of the defined types were placed in this encounter.  The patient has a good understanding of the overall plan. she  agrees with it. she will call with any problems that may develop before the next visit here.  Total time spent: 20 mins including face to face time and time spent for planning, charting and coordination of care  Rulon Eisenmenger, MD, MPH 03/10/2021  I, Thana Ates, am acting as scribe for Dr. [name].  I have reviewed the above documentation for accuracy and completeness, and I agree with the above.

## 2021-03-09 NOTE — Assessment & Plan Note (Signed)
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%, PR 95%, HER2 equivocal, FISH pending, Ki-67 20% T1CN1 stage IIa MammaPrint: High risk: Luminal type B, probability of path CR 6% with chemo and antiestrogen therapy predicted benefit of treatment at 5 years 94.6%, average 10-year risk of recurrence untreated: 29%  Treatment plan: 1.Neoadjuvant chemotherapy with dose dense Adriamycin and Cytoxan followed by Taxol weekly x12 2.breast conserving surgery with sentinel lymph node and targeted node dissection,: Path CR 0/4 LN Neg 3.Adjuvant radiation therapy 4.Followed by adjuvant antiestrogen therapy. -------------------------------------------------------------------------------------------------------------------------  chemo induced peripheral neuropathy: Patient rates her symptoms as 6/10.  Gabapentin is helping her go to sleep but it is not helping her through the day.  I recommended participation in the phase 2 ACCRU [place] 2102 study of N-Palmitoylrthanolamide vs placebo for the treatment of chemo induced peripheral neuropathy.

## 2021-03-10 ENCOUNTER — Other Ambulatory Visit: Payer: Self-pay

## 2021-03-10 ENCOUNTER — Other Ambulatory Visit: Payer: Self-pay | Admitting: General Surgery

## 2021-03-10 ENCOUNTER — Inpatient Hospital Stay: Payer: BC Managed Care – PPO | Attending: Hematology and Oncology | Admitting: Hematology and Oncology

## 2021-03-10 DIAGNOSIS — Z79899 Other long term (current) drug therapy: Secondary | ICD-10-CM | POA: Diagnosis not present

## 2021-03-10 DIAGNOSIS — Z170 Estrogen receptor positive status [ER+]: Secondary | ICD-10-CM

## 2021-03-10 DIAGNOSIS — C50412 Malignant neoplasm of upper-outer quadrant of left female breast: Secondary | ICD-10-CM | POA: Diagnosis not present

## 2021-03-10 DIAGNOSIS — C50912 Malignant neoplasm of unspecified site of left female breast: Secondary | ICD-10-CM

## 2021-03-10 MED ORDER — GABAPENTIN 300 MG PO CAPS: 600.0000 mg | ORAL_CAPSULE | Freq: Every day | ORAL | 3 refills | Status: DC

## 2021-03-12 ENCOUNTER — Other Ambulatory Visit: Payer: Self-pay

## 2021-03-12 ENCOUNTER — Ambulatory Visit
Admission: RE | Admit: 2021-03-12 | Discharge: 2021-03-12 | Disposition: A | Discharge status: Home / Self Care | Payer: BC Managed Care – PPO | Source: Ambulatory Visit | Attending: General Surgery | Admitting: General Surgery

## 2021-03-12 DIAGNOSIS — N6489 Other specified disorders of breast: Secondary | ICD-10-CM | POA: Diagnosis not present

## 2021-03-12 DIAGNOSIS — C50912 Malignant neoplasm of unspecified site of left female breast: Secondary | ICD-10-CM

## 2021-03-12 IMAGING — MR MR BREAST BILAT WO/W CM
8 of 12 series · 31 of 48 positions shown · IV contrast (10 ml gadavist)
Comparison: Previous examinations, including the post neoadjuvant
chemotherapy MRI dated [DATE] and clip placement MRI dated
[DATE].

CLINICAL DATA: Status post left lumpectomy for grade 3 invasive
ductal carcinoma in the 3 o'clock position of the left breast, 3 cm
from the nipple and invasive ductal carcinoma with lymphovascular
invasion in the far posterior aspect of the left breast in the 7
o'clock position, 7 cm from the nipple, containing a ribbon shaped
biopsy marker clip. The ribbon shaped clip could not be directly
localized due to its far posterior location and a Q shaped clip was
placed anterior to the ribbon shaped biopsy marker clip under MR
guidance for localization purposes. The Q shaped clip was removed at
surgery but the ribbon shaped clip was not removed at surgery with
no pathological findings to indicate that the area of concern,
marked with the ribbon shaped clip, had been resected. The current
examination is to determine if the artifact from the ribbon shaped
biopsy marker clip is still visible by MR for an attempt at MR
localization.

LABS:  None obtained on site today.
EXAM:
BILATERAL BREAST MRI WITH AND WITHOUT CONTRAST
TECHNIQUE: Multiplanar, multisequence MR images of both breasts were obtained
prior to and following the intravenous administration of 10 ml of
Gadavist

[Series 2: t2_tirm_tra ipat (a-p) · axial · 3.0mm · 0.70mm/px · 1 of 55 slices shown]
[im 1/55]
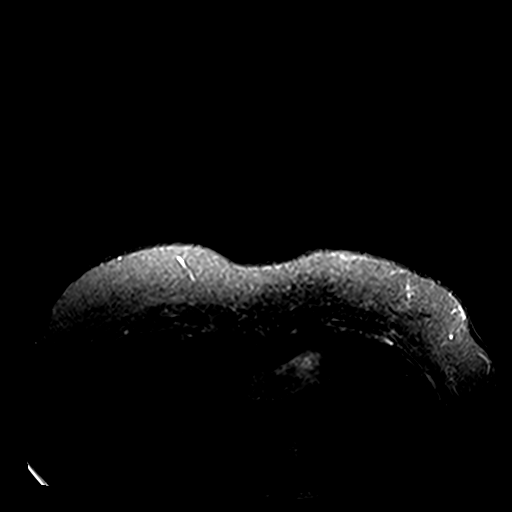

[Series 3: fl3d pre-cm no · axial · non-contrast · 1.2mm · 0.94mm/px · z∈[-90,+81]mm · 5 of 144 slices shown]
[im 1/144]
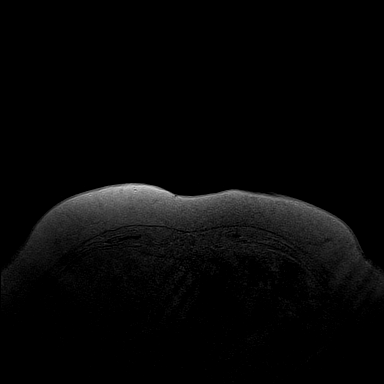
[im 36/144]
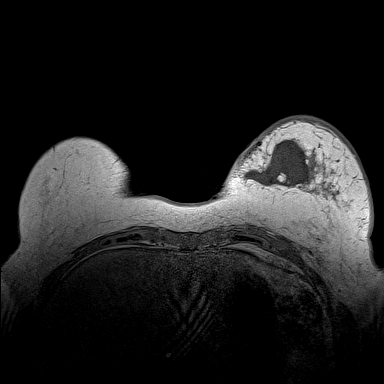
[im 72/144]
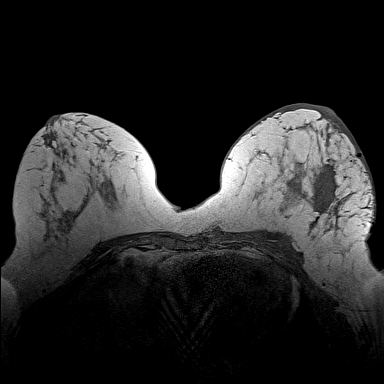
[im 108/144]
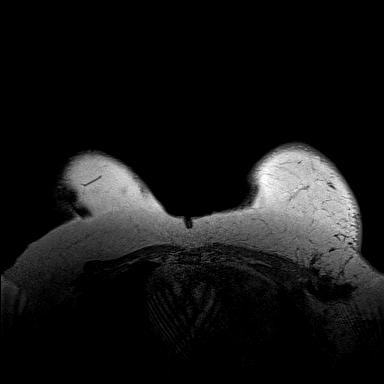
[im 144/144]
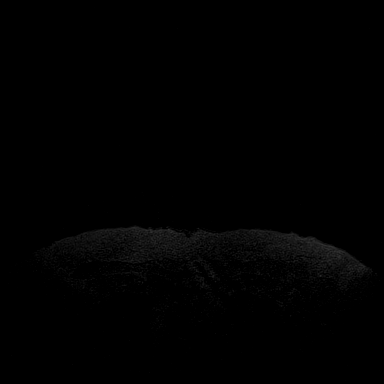

[Series 4: fl3d pre-cm · axial · non-contrast · 1.2mm · 0.94mm/px · z∈[-90,+81]mm · 5 of 144 slices shown]
[im 1/144]
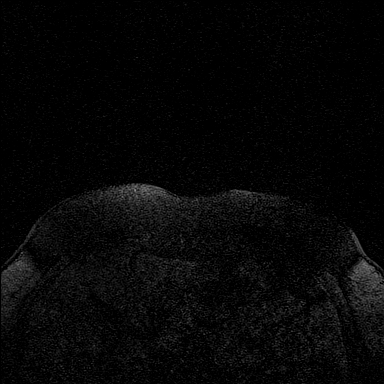
[im 36/144]
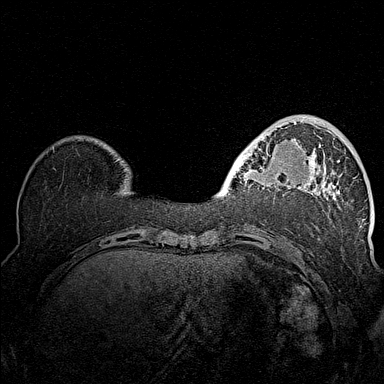
[im 72/144]
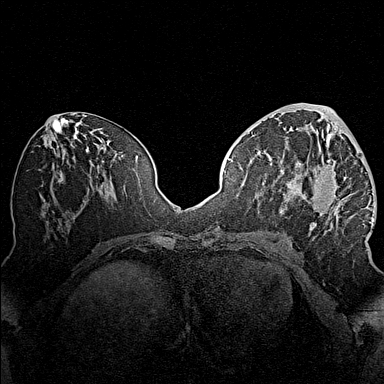
[im 108/144]
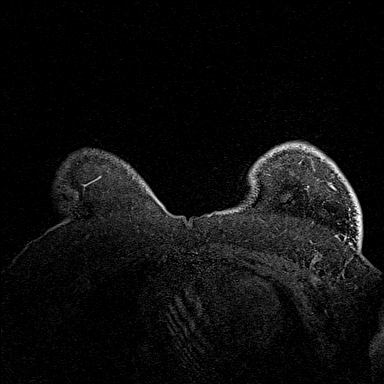
[im 144/144]
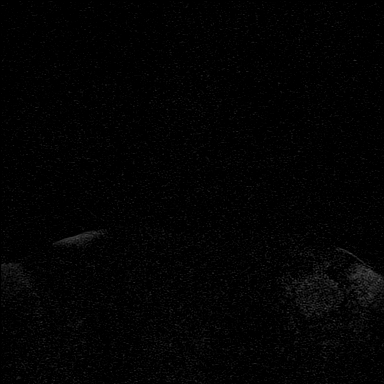

[Series 5: fl3d post-cm 20 · axial · 1.2mm · 0.94mm/px · z∈[-90,+81]mm · 5 of 144 slices shown (1 of 3)]
[im 1/144]
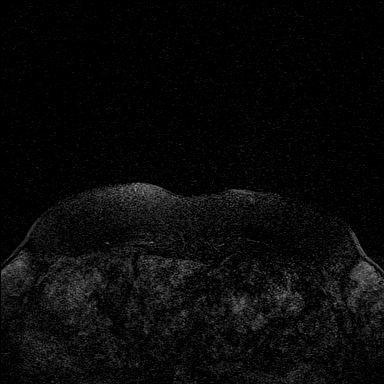
[im 36/144]
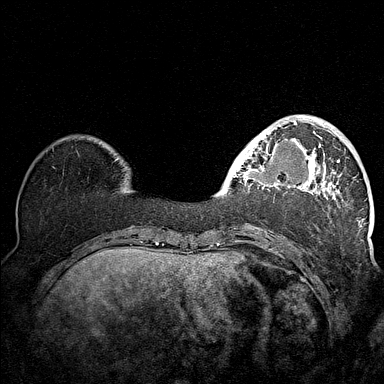
[im 72/144]
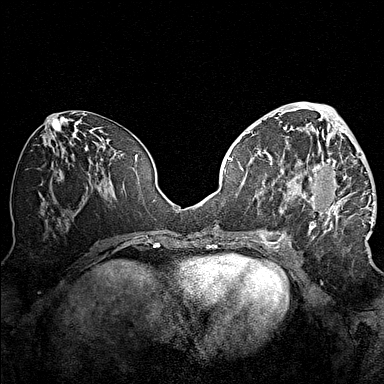
[im 108/144]
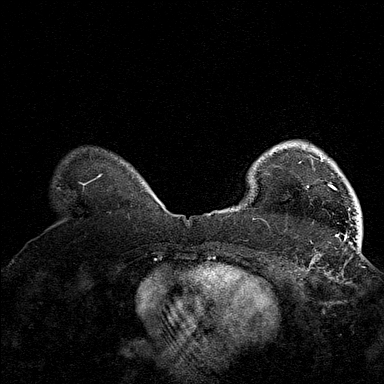
[im 144/144]
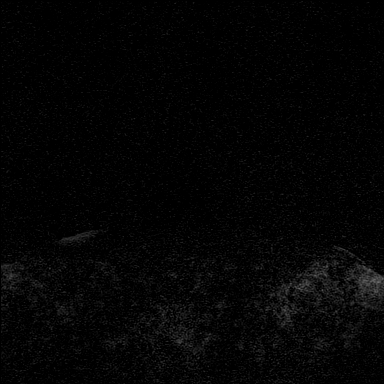

[Series 6: fl3d post-cm 20 · axial · 1.2mm · 0.94mm/px · z∈[-90,+81]mm · 5 of 144 slices shown (2 of 3)]
[im 1/144]
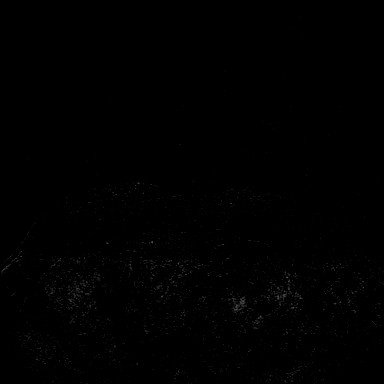
[im 36/144]
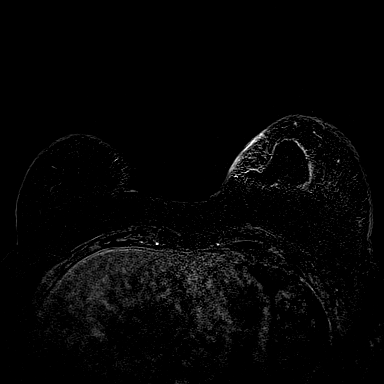
[im 72/144]
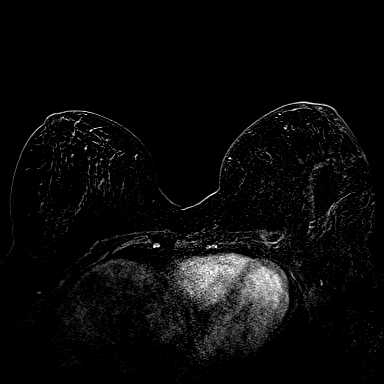
[im 108/144]
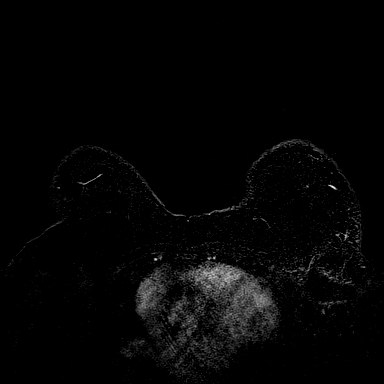
[im 144/144]
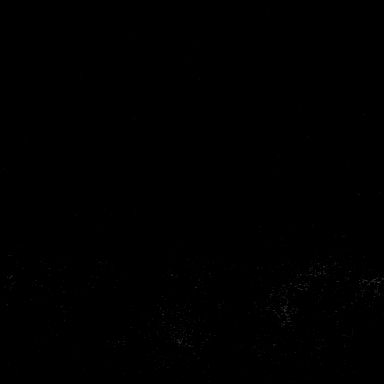

[Series 7: fl3d post-cm 20 · axial · 172.8mm · 0.94mm/px · 1 of 1 slices shown (3 of 3)]
[im 1/1]
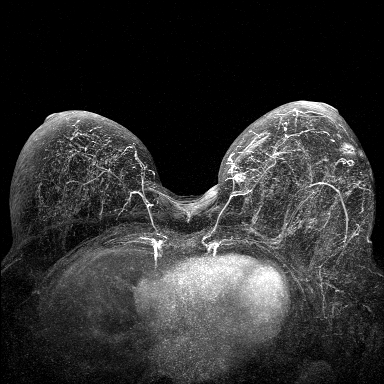

[Series 8: fl3d post-cm 3min · axial · 1.2mm · 0.94mm/px · z∈[-90,+81]mm · 6 of 144 slices shown]
[im 1/144]
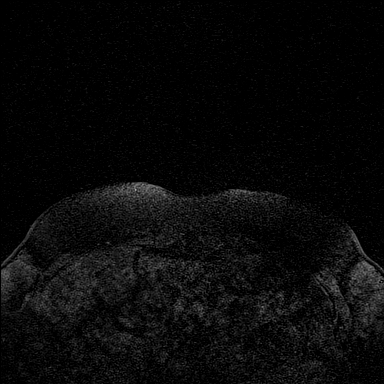
[im 29/144]
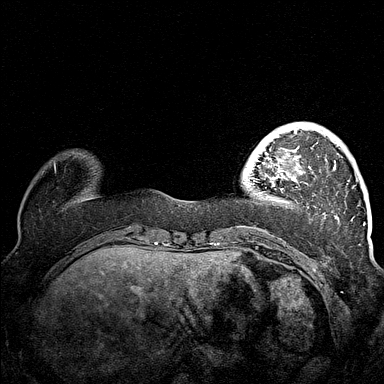
[im 58/144]
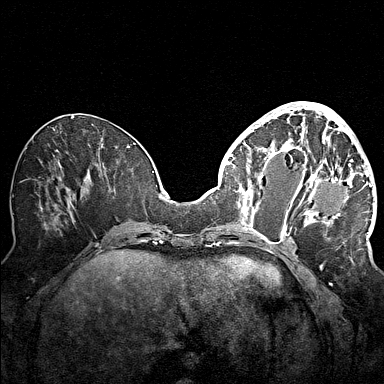
[im 86/144]
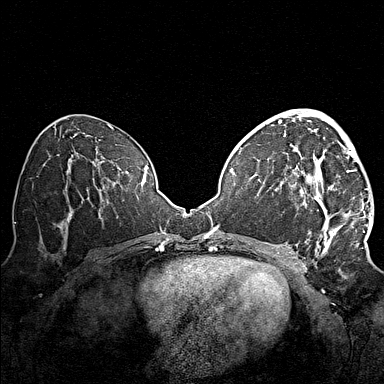
[im 115/144]
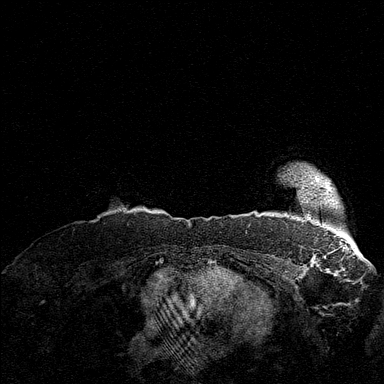
[im 144/144]
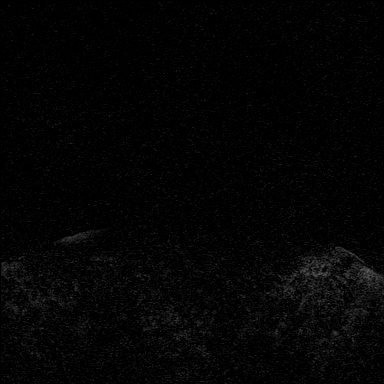

[Series 9: fl3d post-cm 3min_sub · axial · 1.2mm · 0.94mm/px · z∈[-90,-22]mm · 3 of 144 slices shown]
[im 1/144]
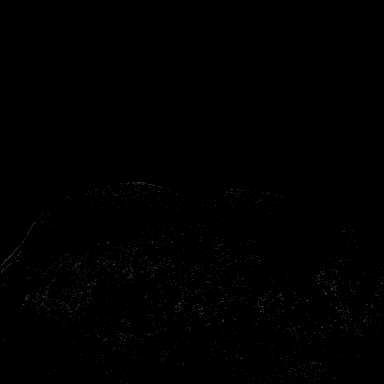
[im 29/144]
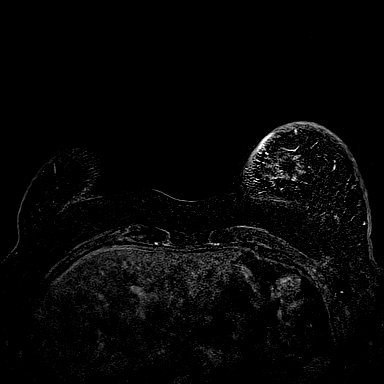
[im 58/144]
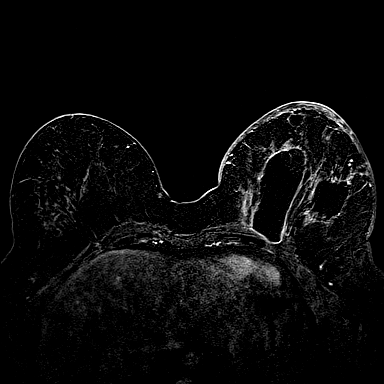

[31 of 48 positions shown; findings below may reference images not displayed]

Three-dimensional MR images were rendered by post-processing of the
original MR data on an independent workstation. The
three-dimensional MR images were interpreted, and findings are
reported in the following complete MRI report for this study. Three
dimensional images were evaluated at the independent interpreting
workstation using the DynaCAD thin client.
FINDINGS: Breast composition: c. Heterogeneous fibroglandular tissue.

Background parenchymal enhancement: Mild.

Right breast: No mass or abnormal enhancement.

Left breast: Extensive postsurgical changes with 2 postsurgical
seromas in the breast and diffuse skin thickening. There is also
postsurgical seroma in the left axilla. The biopsy marker clip
artifact seen on [DATE] is again demonstrated, best seen on
image number 97/3. This is at the posterior, inferior aspect of the
larger postsurgical seroma in the lower inner quadrant of the left
breast and is abutting the pectoralis muscle. Again, no associated
mass or abnormal enhancement is seen at that location, status post
neoadjuvant chemotherapy.

Lymph nodes: No abnormal appearing lymph nodes.

Ancillary findings:  None.
IMPRESSION: 1. The artifact associated with the ribbon shaped biopsy marker clip
in the far posterior aspect of the 7 o'clock position of the left
breast is still visible on the MR images. This can be used for
attempted MR guided needle localization.
2. Extensive postsurgical changes in the left breast and left
axilla.
3. No interval findings suspicious for malignancy.

RECOMMENDATION:
Attempt MR guided needle localization and surgical excision of the
ribbon shaped biopsy marker clip in the far posterior aspect of the
7 o'clock position of the left breast.

BI-RADS CATEGORY  6: Known biopsy-proven malignancy.

## 2021-03-12 MED ORDER — GADOBUTROL 1 MMOL/ML IV SOLN
10.0000 mL | Freq: Once | INTRAVENOUS | Status: AC | PRN
  Administered 2021-03-12: 10 mL via INTRAVENOUS

## 2021-03-14 ENCOUNTER — Ambulatory Visit (HOSPITAL_BASED_OUTPATIENT_CLINIC_OR_DEPARTMENT_OTHER): Admission: RE | Admit: 2021-03-14 | Payer: BC Managed Care – PPO | Source: Home / Self Care | Admitting: Plastic Surgery

## 2021-03-14 SURGERY — RECONSTRUCTION, BREAST
Anesthesia: General | Site: Breast | Laterality: Right

## 2021-03-21 ENCOUNTER — Other Ambulatory Visit: Payer: Self-pay | Admitting: General Surgery

## 2021-03-21 ENCOUNTER — Ambulatory Visit
Admission: RE | Admit: 2021-03-21 | Discharge: 2021-03-21 | Disposition: A | Discharge status: Home / Self Care | Payer: BC Managed Care – PPO | Source: Ambulatory Visit | Attending: General Surgery | Admitting: General Surgery

## 2021-03-21 ENCOUNTER — Other Ambulatory Visit: Payer: Self-pay

## 2021-03-21 DIAGNOSIS — N644 Mastodynia: Secondary | ICD-10-CM

## 2021-03-21 DIAGNOSIS — N6122 Granulomatous mastitis, left breast: Secondary | ICD-10-CM | POA: Diagnosis not present

## 2021-03-21 DIAGNOSIS — N6489 Other specified disorders of breast: Secondary | ICD-10-CM | POA: Diagnosis not present

## 2021-03-21 DIAGNOSIS — N63 Unspecified lump in unspecified breast: Secondary | ICD-10-CM

## 2021-03-21 IMAGING — US US BREAST*L* LIMITED INC AXILLA
1 series · 4 of 4 positions shown · non-contrast
Comparison: Previous exam(s).

CLINICAL DATA: 49-year-old female presenting with concerns for left
breast infection. Patient had a left breast lumpectomy and sentinel
node biopsy in [DATE]. She reports approximately 2 days ago new
swelling, erythema, and fevers. She was started on doxycycline.

EXAM:
ULTRASOUND OF THE LEFT BREAST

[Series 1: us breast*left* limited inc axilla · 0.08mm/px · 4 of 4 slices shown]
[im 1/4]
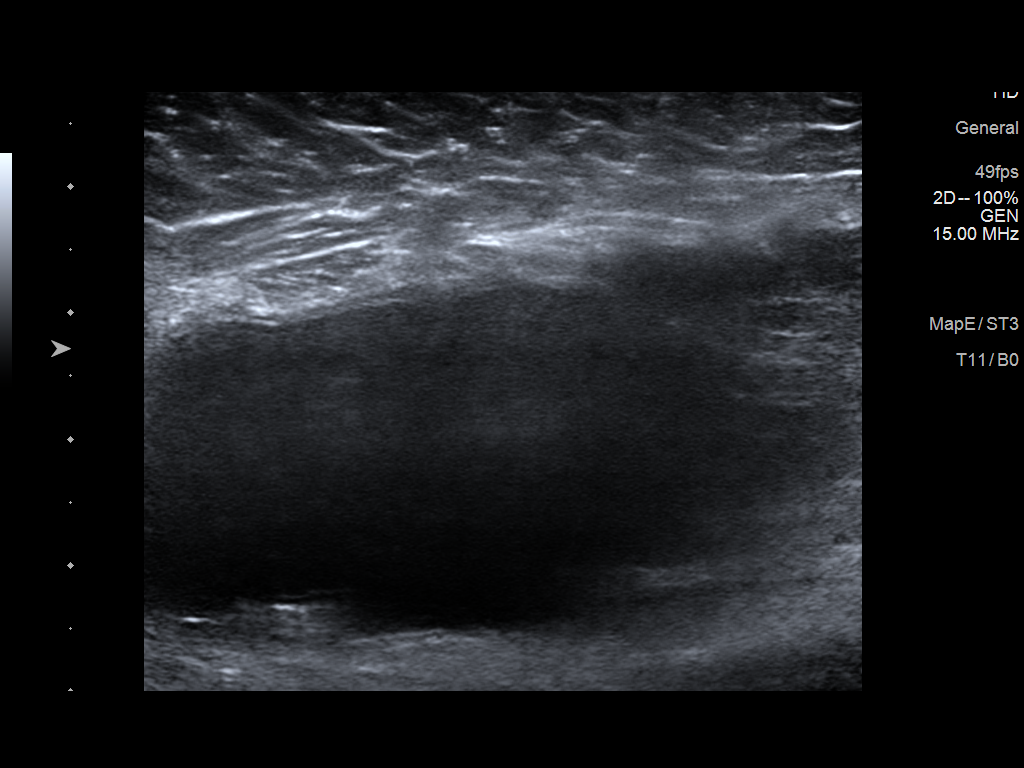
[im 2/4]
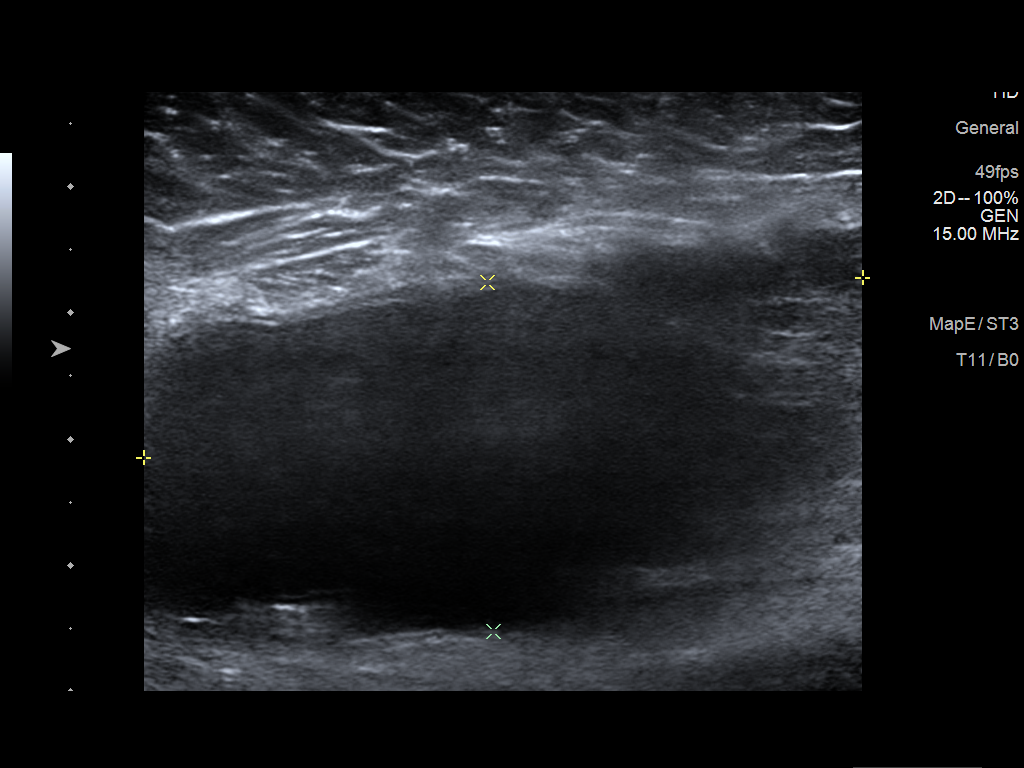
[im 3/4]
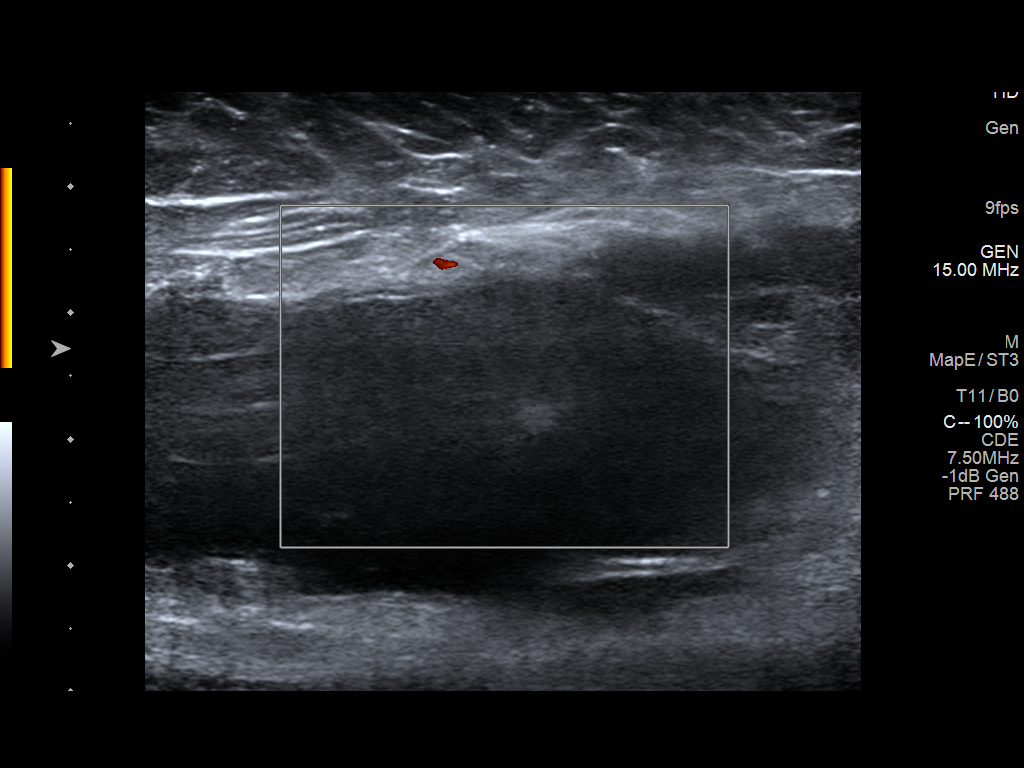
[im 4/4]
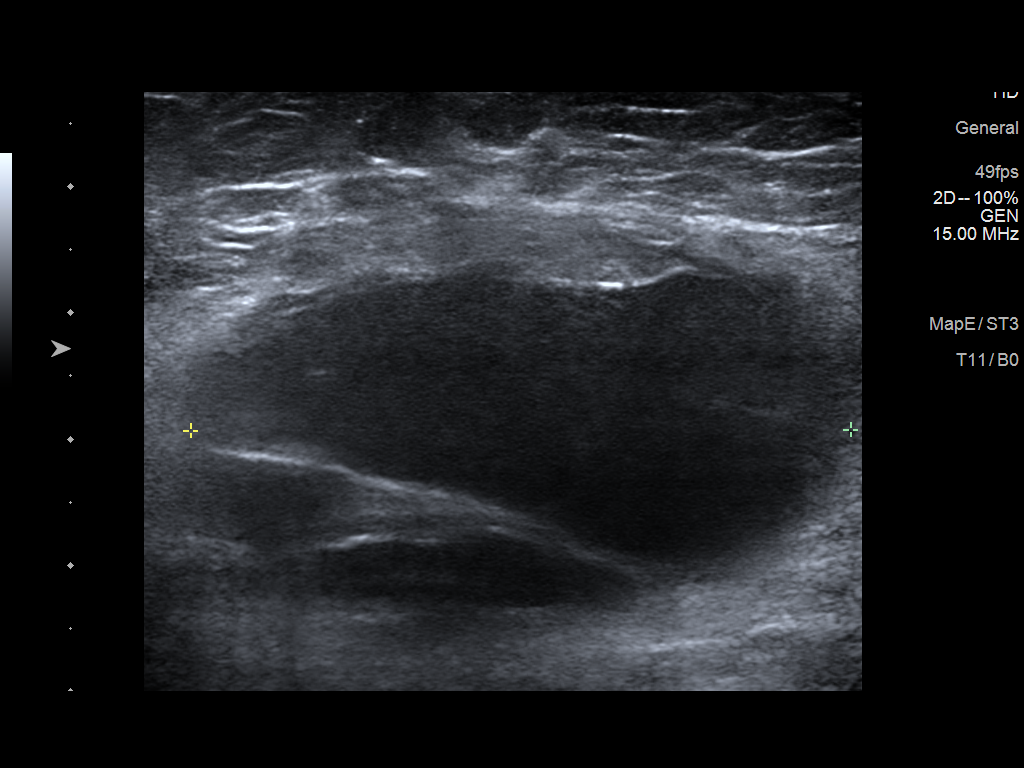

[4 of 4 positions shown; findings below may reference images not displayed]

FINDINGS: On physical exam there is mild erythema and induration in the low
left axilla at the site of patient's sentinel node biopsy. The
surgical site in the left breast are soft to palpation.

Targeted ultrasound is performed in the left breast at 2 o'clock 12
cm from nipple at the patient's sentinel biopsy scar demonstrating
an irregular complex collection measuring at least 5.9 x 2.8 x
cm, consistent with a seroma. There is surrounding echogenic tissue
concerning for infection/inflammation.
IMPRESSION: At the sentinel node scar site in the low left axilla there is a
complex collection measuring at least 5.9 cm, likely a seroma.
Clinical findings raise concern for superinfection.

RECOMMENDATION:
1. Ultrasound-guided aspiration of the left breast complex
collection. This will be performed same day.

2.  Continue course of antibiotics.

I have discussed the findings and recommendations with the patient.
If applicable, a reminder letter will be sent to the patient
regarding the next appointment.

BI-RADS CATEGORY  2: Benign.

## 2021-03-21 IMAGING — US US BREAST CYST ASPIRATION 1ST CYST
1 series · 5 of 5 positions shown · non-contrast
Comparison: Previous exams.
COMPARISON: Previous exams.

Addendum:
CLINICAL DATA: 49-year-old female presenting for biopsy of a left
breast collection.

EXAM:
ULTRASOUND GUIDED LEFT BREAST CYST ASPIRATION

[Series 1: us breast cyst aspiration 1st cyst · 0.07mm/px · 5 of 5 slices shown]
[im 1/5]
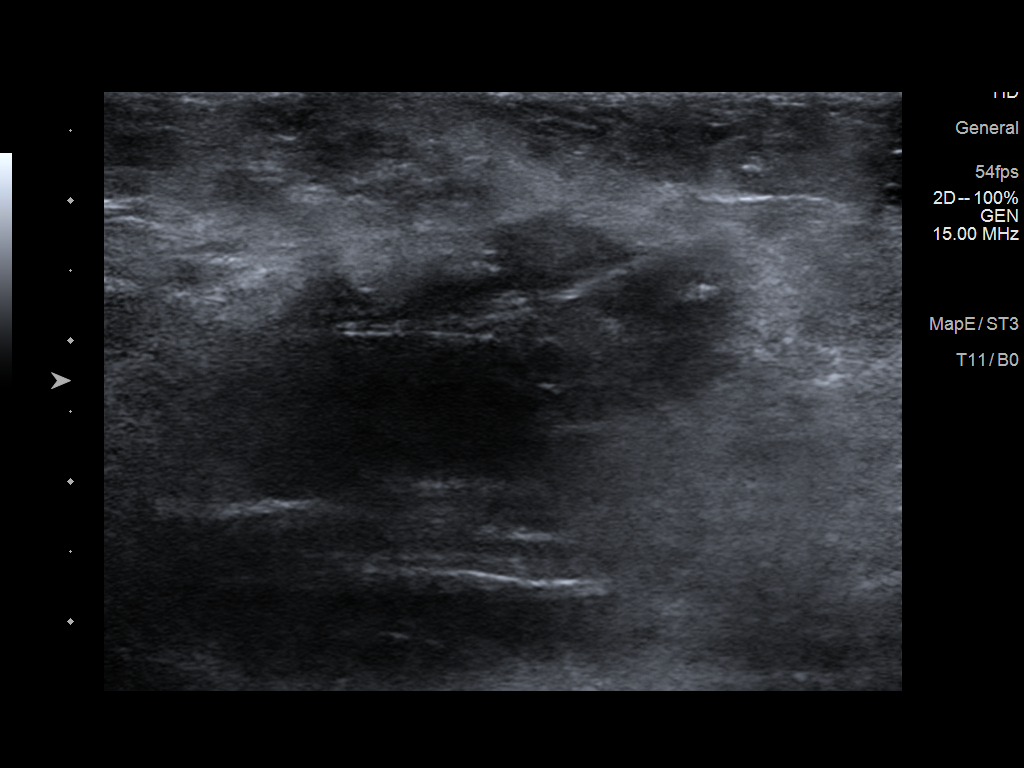
[im 2/5]
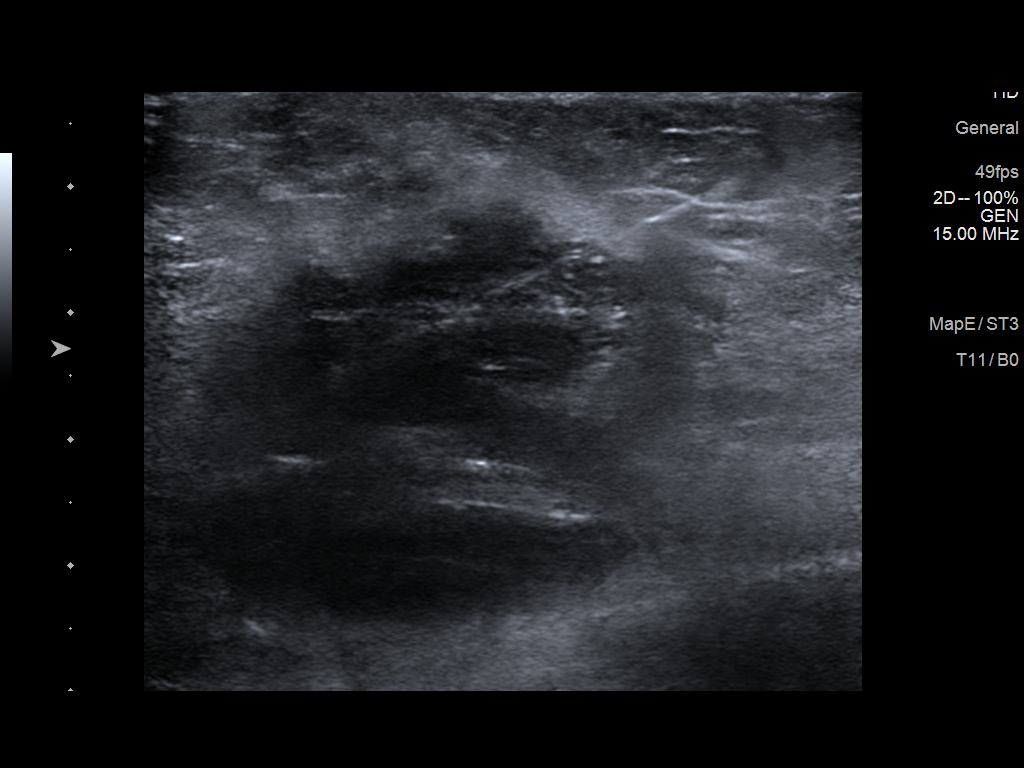
[im 3/5]
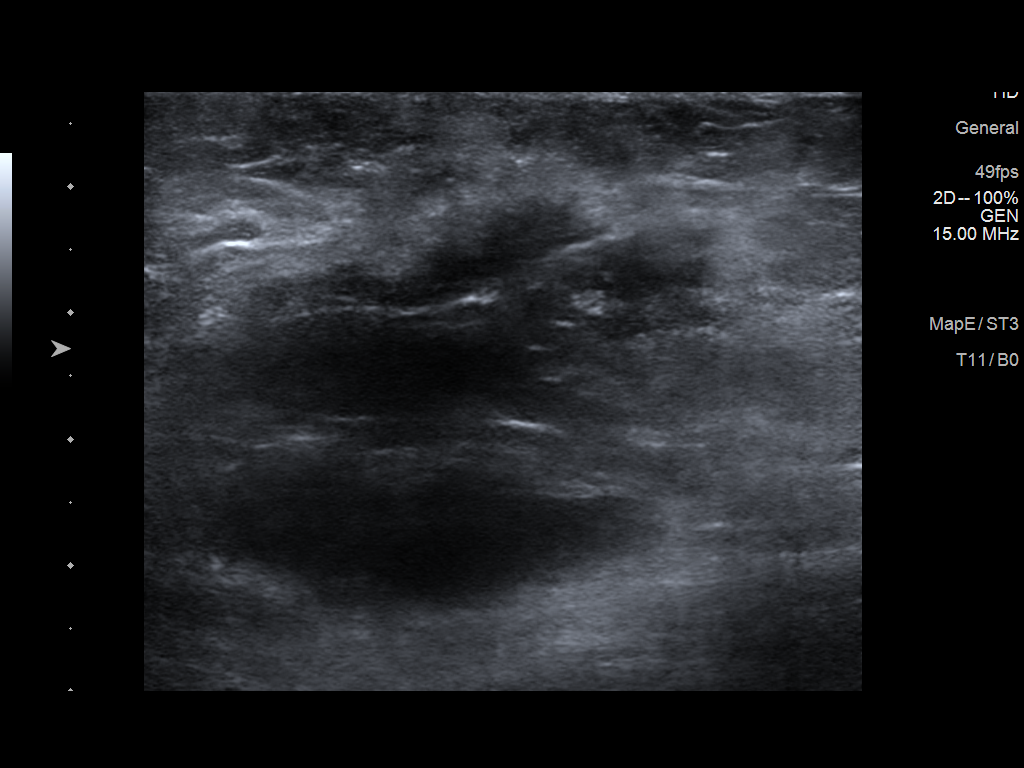
[im 4/5]
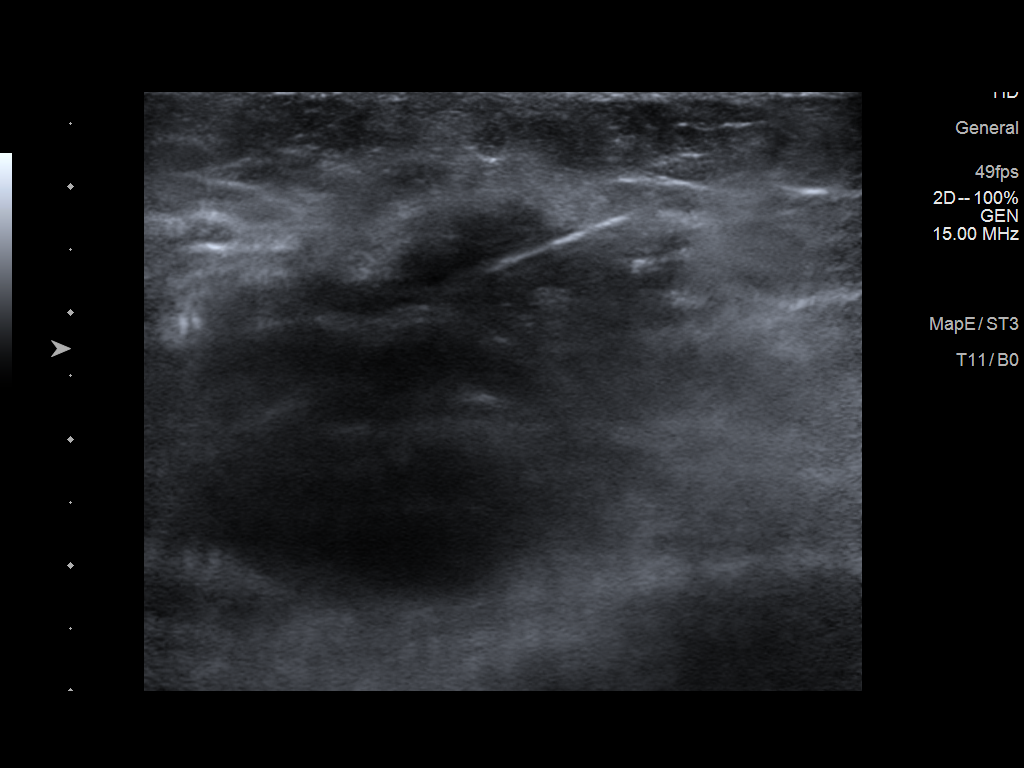
[im 5/5]
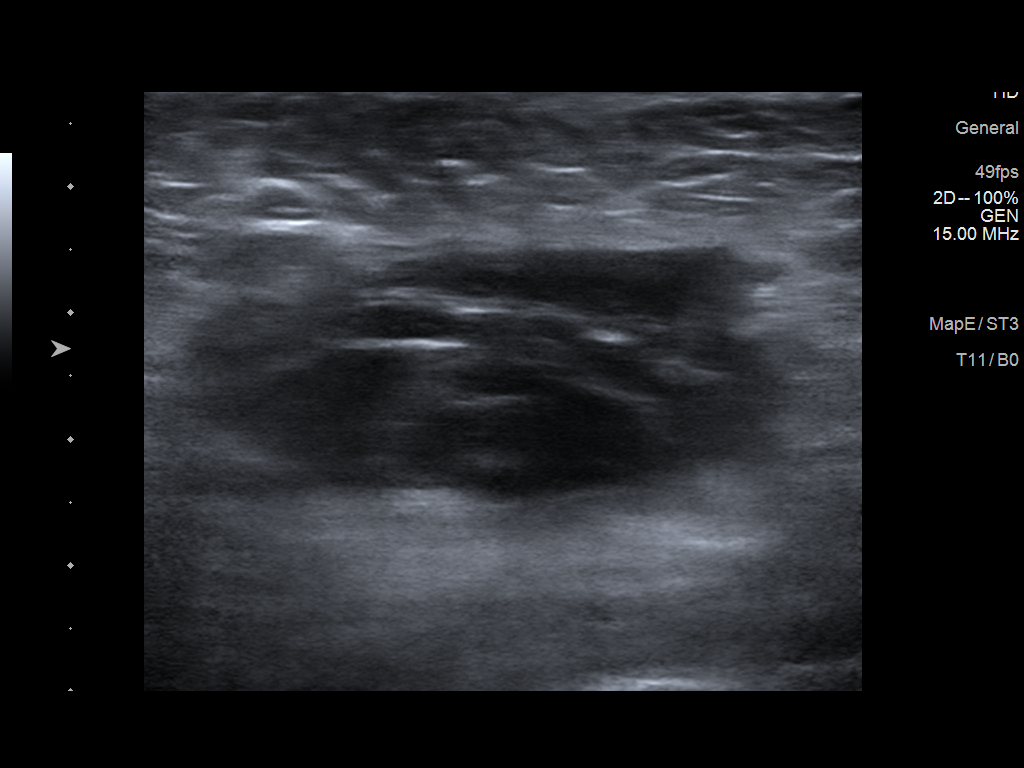

[5 of 5 positions shown; findings below may reference images not displayed]

PROCEDURE:
The patient and I discussed the procedure of ultrasound-guided
aspiration including benefits and alternatives. We discussed the
high likelihood of a successful procedure. We discussed the risks of
the procedure including infection, bleeding, tissue injury, and
inadequate sampling. Informed written consent was given. The usual
time out protocol was performed immediately prior to the procedure.

Using sterile technique, 1% lidocaine, under direct ultrasound
visualization, needle aspiration of the left breast/low axillary
seroma was performed. Approximately 30 cc of straw-colored fluid was
aspirated. A sample was sent for microbiology.
IMPRESSION: Ultrasound-guided aspiration of a left breast/low axillary seroma.
No apparent complications.

RECOMMENDATIONS:
Pending laboratory results.

ADDENDUM:
LEFT breast aspiration yielded Gram Stain: NO WBC SEEN NO ORGANISMS
SEEN. Culture: RARE NORMAL SKIN TIGER NO ANAEROBES ISOLATED.

Dr. TIGER notified the patient of results on [DATE] and her questions were answered. She will continue course of
amoxicillin-clavulanate (AUGMENTIN) until completed. The patient
reported decreased pain and decreased swelling. She was instructed
to call Dr. TIGER for any additional questions or concerns.

Pathology results reported by TIGER RN on [DATE].

*** End of Addendum ***
PROCEDURE:
The patient and I discussed the procedure of ultrasound-guided
aspiration including benefits and alternatives. We discussed the
high likelihood of a successful procedure. We discussed the risks of
the procedure including infection, bleeding, tissue injury, and
inadequate sampling. Informed written consent was given. The usual
time out protocol was performed immediately prior to the procedure.

Using sterile technique, 1% lidocaine, under direct ultrasound
visualization, needle aspiration of the left breast/low axillary
seroma was performed. Approximately 30 cc of straw-colored fluid was
aspirated. A sample was sent for microbiology.
IMPRESSION: Ultrasound-guided aspiration of a left breast/low axillary seroma.
No apparent complications.

RECOMMENDATIONS:
Pending laboratory results.

## 2021-03-25 DIAGNOSIS — Z170 Estrogen receptor positive status [ER+]: Secondary | ICD-10-CM | POA: Diagnosis not present

## 2021-03-25 DIAGNOSIS — C50412 Malignant neoplasm of upper-outer quadrant of left female breast: Secondary | ICD-10-CM | POA: Diagnosis not present

## 2021-03-25 DIAGNOSIS — Z20822 Contact with and (suspected) exposure to covid-19: Secondary | ICD-10-CM | POA: Diagnosis not present

## 2021-03-26 LAB — AEROBIC/ANAEROBIC CULTURE W GRAM STAIN (SURGICAL/DEEP WOUND)
Culture: NORMAL
Gram Stain: NONE SEEN

## 2021-03-31 ENCOUNTER — Ambulatory Visit
Admission: RE | Admit: 2021-03-31 | Discharge: 2021-03-31 | Disposition: A | Discharge status: Home / Self Care | Payer: BC Managed Care – PPO | Source: Ambulatory Visit | Attending: Radiation Oncology | Admitting: Radiation Oncology

## 2021-03-31 ENCOUNTER — Ambulatory Visit: Payer: BC Managed Care – PPO

## 2021-04-02 ENCOUNTER — Ambulatory Visit: Payer: BC Managed Care – PPO | Admitting: Radiation Oncology

## 2021-04-02 ENCOUNTER — Encounter: Payer: Self-pay | Admitting: Hematology and Oncology

## 2021-04-02 ENCOUNTER — Other Ambulatory Visit: Payer: Self-pay | Admitting: General Surgery

## 2021-04-02 DIAGNOSIS — C50912 Malignant neoplasm of unspecified site of left female breast: Secondary | ICD-10-CM

## 2021-04-03 ENCOUNTER — Other Ambulatory Visit: Payer: Self-pay

## 2021-04-03 ENCOUNTER — Inpatient Hospital Stay: Payer: BC Managed Care – PPO | Attending: Adult Health | Admitting: Hematology and Oncology

## 2021-04-03 DIAGNOSIS — C50412 Malignant neoplasm of upper-outer quadrant of left female breast: Secondary | ICD-10-CM | POA: Diagnosis not present

## 2021-04-03 DIAGNOSIS — G620 Drug-induced polyneuropathy: Secondary | ICD-10-CM | POA: Diagnosis not present

## 2021-04-03 DIAGNOSIS — Z170 Estrogen receptor positive status [ER+]: Secondary | ICD-10-CM | POA: Diagnosis not present

## 2021-04-03 DIAGNOSIS — Z79899 Other long term (current) drug therapy: Secondary | ICD-10-CM | POA: Insufficient documentation

## 2021-04-03 DIAGNOSIS — Z9221 Personal history of antineoplastic chemotherapy: Secondary | ICD-10-CM | POA: Insufficient documentation

## 2021-04-03 DIAGNOSIS — T451X5A Adverse effect of antineoplastic and immunosuppressive drugs, initial encounter: Secondary | ICD-10-CM | POA: Diagnosis not present

## 2021-04-03 NOTE — Assessment & Plan Note (Signed)
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%, PR 95%, HER2 equivocal, FISH pending, Ki-67 20% T1CN1 stage IIa MammaPrint: High risk: Luminal type B, probability of path CR 6% with chemo and antiestrogen therapy predicted benefit of treatment at 5 years 94.6%, average 10-year risk of recurrence untreated: 29%  Treatment plan: 1.Neoadjuvant chemotherapy with dose dense Adriamycin and Cytoxan followed by Taxol weekly x12 completed 01/17/2021 2.02/26/2021 breast conserving surgery with sentinel lymph node and targeted node dissection,: Path CR 0/4 LN Neg 3.Adjuvant radiation therapy 4.Followed by adjuvant antiestrogen therapy probably with tamoxifen. ------------------------------------------------------------------------------------------------------------------------- Radiation discussion: I discussed with the patient that there is a role for radiation even if she does mastectomy.  Therefore she decided to proceed with the lumpectomy with Dr. Donne Hazel and subsequently received radiation.  Chemo induced peripheral neuropathy: Currently on gabapentin 6 mg at bedtime

## 2021-04-03 NOTE — Progress Notes (Signed)
Patient Care Team: [name], MD as PCP - General (Family Medicine)  DIAGNOSIS:  Encounter Diagnosis  Name Primary?   Malignant neoplasm of upper-outer quadrant of left breast in female, estrogen receptor positive ([place])     SUMMARY OF ONCOLOGIC HISTORY: Oncology History  Malignant neoplasm of upper-outer quadrant of left breast in female, estrogen receptor positive ([place])  08/21/2020 Initial Diagnosis   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 No pathogenic variants identified on the [facility] Breast Cancer STAT Panel + Multi-Cancer Panel. [facility] in [facility] called c.983C>T identified. The report date is 09/04/2020.   The STAT Breast cancer panel offered by [facility] includes sequencing and rearrangement analysis for the following 9 genes:  ATM, BRCA1, BRCA2, CDH1, CHEK2, PALB2, PTEN, STK11 and TP53.    The Multi-Cancer Panel offered by [facility] includes sequencing and/or deletion duplication testing of the following 85 genes: AIP, ALK, APC, ATM, AXIN2,BAP1,  BARD1, BLM, BMPR1A, BRCA1, BRCA2, BRIP1, CASR, CDC73, CDH1, CDK4, CDKN1B, CDKN1C, CDKN2A (p14ARF), CDKN2A (p16INK4a), CEBPA, CHEK2, CTNNA1, DICER1, DIS3L2, EGFR (c.2369C>T, p.Thr790Met variant only), EPCAM (Deletion/duplication testing only), FH, FLCN, GATA2, GPC3, GREM1 (Promoter region deletion/duplication testing only), HOXB13 (c.251G>A, p.Gly84Glu), HRAS, KIT, MAX,  MEN1, MET, MITF (c.952G>A, p.Glu318Lys variant only), MLH1, MSH2, MSH3, MSH6, MUTYH, NBN, NF1, NF2, NTHL1, PALB2, PDGFRA, PHOX2B, PMS2, POLD1, POLE, POT1, PRKAR1A, PTCH1, PTEN, RAD50, RAD51C, RAD51D, RB1, RECQL4, RET, RNF43, RUNX1, SDHAF2, SDHA (sequence changes only), SDHB, SDHC, SDHD, SMAD4, SMARCA4, SMARCB1, SMARCE1, STK11, SUFU, TERC, TERT, TMEM127, TP53, TSC1, TSC2, VHL, WRN and WT1.    09/06/2020 - 01/17/2021 Chemotherapy          02/26/2021 Surgery   Left lumpectomy: no residual carcinoma with margins and axillary lymph nodes also negative for carcinoma       CHIEF COMPLIANT: Discussion regarding radiation  INTERVAL HISTORY: Tracy Dyer is a 50 year old with above-mentioned history of Breast cancer treated with neoadjuvant chemotherapy with Adriamycin Cytoxan followed by Taxol and subsequently had a lumpectomy.  Because she had a complete response the area of involvement was not visible and she is going to need another lumpectomy.  She took a long time to heal and recover from the first surgery and biopsy reason for the delay.  She was contemplating on whether if she does a mastectomy she can avoid radiation therapy.  There is reason for today's visit to discuss the role of radiation in the setting of a complete pathologic response.   ALLERGIES:  is allergic to sulfa antibiotics and sulfamethoxazole.  MEDICATIONS:  Current Outpatient Medications  Medication Sig Dispense Refill   albuterol (VENTOLIN HFA) 108 (90 Base) MCG/ACT inhaler Inhale 1-2 puffs into the lungs every 4 (four) hours as needed for wheezing or shortness of breath. 8 g 2   clonazePAM (KLONOPIN) 1 MG tablet TAKE 1 TABLET BY MOUTH 2 TIMES DAILY AS NEEDED FOR ANXIETY. 60 tablet 1   fluticasone (FLONASE) 50 MCG/ACT nasal spray Place 1  spray into both nostrils daily as needed for allergies.     gabapentin (NEURONTIN) 300 MG capsule Take 2 capsules (600 mg total) by mouth at bedtime. 180 capsule 3    l-methylfolate-B6-B12 (METANX) 3-35-2 MG TABS tablet Take 1 tablet by mouth daily.     LORazepam (ATIVAN) 0.5 MG tablet TAKE 1 TABLET BY MOUTH TWICE A DAY FOR NAUSEA OR ANXIETY. DO NOT TAKE AT THE SAME TIME AS CLONAZEPAM 40 tablet 0   MAGNESIUM CITRATE PO Take 405 mg by mouth daily. 135 mg     Multiple Vitamins-Minerals (MULTIVITAMIN WITH MINERALS) tablet Take 1 tablet by mouth daily.     omeprazole (PRILOSEC) 20 MG capsule Take 1 capsule (20 mg total) by mouth daily. 30 capsule 3   sertraline (ZOLOFT) 100 MG tablet Take 100 mg by mouth at bedtime.     traMADol (ULTRAM) 50 MG tablet Take 1 tablet (50 mg total) by mouth every 6 (six) hours as needed. 10 tablet 0   traMADol (ULTRAM) 50 MG tablet Take 2 tablets (100 mg total) by mouth every 6 (six) hours as needed. 10 tablet 0   No current facility-administered medications for this visit.    PHYSICAL EXAMINATION: ECOG PERFORMANCE STATUS: 1 - Symptomatic but completely ambulatory  Vitals:   04/03/21 0956  BP: 116/63  Pulse: (!) 56  Resp: 16  Temp: 97.9 F (36.6 C)  SpO2: 99%   Filed Weights   04/03/21 0956  Weight: 198 lb 11.2 oz (90.1 kg)      LABORATORY DATA:  I have reviewed the data as listed CMP Latest Ref Rng & Units 01/17/2021 01/10/2021 01/03/2021  Glucose 70 - 99 mg/dL 97 108(H) 102(H)  BUN 6 - 20 mg/dL 9 9 11   Creatinine 0.44 - 1.00 mg/dL 0.82 0.75 0.81  Sodium 135 - 145 mmol/L 139 139 140  Potassium 3.5 - 5.1 mmol/L 3.9 3.7 3.9  Chloride 98 - 111 mmol/L 109 108 107  CO2 22 - 32 mmol/L 24 24 26   Calcium 8.9 - 10.3 mg/dL 8.8(L) 9.1 9.4  Total Protein 6.5 - 8.1 g/dL 6.2(L) 6.2(L) 6.1(L)  Total Bilirubin 0.3 - 1.2 mg/dL 0.4 0.3 0.3  Alkaline Phos 38 - 126 U/L 60 60 59  AST 15 - 41 U/L 25 26 27   ALT 0 - 44 U/L 31 31 30     Lab Results  Component Value Date   WBC 3.2 (L) 01/17/2021   HGB 12.4 01/17/2021   HCT 35.8 (L) 01/17/2021   MCV 92.0 01/17/2021   PLT 214 01/17/2021   NEUTROABS 1.8 01/17/2021    ASSESSMENT &  PLAN:  Malignant neoplasm of upper-outer quadrant of left breast in female, estrogen receptor positive ([place]) 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  Neoadjuvant chemotherapy with dose dense Adriamycin and Cytoxan followed by Taxol weekly x12 completed 01/17/2021 2. 02/26/2021 breast conserving surgery with sentinel lymph node and targeted node dissection,: Path CR 0/4 LN Neg 3.  Adjuvant radiation  therapy 4.  Followed by adjuvant antiestrogen therapy probably with tamoxifen. ------------------------------------------------------------------------------------------------------------------------- Radiation discussion: I discussed with the patient that there is a role for radiation even if she does mastectomy.  Therefore she she can proceed with the lumpectomy with Dr. Donne Hazel   Subsequently she will need adjuvant radiation.  She understood the rationale and is willing to proceed with that plan.  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  No orders of the defined types were placed in this encounter.  The patient has a good understanding of the overall plan. she agrees with it. she will call with any problems that may develop before the next visit here. Total time spent: 30 mins including face to face time and  time spent for planning, charting and co-ordination of care   Harriette Ohara, MD 04/03/21

## 2021-04-04 ENCOUNTER — Telehealth: Payer: Self-pay | Admitting: Emergency Medicine

## 2021-04-04 ENCOUNTER — Telehealth: Payer: Self-pay | Admitting: Hematology and Oncology

## 2021-04-04 NOTE — Telephone Encounter (Signed)
[id number] - TREATMENT OF ESTABLISHED CHEMOTHERAPY-INDUCED NEUROPATHY WITH N-PALMITOYLETHANOLAMIDE, A CANNABIMIMETIC NUTRACEUTICAL: A RANDOMIZED DOUBLE-BLIND PHASE II PILOT TRIAL  Contacted pt to f/u on continued interest in trial, pt states she is still interested.  Pt will not be eligible until the end of Sept, and also needs to heal from her upcoming surgery.  Pt agreed to follow up by this Research Nurse in October regarding study, aware to call before then with any questions/concerns.  Wells Guiles 'Learta CoddingNeysa Bonito, RN, BSN Clinical Research Nurse I 04/04/21 4:04 PM

## 2021-04-04 NOTE — Telephone Encounter (Signed)
NO 9/1 LOS

## 2021-04-08 ENCOUNTER — Encounter: Payer: Self-pay | Admitting: *Deleted

## 2021-04-08 DIAGNOSIS — C50412 Malignant neoplasm of upper-outer quadrant of left female breast: Secondary | ICD-10-CM

## 2021-04-08 DIAGNOSIS — Z170 Estrogen receptor positive status [ER+]: Secondary | ICD-10-CM

## 2021-04-11 ENCOUNTER — Other Ambulatory Visit: Payer: Self-pay

## 2021-04-11 ENCOUNTER — Encounter (HOSPITAL_BASED_OUTPATIENT_CLINIC_OR_DEPARTMENT_OTHER): Payer: Self-pay | Admitting: General Surgery

## 2021-04-14 ENCOUNTER — Other Ambulatory Visit: Payer: Self-pay | Admitting: General Surgery

## 2021-04-14 ENCOUNTER — Encounter: Payer: Self-pay | Admitting: *Deleted

## 2021-04-14 DIAGNOSIS — R278 Other lack of coordination: Secondary | ICD-10-CM | POA: Diagnosis not present

## 2021-04-14 DIAGNOSIS — M47892 Other spondylosis, cervical region: Secondary | ICD-10-CM | POA: Diagnosis not present

## 2021-04-14 DIAGNOSIS — R519 Headache, unspecified: Secondary | ICD-10-CM | POA: Diagnosis not present

## 2021-04-14 DIAGNOSIS — H8113 Benign paroxysmal vertigo, bilateral: Secondary | ICD-10-CM | POA: Diagnosis not present

## 2021-04-14 DIAGNOSIS — C50912 Malignant neoplasm of unspecified site of left female breast: Secondary | ICD-10-CM

## 2021-04-14 DIAGNOSIS — R42 Dizziness and giddiness: Secondary | ICD-10-CM | POA: Diagnosis not present

## 2021-04-18 ENCOUNTER — Ambulatory Visit (HOSPITAL_BASED_OUTPATIENT_CLINIC_OR_DEPARTMENT_OTHER): Payer: BC Managed Care – PPO | Admitting: Anesthesiology

## 2021-04-18 ENCOUNTER — Encounter (HOSPITAL_BASED_OUTPATIENT_CLINIC_OR_DEPARTMENT_OTHER): Payer: Self-pay | Admitting: General Surgery

## 2021-04-18 ENCOUNTER — Encounter (HOSPITAL_BASED_OUTPATIENT_CLINIC_OR_DEPARTMENT_OTHER)
Admission: RE | Disposition: A | Discharge status: Home / Self Care | Payer: Self-pay | Source: Home / Self Care | Attending: General Surgery

## 2021-04-18 ENCOUNTER — Ambulatory Visit
Admission: RE | Admit: 2021-04-18 | Discharge: 2021-04-18 | Disposition: A | Discharge status: Home / Self Care | Payer: BC Managed Care – PPO | Source: Ambulatory Visit | Attending: General Surgery | Admitting: General Surgery

## 2021-04-18 ENCOUNTER — Other Ambulatory Visit: Payer: Self-pay

## 2021-04-18 ENCOUNTER — Ambulatory Visit (HOSPITAL_COMMUNITY)
Admission: RE | Admit: 2021-04-18 | Discharge: 2021-04-18 | Disposition: A | Discharge status: Home / Self Care | Payer: BC Managed Care – PPO | Attending: General Surgery | Admitting: General Surgery

## 2021-04-18 DIAGNOSIS — C50412 Malignant neoplasm of upper-outer quadrant of left female breast: Secondary | ICD-10-CM | POA: Diagnosis not present

## 2021-04-18 DIAGNOSIS — K219 Gastro-esophageal reflux disease without esophagitis: Secondary | ICD-10-CM | POA: Diagnosis not present

## 2021-04-18 DIAGNOSIS — C50912 Malignant neoplasm of unspecified site of left female breast: Secondary | ICD-10-CM | POA: Diagnosis not present

## 2021-04-18 DIAGNOSIS — Z170 Estrogen receptor positive status [ER+]: Secondary | ICD-10-CM | POA: Diagnosis not present

## 2021-04-18 DIAGNOSIS — N62 Hypertrophy of breast: Secondary | ICD-10-CM | POA: Diagnosis not present

## 2021-04-18 DIAGNOSIS — C50312 Malignant neoplasm of lower-inner quadrant of left female breast: Secondary | ICD-10-CM | POA: Diagnosis not present

## 2021-04-18 DIAGNOSIS — Z79899 Other long term (current) drug therapy: Secondary | ICD-10-CM | POA: Diagnosis not present

## 2021-04-18 DIAGNOSIS — Z882 Allergy status to sulfonamides status: Secondary | ICD-10-CM | POA: Insufficient documentation

## 2021-04-18 DIAGNOSIS — Z452 Encounter for adjustment and management of vascular access device: Secondary | ICD-10-CM | POA: Diagnosis not present

## 2021-04-18 DIAGNOSIS — N6012 Diffuse cystic mastopathy of left breast: Secondary | ICD-10-CM | POA: Diagnosis not present

## 2021-04-18 DIAGNOSIS — N6011 Diffuse cystic mastopathy of right breast: Secondary | ICD-10-CM | POA: Insufficient documentation

## 2021-04-18 DIAGNOSIS — Z9221 Personal history of antineoplastic chemotherapy: Secondary | ICD-10-CM | POA: Diagnosis not present

## 2021-04-18 DIAGNOSIS — Z803 Family history of malignant neoplasm of breast: Secondary | ICD-10-CM | POA: Insufficient documentation

## 2021-04-18 DIAGNOSIS — F418 Other specified anxiety disorders: Secondary | ICD-10-CM | POA: Diagnosis not present

## 2021-04-18 DIAGNOSIS — G8918 Other acute postprocedural pain: Secondary | ICD-10-CM | POA: Diagnosis not present

## 2021-04-18 HISTORY — PX: PORT-A-CATH REMOVAL: SHX5289

## 2021-04-18 HISTORY — PX: BREAST REDUCTION SURGERY: SHX8

## 2021-04-18 HISTORY — PX: BREAST LUMPECTOMY WITH RADIOFREQUENCY TAG IDENTIFICATION: SHX6884

## 2021-04-18 LAB — POCT PREGNANCY, URINE: Preg Test, Ur: NEGATIVE

## 2021-04-18 SURGERY — BREAST LUMPECTOMY WITH RADIOFREQUENCY TAG IDENTIFICATION
Anesthesia: Regional | Site: Chest | Laterality: Right

## 2021-04-18 MED ORDER — ONDANSETRON HCL 4 MG/2ML IJ SOLN
INTRAMUSCULAR | Status: AC
  Filled 2021-04-18: qty 2

## 2021-04-18 MED ORDER — PHENYLEPHRINE 8 MG IN D5W 100 ML (0.08MG/ML) PREMIX OPTIME
INJECTION | INTRAVENOUS | Status: DC | PRN
  Administered 2021-04-18: 35 ug/min via INTRAVENOUS

## 2021-04-18 MED ORDER — MIDAZOLAM HCL 2 MG/2ML IJ SOLN
2.0000 mg | Freq: Once | INTRAMUSCULAR | Status: AC
  Administered 2021-04-18: 2 mg via INTRAVENOUS

## 2021-04-18 MED ORDER — HYDROMORPHONE HCL 1 MG/ML IJ SOLN
INTRAMUSCULAR | Status: AC
  Filled 2021-04-18: qty 0.5

## 2021-04-18 MED ORDER — HYDROMORPHONE HCL 1 MG/ML IJ SOLN
INTRAMUSCULAR | Status: AC
  Filled 2021-04-18: qty 1

## 2021-04-18 MED ORDER — FENTANYL CITRATE (PF) 100 MCG/2ML IJ SOLN
25.0000 ug | INTRAMUSCULAR | Status: DC | PRN
  Administered 2021-04-18 (×2): 50 ug via INTRAVENOUS

## 2021-04-18 MED ORDER — FENTANYL CITRATE (PF) 100 MCG/2ML IJ SOLN
INTRAMUSCULAR | Status: DC | PRN
  Administered 2021-04-18: 100 ug via INTRAVENOUS

## 2021-04-18 MED ORDER — OXYCODONE HCL 5 MG PO TABS
ORAL_TABLET | ORAL | Status: AC
  Filled 2021-04-18: qty 1

## 2021-04-18 MED ORDER — LIDOCAINE 2% (20 MG/ML) 5 ML SYRINGE
INTRAMUSCULAR | Status: AC
  Filled 2021-04-18: qty 5

## 2021-04-18 MED ORDER — CHLORHEXIDINE GLUCONATE CLOTH 2 % EX PADS: 6.0000 | MEDICATED_PAD | Freq: Once | CUTANEOUS | Status: DC

## 2021-04-18 MED ORDER — SUGAMMADEX SODIUM 200 MG/2ML IV SOLN
INTRAVENOUS | Status: DC | PRN
  Administered 2021-04-18: 200 mg via INTRAVENOUS

## 2021-04-18 MED ORDER — ROCURONIUM BROMIDE 10 MG/ML (PF) SYRINGE
PREFILLED_SYRINGE | INTRAVENOUS | Status: AC
  Filled 2021-04-18: qty 10

## 2021-04-18 MED ORDER — FENTANYL CITRATE (PF) 100 MCG/2ML IJ SOLN
100.0000 ug | Freq: Once | INTRAMUSCULAR | Status: AC
  Administered 2021-04-18: 100 ug via INTRAVENOUS

## 2021-04-18 MED ORDER — ACETAMINOPHEN 500 MG PO TABS
ORAL_TABLET | ORAL | Status: AC
  Filled 2021-04-18: qty 2

## 2021-04-18 MED ORDER — BUPIVACAINE HCL (PF) 0.5 % IJ SOLN
INTRAMUSCULAR | Status: DC | PRN
  Administered 2021-04-18: 40 mL

## 2021-04-18 MED ORDER — PROPOFOL 500 MG/50ML IV EMUL
INTRAVENOUS | Status: DC | PRN
  Administered 2021-04-18: 25 ug/kg/min via INTRAVENOUS

## 2021-04-18 MED ORDER — FENTANYL CITRATE (PF) 100 MCG/2ML IJ SOLN
INTRAMUSCULAR | Status: AC
  Filled 2021-04-18: qty 2

## 2021-04-18 MED ORDER — DEXAMETHASONE SODIUM PHOSPHATE 10 MG/ML IJ SOLN
INTRAMUSCULAR | Status: AC
  Filled 2021-04-18: qty 1

## 2021-04-18 MED ORDER — MIDAZOLAM HCL 2 MG/2ML IJ SOLN
INTRAMUSCULAR | Status: AC
  Filled 2021-04-18: qty 2

## 2021-04-18 MED ORDER — PHENYLEPHRINE HCL (PRESSORS) 10 MG/ML IV SOLN
INTRAVENOUS | Status: AC
  Filled 2021-04-18: qty 2

## 2021-04-18 MED ORDER — PROPOFOL 500 MG/50ML IV EMUL
INTRAVENOUS | Status: AC
  Filled 2021-04-18: qty 100

## 2021-04-18 MED ORDER — OXYCODONE HCL 5 MG PO TABS: 5.0000 mg | ORAL_TABLET | ORAL | 0 refills | Status: AC | PRN

## 2021-04-18 MED ORDER — PROPOFOL 10 MG/ML IV BOLUS
INTRAVENOUS | Status: DC | PRN
  Administered 2021-04-18: 180 mg via INTRAVENOUS

## 2021-04-18 MED ORDER — KETOROLAC TROMETHAMINE 15 MG/ML IJ SOLN
15.0000 mg | INTRAMUSCULAR | Status: AC
  Administered 2021-04-18: 15 mg via INTRAVENOUS

## 2021-04-18 MED ORDER — PROMETHAZINE HCL 25 MG/ML IJ SOLN: 6.2500 mg | INTRAMUSCULAR | Status: DC | PRN

## 2021-04-18 MED ORDER — LACTATED RINGERS IV SOLN: INTRAVENOUS | Status: DC

## 2021-04-18 MED ORDER — DEXAMETHASONE SODIUM PHOSPHATE 10 MG/ML IJ SOLN
INTRAMUSCULAR | Status: DC | PRN
  Administered 2021-04-18: 8 mg
  Administered 2021-04-18: 10 mg

## 2021-04-18 MED ORDER — CEFAZOLIN SODIUM-DEXTROSE 2-4 GM/100ML-% IV SOLN
2.0000 g | INTRAVENOUS | Status: AC
  Administered 2021-04-18: 2 g via INTRAVENOUS

## 2021-04-18 MED ORDER — DEXAMETHASONE SODIUM PHOSPHATE 4 MG/ML IJ SOLN
INTRAMUSCULAR | Status: DC | PRN
  Administered 2021-04-18: 10 mg via INTRAVENOUS

## 2021-04-18 MED ORDER — PROPOFOL 10 MG/ML IV BOLUS
INTRAVENOUS | Status: AC
  Filled 2021-04-18: qty 20

## 2021-04-18 MED ORDER — OXYCODONE HCL 5 MG/5ML PO SOLN: 5.0000 mg | Freq: Once | ORAL | Status: AC | PRN

## 2021-04-18 MED ORDER — ROCURONIUM BROMIDE 100 MG/10ML IV SOLN
INTRAVENOUS | Status: DC | PRN
  Administered 2021-04-18: 30 mg via INTRAVENOUS
  Administered 2021-04-18: 70 mg via INTRAVENOUS

## 2021-04-18 MED ORDER — ONDANSETRON HCL 4 MG/2ML IJ SOLN
INTRAMUSCULAR | Status: DC | PRN
  Administered 2021-04-18: 4 mg via INTRAVENOUS

## 2021-04-18 MED ORDER — OXYCODONE HCL 5 MG PO TABS
5.0000 mg | ORAL_TABLET | Freq: Once | ORAL | Status: AC | PRN
  Administered 2021-04-18: 5 mg via ORAL

## 2021-04-18 MED ORDER — 0.9 % SODIUM CHLORIDE (POUR BTL) OPTIME
TOPICAL | Status: DC | PRN
  Administered 2021-04-18: 150 mL

## 2021-04-18 MED ORDER — ENSURE PRE-SURGERY PO LIQD: 296.0000 mL | Freq: Once | ORAL | Status: DC

## 2021-04-18 MED ORDER — LIDOCAINE HCL (CARDIAC) PF 100 MG/5ML IV SOSY
PREFILLED_SYRINGE | INTRAVENOUS | Status: DC | PRN
  Administered 2021-04-18: 60 mg via INTRAVENOUS

## 2021-04-18 MED ORDER — CEFAZOLIN SODIUM-DEXTROSE 2-4 GM/100ML-% IV SOLN
INTRAVENOUS | Status: AC
  Filled 2021-04-18: qty 100

## 2021-04-18 MED ORDER — KETOROLAC TROMETHAMINE 15 MG/ML IJ SOLN
INTRAMUSCULAR | Status: AC
  Filled 2021-04-18: qty 1

## 2021-04-18 MED ORDER — HYDROMORPHONE HCL 1 MG/ML IJ SOLN
INTRAMUSCULAR | Status: DC | PRN
  Administered 2021-04-18: .5 mg via INTRAVENOUS

## 2021-04-18 MED ORDER — ACETAMINOPHEN 500 MG PO TABS
1000.0000 mg | ORAL_TABLET | ORAL | Status: AC
  Administered 2021-04-18: 1000 mg via ORAL

## 2021-04-18 SURGICAL SUPPLY — 82 items
ADH SKN CLS APL DERMABOND .7 (GAUZE/BANDAGES/DRESSINGS) ×3
APL PRP STRL LF DISP 70% ISPRP (MISCELLANEOUS) ×6
APPLIER CLIP 9.375 MED OPEN (MISCELLANEOUS)
APR CLP MED 9.3 20 MLT OPN (MISCELLANEOUS)
BINDER BREAST 3XL (GAUZE/BANDAGES/DRESSINGS) IMPLANT
BINDER BREAST LRG (GAUZE/BANDAGES/DRESSINGS) IMPLANT
BINDER BREAST MEDIUM (GAUZE/BANDAGES/DRESSINGS) IMPLANT
BINDER BREAST XLRG (GAUZE/BANDAGES/DRESSINGS) ×4 IMPLANT
BINDER BREAST XXLRG (GAUZE/BANDAGES/DRESSINGS) IMPLANT
BIOPATCH RED 1 DISK 7.0 (GAUZE/BANDAGES/DRESSINGS) ×8 IMPLANT
BLADE SURG 10 STRL SS (BLADE) ×16 IMPLANT
BLADE SURG 15 STRL LF DISP TIS (BLADE) ×3 IMPLANT
BLADE SURG 15 STRL SS (BLADE) ×4
BNDG GAUZE ELAST 4 BULKY (GAUZE/BANDAGES/DRESSINGS) ×8 IMPLANT
CANISTER SUCT 1200ML W/VALVE (MISCELLANEOUS) ×4 IMPLANT
CHLORAPREP W/TINT 26 (MISCELLANEOUS) ×8 IMPLANT
CLIP APPLIE 9.375 MED OPEN (MISCELLANEOUS) IMPLANT
CLIP TI MEDIUM 6 (CLIP) IMPLANT
CLIP TI WIDE RED SMALL 6 (CLIP) IMPLANT
COVER BACK TABLE 60X90IN (DRAPES) ×4 IMPLANT
COVER MAYO STAND STRL (DRAPES) ×4 IMPLANT
DECANTER SPIKE VIAL GLASS SM (MISCELLANEOUS) IMPLANT
DERMABOND ADVANCED (GAUZE/BANDAGES/DRESSINGS) ×1
DERMABOND ADVANCED .7 DNX12 (GAUZE/BANDAGES/DRESSINGS) ×3 IMPLANT
DRAIN CHANNEL 15F RND FF W/TCR (WOUND CARE) IMPLANT
DRAPE LAPAROSCOPIC ABDOMINAL (DRAPES) IMPLANT
DRAPE TOP ARMCOVERS (MISCELLANEOUS) ×4 IMPLANT
DRAPE U-SHAPE 76X120 STRL (DRAPES) ×4 IMPLANT
DRAPE UTILITY XL STRL (DRAPES) ×4 IMPLANT
DRSG PAD ABDOMINAL 8X10 ST (GAUZE/BANDAGES/DRESSINGS) ×8 IMPLANT
DRSG TEGADERM 4X4.75 (GAUZE/BANDAGES/DRESSINGS) ×4 IMPLANT
ELECT COATED BLADE 2.86 ST (ELECTRODE) ×4 IMPLANT
ELECT REM PT RETURN 9FT ADLT (ELECTROSURGICAL) ×4
ELECTRODE REM PT RTRN 9FT ADLT (ELECTROSURGICAL) ×3 IMPLANT
EVACUATOR SILICONE 100CC (DRAIN) IMPLANT
GAUZE SPONGE 4X4 12PLY STRL LF (GAUZE/BANDAGES/DRESSINGS) ×4 IMPLANT
GLOVE SURG ENC MOIS LTX SZ7 (GLOVE) ×4 IMPLANT
GLOVE SURG HYDRASOFT LTX SZ5.5 (GLOVE) ×8 IMPLANT
GLOVE SURG UNDER POLY LF SZ7.5 (GLOVE) ×4 IMPLANT
GOWN STRL REUS W/ TWL LRG LVL3 (GOWN DISPOSABLE) ×9 IMPLANT
GOWN STRL REUS W/TWL LRG LVL3 (GOWN DISPOSABLE) ×12
HEMOSTAT ARISTA ABSORB 3G PWDR (HEMOSTASIS) IMPLANT
KIT MARKER MARGIN INK (KITS) IMPLANT
MARKER SKIN DUAL TIP RULER LAB (MISCELLANEOUS) IMPLANT
NEEDLE HYPO 25X1 1.5 SAFETY (NEEDLE) ×4 IMPLANT
NS IRRIG 1000ML POUR BTL (IV SOLUTION) ×4 IMPLANT
PACK BASIN DAY SURGERY FS (CUSTOM PROCEDURE TRAY) ×4 IMPLANT
PENCIL SMOKE EVACUATOR (MISCELLANEOUS) ×4 IMPLANT
PIN SAFETY STERILE (MISCELLANEOUS) ×4 IMPLANT
RETRACTOR ONETRAX LX 90X20 (MISCELLANEOUS) IMPLANT
SCISSORS WIRE ANG 4 3/4 DISP (INSTRUMENTS) IMPLANT
SHEET MEDIUM DRAPE 40X70 STRL (DRAPES) ×8 IMPLANT
SLEEVE SCD COMPRESS KNEE MED (STOCKING) ×4 IMPLANT
SPONGE T-LAP 18X18 ~~LOC~~+RFID (SPONGE) ×16 IMPLANT
SPONGE T-LAP 4X18 ~~LOC~~+RFID (SPONGE) ×4 IMPLANT
STAPLER INSORB 30 2030 C-SECTI (MISCELLANEOUS) IMPLANT
STAPLER VISISTAT 35W (STAPLE) ×4 IMPLANT
STRIP CLOSURE SKIN 1/2X4 (GAUZE/BANDAGES/DRESSINGS) IMPLANT
SUT ETHILON 2 0 FS 18 (SUTURE) ×4 IMPLANT
SUT MNCRL AB 4-0 PS2 18 (SUTURE) ×16 IMPLANT
SUT MON AB 5-0 PS2 18 (SUTURE) IMPLANT
SUT PDS AB 2-0 CT2 27 (SUTURE) IMPLANT
SUT SILK 2 0 SH (SUTURE) ×4 IMPLANT
SUT VIC AB 2-0 SH 27 (SUTURE) ×4
SUT VIC AB 2-0 SH 27XBRD (SUTURE) ×3 IMPLANT
SUT VIC AB 3-0 PS1 18 (SUTURE) ×20
SUT VIC AB 3-0 PS1 18XBRD (SUTURE) ×15 IMPLANT
SUT VIC AB 3-0 SH 27 (SUTURE) ×4
SUT VIC AB 3-0 SH 27X BRD (SUTURE) ×3 IMPLANT
SUT VIC AB 5-0 PS2 18 (SUTURE) IMPLANT
SUT VICRYL 4-0 PS2 18IN ABS (SUTURE) ×8 IMPLANT
SUT VICRYL AB 3 0 TIES (SUTURE) IMPLANT
SYR BULB IRRIG 60ML STRL (SYRINGE) ×4 IMPLANT
SYR CONTROL 10ML LL (SYRINGE) ×4 IMPLANT
TAPE MEASURE VINYL STERILE (MISCELLANEOUS) IMPLANT
TOWEL GREEN STERILE FF (TOWEL DISPOSABLE) ×8 IMPLANT
TRAY FAXITRON CT DISP (TRAY / TRAY PROCEDURE) ×4 IMPLANT
TRAY FOLEY W/BAG SLVR 14FR LF (SET/KITS/TRAYS/PACK) IMPLANT
TUBE CONNECTING 20X1/4 (TUBING) ×4 IMPLANT
UNDERPAD 30X36 HEAVY ABSORB (UNDERPADS AND DIAPERS) ×8 IMPLANT
YANKAUER SUCT BULB TIP NO VENT (SUCTIONS) ×4 IMPLANT
pvc round drain ×8 IMPLANT

## 2021-04-18 NOTE — H&P (Signed)
Subjective:     Patient ID: Tracy Dyer is a 50 y.o. female.   HPI   Presents for oncoplastic reconstruction at time left wire guided excision.  Presented following screening MMG 2019 with left breast calcifications. Underwent biopsy at that time with UDH, fibrocystic changes. Follow up imaging 2020 stable. Underwent diagnositic MMG/US 08/2020 for palpable mass showing stable appearance of left breast calcifications. New mass noted 3 o'clock, 3 cmfn and mass in the 3 o'clock location 5 cmfn. Two enlarged left axillary LN noted. Biopsies labeled left breast 3 o clock, 3cmfn with IDC, ER/PR+, Her2-, left breast 3 o clock 5 cmfn benign, and LN + for IDC.   MRI demonstrated 2.5 cm mass in the upper outer left breast consistent with the biopsy-proven malignancy. A 5 mm mass in the upper outer left breast consistent with biopsy proven fibroadenoma present. A 6 mm mass in the lower inner left breast abutting the pectoralis muscle noted.  At least 4 abnormal left axillary LN noted, 1 with prior biopsy. Second look [place] completed with 7 mm mass in the left breast at 7 o'clock, 7 cmfn. Biopsy labeled left breast 7 o clock with IDC, ER/PR+, Her2- with LVI.   Bone scan 12/2020 negative. Mammaprint high risk. Completed neoadjuvant chemotherapy. Final MRI demonstrated resolution of the enhancing masses in the left UOQ and LIQ breast, no enlarged LN.  Underwent left lumpectomy SLN with final pathology complete pathologic response, 0/4 SLN. Initial biopsy clip not within specimen. Plan to localize with wire and complete excision at time of reconstruction.  Course post lumpectomy complicated by fever cellulitis that has since resolved.   Has had virtual visit with Dr. [place] Service and in person visit with Dr. Mercy Moore [facility] in [place].   Mother with breast ca. Mother had bilateral mastectomies with abdominal based reconstruction. Per discussion likely TRAM flaps, notes looked lie "pin cushion," developed back pain.  Aunt with ovarian ca.  Genetic testing 2022 following breast cancer diagnosis with VUS in [facility].   Current 38 DD. Wt up 20 lb over last year.    PMH significant for MTHFR gene mutation, history of stroke age 31 related to OCP use. 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 Lives with spouse who is a Youth worker, and two sons ages 83 and 25.   Review of Systems      Objective:   Physical Exam Cardiovascular:     Rate and Rhythm: Normal rate.  Normal heart sounds PULM: clear to auscultation    Pulses: Normal pulses.  Abdominal:     Palpations: Abdomen is soft.  Skin:    Comments: Fitzpatrick 2     Grade 3 ptosis bilateral Mass left breast 9 o clock SN to nipple R 31 L 30.5 cm BW R 22 L 23 cm Nipple to IMF R 10 L 10 cm      Assessment:     Left breast ca UOQ ER+ Neoadjuvant chemotherapy S/p left lumpectomy x 2 SLN    Plan:     Patient's ultimate goal is to have bilateral NSM with DIEP flap reconstruction. Plan at this time to pursue lumpectomy and staged oncoplastic reconstruction. Following completion RT, she plans to pursue NSM in future and eventual autologous reconstruction.   Per patient, discussed staged approach with NSM with nerve reconstruction and TE placement followed by DIEP at later stage. Per patient discussed oncoplastic reduction to be done my myself locally and Dr. Mercy Moore was fine with this.  Reviewed oncoplastic reconstruction with anchor type scars, drains, post operative visits and limitations, recovery. Diminished sensation nipple and breast skin, risk of nipple loss, wound healing problems, asymmetry. Discussed will have some contraction of breast volume and increased firmness with radiation, less ptosis with aging. This can result in asymmetries long term. Discussed changes with wt gain, loss, aging. Discussed lumpectomy alone can result in NAC displacement, distortion contour breast following lumpectomy and RT, asymmetry breast  volume and NAC position. Reviewed purpose of this type reconstruction to prevent these. Reviewed breast lift or trying to correct NAC displacement post RT more difficult, higher risk complications. Reviewed I cannot assure her cup size. Reviewed any complications from oncoplastic reconstruction procedure may delay start RT.

## 2021-04-18 NOTE — H&P (Signed)
Tracy Dyer is an 50 y.o. female.   Chief Complaint: breast cancer HPI: 48 yof s/p left lumpectomy times two and sn biopsy. She has retained clip at site where the localization was less than ideal. Pathology all so far shows complete path response. She has left axillary pain and swelling now. I sent her to radiology and they were able to aspirate 30 cc of straw-colored fluid that has not grown anything on culture. She has taken a COVID PCR and that is negative. I started her on doxycycline but changed this to Augmentin when she was not getting any better. Since she started the Augmentin a couple of days ago she feels significantly better. She is now able to move her arm just fine. The swelling is also better also. She has not had a fever. This has all resolved now.  I have dsicussed with radiology removing the clip and ended up deciding on mr wire placement along with reduction. She has cpr already and I expect this will be case.  She will get port out same time   Past Medical History:  Diagnosis Date   Anxiety    Cancer ([place])    breast   Depression    Family history of breast cancer    Family history of lung cancer    Family history of ovarian cancer    GERD (gastroesophageal reflux disease)    diet controlled   Seasonal allergies     Past Surgical History:  Procedure Laterality Date   BREAST BIOPSY Left 2019   x 2 biopsy   BREAST EXCISIONAL BIOPSY Left    BREAST LUMPECTOMY WITH RADIOACTIVE SEED AND SENTINEL LYMPH NODE BIOPSY Left 02/26/2021   Procedure: LEFT BREAST LUMPECTOMY WITH RADIOACTIVE SEED x2; LEFT AXILLARY SENTINEL NODE BIOPSY;  Surgeon: [name], MD;  Location: [place];  Service: General;  Laterality: Left;   CHOLECYSTECTOMY  2008   PORTACATH PLACEMENT N/A 09/05/2020   Procedure: INSERTION PORT-A-CATH;  Surgeon: [name], MD;  Location: [place];  Service: General;  Laterality: N/A;  PEC BLOCK   WISDOM TOOTH EXTRACTION       Family History  Problem Relation Age of Onset   Eczema Father    Allergic rhinitis Brother    Breast cancer Mother        in 67's   Cancer Maternal Uncle        unk type   Lung cancer Maternal Grandfather 42   Heart Problems Paternal Grandfather    Melanoma Cousin    Ovarian cancer Paternal Great-grandmother    Cancer Paternal Great-grandmother        GYN cancer, possibly ovarian?   Cancer Maternal Aunt        half aunts/uncles x5- rare cancers   Social History:  reports that she has never smoked. She has never used smokeless tobacco. She reports that she does not currently use alcohol. She reports that she does not use drugs.  Allergies:  Allergies  Allergen Reactions   Sulfa Antibiotics     Other reaction(s): Rash, urticarial   Sulfamethoxazole Rash    Feel sun burn    Medications Prior to Admission  Medication Sig Dispense Refill   clonazePAM (KLONOPIN) 1 MG tablet TAKE 1 TABLET BY MOUTH 2 TIMES DAILY AS NEEDED FOR ANXIETY. 60 tablet 1   fluticasone (FLONASE) 50 MCG/ACT nasal spray Place 1 spray into both nostrils daily as needed for allergies.     gabapentin (NEURONTIN) 300 MG capsule Take  2 capsules (600 mg total) by mouth at bedtime. 180 capsule 3   l-methylfolate-B6-B12 (METANX) 3-35-2 MG TABS tablet Take 1 tablet by mouth daily.     LORazepam (ATIVAN) 0.5 MG tablet TAKE 1 TABLET BY MOUTH TWICE A DAY FOR NAUSEA OR ANXIETY. DO NOT TAKE AT THE SAME TIME AS CLONAZEPAM 40 tablet 0   MAGNESIUM CITRATE PO Take 405 mg by mouth daily. 135 mg     pseudoephedrine (SUDAFED) 30 MG tablet Take 30 mg by mouth one time in dialysis.     sertraline (ZOLOFT) 100 MG tablet Take 100 mg by mouth at bedtime.     albuterol (VENTOLIN HFA) 108 (90 Base) MCG/ACT inhaler Inhale 1-2 puffs into the lungs every 4 (four) hours as needed for wheezing or shortness of breath. 8 g 2   Multiple Vitamins-Minerals (MULTIVITAMIN WITH MINERALS) tablet Take 1 tablet by mouth daily.     omeprazole  (PRILOSEC) 20 MG capsule Take 1 capsule (20 mg total) by mouth daily. 30 capsule 3   traMADol (ULTRAM) 50 MG tablet Take 2 tablets (100 mg total) by mouth every 6 (six) hours as needed. 10 tablet 0    Results for orders placed or performed during the hospital encounter of 04/18/21 (from the past 48 hour(s))  Pregnancy, urine POC     Status: None   Collection Time: 04/18/21 10:31 AM  Result Value Ref Range   Preg Test, Ur NEGATIVE NEGATIVE    Comment:        THE SENSITIVITY OF THIS METHODOLOGY IS >24 mIU/mL    No results found.  Review of Systems  All other systems reviewed and are negative.  Blood pressure 115/69, pulse (!) 56, temperature 97.8 F (36.6 C), temperature source Oral, resp. rate 16, height '5\' 6"'$  (1.676 m), weight 89.5 kg, SpO2 97 %. Physical Exam  General nad Left breast well healed incisions, no more infection No ax adenopathy Lungs effort normal  Assessment/Plan  Left breast wire guided clip removal Port removal  I discussed with her that we are able to do the MRI guided wire placement and the surgery now.     [name], MD 04/18/2021, 10:57 AM

## 2021-04-18 NOTE — Anesthesia Postprocedure Evaluation (Signed)
Anesthesia Post Note  Patient: Tracy Dyer  Procedure(s) Performed: LEFT BREAST MRI WIRE GUIDED EXCISION (Left: Breast) REMOVAL PORT-A-CATH (Right: Chest) MAMMARY REDUCTION  (BREAST) LEFT ONCOPLASTIC BREAST RECONSTRUCTION, RIGHT BREAST REDUCTION (Bilateral: Breast)     Patient location during evaluation: PACU Anesthesia Type: Regional and General Level of consciousness: awake and alert Pain management: pain level controlled Vital Signs Assessment: post-procedure vital signs reviewed and stable Respiratory status: spontaneous breathing, nonlabored ventilation, respiratory function stable and patient connected to nasal cannula oxygen Cardiovascular status: blood pressure returned to baseline and stable Postop Assessment: no apparent nausea or vomiting Anesthetic complications: no   No notable events documented.  Last Vitals:  Vitals:   04/18/21 1645 04/18/21 1700  BP: 122/68 123/67  Pulse: 70 62  Resp: 17 13  Temp:    SpO2: 95% 95%    Last Pain:  Vitals:   04/18/21 1700  TempSrc:   PainSc: 4                  Makaria Poarch P Rudy Domek

## 2021-04-18 NOTE — Op Note (Signed)
Preoperative diagnosis: Status post left breast lumpectomy after primary chemotherapy with retained clip Postoperative diagnosis: Same as above Procedure: Left breast wire-guided lumpectomy Port removal Surgeon: Dr. Serita Grammes Anesthesia: General Estimated blood loss: Minimal Complications: None Specimens: 1.  Left breast tissue marked with paint containing wire with additional unoriented specimen containing the clip Case turned over to plastic surgery completion  Indications: This is a 50 year old female who underwent primary chemotherapy.  She underwent lumpectomies with a complete pathologic response as well as node biopsy.  She had a retained clip due to difficulty positioning a radioactive seed at the time of the first surgery.  I have discussed with radiology and she has an MRI guided placement of a wire at this point.  I will also remove her port.  Procedure: After informed consent was obtained she was taken to the operating.  She was given antibiotics SCDs were in place.  She was placed under anesthesia without complication.  She was prepped and draped in the standard sterile surgical fashion.  A surgical timeout was then performed.  I made an incision in the lower pole of the left breast.  The clip was not exactly where my prior lumpectomy was with a radioactive seed guidance.  I pulled the wire in.  I then tracked the wire to the and the removed all the tissue posterior to this.  Initially t the clip was not in the specimen.  I did remove some additional tissue medial to that and the clip was present in that specimen.  This was right on the muscle.  All of the actual margins of that small piece of tissue are clear as it was in my prior lumpectomy cavity as well as this cavity.  I then packed this.  Plastic surgery continued with their portion of the operation.  I made incision in her right chest where her port was located.  I remove the port and the line in their entirety.  I remove  the suture material.  I closed this with 3-0 Vicryl and 4-0 Monocryl.  The case was turned over to plastic surgery.

## 2021-04-18 NOTE — Discharge Instructions (Addendum)
Oxycodone '5mg'$  given at 5:30pm.   Post Anesthesia Home Care Instructions  Activity: Get plenty of rest for the remainder of the day. A responsible individual must stay with you for 24 hours following the procedure.  For the next 24 hours, DO NOT: -Drive a car -Paediatric nurse -Drink alcoholic beverages -Take any medication unless instructed by your physician -Make any legal decisions or sign important papers.  Meals: Start with liquid foods such as gelatin or soup. Progress to regular foods as tolerated. Avoid greasy, spicy, heavy foods. If nausea and/or vomiting occur, drink only clear liquids until the nausea and/or vomiting subsides. Call your physician if vomiting continues.  Special Instructions/Symptoms: Your throat may feel dry or sore from the anesthesia or the breathing tube placed in your throat during surgery. If this causes discomfort, gargle with warm salt water. The discomfort should disappear within 24 hours.  If you had a scopolamine patch placed behind your ear for the management of post- operative nausea and/or vomiting:  1. The medication in the patch is effective for 72 hours, after which it should be removed.  Wrap patch in a tissue and discard in the trash. Wash hands thoroughly with soap and water. 2. You may remove the patch earlier than 72 hours if you experience unpleasant side effects which may include dry mouth, dizziness or visual disturbances. 3. Avoid touching the patch. Wash your hands with soap and water after contact with the patch.       JP Drain [employer] this sheet to all of your post-operative appointments while you have your drains. Please measure your drains by CC's or ML's. Make sure you drain and measure your JP Drains 2 or 3 times per day. At the end of each day, add up totals for the left side and add up totals for the right side.    ( 9 am )     ( 3 pm )        ( 9 pm )                Date L  R  L  R  L  R  Total L/R

## 2021-04-18 NOTE — Interval H&P Note (Signed)
History and Physical Interval Note:  04/18/2021 11:37 AM  Tracy Dyer  has presented today for surgery, with the diagnosis of left breast cancer.  The various methods of treatment have been discussed with the patient and family. After consideration of risks, benefits and other options for treatment, the patient has consented to  Procedure(s): LEFT BREAST MRI WIRE GUIDED EXCISION (Left) MAMMARY REDUCTION  (BREAST) LEFT ONCOPLASTIC BREAST RECONSTRUCTION, RIGHT BREAST RECONSTRUCTION (Bilateral) as a surgical intervention.  The patient's history has been reviewed, patient examined, no change in status, stable for surgery.  I have reviewed the patient's chart and labs.  Questions were answered to the patient's satisfaction.     [name]

## 2021-04-18 NOTE — Progress Notes (Signed)
Assisted Dr. Jana Half with right, left, ultrasound guided, pectoralis block. Side rails up, monitors on throughout procedure. See vital signs in flow sheet. Tolerated Procedure well.

## 2021-04-18 NOTE — Anesthesia Procedure Notes (Signed)
Anesthesia Regional Block: Pectoralis block   Pre-Anesthetic Checklist: , timeout performed,  Correct Patient, Correct Site, Correct Laterality,  Correct Procedure, Correct Position, site marked,  Risks and benefits discussed,  Surgical consent,  Pre-op evaluation,  At surgeon's request and post-op pain management  Laterality: Left  Prep: Dura Prep       Needles:  Injection technique: Single-shot  Needle Type: Echogenic Stimulator Needle     Needle Length: 5cm  Needle Gauge: 20     Additional Needles:   Procedures:,,,, ultrasound used (permanent image in chart),,    Narrative:  Start time: 04/18/2021 11:35 AM End time: 04/18/2021 11:39 AM Injection made incrementally with aspirations every 5 mL.  Performed by: Personally  Anesthesiologist: Darral Dash, DO  Additional Notes: Patient identified. Risks/Benefits/Options discussed with patient including but not limited to bleeding, infection, nerve damage, failed block, incomplete pain control. Patient expressed understanding and wished to proceed. All questions were answered. Sterile technique was used throughout the entire procedure. Please see nursing notes for vital signs. Aspirated in 5cc intervals with injection for negative confirmation. Patient was given instructions on fall risk and not to get out of bed. All questions and concerns addressed with instructions to call with any issues or inadequate analgesia.

## 2021-04-18 NOTE — Op Note (Signed)
Operative Note   DATE OF OPERATION: 9.16.22  LOCATION: [facility]-outpatient  SURGICAL DIVISION: Plastic Surgery  PREOPERATIVE DIAGNOSES:  1. Left breast cancer ER+  POSTOPERATIVE DIAGNOSES:  same  PROCEDURE:  Left oncoplastic breast reconstruction, right breast reduction  SURGEON: Irene Limbo MD MBA  ASSISTANT: none  ANESTHESIA:  General.   EBL: 100 ml for entire procedure  COMPLICATIONS: None immediate.   INDICATIONS FOR PROCEDURE:  The patient, Tracy Dyer, is a 50 y.o. female born on 1970-09-13, is here for oncoplastic breast reduction following left lumpectomy. Patient has initial biopsy clip still in place and plan additional lumpectomy over left for removal of this.   FINDINGS: Right reduction 164 g Left reduction 130 g Left lumpectomy 25 g  DESCRIPTION OF PROCEDURE:  The patient was marked standing in the preoperative area to mark sternal notch, chest midline, anterior axillary lines, inframammary folds. The location of new nipple areolar complex was marked at level of on inframammary fold on anterior surface breast by palpation. This was marked symmetric over bilateral breasts. With aid of Wise pattern marker, location of new nipple areolar complex and vertical limbs (7 cm) were marked by displacement of breasts along meridian. The patient was taken to the operating room. SCDs were placed and IV antibiotics were given. The patient's operative site was prepped and draped in a sterile fashion. A time out was performed and all information was confirmed to be correct.   Following completion lumpectomy, ver left breast, superomedial pedicle marked and nipple areolar complex incised with 45 mm diameter marker. Pedicle deepithlialized and developed to chest wall. Breast tissue resected over lower pole. Medial and lateral flaps developed. Breast tailor tacked closed.    I then directed attention to right breast where superior medial pedicle designed. NAC marked with  45 mm diameter marker. The pedicle was deepithelialized. Pedicle developed to chest wall. Breast tissue resected over lower pole. Medial and lateral flaps developed. Additional lateral chest wall tissue excised. Breast cavities irrigated and hemostasis obtained. Local anesthetic infiltrated throughout each breast. 15 Fr JP placed in each breast and secured with 2-0 nylon. Closure completed bilateral with 3-0 vicryl to approximate dermis along inframammary fold and vertical limb. NAC inset with 4-0 vicryl in dermis. Skin closure completed with 4-0 monocryl subcuticular throughout. Tissue adhesive applied. Dry dressing and breast binder applied.  The patient was allowed to wake from anesthesia, extubated and taken to the recovery room in satisfactory condition.   SPECIMENS: right and left breast reduction  DRAINS: 15 Fr JP in right and left breast

## 2021-04-18 NOTE — Anesthesia Procedure Notes (Signed)
Procedure Name: Intubation Date/Time: 04/18/2021 12:21 PM Performed by: Ezequiel Kayser, CRNA Pre-anesthesia Checklist: Patient identified, Emergency Drugs available, Suction available and Patient being monitored Patient Re-evaluated:Patient Re-evaluated prior to induction Oxygen Delivery Method: Circle System Utilized Preoxygenation: Pre-oxygenation with 100% oxygen Induction Type: IV induction Ventilation: Mask ventilation without difficulty Laryngoscope Size: Mac and 4 Grade View: Grade I Tube type: Oral Tube size: 7.0 mm Number of attempts: 1 Airway Equipment and Method: Stylet and Oral airway Placement Confirmation: ETT inserted through vocal cords under direct vision, positive ETCO2 and breath sounds checked- equal and bilateral Secured at: 22 cm Tube secured with: Tape Dental Injury: Teeth and Oropharynx as per pre-operative assessment

## 2021-04-18 NOTE — Transfer of Care (Signed)
Immediate Anesthesia Transfer of Care Note  Patient: Tracy Dyer  Procedure(s) Performed: LEFT BREAST MRI WIRE GUIDED EXCISION (Left: Breast) REMOVAL PORT-A-CATH (Right: Chest) MAMMARY REDUCTION  (BREAST) LEFT ONCOPLASTIC BREAST RECONSTRUCTION, RIGHT BREAST REDUCTION (Bilateral: Breast)  Patient Location: PACU  Anesthesia Type:General and Regional  Level of Consciousness: awake, alert  and oriented  Airway & Oxygen Therapy: Patient Spontanous Breathing and Patient connected to face mask oxygen  Post-op Assessment: Report given to RN and Post -op Vital signs reviewed and stable  Post vital signs: Reviewed and stable  Last Vitals:  Vitals Value Taken Time  BP 126/65 04/18/21 1532  Temp    Pulse 78 04/18/21 1533  Resp    SpO2 97 % 04/18/21 1533  Vitals shown include unvalidated device data.  Last Pain:  Vitals:   04/18/21 1038  TempSrc: Oral  PainSc: 0-No pain         Complications: No notable events documented.

## 2021-04-18 NOTE — Anesthesia Preprocedure Evaluation (Addendum)
Anesthesia Evaluation  Patient identified by MRN, date of birth, ID band Patient awake    Reviewed: Allergy & Precautions, NPO status , Patient's Chart, lab work & pertinent test results  Airway Mallampati: II  TM Distance: >3 FB Neck ROM: Full    Dental  (+) Teeth Intact   Pulmonary neg pulmonary ROS,    Pulmonary exam normal        Cardiovascular negative cardio ROS   Rhythm:Regular Rate:Normal     Neuro/Psych Anxiety Depression TIA   GI/Hepatic Neg liver ROS, GERD  Medicated,  Endo/Other  negative endocrine ROS  Renal/GU negative Renal ROS  negative genitourinary   Musculoskeletal Left breast cancer    Abdominal (+)  Abdomen: soft. Bowel sounds: normal.  Peds  Hematology negative hematology ROS (+)   Anesthesia Other Findings   Reproductive/Obstetrics                            Anesthesia Physical Anesthesia Plan  ASA: 3  Anesthesia Plan: General and Regional   Post-op Pain Management:  Regional for Post-op pain   Induction: Intravenous  PONV Risk Score and Plan: 3 and Ondansetron, Dexamethasone, Midazolam and Treatment may vary due to age or medical condition  Airway Management Planned: Mask and Oral ETT  Additional Equipment: None  Intra-op Plan:   Post-operative Plan: Extubation in OR  Informed Consent: I have reviewed the patients History and Physical, chart, labs and discussed the procedure including the risks, benefits and alternatives for the proposed anesthesia with the patient or authorized representative who has indicated his/her understanding and acceptance.     Dental advisory given  Plan Discussed with: CRNA  Anesthesia Plan Comments:        Anesthesia Quick Evaluation

## 2021-04-18 NOTE — Anesthesia Procedure Notes (Signed)
Anesthesia Regional Block: Pectoralis block   Pre-Anesthetic Checklist: , timeout performed,  Correct Patient, Correct Site, Correct Laterality,  Correct Procedure, Correct Position, site marked,  Risks and benefits discussed,  Surgical consent,  Pre-op evaluation,  At surgeon's request and post-op pain management  Laterality: Right  Prep: Dura Prep       Needles:  Injection technique: Single-shot  Needle Type: Echogenic Stimulator Needle     Needle Length: 5cm  Needle Gauge: 20     Additional Needles:   Procedures:,,,, ultrasound used (permanent image in chart),,    Narrative:  Start time: 04/18/2021 11:40 AM End time: 04/18/2021 11:43 AM Injection made incrementally with aspirations every 5 mL.  Performed by: Personally  Anesthesiologist: Darral Dash, DO  Additional Notes: Patient identified. Risks/Benefits/Options discussed with patient including but not limited to bleeding, infection, nerve damage, failed block, incomplete pain control. Patient expressed understanding and wished to proceed. All questions were answered. Sterile technique was used throughout the entire procedure. Please see nursing notes for vital signs. Aspirated in 5cc intervals with injection for negative confirmation. Patient was given instructions on fall risk and not to get out of bed. All questions and concerns addressed with instructions to call with any issues or inadequate analgesia.

## 2021-04-21 ENCOUNTER — Encounter (HOSPITAL_BASED_OUTPATIENT_CLINIC_OR_DEPARTMENT_OTHER): Payer: Self-pay | Admitting: General Surgery

## 2021-04-23 ENCOUNTER — Encounter: Payer: Self-pay | Admitting: *Deleted

## 2021-04-23 LAB — SURGICAL PATHOLOGY

## 2021-05-08 ENCOUNTER — Other Ambulatory Visit: Payer: Self-pay | Admitting: Hematology and Oncology

## 2021-05-08 DIAGNOSIS — R110 Nausea: Secondary | ICD-10-CM

## 2021-05-08 DIAGNOSIS — F419 Anxiety disorder, unspecified: Secondary | ICD-10-CM

## 2021-05-09 NOTE — Telephone Encounter (Signed)
Pt is requesting refill for Klonopin and Ativan.  Was not sure if you wanted to continue ordering both.

## 2021-05-12 ENCOUNTER — Encounter: Payer: Self-pay | Admitting: Radiation Oncology

## 2021-05-14 NOTE — Progress Notes (Signed)
Location of Breast Cancer: Malignant neoplasm of upper-outer quadrant of left breast in female, estrogen receptor positive      Histology per Pathology Report:08/14/2020 1. FLUORESCENCE IN-SITU HYBRIDIZATION Results: GROUP 5: HER2 **NEGATIVE** Equivocal form of amplification of the HER2 gene was detected in the IHC 2+ tissue sample received from this individual. HER2 FISH was performed by a technologist and cell imaging and analysis on the [employer]. RATIO OF HER2/CEN17 SIGNALS 1.47 AVERAGE HER2 COPY NUMBER PER CELL 3.30 The ratio of HER2/CEN 17 is within the range < 2.0 of HER2/CEN 17 and a copy number of HER2 signals per cell is <4.0. Arch Pathol Lab Med 1:1,2018 Gillie Manners MD Pathologist, Electronic Signature ( Signed 08/21/2020) 1. PROGNOSTIC INDICATORS Results: IMMUNOHISTOCHEMICAL AND MORPHOMETRIC ANALYSIS PERFORMED MANUALLY The tumor cells are EQUIVOCAL for Her2 (2+). HER2 by FISH will be performed and the results reported separately Estrogen Receptor: 95%, POSITIVE, STRONG STAINING INTENSITY Progesterone Receptor: 95%, POSITIVE, STRONG STAINING INTENSITY Proliferation Marker Ki67: 20% REFERENCE RANGE ESTROGEN RECEPTOR NEGATIVE 0% POSITIVE =>1% REFERENCE RANGE PROGESTERONE RECEPTOR 1 of 3 FINAL for Dyer, Caileen Tracy ([id number]) ADDITIONAL INFORMATION:(continued) NEGATIVE 0% POSITIVE =>1% All controls stained appropriately Vicente Males MD Pathologist, Electronic Signature ( Signed 08/16/2020) FINAL DIAGNOSIS Diagnosis 1. Breast, left, needle core biopsy, 3 o'clock, 3cmfn, coil clip - INVASIVE DUCTAL CARCINOMA, SEE NOTE 2. Breast, left, needle core biopsy, 3 o'clock, 5cmfn, heart clip - FIBROADENOMA WITH USUAL DUCTAL HYPERPLASIA. SEE NOTE - NEGATIVE FOR CARCINOMA 3. Lymph node, needle/core biopsy, left axilla, tribell clip - INVASIVE DUCTAL CARCINOMA, SEE NOTE Diagnosis Note 1. Carcinoma measures 1.0 cm in greatest linear dimension and appears grade 3. Dr.  Tresa Moore reviewed the case and concurs with the diagnosis. A breast prognostic profile (ER, PR, Ki-67 and HER2) is pending and will be reported in an addendum. The [facility] was notified on 08/15/2020. 3. Lymph node tissue is identified. Findings may represent an entirely replaced lymph node. Jaquita Folds MD Pathologist, Electronic 08/14/2020 Diagnosis 1. Breast, left, needle core biopsy, 3 o'clock, 3cmfn, coil clip - INVASIVE DUCTAL CARCINOMA, SEE NOTE 2. Breast, left, needle core biopsy, 3 o'clock, 5cmfn, heart clip - FIBROADENOMA WITH USUAL DUCTAL HYPERPLASIA. SEE NOTE - NEGATIVE FOR CARCINOMA 3. Lymph node, needle/core biopsy, left axilla, tribell clip - INVASIVE DUCTAL CARCINOMA, SEE NOTE     Receptor Status: ER(95t), PR (95t), Her2-neu (2t), Ki-(20%)   Did patient present with symptoms (if so, please note symptoms) or was this found on screening mammography?: routine screening mammogram that detected punctate calcifications and by ultrasound there was a 1.8 cm mass in the upper outer quadrant of the left breast.   Past/Anticipated interventions by surgeon, if any: 02/26/2021  Procedure:  1. Left breast radioactive seed lumpectomy times two 2. Left deep axillary sentinel lymph node biopsy 3. Injection of blue dye for sentinel node identification Surgeon: Dr. Serita Grammes   Past/Anticipated interventions by medical oncology, if any: Chemotherapy Dr Lindi Adie treated with neoadjuvant chemotherapy with Adriamycin Cytoxan followed by Taxol and subsequently had a lumpectomy.    Lymphedema issues, if any:     Pain issues, if any:  denies   SAFETY ISSUES: Prior radiation?  no Pacemaker/ICD? no Possible current pregnancy? No Is the patient on methotrexate? no   Current Complaints / other details: none  Vitals:   05/19/21 1000  BP: 108/68  Pulse: (!) 54  Resp: 16  Temp: (!) 97.5 F (36.4 C)  TempSrc: Temporal  SpO2: 100%  Weight: 198 lb 9.6 oz  (  90.1 kg)  Height: _0  (1.676 m)

## 2021-05-15 ENCOUNTER — Telehealth: Payer: Self-pay | Admitting: Emergency Medicine

## 2021-05-15 NOTE — Telephone Encounter (Signed)
[facility] - TREATMENT OF ESTABLISHED CHEMOTHERAPY-INDUCED NEUROPATHY WITH N-PALMITOYLETHANOLAMIDE, A CANNABIMIMETIC NUTRACEUTICAL: A RANDOMIZED DOUBLE-BLIND PHASE II PILOT TRIAL  Contacted patient to f/u on potential interest in trial.  Pt states her gabapentin is working really well and that her peripheral neuropathy is about 2/10 now.  Pt is no longer eligible for trial and is not interested in pursuing it further as her neuropathy has improved so much.  MD Lindi Adie made aware.  Wells Guiles 'Learta CoddingNeysa Bonito, RN, BSN Clinical Research Nurse I 05/15/21 11:39 AM

## 2021-05-17 NOTE — Progress Notes (Signed)
Radiation Oncology         [phone] ________________________________  Name: Tracy Dyer MRN: [id number]  Date: 05/19/2021  DOB: 1971-03-05  Re-Evaluation Note  CC: Tracy Pepper, MD  Tracy Lose, MD    ICD-10-CM   1. Malignant neoplasm of upper-outer quadrant of left breast in female, estrogen receptor positive ([place])  C50.412    Z17.0       Diagnosis:   Status post left lumpectomy: Left Breast UOQ, No residual IDC or DCIS identified, ER+ / PR+ / Her2-  (ypT0, ypN0)  Stage IIA (cT1c, cN1, cM0) Left Breast UOQ, Invasive Ductal Carcinoma, ER+ / PR+ / Her2-, Grade 3  Narrative:  The patient returns today to discuss radiation treatment options. 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   Since consultation, she underwent genetic testing on 08/28/20. Results were negative, showing no pathogenic variants identified. 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  She has been treated with adjuvant chemotherapy consisting of dose dense Adriamycin and Cytoxan on 09/06/20 through 11/01/20 followed by Taxol starting on 11/08/20 through 01/17/21 under Dr. Lindi Dyer.  She opted to proceed with left breast lumpectomy with nodal biopsies on 02/26/21 under the care of Dr. Donne Dyer. Pathology from the procedure revealed:  --Posterior left lumpectomy:  fibrocystic changes, including adenosis and usual ductal hyperplasia, with no evidence of residual carcinoma (complete response). --Lateral left lumpectomy: fibrocystic change with adenosis, usual ductal hyperplasia, calcifications, complex sclerosing lesion, fibroadenoma, and no evidence of carcinoma. --Left breast superior margin biopsy: Focal atypical ductal hyperplasia, fibrocystic changes with adenosis, and usual ductal hyperplasia. Skeletal muscle negative for  carcinoma.  --Left axillary sentinel lymph node excisions x4: all negative for carcinoma. --Prognostic indicators significant for: ER and PR both 95% positive and both with strong staining intensity; Her2 status negative; Ki67 20%.      The patient underwent left breast re-excision and mammoplasty on 04/18/21 which showed resection site and fibrocystic changes. No malignancy was identified from either procedures.  Pertinent imaging since the patient was last seen includes  --Bilateral breast MRI on 03/12/21 which demonstrated extensive postsurgical changes in the left breast and left axilla. No interval findings suspicious for malignancy were seen otherwise. --Left breast ultrasound on 03/21/21 which demonstrated a complex collection measuring at least 5.9 cm at the sentinel node scar site in the lower left axilla; noted as likely as seroma and of possible concern for superinfection.  On review of systems, the patient reports some soreness along both breast areas. She denies swelling in her left arm or hand.  And any other symptoms.    Allergies:  is allergic to sulfa antibiotics and sulfamethoxazole.  Meds: Current Outpatient Medications  Medication Sig Dispense Refill   calcium carbonate (TUMS - DOSED IN MG ELEMENTAL CALCIUM) 500 MG chewable tablet Chew 4 tablets by mouth as needed for indigestion or heartburn.     clonazePAM (KLONOPIN) 1 MG tablet TAKE 1 TABLET BY MOUTH TWICE A DAY AS NEEDED FOR ANXIETY 60 tablet 1   fluticasone (FLONASE) 50 MCG/ACT nasal spray Place 1 spray into both nostrils daily as needed for allergies.     gabapentin (NEURONTIN) 300 MG capsule Take 2 capsules (600 mg total) by mouth at bedtime. 180 capsule 3   l-methylfolate-B6-B12 (METANX) 3-35-2 MG TABS tablet Take 1 tablet by mouth daily.     LORazepam (  ATIVAN) 0.5 MG tablet TAKE 1 TABLET BY MOUTH TWICE A DAY FOR NAUSEA OR ANXIETY. DO NOT TAKE AT THE SAME TIME AS CLONAZEPAM 40 tablet 0   MAGNESIUM CITRATE PO Take  405 mg by mouth daily. 135 mg     pseudoephedrine (SUDAFED) 30 MG tablet Take 30 mg by mouth one time in dialysis.     sertraline (ZOLOFT) 100 MG tablet Take 100 mg by mouth at bedtime.     albuterol (VENTOLIN HFA) 108 (90 Base) MCG/ACT inhaler Inhale 1-2 puffs into the lungs every 4 (four) hours as needed for wheezing or shortness of breath. (Patient not taking: Reported on 05/19/2021) 8 g 2   Multiple Vitamins-Minerals (MULTIVITAMIN WITH MINERALS) tablet Take 1 tablet by mouth daily. (Patient not taking: Reported on 05/19/2021)     omeprazole (PRILOSEC) 20 MG capsule Take 1 capsule (20 mg total) by mouth daily. (Patient not taking: Reported on 05/19/2021) 30 capsule 3   oxyCODONE (ROXICODONE) 5 MG immediate release tablet Take 1 tablet (5 mg total) by mouth every 4 (four) hours as needed. (Patient not taking: Reported on 05/19/2021) 20 tablet 0   No current facility-administered medications for this encounter.    Physical Findings: The patient is in no acute distress. Patient is alert and oriented.  height is 5' 6"  (1.676 m) and weight is 198 lb 9.6 oz (90.1 kg). Her temporal temperature is 97.5 F (36.4 C) (abnormal). Her blood pressure is 108/68 and her pulse is 54 (abnormal). Her respiration is 16 and oxygen saturation is 100%.   Lungs are clear to auscultation bilaterally. Heart has regular rate and rhythm. No palpable cervical, supraclavicular, or axillary adenopathy. Abdomen soft, non-tender, normal bowel sounds. Right breast: no palpable mass, nipple discharge or bleeding.  Scars noted in the inferior aspect of the breast from breast reduction Left breast: Scars noted in the inferior aspect of the breast from oncoplastic surgery.  She has a faint scar in the lateral aspect of the breast which is one of the lumpectomy sites.  Scar also noted in the axillary region sentinel node procedure..  Lab Findings: Lab Results  Component Value Date   WBC 3.2 (L) 01/17/2021   HGB 12.4 01/17/2021    HCT 35.8 (L) 01/17/2021   MCV 92.0 01/17/2021   PLT 214 01/17/2021    Radiographic Findings: No results found.  Impression:  Status post left lumpectomy: Left Breast UOQ, No residual IDC or DCIS identified, ER+ / PR+ / Her2-  Stage IIA (cT1c, cN1, cM0) Left Breast UOQ, Invasive Ductal Carcinoma, ER+ / PR+ / Her2-, Grade 3  The patient was noted to have a complete response to her neoadjuvant chemotherapy.  She continues to be quite anxious about her overall situation.  I discussed that we would recommend radiation therapy to the left breast and axillary region in this situation given her biopsy-proven axillary nodal metastasis prior to therapy.  I discussed the general course of treatment side effects and potential toxicities of radiation therapy in this situation in detail with patient and her husband.  She appears to understand and wishes to proceed with planned course of treatment.  Patient reports she is considering bilateral mastectomies next year given her anxiety level.  She does understand that she would be at increased risk for wound healing issues given her previous surgeries as well as radiation treatment.  Plan:  Patient is scheduled for CT simulation later today.  Anticipate 6 and half weeks of radiation therapy as part of her overall  management.  We will use cardiac sparing techniques if necessary.  -----------------------------------  Blair Promise, PhD, MD  This document serves as a record of services personally performed by [name], MD. It was created on his behalf by Roney Mans, a trained medical scribe. The creation of this record is based on the scribe's personal observations and the provider's statements to them. This document has been checked and approved by the attending provider.

## 2021-05-19 ENCOUNTER — Other Ambulatory Visit: Payer: Self-pay

## 2021-05-19 ENCOUNTER — Encounter: Payer: Self-pay | Admitting: Radiation Oncology

## 2021-05-19 ENCOUNTER — Ambulatory Visit
Admission: RE | Admit: 2021-05-19 | Discharge: 2021-05-19 | Disposition: A | Discharge status: Home / Self Care | Payer: BC Managed Care – PPO | Source: Ambulatory Visit | Attending: Radiation Oncology | Admitting: Radiation Oncology

## 2021-05-19 VITALS — BP 108/68 | HR 54 | Temp 97.5°F | Resp 16 | Ht 66.0 in | Wt 198.6 lb

## 2021-05-19 DIAGNOSIS — Z79899 Other long term (current) drug therapy: Secondary | ICD-10-CM | POA: Insufficient documentation

## 2021-05-19 DIAGNOSIS — C50412 Malignant neoplasm of upper-outer quadrant of left female breast: Secondary | ICD-10-CM

## 2021-05-19 DIAGNOSIS — Z170 Estrogen receptor positive status [ER+]: Secondary | ICD-10-CM

## 2021-05-19 DIAGNOSIS — Z510 Encounter for antineoplastic radiation therapy: Secondary | ICD-10-CM | POA: Diagnosis not present

## 2021-05-19 DIAGNOSIS — Z923 Personal history of irradiation: Secondary | ICD-10-CM | POA: Diagnosis not present

## 2021-05-19 DIAGNOSIS — N6022 Fibroadenosis of left breast: Secondary | ICD-10-CM | POA: Diagnosis not present

## 2021-05-19 NOTE — Progress Notes (Signed)
See MD note for nursing evaluation. °

## 2021-05-26 ENCOUNTER — Encounter: Payer: Self-pay | Admitting: *Deleted

## 2021-05-26 ENCOUNTER — Telehealth: Payer: Self-pay | Admitting: Hematology and Oncology

## 2021-05-26 DIAGNOSIS — C50412 Malignant neoplasm of upper-outer quadrant of left female breast: Secondary | ICD-10-CM | POA: Diagnosis not present

## 2021-05-26 DIAGNOSIS — Z510 Encounter for antineoplastic radiation therapy: Secondary | ICD-10-CM | POA: Diagnosis not present

## 2021-05-26 DIAGNOSIS — Z170 Estrogen receptor positive status [ER+]: Secondary | ICD-10-CM | POA: Diagnosis not present

## 2021-05-26 NOTE — Telephone Encounter (Signed)
Scheduled per 10/24 in basket, message left with pt

## 2021-05-27 ENCOUNTER — Ambulatory Visit
Admission: RE | Admit: 2021-05-27 | Discharge: 2021-05-27 | Disposition: A | Discharge status: Home / Self Care | Payer: BC Managed Care – PPO | Source: Ambulatory Visit | Attending: Radiation Oncology | Admitting: Radiation Oncology

## 2021-05-27 DIAGNOSIS — Z170 Estrogen receptor positive status [ER+]: Secondary | ICD-10-CM

## 2021-05-27 DIAGNOSIS — C50412 Malignant neoplasm of upper-outer quadrant of left female breast: Secondary | ICD-10-CM | POA: Diagnosis not present

## 2021-05-27 DIAGNOSIS — Z510 Encounter for antineoplastic radiation therapy: Secondary | ICD-10-CM | POA: Diagnosis not present

## 2021-05-27 MED ORDER — ALRA NON-METALLIC DEODORANT (RAD-ONC)
1.0000 "application " | Freq: Once | TOPICAL | Status: AC
  Administered 2021-05-27: 1 via TOPICAL

## 2021-05-27 MED ORDER — RADIAPLEXRX EX GEL: Freq: Once | CUTANEOUS | Status: AC

## 2021-05-28 ENCOUNTER — Other Ambulatory Visit: Payer: Self-pay

## 2021-05-28 ENCOUNTER — Ambulatory Visit
Admission: RE | Admit: 2021-05-28 | Discharge: 2021-05-28 | Disposition: A | Discharge status: Home / Self Care | Payer: BC Managed Care – PPO | Source: Ambulatory Visit | Attending: Radiation Oncology | Admitting: Radiation Oncology

## 2021-05-28 DIAGNOSIS — Z170 Estrogen receptor positive status [ER+]: Secondary | ICD-10-CM | POA: Diagnosis not present

## 2021-05-28 DIAGNOSIS — Z510 Encounter for antineoplastic radiation therapy: Secondary | ICD-10-CM | POA: Diagnosis not present

## 2021-05-28 DIAGNOSIS — C50412 Malignant neoplasm of upper-outer quadrant of left female breast: Secondary | ICD-10-CM | POA: Diagnosis not present

## 2021-05-29 ENCOUNTER — Ambulatory Visit
Admission: RE | Admit: 2021-05-29 | Discharge: 2021-05-29 | Disposition: A | Discharge status: Home / Self Care | Payer: BC Managed Care – PPO | Source: Ambulatory Visit | Attending: Radiation Oncology | Admitting: Radiation Oncology

## 2021-05-29 ENCOUNTER — Encounter: Payer: Self-pay | Admitting: *Deleted

## 2021-05-29 DIAGNOSIS — Z510 Encounter for antineoplastic radiation therapy: Secondary | ICD-10-CM | POA: Diagnosis not present

## 2021-05-29 DIAGNOSIS — C50412 Malignant neoplasm of upper-outer quadrant of left female breast: Secondary | ICD-10-CM | POA: Diagnosis not present

## 2021-05-29 DIAGNOSIS — Z170 Estrogen receptor positive status [ER+]: Secondary | ICD-10-CM | POA: Diagnosis not present

## 2021-05-30 ENCOUNTER — Other Ambulatory Visit: Payer: Self-pay

## 2021-05-30 ENCOUNTER — Ambulatory Visit
Admission: RE | Admit: 2021-05-30 | Discharge: 2021-05-30 | Disposition: A | Discharge status: Home / Self Care | Payer: BC Managed Care – PPO | Source: Ambulatory Visit | Attending: Radiation Oncology | Admitting: Radiation Oncology

## 2021-05-30 DIAGNOSIS — Z510 Encounter for antineoplastic radiation therapy: Secondary | ICD-10-CM | POA: Diagnosis not present

## 2021-05-30 DIAGNOSIS — C50412 Malignant neoplasm of upper-outer quadrant of left female breast: Secondary | ICD-10-CM | POA: Diagnosis not present

## 2021-05-30 DIAGNOSIS — Z170 Estrogen receptor positive status [ER+]: Secondary | ICD-10-CM | POA: Diagnosis not present

## 2021-06-02 ENCOUNTER — Other Ambulatory Visit: Payer: Self-pay

## 2021-06-02 ENCOUNTER — Ambulatory Visit
Admission: RE | Admit: 2021-06-02 | Discharge: 2021-06-02 | Disposition: A | Discharge status: Home / Self Care | Payer: BC Managed Care – PPO | Source: Ambulatory Visit | Attending: Radiation Oncology | Admitting: Radiation Oncology

## 2021-06-02 DIAGNOSIS — H8113 Benign paroxysmal vertigo, bilateral: Secondary | ICD-10-CM | POA: Diagnosis not present

## 2021-06-02 DIAGNOSIS — R519 Headache, unspecified: Secondary | ICD-10-CM | POA: Diagnosis not present

## 2021-06-02 DIAGNOSIS — M47892 Other spondylosis, cervical region: Secondary | ICD-10-CM | POA: Diagnosis not present

## 2021-06-02 DIAGNOSIS — R278 Other lack of coordination: Secondary | ICD-10-CM | POA: Diagnosis not present

## 2021-06-02 DIAGNOSIS — C50412 Malignant neoplasm of upper-outer quadrant of left female breast: Secondary | ICD-10-CM | POA: Diagnosis not present

## 2021-06-02 DIAGNOSIS — Z170 Estrogen receptor positive status [ER+]: Secondary | ICD-10-CM | POA: Diagnosis not present

## 2021-06-02 DIAGNOSIS — Z510 Encounter for antineoplastic radiation therapy: Secondary | ICD-10-CM | POA: Diagnosis not present

## 2021-06-03 ENCOUNTER — Ambulatory Visit
Admission: RE | Admit: 2021-06-03 | Discharge: 2021-06-03 | Disposition: A | Discharge status: Home / Self Care | Payer: BC Managed Care – PPO | Source: Ambulatory Visit | Attending: Radiation Oncology | Admitting: Radiation Oncology

## 2021-06-03 DIAGNOSIS — Z79899 Other long term (current) drug therapy: Secondary | ICD-10-CM | POA: Diagnosis not present

## 2021-06-03 DIAGNOSIS — C50412 Malignant neoplasm of upper-outer quadrant of left female breast: Secondary | ICD-10-CM | POA: Insufficient documentation

## 2021-06-03 DIAGNOSIS — M5481 Occipital neuralgia: Secondary | ICD-10-CM | POA: Diagnosis not present

## 2021-06-03 DIAGNOSIS — R42 Dizziness and giddiness: Secondary | ICD-10-CM | POA: Diagnosis not present

## 2021-06-03 DIAGNOSIS — Z170 Estrogen receptor positive status [ER+]: Secondary | ICD-10-CM | POA: Insufficient documentation

## 2021-06-03 DIAGNOSIS — Z801 Family history of malignant neoplasm of trachea, bronchus and lung: Secondary | ICD-10-CM | POA: Insufficient documentation

## 2021-06-03 DIAGNOSIS — Z923 Personal history of irradiation: Secondary | ICD-10-CM | POA: Diagnosis not present

## 2021-06-04 ENCOUNTER — Ambulatory Visit
Admission: RE | Admit: 2021-06-04 | Discharge: 2021-06-04 | Disposition: A | Discharge status: Home / Self Care | Payer: BC Managed Care – PPO | Source: Ambulatory Visit | Attending: Radiation Oncology | Admitting: Radiation Oncology

## 2021-06-04 ENCOUNTER — Other Ambulatory Visit: Payer: Self-pay

## 2021-06-04 DIAGNOSIS — Z170 Estrogen receptor positive status [ER+]: Secondary | ICD-10-CM | POA: Diagnosis not present

## 2021-06-04 DIAGNOSIS — Z79899 Other long term (current) drug therapy: Secondary | ICD-10-CM | POA: Diagnosis not present

## 2021-06-04 DIAGNOSIS — M5481 Occipital neuralgia: Secondary | ICD-10-CM | POA: Diagnosis not present

## 2021-06-04 DIAGNOSIS — Z923 Personal history of irradiation: Secondary | ICD-10-CM | POA: Diagnosis not present

## 2021-06-04 DIAGNOSIS — C50412 Malignant neoplasm of upper-outer quadrant of left female breast: Secondary | ICD-10-CM | POA: Diagnosis not present

## 2021-06-04 DIAGNOSIS — R42 Dizziness and giddiness: Secondary | ICD-10-CM | POA: Diagnosis not present

## 2021-06-04 DIAGNOSIS — Z801 Family history of malignant neoplasm of trachea, bronchus and lung: Secondary | ICD-10-CM | POA: Diagnosis not present

## 2021-06-05 ENCOUNTER — Ambulatory Visit
Admission: RE | Admit: 2021-06-05 | Discharge: 2021-06-05 | Disposition: A | Discharge status: Home / Self Care | Payer: BC Managed Care – PPO | Source: Ambulatory Visit | Attending: Radiation Oncology | Admitting: Radiation Oncology

## 2021-06-05 DIAGNOSIS — Z79899 Other long term (current) drug therapy: Secondary | ICD-10-CM | POA: Diagnosis not present

## 2021-06-05 DIAGNOSIS — Z923 Personal history of irradiation: Secondary | ICD-10-CM | POA: Diagnosis not present

## 2021-06-05 DIAGNOSIS — C50412 Malignant neoplasm of upper-outer quadrant of left female breast: Secondary | ICD-10-CM | POA: Diagnosis not present

## 2021-06-05 DIAGNOSIS — M5481 Occipital neuralgia: Secondary | ICD-10-CM | POA: Diagnosis not present

## 2021-06-05 DIAGNOSIS — R42 Dizziness and giddiness: Secondary | ICD-10-CM | POA: Diagnosis not present

## 2021-06-05 DIAGNOSIS — Z170 Estrogen receptor positive status [ER+]: Secondary | ICD-10-CM | POA: Diagnosis not present

## 2021-06-05 DIAGNOSIS — Z801 Family history of malignant neoplasm of trachea, bronchus and lung: Secondary | ICD-10-CM | POA: Diagnosis not present

## 2021-06-06 ENCOUNTER — Ambulatory Visit
Admission: RE | Admit: 2021-06-06 | Discharge: 2021-06-06 | Disposition: A | Discharge status: Home / Self Care | Payer: BC Managed Care – PPO | Source: Ambulatory Visit | Attending: Radiation Oncology | Admitting: Radiation Oncology

## 2021-06-06 ENCOUNTER — Other Ambulatory Visit: Payer: Self-pay

## 2021-06-06 DIAGNOSIS — C50412 Malignant neoplasm of upper-outer quadrant of left female breast: Secondary | ICD-10-CM | POA: Diagnosis not present

## 2021-06-06 DIAGNOSIS — Z79899 Other long term (current) drug therapy: Secondary | ICD-10-CM | POA: Diagnosis not present

## 2021-06-06 DIAGNOSIS — Z170 Estrogen receptor positive status [ER+]: Secondary | ICD-10-CM | POA: Diagnosis not present

## 2021-06-06 DIAGNOSIS — M5481 Occipital neuralgia: Secondary | ICD-10-CM | POA: Diagnosis not present

## 2021-06-06 DIAGNOSIS — Z923 Personal history of irradiation: Secondary | ICD-10-CM | POA: Diagnosis not present

## 2021-06-06 DIAGNOSIS — Z801 Family history of malignant neoplasm of trachea, bronchus and lung: Secondary | ICD-10-CM | POA: Diagnosis not present

## 2021-06-06 DIAGNOSIS — R42 Dizziness and giddiness: Secondary | ICD-10-CM | POA: Diagnosis not present

## 2021-06-09 ENCOUNTER — Ambulatory Visit
Admission: RE | Admit: 2021-06-09 | Discharge: 2021-06-09 | Disposition: A | Discharge status: Home / Self Care | Payer: BC Managed Care – PPO | Source: Ambulatory Visit | Attending: Radiation Oncology | Admitting: Radiation Oncology

## 2021-06-09 DIAGNOSIS — M5481 Occipital neuralgia: Secondary | ICD-10-CM | POA: Diagnosis not present

## 2021-06-09 DIAGNOSIS — Z79899 Other long term (current) drug therapy: Secondary | ICD-10-CM | POA: Diagnosis not present

## 2021-06-09 DIAGNOSIS — Z170 Estrogen receptor positive status [ER+]: Secondary | ICD-10-CM | POA: Diagnosis not present

## 2021-06-09 DIAGNOSIS — Z801 Family history of malignant neoplasm of trachea, bronchus and lung: Secondary | ICD-10-CM | POA: Diagnosis not present

## 2021-06-09 DIAGNOSIS — Z923 Personal history of irradiation: Secondary | ICD-10-CM | POA: Diagnosis not present

## 2021-06-09 DIAGNOSIS — C50412 Malignant neoplasm of upper-outer quadrant of left female breast: Secondary | ICD-10-CM | POA: Diagnosis not present

## 2021-06-09 DIAGNOSIS — R42 Dizziness and giddiness: Secondary | ICD-10-CM | POA: Diagnosis not present

## 2021-06-10 ENCOUNTER — Ambulatory Visit
Admission: RE | Admit: 2021-06-10 | Discharge: 2021-06-10 | Disposition: A | Discharge status: Home / Self Care | Payer: BC Managed Care – PPO | Source: Ambulatory Visit | Attending: Radiation Oncology | Admitting: Radiation Oncology

## 2021-06-10 ENCOUNTER — Other Ambulatory Visit: Payer: Self-pay

## 2021-06-10 DIAGNOSIS — Z170 Estrogen receptor positive status [ER+]: Secondary | ICD-10-CM | POA: Diagnosis not present

## 2021-06-10 DIAGNOSIS — M5481 Occipital neuralgia: Secondary | ICD-10-CM | POA: Diagnosis not present

## 2021-06-10 DIAGNOSIS — Z79899 Other long term (current) drug therapy: Secondary | ICD-10-CM | POA: Diagnosis not present

## 2021-06-10 DIAGNOSIS — R42 Dizziness and giddiness: Secondary | ICD-10-CM | POA: Diagnosis not present

## 2021-06-10 DIAGNOSIS — Z801 Family history of malignant neoplasm of trachea, bronchus and lung: Secondary | ICD-10-CM | POA: Diagnosis not present

## 2021-06-10 DIAGNOSIS — Z923 Personal history of irradiation: Secondary | ICD-10-CM | POA: Diagnosis not present

## 2021-06-10 DIAGNOSIS — C50412 Malignant neoplasm of upper-outer quadrant of left female breast: Secondary | ICD-10-CM | POA: Diagnosis not present

## 2021-06-11 ENCOUNTER — Ambulatory Visit
Admission: RE | Admit: 2021-06-11 | Discharge: 2021-06-11 | Disposition: A | Discharge status: Home / Self Care | Payer: BC Managed Care – PPO | Source: Ambulatory Visit | Attending: Radiation Oncology | Admitting: Radiation Oncology

## 2021-06-11 DIAGNOSIS — Z923 Personal history of irradiation: Secondary | ICD-10-CM | POA: Diagnosis not present

## 2021-06-11 DIAGNOSIS — Z801 Family history of malignant neoplasm of trachea, bronchus and lung: Secondary | ICD-10-CM | POA: Diagnosis not present

## 2021-06-11 DIAGNOSIS — C50412 Malignant neoplasm of upper-outer quadrant of left female breast: Secondary | ICD-10-CM | POA: Diagnosis not present

## 2021-06-11 DIAGNOSIS — Z170 Estrogen receptor positive status [ER+]: Secondary | ICD-10-CM | POA: Diagnosis not present

## 2021-06-11 DIAGNOSIS — R42 Dizziness and giddiness: Secondary | ICD-10-CM | POA: Diagnosis not present

## 2021-06-11 DIAGNOSIS — M5481 Occipital neuralgia: Secondary | ICD-10-CM | POA: Diagnosis not present

## 2021-06-11 DIAGNOSIS — Z79899 Other long term (current) drug therapy: Secondary | ICD-10-CM | POA: Diagnosis not present

## 2021-06-12 ENCOUNTER — Ambulatory Visit
Admission: RE | Admit: 2021-06-12 | Discharge: 2021-06-12 | Disposition: A | Discharge status: Home / Self Care | Payer: BC Managed Care – PPO | Source: Ambulatory Visit | Attending: Radiation Oncology | Admitting: Radiation Oncology

## 2021-06-12 ENCOUNTER — Other Ambulatory Visit: Payer: Self-pay

## 2021-06-12 DIAGNOSIS — Z923 Personal history of irradiation: Secondary | ICD-10-CM | POA: Diagnosis not present

## 2021-06-12 DIAGNOSIS — M5481 Occipital neuralgia: Secondary | ICD-10-CM | POA: Diagnosis not present

## 2021-06-12 DIAGNOSIS — R42 Dizziness and giddiness: Secondary | ICD-10-CM | POA: Diagnosis not present

## 2021-06-12 DIAGNOSIS — Z801 Family history of malignant neoplasm of trachea, bronchus and lung: Secondary | ICD-10-CM | POA: Diagnosis not present

## 2021-06-12 DIAGNOSIS — C50412 Malignant neoplasm of upper-outer quadrant of left female breast: Secondary | ICD-10-CM | POA: Diagnosis not present

## 2021-06-12 DIAGNOSIS — Z79899 Other long term (current) drug therapy: Secondary | ICD-10-CM | POA: Diagnosis not present

## 2021-06-12 DIAGNOSIS — Z170 Estrogen receptor positive status [ER+]: Secondary | ICD-10-CM | POA: Diagnosis not present

## 2021-06-13 ENCOUNTER — Ambulatory Visit
Admission: RE | Admit: 2021-06-13 | Discharge: 2021-06-13 | Disposition: A | Discharge status: Home / Self Care | Payer: BC Managed Care – PPO | Source: Ambulatory Visit | Attending: Radiation Oncology | Admitting: Radiation Oncology

## 2021-06-13 DIAGNOSIS — Z801 Family history of malignant neoplasm of trachea, bronchus and lung: Secondary | ICD-10-CM | POA: Diagnosis not present

## 2021-06-13 DIAGNOSIS — R42 Dizziness and giddiness: Secondary | ICD-10-CM | POA: Diagnosis not present

## 2021-06-13 DIAGNOSIS — Z79899 Other long term (current) drug therapy: Secondary | ICD-10-CM | POA: Diagnosis not present

## 2021-06-13 DIAGNOSIS — Z923 Personal history of irradiation: Secondary | ICD-10-CM | POA: Diagnosis not present

## 2021-06-13 DIAGNOSIS — M5481 Occipital neuralgia: Secondary | ICD-10-CM | POA: Diagnosis not present

## 2021-06-13 DIAGNOSIS — C50412 Malignant neoplasm of upper-outer quadrant of left female breast: Secondary | ICD-10-CM | POA: Diagnosis not present

## 2021-06-13 DIAGNOSIS — Z170 Estrogen receptor positive status [ER+]: Secondary | ICD-10-CM | POA: Diagnosis not present

## 2021-06-16 ENCOUNTER — Ambulatory Visit: Payer: BC Managed Care – PPO

## 2021-06-17 ENCOUNTER — Telehealth: Payer: Self-pay | Admitting: *Deleted

## 2021-06-17 ENCOUNTER — Other Ambulatory Visit: Payer: Self-pay

## 2021-06-17 ENCOUNTER — Telehealth: Payer: Self-pay | Admitting: Internal Medicine

## 2021-06-17 ENCOUNTER — Ambulatory Visit
Admission: RE | Admit: 2021-06-17 | Discharge: 2021-06-17 | Disposition: A | Discharge status: Home / Self Care | Payer: BC Managed Care – PPO | Source: Ambulatory Visit | Attending: Radiation Oncology | Admitting: Radiation Oncology

## 2021-06-17 DIAGNOSIS — H8113 Benign paroxysmal vertigo, bilateral: Secondary | ICD-10-CM | POA: Diagnosis not present

## 2021-06-17 DIAGNOSIS — Z923 Personal history of irradiation: Secondary | ICD-10-CM | POA: Diagnosis not present

## 2021-06-17 DIAGNOSIS — R519 Headache, unspecified: Secondary | ICD-10-CM | POA: Diagnosis not present

## 2021-06-17 DIAGNOSIS — Z79899 Other long term (current) drug therapy: Secondary | ICD-10-CM | POA: Diagnosis not present

## 2021-06-17 DIAGNOSIS — C50412 Malignant neoplasm of upper-outer quadrant of left female breast: Secondary | ICD-10-CM | POA: Diagnosis not present

## 2021-06-17 DIAGNOSIS — M5481 Occipital neuralgia: Secondary | ICD-10-CM | POA: Diagnosis not present

## 2021-06-17 DIAGNOSIS — R278 Other lack of coordination: Secondary | ICD-10-CM | POA: Diagnosis not present

## 2021-06-17 DIAGNOSIS — R42 Dizziness and giddiness: Secondary | ICD-10-CM | POA: Diagnosis not present

## 2021-06-17 DIAGNOSIS — M47892 Other spondylosis, cervical region: Secondary | ICD-10-CM | POA: Diagnosis not present

## 2021-06-17 DIAGNOSIS — Z170 Estrogen receptor positive status [ER+]: Secondary | ICD-10-CM

## 2021-06-17 DIAGNOSIS — Z801 Family history of malignant neoplasm of trachea, bronchus and lung: Secondary | ICD-10-CM | POA: Diagnosis not present

## 2021-06-17 NOTE — Telephone Encounter (Signed)
CALLED PATIENT TO INFORM OF [place] VISIT WITH DR. VASLOW ON 07-01-21 @ 10:30 AM, SPOKE WITH PATIENT AND SHE IS AWARE OF THIS APPT.

## 2021-06-17 NOTE — Telephone Encounter (Signed)
Tracy Dyer from Dr. Johny Shears office called me and asked me to schedule a new patient consultation with Dr. Mickeal Skinner. I scheduled the appt and made her aware of the day and time.

## 2021-06-18 ENCOUNTER — Ambulatory Visit
Admission: RE | Admit: 2021-06-18 | Discharge: 2021-06-18 | Disposition: A | Discharge status: Home / Self Care | Payer: BC Managed Care – PPO | Source: Ambulatory Visit | Attending: Radiation Oncology | Admitting: Radiation Oncology

## 2021-06-18 DIAGNOSIS — M5481 Occipital neuralgia: Secondary | ICD-10-CM | POA: Diagnosis not present

## 2021-06-18 DIAGNOSIS — Z79899 Other long term (current) drug therapy: Secondary | ICD-10-CM | POA: Diagnosis not present

## 2021-06-18 DIAGNOSIS — Z170 Estrogen receptor positive status [ER+]: Secondary | ICD-10-CM | POA: Diagnosis not present

## 2021-06-18 DIAGNOSIS — Z801 Family history of malignant neoplasm of trachea, bronchus and lung: Secondary | ICD-10-CM | POA: Diagnosis not present

## 2021-06-18 DIAGNOSIS — Z923 Personal history of irradiation: Secondary | ICD-10-CM | POA: Diagnosis not present

## 2021-06-18 DIAGNOSIS — C50412 Malignant neoplasm of upper-outer quadrant of left female breast: Secondary | ICD-10-CM | POA: Diagnosis not present

## 2021-06-18 DIAGNOSIS — R42 Dizziness and giddiness: Secondary | ICD-10-CM | POA: Diagnosis not present

## 2021-06-19 ENCOUNTER — Other Ambulatory Visit: Payer: Self-pay

## 2021-06-19 ENCOUNTER — Ambulatory Visit
Admission: RE | Admit: 2021-06-19 | Discharge: 2021-06-19 | Disposition: A | Discharge status: Home / Self Care | Payer: BC Managed Care – PPO | Source: Ambulatory Visit | Attending: Radiation Oncology | Admitting: Radiation Oncology

## 2021-06-19 DIAGNOSIS — M5481 Occipital neuralgia: Secondary | ICD-10-CM | POA: Diagnosis not present

## 2021-06-19 DIAGNOSIS — Z79899 Other long term (current) drug therapy: Secondary | ICD-10-CM | POA: Diagnosis not present

## 2021-06-19 DIAGNOSIS — Z170 Estrogen receptor positive status [ER+]: Secondary | ICD-10-CM | POA: Diagnosis not present

## 2021-06-19 DIAGNOSIS — R42 Dizziness and giddiness: Secondary | ICD-10-CM | POA: Diagnosis not present

## 2021-06-19 DIAGNOSIS — Z923 Personal history of irradiation: Secondary | ICD-10-CM | POA: Diagnosis not present

## 2021-06-19 DIAGNOSIS — C50412 Malignant neoplasm of upper-outer quadrant of left female breast: Secondary | ICD-10-CM | POA: Diagnosis not present

## 2021-06-19 DIAGNOSIS — Z801 Family history of malignant neoplasm of trachea, bronchus and lung: Secondary | ICD-10-CM | POA: Diagnosis not present

## 2021-06-20 ENCOUNTER — Ambulatory Visit
Admission: RE | Admit: 2021-06-20 | Discharge: 2021-06-20 | Disposition: A | Discharge status: Home / Self Care | Payer: BC Managed Care – PPO | Source: Ambulatory Visit | Attending: Radiation Oncology | Admitting: Radiation Oncology

## 2021-06-20 ENCOUNTER — Encounter: Payer: Self-pay | Admitting: Hematology and Oncology

## 2021-06-20 DIAGNOSIS — Z170 Estrogen receptor positive status [ER+]: Secondary | ICD-10-CM | POA: Diagnosis not present

## 2021-06-20 DIAGNOSIS — C50412 Malignant neoplasm of upper-outer quadrant of left female breast: Secondary | ICD-10-CM | POA: Diagnosis not present

## 2021-06-20 DIAGNOSIS — Z801 Family history of malignant neoplasm of trachea, bronchus and lung: Secondary | ICD-10-CM | POA: Diagnosis not present

## 2021-06-20 DIAGNOSIS — R42 Dizziness and giddiness: Secondary | ICD-10-CM | POA: Diagnosis not present

## 2021-06-20 DIAGNOSIS — Z79899 Other long term (current) drug therapy: Secondary | ICD-10-CM | POA: Diagnosis not present

## 2021-06-20 DIAGNOSIS — M5481 Occipital neuralgia: Secondary | ICD-10-CM | POA: Diagnosis not present

## 2021-06-20 DIAGNOSIS — Z923 Personal history of irradiation: Secondary | ICD-10-CM | POA: Diagnosis not present

## 2021-06-22 ENCOUNTER — Ambulatory Visit
Admission: RE | Admit: 2021-06-22 | Discharge: 2021-06-22 | Disposition: A | Discharge status: Home / Self Care | Payer: BC Managed Care – PPO | Source: Ambulatory Visit | Attending: Radiation Oncology | Admitting: Radiation Oncology

## 2021-06-22 DIAGNOSIS — M5481 Occipital neuralgia: Secondary | ICD-10-CM | POA: Diagnosis not present

## 2021-06-22 DIAGNOSIS — Z170 Estrogen receptor positive status [ER+]: Secondary | ICD-10-CM | POA: Diagnosis not present

## 2021-06-22 DIAGNOSIS — C50412 Malignant neoplasm of upper-outer quadrant of left female breast: Secondary | ICD-10-CM | POA: Diagnosis not present

## 2021-06-22 DIAGNOSIS — Z923 Personal history of irradiation: Secondary | ICD-10-CM | POA: Diagnosis not present

## 2021-06-22 DIAGNOSIS — Z801 Family history of malignant neoplasm of trachea, bronchus and lung: Secondary | ICD-10-CM | POA: Diagnosis not present

## 2021-06-22 DIAGNOSIS — Z79899 Other long term (current) drug therapy: Secondary | ICD-10-CM | POA: Diagnosis not present

## 2021-06-22 DIAGNOSIS — R42 Dizziness and giddiness: Secondary | ICD-10-CM | POA: Diagnosis not present

## 2021-06-23 ENCOUNTER — Ambulatory Visit: Payer: BC Managed Care – PPO

## 2021-06-24 ENCOUNTER — Ambulatory Visit: Payer: BC Managed Care – PPO

## 2021-06-25 ENCOUNTER — Ambulatory Visit: Payer: BC Managed Care – PPO

## 2021-06-30 ENCOUNTER — Other Ambulatory Visit: Payer: Self-pay | Admitting: Hematology and Oncology

## 2021-06-30 ENCOUNTER — Ambulatory Visit
Admission: RE | Admit: 2021-06-30 | Discharge: 2021-06-30 | Disposition: A | Discharge status: Home / Self Care | Payer: BC Managed Care – PPO | Source: Ambulatory Visit | Attending: Radiation Oncology | Admitting: Radiation Oncology

## 2021-06-30 ENCOUNTER — Other Ambulatory Visit: Payer: Self-pay

## 2021-06-30 ENCOUNTER — Ambulatory Visit: Payer: BC Managed Care – PPO

## 2021-06-30 DIAGNOSIS — R42 Dizziness and giddiness: Secondary | ICD-10-CM | POA: Diagnosis not present

## 2021-06-30 DIAGNOSIS — R110 Nausea: Secondary | ICD-10-CM

## 2021-06-30 DIAGNOSIS — C50412 Malignant neoplasm of upper-outer quadrant of left female breast: Secondary | ICD-10-CM

## 2021-06-30 DIAGNOSIS — M5481 Occipital neuralgia: Secondary | ICD-10-CM | POA: Diagnosis not present

## 2021-06-30 DIAGNOSIS — Z170 Estrogen receptor positive status [ER+]: Secondary | ICD-10-CM | POA: Diagnosis not present

## 2021-06-30 DIAGNOSIS — Z801 Family history of malignant neoplasm of trachea, bronchus and lung: Secondary | ICD-10-CM | POA: Diagnosis not present

## 2021-06-30 DIAGNOSIS — Z923 Personal history of irradiation: Secondary | ICD-10-CM | POA: Diagnosis not present

## 2021-06-30 DIAGNOSIS — Z79899 Other long term (current) drug therapy: Secondary | ICD-10-CM | POA: Diagnosis not present

## 2021-06-30 MED ORDER — SONAFINE EX EMUL
1.0000 "application " | Freq: Once | CUTANEOUS | Status: AC
  Administered 2021-06-30: 1 via TOPICAL

## 2021-07-01 ENCOUNTER — Inpatient Hospital Stay: Payer: BC Managed Care – PPO | Attending: Adult Health | Admitting: Internal Medicine

## 2021-07-01 ENCOUNTER — Ambulatory Visit
Admission: RE | Admit: 2021-07-01 | Discharge: 2021-07-01 | Disposition: A | Discharge status: Home / Self Care | Payer: BC Managed Care – PPO | Source: Ambulatory Visit | Attending: Radiation Oncology | Admitting: Radiation Oncology

## 2021-07-01 ENCOUNTER — Ambulatory Visit: Payer: BC Managed Care – PPO

## 2021-07-01 VITALS — BP 119/66 | HR 98 | Temp 98.1°F | Wt 198.5 lb

## 2021-07-01 DIAGNOSIS — C50412 Malignant neoplasm of upper-outer quadrant of left female breast: Secondary | ICD-10-CM | POA: Diagnosis not present

## 2021-07-01 DIAGNOSIS — Z801 Family history of malignant neoplasm of trachea, bronchus and lung: Secondary | ICD-10-CM | POA: Diagnosis not present

## 2021-07-01 DIAGNOSIS — H81399 Other peripheral vertigo, unspecified ear: Secondary | ICD-10-CM

## 2021-07-01 DIAGNOSIS — Z79899 Other long term (current) drug therapy: Secondary | ICD-10-CM | POA: Insufficient documentation

## 2021-07-01 DIAGNOSIS — Z170 Estrogen receptor positive status [ER+]: Secondary | ICD-10-CM | POA: Diagnosis not present

## 2021-07-01 DIAGNOSIS — M5481 Occipital neuralgia: Secondary | ICD-10-CM

## 2021-07-01 DIAGNOSIS — Z923 Personal history of irradiation: Secondary | ICD-10-CM | POA: Insufficient documentation

## 2021-07-01 DIAGNOSIS — R42 Dizziness and giddiness: Secondary | ICD-10-CM | POA: Diagnosis not present

## 2021-07-01 NOTE — Progress Notes (Signed)
[facility] [place], [place] [address] [phone]   New Patient Evaluation  Date of Service: 07/01/21 Patient Name: Tracy Dyer Patient MRN: [id number] Patient DOB: 1970/10/24 Provider: Ventura Sellers, MD  Identifying Statement:  Tracy Dyer is a 50 y.o. female with Occipital neuralgia of right side - Plan: Ambulatory referral to Neurosurgery  Peripheral vertigo, unspecified laterality - Plan: Ambulatory referral to ENT who presents for initial consultation and evaluation regarding cancer associated neurologic deficits.    Referring Provider: [name], MD [address] [place],  [place] [address]  Primary Cancer:  Oncologic History: Oncology History  Malignant neoplasm of upper-outer quadrant of left breast in female, estrogen receptor positive ([place])  08/21/2020 Initial Diagnosis   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 No pathogenic variants identified on the [facility] Breast Cancer STAT Panel + Multi-Cancer Panel. VUS in [facility] called c.983C>T identified. The report date is 09/04/2020.   The STAT Breast cancer panel offered by [facility] includes sequencing and rearrangement analysis for the following 9 genes:  ATM, BRCA1, BRCA2, CDH1, CHEK2, PALB2, PTEN, STK11 and TP53.    The Multi-Cancer Panel offered  by [facility] includes sequencing and/or deletion duplication testing of the following 85 genes: AIP, ALK, APC, ATM, AXIN2,BAP1,  BARD1, BLM, BMPR1A, BRCA1, BRCA2, BRIP1, CASR, CDC73, CDH1, CDK4, CDKN1B, CDKN1C, CDKN2A (p14ARF), CDKN2A (p16INK4a), CEBPA, CHEK2, CTNNA1, DICER1, DIS3L2, EGFR (c.2369C>T, p.Thr790Met variant only), EPCAM (Deletion/duplication testing only), FH, FLCN, GATA2, GPC3, GREM1 (Promoter region deletion/duplication testing only), HOXB13 (c.251G>A, p.Gly84Glu), HRAS, KIT, MAX, MEN1, MET, MITF (c.952G>A, p.Glu318Lys variant only), MLH1, MSH2, MSH3, MSH6, MUTYH, NBN, NF1, NF2, NTHL1, PALB2, PDGFRA, PHOX2B, PMS2, POLD1, POLE, POT1, PRKAR1A, PTCH1, PTEN, RAD50, RAD51C, RAD51D, RB1, RECQL4, RET, RNF43, RUNX1, SDHAF2, SDHA (sequence changes only), SDHB, SDHC, SDHD, SMAD4, SMARCA4, SMARCB1, SMARCE1, STK11, SUFU, TERC, TERT, TMEM127, TP53, TSC1, TSC2, VHL, WRN and WT1.    09/06/2020 - 01/17/2021 Chemotherapy          02/26/2021 Surgery   Left lumpectomy: no residual carcinoma with margins and axillary lymph nodes also negative for carcinoma       History of Present Illness: The patient's records from the referring physician were obtained and reviewed and the patient interviewed to confirm this HPI.  Tracy Dyer presents today to review neurologic complaints.  She describes several years (3-5 at least) history of both vertigo and neck and back of head pain.  Vertigo is described as "room spinning or moving, intense, for seconds to a minute, always provoked by positional change (bending forward or backwards).  There are no other neurologic symptoms associated with the vertigo.  It might be somewhat worse since all the cancer treatments.  The neck pain is described as "electricity type feeling" running up back of neck to back of head on the right side.  This is also associated  with position, in particular turning of her head.  Recently completed breast irradiation with Dr.  Sondra Come.  Medications: Current Outpatient Medications on File Prior to Visit  Medication Sig Dispense Refill   calcium carbonate (TUMS - DOSED IN MG ELEMENTAL CALCIUM) 500 MG chewable tablet Chew 4 tablets by mouth as needed for indigestion or heartburn.     clonazePAM (KLONOPIN) 1 MG tablet TAKE 1 TABLET BY MOUTH TWICE A DAY AS NEEDED FOR ANXIETY 60 tablet 1   fluticasone (FLONASE) 50 MCG/ACT nasal spray Place 1 spray into both nostrils daily as needed for allergies.     gabapentin (NEURONTIN) 300 MG capsule Take 2 capsules (600 mg total) by mouth at bedtime. 180 capsule 3   l-methylfolate-B6-B12 (METANX) 3-35-2 MG TABS tablet Take 1 tablet by mouth daily.     LORazepam (ATIVAN) 0.5 MG tablet TAKE 1 TABLET BY MOUTH TWICE A DAY FOR NAUSEA OR ANXIETY. DO NOT TAKE AT THE SAME TIME AS CLONAZEPAM 40 tablet 0   MAGNESIUM CITRATE PO Take 405 mg by mouth daily. 135 mg     pseudoephedrine (SUDAFED) 30 MG tablet Take 30 mg by mouth one time in dialysis.     sertraline (ZOLOFT) 100 MG tablet Take 100 mg by mouth at bedtime.     albuterol (VENTOLIN HFA) 108 (90 Base) MCG/ACT inhaler Inhale 1-2 puffs into the lungs every 4 (four) hours as needed for wheezing or shortness of breath. (Patient not taking: Reported on 05/19/2021) 8 g 2   Multiple Vitamins-Minerals (MULTIVITAMIN WITH MINERALS) tablet Take 1 tablet by mouth daily. (Patient not taking: Reported on 05/19/2021)     omeprazole (PRILOSEC) 20 MG capsule Take 1 capsule (20 mg total) by mouth daily. (Patient not taking: Reported on 05/19/2021) 30 capsule 3   oxyCODONE (ROXICODONE) 5 MG immediate release tablet Take 1 tablet (5 mg total) by mouth every 4 (four) hours as needed. (Patient not taking: Reported on 05/19/2021) 20 tablet 0   No current facility-administered medications on file prior to visit.    Allergies:  Allergies  Allergen Reactions   Sulfa Antibiotics     Other reaction(s): Rash, urticarial   Sulfamethoxazole Rash    Feel sun burn    Past Medical History:  Past Medical History:  Diagnosis Date   Anxiety    Cancer ([place])    breast   Depression    Family history of breast cancer    Family history of lung cancer    Family history of ovarian cancer    GERD (gastroesophageal reflux disease)    diet controlled   Seasonal allergies    Past Surgical History:  Past Surgical History:  Procedure Laterality Date   BREAST BIOPSY Left 2019   x 2 biopsy   BREAST EXCISIONAL BIOPSY Left    BREAST LUMPECTOMY WITH RADIOACTIVE SEED AND SENTINEL LYMPH NODE BIOPSY Left 02/26/2021   Procedure: LEFT BREAST LUMPECTOMY WITH RADIOACTIVE SEED x2; LEFT AXILLARY SENTINEL NODE BIOPSY;  Surgeon: [name], MD;  Location: [facility];  Service: General;  Laterality: Left;   BREAST LUMPECTOMY WITH RADIOFREQUENCY TAG IDENTIFICATION Left 2/97/9892   Procedure: LEFT BREAST MRI WIRE GUIDED EXCISION;  Surgeon: [name], MD;  Location: [facility];  Service: General;  Laterality: Left;   BREAST REDUCTION SURGERY Bilateral 04/18/2021   Procedure: MAMMARY REDUCTION  (BREAST) LEFT ONCOPLASTIC BREAST RECONSTRUCTION, RIGHT BREAST REDUCTION;  Surgeon: Irene Limbo, MD;  Location: [facility];  Service: Plastics;  Laterality: Bilateral;   CHOLECYSTECTOMY  2008   PORT-A-CATH REMOVAL Right 04/18/2021   Procedure: REMOVAL PORT-A-CATH;  Surgeon: [name], MD;  Location: [facility];  Service: General;  Laterality: Right;   PORTACATH PLACEMENT N/A 09/05/2020   Procedure: INSERTION PORT-A-CATH;  Surgeon: [name], MD;  Location: [facility];  Service: General;  Laterality: N/A;  PEC BLOCK   WISDOM TOOTH EXTRACTION     Social History:  Social History   Socioeconomic History   Marital status: Married    Spouse name: Not on file   Number of children: Not on file   Years of education: Not on file   Highest education level: Not on file  Occupational History   Not on  file  Tobacco Use   Smoking status: Never   Smokeless tobacco: Never  Vaping Use   Vaping Use: Never used  Substance and Sexual Activity   Alcohol use: Not Currently   Drug use: Never   Sexual activity: Yes    Birth control/protection: None  Other Topics Concern   Not on file  Social History Narrative   Not on file   Social Determinants of Health   Financial Resource Strain: Not on file  Food Insecurity: No Food Insecurity   Worried About Running Out of Food in the Last Year: Never true   [place] in the Last Year: Never true  Transportation Needs: No Transportation Needs   Lack of Transportation (Medical): No   Lack of Transportation (Non-Medical): No  Physical Activity: Not on file  Stress: Not on file  Social Connections: Not on file  Intimate Partner Violence: Not on file   Family History:  Family History  Problem Relation Age of Onset   Eczema Father    Allergic rhinitis Brother    Breast cancer Mother        in 75's   Cancer Maternal Uncle        unk type   Lung cancer Maternal Grandfather 42   Heart Problems Paternal Grandfather    Melanoma Cousin    Ovarian cancer Paternal Great-grandmother    Cancer Paternal Great-grandmother        GYN cancer, possibly ovarian?   Cancer Maternal Aunt        half aunts/uncles x5- rare cancers    Review of Systems: Constitutional: Doesn't report fevers, chills or abnormal weight loss Eyes: Doesn't report blurriness of vision Ears, nose, mouth, throat, and face: Doesn't report sore throat Respiratory: Doesn't report cough, dyspnea or wheezes Cardiovascular: Doesn't report palpitation, chest discomfort  Gastrointestinal:  Doesn't report nausea, constipation, diarrhea GU: Doesn't report incontinence Skin: Doesn't report skin rashes Neurological: Per HPI Musculoskeletal: Doesn't report joint pain Behavioral/Psych: Doesn't report anxiety  Physical Exam: Vitals:   07/01/21 1043  BP: 119/66  Pulse: 98  Temp:  98.1 F (36.7 C)  SpO2: 98%   KPS: 90. General: Alert, cooperative, pleasant, in no acute distress Head: Normal EENT: No conjunctival injection or scleral icterus.  Lungs: Resp effort normal Cardiac: Regular rate Abdomen: Non-distended abdomen Skin: No rashes cyanosis or petechiae. Extremities: No clubbing or edema  Neurologic Exam: Mental Status: Awake, alert, attentive to examiner. Oriented to self and environment. Language is fluent with intact comprehension.  Cranial Nerves: Visual acuity is grossly normal. Visual fields are full. Extra-ocular movements intact. No ptosis. Face is symmetric Motor: Tone and bulk are normal. Power is full in both arms and legs. Reflexes are symmetric, no pathologic reflexes present.  Sensory: Intact to light touch Gait: Normal.  Labs: I have reviewed the data as listed    Component Value Date/Time   NA 139 01/17/2021 1015   K 3.9 01/17/2021 1015   CL 109 01/17/2021 1015   CO2 24 01/17/2021 1015   GLUCOSE 97 01/17/2021 1015   BUN 9 01/17/2021 1015   CREATININE 0.82 01/17/2021 1015   CALCIUM 8.8 (L) 01/17/2021 1015   PROT 6.2 (L) 01/17/2021 1015   ALBUMIN 3.8 01/17/2021 1015   AST 25 01/17/2021 1015   ALT 31 01/17/2021 1015   ALKPHOS 60 01/17/2021 1015   BILITOT 0.4 01/17/2021 1015   GFRNONAA >60 01/17/2021 1015   Lab Results  Component Value Date   WBC 3.2 (L) 01/17/2021   NEUTROABS 1.8 01/17/2021   HGB 12.4 01/17/2021   HCT 35.8 (L) 01/17/2021   MCV 92.0 01/17/2021   PLT 214 01/17/2021     Assessment/Plan Occipital neuralgia of right side - Plan: Ambulatory referral to Neurosurgery  Peripheral vertigo, unspecified laterality - Plan: Ambulatory referral to ENT  Tracy Dyer presents today with 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  Etiology is unclear, certainly reason to suspect cannalicular otolith.   Neck and headache syndrome is consistent with right greater  occipital neuralgia.  This is apparent with clinical complaint, reproducibility, chronic nature, occupational history.    For vertigo, we recommended referral to ENT for VNG testing and positioning maneuvers, if indicated.   For occipital neuralgia, for which gabapentin has not been effective, we recommended referral to Dr. Davy Pique at [facility] for nerve block procedure.        We spent twenty additional minutes teaching regarding the natural history, biology, and historical experience in the treatment of neurologic complications of cancer.   We appreciate the opportunity to participate in the care of Tracy Dyer.  We are happy to follow up with her following the above referral, testing and procedures.  She can contact us through [employer].  All questions were answered. The patient knows to call the clinic with any problems, questions or concerns. No barriers to learning were detected.  The total time spent in the encounter was 40 minutes and more than 50% was on counseling and review of test results   Ventura Sellers, MD Medical Director of Neuro-Oncology [facility] 07/01/21 2:49 PM

## 2021-07-02 ENCOUNTER — Ambulatory Visit: Payer: BC Managed Care – PPO

## 2021-07-02 ENCOUNTER — Other Ambulatory Visit: Payer: Self-pay

## 2021-07-02 ENCOUNTER — Ambulatory Visit
Admission: RE | Admit: 2021-07-02 | Discharge: 2021-07-02 | Disposition: A | Discharge status: Home / Self Care | Payer: BC Managed Care – PPO | Source: Ambulatory Visit | Attending: Radiation Oncology | Admitting: Radiation Oncology

## 2021-07-02 DIAGNOSIS — R42 Dizziness and giddiness: Secondary | ICD-10-CM | POA: Diagnosis not present

## 2021-07-02 DIAGNOSIS — M5481 Occipital neuralgia: Secondary | ICD-10-CM | POA: Diagnosis not present

## 2021-07-02 DIAGNOSIS — Z801 Family history of malignant neoplasm of trachea, bronchus and lung: Secondary | ICD-10-CM | POA: Diagnosis not present

## 2021-07-02 DIAGNOSIS — C50412 Malignant neoplasm of upper-outer quadrant of left female breast: Secondary | ICD-10-CM | POA: Diagnosis not present

## 2021-07-02 DIAGNOSIS — Z170 Estrogen receptor positive status [ER+]: Secondary | ICD-10-CM | POA: Diagnosis not present

## 2021-07-02 DIAGNOSIS — Z79899 Other long term (current) drug therapy: Secondary | ICD-10-CM | POA: Diagnosis not present

## 2021-07-02 DIAGNOSIS — Z923 Personal history of irradiation: Secondary | ICD-10-CM | POA: Diagnosis not present

## 2021-07-02 MED ORDER — LORAZEPAM 0.5 MG PO TABS: ORAL_TABLET | ORAL | 0 refills | Status: DC

## 2021-07-03 ENCOUNTER — Ambulatory Visit (INDEPENDENT_AMBULATORY_CARE_PROVIDER_SITE_OTHER): Payer: BC Managed Care – PPO

## 2021-07-03 ENCOUNTER — Ambulatory Visit
Admission: RE | Admit: 2021-07-03 | Discharge: 2021-07-03 | Disposition: A | Discharge status: Home / Self Care | Payer: BC Managed Care – PPO | Source: Ambulatory Visit | Attending: Radiation Oncology | Admitting: Radiation Oncology

## 2021-07-03 ENCOUNTER — Ambulatory Visit: Payer: BC Managed Care – PPO | Admitting: Podiatry

## 2021-07-03 ENCOUNTER — Ambulatory Visit: Payer: BC Managed Care – PPO

## 2021-07-03 VITALS — BP 108/70 | HR 66 | Temp 98.3°F

## 2021-07-03 DIAGNOSIS — M722 Plantar fascial fibromatosis: Secondary | ICD-10-CM | POA: Diagnosis not present

## 2021-07-03 DIAGNOSIS — Z510 Encounter for antineoplastic radiation therapy: Secondary | ICD-10-CM | POA: Diagnosis not present

## 2021-07-03 DIAGNOSIS — C50412 Malignant neoplasm of upper-outer quadrant of left female breast: Secondary | ICD-10-CM | POA: Insufficient documentation

## 2021-07-03 DIAGNOSIS — M84374A Stress fracture, right foot, initial encounter for fracture: Secondary | ICD-10-CM

## 2021-07-03 DIAGNOSIS — Z170 Estrogen receptor positive status [ER+]: Secondary | ICD-10-CM | POA: Diagnosis not present

## 2021-07-03 MED ORDER — MELOXICAM 7.5 MG PO TABS: 7.5000 mg | ORAL_TABLET | Freq: Every day | ORAL | 0 refills | Status: DC | PRN

## 2021-07-03 NOTE — Patient Instructions (Signed)

## 2021-07-03 NOTE — Progress Notes (Signed)
Faxed referral to Dr Davy Pique @ [facility] and separate referral to [facility].

## 2021-07-04 ENCOUNTER — Ambulatory Visit: Payer: BC Managed Care – PPO

## 2021-07-04 ENCOUNTER — Ambulatory Visit
Admission: RE | Admit: 2021-07-04 | Discharge: 2021-07-04 | Disposition: A | Discharge status: Home / Self Care | Payer: BC Managed Care – PPO | Source: Ambulatory Visit | Attending: Radiation Oncology | Admitting: Radiation Oncology

## 2021-07-04 ENCOUNTER — Other Ambulatory Visit: Payer: Self-pay

## 2021-07-04 DIAGNOSIS — Z510 Encounter for antineoplastic radiation therapy: Secondary | ICD-10-CM | POA: Diagnosis not present

## 2021-07-04 DIAGNOSIS — C50412 Malignant neoplasm of upper-outer quadrant of left female breast: Secondary | ICD-10-CM | POA: Diagnosis not present

## 2021-07-04 DIAGNOSIS — Z170 Estrogen receptor positive status [ER+]: Secondary | ICD-10-CM | POA: Diagnosis not present

## 2021-07-06 NOTE — Progress Notes (Signed)
Patient Care Team: [name], MD as PCP - General (Family Medicine)  DIAGNOSIS:    ICD-10-CM   1. Malignant neoplasm of upper-outer quadrant of left breast in female, estrogen receptor positive ([place])  C50.412    Z17.0       SUMMARY OF ONCOLOGIC HISTORY: Oncology History  Malignant neoplasm of upper-outer quadrant of left breast in female, estrogen receptor positive ([place])  08/21/2020 Initial Diagnosis   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 No pathogenic variants identified on the [facility] Breast Cancer STAT Panel + Multi-Cancer Panel. VUS in [facility] called c.983C>T identified. The report date is 09/04/2020.   The STAT Breast cancer panel offered by [facility] includes sequencing and rearrangement analysis for the following 9 genes:  ATM, BRCA1, BRCA2, CDH1, CHEK2, PALB2, PTEN, STK11 and TP53.    The Multi-Cancer Panel offered by [facility] includes sequencing and/or deletion duplication testing of the following 85 genes: AIP, ALK, APC, ATM, AXIN2,BAP1,  BARD1, BLM, BMPR1A, BRCA1, BRCA2, BRIP1, CASR, CDC73, CDH1, CDK4, CDKN1B, CDKN1C, CDKN2A (p14ARF), CDKN2A (p16INK4a), CEBPA, CHEK2, CTNNA1, DICER1, DIS3L2, EGFR (c.2369C>T, p.Thr790Met variant only), EPCAM (Deletion/duplication testing only), FH, FLCN, GATA2, GPC3, GREM1 (Promoter region deletion/duplication testing only), HOXB13 (c.251G>A, p.Gly84Glu), HRAS, KIT,  MAX, MEN1, MET, MITF (c.952G>A, p.Glu318Lys variant only), MLH1, MSH2, MSH3, MSH6, MUTYH, NBN, NF1, NF2, NTHL1, PALB2, PDGFRA, PHOX2B, PMS2, POLD1, POLE, POT1, PRKAR1A, PTCH1, PTEN, RAD50, RAD51C, RAD51D, RB1, RECQL4, RET, RNF43, RUNX1, SDHAF2, SDHA (sequence changes only), SDHB, SDHC, SDHD, SMAD4, SMARCA4, SMARCB1, SMARCE1, STK11, SUFU, TERC, TERT, TMEM127, TP53, TSC1, TSC2, VHL, WRN and WT1.    09/06/2020 - 01/17/2021 Chemotherapy          02/26/2021 Surgery   Left lumpectomy: no residual carcinoma with margins and axillary lymph nodes also negative for carcinoma       CHIEF COMPLIANT: Follow-up of breast cancer  INTERVAL HISTORY: Tracy Dyer is a 50 y.o. with above-mentioned history of breast cancer treated with neoadjuvant chemotherapy with Adriamycin Cytoxan followed by Taxol and subsequently had a lumpectomy. She presents to the clinic today for follow-up.   ALLERGIES:  is allergic to sulfa antibiotics and sulfamethoxazole.  MEDICATIONS:  Current Outpatient Medications  Medication Sig Dispense Refill   albuterol (VENTOLIN HFA) 108 (90 Base) MCG/ACT inhaler Inhale 1-2 puffs into the lungs every 4 (four) hours as needed for wheezing or shortness of breath. (Patient not taking: Reported on 05/19/2021) 8 g 2   calcium carbonate (TUMS - DOSED IN MG ELEMENTAL CALCIUM) 500 MG chewable tablet Chew 4 tablets by mouth as needed for indigestion or heartburn.     clonazePAM (KLONOPIN) 1 MG tablet TAKE 1 TABLET BY MOUTH TWICE A DAY AS NEEDED FOR ANXIETY 60 tablet 1   fluticasone (FLONASE) 50 MCG/ACT nasal spray Place 1 spray into both nostrils daily as needed for allergies.     gabapentin (NEURONTIN) 300 MG capsule Take 2 capsules (600 mg total) by mouth at  bedtime. 180 capsule 3   l-methylfolate-B6-B12 (METANX) 3-35-2 MG TABS tablet Take 1 tablet by mouth daily.     LORazepam (ATIVAN) 0.5 MG tablet Tid prn 40 tablet 0   LORazepam (ATIVAN) 0.5 MG tablet TAKE 1 TABLET BY MOUTH TWICE A  DAY FOR NAUSEA OR ANXIETY. DO NOT TAKE AT THE SAME TIME AS CLONAZEPAM 40 tablet 0   MAGNESIUM CITRATE PO Take 405 mg by mouth daily. 135 mg     meloxicam (MOBIC) 7.5 MG tablet Take 1 tablet (7.5 mg total) by mouth daily as needed for pain. 14 tablet 0   Multiple Vitamins-Minerals (MULTIVITAMIN WITH MINERALS) tablet Take 1 tablet by mouth daily. (Patient not taking: Reported on 05/19/2021)     omeprazole (PRILOSEC) 20 MG capsule Take 1 capsule (20 mg total) by mouth daily. (Patient not taking: Reported on 05/19/2021) 30 capsule 3   oxyCODONE (ROXICODONE) 5 MG immediate release tablet Take 1 tablet (5 mg total) by mouth every 4 (four) hours as needed. (Patient not taking: Reported on 05/19/2021) 20 tablet 0   pseudoephedrine (SUDAFED) 30 MG tablet Take 30 mg by mouth one time in dialysis.     sertraline (ZOLOFT) 100 MG tablet Take 100 mg by mouth at bedtime.     No current facility-administered medications for this visit.    PHYSICAL EXAMINATION: ECOG PERFORMANCE STATUS: 1 - Symptomatic but completely ambulatory  There were no vitals filed for this visit. There were no vitals filed for this visit.  BREAST: No palpable masses or nodules in either right or left breasts. No palpable axillary supraclavicular or infraclavicular adenopathy no breast tenderness or nipple discharge. (exam performed in the presence of a chaperone)  LABORATORY DATA:  I have reviewed the data as listed CMP Latest Ref Rng & Units 01/17/2021 01/10/2021 01/03/2021  Glucose 70 - 99 mg/dL 97 108(H) 102(H)  BUN 6 - 20 mg/dL _0 Creatinine 0.44 - 1.00 mg/dL 0.82 0.75 0.81  Sodium 135 - 145 mmol/L 139 139 140  Potassium 3.5 - 5.1 mmol/L 3.9 3.7 3.9  Chloride 98 - 111 mmol/L 109 108 107  CO2 22 - 32 mmol/L _1 Calcium 8.9 - 10.3 mg/dL 8.8(L) 9.1 9.4  Total Protein 6.5 - 8.1 g/dL 6.2(L) 6.2(L) 6.1(L)  Total Bilirubin 0.3 - 1.2 mg/dL 0.4 0.3 0.3  Alkaline Phos 38 - 126 U/L 60 60 59  AST 15 - 41 U/L _2 ALT 0 -  44 U/L _3 Lab Results  Component Value Date   WBC 3.2 (L) 01/17/2021   HGB 12.4 01/17/2021   HCT 35.8 (L) 01/17/2021   MCV 92.0 01/17/2021   PLT 214 01/17/2021   NEUTROABS 1.8 01/17/2021    ASSESSMENT & PLAN:  Malignant neoplasm of upper-outer quadrant of left breast in female, estrogen receptor positive ([place]) 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  Neoadjuvant chemotherapy with dose dense Adriamycin and Cytoxan followed  by Taxol weekly x12 completed 01/17/2021 2. 02/26/2021 breast conserving surgery with sentinel lymph node and targeted node dissection,: Path CR 0/4 LN Neg 3.  Adjuvant radiation therapy 05/28/21- 07/10/21 4.  Followed by adjuvant antiestrogen therapy probably with tamoxifen. ------------------------------------------------------------------------------------------------------------------------- Tamoxifen Counseling: We discussed the risks and benefits of tamoxifen. These include but not limited to insomnia, hot flashes, mood changes, vaginal dryness, and weight gain. Although rare, serious side effects including endometrial cancer, risk of blood clots were also discussed. We strongly believe that the benefits far outweigh the risks. Patient understands these risks and consented to starting treatment. Planned treatment duration is 10 years.  We will check FSH and estradiol levels today. I will  connect with her in 1 month to discuss results and decide if she will go on tamoxifen versus letrozole.  RTC in 3 months for SCP visit  Chemo induced peripheral neuropathy: Currently on gabapentin 300 mg at bedtime and she is tapering the medication. She is very anxious about additional surgeries in the healing and recovery from those.   Hot flashes: Patient wants to take black cohosh.    No orders of the defined types were placed in this encounter.  The patient has a good understanding of the overall plan. she agrees with it. she will call with any problems that may develop before the next visit here.  Total time spent: 30 mins including face to face time and time spent for planning, charting and coordination of care  Rulon Eisenmenger, MD, MPH 07/07/2021  I, Thana Ates, am acting as scribe for Dr. [name].  I have reviewed the above documentation for accuracy and completeness, and I agree with the above.

## 2021-07-06 NOTE — Assessment & Plan Note (Signed)
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%, PR 95%, HER2 equivocal, FISH pending, Ki-67 20% T1CN1 stage IIa MammaPrint: High risk: Luminal type B, probability of path CR 6% with chemo and antiestrogen therapy predicted benefit of treatment at 5 years 94.6%, average 10-year risk of recurrence untreated: 29%  Treatment plan: 1.Neoadjuvant chemotherapy with dose dense Adriamycin and Cytoxan followed by Taxol weekly x12completed 01/17/2021 2.7/27/2022breast conserving surgery with sentinel lymph node and targeted node dissection,: Path CR 0/4 LN Neg 3.Adjuvant radiation therapy 05/28/21- 07/10/21 4.Followed by adjuvant antiestrogen therapy probably with tamoxifen. ------------------------------------------------------------------------------------------------------------------------- Tamoxifen Counseling: We discussed the risks and benefits of tamoxifen. These include but not limited to insomnia, hot flashes, mood changes, vaginal dryness, and weight gain. Although rare, serious side effects including endometrial cancer, risk of blood clots were also discussed. We strongly believe that the benefits far outweigh the risks. Patient understands these risks and consented to starting treatment. Planned treatment duration is 10 years.  RTC in 3 months for SCP visit     Chemo induced peripheral neuropathy: Currently on gabapentin 600 mg at bedtime She is very anxious about additional surgeries in the healing and recovery from those.

## 2021-07-06 NOTE — Progress Notes (Signed)
Subjective:   Patient ID: Tracy Dyer, female   DOB: 50 y.o.   MRN: [id number]   HPI 50 year old female presents the office today for concerns of pain on her right side much worse than left side.  She has pain to the plantar heel but she points on the fifth metatarsal base where she gets a lot of discomfort and was feels that something is broken.  No recent injury that she reports.  The start about 3 to 5 months ago has been getting worse.  The heel pain started before the lateral foot pain.  She describes a shooting, throbbing, aching discomfort to her foot.  No recent treatment.  No other concerns.   Review of Systems  All other systems reviewed and are negative.  Past Medical History:  Diagnosis Date   Anxiety    Cancer ([place])    breast   Depression    Family history of breast cancer    Family history of lung cancer    Family history of ovarian cancer    GERD (gastroesophageal reflux disease)    diet controlled   Seasonal allergies     Past Surgical History:  Procedure Laterality Date   BREAST BIOPSY Left 2019   x 2 biopsy   BREAST EXCISIONAL BIOPSY Left    BREAST LUMPECTOMY WITH RADIOACTIVE SEED AND SENTINEL LYMPH NODE BIOPSY Left 02/26/2021   Procedure: LEFT BREAST LUMPECTOMY WITH RADIOACTIVE SEED x2; LEFT AXILLARY SENTINEL NODE BIOPSY;  Surgeon: [name], MD;  Location: [facility];  Service: General;  Laterality: Left;   BREAST LUMPECTOMY WITH RADIOFREQUENCY TAG IDENTIFICATION Left 3/88/8280   Procedure: LEFT BREAST MRI WIRE GUIDED EXCISION;  Surgeon: [name], MD;  Location: [facility];  Service: General;  Laterality: Left;   BREAST REDUCTION SURGERY Bilateral 04/18/2021   Procedure: MAMMARY REDUCTION  (BREAST) LEFT ONCOPLASTIC BREAST RECONSTRUCTION, RIGHT BREAST REDUCTION;  Surgeon: Irene Limbo, MD;  Location: [facility];  Service: Plastics;  Laterality: Bilateral;   CHOLECYSTECTOMY  2008    PORT-A-CATH REMOVAL Right 04/18/2021   Procedure: REMOVAL PORT-A-CATH;  Surgeon: [name], MD;  Location: [facility];  Service: General;  Laterality: Right;   PORTACATH PLACEMENT N/A 09/05/2020   Procedure: INSERTION PORT-A-CATH;  Surgeon: [name], MD;  Location: [facility];  Service: General;  Laterality: N/A;  PEC BLOCK   WISDOM TOOTH EXTRACTION       Current Outpatient Medications:    meloxicam (MOBIC) 7.5 MG tablet, Take 1 tablet (7.5 mg total) by mouth daily as needed for pain., Disp: 14 tablet, Rfl: 0   albuterol (VENTOLIN HFA) 108 (90 Base) MCG/ACT inhaler, Inhale 1-2 puffs into the lungs every 4 (four) hours as needed for wheezing or shortness of breath. (Patient not taking: Reported on 05/19/2021), Disp: 8 g, Rfl: 2   calcium carbonate (TUMS - DOSED IN MG ELEMENTAL CALCIUM) 500 MG chewable tablet, Chew 4 tablets by mouth as needed for indigestion or heartburn., Disp: , Rfl:    clonazePAM (KLONOPIN) 1 MG tablet, TAKE 1 TABLET BY MOUTH TWICE A DAY AS NEEDED FOR ANXIETY, Disp: 60 tablet, Rfl: 1   fluticasone (FLONASE) 50 MCG/ACT nasal spray, Place 1 spray into both nostrils daily as needed for allergies., Disp: , Rfl:    gabapentin (NEURONTIN) 300 MG capsule, Take 2 capsules (600 mg total) by mouth at bedtime., Disp: 180 capsule, Rfl: 3   l-methylfolate-B6-B12 (METANX) 3-35-2 MG TABS tablet, Take 1 tablet by mouth daily., Disp: ,  Rfl:    LORazepam (ATIVAN) 0.5 MG tablet, Tid prn, Disp: 40 tablet, Rfl: 0   LORazepam (ATIVAN) 0.5 MG tablet, TAKE 1 TABLET BY MOUTH TWICE A DAY FOR NAUSEA OR ANXIETY. DO NOT TAKE AT THE SAME TIME AS CLONAZEPAM, Disp: 40 tablet, Rfl: 0   MAGNESIUM CITRATE PO, Take 405 mg by mouth daily. 135 mg, Disp: , Rfl:    Multiple Vitamins-Minerals (MULTIVITAMIN WITH MINERALS) tablet, Take 1 tablet by mouth daily. (Patient not taking: Reported on 05/19/2021), Disp: , Rfl:    omeprazole (PRILOSEC) 20 MG capsule, Take 1 capsule (20 mg total) by mouth  daily. (Patient not taking: Reported on 05/19/2021), Disp: 30 capsule, Rfl: 3   oxyCODONE (ROXICODONE) 5 MG immediate release tablet, Take 1 tablet (5 mg total) by mouth every 4 (four) hours as needed. (Patient not taking: Reported on 05/19/2021), Disp: 20 tablet, Rfl: 0   pseudoephedrine (SUDAFED) 30 MG tablet, Take 30 mg by mouth one time in dialysis., Disp: , Rfl:    sertraline (ZOLOFT) 100 MG tablet, Take 100 mg by mouth at bedtime., Disp: , Rfl:   Allergies  Allergen Reactions   Sulfa Antibiotics     Other reaction(s): Rash, urticarial   Sulfamethoxazole Rash    Feel sun burn          Objective:  Physical Exam  General: AAO x3, NAD  Dermatological: Skin is warm, dry and supple bilateral.  There are no open sores, no preulcerative lesions, no rash or signs of infection present.  Vascular: Dorsalis Pedis artery and Posterior Tibial artery pedal pulses are 2/4 bilateral with immedate capillary fill time. There is no pain with calf compression, swelling, warmth, erythema.   Neruologic: Grossly intact via light touch bilateral.  Sensation intact with Semmes Weinstein monofilament. Negative Tinel sign.  Musculoskeletal: There is tenderness palpation on plantar medial tubercle of the calcaneus at the insertion of plantar fascial on the right foot.  There is discomfort on the fifth metatarsal base.  Majority discomfort is localized.  No significant pain on the peroneal tendon.  No other area pinpoint tenderness.  There is trace edema there is no erythema or warmth.  Flexor, extensor tendons appear to be intact.  MMT 5/5.  Gait: Unassisted, Nonantalgic.       Assessment:   50 year old female with Plantar fasciitis with compensation resulting in fifth metatarsal base pain     Plan:  -Treatment options discussed including all alternatives, risks, and complications -Etiology of symptoms were discussed -X-rays obtained reviewed.  No definitive evidence of acute fracture  noted. -Given her symptoms recommend elicitation of the cam boot which was dispensed today.  Continue recommend ice daily.  As she starts to feel better discussed traction exercises.  Meloxicam as needed.  Trula Slade DPM

## 2021-07-07 ENCOUNTER — Ambulatory Visit: Payer: BC Managed Care – PPO

## 2021-07-07 ENCOUNTER — Ambulatory Visit
Admission: RE | Admit: 2021-07-07 | Discharge: 2021-07-07 | Disposition: A | Discharge status: Home / Self Care | Payer: BC Managed Care – PPO | Source: Ambulatory Visit | Attending: Radiation Oncology | Admitting: Radiation Oncology

## 2021-07-07 ENCOUNTER — Other Ambulatory Visit: Payer: Self-pay

## 2021-07-07 ENCOUNTER — Inpatient Hospital Stay: Payer: BC Managed Care – PPO | Admitting: Hematology and Oncology

## 2021-07-07 ENCOUNTER — Inpatient Hospital Stay: Payer: BC Managed Care – PPO

## 2021-07-07 DIAGNOSIS — Z170 Estrogen receptor positive status [ER+]: Secondary | ICD-10-CM

## 2021-07-07 DIAGNOSIS — G620 Drug-induced polyneuropathy: Secondary | ICD-10-CM | POA: Insufficient documentation

## 2021-07-07 DIAGNOSIS — C50412 Malignant neoplasm of upper-outer quadrant of left female breast: Secondary | ICD-10-CM

## 2021-07-07 DIAGNOSIS — R232 Flushing: Secondary | ICD-10-CM | POA: Insufficient documentation

## 2021-07-07 DIAGNOSIS — Z510 Encounter for antineoplastic radiation therapy: Secondary | ICD-10-CM | POA: Diagnosis not present

## 2021-07-08 ENCOUNTER — Ambulatory Visit: Payer: BC Managed Care – PPO

## 2021-07-08 ENCOUNTER — Encounter: Payer: Self-pay | Admitting: Hematology and Oncology

## 2021-07-08 ENCOUNTER — Ambulatory Visit: Payer: BC Managed Care – PPO | Admitting: Radiation Oncology

## 2021-07-08 ENCOUNTER — Ambulatory Visit
Admission: RE | Admit: 2021-07-08 | Discharge: 2021-07-08 | Disposition: A | Discharge status: Home / Self Care | Payer: BC Managed Care – PPO | Source: Ambulatory Visit | Attending: Radiation Oncology | Admitting: Radiation Oncology

## 2021-07-08 DIAGNOSIS — C50412 Malignant neoplasm of upper-outer quadrant of left female breast: Secondary | ICD-10-CM | POA: Diagnosis not present

## 2021-07-08 DIAGNOSIS — Z510 Encounter for antineoplastic radiation therapy: Secondary | ICD-10-CM | POA: Diagnosis not present

## 2021-07-08 DIAGNOSIS — Z170 Estrogen receptor positive status [ER+]: Secondary | ICD-10-CM | POA: Diagnosis not present

## 2021-07-08 LAB — FOLLICLE STIMULATING HORMONE: FSH: 73.4 m[IU]/mL

## 2021-07-09 ENCOUNTER — Other Ambulatory Visit: Payer: Self-pay

## 2021-07-09 ENCOUNTER — Ambulatory Visit
Admission: RE | Admit: 2021-07-09 | Discharge: 2021-07-09 | Disposition: A | Discharge status: Home / Self Care | Payer: BC Managed Care – PPO | Source: Ambulatory Visit | Attending: Radiation Oncology | Admitting: Radiation Oncology

## 2021-07-09 ENCOUNTER — Ambulatory Visit: Payer: BC Managed Care – PPO

## 2021-07-09 DIAGNOSIS — Z170 Estrogen receptor positive status [ER+]: Secondary | ICD-10-CM | POA: Diagnosis not present

## 2021-07-09 DIAGNOSIS — C50412 Malignant neoplasm of upper-outer quadrant of left female breast: Secondary | ICD-10-CM | POA: Diagnosis not present

## 2021-07-09 DIAGNOSIS — Z510 Encounter for antineoplastic radiation therapy: Secondary | ICD-10-CM | POA: Diagnosis not present

## 2021-07-10 ENCOUNTER — Ambulatory Visit
Admission: RE | Admit: 2021-07-10 | Discharge: 2021-07-10 | Disposition: A | Discharge status: Home / Self Care | Payer: BC Managed Care – PPO | Source: Ambulatory Visit | Attending: Radiation Oncology | Admitting: Radiation Oncology

## 2021-07-10 ENCOUNTER — Ambulatory Visit: Payer: BC Managed Care – PPO

## 2021-07-10 ENCOUNTER — Encounter: Payer: Self-pay | Admitting: Radiation Oncology

## 2021-07-10 DIAGNOSIS — Z170 Estrogen receptor positive status [ER+]: Secondary | ICD-10-CM | POA: Diagnosis not present

## 2021-07-10 DIAGNOSIS — C50412 Malignant neoplasm of upper-outer quadrant of left female breast: Secondary | ICD-10-CM | POA: Diagnosis not present

## 2021-07-10 DIAGNOSIS — Z510 Encounter for antineoplastic radiation therapy: Secondary | ICD-10-CM | POA: Diagnosis not present

## 2021-07-11 ENCOUNTER — Ambulatory Visit: Payer: BC Managed Care – PPO

## 2021-07-11 LAB — ESTRADIOL, ULTRA SENS: Estradiol, Sensitive: 4 pg/mL

## 2021-07-14 ENCOUNTER — Ambulatory Visit: Payer: BC Managed Care – PPO

## 2021-07-14 ENCOUNTER — Other Ambulatory Visit: Payer: Self-pay | Admitting: Adult Health

## 2021-07-14 ENCOUNTER — Encounter: Payer: Self-pay | Admitting: *Deleted

## 2021-07-14 DIAGNOSIS — C50412 Malignant neoplasm of upper-outer quadrant of left female breast: Secondary | ICD-10-CM

## 2021-07-14 DIAGNOSIS — Z170 Estrogen receptor positive status [ER+]: Secondary | ICD-10-CM

## 2021-07-22 DIAGNOSIS — H8113 Benign paroxysmal vertigo, bilateral: Secondary | ICD-10-CM | POA: Diagnosis not present

## 2021-07-22 DIAGNOSIS — R278 Other lack of coordination: Secondary | ICD-10-CM | POA: Diagnosis not present

## 2021-07-22 DIAGNOSIS — M47892 Other spondylosis, cervical region: Secondary | ICD-10-CM | POA: Diagnosis not present

## 2021-07-22 DIAGNOSIS — R519 Headache, unspecified: Secondary | ICD-10-CM | POA: Diagnosis not present

## 2021-07-23 ENCOUNTER — Encounter: Payer: Self-pay | Admitting: Hematology and Oncology

## 2021-07-24 ENCOUNTER — Other Ambulatory Visit: Payer: Self-pay

## 2021-07-24 ENCOUNTER — Ambulatory Visit: Payer: BC Managed Care – PPO | Admitting: Podiatry

## 2021-07-24 ENCOUNTER — Other Ambulatory Visit (HOSPITAL_COMMUNITY): Payer: Self-pay

## 2021-07-24 DIAGNOSIS — U071 COVID-19: Secondary | ICD-10-CM

## 2021-07-24 MED ORDER — NIRMATRELVIR/RITONAVIR (PAXLOVID)TABLET
3.0000 | ORAL_TABLET | Freq: Two times a day (BID) | ORAL | 0 refills | Status: AC
  Filled 2021-07-24: qty 30, 5d supply, fill #0

## 2021-07-24 NOTE — Progress Notes (Signed)
Rx sent to [facility]. 

## 2021-07-25 NOTE — Telephone Encounter (Signed)
Called this pt to inform her of paxlovid Rx.  Pt had questions about taking this with klonopin, I advised pt to space them apart by 2 hours.  I also spoke with pt about having pharmacy review her meds therefore we sent her Rx to [facility].  I instructed pt to call us if symptoms worsen or persist beyond 5 days.  Pt verbalized understanding and thanks

## 2021-08-04 ENCOUNTER — Other Ambulatory Visit: Payer: Self-pay | Admitting: Hematology and Oncology

## 2021-08-04 DIAGNOSIS — F419 Anxiety disorder, unspecified: Secondary | ICD-10-CM

## 2021-08-06 ENCOUNTER — Other Ambulatory Visit: Payer: Self-pay | Admitting: Hematology and Oncology

## 2021-08-06 DIAGNOSIS — F419 Anxiety disorder, unspecified: Secondary | ICD-10-CM

## 2021-08-06 MED ORDER — CLONAZEPAM 1 MG PO TABS: ORAL_TABLET | ORAL | 1 refills | Status: DC

## 2021-08-07 NOTE — Progress Notes (Signed)
HEMATOLOGY-ONCOLOGY TELEPHONE VISIT PROGRESS NOTE  I connected with Tracy Dyer on 08/08/2021 at  8:15 AM EST by telephone and verified that I am speaking with the correct person using two identifiers.  I discussed the limitations, risks, security and privacy concerns of performing an evaluation and management service by telephone and the availability of in person appointments.  I also discussed with the patient that there may be a patient responsible charge related to this service. The patient expressed understanding and agreed to proceed.   History of Present Illness: Tracy Dyer is a 51 y.o. female with above-mentioned history of breast cancer treated with neoadjuvant chemotherapy with Adriamycin Cytoxan followed by Taxol and subsequently had a lumpectomy. She presents via telephone today for follow-up.  Recent COVID infection in December.  She is recovering from that.  She took PaxLOVID Her foot may have a stress fracture and she has a cast on it. This is affecting her emotionally.  Oncology History  Malignant neoplasm of upper-outer quadrant of left breast in female, estrogen receptor positive ([place])  08/21/2020 Initial Diagnosis   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 No pathogenic variants identified on the [facility] Breast Cancer STAT Panel + Multi-Cancer Panel. [facility] in [facility] called  c.983C>T identified. The report date is 09/04/2020.   The STAT Breast cancer panel offered by [facility] includes sequencing and rearrangement analysis for the following 9 genes:  ATM, BRCA1, BRCA2, CDH1, CHEK2, PALB2, PTEN, STK11 and TP53.    The Multi-Cancer Panel offered by [facility] includes sequencing and/or deletion duplication testing of the following 85 genes: AIP, ALK, APC, ATM, AXIN2,BAP1,  BARD1, BLM, BMPR1A, BRCA1, BRCA2, BRIP1, CASR, CDC73, CDH1, CDK4, CDKN1B, CDKN1C, CDKN2A (p14ARF), CDKN2A (p16INK4a), CEBPA, CHEK2, CTNNA1, DICER1, DIS3L2, EGFR (c.2369C>T, p.Thr790Met variant only), EPCAM (Deletion/duplication testing only), FH, FLCN, GATA2, GPC3, GREM1 (Promoter region deletion/duplication testing only), HOXB13 (c.251G>A, p.Gly84Glu), HRAS, KIT, MAX, MEN1, MET, MITF (c.952G>A, p.Glu318Lys variant only), MLH1, MSH2, MSH3, MSH6, MUTYH, NBN, NF1, NF2, NTHL1, PALB2, PDGFRA, PHOX2B, PMS2, POLD1, POLE, POT1, PRKAR1A, PTCH1, PTEN, RAD50, RAD51C, RAD51D, RB1, RECQL4, RET, RNF43, RUNX1, SDHAF2, SDHA (sequence changes only), SDHB, SDHC, SDHD, SMAD4, SMARCA4, SMARCB1, SMARCE1, STK11, SUFU, TERC, TERT, TMEM127, TP53, TSC1, TSC2, VHL, WRN and WT1.    09/06/2020 - 01/17/2021 Chemotherapy   AC X 4 foll by Taxol       02/26/2021 Surgery   Left lumpectomy: no residual carcinoma with margins and axillary lymph nodes also negative for carcinoma       Observations/Objective:  Foot stress fracture vs plantar fascitis   Assessment Plan:  Malignant neoplasm of upper-outer quadrant of left breast in female, estrogen receptor positive ([place]) Screening mammogram detected punctate calcifications left breast UOQ: Stable, interval development of mass UOQ left breast with distortion 1.8 cm (3 cm from nipple), 3 o'clock position 5 cm from nipple 1 cm mass: Biopsy benign, concordant, fibroadenoma, 2 enlarged lymph nodes: Positive, breast biopsy  revealed grade 3 IDC ER 95%, PR 95%, HER2 equivocal, FISH pending, Ki-67  20% T1CN1 stage IIa MammaPrint: High risk: Luminal type B, probability of path CR 6% with chemo and antiestrogen therapy predicted benefit of treatment at 5 years 94.6%, average 10-year risk of recurrence untreated: 29%   Treatment plan: 1.  Neoadjuvant chemotherapy with dose dense Adriamycin and Cytoxan followed by Taxol weekly x12 completed 01/17/2021 2. 02/26/2021 breast conserving surgery with sentinel lymph node and targeted node dissection,: Path CR 0/4 LN Neg 04/18/21: Bilateral Mammoplasty: Benign 3.  Adjuvant radiation therapy 05/28/21- 07/10/21 4.  Followed by adjuvant antiestrogen therapy: Letrozole start 08/08/21 (Patient in menopause [place] 74, Estradiol 4 done on 07/07/21) ------------------------------------------------------------------------------------------------------------------------- Chemo induced peripheral neuropathy: off gabapentin. Hot flashes: managing.  Letrozole counseling: We discussed the risks and benefits of anti-estrogen therapy with aromatase inhibitors. These include but not limited to insomnia, hot flashes, mood changes, vaginal dryness, bone density loss, and weight gain. We strongly believe that the benefits far outweigh the risks. Patient understands these risks and consented to starting treatment. Planned treatment duration is 7 years.  Breast Cancer Surveillance: 1. Breast Exam: 08/08/21: Benign 2. Mammograms: US aspiration of Left breast cyst 5.9 cm  RTC for SCP visit  I discussed the assessment and treatment plan with the patient. The patient was provided an opportunity to ask questions and all were answered. The patient agreed with the plan and demonstrated an understanding of the instructions. The patient was advised to call back or seek an in-person evaluation if the symptoms worsen or if the condition fails to improve as anticipated.   Total time spent: 15 mins including non-face to face time and time spent for planning, charting and coordination of  care  Rulon Eisenmenger, MD 08/08/2021    I, Thana Ates, am acting as scribe for [name], MD.  I have reviewed the above documentation for accuracy and completeness, and I agree with the above.

## 2021-08-07 NOTE — Assessment & Plan Note (Signed)
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%, PR 95%, HER2 equivocal, FISH pending, Ki-67 20% T1CN1 stage IIa MammaPrint: High risk: Luminal type B, probability of path CR 6% with chemo and antiestrogen therapy predicted benefit of treatment at 5 years 94.6%, average 10-year risk of recurrence untreated: 29%  Treatment plan: 1.Neoadjuvant chemotherapy with dose dense Adriamycin and Cytoxan followed by Taxol weekly x12completed 01/17/2021 2.7/27/2022breast conserving surgery with sentinel lymph node and targeted node dissection,: Path CR 0/4 LN Neg 04/18/21: Bilateral Mammoplasty: Benign 3.Adjuvant radiation therapy 05/28/21- 07/10/21 4.Followed by adjuvant antiestrogen therapyprobably with tamoxifen. ------------------------------------------------------------------------------------------------------------------------- Tamoxifen Toxicities:  Chemo induced peripheral neuropathy: Currently on gabapentin 368m at bedtime and she is tapering the medication. She is very anxious about additional surgeries in the healing and recovery from those.  Hot flashes: Patient wants to take black cohosh.  Breast Cancer Surveillance: 1. Breast Exam: 08/08/21: Benign 2. Mammograms: [place] breast cyst 5.9cm  RTC in 1 year

## 2021-08-08 ENCOUNTER — Encounter: Payer: Self-pay | Admitting: Hematology and Oncology

## 2021-08-08 ENCOUNTER — Encounter: Payer: Self-pay | Admitting: Radiation Oncology

## 2021-08-08 ENCOUNTER — Inpatient Hospital Stay: Payer: BC Managed Care – PPO | Attending: Adult Health | Admitting: Hematology and Oncology

## 2021-08-08 DIAGNOSIS — Z170 Estrogen receptor positive status [ER+]: Secondary | ICD-10-CM

## 2021-08-08 DIAGNOSIS — C50412 Malignant neoplasm of upper-outer quadrant of left female breast: Secondary | ICD-10-CM | POA: Diagnosis not present

## 2021-08-08 MED ORDER — LETROZOLE 2.5 MG PO TABS: 2.5000 mg | ORAL_TABLET | Freq: Every day | ORAL | 3 refills | Status: DC

## 2021-08-13 NOTE — Progress Notes (Incomplete)
Radiation Oncology         [phone] ________________________________  Patient Name: Tracy Dyer MRN: [id number] DOB: [birth year] Referring Physician: [name] (Profile Not Attached) Date of Service: 07/10/2021 [facility] [place]                                                        End Of Treatment Note  Diagnoses: C50.412-Malignant neoplasm of upper-outer quadrant of left female breast  Cancer Staging: Status post left lumpectomy: Left Breast UOQ, No residual IDC or DCIS identified, ER+ / PR+ / Her2-  (ypT0, ypN0)   Stage IIA (cT1c, cN1, cM0) Left Breast UOQ, Invasive Ductal Carcinoma, ER+ / PR+ / Her2-, Grade 3  Intent: Curative  Radiation Treatment Dates: 05/27/2021 through 07/10/2021 Site Technique Total Dose (Gy) Dose per Fx (Gy) Completed Fx Beam Energies  Breast, Left: Breast_Lt 3D 50.4/50.4 1.8 28/28 6X, 10X  Axilla, Left: Axilla_Lt 3D 50.4/50.4 1.8 28/28 6X, 15X   Narrative: The patient tolerated radiation therapy relatively well. She reports a significant amount of ongoing fatigue which is frustrating her and causing some mild depression, skin irritation (using lotion), rash, and skin hyperpigmentation.   On physical exam, the reconstructed left breast area shows some erythema and hyperpigmentation changes.  No signs of moist desquamation. She is on Zoloft for her depression.    Of note: the patient's treatment period was prolonged following her treatment on 07/08/21 due to significant fatigue.   Plan: The patient will follow-up with radiation oncology in one month .  ________________________________________________ -----------------------------------  Blair Promise, PhD, MD  This document serves as a record of services personally performed by [name], MD. It was created on his behalf by Roney Mans, a trained medical scribe. The creation of this record is based on the scribe's personal observations and the provider's  statements to them. This document has been checked and approved by the attending provider.

## 2021-08-13 NOTE — Progress Notes (Signed)
Radiation Oncology         [phone] ________________________________  Name: Tracy Dyer MRN: [id number]  Date: 08/14/2021  DOB: [birth year]  Follow-Up Visit Note  CC: [name], MD  [name], MD    ICD-10-CM   1. Malignant neoplasm of upper-outer quadrant of left breast in female, estrogen receptor positive ([place])  C50.412    Z17.0       Diagnosis:  Status post left lumpectomy: Left Breast UOQ, No residual IDC or DCIS identified, ER+ / PR+ / Her2-  (ypT0, ypN0)   Stage IIA (cT1c, cN1, cM0) Left Breast UOQ, Invasive Ductal Carcinoma, ER+ / PR+ / Her2-, Grade 3  Interval Since Last Radiation: 1 month and 4 days   Intent: Curative  Radiation Treatment Dates: 05/27/2021 through 07/10/2021 Site Technique Total Dose (Gy) Dose per Fx (Gy) Completed Fx Beam Energies  Breast, Left: Breast_Lt 3D 50.4/50.4 1.8 28/28 6X, 10X  Axilla, Left: Axilla_Lt 3D 50.4/50.4 1.8 28/28 6X, 15X    Narrative:  The patient returns today for routine follow-up. The patient tolerated radiation therapy relatively well. She did report a significant amount of ongoing fatigue during her final week of treatment which was frustrating for her and caused some mild depression, skin irritation (for which she used lotion), rash, and skin hyperpigmentation. Physical exam performed during her final weekly treatment check revealed the reconstructed left breast area to show some erythema and hyperpigmentation changes.  No signs of moist desquamation were noted. She is on Zoloft for her depression. The patient's treatment period was prolonged following her treatment on 07/08/21 due to her significant fatigue.                  Since she was last seen for re-evaluation on 05/19/21, the patient was referred to Dr. Mickeal Skinner on 07/01/21 for evaluation of her neurologic complaints. The patient reported a several year (3-5 at least) history of both vertigo and neck and back of head pain, which she noted as possibly  worse due to RT. Following evaluation, Dr. Mickeal Skinner noted the patient's neck and headache syndromes as consistent with right greater occipital neuralgia, for which the patient was recommended to meet with Dr. Davy Pique at [facility] for nerve block procedure. 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 Mickeal Skinner recommended a referral to ENT for VNG testing and positioning maneuvers (if indicated).   The patient also followed up with Dr. Lindi Adie on 07/07/21 to discuss adjuvant antiestrogen therapy. Following discussion of the risks and benefits, the patient consented to starting treatment; planned treatment duration is 10 years. Dr. Lindi Adie requested to follow up with the patient in 1 month following evaluation of [place] and estradiol levels to discuss if she will go on tamoxifen versus letrozole.   Accordingly, the patient again met with Dr. Lindi Adie on 08/08/21 to discuss antiestrogen treatment options further. Following discussion, the patient agreed to begin antiestrogen treatment consisting of Letrozole starting on 08/08/21 (Labs showed [place] [address], Estradiol 4, performed on on 07/07/21)  She reports significant ongoing fatigue.  She denies any significant pain within the left breast or axillary region.  She denies any significant problems with left arm mobility or swelling in her left arm or hand.  She reports a feeling of cording in her left axillary area but with her self massaging this resolves.   Allergies:  is allergic to sulfa antibiotics and sulfamethoxazole.  Meds: Current Outpatient Medications  Medication Sig Dispense Refill  calcium carbonate (TUMS - DOSED IN MG ELEMENTAL CALCIUM) 500 MG chewable tablet Chew 4 tablets by mouth as needed for indigestion or heartburn.     clonazePAM (KLONOPIN) 1 MG tablet TAKE 1 TABLET BY MOUTH TWICE A DAY AS NEEDED FOR ANXIETY 60 tablet 1   fluticasone (FLONASE) 50 MCG/ACT nasal spray Place 1 spray  into both nostrils daily as needed for allergies.     l-methylfolate-B6-B12 (METANX) 3-35-2 MG TABS tablet Take 1 tablet by mouth daily.     letrozole (FEMARA) 2.5 MG tablet Take 1 tablet (2.5 mg total) by mouth daily. 90 tablet 3   LORazepam (ATIVAN) 0.5 MG tablet TAKE 1 TABLET BY MOUTH TWICE A DAY FOR NAUSEA OR ANXIETY. DO NOT TAKE AT THE SAME TIME AS CLONAZEPAM 40 tablet 0   MAGNESIUM CITRATE PO Take 405 mg by mouth daily. 135 mg     Multiple Vitamins-Minerals (MULTIVITAMIN WITH MINERALS) tablet Take 1 tablet by mouth daily.     oxyCODONE (ROXICODONE) 5 MG immediate release tablet Take 1 tablet (5 mg total) by mouth every 4 (four) hours as needed. 20 tablet 0   pseudoephedrine (SUDAFED) 30 MG tablet Take 30 mg by mouth one time in dialysis.     sertraline (ZOLOFT) 100 MG tablet Take 100 mg by mouth at bedtime.     albuterol (VENTOLIN HFA) 108 (90 Base) MCG/ACT inhaler Inhale 1-2 puffs into the lungs every 4 (four) hours as needed for wheezing or shortness of breath. (Patient not taking: Reported on 05/19/2021) 8 g 2   gabapentin (NEURONTIN) 300 MG capsule Take 2 capsules (600 mg total) by mouth at bedtime. (Patient not taking: Reported on 08/14/2021) 180 capsule 3   meloxicam (MOBIC) 7.5 MG tablet Take 1 tablet (7.5 mg total) by mouth daily as needed for pain. (Patient not taking: Reported on 08/14/2021) 14 tablet 0   omeprazole (PRILOSEC) 20 MG capsule Take 1 capsule (20 mg total) by mouth daily. (Patient not taking: Reported on 05/19/2021) 30 capsule 3   No current facility-administered medications for this encounter.    Physical Findings: The patient is in no acute distress. Patient is alert and oriented.  height is 5' 6"  (1.676 m) and weight is 199 lb 3.2 oz (90.4 kg). Her temperature is 97.8 F (36.6 C). Her blood pressure is 113/70 and her pulse is 64. Her respiration is 20 and oxygen saturation is 98%. .  No significant changes. Lungs are clear to auscultation bilaterally. Heart has  regular rate and rhythm. No palpable cervical, supraclavicular, or axillary adenopathy. Abdomen soft, non-tender, normal bowel sounds.  Right breast: no palpable mass, nipple discharge or bleeding.  Surgical changes noted with reduction mammoplasty.  The left chest area shows mild hyperpigmentation changes.  No significant swelling noted.  Reconstruction scars noted in the inferior and nipple area related to her reduction mammoplasty.  Skin is healed well.  No palpable or visible signs of recurrence.    Lab Findings: Lab Results  Component Value Date   WBC 3.2 (L) 01/17/2021   HGB 12.4 01/17/2021   HCT 35.8 (L) 01/17/2021   MCV 92.0 01/17/2021   PLT 214 01/17/2021    Radiographic Findings: No results found.  Impression:  Status post left lumpectomy: Left Breast UOQ, No residual IDC or DCIS identified, ER+ / PR+ / Her2-  (ypT0, ypN0)   Stage IIA (cT1c, cN1, cM0) Left Breast UOQ, Invasive Ductal Carcinoma, ER+ / PR+ / Her2-, Grade 3  The patient is recovering from the effects  of radiation.  Her skin has healed very well and she does not have any significant lymphedema at this point.  Fatigue still continues to be a significant issue for her but hopefully after completion of the all of the therapy last year this will begin to improve.  She does report significant hot flashes but hopefully this will improve the longer she has been on letrozole.  The patient is postmenopausal based on recent [facility] and estradiol levels  Plan: As needed follow-up in radiation oncology.  She will continue close follow-up in medical oncology.  She reports that she was undergoing a nipple sparing mastectomy in the summer bilaterally.  This will be followed by TRAM reconstruction.  Her surgery will be performed in the [place] area.    ____________________________________  Blair Promise, PhD, MD  This document serves as a record of services personally performed by [name], MD. It was created on his behalf  by Roney Mans, a trained medical scribe. The creation of this record is based on the scribe's personal observations and the provider's statements to them. This document has been checked and approved by the attending provider.

## 2021-08-14 ENCOUNTER — Encounter: Payer: Self-pay | Admitting: Radiation Oncology

## 2021-08-14 ENCOUNTER — Other Ambulatory Visit: Payer: Self-pay

## 2021-08-14 ENCOUNTER — Ambulatory Visit
Admission: RE | Admit: 2021-08-14 | Discharge: 2021-08-14 | Disposition: A | Discharge status: Home / Self Care | Payer: BC Managed Care – PPO | Source: Ambulatory Visit | Attending: Radiation Oncology | Admitting: Radiation Oncology

## 2021-08-14 DIAGNOSIS — Z79899 Other long term (current) drug therapy: Secondary | ICD-10-CM | POA: Diagnosis not present

## 2021-08-14 DIAGNOSIS — R21 Rash and other nonspecific skin eruption: Secondary | ICD-10-CM | POA: Insufficient documentation

## 2021-08-14 DIAGNOSIS — C50412 Malignant neoplasm of upper-outer quadrant of left female breast: Secondary | ICD-10-CM | POA: Insufficient documentation

## 2021-08-14 DIAGNOSIS — Z79811 Long term (current) use of aromatase inhibitors: Secondary | ICD-10-CM | POA: Insufficient documentation

## 2021-08-14 DIAGNOSIS — Z170 Estrogen receptor positive status [ER+]: Secondary | ICD-10-CM | POA: Insufficient documentation

## 2021-08-14 DIAGNOSIS — Z791 Long term (current) use of non-steroidal anti-inflammatories (NSAID): Secondary | ICD-10-CM | POA: Insufficient documentation

## 2021-08-14 DIAGNOSIS — R5383 Other fatigue: Secondary | ICD-10-CM | POA: Insufficient documentation

## 2021-08-14 DIAGNOSIS — Y842 Radiological procedure and radiotherapy as the cause of abnormal reaction of the patient, or of later complication, without mention of misadventure at the time of the procedure: Secondary | ICD-10-CM | POA: Insufficient documentation

## 2021-08-14 HISTORY — DX: Personal history of irradiation: Z92.3

## 2021-08-14 NOTE — Progress Notes (Signed)
Tracy Dyer Tracy Dyer is here today for follow up post radiation to the breast.   Breast Side: Left   They completed their radiation on: 07/10/21  Does the patient complain of any of the following: Post radiation skin issues: Patient reports skin has healed. Breast Tenderness: Patient reports having pain to left breast. Patient reports taking Tylenol, Advil, and CBD.   Breast Swelling: No Lymphadema: no Range of Motion limitations: Patient  reports feeling like she has cording under left axilla.  Fatigue post radiation: Patient continues to have a low energy level.  Appetite good/fair/poor: Good  Additional comments if applicable:  Patient reports having generalized pain, rating 8/10. Patient reports having issues with ambulation. Reports having a possible stress fracture and planter fascitis to right foot. Vitals:   08/14/21 0938  BP: 113/70  Pulse: 64  Resp: 20  Temp: 97.8 F (36.6 C)  SpO2: 98%  Weight: 199 lb 3.2 oz (90.4 kg)  Height: 5\' 6"  (1.676 m)

## 2021-08-21 DIAGNOSIS — K625 Hemorrhage of anus and rectum: Secondary | ICD-10-CM | POA: Diagnosis not present

## 2021-08-21 DIAGNOSIS — K635 Polyp of colon: Secondary | ICD-10-CM | POA: Diagnosis not present

## 2021-08-21 DIAGNOSIS — K648 Other hemorrhoids: Secondary | ICD-10-CM | POA: Diagnosis not present

## 2021-08-21 DIAGNOSIS — K5730 Diverticulosis of large intestine without perforation or abscess without bleeding: Secondary | ICD-10-CM | POA: Diagnosis not present

## 2021-08-26 DIAGNOSIS — M47892 Other spondylosis, cervical region: Secondary | ICD-10-CM | POA: Diagnosis not present

## 2021-08-26 DIAGNOSIS — R519 Headache, unspecified: Secondary | ICD-10-CM | POA: Diagnosis not present

## 2021-08-26 DIAGNOSIS — R278 Other lack of coordination: Secondary | ICD-10-CM | POA: Diagnosis not present

## 2021-08-26 DIAGNOSIS — H8113 Benign paroxysmal vertigo, bilateral: Secondary | ICD-10-CM | POA: Diagnosis not present

## 2021-08-29 ENCOUNTER — Encounter: Payer: Self-pay | Admitting: Hematology and Oncology

## 2021-09-03 ENCOUNTER — Telehealth: Payer: Self-pay | Admitting: Adult Health

## 2021-09-03 NOTE — Telephone Encounter (Signed)
Sch per 1/30 inbasket, pt aware

## 2021-09-04 ENCOUNTER — Encounter: Payer: Self-pay | Admitting: Adult Health

## 2021-09-04 ENCOUNTER — Inpatient Hospital Stay: Payer: BC Managed Care – PPO | Attending: Adult Health | Admitting: Adult Health

## 2021-09-04 ENCOUNTER — Inpatient Hospital Stay: Payer: BC Managed Care – PPO

## 2021-09-04 ENCOUNTER — Other Ambulatory Visit: Payer: Self-pay

## 2021-09-04 VITALS — BP 116/53 | HR 54 | Temp 97.7°F | Resp 18 | Wt 204.2 lb

## 2021-09-04 DIAGNOSIS — Z170 Estrogen receptor positive status [ER+]: Secondary | ICD-10-CM | POA: Insufficient documentation

## 2021-09-04 DIAGNOSIS — Z801 Family history of malignant neoplasm of trachea, bronchus and lung: Secondary | ICD-10-CM | POA: Diagnosis not present

## 2021-09-04 DIAGNOSIS — C50412 Malignant neoplasm of upper-outer quadrant of left female breast: Secondary | ICD-10-CM | POA: Diagnosis not present

## 2021-09-04 DIAGNOSIS — Z8041 Family history of malignant neoplasm of ovary: Secondary | ICD-10-CM | POA: Diagnosis not present

## 2021-09-04 DIAGNOSIS — Z803 Family history of malignant neoplasm of breast: Secondary | ICD-10-CM | POA: Diagnosis not present

## 2021-09-04 DIAGNOSIS — Z808 Family history of malignant neoplasm of other organs or systems: Secondary | ICD-10-CM | POA: Diagnosis not present

## 2021-09-04 DIAGNOSIS — I890 Lymphedema, not elsewhere classified: Secondary | ICD-10-CM | POA: Diagnosis not present

## 2021-09-04 LAB — CMP (CANCER CENTER ONLY)
ALT: 25 U/L (ref 0–44)
AST: 25 U/L (ref 15–41)
Albumin: 4.2 g/dL (ref 3.5–5.0)
Alkaline Phosphatase: 82 U/L (ref 38–126)
Anion gap: 3 — ABNORMAL LOW (ref 5–15)
BUN: 15 mg/dL (ref 6–20)
CO2: 28 mmol/L (ref 22–32)
Calcium: 9.5 mg/dL (ref 8.9–10.3)
Chloride: 107 mmol/L (ref 98–111)
Creatinine: 0.84 mg/dL (ref 0.44–1.00)
GFR, Estimated: >60 mL/min (ref >60)
Glucose, Bld: 90 mg/dL (ref 70–99)
Potassium: 4.8 mmol/L (ref 3.5–5.1)
Sodium: 138 mmol/L (ref 135–145)
Total Bilirubin: 0.4 mg/dL (ref 0.3–1.2)
Total Protein: 6.6 g/dL (ref 6.5–8.1)

## 2021-09-04 LAB — CBC WITH DIFFERENTIAL (CANCER CENTER ONLY)
Abs Immature Granulocytes: 0.01 10*3/uL (ref 0.00–0.07)
Basophils Absolute: 0.1 10*3/uL (ref 0.0–0.1)
Basophils Relative: 2 %
Eosinophils Absolute: 0.2 10*3/uL (ref 0.0–0.5)
Eosinophils Relative: 3 %
HCT: 38.7 % (ref 36.0–46.0)
Hemoglobin: 13.2 g/dL (ref 12.0–15.0)
Immature Granulocytes: 0 %
Lymphocytes Relative: 21 %
Lymphs Abs: 1 10*3/uL (ref 0.7–4.0)
MCH: 30.2 pg (ref 26.0–34.0)
MCHC: 34.1 g/dL (ref 30.0–36.0)
MCV: 88.6 fL (ref 80.0–100.0)
Monocytes Absolute: 0.5 10*3/uL (ref 0.1–1.0)
Monocytes Relative: 11 %
Neutro Abs: 2.9 10*3/uL (ref 1.7–7.7)
Neutrophils Relative %: 63 %
Platelet Count: 192 10*3/uL (ref 150–400)
RBC: 4.37 MIL/uL (ref 3.87–5.11)
RDW: 14.3 % (ref 11.5–15.5)
WBC Count: 4.6 10*3/uL (ref 4.0–10.5)
nRBC: 0 % (ref 0.0–0.2)

## 2021-09-04 NOTE — Assessment & Plan Note (Addendum)
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%, PR 95%, HER2 equivocal, FISH pending, Ki-67 20% T1CN1 stage IIa MammaPrint: High risk: Luminal type B, probability of path CR 6% with chemo and antiestrogen therapy predicted benefit of treatment at 5 years 94.6%, average 10-year risk of recurrence untreated: 29%  Treatment plan: 1.Neoadjuvant chemotherapy with dose dense Adriamycin and Cytoxan followed by Taxol weekly x12completed 01/17/2021 2.7/27/2022breast conserving surgery with sentinel lymph node and targeted node dissection,: Path CR 0/4 LN Neg 04/18/21: Bilateral Mammoplasty: Benign 3.Adjuvant radiation therapy 05/28/21- 07/10/21 4.Followed by adjuvant antiestrogen therapy with tamoxifen--unable to tolerate, on Letrozole daily ------------------------------------------------------------------------------------------------------------------------- Tracy Dyer is here for f/u and evaluation of a rash, swelling and itching she developed after taking Letrozole.  This has resolved and she never stopped taking letrozole, so the letrozole likely is not what caused it.  She is however tearful today and notes that she has been crying and feeling more emotional than usual.  She also says that she feels old and achy particularly first thing in the morning and she is struggling to get back to work and reclaim her work identity.  I suggested she temporarily stop Letrozole for a couple of weeks and see if she starts to feel better.  If so, we can send in Exemestane for her to take, which she may tolerate better.    She does have a thickened area on her right posterolateral chest wall that feels like a deep soft tissue mass.  I have  ordered CT chest/abd/pelvis to further evaluate.    I also placed a PT referral for her left breast lymphedema.  We will touch base with [name] in 2 weeks for f/u.  She knows to call for any questions that may arise between now and her next appointment.  We are happy to see her sooner if needed.

## 2021-09-04 NOTE — Progress Notes (Signed)
[name]:    Tracy Pepper, MD [place] [address] [place] [address]   DIAGNOSIS:  Cancer Staging  Malignant neoplasm of upper-outer quadrant of left breast in female, estrogen receptor positive ([place]) Staging form: Breast, AJCC 8th Edition - Clinical stage from 08/21/2020: Stage IIA (cT1c, cN1, cM0, G3, ER+, PR+, HER2-) - Signed by [name], MD on 08/21/2020   SUMMARY OF ONCOLOGIC HISTORY: Oncology History  Malignant neoplasm of upper-outer quadrant of left breast in female, estrogen receptor positive ([place])  08/21/2020 Initial Diagnosis   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 No pathogenic variants identified on the [facility] Breast Cancer STAT Panel + Multi-Cancer Panel. VUS in [facility] called c.983C>T identified. The report date is 09/04/2020.   The STAT Breast cancer panel offered by [facility] includes sequencing and rearrangement analysis for the following 9 genes:  ATM, BRCA1, BRCA2, CDH1, CHEK2, PALB2, PTEN, STK11 and TP53.    The Multi-Cancer Panel offered by [facility] includes sequencing and/or deletion duplication testing of the following 85 genes: AIP, ALK, APC, ATM, AXIN2,BAP1,  BARD1, BLM, BMPR1A, BRCA1, BRCA2, BRIP1, CASR, CDC73, CDH1, CDK4, CDKN1B, CDKN1C, CDKN2A (p14ARF), CDKN2A (p16INK4a), CEBPA, CHEK2, CTNNA1, DICER1, DIS3L2, EGFR (c.2369C>T,  p.Thr790Met variant only), EPCAM (Deletion/duplication testing only), FH, FLCN, GATA2, GPC3, GREM1 (Promoter region deletion/duplication testing only), HOXB13 (c.251G>A, p.Gly84Glu), HRAS, KIT, MAX, MEN1, MET, MITF (c.952G>A, p.Glu318Lys variant only), MLH1, MSH2, MSH3, MSH6, MUTYH, NBN, NF1, NF2, NTHL1, PALB2, PDGFRA, PHOX2B, PMS2, POLD1, POLE, POT1, PRKAR1A, PTCH1, PTEN, RAD50, RAD51C, RAD51D, RB1, RECQL4, RET, RNF43, RUNX1, SDHAF2, SDHA (sequence changes only), SDHB, SDHC, SDHD, SMAD4, SMARCA4, SMARCB1, SMARCE1, STK11, SUFU, TERC, TERT, TMEM127, TP53, TSC1, TSC2, VHL, WRN and WT1.    09/06/2020 - 01/17/2021 Chemotherapy   AC X 4 foll by Taxol       02/26/2021 Surgery   Left lumpectomy: no residual carcinoma with margins and axillary lymph nodes also negative for carcinoma       CURRENT THERAPY: Letrozole  INTERVAL HISTORY: Tracy Dyer 51 y.o. female returns for evaluation of rash that began after she started letrozole. She also started meloxicam at that time as well. She developed redness, swelling and itching on her chest/arms, face and had no other new medications or lotions/body products during that time.  She did not receive our message about stopping letrozole and has continued on it and Meloxicam and the redness and swelling has resolved.    Tracy Dyer has also experienced fatigue, breast pain, swelling, a knot in her right posterior chest wall and increased emotional sensitivity.  Patient Active Problem List   Diagnosis Date Noted   Occipital neuralgia of right side 07/01/2021   Peripheral vertigo 07/01/2021   Port-A-Cath in place 09/20/2020   Genetic testing 09/04/2020   Family history of breast cancer    Family history of ovarian cancer    Family history of lung cancer  Malignant neoplasm of upper-outer quadrant of left breast in female, estrogen receptor positive ([place]) 08/21/2020   Anxiety 11/16/2013   Hypercoagulable state ([place]) 11/16/2013   TIA (transient ischemic  attack) 11/15/2013   Chest discomfort 01/20/2012   Factor V Leiden ([place]) 01/20/2012   MVP (mitral valve prolapse) 01/20/2012   Acute bronchitis 12/23/2011    is allergic to sulfa antibiotics and sulfamethoxazole.  MEDICAL HISTORY: Past Medical History:  Diagnosis Date   Anxiety    Cancer ([place])    breast   Depression    Family history of breast cancer    Family history of lung cancer    Family history of ovarian cancer    GERD (gastroesophageal reflux disease)    diet controlled   History of radiation therapy    Left breast, left axilla- 05/27/21-07/10/21- Dr. [name]   Seasonal allergies     SURGICAL HISTORY: Past Surgical History:  Procedure Laterality Date   BREAST BIOPSY Left 2019   x 2 biopsy   BREAST EXCISIONAL BIOPSY Left    BREAST LUMPECTOMY WITH RADIOACTIVE SEED AND SENTINEL LYMPH NODE BIOPSY Left 02/26/2021   Procedure: LEFT BREAST LUMPECTOMY WITH RADIOACTIVE SEED x2; LEFT AXILLARY SENTINEL NODE BIOPSY;  Surgeon: [name], MD;  Location: [place];  Service: General;  Laterality: Left;   BREAST LUMPECTOMY WITH RADIOFREQUENCY TAG IDENTIFICATION Left 08/04/5850   Procedure: LEFT BREAST MRI WIRE GUIDED EXCISION;  Surgeon: [name], MD;  Location: [place];  Service: General;  Laterality: Left;   BREAST REDUCTION SURGERY Bilateral 04/18/2021   Procedure: MAMMARY REDUCTION  (BREAST) LEFT ONCOPLASTIC BREAST RECONSTRUCTION, RIGHT BREAST REDUCTION;  Surgeon: Irene Limbo, MD;  Location: [place];  Service: Plastics;  Laterality: Bilateral;   CHOLECYSTECTOMY  2008   PORT-A-CATH REMOVAL Right 04/18/2021   Procedure: REMOVAL PORT-A-CATH;  Surgeon: [name], MD;  Location: [place];  Service: General;  Laterality: Right;   PORTACATH PLACEMENT N/A 09/05/2020   Procedure: INSERTION PORT-A-CATH;  Surgeon: [name], MD;  Location: [facility];  Service: General;  Laterality:  N/A;  PEC BLOCK   WISDOM TOOTH EXTRACTION      SOCIAL HISTORY: Social History   Socioeconomic History   Marital status: Married    Spouse name: Not on file   Number of children: Not on file   Years of education: Not on file   Highest education level: Not on file  Occupational History   Not on file  Tobacco Use   Smoking status: Never   Smokeless tobacco: Never  Vaping Use   Vaping Use: Never used  Substance and Sexual Activity   Alcohol use: Not Currently   Drug use: Never   Sexual activity: Yes    Birth control/protection: None  Other Topics Concern   Not on file  Social History Narrative   Not on file   Social Determinants of Health   Financial Resource Strain: Not on file  Food Insecurity: No Food Insecurity   Worried About Running Out of Food in the Last Year: Never true   Ran Out of Food in the Last Year: Never true  Transportation Needs: No Transportation Needs   Lack of Transportation (Medical): No   Lack of Transportation (Non-Medical): No  Physical Activity: Not on file  Stress: Not on file  Social Connections: Not on file  Intimate Partner Violence: Not on file    FAMILY HISTORY: Family History  Problem Relation Age of Onset   Eczema Father  Allergic rhinitis Brother    Breast cancer Mother        in 49's   Cancer Maternal Uncle        unk type   Lung cancer Maternal Grandfather 42   Heart Problems Paternal Grandfather    Melanoma Cousin    Ovarian cancer Paternal Great-grandmother    Cancer Paternal Great-grandmother        GYN cancer, possibly ovarian?   Cancer Maternal Aunt        half aunts/uncles x5- rare cancers    Review of Systems  Constitutional:  Positive for fatigue. Negative for appetite change, chills, fever and unexpected weight change.  HENT:   Negative for hearing loss, lump/mass and trouble swallowing.   Eyes:  Negative for eye problems and icterus.  Respiratory:  Negative for chest tightness, cough and shortness of  breath.   Cardiovascular:  Negative for chest pain, leg swelling and palpitations.  Gastrointestinal:  Negative for abdominal distention, abdominal pain, constipation, diarrhea, nausea and vomiting.  Endocrine: Negative for hot flashes.  Genitourinary:  Negative for difficulty urinating.   Musculoskeletal:  Negative for arthralgias.  Skin:  Negative for itching and rash.  Neurological:  Negative for dizziness, extremity weakness, headaches and numbness.  Hematological:  Negative for adenopathy. Does not bruise/bleed easily.  Psychiatric/Behavioral:  Negative for depression. The patient is nervous/anxious.      PHYSICAL EXAMINATION  ECOG PERFORMANCE STATUS: 1 - Symptomatic but completely ambulatory  Vitals:   09/04/21 1101  BP: (!) 116/53  Pulse: (!) 54  Resp: 18  Temp: 97.7 F (36.5 C)  SpO2: 99%    Physical Exam Constitutional:      General: She is not in acute distress.    Appearance: Normal appearance. She is not toxic-appearing.  HENT:     Head: Normocephalic and atraumatic.  Eyes:     General: No scleral icterus. Cardiovascular:     Rate and Rhythm: Normal rate and regular rhythm.     Pulses: Normal pulses.     Heart sounds: Normal heart sounds.  Pulmonary:     Effort: Pulmonary effort is normal.     Breath sounds: Normal breath sounds.  Abdominal:     General: Abdomen is flat. Bowel sounds are normal. There is no distension.     Palpations: Abdomen is soft.     Tenderness: There is no abdominal tenderness.  Musculoskeletal:        General: No swelling.     Cervical back: Neck supple.  Lymphadenopathy:     Cervical: No cervical adenopathy.  Skin:    General: Skin is warm and dry.     Findings: No rash.  Neurological:     General: No focal deficit present.     Mental Status: She is alert.  Psychiatric:        Mood and Affect: Mood normal.        Behavior: Behavior normal.    LABORATORY DATA:  CBC    Component Value Date/Time   WBC 4.6 09/04/2021  1154   WBC 3.2 (L) 01/17/2021 1015   RBC 4.37 09/04/2021 1154   HGB 13.2 09/04/2021 1154   HCT 38.7 09/04/2021 1154   PLT 192 09/04/2021 1154   MCV 88.6 09/04/2021 1154   MCH 30.2 09/04/2021 1154   MCHC 34.1 09/04/2021 1154   RDW 14.3 09/04/2021 1154   LYMPHSABS 1.0 09/04/2021 1154   MONOABS 0.5 09/04/2021 1154   EOSABS 0.2 09/04/2021 1154   BASOSABS 0.1 09/04/2021 1154  CMP     Component Value Date/Time   NA 138 09/04/2021 1154   K 4.8 09/04/2021 1154   CL 107 09/04/2021 1154   CO2 28 09/04/2021 1154   GLUCOSE 90 09/04/2021 1154   BUN 15 09/04/2021 1154   CREATININE 0.84 09/04/2021 1154   CALCIUM 9.5 09/04/2021 1154   PROT 6.6 09/04/2021 1154   ALBUMIN 4.2 09/04/2021 1154   AST 25 09/04/2021 1154   ALT 25 09/04/2021 1154   ALKPHOS 82 09/04/2021 1154   BILITOT 0.4 09/04/2021 1154   GFRNONAA >60 09/04/2021 1154     ASSESSMENT and THERAPY PLAN:   Malignant neoplasm of upper-outer quadrant of left breast in female, estrogen receptor positive ([place]) 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  Neoadjuvant chemotherapy with dose dense Adriamycin and Cytoxan followed by Taxol weekly x12 completed 01/17/2021 2. 02/26/2021 breast conserving surgery with sentinel lymph node and targeted node dissection,: Path CR 0/4 LN Neg 04/18/21: Bilateral Mammoplasty: Benign 3.  Adjuvant radiation therapy 05/28/21- 07/10/21 4.   Followed by adjuvant antiestrogen therapy with tamoxifen--unable to tolerate, on Letrozole daily ------------------------------------------------------------------------------------------------------------------------- Tracy Dyer is here for f/u and evaluation of a rash, swelling and itching she developed after taking Letrozole.  This has resolved and she never stopped taking letrozole, so the letrozole likely is not what caused it.  She is however tearful today and notes that she has been crying and feeling more emotional than usual.  She also says that she feels old and achy particularly first thing in the morning and she is struggling to get back to work and reclaim her work identity.  I suggested she temporarily stop Letrozole for a couple of weeks and see if she starts to feel better.  If so, we can send in Exemestane for her to take, which she may tolerate better.    She does have a thickened area on her right posterolateral chest wall that feels like a deep soft tissue mass.  I have ordered CT chest/abd/pelvis to further evaluate.    I also placed a PT referral for her left breast lymphedema.  We will touch base with [place] in 2 weeks for f/u.  She knows to call for any questions that may arise between now and her next appointment.  We are happy to see her sooner if needed.  Total encounter time: 30 minutes in face to face visit time, chart review, lab review,care coordination, and documentation of the encounter.    Wilber Bihari, NP 09/04/21 9:56 PM Medical Oncology and Hematology [facility] [address] [place], [place] [address] Tel. [phone]    Fax. [phone]  *Total Encounter Time as defined by the Centers for Medicare and Medicaid Services includes, in addition to the face-to-face time of a patient visit (documented in the note above) non-face-to-face time: obtaining and reviewing outside history, ordering and reviewing medications, tests or procedures, care  coordination (communications with other health care professionals or caregivers) and documentation in  the medical record.

## 2021-09-06 ENCOUNTER — Encounter: Payer: Self-pay | Admitting: Hematology and Oncology

## 2021-09-07 ENCOUNTER — Other Ambulatory Visit: Payer: Self-pay | Admitting: Hematology and Oncology

## 2021-09-07 DIAGNOSIS — R110 Nausea: Secondary | ICD-10-CM

## 2021-09-08 ENCOUNTER — Other Ambulatory Visit: Payer: Self-pay | Admitting: *Deleted

## 2021-09-08 MED ORDER — SERTRALINE HCL 100 MG PO TABS: 100.0000 mg | ORAL_TABLET | Freq: Every day | ORAL | 0 refills | Status: DC

## 2021-09-11 ENCOUNTER — Ambulatory Visit: Payer: BC Managed Care – PPO | Attending: Adult Health

## 2021-09-11 ENCOUNTER — Other Ambulatory Visit: Payer: Self-pay

## 2021-09-11 ENCOUNTER — Other Ambulatory Visit: Payer: Self-pay | Admitting: Adult Health

## 2021-09-11 DIAGNOSIS — R293 Abnormal posture: Secondary | ICD-10-CM | POA: Insufficient documentation

## 2021-09-11 DIAGNOSIS — M79671 Pain in right foot: Secondary | ICD-10-CM | POA: Insufficient documentation

## 2021-09-11 DIAGNOSIS — Z170 Estrogen receptor positive status [ER+]: Secondary | ICD-10-CM | POA: Diagnosis not present

## 2021-09-11 DIAGNOSIS — R600 Localized edema: Secondary | ICD-10-CM | POA: Insufficient documentation

## 2021-09-11 DIAGNOSIS — C50412 Malignant neoplasm of upper-outer quadrant of left female breast: Secondary | ICD-10-CM | POA: Diagnosis not present

## 2021-09-11 NOTE — Patient Instructions (Signed)
Pt given illustrated instructions of abd wall slide, stargazer on left only, standing counter stretch

## 2021-09-11 NOTE — Therapy (Signed)
OUTPATIENT PHYSICAL THERAPY ONCOLOGY EVALUATION  Patient Name: Tracy Dyer MRN: [id number] DOB:Dec 19, 1970, 51 y.o., female Today's Date: 09/11/2021   PT End of Session - 09/11/21 0853     Visit Number 1    Number of Visits 12    Date for PT Re-Evaluation 10/23/21    PT Start Time 0805    PT Stop Time 0849    PT Time Calculation (min) 44 min    Activity Tolerance Patient tolerated treatment well    Behavior During Therapy [facility] for tasks assessed/performed             Past Medical History:  Diagnosis Date   Anxiety    Cancer ([place])    breast   Depression    Family history of breast cancer    Family history of lung cancer    Family history of ovarian cancer    GERD (gastroesophageal reflux disease)    diet controlled   History of radiation therapy    Left breast, left axilla- 05/27/21-07/10/21- Dr. [name]   Seasonal allergies    Past Surgical History:  Procedure Laterality Date   BREAST BIOPSY Left 2019   x 2 biopsy   BREAST EXCISIONAL BIOPSY Left    BREAST LUMPECTOMY WITH RADIOACTIVE SEED AND SENTINEL LYMPH NODE BIOPSY Left 02/26/2021   Procedure: LEFT BREAST LUMPECTOMY WITH RADIOACTIVE SEED x2; LEFT AXILLARY SENTINEL NODE BIOPSY;  Surgeon: [name], MD;  Location: [facility];  Service: General;  Laterality: Left;   BREAST LUMPECTOMY WITH RADIOFREQUENCY TAG IDENTIFICATION Left 8/52/7782   Procedure: LEFT BREAST MRI WIRE GUIDED EXCISION;  Surgeon: [name], MD;  Location: [place];  Service: General;  Laterality: Left;   BREAST REDUCTION SURGERY Bilateral 04/18/2021   Procedure: MAMMARY REDUCTION  (BREAST) LEFT ONCOPLASTIC BREAST RECONSTRUCTION, RIGHT BREAST REDUCTION;  Surgeon: Irene Limbo, MD;  Location: [place];  Service: Plastics;  Laterality: Bilateral;   CHOLECYSTECTOMY  2008   PORT-A-CATH REMOVAL Right 04/18/2021   Procedure: REMOVAL PORT-A-CATH;  Surgeon: [name],  MD;  Location: [place];  Service: General;  Laterality: Right;   PORTACATH PLACEMENT N/A 09/05/2020   Procedure: INSERTION PORT-A-CATH;  Surgeon: [name], MD;  [facility];  Service: General;  Laterality: N/A;  [employer] EXTRACTION     Patient Active Problem List   Diagnosis Date Noted   Occipital neuralgia of right side 07/01/2021   Peripheral vertigo 07/01/2021   Port-A-Cath in place 09/20/2020   Genetic testing 09/04/2020   Family history of breast cancer    Family history of ovarian cancer    Family history of lung cancer    Malignant neoplasm of upper-outer quadrant of left breast in female, estrogen receptor positive ([place]) 08/21/2020   Anxiety 11/16/2013   Hypercoagulable state ([place]) 11/16/2013   TIA (transient ischemic attack) 11/15/2013   Chest discomfort 01/20/2012   Factor V Leiden ([place]) 01/20/2012   MVP (mitral valve prolapse) 01/20/2012   Acute bronchitis 12/23/2011    PCP: [name], MD  REFERRING PROVIDER: Wilber Bihari [place]*  REFERRING DIAG: Left breast swelling/ axillary tightness  THERAPY DIAG:  Malignant neoplasm of upper-outer quadrant of left breast in female, estrogen receptor positive ([place])  Localized edema  Abnormal posture  ONSET DATE: August 11, 2021  SUBJECTIVE  SUBJECTIVE STATEMENT:  pt started to feel tightness/cording in left axillary region after radiation ended.  It feels like it is drawing in. I have been stretching periodically throughout the day. Mild swelling noted underneath the left breast.  She wears a sports bra usually.  Pt is pending a double mastectomy this summer.  PERTINENT HISTORY:   PAIN:  Are you having pain? Yes NPRS scale: 4/10 Pain location: left lateral breast,axilla Pain  orientation: Left and Other: lateral breast   PAIN TYPE: sharp Pain description: intermittent and sharp  Aggravating factors: reaching behind, overhead Relieving factors: chest stretch,occasional advil  PRECAUTIONS: Shoulder, Right RTC tear, lymphedema, Factor 5 Leiden,anxiety, TIA  WEIGHT BEARING RESTRICTIONS No  FALLS:  Has patient fallen in last 6 months? No, Number of falls: 0  LIVING ENVIRONMENT: Lives with: lives with their family Lives in: House/apartment  Has following equipment at home: None  OCCUPATION: Chief of Staff, FT  LEISURE: outdoors walking, hiking,but has not returned yet  HAND DOMINANCE : right   PRIOR LEVEL OF FUNCTION: Independent  PATIENT GOALS To feel good, decrease pain and tenderness, improve shoulder ROM   OBJECTIVE  COGNITION:  Overall cognitive status: Within functional limits for tasks assessed   PALPATION: Very tender bilateral UT, pecs, lats greatest on left and scapular region  OBSERVATIONS / OTHER ASSESSMENTS: bilateral breast incisions healed, several small cords noted in left axillary region, no significant breast swelling  SENSATION:  Stiffness in hands  POSTURE: forward head, round shoulders, increased kyphosis  UPPER EXTREMITY AROM/PROM:  A/PROM RIGHT  09/11/2021   Shoulder extension NT secondary to RTC pain  Shoulder flexion NT  Shoulder abduction NT  Shoulder internal rotation NT  Shoulder external rotation NT    (Blank rows = not tested)  A/PROM LEFT  09/11/2021  Shoulder extension 37  Shoulder flexion 150  Shoulder abduction 134  Shoulder internal rotation 58  Shoulder external rotation 90    (Blank rows = not tested)   CERVICAL AROM: All within normal limits:    Percent limited  Flexion 10  Extension 0  Right lateral flexion 25  Left lateral flexion 20  Right rotation 60  Left rotation 30          UPPER EXTREMITY STRENGTH: NT   LYMPHEDEMA ASSESSMENTS:   SURGERY TYPE/DATE: Left lumpectomy  with axillary SLNB, 02/26/2021  NUMBER OF LYMPH NODES REMOVED: 4?  CHEMOTHERAPY: neoadjuvant chemo  RADIATION:completed  HORMONE TREATMENT: yes but presently discontinued secondary to body pain  INFECTIONS: left breast after surgery  LYMPHEDEMA ASSESSMENTS:   LANDMARK RIGHT  09/11/2021  10 cm proximal to olecranon process 32.8  Olecranon process 26.6  10 cm proximal to ulnar styloid process 24.9  Just proximal to ulnar styloid process 16.5  Across hand at thumb web space 20.3  At base of 2nd digit 6.45  (Blank rows = not tested)  LANDMARK LEFT  09/11/2021  10 cm proximal to olecranon process 33.5  Olecranon process 26.1  10 cm proximal to ulnar styloid process 24.1  Just proximal to ulnar styloid process 16.5  Across hand at thumb web space 19.6  At base of 2nd digit 6.2  (Blank rows = not tested)     FUNCTIONAL TESTS:  NA      QUICK DASH SURVEY: .qi   TODAY'S TREATMENT  Pt educated in  PATIENT EDUCATION:  Education details: all slides for abd, counter lat stretch, stargazer on left Person educated: Patient Education method: Customer service manager Education comprehension: verbalized understanding  HOME EXERCISE PROGRAM: Abd wall slides, counter lat stretch, stargazer in supine on left  ASSESSMENT:  CLINICAL IMPRESSION: Patient is a 51 y.o. female who was seen today for physical therapy evaluation and treatment for left axillary pain and upper quarter pain, tightness, cording, minimal breast swelling. Objective impairments include decreased activity tolerance, decreased knowledge of condition, decreased ROM, decreased strength, and increased fascial restrictions. These impairments are limiting patient from cleaning, reaching and normal household activities. Personal factors including 3+ comorbidities: Breast Cancer with chemo,radiation, anxiety, TIA  are also affecting patient's functional outcome. Patient will benefit from skilled PT to address above  impairments and improve overall function.  REHAB POTENTIAL: Good  CLINICAL DECISION MAKING: Stable/uncomplicated  EVALUATION COMPLEXITY: Low   GOALS: Goals reviewed with patient? Yes  SHORT TERM GOALS:  STG Name Target Date Goal status  1 Pt will be independent and compliant with HEP Baseline:  10/02/2021 INITIAL  LONG TERM GOALS:   LTG Name Target Date Goal status  1 Pt will have decreased left axillary/breast pain by 50% or greater Baseline: 10/23/2021 INITIAL  2 Pt will have improved left shoulder abduction to 160 degrees  Baseline:   10/23/2021 INITIAL  3 Pt will no longer complain of tightness with cording at end ranges Baseline: 10/23/2021 INITIAL  4 Pt will attend ABC class Baseline: 10/23/2021 INITIAL   PLAN: PT FREQUENCY: 2x/week  PT DURATION: 6 weeks  PLANNED INTERVENTIONS: Therapeutic exercises, Neuro Muscular re-education, Patient/Family education, Orthotic/Fit training, Dry Needling, scar mobilization, and Manual therapy  PLAN FOR NEXT SESSION: STM to left greater than right upper quarter, MFR to axillary cording, supine wand exs, PROM   Claris Pong, PT 09/11/2021, 8:54 AM

## 2021-09-15 ENCOUNTER — Other Ambulatory Visit: Payer: Self-pay

## 2021-09-15 ENCOUNTER — Encounter: Payer: Self-pay | Admitting: Rehabilitation

## 2021-09-15 ENCOUNTER — Ambulatory Visit: Payer: BC Managed Care – PPO | Admitting: Rehabilitation

## 2021-09-15 DIAGNOSIS — C50412 Malignant neoplasm of upper-outer quadrant of left female breast: Secondary | ICD-10-CM

## 2021-09-15 DIAGNOSIS — Z170 Estrogen receptor positive status [ER+]: Secondary | ICD-10-CM | POA: Diagnosis not present

## 2021-09-15 DIAGNOSIS — M79671 Pain in right foot: Secondary | ICD-10-CM | POA: Diagnosis not present

## 2021-09-15 DIAGNOSIS — R600 Localized edema: Secondary | ICD-10-CM | POA: Diagnosis not present

## 2021-09-15 DIAGNOSIS — R293 Abnormal posture: Secondary | ICD-10-CM | POA: Diagnosis not present

## 2021-09-15 NOTE — Therapy (Signed)
OUTPATIENT PHYSICAL THERAPY TREATMENT NOTE   Patient Name: Tracy Dyer MRN: [id number] DOB:09/12/1970, 51 y.o., female Today's Date: 09/15/2021  PCP: [name], MD REFERRING PROVIDER: Wilber Bihari [place] [facility] [place] - 09/15/21 1104     Visit Number 2    Number of Visits 12    Date for PT Re-Evaluation 10/23/21    PT Start Time 1106    Activity Tolerance Patient tolerated treatment well    Behavior During Therapy [facility] for tasks assessed/performed             Past Medical History:  Diagnosis Date   Anxiety    Cancer ([place])    breast   Depression    Family history of breast cancer    Family history of lung cancer    Family history of ovarian cancer    GERD (gastroesophageal reflux disease)    diet controlled   History of radiation therapy    Left breast, left axilla- 05/27/21-07/10/21- Dr. [name]   Seasonal allergies    Past Surgical History:  Procedure Laterality Date   BREAST BIOPSY Left 2019   x 2 biopsy   BREAST EXCISIONAL BIOPSY Left    BREAST LUMPECTOMY WITH RADIOACTIVE SEED AND SENTINEL LYMPH NODE BIOPSY Left 02/26/2021   Procedure: LEFT BREAST LUMPECTOMY WITH RADIOACTIVE SEED x2; LEFT AXILLARY SENTINEL NODE BIOPSY;  Surgeon: [name], MD;  Location: [place];  Service: General;  Laterality: Left;   BREAST LUMPECTOMY WITH RADIOFREQUENCY TAG IDENTIFICATION Left 3/66/2947   Procedure: LEFT BREAST MRI WIRE GUIDED EXCISION;  Surgeon: [name], MD;  Location: [place];  Service: General;  Laterality: Left;   BREAST REDUCTION SURGERY Bilateral 04/18/2021   Procedure: MAMMARY REDUCTION  (BREAST) LEFT ONCOPLASTIC BREAST RECONSTRUCTION, RIGHT BREAST REDUCTION;  Surgeon: Irene Limbo, MD;  Location: [place];  Service: Plastics;  Laterality: Bilateral;   CHOLECYSTECTOMY  2008   PORT-A-CATH REMOVAL Right 04/18/2021   Procedure: REMOVAL PORT-A-CATH;  Surgeon:  [name], MD;  Location: [place];  Service: General;  Laterality: Right;   PORTACATH PLACEMENT N/A 09/05/2020   Procedure: INSERTION PORT-A-CATH;  Surgeon: [name], MD;  Location: [place];  Service: General;  Laterality: N/A;  [place] EXTRACTION     Patient Active Problem List   Diagnosis Date Noted   Occipital neuralgia of right side 07/01/2021   Peripheral vertigo 07/01/2021   Port-A-Cath in place 09/20/2020   Genetic testing 09/04/2020   Family history of breast cancer    Family history of ovarian cancer    Family history of lung cancer    Malignant neoplasm of upper-outer quadrant of left breast in female, estrogen receptor positive ([place]) 08/21/2020   Anxiety 11/16/2013   Hypercoagulable state ([place]) 11/16/2013   TIA (transient ischemic attack) 11/15/2013   Chest discomfort 01/20/2012   Factor V Leiden ([place]) 01/20/2012   MVP (mitral valve prolapse) 01/20/2012   Acute bronchitis 12/23/2011    REFERRING DIAG: Left breast swelling and axillary tightness  THERAPY DIAG:  Malignant neoplasm of upper-outer quadrant of left breast in female, estrogen receptor positive ([place])  Localized edema  Abnormal posture  Adding:  Pain in Rt foot  PERTINENT HISTORY: Lt lumpectomy with SLNB 0/4 LN on 02/26/21, chemotherapy and radiation completed   PRECAUTIONS: Rt shoulder RTC, lymphedema Lt risk, TIA  SUBJECTIVE: I am just so sore all over.  I stopped the Letrozole 1 week ago and it  felt better and then on Saturday I had to take medication for pain and was crying.  I feel like this Rt plantar fasciitis is making things so horrible.    PAIN:  Are you having pain? Yes NPRS scale: 3/10 Pain location: left axilla and Rt foot Pain orientation: Right  PAIN TYPE: aching Pain description: constant and dull  Aggravating factors: reaching Relieving factors: rest  TODAY'S TREATMENT  Discussed current treatments for Rt plantar fasciitis  including stretches, night sock, inserts, dry needling, etc.,    Noted tension in sciatic nerve bil Rt>Lt so gave pt HEP per below for new hamstring additions - also discussed aquatics which I will add to the POC today.    MT PROM Lt shoulder with MFR to the Lt axilla, and upper arm following cording and length of pull  L-DEX Performed SOZO as pt is now more than 3 months post-op See flow sheet for today but change in baseline is 5 Pt was educated on what this means and scheduled for another recheck in 3 months    PATIENT EDUCATION:  Education details: cording, sciatic stretches, aquatics Person educated: Patient Education method: Customer service manager Education comprehension: verbalized understanding     HOME EXERCISE PROGRAM: Abd wall slides, counter lat stretch, stargazer in supine on left Access Code: [email or web address]    ASSESSMENT:   CLINICAL IMPRESSION: First session of MT today including MFR to the Lt axilla and upper arm with PROM limited end range due to discomofort in first the cording and then in the pectoralis after cording softened a bit.  Repeated SOZO today as surgery and treatment changes made her fall off of the normal schedule.  Added aquatics to POC today with pt interested in this due to trouble with Rt foot pain.     REHAB POTENTIAL: Good   CLINICAL DECISION MAKING: Stable/uncomplicated   EVALUATION COMPLEXITY: Low     GOALS: Goals reviewed with patient? Yes   SHORT TERM GOALS:   STG Name Target Date Goal status  1 Pt will be independent and compliant with HEP Baseline:  10/02/2021 INITIAL  LONG TERM GOALS:    LTG Name Target Date Goal status  1 Pt will have decreased left axillary/breast pain by 50% or greater Baseline: 10/23/2021 INITIAL  2 Pt will have improved left shoulder abduction to 160 degrees  Baseline:    10/23/2021 INITIAL  3 Pt will no longer complain of tightness with cording at end ranges Baseline: 10/23/2021 INITIAL  4 Pt will  attend ABC class Baseline: 10/23/2021 INITIAL    PLAN: PT FREQUENCY: 2x/week   PT DURATION: 6 weeks   PLANNED INTERVENTIONS: Therapeutic exercises, Neuro Muscular re-education, Patient/Family education, Orthotic/Fit training, Dry Needling, scar mobilization, and Manual therapy, aquatic therapy   PLAN FOR NEXT SESSION: want to schedule aquatics now? STM to left greater than right upper quarter, MFR to axillary cording, supine wand exs, PROM    Access Code: [email or web address]: [email or web address]: 02/13/2023Prepared by: Marcene Brawn TevisExercises  Supine Lower Trunk Rotation - 1 x daily - 7 x weekly - 1 sets - 10 reps - 3 second hold  Supine Hamstring Stretch with Strap - 1 x daily - 7 x weekly - 1-3 sets - 3 reps - 20-30 seconds hold  Supine Sciatic Nerve Glide - 1 x daily - 7 x weekly - 1 sets - 10 reps - 3 second hold     Stark Bray, PT 09/15/2021, 12:04 PM

## 2021-09-16 ENCOUNTER — Ambulatory Visit (HOSPITAL_COMMUNITY)
Admission: RE | Admit: 2021-09-16 | Discharge: 2021-09-16 | Disposition: A | Discharge status: Home / Self Care | Payer: BC Managed Care – PPO | Source: Ambulatory Visit | Attending: Adult Health | Admitting: Adult Health

## 2021-09-16 ENCOUNTER — Ambulatory Visit: Payer: BC Managed Care – PPO

## 2021-09-16 DIAGNOSIS — Z170 Estrogen receptor positive status [ER+]: Secondary | ICD-10-CM | POA: Diagnosis not present

## 2021-09-16 DIAGNOSIS — M79671 Pain in right foot: Secondary | ICD-10-CM | POA: Diagnosis not present

## 2021-09-16 DIAGNOSIS — C50919 Malignant neoplasm of unspecified site of unspecified female breast: Secondary | ICD-10-CM | POA: Diagnosis not present

## 2021-09-16 DIAGNOSIS — K5900 Constipation, unspecified: Secondary | ICD-10-CM | POA: Diagnosis not present

## 2021-09-16 DIAGNOSIS — C50412 Malignant neoplasm of upper-outer quadrant of left female breast: Secondary | ICD-10-CM

## 2021-09-16 DIAGNOSIS — R600 Localized edema: Secondary | ICD-10-CM

## 2021-09-16 DIAGNOSIS — K5730 Diverticulosis of large intestine without perforation or abscess without bleeding: Secondary | ICD-10-CM | POA: Diagnosis not present

## 2021-09-16 DIAGNOSIS — R293 Abnormal posture: Secondary | ICD-10-CM

## 2021-09-16 DIAGNOSIS — R918 Other nonspecific abnormal finding of lung field: Secondary | ICD-10-CM | POA: Diagnosis not present

## 2021-09-16 IMAGING — CT CT ABD-PELV W/ CM
3 of 8 series · 15 of 36 positions shown, 17 images · IV contrast (APPLIED)
Comparison: None.

CLINICAL DATA: Invasive breast cancer.

EXAM:
CT CHEST, ABDOMEN, AND PELVIS WITH CONTRAST
TECHNIQUE: Multidetector CT imaging of the chest, abdomen and pelvis was
performed following the standard protocol during bolus
administration of intravenous contrast.

[Series 2: cap with · axial · 0.96mm/px · z∈[-411,+29]mm · 7 of 118 slices shown, 9 images]
[im 15/118  mediastinal]
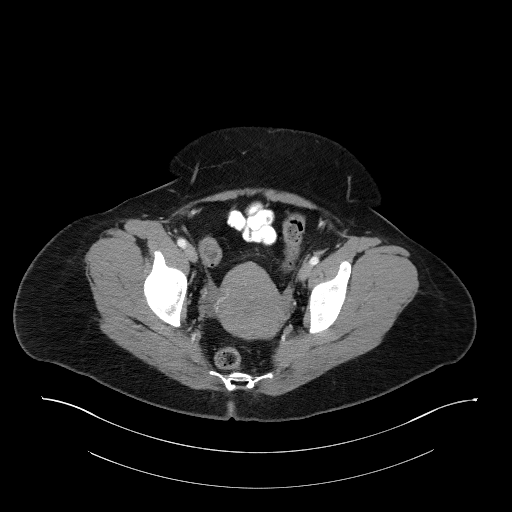
[im 15/118  lung]
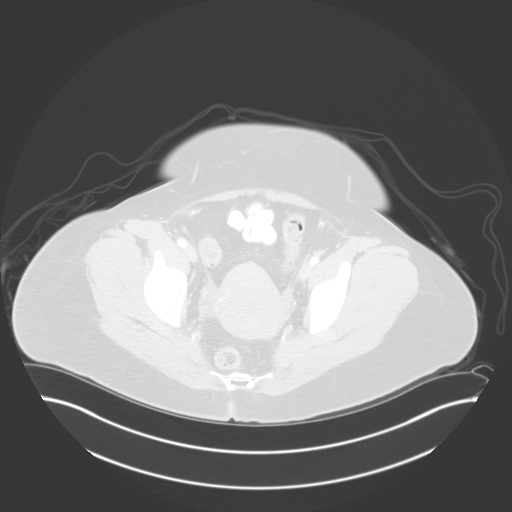
[im 30/118  lung]
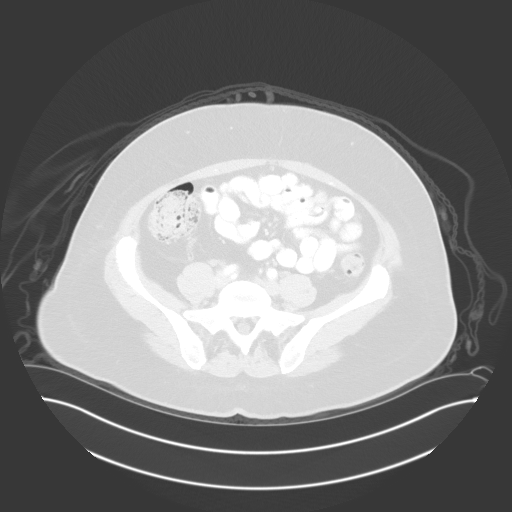
[im 44/118  lung]
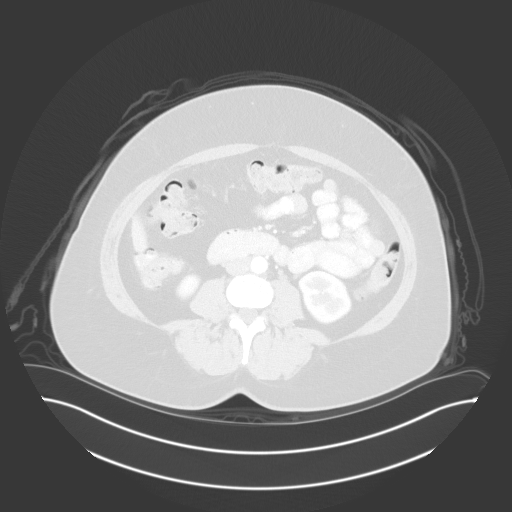
[im 59/118  lung]
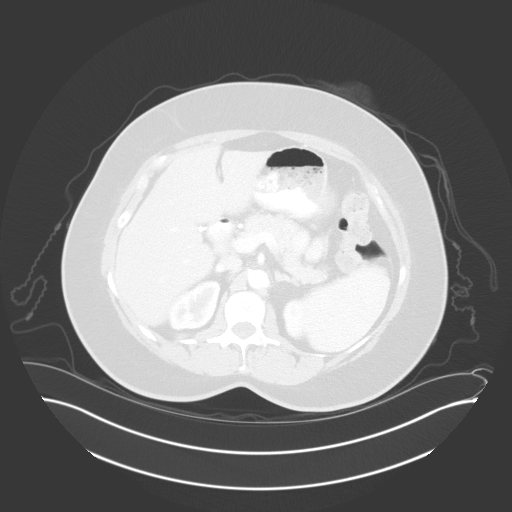
[im 74/118  mediastinal]
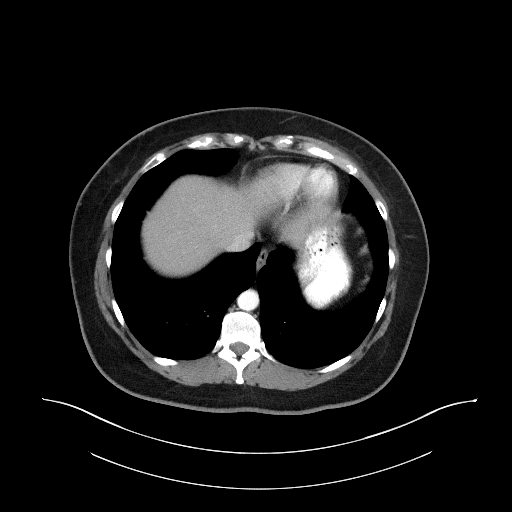
[im 74/118  lung]
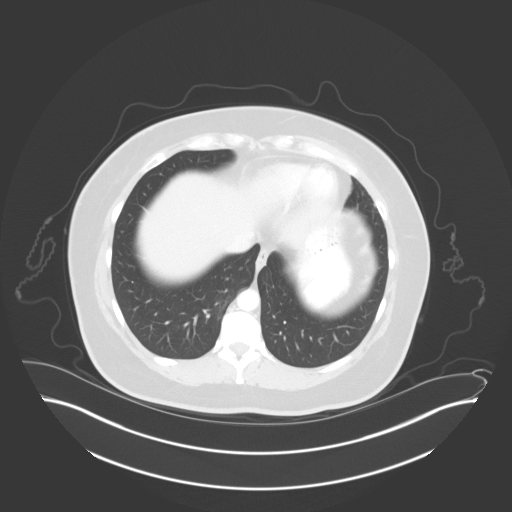
[im 88/118  lung]
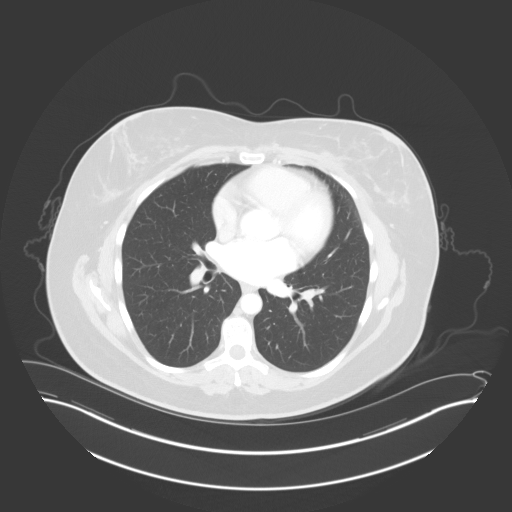
[im 103/118  lung]
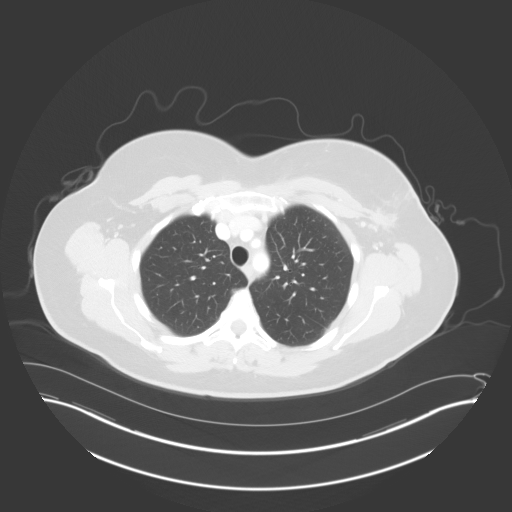

[Series 7: coronals · coronal · 0.93mm/px · 1 of 173 slices shown]
[im 87/173  lung]
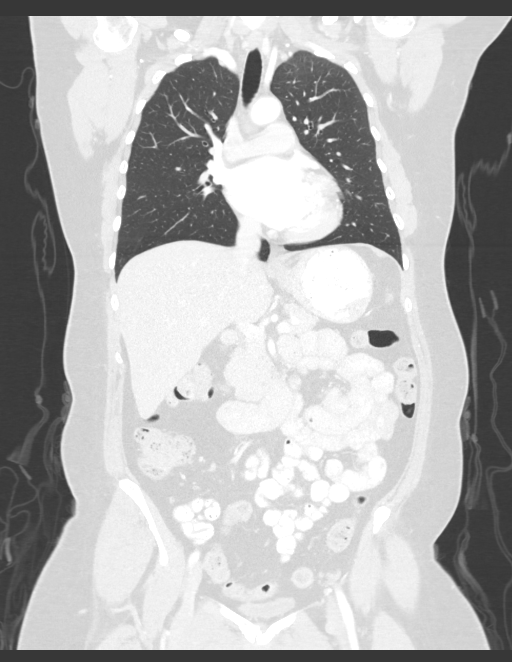

[Series 9: lung · axial · 0.96mm/px · z∈[-176,+28]mm · 7 of 153 slices shown]
[im 13/153  lung]
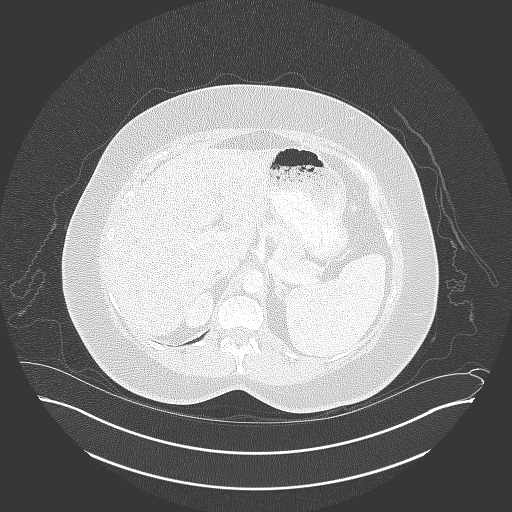
[im 39/153  lung]
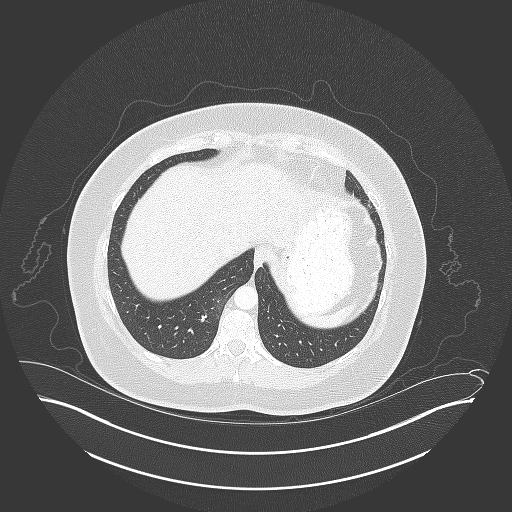
[im 51/153  lung]
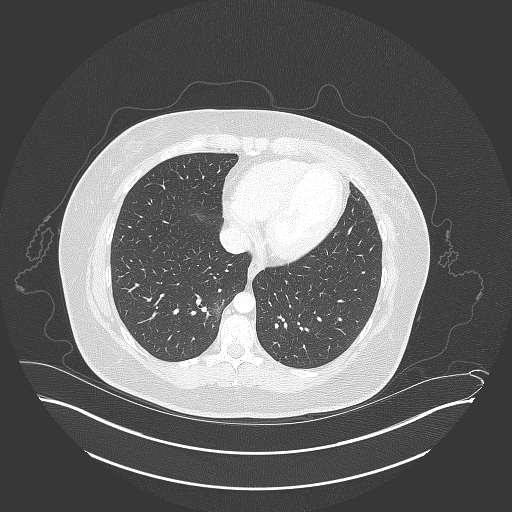
[im 64/153  lung]
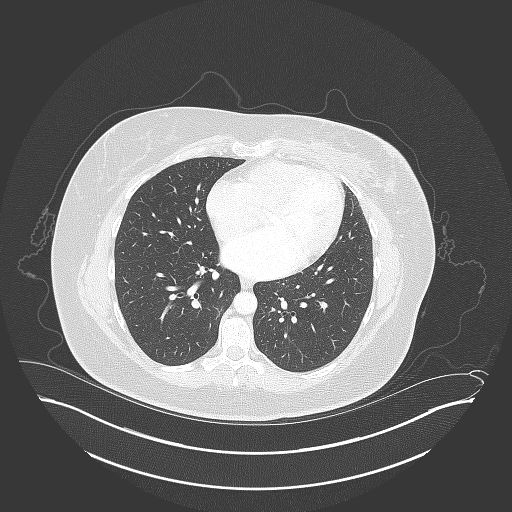
[im 89/153  lung]
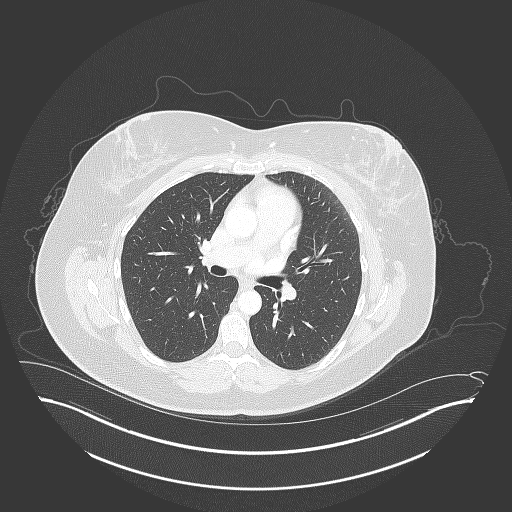
[im 102/153  lung]
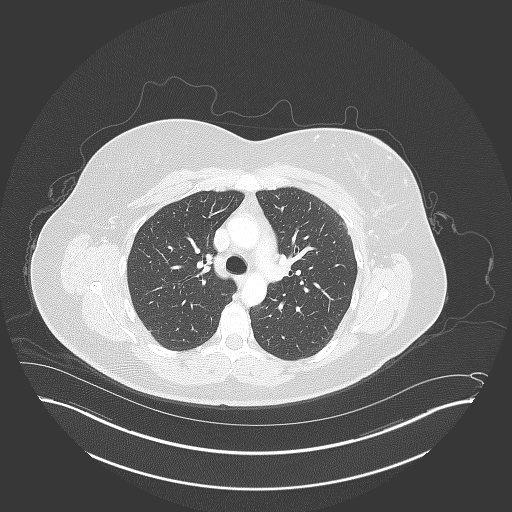
[im 115/153  lung]
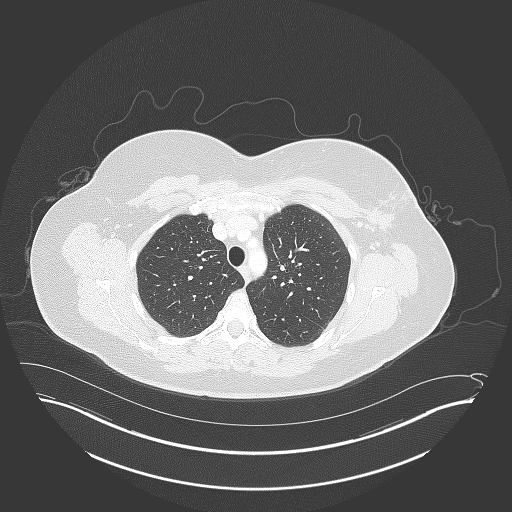

[15 of 36 positions shown; findings below may reference images not displayed]

RADIATION DOSE REDUCTION: This exam was performed according to the
departmental dose-optimization program which includes automated
exposure control, adjustment of the mA and/or kV according to
patient size and/or use of iterative reconstruction technique.

CONTRAST:  100mL OMNIPAQUE IOHEXOL 300 MG/ML  SOLN
FINDINGS: CT CHEST FINDINGS

Cardiovascular: Heart size is normal. No pericardial effusion
identified. Main pulmonary artery is normal caliber. Thoracic aorta
is normal in course and caliber.

Mediastinum/Nodes: No mediastinal or hilar lymphadenopathy
identified. No definite axillary lymphadenopathy visualized. There
is an irregular density in the left axilla measuring approximately
1.4 x 2.2 x 2.3 cm which could represent a mass, lymph node or
secondary to surgical procedure. There are adjacent surgical clips
in the left axilla.

Lungs/Pleura: A few ground-glass/subsolid pulmonary nodular
densities identified in the left upper lobe mostly near the apex.
Largest ground-glass density measures 2.1 x 1.6 cm axial image 25.
Other smaller adjacent nodules measure up to 5 mm. No pleural
effusion or pneumothorax identified.

Musculoskeletal: Fluid collection in the anterior left chest wall
deep breast tissue region measuring approximately 1.5 x 7.3 x 4.7 cm
with a thin rim of mild enhancement. Mild skin thickening of the
left breast. No suspicious bony lesions visualized.

CT ABDOMEN PELVIS FINDINGS

Hepatobiliary: No focal liver abnormality is seen. Status post
cholecystectomy. No biliary dilatation.

Pancreas: Unremarkable. No pancreatic ductal dilatation or
surrounding inflammatory changes.

Spleen: Normal in size without focal abnormality.

Adrenals/Urinary Tract: Adrenal glands are unremarkable. Kidneys are
normal, without renal calculi, focal lesion, or hydronephrosis.
Bladder is unremarkable.

Stomach/Bowel: No bowel obstruction, free air or pneumatosis. Mild
colonic diverticulosis. No bowel wall edema identified. Moderate
amount of retained fecal material throughout the colon. Appendix is
normal.

Vascular/Lymphatic: No significant vascular findings are present. No
enlarged abdominal or pelvic lymph nodes.

Reproductive: Uterus is mildly lobulated and diffusely
heterogeneous, likely representing fibroids, including a few
calcified fibroids. No suspicious adnexal mass visualized.

Other: No ascites.

Musculoskeletal: No suspicious bony lesions identified.
IMPRESSION: 1. Collection in the left chest wall/breast which could be secondary
to recent surgical procedure. Correlate with history and clinically
including for signs of infection.
2. Irregular density in the left axillary/lateral breast region.
Also possibly related to recent surgical procedure. Adjacent
surgical clips in the left axilla. Continued follow-up recommended.
3. Several ground-glass/subsolid pulmonary nodular densities in the
left upper lobe, largest near the apex. Metastases not excluded,
continued follow-up recommended.
4. No other suspicious mass or lymphadenopathy identified in the
chest, abdomen or pelvis.
5. Other ancillary findings as described.

## 2021-09-16 IMAGING — CT CT CHEST W/ CM
3 of 8 series · 15 of 36 positions shown, 17 images · IV contrast (APPLIED)
Comparison: None.

CLINICAL DATA: Invasive breast cancer.

EXAM:
CT CHEST, ABDOMEN, AND PELVIS WITH CONTRAST
TECHNIQUE: Multidetector CT imaging of the chest, abdomen and pelvis was
performed following the standard protocol during bolus
administration of intravenous contrast.

[Series 2: cap with · axial · 0.96mm/px · z∈[-411,+29]mm · 7 of 118 slices shown, 9 images]
[im 15/118  mediastinal]
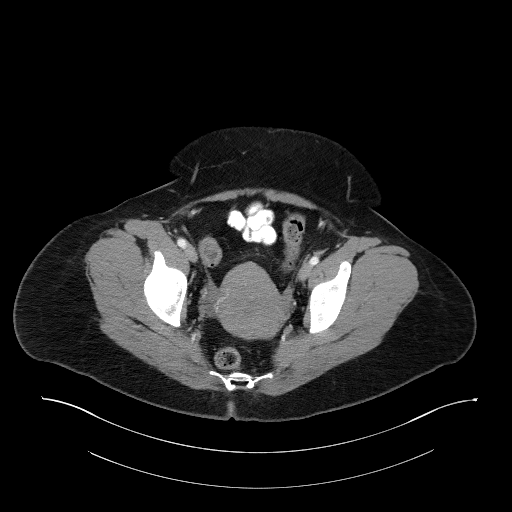
[im 15/118  lung]
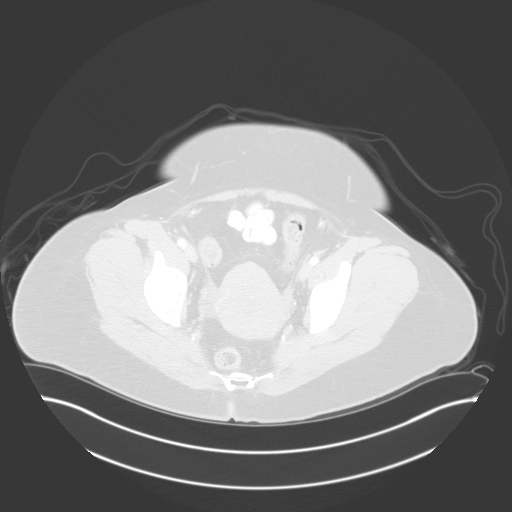
[im 30/118  lung]
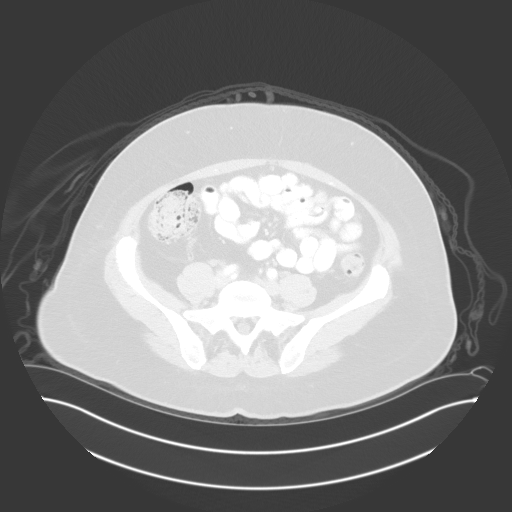
[im 44/118  lung]
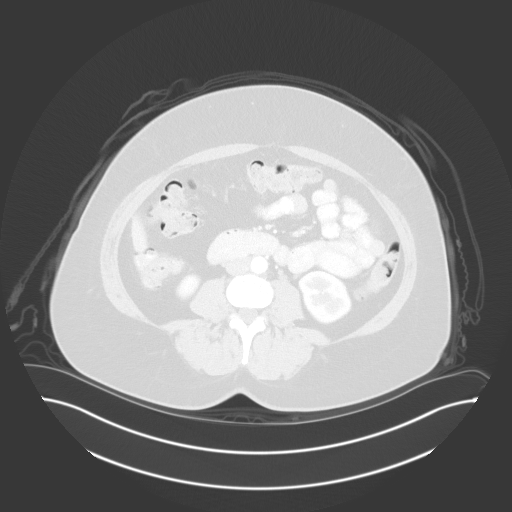
[im 59/118  lung]
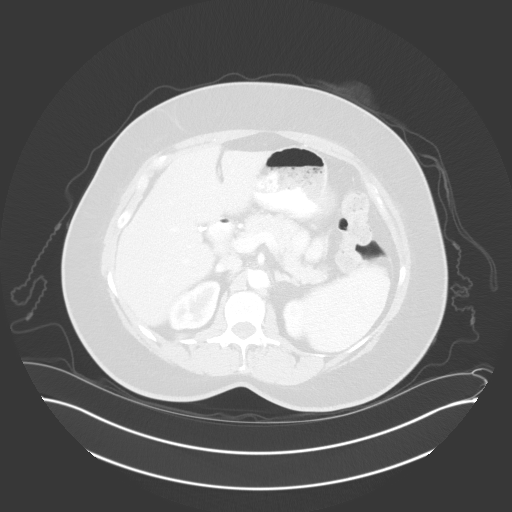
[im 74/118  mediastinal]
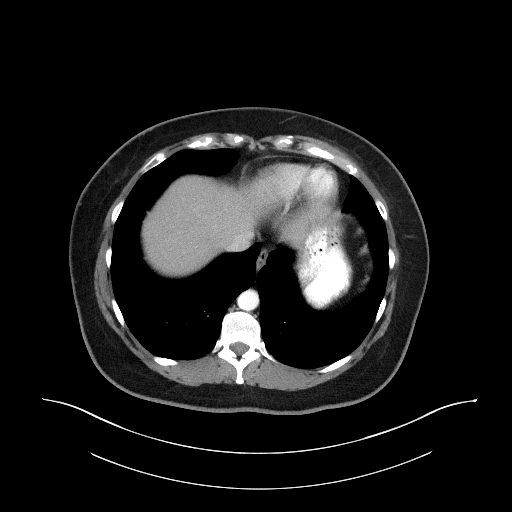
[im 74/118  lung]
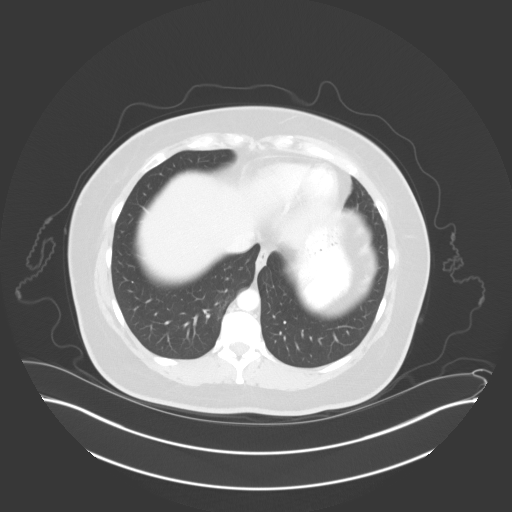
[im 88/118  lung]
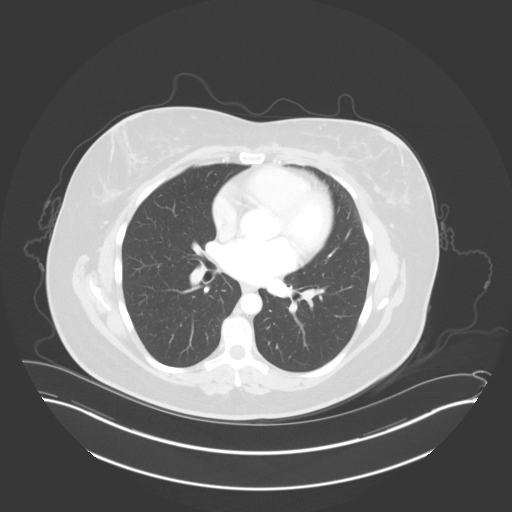
[im 103/118  lung]
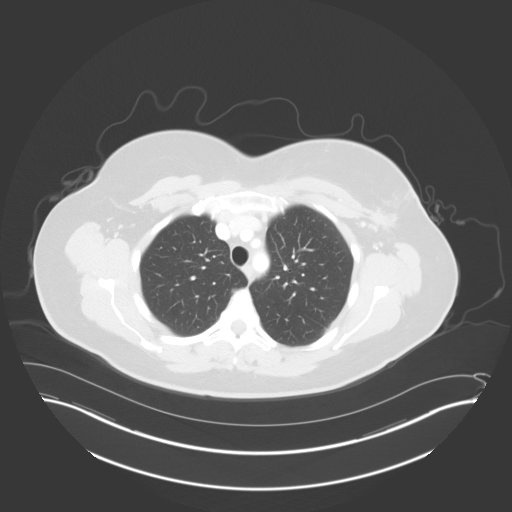

[Series 7: coronals · coronal · 0.93mm/px · 1 of 173 slices shown]
[im 87/173  lung]
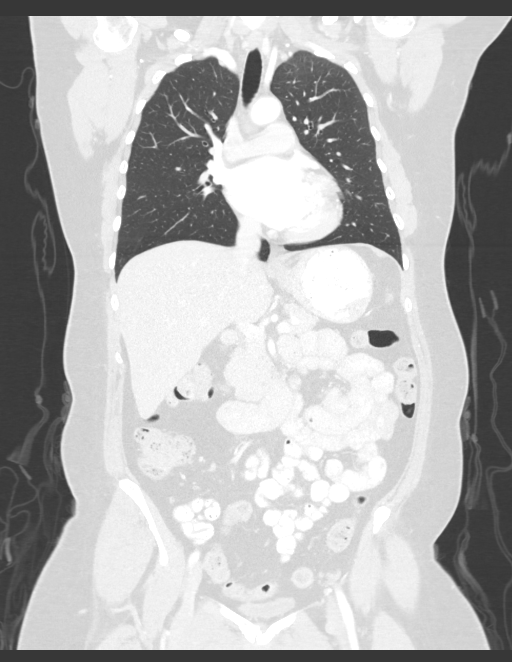

[Series 9: lung · axial · 0.96mm/px · z∈[-176,+28]mm · 7 of 153 slices shown]
[im 13/153  lung]
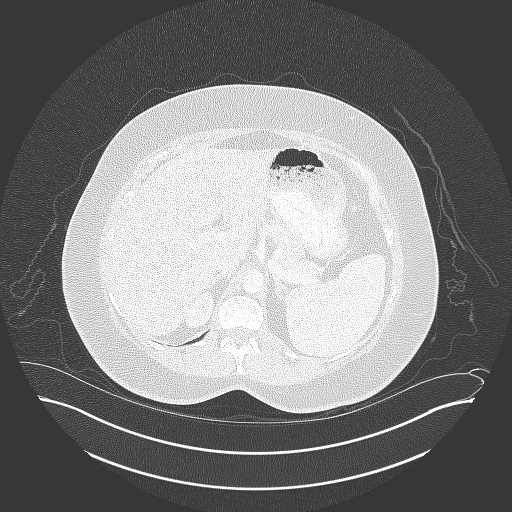
[im 39/153  lung]
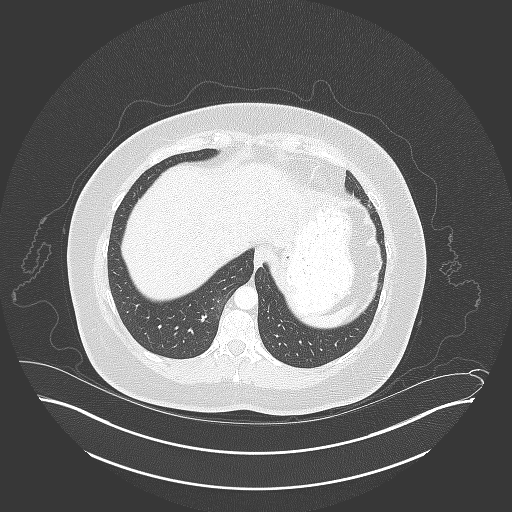
[im 51/153  lung]
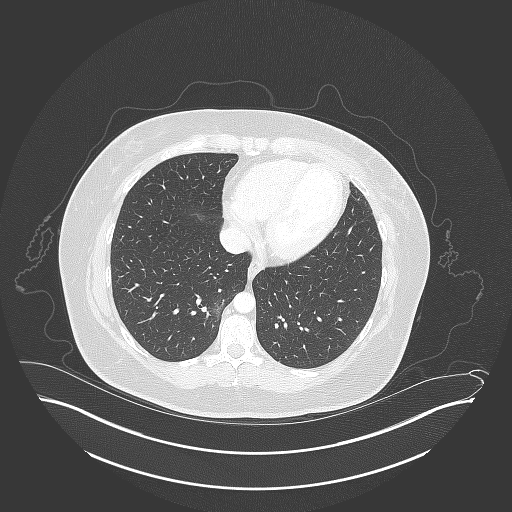
[im 64/153  lung]
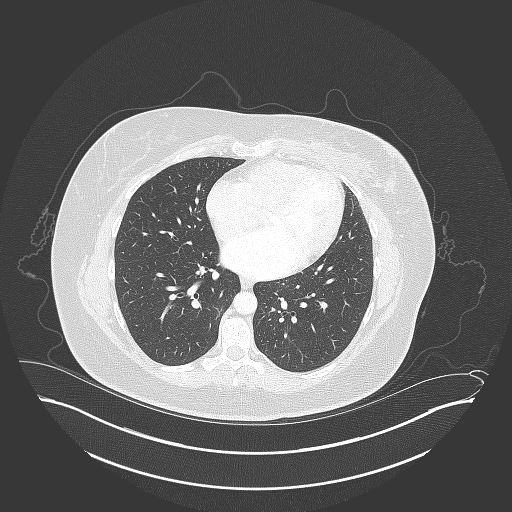
[im 89/153  lung]
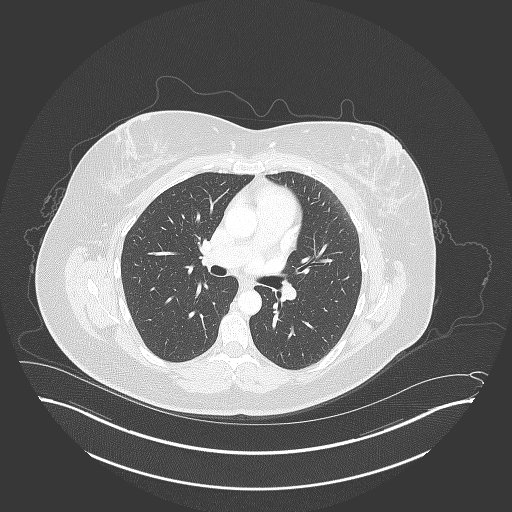
[im 102/153  lung]
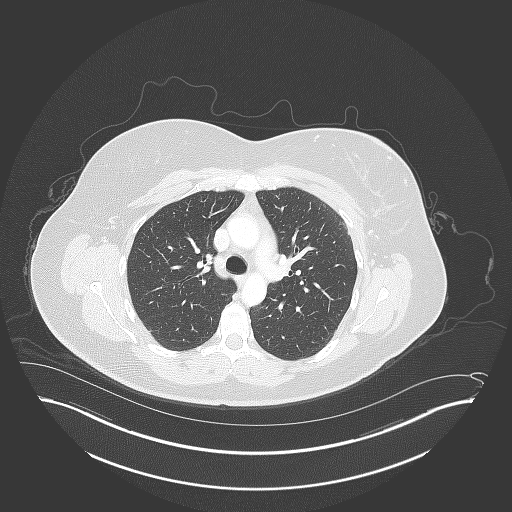
[im 115/153  lung]
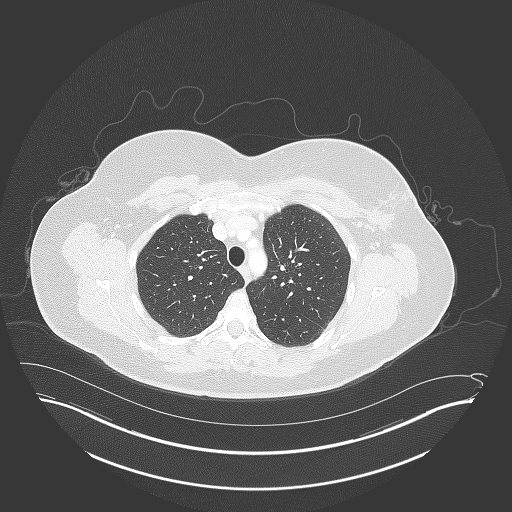

[15 of 36 positions shown; findings below may reference images not displayed]

RADIATION DOSE REDUCTION: This exam was performed according to the
departmental dose-optimization program which includes automated
exposure control, adjustment of the mA and/or kV according to
patient size and/or use of iterative reconstruction technique.

CONTRAST:  100mL OMNIPAQUE IOHEXOL 300 MG/ML  SOLN
FINDINGS: CT CHEST FINDINGS

Cardiovascular: Heart size is normal. No pericardial effusion
identified. Main pulmonary artery is normal caliber. Thoracic aorta
is normal in course and caliber.

Mediastinum/Nodes: No mediastinal or hilar lymphadenopathy
identified. No definite axillary lymphadenopathy visualized. There
is an irregular density in the left axilla measuring approximately
1.4 x 2.2 x 2.3 cm which could represent a mass, lymph node or
secondary to surgical procedure. There are adjacent surgical clips
in the left axilla.

Lungs/Pleura: A few ground-glass/subsolid pulmonary nodular
densities identified in the left upper lobe mostly near the apex.
Largest ground-glass density measures 2.1 x 1.6 cm axial image 25.
Other smaller adjacent nodules measure up to 5 mm. No pleural
effusion or pneumothorax identified.

Musculoskeletal: Fluid collection in the anterior left chest wall
deep breast tissue region measuring approximately 1.5 x 7.3 x 4.7 cm
with a thin rim of mild enhancement. Mild skin thickening of the
left breast. No suspicious bony lesions visualized.

CT ABDOMEN PELVIS FINDINGS

Hepatobiliary: No focal liver abnormality is seen. Status post
cholecystectomy. No biliary dilatation.

Pancreas: Unremarkable. No pancreatic ductal dilatation or
surrounding inflammatory changes.

Spleen: Normal in size without focal abnormality.

Adrenals/Urinary Tract: Adrenal glands are unremarkable. Kidneys are
normal, without renal calculi, focal lesion, or hydronephrosis.
Bladder is unremarkable.

Stomach/Bowel: No bowel obstruction, free air or pneumatosis. Mild
colonic diverticulosis. No bowel wall edema identified. Moderate
amount of retained fecal material throughout the colon. Appendix is
normal.

Vascular/Lymphatic: No significant vascular findings are present. No
enlarged abdominal or pelvic lymph nodes.

Reproductive: Uterus is mildly lobulated and diffusely
heterogeneous, likely representing fibroids, including a few
calcified fibroids. No suspicious adnexal mass visualized.

Other: No ascites.

Musculoskeletal: No suspicious bony lesions identified.
IMPRESSION: 1. Collection in the left chest wall/breast which could be secondary
to recent surgical procedure. Correlate with history and clinically
including for signs of infection.
2. Irregular density in the left axillary/lateral breast region.
Also possibly related to recent surgical procedure. Adjacent
surgical clips in the left axilla. Continued follow-up recommended.
3. Several ground-glass/subsolid pulmonary nodular densities in the
left upper lobe, largest near the apex. Metastases not excluded,
continued follow-up recommended.
4. No other suspicious mass or lymphadenopathy identified in the
chest, abdomen or pelvis.
5. Other ancillary findings as described.

## 2021-09-16 MED ORDER — SODIUM CHLORIDE (PF) 0.9 % IJ SOLN
INTRAMUSCULAR | Status: AC
  Filled 2021-09-16: qty 50

## 2021-09-16 MED ORDER — IOHEXOL 300 MG/ML  SOLN
100.0000 mL | Freq: Once | INTRAMUSCULAR | Status: AC | PRN
  Administered 2021-09-16: 100 mL via INTRAVENOUS

## 2021-09-16 NOTE — Therapy (Signed)
OUTPATIENT PHYSICAL THERAPY TREATMENT NOTE   Patient Name: Tracy Dyer MRN: [id number] DOB:04/24/1971, 51 y.o., female Today's Date: 09/16/2021  PCP: [name], MD REFERRING PROVIDER: Wilber Bihari [place]*   PT End of Session - 09/16/21 0900     Visit Number 3    Number of Visits 12    Date for PT Re-Evaluation 10/23/21    PT Start Time 0808    PT Stop Time 0857    PT Time Calculation (min) 49 min    Activity Tolerance Patient tolerated treatment well    Behavior During Therapy [facility] for tasks assessed/performed              Past Medical History:  Diagnosis Date   Anxiety    Cancer ([place])    breast   Depression    Family history of breast cancer    Family history of lung cancer    Family history of ovarian cancer    GERD (gastroesophageal reflux disease)    diet controlled   History of radiation therapy    Left breast, left axilla- 05/27/21-07/10/21- Dr. [name]   Seasonal allergies    Past Surgical History:  Procedure Laterality Date   BREAST BIOPSY Left 2019   x 2 biopsy   BREAST EXCISIONAL BIOPSY Left    BREAST LUMPECTOMY WITH RADIOACTIVE SEED AND SENTINEL LYMPH NODE BIOPSY Left 02/26/2021   Procedure: LEFT BREAST LUMPECTOMY WITH RADIOACTIVE SEED x2; LEFT AXILLARY SENTINEL NODE BIOPSY;  Surgeon: [name], MD;  Location: [place];  Service: General;  Laterality: Left;   BREAST LUMPECTOMY WITH RADIOFREQUENCY TAG IDENTIFICATION Left 7/82/9562   Procedure: LEFT BREAST MRI WIRE GUIDED EXCISION;  Surgeon: [name], MD;  Location: [place];  Service: General;  Laterality: Left;   BREAST REDUCTION SURGERY Bilateral 04/18/2021   Procedure: MAMMARY REDUCTION  (BREAST) LEFT ONCOPLASTIC BREAST RECONSTRUCTION, RIGHT BREAST REDUCTION;  Surgeon: Irene Limbo, MD;  Location: [place];  Service: Plastics;  Laterality: Bilateral;   CHOLECYSTECTOMY  2008   PORT-A-CATH REMOVAL Right  04/18/2021   Procedure: REMOVAL PORT-A-CATH;  Surgeon: [name], MD;  Location: [place];  Service: General;  Laterality: Right;   PORTACATH PLACEMENT N/A 09/05/2020   Procedure: INSERTION PORT-A-CATH;  Surgeon: [name], MD;  Location: [place];  Service: General;  Laterality: N/A;  [place] EXTRACTION     Patient Active Problem List   Diagnosis Date Noted   Occipital neuralgia of right side 07/01/2021   Peripheral vertigo 07/01/2021   Port-A-Cath in place 09/20/2020   Genetic testing 09/04/2020   Family history of breast cancer    Family history of ovarian cancer    Family history of lung cancer    Malignant neoplasm of upper-outer quadrant of left breast in female, estrogen receptor positive ([place]) 08/21/2020   Anxiety 11/16/2013   Hypercoagulable state ([place]) 11/16/2013   TIA (transient ischemic attack) 11/15/2013   Chest discomfort 01/20/2012   Factor V Leiden ([place]) 01/20/2012   MVP (mitral valve prolapse) 01/20/2012   Acute bronchitis 12/23/2011    REFERRING DIAG: Left breast swelling and axillary tightness  THERAPY DIAG:  Localized edema  Malignant neoplasm of upper-outer quadrant of left breast in female, estrogen receptor positive ([place])  Abnormal posture  Adding:  Pain in Rt foot  PERTINENT HISTORY: Lt lumpectomy with SLNB 0/4 LN on 02/26/21, chemotherapy and radiation completed   PRECAUTIONS: Rt shoulder RTC, lymphedema Lt risk, TIA  SUBJECTIVE:  I felt better after last visit and then it tightened back up later that day. Its tight today in my chest and arm.  She reports compliance with exercises. I don't feel good in general.  Stiff all over today.  It makes me get depressed.   PAIN:  Are you having pain? Yes NPRS scale: 4/10, left shoulder,axilla Pain location: left axilla and Rt foot Pain orientation: Right  PAIN TYPE: aching Pain description: constant and dull  Aggravating factors: reaching Relieving factors:  rest  TODAY'S TREATMENT  09/16/2021 MT: Soft tissue mobilization to bilateral UT, left pectorals and lats, and in right SL left lats and scapular area and left posterior shoulder. MFR to left axillary region and upper arm during stretching PROM left shoulder flexion, scaption, Abd, ER Supine wand flexion and scaption x 5 ea    09/15/2021 Discussed current treatments for Rt plantar fasciitis including stretches, night sock, inserts, dry needling, etc.,    Noted tension in sciatic nerve bil Rt>Lt so gave pt HEP per below for new hamstring additions - also discussed aquatics which I will add to the POC today.    MT PROM Lt shoulder with MFR to the Lt axilla, and upper arm following cording and length of pull  L-DEX Performed SOZO as pt is now more than 3 months post-op See flow sheet for today but change in baseline is 5 Pt was educated on what this means and scheduled for another recheck in 3 months    PATIENT EDUCATION:  Education details: cording, sciatic stretches, aquatics Person educated: Patient Education method: Customer service manager Education comprehension: verbalized understanding     HOME EXERCISE PROGRAM: Abd wall slides, counter lat stretch, stargazer in supine on left Access Code: [email or web address]    ASSESSMENT:   CLINICAL IMPRESSION:Performed soft tissue mobilization, MFR techniques to areas of cording, PROM and had pt perform AA ROM exercises.  Good improvement noted in pts shoulder ROM by competion of treatment. Multiple shortened areas noted in pectoralis , lats and UT with tenderness. REHAB POTENTIAL: Good   CLINICAL DECISION MAKING: Stable/uncomplicated   EVALUATION COMPLEXITY: Low     GOALS: Goals reviewed with patient? Yes   SHORT TERM GOALS:   STG Name Target Date Goal status  1 Pt will be independent and compliant with HEP Baseline:  10/02/2021 INITIAL  LONG TERM GOALS:    LTG Name Target Date Goal status  1 Pt will have decreased left  axillary/breast pain by 50% or greater Baseline: 10/23/2021 INITIAL  2 Pt will have improved left shoulder abduction to 160 degrees  Baseline:    10/23/2021 INITIAL  3 Pt will no longer complain of tightness with cording at end ranges Baseline: 10/23/2021 INITIAL  4 Pt will attend ABC class Baseline: 10/23/2021 INITIAL    PLAN: PT FREQUENCY: 2x/week   PT DURATION: 6 weeks   PLANNED INTERVENTIONS: Therapeutic exercises, Neuro Muscular re-education, Patient/Family education, Orthotic/Fit training, Dry Needling, scar mobilization, and Manual therapy, aquatic therapy   PLAN FOR NEXT SESSION: want to schedule aquatics now? STM to left greater than right upper quarter, MFR to axillary cording, supine wand exs, PROM, chest stretch at wall    Access Code: [email or web address]: [email or web address]: 02/13/2023Prepared by: Marcene Brawn TevisExercises  Supine Lower Trunk Rotation - 1 x daily - 7 x weekly - 1 sets - 10 reps - 3 second hold  Supine Hamstring Stretch with Strap - 1 x daily - 7 x weekly - 1-3 sets - 3 reps - 20-30 seconds hold  Supine  Sciatic Nerve Glide - 1 x daily - 7 x weekly - 1 sets - 10 reps - 3 second hold     Claris Pong, PT 09/16/2021, 9:02 AM

## 2021-09-17 ENCOUNTER — Encounter: Payer: Self-pay | Admitting: Hematology and Oncology

## 2021-09-18 ENCOUNTER — Inpatient Hospital Stay: Payer: BC Managed Care – PPO | Admitting: Adult Health

## 2021-09-18 ENCOUNTER — Other Ambulatory Visit: Payer: Self-pay

## 2021-09-18 VITALS — BP 115/76 | HR 67 | Temp 98.1°F | Resp 18 | Ht 66.0 in | Wt 202.1 lb

## 2021-09-18 DIAGNOSIS — C50412 Malignant neoplasm of upper-outer quadrant of left female breast: Secondary | ICD-10-CM | POA: Diagnosis not present

## 2021-09-18 DIAGNOSIS — Z8041 Family history of malignant neoplasm of ovary: Secondary | ICD-10-CM | POA: Diagnosis not present

## 2021-09-18 DIAGNOSIS — Z170 Estrogen receptor positive status [ER+]: Secondary | ICD-10-CM | POA: Diagnosis not present

## 2021-09-18 DIAGNOSIS — Z808 Family history of malignant neoplasm of other organs or systems: Secondary | ICD-10-CM | POA: Diagnosis not present

## 2021-09-18 DIAGNOSIS — I890 Lymphedema, not elsewhere classified: Secondary | ICD-10-CM | POA: Diagnosis not present

## 2021-09-18 DIAGNOSIS — Z803 Family history of malignant neoplasm of breast: Secondary | ICD-10-CM | POA: Diagnosis not present

## 2021-09-18 DIAGNOSIS — Z801 Family history of malignant neoplasm of trachea, bronchus and lung: Secondary | ICD-10-CM | POA: Diagnosis not present

## 2021-09-18 NOTE — Progress Notes (Addendum)
[facility] Follow up:    Tracy Pepper, MD [address] [place] [address]   DIAGNOSIS:  Cancer Staging  Malignant neoplasm of upper-outer quadrant of left breast in female, estrogen receptor positive ([place]) Staging form: Breast, AJCC 8th Edition - Clinical stage from 08/21/2020: Stage IIA (cT1c, cN1, cM0, G3, ER+, PR+, HER2-) - Signed by [name], MD on 08/21/2020   SUMMARY OF ONCOLOGIC HISTORY: Oncology History  Malignant neoplasm of upper-outer quadrant of left breast in female, estrogen receptor positive ([place])  08/21/2020 Initial Diagnosis   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 No pathogenic variants identified on the [facility] Breast Cancer STAT Panel + Multi-Cancer Panel. VUS in [facility] called c.983C>T identified. The report date is 09/04/2020.   The STAT Breast cancer panel offered by [facility] includes sequencing and rearrangement analysis for the following 9 genes:  ATM, BRCA1, BRCA2, CDH1, CHEK2, PALB2, PTEN, STK11 and TP53.    The Multi-Cancer Panel offered by [facility] includes sequencing and/or deletion duplication testing of the following 85 genes: AIP, ALK, APC, ATM, AXIN2,BAP1,  BARD1, BLM, BMPR1A, BRCA1, BRCA2, BRIP1, CASR, CDC73, CDH1, CDK4, CDKN1B, CDKN1C, CDKN2A (p14ARF), CDKN2A (p16INK4a), CEBPA, CHEK2, CTNNA1, DICER1, DIS3L2, EGFR (c.2369C>T,  p.Thr790Met variant only), EPCAM (Deletion/duplication testing only), FH, FLCN, GATA2, GPC3, GREM1 (Promoter region deletion/duplication testing only), HOXB13 (c.251G>A, p.Gly84Glu), HRAS, KIT, MAX, MEN1, MET, MITF (c.952G>A, p.Glu318Lys variant only), MLH1, MSH2, MSH3, MSH6, MUTYH, NBN, NF1, NF2, NTHL1, PALB2, PDGFRA, PHOX2B, PMS2, POLD1, POLE, POT1, PRKAR1A, PTCH1, PTEN, RAD50, RAD51C, RAD51D, RB1, RECQL4, RET, RNF43, RUNX1, SDHAF2, SDHA (sequence changes only), SDHB, SDHC, SDHD, SMAD4, SMARCA4, SMARCB1, SMARCE1, STK11, SUFU, TERC, TERT, TMEM127, TP53, TSC1, TSC2, VHL, WRN and WT1.    09/06/2020 - 01/17/2021 Chemotherapy   AC X 4 foll by Taxol       02/26/2021 Surgery   Left lumpectomy: no residual carcinoma with margins and axillary lymph nodes also negative for carcinoma       CURRENT THERAPY: Letrozole  INTERVAL HISTORY: Tracy Dyer 51 y.o. female is here today for follow-up of her recent scans accompanied by her husband.  She had had some swelling in her arm and we got a CT chest.  There was a 2.1 cm semisolid indeterminate lung nodule.  There were also some concerns of swelling in her breast.  She is very concerned about the scans and what they mean for her overall prognosis.  Patient Active Problem List   Diagnosis Date Noted   Occipital neuralgia of right side 07/01/2021   Peripheral vertigo 07/01/2021   Port-A-Cath in place 09/20/2020   Genetic testing 09/04/2020   Family history of breast cancer    Family history of ovarian cancer    Family history of lung cancer    Malignant neoplasm of upper-outer quadrant of left breast in female, estrogen receptor positive ([place]) 08/21/2020   Anxiety 11/16/2013   Hypercoagulable state ([place])  11/16/2013   TIA (transient ischemic attack) 11/15/2013   Chest discomfort 01/20/2012   Factor V Leiden ([place]) 01/20/2012   MVP (mitral valve prolapse) 01/20/2012   Acute bronchitis 12/23/2011    is allergic to sulfa antibiotics and  sulfamethoxazole.  MEDICAL HISTORY: Past Medical History:  Diagnosis Date   Anxiety    Cancer ([place])    breast   Depression    Family history of breast cancer    Family history of lung cancer    Family history of ovarian cancer    GERD (gastroesophageal reflux disease)    diet controlled   History of radiation therapy    Left breast, left axilla- 05/27/21-07/10/21- Dr. [name]   Seasonal allergies     SURGICAL HISTORY: Past Surgical History:  Procedure Laterality Date   BREAST BIOPSY Left 2019   x 2 biopsy   BREAST EXCISIONAL BIOPSY Left    BREAST LUMPECTOMY WITH RADIOACTIVE SEED AND SENTINEL LYMPH NODE BIOPSY Left 02/26/2021   Procedure: LEFT BREAST LUMPECTOMY WITH RADIOACTIVE SEED x2; LEFT AXILLARY SENTINEL NODE BIOPSY;  Surgeon: [name], MD;  Location: [facility];  Service: General;  Laterality: Left;   BREAST LUMPECTOMY WITH RADIOFREQUENCY TAG IDENTIFICATION Left 03/30/33   Procedure: LEFT BREAST MRI WIRE GUIDED EXCISION;  Surgeon: [name], MD;  Location: [place];  Service: General;  Laterality: Left;   BREAST REDUCTION SURGERY Bilateral 04/18/2021   Procedure: MAMMARY REDUCTION  (BREAST) LEFT ONCOPLASTIC BREAST RECONSTRUCTION, RIGHT BREAST REDUCTION;  Surgeon: Irene Limbo, MD;  Location: [facility] [place];  Service: Plastics;  Laterality: Bilateral;   CHOLECYSTECTOMY  2008   PORT-A-CATH REMOVAL Right 04/18/2021   Procedure: REMOVAL PORT-A-CATH;  Surgeon: [name], MD;  Location: [place];  Service: General;  Laterality: Right;   PORTACATH PLACEMENT N/A 09/05/2020   Procedure: INSERTION PORT-A-CATH;  Surgeon: [name], MD;  Location: [place];  Service: General;  Laterality: N/A;  PEC BLOCK   WISDOM TOOTH EXTRACTION      SOCIAL HISTORY: Social History   Socioeconomic History   Marital status: Married    Spouse name: Not on file   Number of children: Not on file    Years of education: Not on file   Highest education level: Not on file  Occupational History   Not on file  Tobacco Use   Smoking status: Never   Smokeless tobacco: Never  Vaping Use   Vaping Use: Never used  Substance and Sexual Activity   Alcohol use: Not Currently   Drug use: Never   Sexual activity: Yes    Birth control/protection: None  Other Topics Concern   Not on file  Social History Narrative   Not on file   Social Determinants of Health   Financial Resource Strain: Not on file  Food Insecurity: Not on file  Transportation Needs: Not on file  Physical Activity: Not on file  Stress: Not on file  Social Connections: Not on file  Intimate Partner Violence: Not on file    FAMILY HISTORY: Family History  Problem Relation Age of Onset   Eczema Father    Allergic rhinitis Brother    Breast cancer Mother        in 46's   Cancer Maternal Uncle        unk type   Lung cancer Maternal Grandfather 42   Heart Problems Paternal Grandfather    Melanoma Cousin    Ovarian cancer Paternal Great-grandmother    Cancer Paternal  Great-grandmother        GYN cancer, possibly ovarian?   Cancer Maternal Aunt        half aunts/uncles x5- rare cancers    Review of Systems  Constitutional:  Positive for fatigue. Negative for appetite change, chills, fever and unexpected weight change.  HENT:   Negative for hearing loss, lump/mass and trouble swallowing.   Eyes:  Negative for eye problems and icterus.  Respiratory:  Negative for chest tightness, cough and shortness of breath.   Cardiovascular:  Negative for chest pain, leg swelling and palpitations.  Gastrointestinal:  Negative for abdominal distention, abdominal pain, constipation, diarrhea, nausea and vomiting.  Endocrine: Negative for hot flashes.  Genitourinary:  Negative for difficulty urinating.   Musculoskeletal:  Negative for arthralgias.  Skin:  Negative for itching and rash.  Neurological:  Negative for dizziness,  extremity weakness, headaches and numbness.  Hematological:  Negative for adenopathy. Does not bruise/bleed easily.  Psychiatric/Behavioral:  Negative for depression. The patient is nervous/anxious.      PHYSICAL EXAMINATION  ECOG PERFORMANCE STATUS: 1 - Symptomatic but completely ambulatory  Vitals:   09/18/21 1243  BP: 115/76  Pulse: 67  Resp: 18  Temp: 98.1 F (36.7 C)  SpO2: 100%    Physical Exam Constitutional:      General: She is not in acute distress.    Appearance: Normal appearance. She is not toxic-appearing.  HENT:     Head: Normocephalic and atraumatic.  Eyes:     General: No scleral icterus. Cardiovascular:     Rate and Rhythm: Normal rate and regular rhythm.     Pulses: Normal pulses.     Heart sounds: Normal heart sounds.  Pulmonary:     Effort: Pulmonary effort is normal.     Breath sounds: Normal breath sounds.  Chest:     Comments: Right breast with radiation changes.  On her exam it is not blatantly obvious if there is a fluid collection.  She has no left or right axillary adenopathy. Abdominal:     General: Abdomen is flat. Bowel sounds are normal. There is no distension.     Palpations: Abdomen is soft.     Tenderness: There is no abdominal tenderness.  Musculoskeletal:        General: No swelling.     Cervical back: Neck supple.  Lymphadenopathy:     Cervical: No cervical adenopathy.  Skin:    General: Skin is warm and dry.     Findings: No rash.  Neurological:     General: No focal deficit present.     Mental Status: She is alert.  Psychiatric:        Mood and Affect: Mood normal.        Behavior: Behavior normal.    LABORATORY DATA:  CBC    Component Value Date/Time   WBC 4.6 09/04/2021 1154   WBC 3.2 (L) 01/17/2021 1015   RBC 4.37 09/04/2021 1154   HGB 13.2 09/04/2021 1154   HCT 38.7 09/04/2021 1154   PLT 192 09/04/2021 1154   MCV 88.6 09/04/2021 1154   MCH 30.2 09/04/2021 1154   MCHC 34.1 09/04/2021 1154   RDW 14.3  09/04/2021 1154   LYMPHSABS 1.0 09/04/2021 1154   MONOABS 0.5 09/04/2021 1154   EOSABS 0.2 09/04/2021 1154   BASOSABS 0.1 09/04/2021 1154    CMP     Component Value Date/Time   NA 138 09/04/2021 1154   K 4.8 09/04/2021 1154   CL 107 09/04/2021 1154  CO2 28 09/04/2021 1154   GLUCOSE 90 09/04/2021 1154   BUN 15 09/04/2021 1154   CREATININE 0.84 09/04/2021 1154   CALCIUM 9.5 09/04/2021 1154   PROT 6.6 09/04/2021 1154   ALBUMIN 4.2 09/04/2021 1154   AST 25 09/04/2021 1154   ALT 25 09/04/2021 1154   ALKPHOS 82 09/04/2021 1154   BILITOT 0.4 09/04/2021 1154   GFRNONAA >60 09/04/2021 1154     ASSESSMENT and THERAPY PLAN:   Malignant neoplasm of upper-outer quadrant of left breast in female, estrogen receptor positive ([place]) 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  Neoadjuvant chemotherapy with dose dense Adriamycin and Cytoxan followed by Taxol weekly x12 completed 01/17/2021 2. 02/26/2021 breast conserving surgery with sentinel lymph node and targeted node dissection,: Path CR 0/4 LN Neg 04/18/21: Bilateral Mammoplasty: Benign 3.  Adjuvant radiation therapy 05/28/21- 07/10/21 4.  Followed by adjuvant antiestrogen therapy with tamoxifen--unable to tolerate, on Letrozole daily ------------------------------------------------------------------------------------------------------------------------- Tracy Dyer has been off the  letrozole daily.  We will change her to exemestane.  She met with myself and Dr. Lindi Adie today.  We reviewed her scans.  Her scans do not definitively indicate recurrence or metastasis.  She will undergo PET scan and we have sent Dr. Valeta Harms a message to get her in for consultation.  She will see Dr. Lindi Adie after her PET scan.  Total encounter time: 30 minutes in face to face visit time, chart review, lab review,care coordination, and documentation of the encounter.    Wilber Bihari, NP 09/24/21 7:38 AM Medical Oncology and Hematology [facility], [place] [address] Tel. [phone]    Fax. [phone]  *Total Encounter Time as defined by the Centers for Medicare and Medicaid Services includes, in addition to the face-to-face time of a patient visit (documented in the note above) non-face-to-face time: obtaining and reviewing outside history, ordering and reviewing medications, tests or procedures, care coordination (communications with other health care professionals or caregivers) and documentation in the medical record.  Attending Note  I personally saw and examined Tracy Dyer. The plan of care was discussed with her. I agree with the physical exam findings and assessment and plan as documented above. I performed the majority of the counseling and assessment and plan regarding this encounter I reviewed the CT scan results in great detail with the patient. We will obtain a PET CT scan and then referral to Dr. Valeta Harms for pulmonary consultation.  Signed Harriette Ohara, MD

## 2021-09-20 MED ORDER — EXEMESTANE 25 MG PO TABS
25.0000 mg | ORAL_TABLET | Freq: Every day | ORAL | 1 refills | Status: DC
Start: 2021-09-20 — End: 2021-10-13

## 2021-09-20 NOTE — Assessment & Plan Note (Signed)
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%, PR 95%, HER2 equivocal, FISH pending, Ki-67 20% T1CN1 stage IIa MammaPrint: High risk: Luminal type B, probability of path CR 6% with chemo and antiestrogen therapy predicted benefit of treatment at 5 years 94.6%, average 10-year risk of recurrence untreated: 29%  Treatment plan: 1.Neoadjuvant chemotherapy with dose dense Adriamycin and Cytoxan followed by Taxol weekly x12completed 01/17/2021 2.7/27/2022breast conserving surgery with sentinel lymph node and targeted node dissection,: Path CR 0/4 LN Neg 04/18/21: Bilateral Mammoplasty: Benign 3.Adjuvant radiation therapy 05/28/21- 07/10/21 4.Followed by adjuvant antiestrogen therapy with tamoxifen--unable to tolerate, on Letrozole daily ------------------------------------------------------------------------------------------------------------------------- Tracy Dyer has been off the letrozole daily.  We will change her to exemestane.  She met with myself and Dr. Lindi Adie today.  We reviewed her scans.  Her scans do not definitively indicate recurrence or metastasis.  She will undergo PET scan and we have sent Dr. Valeta Harms a message to get her in for consultation.  She will see Dr. Lindi Adie after her PET scan.

## 2021-09-21 ENCOUNTER — Encounter: Payer: Self-pay | Admitting: Adult Health

## 2021-09-22 ENCOUNTER — Ambulatory Visit: Payer: BC Managed Care – PPO | Admitting: Rehabilitation

## 2021-09-22 ENCOUNTER — Encounter: Payer: Self-pay | Admitting: Rehabilitation

## 2021-09-22 ENCOUNTER — Other Ambulatory Visit: Payer: Self-pay

## 2021-09-22 DIAGNOSIS — C50412 Malignant neoplasm of upper-outer quadrant of left female breast: Secondary | ICD-10-CM

## 2021-09-22 DIAGNOSIS — R600 Localized edema: Secondary | ICD-10-CM

## 2021-09-22 DIAGNOSIS — R293 Abnormal posture: Secondary | ICD-10-CM | POA: Diagnosis not present

## 2021-09-22 DIAGNOSIS — Z170 Estrogen receptor positive status [ER+]: Secondary | ICD-10-CM | POA: Diagnosis not present

## 2021-09-22 DIAGNOSIS — M79671 Pain in right foot: Secondary | ICD-10-CM | POA: Diagnosis not present

## 2021-09-22 NOTE — Therapy (Signed)
OUTPATIENT PHYSICAL THERAPY TREATMENT NOTE   Patient Name: Tracy Dyer MRN: [id number] DOB:1970/12/30, 51 y.o., female Today's Date: 09/22/2021  PCP: [name], MD REFERRING PROVIDER: Wilber Bihari [place]*      Past Medical History:  Diagnosis Date   Anxiety    Cancer [facility])    breast   Depression    Family history of breast cancer    Family history of lung cancer    Family history of ovarian cancer    GERD (gastroesophageal reflux disease)    diet controlled   History of radiation therapy    Left breast, left axilla- 05/27/21-07/10/21- Dr. [name]   Seasonal allergies    Past Surgical History:  Procedure Laterality Date   BREAST BIOPSY Left 2019   x 2 biopsy   BREAST EXCISIONAL BIOPSY Left    BREAST LUMPECTOMY WITH RADIOACTIVE SEED AND SENTINEL LYMPH NODE BIOPSY Left 02/26/2021   Procedure: LEFT BREAST LUMPECTOMY WITH RADIOACTIVE SEED x2; LEFT AXILLARY SENTINEL NODE BIOPSY;  Surgeon: [name], MD;  Location: [employer] [facility];  Service: General;  Laterality: Left;   BREAST LUMPECTOMY WITH RADIOFREQUENCY TAG IDENTIFICATION Left 10/22/252   Procedure: LEFT BREAST MRI WIRE GUIDED EXCISION;  Surgeon: [name], MD;  Location: [place];  Service: General;  Laterality: Left;   BREAST REDUCTION SURGERY Bilateral 04/18/2021   Procedure: MAMMARY REDUCTION  (BREAST) LEFT ONCOPLASTIC BREAST RECONSTRUCTION, RIGHT BREAST REDUCTION;  Surgeon: Irene Limbo, MD;  Location: [place];  Service: Plastics;  Laterality: Bilateral;   CHOLECYSTECTOMY  2008   PORT-A-CATH REMOVAL Right 04/18/2021   Procedure: REMOVAL PORT-A-CATH;  Surgeon: [name], MD;  Location: [place];  Service: General;  Laterality: Right;   PORTACATH PLACEMENT N/A 09/05/2020   Procedure: INSERTION PORT-A-CATH;  Surgeon: [name], MD;  Location: [place];  Service: General;  Laterality: N/A;  [place] EXTRACTION     Patient Active Problem List   Diagnosis Date Noted   Occipital neuralgia of right side 07/01/2021   Peripheral vertigo 07/01/2021   Port-A-Cath in place 09/20/2020   Genetic testing 09/04/2020   Family history of breast cancer    Family history of ovarian cancer    Family history of lung cancer    Malignant neoplasm of upper-outer quadrant of left breast in female, estrogen receptor positive ([place]) 08/21/2020   Anxiety 11/16/2013   Hypercoagulable state ([place]) 11/16/2013   TIA (transient ischemic attack) 11/15/2013   Chest discomfort 01/20/2012   Factor V Leiden ([place]) 01/20/2012   MVP (mitral valve prolapse) 01/20/2012   Acute bronchitis 12/23/2011    REFERRING DIAG: Left breast swelling and axillary tightness  THERAPY DIAG:  Malignant neoplasm of upper-outer quadrant of left breast in female, estrogen receptor positive ([place])  Localized edema  Abnormal posture  Adding:  Pain in Rt foot  PERTINENT HISTORY: Lt lumpectomy with SLNB 0/4 LN on 02/26/21, chemotherapy and radiation completed   PRECAUTIONS: Rt shoulder RTC, lymphedema Lt risk, TIA  SUBJECTIVE: I felt better after last visit and then it tightened back up later that day. Its tight today in my chest and arm.  She reports compliance with exercises. I don't feel good in general.  Stiff all over today.  It makes me get depressed.   PAIN:  Are you having pain? Yes NPRS scale: 5/10 Pain location:  Rt foot Pain orientation: Right  PAIN TYPE: aching Pain description: constant and dull  Aggravating factors: reaching Relieving factors:  rest  TODAY'S TREATMENT    09/22/21  TE:   Pulleys 74min flexion and abduction with initial cueing giving pt info on self ordering Wall ball flexion 6" x 6 with cording pull noted  Wall ball abduction left 5" x 5 with initial performance cueing  Doorway stretch 2x20"   MT:  Soft tissue mobilization to Left UT, left pectorals and lats, and in right SL left  lats and scapular area and left posterior shoulder. MFR to left axillary region and upper arm during stretching with cording release PROM left shoulder flexion, scaption, Abd, ER   09/16/2021 MT: Soft tissue mobilization to bilateral UT, left pectorals and lats, and in right SL left lats and scapular area and left posterior shoulder. MFR to left axillary region and upper arm during stretching PROM left shoulder flexion, scaption, Abd, ER Supine wand flexion and scaption x 5 ea   09/15/2021 Discussed current treatments for Rt plantar fasciitis including stretches, night sock, inserts, dry needling, etc.,    Noted tension in sciatic nerve bil Rt>Lt so gave pt HEP per below for new hamstring additions - also discussed aquatics which I will add to the POC today.   MT PROM Lt shoulder with MFR to the Lt axilla, and upper arm following cording and length of pull L-DEX Performed SOZO as pt is now more than 3 months post-op See flow sheet for today but change in baseline is 5 Pt was educated on what this means and scheduled for another recheck in 3 months    PATIENT EDUCATION:  Education details: cording, sciatic stretches, aquatics Person educated: Patient Education method: Customer service manager Education comprehension: verbalized understanding     HOME EXERCISE PROGRAM: Abd wall slides, counter lat stretch, stargazer in supine on left Access Code: [email or web address]    ASSESSMENT:   CLINICAL IMPRESSION:Performed soft tissue mobilization, MFR techniques to areas of cording, PROM and had pt perform AA ROM exercises.  Good improvement noted in pts shoulder ROM by competion of treatment. Multiple shortened areas noted in pectoralis , lats and UT with tenderness. REHAB POTENTIAL: Good   CLINICAL DECISION MAKING: Stable/uncomplicated   EVALUATION COMPLEXITY: Low     GOALS: Goals reviewed with patient? Yes   SHORT TERM GOALS:   STG Name Target Date Goal status  1 Pt will be  independent and compliant with HEP Baseline:  10/02/2021 INITIAL  LONG TERM GOALS:    LTG Name Target Date Goal status  1 Pt will have decreased left axillary/breast pain by 50% or greater Baseline: 10/23/2021 INITIAL  2 Pt will have improved left shoulder abduction to 160 degrees  Baseline:    10/23/2021 INITIAL  3 Pt will no longer complain of tightness with cording at end ranges Baseline: 10/23/2021 INITIAL  4 Pt will attend ABC class Baseline: 10/23/2021 INITIAL    PLAN: PT FREQUENCY: 2x/week   PT DURATION: 6 weeks   PLANNED INTERVENTIONS: Therapeutic exercises, Neuro Muscular re-education, Patient/Family education, Orthotic/Fit training, Dry Needling, scar mobilization, and Manual therapy, aquatic therapy   PLAN FOR NEXT SESSION: want to schedule aquatics now? STM to left greater than right upper quarter, MFR to axillary cording, supine wand exs, PROM, chest stretch at wall    Access Code: [email or web address]: [email or web address]: 02/13/2023Prepared by: Marcene Brawn TevisExercises  Supine Lower Trunk Rotation - 1 x daily - 7 x weekly - 1 sets - 10 reps - 3 second hold  Supine Hamstring Stretch with Strap - 1 x daily - 7 x weekly - 1-3 sets -  3 reps - 20-30 seconds hold  Supine Sciatic Nerve Glide - 1 x daily - 7 x weekly - 1 sets - 10 reps - 3 second hold     Stark Bray, PT 09/22/2021, 8:06 AM

## 2021-09-23 ENCOUNTER — Encounter: Payer: Self-pay | Admitting: Adult Health

## 2021-09-24 ENCOUNTER — Other Ambulatory Visit: Payer: Self-pay | Admitting: Adult Health

## 2021-09-24 DIAGNOSIS — Z170 Estrogen receptor positive status [ER+]: Secondary | ICD-10-CM

## 2021-09-24 DIAGNOSIS — C50412 Malignant neoplasm of upper-outer quadrant of left female breast: Secondary | ICD-10-CM

## 2021-09-25 ENCOUNTER — Telehealth: Payer: Self-pay

## 2021-09-25 DIAGNOSIS — C50412 Malignant neoplasm of upper-outer quadrant of left female breast: Secondary | ICD-10-CM | POA: Diagnosis not present

## 2021-09-25 DIAGNOSIS — Z170 Estrogen receptor positive status [ER+]: Secondary | ICD-10-CM | POA: Diagnosis not present

## 2021-09-25 NOTE — Telephone Encounter (Signed)
Spoke with pt regarding [employer]. Pt was offered appt with Wilber Bihari, NP and accepted. Attempted to move pt's MM & [place] to sooner appt but was unsuccessful as all [facility] as well as [facility] are booked. Per last message with [place], LPN pt should also try to see Dr Donne Hazel. Called Dr Cristal Generous office and was able to get pt in for today at 1445. Pt's appt w/ NP will be cancelled. Pt is to have MM & [place] 2/28.

## 2021-09-25 NOTE — Telephone Encounter (Signed)
Called pt to check on her after her visit with Dr Donne Hazel. Pt states MD placed her on abx and was able to schedule Br [place] at [facility] for 2/24. Pt verbalizes thanks for the follow up call to check on her.

## 2021-09-26 ENCOUNTER — Inpatient Hospital Stay: Payer: BC Managed Care – PPO | Admitting: Adult Health

## 2021-09-26 ENCOUNTER — Encounter: Payer: Self-pay | Admitting: Rehabilitation

## 2021-09-26 ENCOUNTER — Ambulatory Visit: Payer: BC Managed Care – PPO | Admitting: Rehabilitation

## 2021-09-26 ENCOUNTER — Other Ambulatory Visit: Payer: Self-pay

## 2021-09-26 ENCOUNTER — Ambulatory Visit
Admission: RE | Admit: 2021-09-26 | Discharge: 2021-09-26 | Disposition: A | Discharge status: Home / Self Care | Payer: BC Managed Care – PPO | Source: Ambulatory Visit | Attending: Adult Health | Admitting: Adult Health

## 2021-09-26 DIAGNOSIS — M79671 Pain in right foot: Secondary | ICD-10-CM | POA: Diagnosis not present

## 2021-09-26 DIAGNOSIS — N6489 Other specified disorders of breast: Secondary | ICD-10-CM | POA: Diagnosis not present

## 2021-09-26 DIAGNOSIS — R600 Localized edema: Secondary | ICD-10-CM

## 2021-09-26 DIAGNOSIS — R293 Abnormal posture: Secondary | ICD-10-CM

## 2021-09-26 DIAGNOSIS — Z170 Estrogen receptor positive status [ER+]: Secondary | ICD-10-CM

## 2021-09-26 DIAGNOSIS — C50412 Malignant neoplasm of upper-outer quadrant of left female breast: Secondary | ICD-10-CM

## 2021-09-26 IMAGING — US US BREAST*L* LIMITED INC AXILLA
1 series · 12 of 12 positions shown · non-contrast
Comparison: Recent CT scan from [DATE]

CLINICAL DATA: History of breast cancer with lumpectomy and left
axillary lymph node dissection. A recent CT scan demonstrated a
possible mass in the left axilla versus scarring. A fluid collection
was also seen in the deep left breast.

EXAM:
ULTRASOUND OF THE LEFT BREAST

[Series 1: us breast*left* limited inc axilla · 0.06mm/px · 12 of 12 slices shown]
[im 1/12]
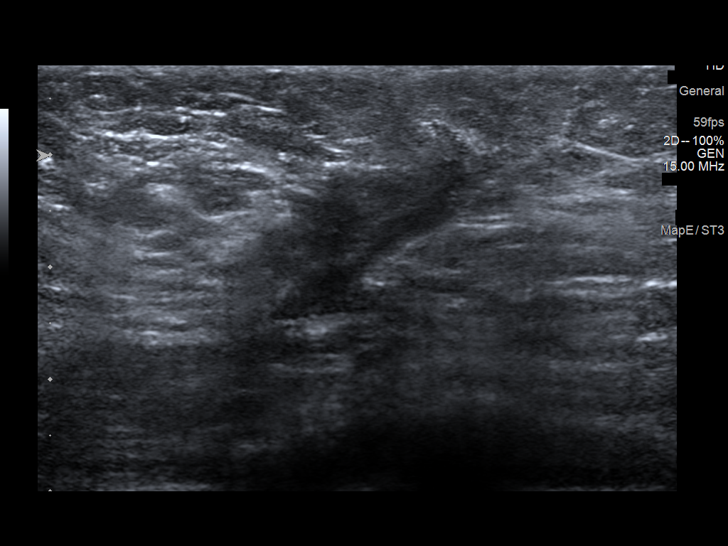
[im 2/12]
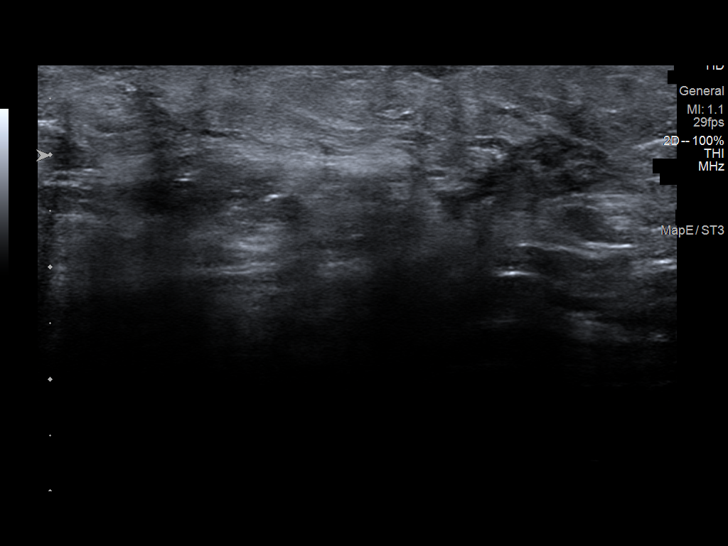
[im 3/12]
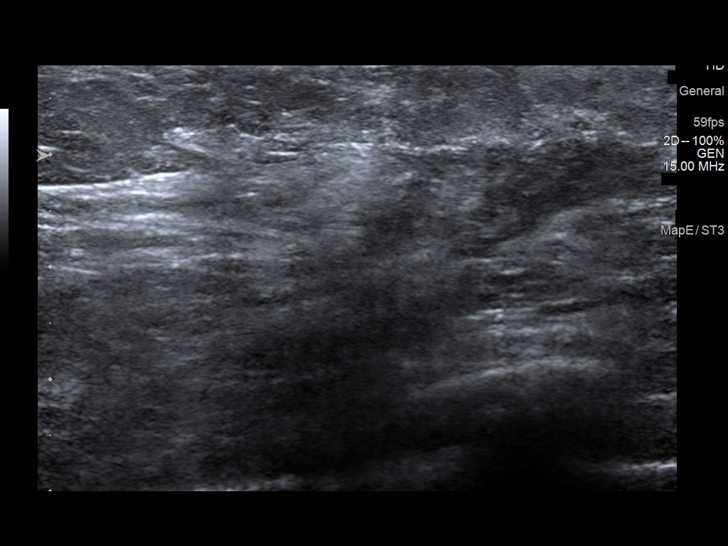
[im 4/12]
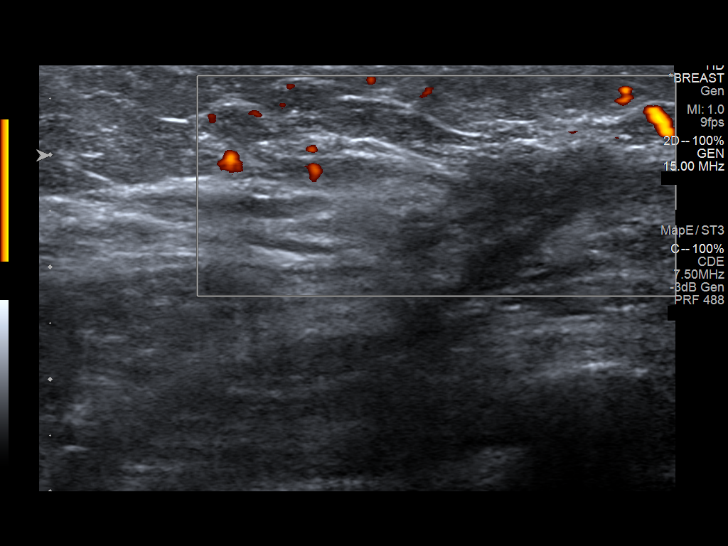
[im 5/12]
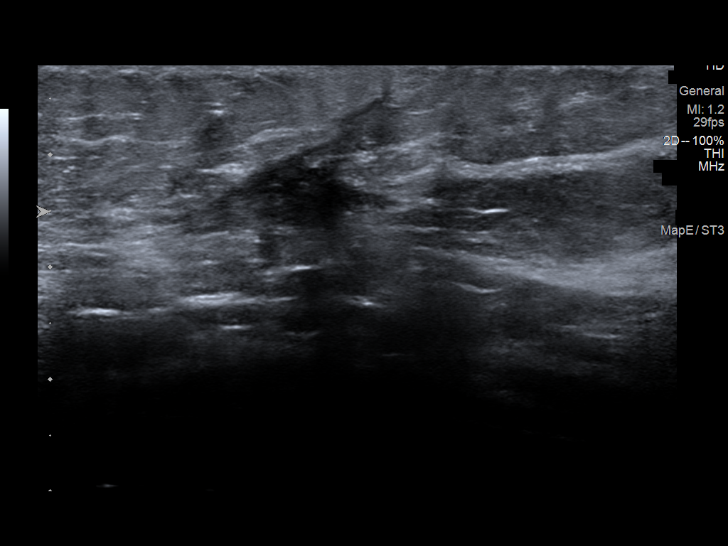
[im 6/12]
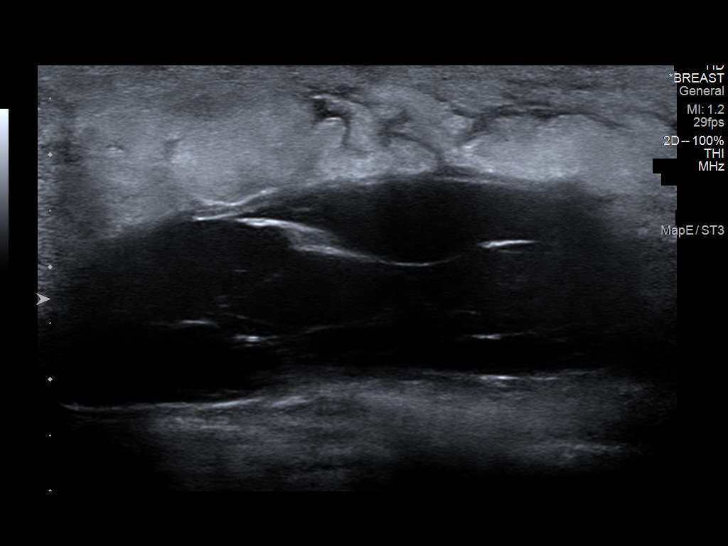
[im 7/12]
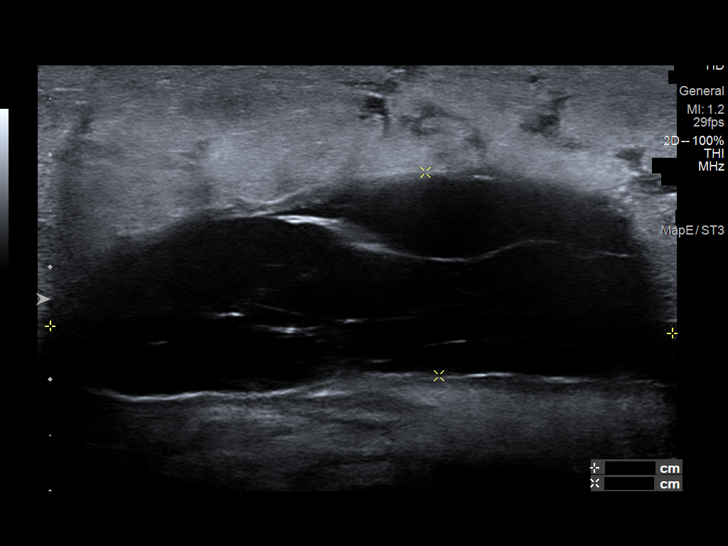
[im 8/12]
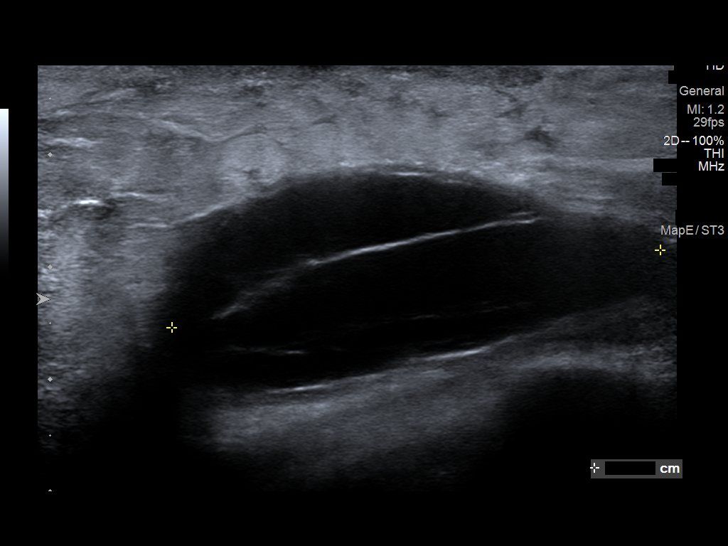
[im 9/12]
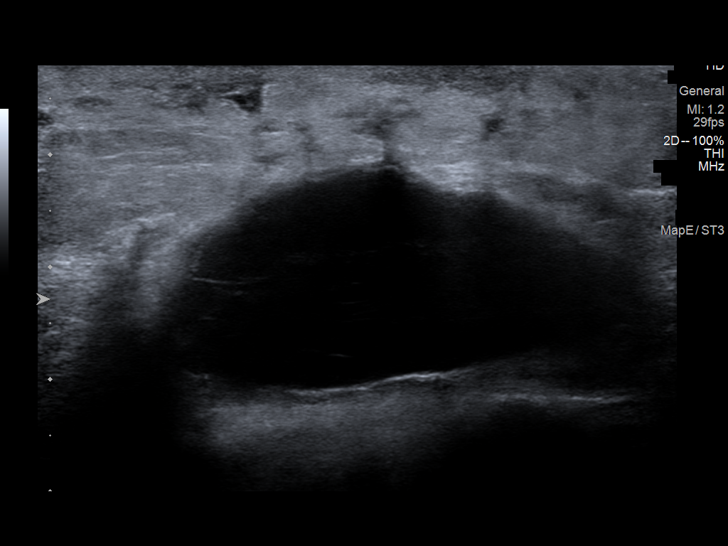
[im 10/12]
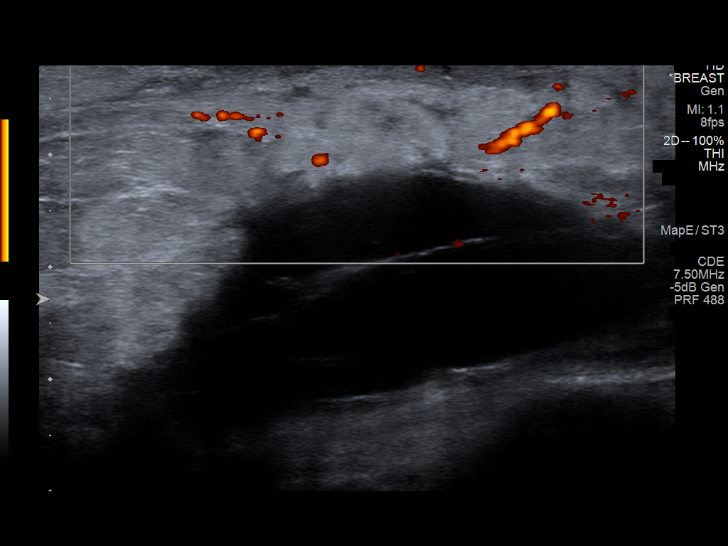
[im 11/12]
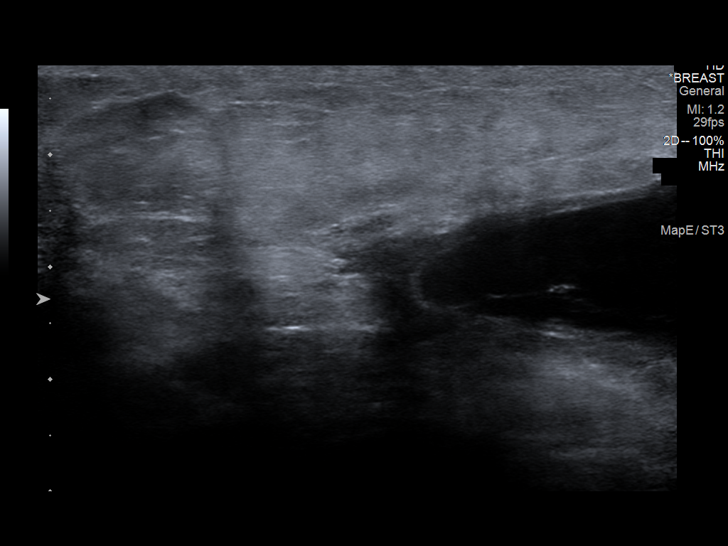
[im 12/12]
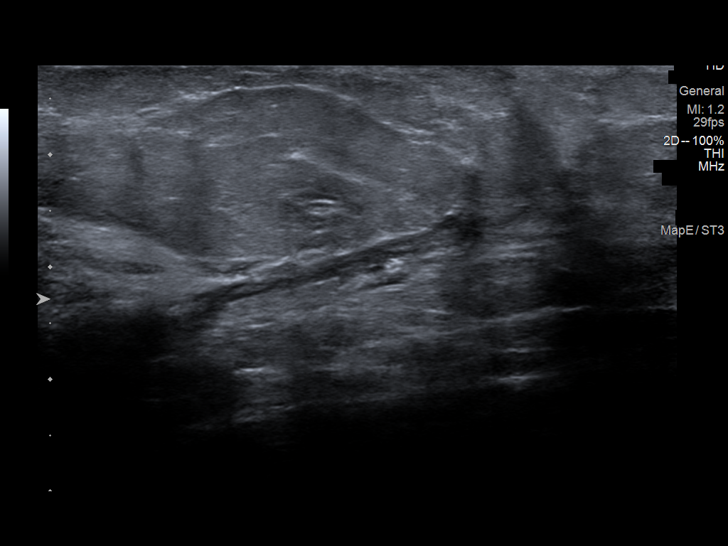

[12 of 12 positions shown; findings below may reference images not displayed]

FINDINGS: Targeted ultrasound is performed, showing a complicated fluid
collection deep in the left breast measuring 5.5 x 1.8 x 4.4 cm with
a few internal septations and internal debris.

Scarring is seen in the left axilla with no suspicious mass or lymph
node.
IMPRESSION: 1. The fluid collection in the left breast is likely a postoperative
seroma. Whether the seroma is sterile or infected cannot be
determined by imaging.
2. Scarring in the left axilla.

RECOMMENDATION:
If the patient and the surgeon would like the seroma to be drained,
we would be happy to aspirate. No imaging follow-up needed in the
left axilla for the scarring.

I have discussed the findings and recommendations with the patient.
If applicable, a reminder letter will be sent to the patient
regarding the next appointment.

BI-RADS CATEGORY  2: Benign.

## 2021-09-26 NOTE — Therapy (Signed)
OUTPATIENT PHYSICAL THERAPY TREATMENT NOTE   Patient Name: Tracy Dyer MRN: [id number] DOB:05/28/1971, 51 y.o., female Today's Date: 09/26/2021  PCP: [name], MD REFERRING PROVIDER: [name], MD   PT End of Session - 09/26/21 [phone]     Visit Number 5    Number of Visits 12    Date for PT Re-Evaluation 10/23/21    PT Start Time 0808    PT Stop Time 0846    PT Time Calculation (min) 38 min    Activity Tolerance Patient tolerated treatment well    Behavior During Therapy [facility] for tasks assessed/performed               Past Medical History:  Diagnosis Date   Anxiety    Cancer ([place])    breast   Depression    Family history of breast cancer    Family history of lung cancer    Family history of ovarian cancer    GERD (gastroesophageal reflux disease)    diet controlled   History of radiation therapy    Left breast, left axilla- 05/27/21-07/10/21- Dr. [name]   Seasonal allergies    Past Surgical History:  Procedure Laterality Date   BREAST BIOPSY Left 2019   x 2 biopsy   BREAST EXCISIONAL BIOPSY Left    BREAST LUMPECTOMY WITH RADIOACTIVE SEED AND SENTINEL LYMPH NODE BIOPSY Left 02/26/2021   Procedure: LEFT BREAST LUMPECTOMY WITH RADIOACTIVE SEED x2; LEFT AXILLARY SENTINEL NODE BIOPSY;  Surgeon: [name], MD;  Location: [place];  Service: General;  Laterality: Left;   BREAST LUMPECTOMY WITH RADIOFREQUENCY TAG IDENTIFICATION Left 01/22/2978   Procedure: LEFT BREAST MRI WIRE GUIDED EXCISION;  Surgeon: [name], MD;  Location: [place];  Service: General;  Laterality: Left;   BREAST REDUCTION SURGERY Bilateral 04/18/2021   Procedure: MAMMARY REDUCTION  (BREAST) LEFT ONCOPLASTIC BREAST RECONSTRUCTION, RIGHT BREAST REDUCTION;  Surgeon: Irene Limbo, MD;  Location: [place];  Service: Plastics;  Laterality: Bilateral;   CHOLECYSTECTOMY  2008   PORT-A-CATH REMOVAL Right  04/18/2021   Procedure: REMOVAL PORT-A-CATH;  Surgeon: [name], MD;  Location: [place];  Service: General;  Laterality: Right;   PORTACATH PLACEMENT N/A 09/05/2020   Procedure: INSERTION PORT-A-CATH;  Surgeon: [name], MD;  Location: [place];  Service: General;  Laterality: N/A;  [place] EXTRACTION     Patient Active Problem List   Diagnosis Date Noted   Occipital neuralgia of right side 07/01/2021   Peripheral vertigo 07/01/2021   Port-A-Cath in place 09/20/2020   Genetic testing 09/04/2020   Family history of breast cancer    Family history of ovarian cancer    Family history of lung cancer    Malignant neoplasm of upper-outer quadrant of left breast in female, estrogen receptor positive ([place]) 08/21/2020   Anxiety 11/16/2013   Hypercoagulable state ([place]) 11/16/2013   TIA (transient ischemic attack) 11/15/2013   Chest discomfort 01/20/2012   Factor V Leiden ([place]) 01/20/2012   MVP (mitral valve prolapse) 01/20/2012   Acute bronchitis 12/23/2011    REFERRING DIAG: Left breast swelling and axillary tightness  THERAPY DIAG:  Malignant neoplasm of upper-outer quadrant of left breast in female, estrogen receptor positive ([place])  Localized edema  Abnormal posture  Pain in Rt foot  PERTINENT HISTORY: Lt lumpectomy with SLNB 0/4 LN on 02/26/21, reduction and re-excision on 04/18/21 with chemotherapy and radiation completed   PRECAUTIONS: Rt shoulder RTC, lymphedema  Lt risk, TIA  SUBJECTIVE:  I am on antibiotics now for an infection because I felt like the flu all over and the left breast is swelling.  I have an [place] later today  PAIN:  Are you having pain? Yes NPRS scale: 3/10 Pain location:  left breast Pain orientation: left PAIN TYPE: aching Pain description: constant and dull  Aggravating factors: reaching Relieving factors: rest  TODAY'S TREATMENT  09/26/21 TE:   Pulleys 24min flexion and abduction Wall ball flexion 6"  x 6 with cording pull noted  Doorway stretch 2x20"   MT: Soft tissue mobilization modified to avoid breast with infection: focused on MFR to left axillary region and upper arm during stretching with cording release PROM left shoulder flexion, scaption, Abd, ER   09/22/21 TE:   Pulleys 10min flexion and abduction with initial cueing giving pt info on self ordering Wall ball flexion 6" x 6 with cording pull noted  Wall ball abduction left 5" x 5 with initial performance cueing  Doorway stretch 2x20"   MT: Soft tissue mobilization to Left UT, left pectorals and lats, and in right SL left lats and scapular area and left posterior shoulder. MFR to left axillary region and upper arm during stretching with cording release PROM left shoulder flexion, scaption, Abd, ER    HOME EXERCISE PROGRAM: Abd wall slides, counter lat stretch, stargazer in supine on left Access Code: [email or web address]    ASSESSMENT:   CLINICAL IMPRESSION:Pt with new Left breast possible infection and fluid collection.  Will have [place] assessment today after PT.  Continued MFR techniques to areas of cording, PROM and had pt perform AA/ROM exercises.    GOALS: Goals reviewed with patient? Yes   SHORT TERM GOALS:   STG Name Target Date Goal status  1 Pt will be independent and compliant with HEP Baseline:  10/02/2021 INITIAL  LONG TERM GOALS:    LTG Name Target Date Goal status  1 Pt will have decreased left axillary/breast pain by 50% or greater Baseline: 10/23/2021 INITIAL  2 Pt will have improved left shoulder abduction to 160 degrees  Baseline:    10/23/2021 INITIAL  3 Pt will no longer complain of tightness with cording at end ranges Baseline: 10/23/2021 INITIAL  4 Pt will attend ABC class Baseline: 10/23/2021 INITIAL   PLAN: PT FREQUENCY: 2x/week   PT DURATION: 6 weeks   PLANNED INTERVENTIONS: Therapeutic exercises, Neuro Muscular re-education, Patient/Family education, Orthotic/Fit training, Dry Needling, scar  mobilization, and Manual therapy, aquatic therapy   PLAN FOR NEXT SESSION: Any infection? Aspiration? Need any breast MLD or edema treatment? MFR to axillary cording    Access Code: [email or web address]: [email or web address]: 02/13/2023Prepared by: Marcene Brawn TevisExercises  Supine Lower Trunk Rotation - 1 x daily - 7 x weekly - 1 sets - 10 reps - 3 second hold  Supine Hamstring Stretch with Strap - 1 x daily - 7 x weekly - 1-3 sets - 3 reps - 20-30 seconds hold  Supine Sciatic Nerve Glide - 1 x daily - 7 x weekly - 1 sets - 10 reps - 3 second hold     Stark Bray, PT 09/26/2021, 8:47 AM

## 2021-09-30 ENCOUNTER — Other Ambulatory Visit: Payer: BC Managed Care – PPO

## 2021-09-30 ENCOUNTER — Ambulatory Visit
Admission: RE | Admit: 2021-09-30 | Discharge: 2021-09-30 | Disposition: A | Discharge status: Home / Self Care | Payer: BC Managed Care – PPO | Source: Ambulatory Visit | Attending: Adult Health | Admitting: Adult Health

## 2021-09-30 ENCOUNTER — Other Ambulatory Visit: Payer: Self-pay | Admitting: General Surgery

## 2021-09-30 DIAGNOSIS — Z170 Estrogen receptor positive status [ER+]: Secondary | ICD-10-CM

## 2021-09-30 DIAGNOSIS — N6489 Other specified disorders of breast: Secondary | ICD-10-CM

## 2021-09-30 DIAGNOSIS — R922 Inconclusive mammogram: Secondary | ICD-10-CM | POA: Diagnosis not present

## 2021-09-30 DIAGNOSIS — C50412 Malignant neoplasm of upper-outer quadrant of left female breast: Secondary | ICD-10-CM

## 2021-10-01 ENCOUNTER — Other Ambulatory Visit: Payer: Self-pay

## 2021-10-01 ENCOUNTER — Ambulatory Visit
Admission: RE | Admit: 2021-10-01 | Discharge: 2021-10-01 | Disposition: A | Discharge status: Home / Self Care | Payer: BC Managed Care – PPO | Source: Ambulatory Visit | Attending: General Surgery | Admitting: General Surgery

## 2021-10-01 ENCOUNTER — Ambulatory Visit (HOSPITAL_COMMUNITY)
Admission: RE | Admit: 2021-10-01 | Discharge: 2021-10-01 | Disposition: A | Discharge status: Home / Self Care | Payer: BC Managed Care – PPO | Source: Ambulatory Visit | Attending: Hematology and Oncology | Admitting: Hematology and Oncology

## 2021-10-01 DIAGNOSIS — Z853 Personal history of malignant neoplasm of breast: Secondary | ICD-10-CM | POA: Diagnosis not present

## 2021-10-01 DIAGNOSIS — N611 Abscess of the breast and nipple: Secondary | ICD-10-CM | POA: Diagnosis not present

## 2021-10-01 DIAGNOSIS — C50412 Malignant neoplasm of upper-outer quadrant of left female breast: Secondary | ICD-10-CM | POA: Insufficient documentation

## 2021-10-01 DIAGNOSIS — Z01810 Encounter for preprocedural cardiovascular examination: Secondary | ICD-10-CM | POA: Diagnosis not present

## 2021-10-01 DIAGNOSIS — Z7189 Other specified counseling: Secondary | ICD-10-CM | POA: Diagnosis not present

## 2021-10-01 DIAGNOSIS — Z170 Estrogen receptor positive status [ER+]: Secondary | ICD-10-CM | POA: Insufficient documentation

## 2021-10-01 DIAGNOSIS — N6489 Other specified disorders of breast: Secondary | ICD-10-CM

## 2021-10-01 LAB — GLUCOSE, CAPILLARY: Glucose-Capillary: 89 mg/dL (ref 70–99)

## 2021-10-01 IMAGING — US US BREAST CYST ASPIRATION 1ST CYST
1 series · 6 of 6 positions shown · non-contrast
Comparison: Previous exams.

CLINICAL DATA: Painful left breast seroma.  Aspiration requested.

EXAM:
ULTRASOUND GUIDED LEFT BREAST CYST ASPIRATION

[Series 1: us breast cyst aspiration 1st cyst · 0.07mm/px · 6 of 6 slices shown]
[im 1/6]
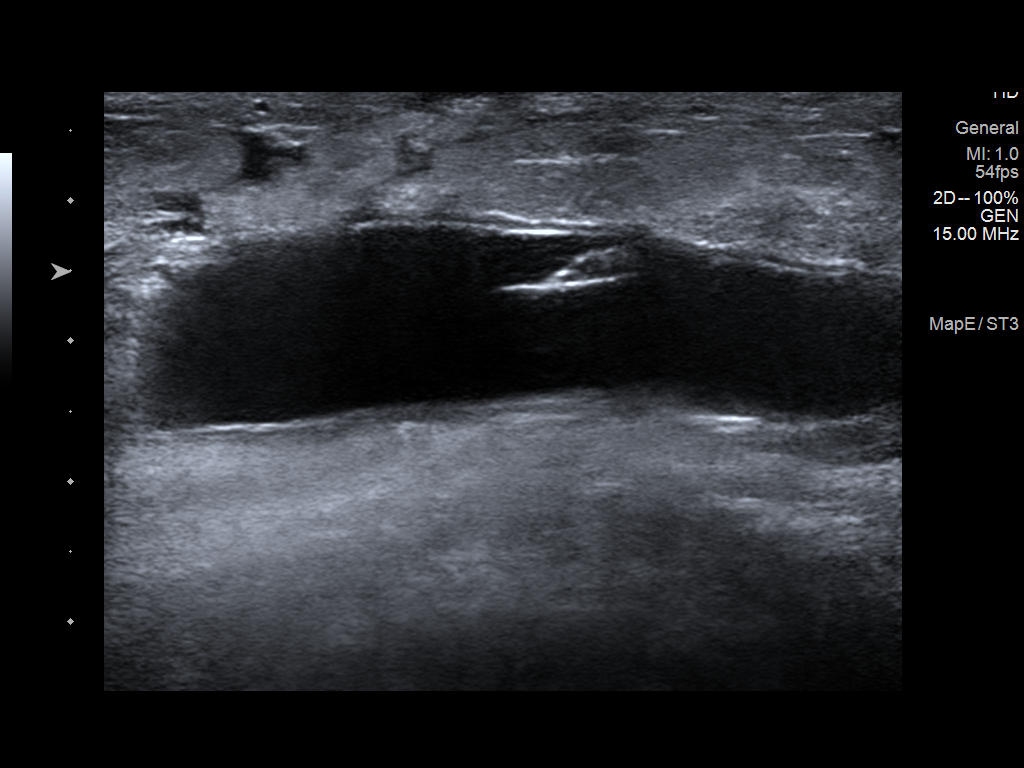
[im 2/6]
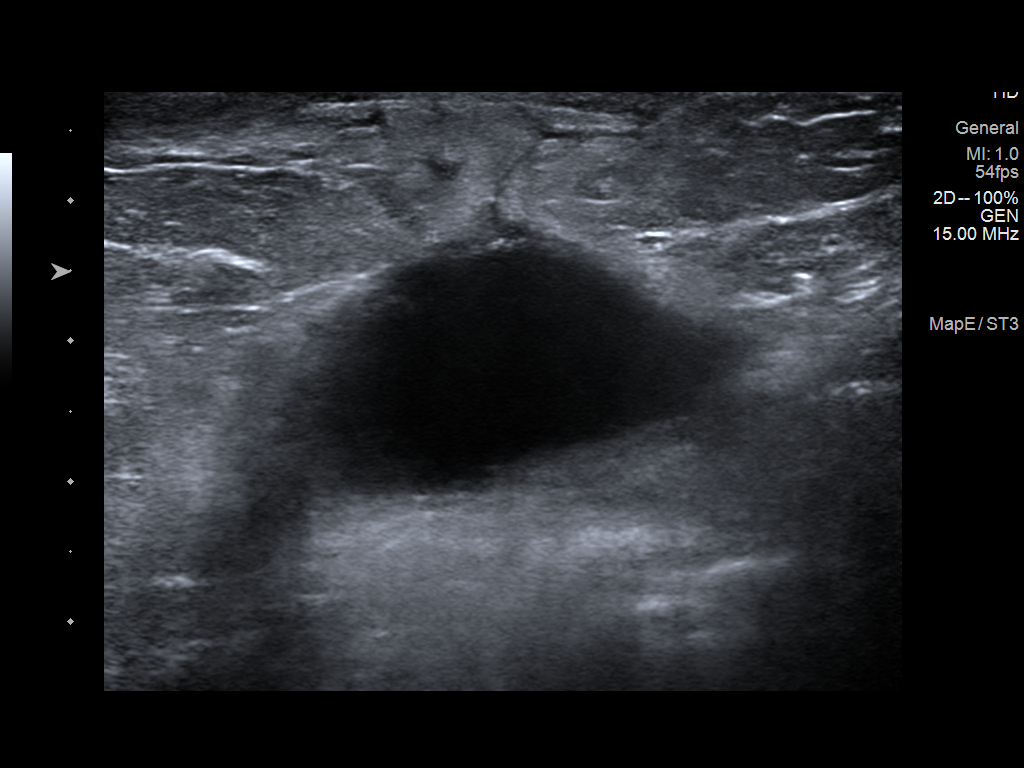
[im 3/6]
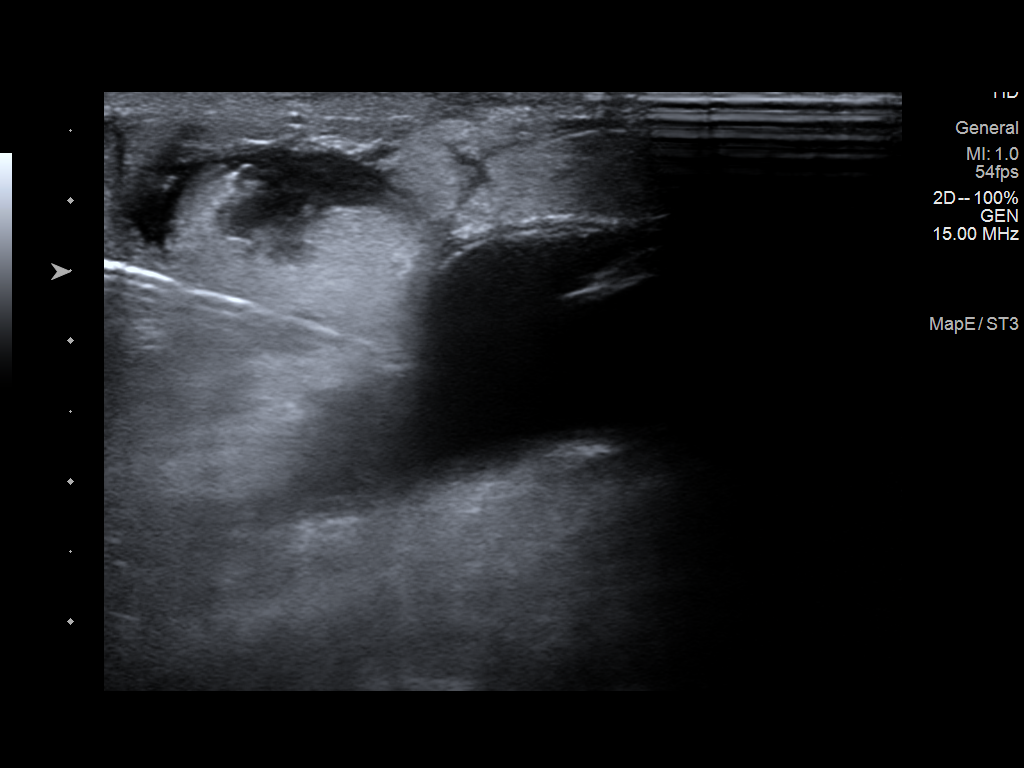
[im 4/6]
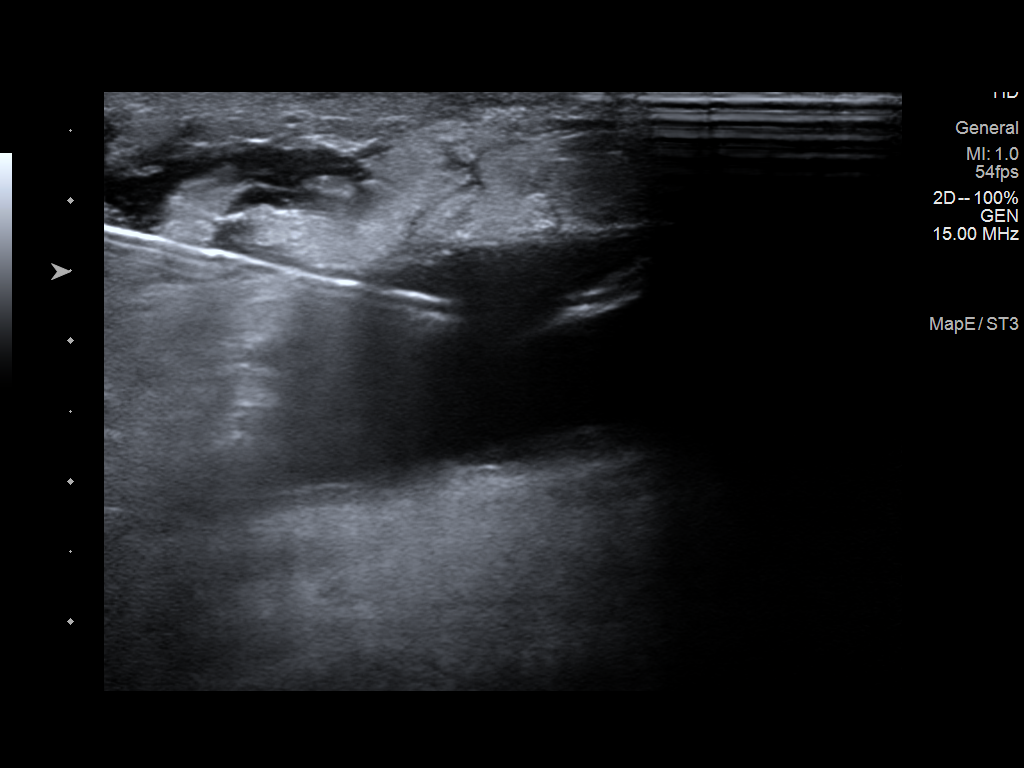
[im 5/6]
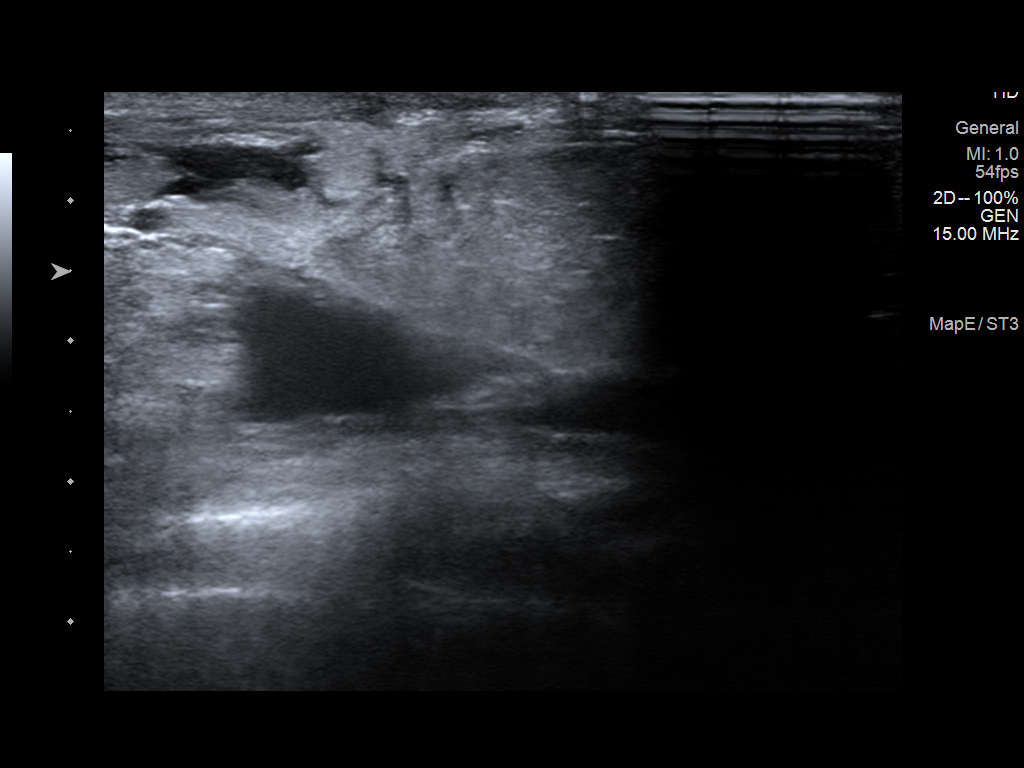
[im 6/6]
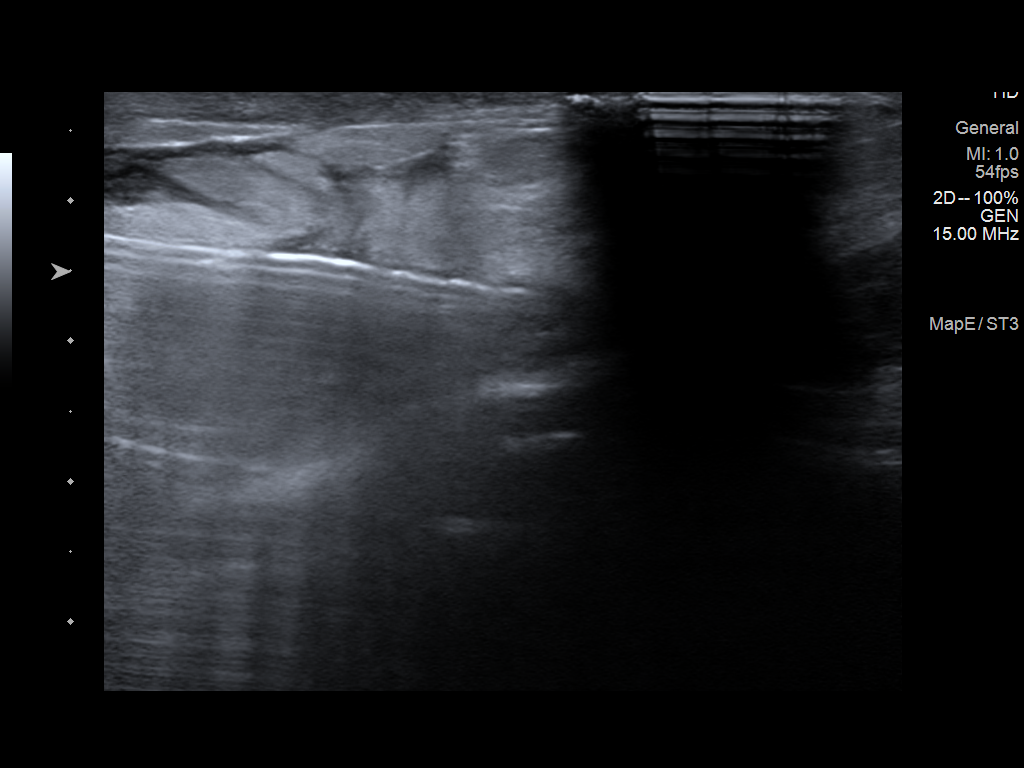

[6 of 6 positions shown; findings below may reference images not displayed]

PROCEDURE:
The patient and I discussed the procedure of ultrasound-guided
aspiration including benefits and alternatives. We discussed the
high likelihood of a successful procedure. We discussed the risks of
the procedure including infection, bleeding, tissue injury, and
inadequate sampling. Informed written consent was given. The usual
time out protocol was performed immediately prior to the procedure.

Using sterile technique, 1% lidocaine, under direct ultrasound
visualization, needle aspiration of a seroma in the 6 o'clock region
of the left breast was performed. Approximally 25 cc of clear serous
fluid was drained from the seroma in the 6 o'clock region of the
left breast.
IMPRESSION: Ultrasound-guided aspiration of a seroma in the 6 o'clock region of
the left breast no apparent complications.

RECOMMENDATIONS:
Patient states that she is having bilateral mastectomies. Yearly
mammography will no longer be necessary following the mastectomies.

## 2021-10-01 IMAGING — PT NM PET TUM IMG RESTAG (PS) SKULL BASE T - THIGH
7 of 8 series · 20 of 25 positions shown · non-contrast
Comparison: None.

CLINICAL DATA: Initial treatment strategy for lung nodule with
history of invasive breast cancer in a 50-year-old female.

EXAM:
NUCLEAR MEDICINE PET SKULL BASE TO THIGH
TECHNIQUE: 10.1 mCi F-18 FDG was injected intravenously. Full-ring PET imaging
was performed from the skull base to thigh after the radiotracer. CT
data was obtained and used for attenuation correction and anatomic
localization.
Fasting blood glucose: 89 mg/dl

[Series 3: pet sk_thigh ac · axial · 5.0mm · 4.07mm/px · z∈[-1325,-381]mm · 4 of 237 slices shown]
[im 1/237]
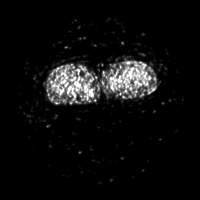
[im 60/237]
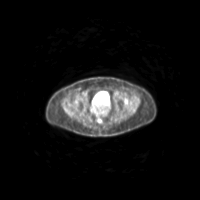
[im 178/237]
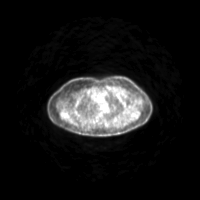
[im 237/237]
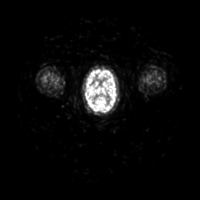

[Series 4: ct sk_thigh 5.0 bf37 · axial · 5.0mm · 0.98mm/px · z∈[-1325,-381]mm · 4 of 237 slices shown]
[im 1/237]
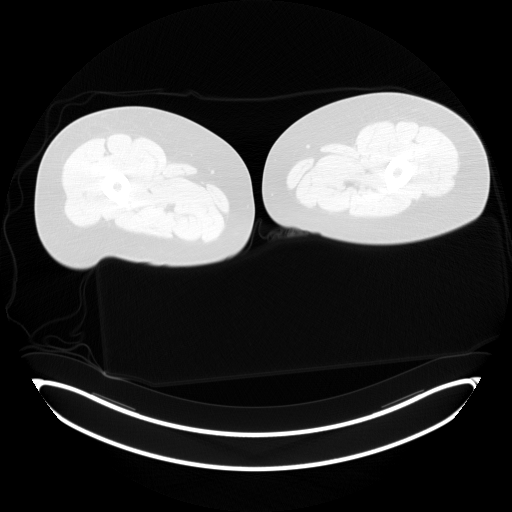
[im 60/237]
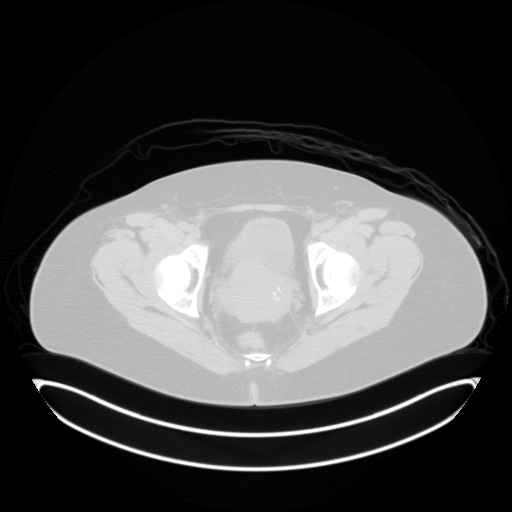
[im 178/237]
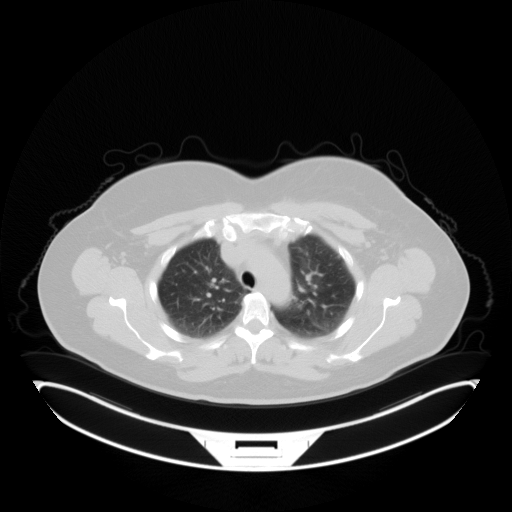
[im 237/237  brain]
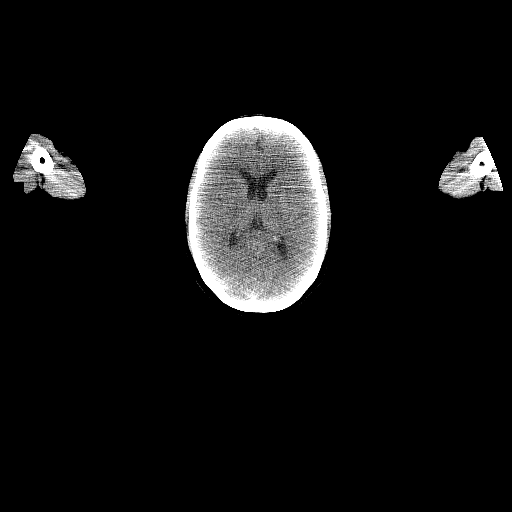

[Series 5: pet sk_thigh nac · axial · 5.0mm · 4.07mm/px · z∈[-1325,-381]mm · 5 of 237 slices shown]
[im 1/237]
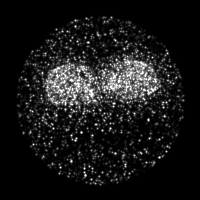
[im 48/237]
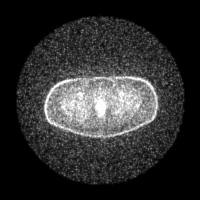
[im 142/237]
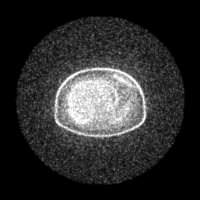
[im 189/237]
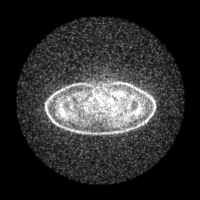
[im 237/237]
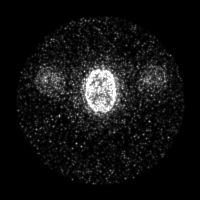

[Series 8: ct sk_thigh 5.0 (id) lung_bone · axial · 5.0mm · 0.55mm/px · 1 of 57 slices shown]
[im 1/57  brain]
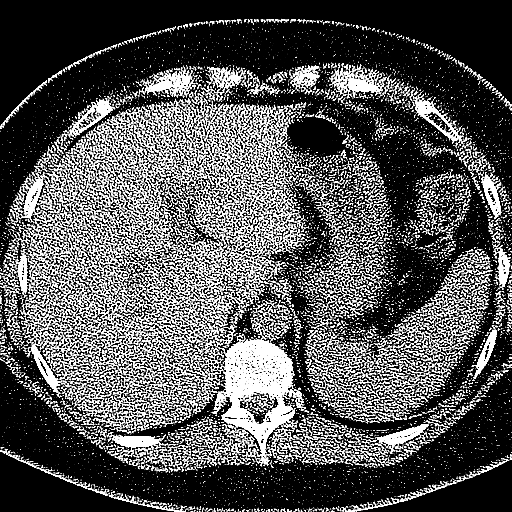

[Series 604: <mip collection> · coronal · 1.96mm/px · 1 of 32 slices shown]
[im 1/32]
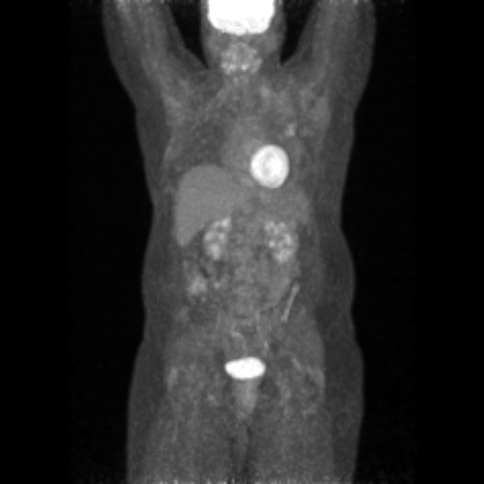

[Series 605: range-ct sk_thigh 5.0 bf37-tra-<alpha range> · 4 of 231 slices shown]
[im 1/231]
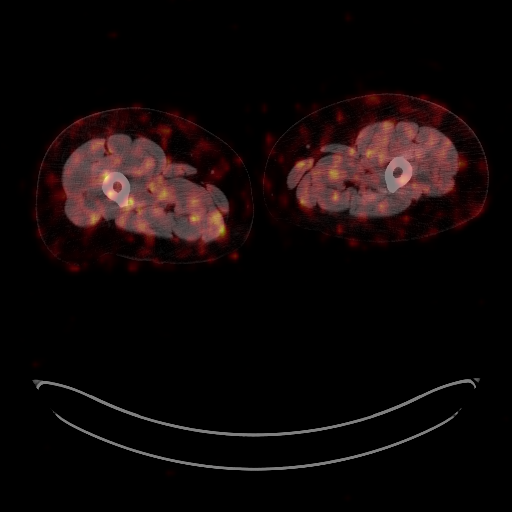
[im 58/231]
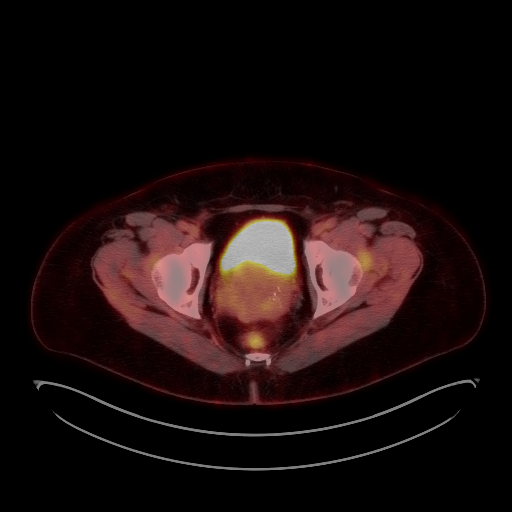
[im 116/231]
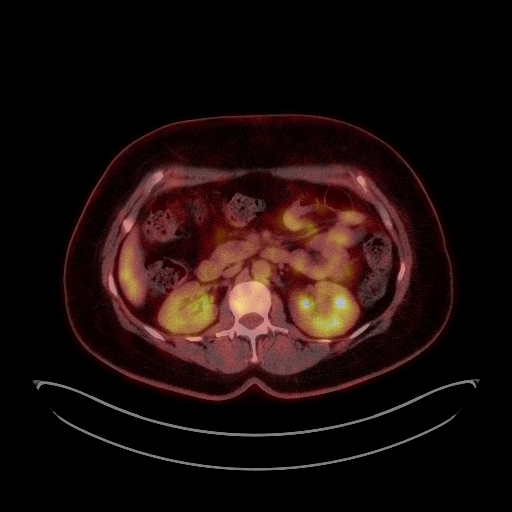
[im 231/231]
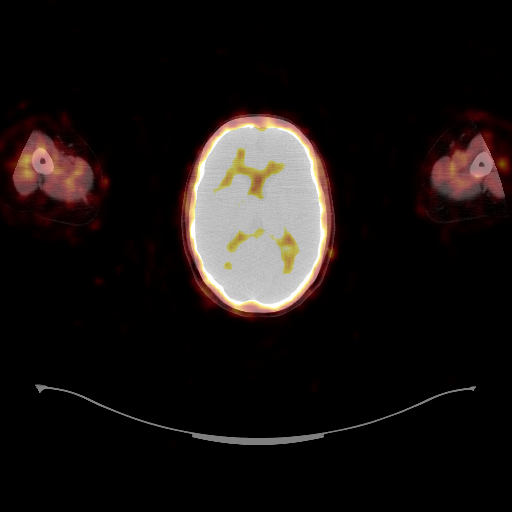

[Series 1060: results mm oncology reading · 1.0mm · 0.79mm/px · 1 of 3 slices shown]
[im 1/3]
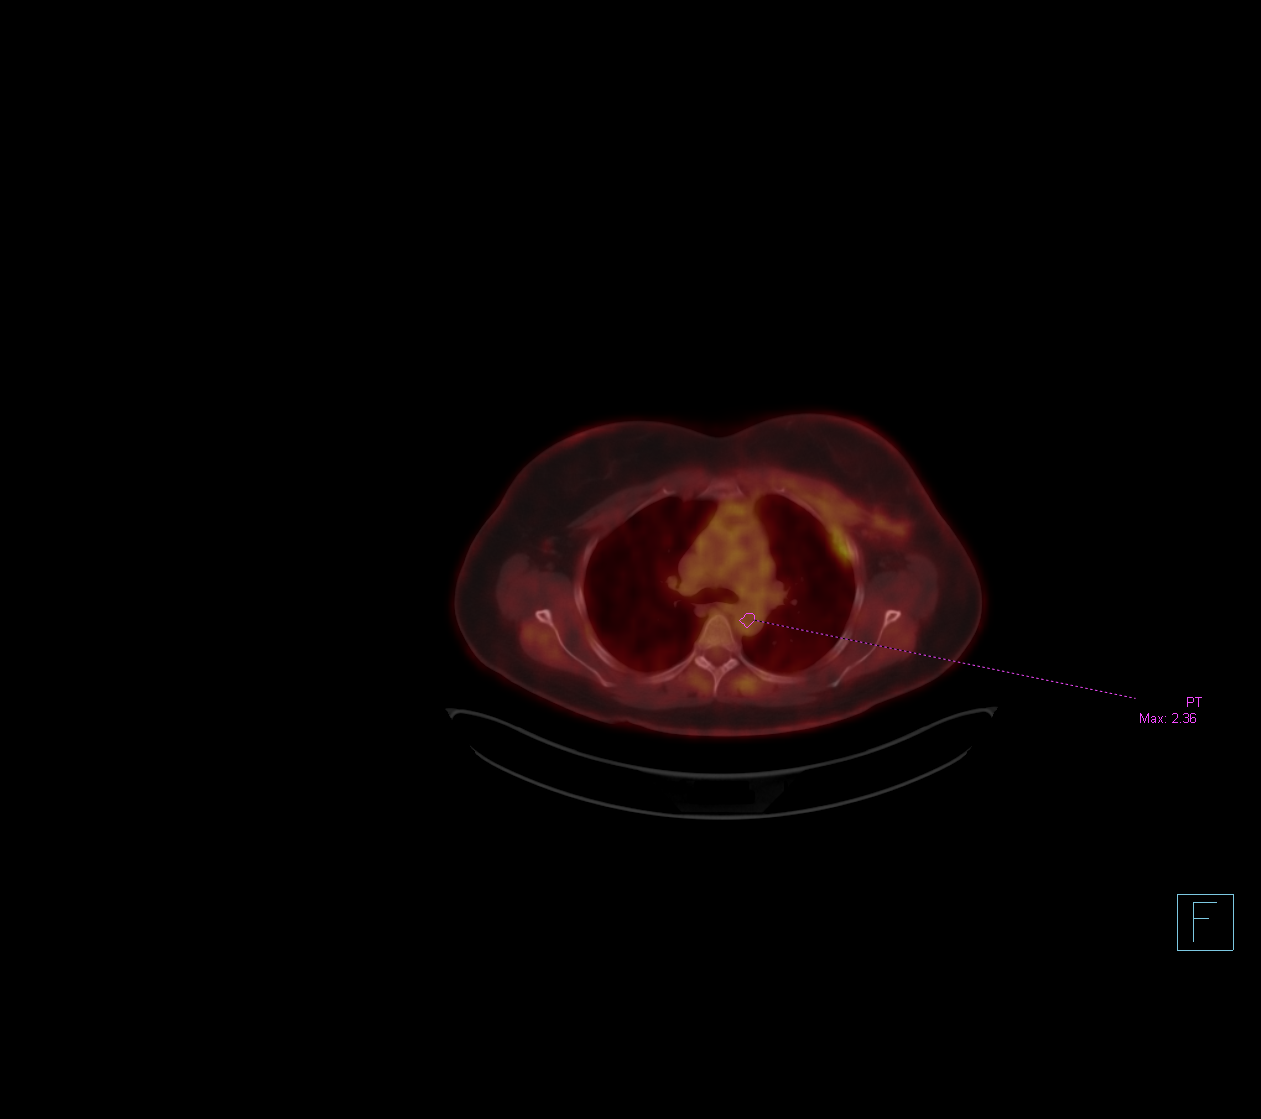

[20 of 25 positions shown; findings below may reference images not displayed]

FINDINGS: Mediastinal blood pool activity: SUV max

Liver activity: SUV max NA

NECK: No hypermetabolic lymph nodes in the neck.

Incidental CT findings: none

CHEST: Mild increased metabolic activity along pleural thickening
and ground-glass in the LEFT upper lobe deep to LEFT second and
third ribs, appearance compatible with post radiation change.
Maximum SUV 4.2.

No substantial increased metabolic activity corresponding to areas
of concern in the LEFT upper lobe just cephalad to pleural
thickening that have a more bandlike appearance on coronal images
and slightly more nodular appearance on axial images, (image 53/4) 9
mm LEFT upper lobe ground-glass nodule with less well-defined areas
of ground-glass tracking towards the pleural surface in the LEFT
upper lobe. No signs of increased metabolic activity associated with
lymph nodes in the chest. No additional pulmonary nodules or
suspicious findings associated with lung parenchyma.

Ovoid water density, well-circumscribed area in the inferior LEFT
breast. (Image 92/4) generalized mildly increased metabolic activity
along the deep aspect is not substantially above mediastinal blood
pool but is mildly increased with respect to other adjacent soft
tissues. Mild stranding in the inframammary fold. Maximum SUV
approximately 2.56.

Vague area of added density in the LEFT axilla measuring 2.1 x
cm (image 65/4)

Incidental CT findings: none

ABDOMEN/PELVIS: No abnormal hypermetabolic activity within the
liver, pancreas, adrenal glands, or spleen. No hypermetabolic lymph
nodes in the abdomen or pelvis.

Incidental CT findings: Post cholecystectomy. No acute findings
relative to pancreas, spleen, adrenal glands or kidneys.
Gastrointestinal tract without acute process. Normal appendix. No
adenopathy in the abdomen or pelvis by size criteria.

SKELETON: No focal hypermetabolic activity to suggest skeletal
metastasis.

Incidental CT findings: Sclerotic lesion in the RIGHT iliac bone
without increased metabolic activity more compatible with a bone
island.
IMPRESSION: No signs of disease recurrence in the neck, chest, abdomen or
pelvis. No evidence of metastatic disease.

Post radiation change in the LEFT upper lobe along the pleural
surface as discussed.

Minimal increased metabolic activity of fluid collection in the
inferior LEFT breast, likely seroma and surrounding postoperative
changes. Correlate with any clinical signs of infection or
inflammation.

Vague nodular area in the LEFT axilla also without evidence of
disease recurrence, imaging findings are more compatible with
postoperative changes. This and the prior imaging evaluation from
[DATE] will serve as a baseline for future follow-up.

Areas of scattered ground-glass in the LEFT upper lobe are almost
certainly related to post radiation change as well. However, 1 area
has a more nodular appearance particularly in the axial plane in the
LEFT lung apex. Consider three-month follow-up with chest CT to
track for changes. Radiographic appearance is not suggestive of
metastatic disease but bronchogenic neoplasm in this location while
felt unlikely would be a differential consideration at this time.

## 2021-10-01 MED ORDER — FLUDEOXYGLUCOSE F - 18 (FDG) INJECTION
10.1000 | Freq: Once | INTRAVENOUS | Status: AC | PRN
  Administered 2021-10-01: 9.91 via INTRAVENOUS

## 2021-10-01 NOTE — Assessment & Plan Note (Signed)
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 ?Treatment plan: ?1.??Neoadjuvant chemotherapy with dose dense Adriamycin and Cytoxan followed by Taxol weekly x12?completed 01/17/2021 ?2.?02/26/2021?breast conserving surgery with sentinel lymph node and targeted node dissection,: Path CR 0/4 LN Neg ?04/18/21: Bilateral Mammoplasty: Benign ?3.??Adjuvant radiation therapy?05/28/21- 07/10/21 ?4.??Followed by adjuvant antiestrogen therapy with tamoxifen--unable to tolerate, on Letrozole daily switched to exemestane ?------------------------------------------------------------------------------------------------------------------------- ?Exemestane Toxicities:  ? ?PET CT 10/01/21 ?

## 2021-10-01 NOTE — Progress Notes (Signed)
Patient Care Team: [name], MD as PCP - General (Family Medicine) [name], MD as Consulting Physician (General Surgery)  DIAGNOSIS:    ICD-10-CM   1. Malignant neoplasm of upper-outer quadrant of left breast in female, estrogen receptor positive ([place])  C50.412    Z17.0       SUMMARY OF ONCOLOGIC HISTORY: Oncology History  Malignant neoplasm of upper-outer quadrant of left breast in female, estrogen receptor positive ([place])  08/21/2020 Initial Diagnosis   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 No pathogenic variants identified on the [facility] Breast Cancer STAT Panel + Multi-Cancer Panel. VUS in [facility] called c.983C>T identified. The report date is 09/04/2020.   The STAT Breast cancer panel offered by [facility] includes sequencing and rearrangement analysis for the following 9 genes:  ATM, BRCA1, BRCA2, CDH1, CHEK2, PALB2, PTEN, STK11 and TP53.    The Multi-Cancer Panel offered by [facility] includes sequencing and/or deletion duplication testing of the following 85 genes: AIP, ALK, APC, ATM, AXIN2,BAP1,  BARD1, BLM, BMPR1A, BRCA1, BRCA2, BRIP1, CASR, CDC73, CDH1, CDK4, CDKN1B, CDKN1C, CDKN2A (p14ARF), CDKN2A (p16INK4a), CEBPA, CHEK2, CTNNA1, DICER1, DIS3L2, EGFR (c.2369C>T, p.Thr790Met variant only), EPCAM (Deletion/duplication testing only), FH, FLCN, GATA2, GPC3, GREM1 (Promoter region  deletion/duplication testing only), HOXB13 (c.251G>A, p.Gly84Glu), HRAS, KIT, MAX, MEN1, MET, MITF (c.952G>A, p.Glu318Lys variant only), MLH1, MSH2, MSH3, MSH6, MUTYH, NBN, NF1, NF2, NTHL1, PALB2, PDGFRA, PHOX2B, PMS2, POLD1, POLE, POT1, PRKAR1A, PTCH1, PTEN, RAD50, RAD51C, RAD51D, RB1, RECQL4, RET, RNF43, RUNX1, SDHAF2, SDHA (sequence changes only), SDHB, SDHC, SDHD, SMAD4, SMARCA4, SMARCB1, SMARCE1, STK11, SUFU, TERC, TERT, TMEM127, TP53, TSC1, TSC2, VHL, WRN and WT1.    09/06/2020 - 01/17/2021 Chemotherapy   AC X 4 foll by Taxol       02/26/2021 Surgery   Left lumpectomy: no residual carcinoma with margins and axillary lymph nodes also negative for carcinoma       CHIEF COMPLIANT: Follow-up of breast cancer  INTERVAL HISTORY: Tracy Dyer is a 51 y.o. with above-mentioned history of breast cancer treated with neoadjuvant chemotherapy with Adriamycin Cytoxan followed by Taxol and subsequently had a lumpectomy. She presents to the clinic today for follow-up.  She had fluid drained from her left breast.  She is still sore from that.  She has not started exemestane yet.  ALLERGIES:  is allergic to sulfa antibiotics and sulfamethoxazole.  MEDICATIONS:  Current Outpatient Medications  Medication Sig Dispense Refill   albuterol (VENTOLIN HFA) 108 (90 Base) MCG/ACT inhaler Inhale 1-2 puffs into the lungs every 4 (four) hours as needed for wheezing or shortness of breath. 8 g 2   calcium carbonate (TUMS - DOSED IN MG ELEMENTAL CALCIUM) 500 MG chewable tablet Chew 4 tablets by mouth as needed for indigestion or heartburn.     clonazePAM (KLONOPIN) 1 MG tablet TAKE 1 TABLET BY MOUTH TWICE A DAY AS NEEDED FOR ANXIETY 60 tablet 1   exemestane (AROMASIN) 25 MG tablet Take 1 tablet (  25 mg total) by mouth daily after breakfast. 30 tablet 1   fluticasone (FLONASE) 50 MCG/ACT nasal spray Place 1 spray into both nostrils daily as needed for allergies.     l-methylfolate-B6-B12 (METANX) 3-35-2 MG TABS tablet  Take 1 tablet by mouth daily.     LORazepam (ATIVAN) 0.5 MG tablet TAKE 1 TABLET BY MOUTH THREE TIMES A DAY AS NEEDED 40 tablet 0   MAGNESIUM CITRATE PO Take 405 mg by mouth daily. 135 mg     meloxicam (MOBIC) 7.5 MG tablet Take 1 tablet (7.5 mg total) by mouth daily as needed for pain. 14 tablet 0   Multiple Vitamins-Minerals (MULTIVITAMIN WITH MINERALS) tablet Take 1 tablet by mouth daily.     omeprazole (PRILOSEC) 20 MG capsule Take 1 capsule (20 mg total) by mouth daily. 30 capsule 3   oxyCODONE (ROXICODONE) 5 MG immediate release tablet Take 1 tablet (5 mg total) by mouth every 4 (four) hours as needed. 20 tablet 0   pseudoephedrine (SUDAFED) 30 MG tablet Take 30 mg by mouth one time in dialysis.     sertraline (ZOLOFT) 100 MG tablet Take 1 tablet (100 mg total) by mouth at bedtime. 30 tablet 0   Turmeric (CURCUMIN 95 PO) Take by mouth.     vitamin B-12 (CYANOCOBALAMIN) 100 MCG tablet Take 100 mcg by mouth daily.     No current facility-administered medications for this visit.    PHYSICAL EXAMINATION: ECOG PERFORMANCE STATUS: 1 - Symptomatic but completely ambulatory  There were no vitals filed for this visit. There were no vitals filed for this visit.  BREAST: No palpable masses or nodules in either right or left breasts. No palpable axillary supraclavicular or infraclavicular adenopathy no breast tenderness or nipple discharge. (exam performed in the presence of a chaperone)  LABORATORY DATA:  I have reviewed the data as listed CMP Latest Ref Rng & Units 09/04/2021 01/17/2021 01/10/2021  Glucose 70 - 99 mg/dL 90 97 108(H)  BUN 6 - 20 mg/dL _0 Creatinine 0.44 - 1.00 mg/dL 0.84 0.82 0.75  Sodium 135 - 145 mmol/L 138 139 139  Potassium 3.5 - 5.1 mmol/L 4.8 3.9 3.7  Chloride 98 - 111 mmol/L 107 109 108  CO2 22 - 32 mmol/L _1 Calcium 8.9 - 10.3 mg/dL 9.5 8.8(L) 9.1  Total Protein 6.5 - 8.1 g/dL 6.6 6.2(L) 6.2(L)  Total Bilirubin 0.3 - 1.2 mg/dL 0.4 0.4 0.3  Alkaline Phos  38 - 126 U/L 82 60 60  AST 15 - 41 U/L _2 ALT 0 - 44 U/L _3 Lab Results  Component Value Date   WBC 4.6 09/04/2021   HGB 13.2 09/04/2021   HCT 38.7 09/04/2021   MCV 88.6 09/04/2021   PLT 192 09/04/2021   NEUTROABS 2.9 09/04/2021    ASSESSMENT & PLAN:  Malignant neoplasm of upper-outer quadrant of left breast in female, estrogen receptor positive ([place]) 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  Neoadjuvant chemotherapy with dose dense Adriamycin and Cytoxan followed by Taxol weekly x12 completed 01/17/2021 2. 02/26/2021 breast conserving surgery with sentinel lymph node and targeted node dissection,: Path CR 0/4 LN Neg 04/18/21: Bilateral Mammoplasty: Benign 3.  Adjuvant radiation therapy 05/28/21- 07/10/21 4.  Followed by adjuvant antiestrogen therapy with tamoxifen--unable to tolerate, on Letrozole daily switched to exemestane ------------------------------------------------------------------------------------------------------------------------- Exemestane Toxicities: Patient has not started exemestane yet  PET CT 10/01/21: No evidence of metastatic disease postradiation changes in the lungs Patient has appointment to see pulmonary. Fluid was drained from the left breast recently  Return to clinic in 3 months to assess tolerance to exemestane.    No orders of the defined types were placed in this  encounter.  The patient has a good understanding of the overall plan. she agrees with it. she will call with any problems that may develop before the next visit here.  Total time spent: 20 mins including face to face time and time spent for planning, charting and coordination of care  Rulon Eisenmenger, MD, MPH 10/02/2021  I, Thana Ates, am acting as scribe for Dr. [name].  I have reviewed the above documentation for accuracy and completeness, and I agree with the above.

## 2021-10-02 ENCOUNTER — Inpatient Hospital Stay: Payer: BC Managed Care – PPO | Attending: Adult Health | Admitting: Hematology and Oncology

## 2021-10-02 DIAGNOSIS — Z170 Estrogen receptor positive status [ER+]: Secondary | ICD-10-CM

## 2021-10-02 DIAGNOSIS — C50412 Malignant neoplasm of upper-outer quadrant of left female breast: Secondary | ICD-10-CM | POA: Diagnosis not present

## 2021-10-06 ENCOUNTER — Encounter: Payer: Self-pay | Admitting: Pulmonary Disease

## 2021-10-06 ENCOUNTER — Encounter: Payer: BC Managed Care – PPO | Admitting: Adult Health

## 2021-10-06 ENCOUNTER — Ambulatory Visit: Payer: BC Managed Care – PPO | Admitting: Pulmonary Disease

## 2021-10-06 ENCOUNTER — Other Ambulatory Visit: Payer: Self-pay

## 2021-10-06 VITALS — BP 106/64 | HR 75 | Temp 98.0°F | Ht 66.0 in | Wt 204.4 lb

## 2021-10-06 DIAGNOSIS — R918 Other nonspecific abnormal finding of lung field: Secondary | ICD-10-CM

## 2021-10-06 DIAGNOSIS — Z8616 Personal history of COVID-19: Secondary | ICD-10-CM

## 2021-10-06 DIAGNOSIS — R911 Solitary pulmonary nodule: Secondary | ICD-10-CM

## 2021-10-06 NOTE — Progress Notes (Signed)
Synopsis: Referred in March 2023 for lung nodules, oncology Dr. Lindi Adie, PCP: By [name], MD  Subjective:   PATIENT ID: Tracy Dyer GENDER: female DOB: Feb 18, 1971, MRN: [id number]  Chief Complaint  Patient presents with   Consult    Patient is here to talk about lung nodule.     This is a 51 year old female, past medical history of breast cancer, family history of breast cancer, family history of lung cancer, family history of ovarian cancer, she had left breast cancer in October 2022 treated with surgery and radiation to the left breast and left axilla.  She had follow-up CT imaging of the chest which revealed groundglass nodularity within the left upper lobe.  She also had radiation changes to the left anterior chest wall.  Patient had subsequent pet imaging.  I reviewed patient's CT imaging as well as pet imaging that was completed on 10/01/2021.  Pet imaging revealed few areas of scattered groundglass within the left upper lobe patient was referred for review of imaging and discussed neck steps regarding nodularity in the left upper lobe.   Past Medical History:  Diagnosis Date   Anxiety    Cancer ([place])    breast   Depression    Family history of breast cancer    Family history of lung cancer    Family history of ovarian cancer    GERD (gastroesophageal reflux disease)    diet controlled   History of radiation therapy    Left breast, left axilla- 05/27/21-07/10/21- Dr. [name]   Seasonal allergies      Family History  Problem Relation Age of Onset   Eczema Father    Allergic rhinitis Brother    Breast cancer Mother        in 76's   Cancer Maternal Uncle        unk type   Lung cancer Maternal Grandfather 42   Heart Problems Paternal Grandfather    Melanoma Cousin    Ovarian cancer Paternal Great-grandmother    Cancer Paternal Great-grandmother        GYN cancer, possibly ovarian?   Cancer Maternal Aunt        half aunts/uncles x5- rare cancers     Past  Surgical History:  Procedure Laterality Date   BREAST BIOPSY Left 2019   x 2 biopsy   BREAST EXCISIONAL BIOPSY Left    BREAST LUMPECTOMY WITH RADIOACTIVE SEED AND SENTINEL LYMPH NODE BIOPSY Left 02/26/2021   Procedure: LEFT BREAST LUMPECTOMY WITH RADIOACTIVE SEED x2; LEFT AXILLARY SENTINEL NODE BIOPSY;  Surgeon: [name], MD;  Location: [place];  Service: General;  Laterality: Left;   BREAST LUMPECTOMY WITH RADIOFREQUENCY TAG IDENTIFICATION Left 02/01/7792   Procedure: LEFT BREAST MRI WIRE GUIDED EXCISION;  Surgeon: [name], MD;  Location: [place];  Service: General;  Laterality: Left;   BREAST REDUCTION SURGERY Bilateral 04/18/2021   Procedure: MAMMARY REDUCTION  (BREAST) LEFT ONCOPLASTIC BREAST RECONSTRUCTION, RIGHT BREAST REDUCTION;  Surgeon: Irene Limbo, MD;  Location: [place];  Service: Plastics;  Laterality: Bilateral;   CHOLECYSTECTOMY  2008   PORT-A-CATH REMOVAL Right 04/18/2021   Procedure: REMOVAL PORT-A-CATH;  Surgeon: [name], MD;  Location: [place];  Service: General;  Laterality: Right;   PORTACATH PLACEMENT N/A 09/05/2020   Procedure: INSERTION PORT-A-CATH;  Surgeon: [name], MD;  Location: [place];  Service: General;  Laterality: N/A;  [place] EXTRACTION      Social  History   Socioeconomic History   Marital status: Married    Spouse name: Not on file   Number of children: Not on file   Years of education: Not on file   Highest education level: Not on file  Occupational History   Not on file  Tobacco Use   Smoking status: Never   Smokeless tobacco: Never  Vaping Use   Vaping Use: Never used  Substance and Sexual Activity   Alcohol use: Not Currently   Drug use: Never   Sexual activity: Yes    Birth control/protection: None  Other Topics Concern   Not on file  Social History Narrative   Not on file   Social Determinants of Health    Financial Resource Strain: Not on file  Food Insecurity: Not on file  Transportation Needs: Not on file  Physical Activity: Not on file  Stress: Not on file  Social Connections: Not on file  Intimate Partner Violence: Not on file     Allergies  Allergen Reactions   Sulfa Antibiotics     Other reaction(s): Rash, urticarial   Sulfamethoxazole Rash    Feel sun burn     Outpatient Medications Prior to Visit  Medication Sig Dispense Refill   albuterol (VENTOLIN HFA) 108 (90 Base) MCG/ACT inhaler Inhale 1-2 puffs into the lungs every 4 (four) hours as needed for wheezing or shortness of breath. 8 g 2   calcium carbonate (TUMS - DOSED IN MG ELEMENTAL CALCIUM) 500 MG chewable tablet Chew 4 tablets by mouth as needed for indigestion or heartburn.     clonazePAM (KLONOPIN) 1 MG tablet TAKE 1 TABLET BY MOUTH TWICE A DAY AS NEEDED FOR ANXIETY 60 tablet 1   exemestane (AROMASIN) 25 MG tablet Take 1 tablet (25 mg total) by mouth daily after breakfast. 30 tablet 1   fluticasone (FLONASE) 50 MCG/ACT nasal spray Place 1 spray into both nostrils daily as needed for allergies.     l-methylfolate-B6-B12 (METANX) 3-35-2 MG TABS tablet Take 1 tablet by mouth daily.     LORazepam (ATIVAN) 0.5 MG tablet TAKE 1 TABLET BY MOUTH THREE TIMES A DAY AS NEEDED 40 tablet 0   MAGNESIUM CITRATE PO Take 405 mg by mouth daily. 135 mg     meloxicam (MOBIC) 7.5 MG tablet Take 1 tablet (7.5 mg total) by mouth daily as needed for pain. 14 tablet 0   Multiple Vitamins-Minerals (MULTIVITAMIN WITH MINERALS) tablet Take 1 tablet by mouth daily.     omeprazole (PRILOSEC) 20 MG capsule Take 1 capsule (20 mg total) by mouth daily. 30 capsule 3   oxyCODONE (ROXICODONE) 5 MG immediate release tablet Take 1 tablet (5 mg total) by mouth every 4 (four) hours as needed. 20 tablet 0   pseudoephedrine (SUDAFED) 30 MG tablet Take 30 mg by mouth one time in dialysis.     sertraline (ZOLOFT) 100 MG tablet Take 1 tablet (100 mg total) by  mouth at bedtime. 30 tablet 0   Turmeric (CURCUMIN 95 PO) Take by mouth.     vitamin B-12 (CYANOCOBALAMIN) 100 MCG tablet Take 100 mcg by mouth daily.     No facility-administered medications prior to visit.    ROS   Objective:  Physical Exam   Vitals:   10/06/21 0927  BP: 106/64  Pulse: 75  Temp: 98 F (36.7 C)  TempSrc: Oral  SpO2: 96%  Weight: 204 lb 6.4 oz (92.7 kg)  Height: '5\' 6"'$  (1.676 m)   96% on RA BMI  Readings from Last 3 Encounters:  10/06/21 32.99 kg/m  10/02/21 32.78 kg/m  09/18/21 32.62 kg/m   Wt Readings from Last 3 Encounters:  10/06/21 204 lb 6.4 oz (92.7 kg)  10/02/21 203 lb 1.6 oz (92.1 kg)  09/18/21 202 lb 1.6 oz (91.7 kg)     CBC    Component Value Date/Time   WBC 4.6 09/04/2021 1154   WBC 3.2 (L) 01/17/2021 1015   RBC 4.37 09/04/2021 1154   HGB 13.2 09/04/2021 1154   HCT 38.7 09/04/2021 1154   PLT 192 09/04/2021 1154   MCV 88.6 09/04/2021 1154   MCH 30.2 09/04/2021 1154   MCHC 34.1 09/04/2021 1154   RDW 14.3 09/04/2021 1154   LYMPHSABS 1.0 09/04/2021 1154   MONOABS 0.5 09/04/2021 1154   EOSABS 0.2 09/04/2021 1154   BASOSABS 0.1 09/04/2021 1154     Chest Imaging: 09/16/2021: Left upper lobe groundglass nodule, radiation changes to the left anterior chest. The patient's images have been independently reviewed by me.    10/01/2021 nuclear medicine pet imaging: No hypermetabolic uptake within the left upper lobe groundglass lesion. The patient's images have been independently reviewed by me.    Pulmonary Functions Testing Results: No flowsheet data found.  FeNO:   Pathology:   Echocardiogram:   Heart Catheterization:     Assessment & Plan:     ICD-10-CM   1. Lung nodule  R91.1 CT Super D Chest W Wo Contrast    2. Ground glass opacity present on imaging of lung  R91.8 CT Super D Chest W Wo Contrast    3. History of COVID-19  Z86.16       Discussion:  This is a 51 year old female, past medical history of breast  cancer in October 2022.  Repeat CT imaging of the chest abdomen pelvis revealed groundglass nodularity within the left upper lobe.  Patient had subsequent PET scan imaging which showed no significant PET uptake however lesion was still persistent.  Of note she had COVID-19 in December of this past year.  Approximately 6 weeks prior to her initial CT imaging.  These findings could represent her history of COVID-19.  Plan: I think she needs a repeat noncontrasted CT scan of the chest in 3 months. We will see at that time whether or not the groundglass nodular changes are still present. Hopefully these will have resolved on their own and are likely related to inflammation. We talked about this in detail today. If the groundglass lesion stay persistent or there is any change to a more subsolid component then I think we will need to proceed with considerations for tissue biopsy. Patient is agreeable to this plan.  We appreciate the referral.   Current Outpatient Medications:    albuterol (VENTOLIN HFA) 108 (90 Base) MCG/ACT inhaler, Inhale 1-2 puffs into the lungs every 4 (four) hours as needed for wheezing or shortness of breath., Disp: 8 g, Rfl: 2   calcium carbonate (TUMS - DOSED IN MG ELEMENTAL CALCIUM) 500 MG chewable tablet, Chew 4 tablets by mouth as needed for indigestion or heartburn., Disp: , Rfl:    clonazePAM (KLONOPIN) 1 MG tablet, TAKE 1 TABLET BY MOUTH TWICE A DAY AS NEEDED FOR ANXIETY, Disp: 60 tablet, Rfl: 1   exemestane (AROMASIN) 25 MG tablet, Take 1 tablet (25 mg total) by mouth daily after breakfast., Disp: 30 tablet, Rfl: 1   fluticasone (FLONASE) 50 MCG/ACT nasal spray, Place 1 spray into both nostrils daily as needed for allergies., Disp: , Rfl:  l-methylfolate-B6-B12 (METANX) 3-35-2 MG TABS tablet, Take 1 tablet by mouth daily., Disp: , Rfl:    LORazepam (ATIVAN) 0.5 MG tablet, TAKE 1 TABLET BY MOUTH THREE TIMES A DAY AS NEEDED, Disp: 40 tablet, Rfl: 0   MAGNESIUM CITRATE  PO, Take 405 mg by mouth daily. 135 mg, Disp: , Rfl:    meloxicam (MOBIC) 7.5 MG tablet, Take 1 tablet (7.5 mg total) by mouth daily as needed for pain., Disp: 14 tablet, Rfl: 0   Multiple Vitamins-Minerals (MULTIVITAMIN WITH MINERALS) tablet, Take 1 tablet by mouth daily., Disp: , Rfl:    omeprazole (PRILOSEC) 20 MG capsule, Take 1 capsule (20 mg total) by mouth daily., Disp: 30 capsule, Rfl: 3   oxyCODONE (ROXICODONE) 5 MG immediate release tablet, Take 1 tablet (5 mg total) by mouth every 4 (four) hours as needed., Disp: 20 tablet, Rfl: 0   pseudoephedrine (SUDAFED) 30 MG tablet, Take 30 mg by mouth one time in dialysis., Disp: , Rfl:    sertraline (ZOLOFT) 100 MG tablet, Take 1 tablet (100 mg total) by mouth at bedtime., Disp: 30 tablet, Rfl: 0   Turmeric (CURCUMIN 95 PO), Take by mouth., Disp: , Rfl:    vitamin B-12 (CYANOCOBALAMIN) 100 MCG tablet, Take 100 mcg by mouth daily., Disp: , Rfl:   I spent 62 minutes dedicated to the care of this patient on the date of this encounter to include pre-visit review of records, face-to-face time with the patient discussing conditions above, post visit ordering of testing, clinical documentation with the electronic health record, making appropriate referrals as documented, and communicating necessary findings to members of the patients care team.   Garner Nash, DO [facility] 10/06/2021 9:33 AM

## 2021-10-06 NOTE — Patient Instructions (Signed)
Thank you for visiting Dr. Valeta Harms at [facility]. ?Today we recommend the following: ? ?Orders Placed This Encounter  ?Procedures  ? CT Super D Chest W Wo Contrast  ? ?Please schedule follow up appt with [place] after CT chest.  ? ?Return in about 3 months (around 01/06/2022) for w/ Dr. Valeta Harms . ? ? ? ?Please do your part to reduce the spread of COVID-19.  ? ?

## 2021-10-07 ENCOUNTER — Ambulatory Visit: Payer: BC Managed Care – PPO | Attending: Adult Health

## 2021-10-07 DIAGNOSIS — Z170 Estrogen receptor positive status [ER+]: Secondary | ICD-10-CM | POA: Insufficient documentation

## 2021-10-07 DIAGNOSIS — C50412 Malignant neoplasm of upper-outer quadrant of left female breast: Secondary | ICD-10-CM | POA: Diagnosis not present

## 2021-10-07 DIAGNOSIS — R600 Localized edema: Secondary | ICD-10-CM | POA: Diagnosis not present

## 2021-10-07 DIAGNOSIS — R293 Abnormal posture: Secondary | ICD-10-CM | POA: Diagnosis not present

## 2021-10-07 NOTE — Therapy (Signed)
?OUTPATIENT PHYSICAL THERAPY TREATMENT NOTE ? ? ?Patient Name: Tracy Dyer ?MRN: [id number] ?DOB:[birth year], 51 y.o., female ?Today's Date: 10/07/2021 ? ?PCP: [name], MD ?REFERRING PROVIDER: [name], MD ? ? PT End of Session - 10/07/21 0854   ? ? Visit Number 6   ? Number of Visits 12   ? Date for PT Re-Evaluation 10/23/21   ? PT Start Time 0815   ? PT Stop Time [phone]   ? PT Time Calculation (min) 37 min   ? Activity Tolerance Patient tolerated treatment well   ? Behavior During Therapy [facility] for tasks assessed/performed   ? ?  ?  ? ?  ? ? ? ? ?Past Medical History:  ?Diagnosis Date  ? Anxiety   ? Cancer [facility])   ? breast  ? Depression   ? Family history of breast cancer   ? Family history of lung cancer   ? Family history of ovarian cancer   ? GERD (gastroesophageal reflux disease)   ? diet controlled  ? History of radiation therapy   ? Left breast, left axilla- 05/27/21-07/10/21- Dr. [name]  ? Seasonal allergies   ? ?Past Surgical History:  ?Procedure Laterality Date  ? BREAST BIOPSY Left 2019  ? x 2 biopsy  ? BREAST EXCISIONAL BIOPSY Left   ? BREAST LUMPECTOMY WITH RADIOACTIVE SEED AND SENTINEL LYMPH NODE BIOPSY Left 02/26/2021  ? Procedure: LEFT BREAST LUMPECTOMY WITH RADIOACTIVE SEED x2; LEFT AXILLARY SENTINEL NODE BIOPSY;  Surgeon: [name], MD;  Location: [place];  Service: General;  Laterality: Left;  ? BREAST LUMPECTOMY WITH RADIOFREQUENCY TAG IDENTIFICATION Left 5/36/1443  ? Procedure: LEFT BREAST MRI WIRE GUIDED EXCISION;  Surgeon: [name], MD;  Location: [place];  Service: General;  Laterality: Left;  ? BREAST REDUCTION SURGERY Bilateral 04/18/2021  ? Procedure: MAMMARY REDUCTION  (BREAST) LEFT ONCOPLASTIC BREAST RECONSTRUCTION, RIGHT BREAST REDUCTION;  Surgeon: Irene Limbo, MD;  Location: [place];  Service: Plastics;  Laterality: Bilateral;  ? CHOLECYSTECTOMY  2008  ? PORT-A-CATH REMOVAL Right 04/18/2021  ?  Procedure: REMOVAL PORT-A-CATH;  Surgeon: [name], MD;  Location: [place];  Service: General;  Laterality: Right;  ? PORTACATH PLACEMENT N/A 09/05/2020  ? Procedure: INSERTION PORT-A-CATH;  Surgeon: [name], MD;  Location: [place];  Service: General;  Laterality: N/A;  PEC BLOCK  ? WISDOM TOOTH EXTRACTION    ? ?Patient Active Problem List  ? Diagnosis Date Noted  ? Occipital neuralgia of right side 07/01/2021  ? Peripheral vertigo 07/01/2021  ? Port-A-Cath in place 09/20/2020  ? Genetic testing 09/04/2020  ? Family history of breast cancer   ? Family history of ovarian cancer   ? Family history of lung cancer   ? Malignant neoplasm of upper-outer quadrant of left breast in female, estrogen receptor positive ([place]) 08/21/2020  ? Anxiety 11/16/2013  ? Hypercoagulable state ([place]) 11/16/2013  ? TIA (transient ischemic attack) 11/15/2013  ? Chest discomfort 01/20/2012  ? Factor V Leiden ([place]) 01/20/2012  ? MVP (mitral valve prolapse) 01/20/2012  ? Acute bronchitis 12/23/2011  ? ? ?REFERRING DIAG: Left breast swelling and axillary tightness ? ?THERAPY DIAG:  ?Malignant neoplasm of upper-outer quadrant of left breast in female, estrogen receptor positive ([place]) ? ?Localized edema ? ?Abnormal posture ? ?Pain in Rt foot ? ?PERTINENT HISTORY: Lt lumpectomy with SLNB 0/4 LN on 02/26/21, reduction and re-excision on 04/18/21 with chemotherapy and radiation completed  ? ?PRECAUTIONS: Rt shoulder RTC, lymphedema Lt risk,  TIA ? ?SUBJECTIVE:  I had a massage yesterday and I feel like it irritated the seroma area where it was drained last week.  Not sure if its from laying on it or where he stretched me. My upper arm feels swollen too.  Cording seems to be better.  Pt 14 min late ?PAIN:  ?Are you having pain? Yes ?NPRS scale: 4/10 ?Pain location:  left breast ?Pain orientation: left ?PAIN TYPE: aching, sore, swollen ?Pain description: constant and dull  ?Aggravating factors: reaching, just at rest  feels it. ?Relieving factors: rest ? ?Lymphedema measurements: 10 cm prx olecranon 34.1 ?                          15 cm proximal to Olec 36.1  ( cm larger than right) ?TODAY'S TREATMENT 10/07/2021 ?Remeasured pts upper arm with only  cm difference at 10 cm proximal to olecranon.  Initiated MLD to left breast and MLD while instructing pt in steps. Short neck, 5 breaths, activated bilateral axillary LN's and left inguinal LN, anterior interaxillary pathway and left axillo inguinal pathway, then left breast retracing steps and left upper arm lateral, then medial to lateral and lateral arm retracing steps and ending with LN's. Pt practiced all techniques briefly and was given a handout. ? ?09/26/21 ?TE:   ?Pulleys 14mn flexion and abduction ?Wall ball flexion 6" x 6 with cording pull noted  ?Doorway stretch 2x20"   ?MT: ?Soft tissue mobilization modified to avoid breast with infection: focused on MFR to left axillary region and upper arm during stretching with cording release ?PROM left shoulder flexion, scaption, Abd, ER ? ? ?09/22/21 ?TE:   ?Pulleys 347m flexion and abduction with initial cueing giving pt info on self ordering ?Wall ball flexion 6" x 6 with cording pull noted  ?Wall ball abduction left 5" x 5 with initial performance cueing  ?Doorway stretch 2x20"   ?MT: ?Soft tissue mobilization to Left UT, left pectorals and lats, and in right SL left lats and scapular area and left posterior shoulder. ?MFR to left axillary region and upper arm during stretching with cording release ?PROM left shoulder flexion, scaption, Abd, ER ? ?  ?HOME EXERCISE PROGRAM: ?Abd wall slides, counter lat stretch, stargazer in supine on left, self left breast MLD ?Access Code: [email or web address] ?  ?ASSESSMENT:Pt had seroma drained last week and was doing very well until a massage yesterday and irritation of the left breast with feelings of swelling there and in left upper arm. No fibrosis or new seroma was noted. Performed left breast MLD and  instructed pt in same.  She felt much better after rx. She did very well with techniques for a rather brief introduction and practice since pt was late. She was given a handout and will practice at home. ?  ?  ?GOALS: ?Goals reviewed with patient? Yes ?  ?SHORT TERM GOALS: ?  ?STG Name Target Date Goal status  ?1 Pt will be independent and compliant with HEP ?Baseline:  10/02/2021 INITIAL  ?LONG TERM GOALS:  ?  ?LTG Name Target Date Goal status  ?1 Pt will have decreased left axillary/breast pain by 50% or greater ?Baseline: 10/23/2021 INITIAL  ?2 Pt will have improved left shoulder abduction to 160 degrees  ?Baseline:   ? 10/23/2021 INITIAL  ?3 Pt will no longer complain of tightness with cording at end ranges ?Baseline: 10/23/2021 INITIAL  ?4 Pt will attend ABC class ?Baseline: 10/23/2021 INITIAL  ? PLAN: ?  PT FREQUENCY: 2x/week ?  ?PT DURATION: 6 weeks ?  ?PLANNED INTERVENTIONS: Therapeutic exercises, Neuro Muscular re-education, Patient/Family education, Orthotic/Fit training, Dry Needling, scar mobilization, and Manual therapy, aquatic therapy ?  ?PLAN FOR NEXT SESSION: Review breast MLD/upper arm prn , MFR to axillary cording ?  ? ?Access Code: [email or web address]: [email or web address]: 02/13/2023Prepared by: Marcene Brawn TevisExercises  ?Supine Lower Trunk Rotation - 1 x daily - 7 x weekly - 1 sets - 10 reps - 3 second hold  ?Supine Hamstring Stretch with Strap - 1 x daily - 7 x weekly - 1-3 sets - 3 reps - 20-30 seconds hold  ?Supine Sciatic Nerve Glide - 1 x daily - 7 x weekly - 1 sets - 10 reps - 3 second hold ? ? ? ? ?Claris Pong, PT ?10/07/2021, 8:58 AM ? ?  ? ?

## 2021-10-07 NOTE — Patient Instructions (Signed)
Left breast MLD ?

## 2021-10-12 ENCOUNTER — Other Ambulatory Visit: Payer: Self-pay | Admitting: Hematology and Oncology

## 2021-10-12 ENCOUNTER — Other Ambulatory Visit: Payer: Self-pay | Admitting: Adult Health

## 2021-10-14 ENCOUNTER — Ambulatory Visit: Payer: BC Managed Care – PPO

## 2021-10-14 ENCOUNTER — Other Ambulatory Visit: Payer: Self-pay

## 2021-10-14 DIAGNOSIS — R293 Abnormal posture: Secondary | ICD-10-CM | POA: Diagnosis not present

## 2021-10-14 DIAGNOSIS — R600 Localized edema: Secondary | ICD-10-CM | POA: Diagnosis not present

## 2021-10-14 DIAGNOSIS — Z170 Estrogen receptor positive status [ER+]: Secondary | ICD-10-CM | POA: Diagnosis not present

## 2021-10-14 DIAGNOSIS — C50412 Malignant neoplasm of upper-outer quadrant of left female breast: Secondary | ICD-10-CM

## 2021-10-14 NOTE — Therapy (Signed)
?OUTPATIENT PHYSICAL THERAPY TREATMENT NOTE ? ? ?Patient Name: Tracy Dyer ?MRN: [id number] ?DOB:1970-11-18, 51 y.o., female ?Today's Date: 10/14/2021 ? ?PCP: [name], MD ?REFERRING PROVIDER: Deanne Coffer* ? ? PT End of Session - 10/14/21 0810   ? ? Visit Number 7   ? Number of Visits 12   ? Date for PT Re-Evaluation 10/23/21   ? PT Start Time [phone]   pt late  ? PT Stop Time 0902   ? PT Time Calculation (min) 51 min   ? Activity Tolerance Patient tolerated treatment well   ? Behavior During Therapy [facility] for tasks assessed/performed   ? ?  ?  ? ?  ? ? ? ? ?Past Medical History:  ?Diagnosis Date  ? Anxiety   ? [facility])   ? breast  ? Depression   ? Family history of breast cancer   ? Family history of lung cancer   ? Family history of ovarian cancer   ? GERD (gastroesophageal reflux disease)   ? diet controlled  ? History of radiation therapy   ? Left breast, left axilla- 05/27/21-07/10/21- Dr. [name]  ? Seasonal allergies   ? ?Past Surgical History:  ?Procedure Laterality Date  ? BREAST BIOPSY Left 2019  ? x 2 biopsy  ? BREAST EXCISIONAL BIOPSY Left   ? BREAST LUMPECTOMY WITH RADIOACTIVE SEED AND SENTINEL LYMPH NODE BIOPSY Left 02/26/2021  ? Procedure: LEFT BREAST LUMPECTOMY WITH RADIOACTIVE SEED x2; LEFT AXILLARY SENTINEL NODE BIOPSY;  Surgeon: [name], MD;  Location: [place];  Service: General;  Laterality: Left;  ? BREAST LUMPECTOMY WITH RADIOFREQUENCY TAG IDENTIFICATION Left 7/41/2878  ? Procedure: LEFT BREAST MRI WIRE GUIDED EXCISION;  Surgeon: [name], MD;  Location: [place];  Service: General;  Laterality: Left;  ? BREAST REDUCTION SURGERY Bilateral 04/18/2021  ? Procedure: MAMMARY REDUCTION  (BREAST) LEFT ONCOPLASTIC BREAST RECONSTRUCTION, RIGHT BREAST REDUCTION;  Surgeon: Irene Limbo, MD;  Location: [place];  Service: Plastics;  Laterality: Bilateral;  ? CHOLECYSTECTOMY  2008  ? PORT-A-CATH REMOVAL Right  04/18/2021  ? Procedure: REMOVAL PORT-A-CATH;  Surgeon: [name], MD;  Location: [place];  Service: General;  Laterality: Right;  ? PORTACATH PLACEMENT N/A 09/05/2020  ? Procedure: INSERTION PORT-A-CATH;  Surgeon: [name], MD;  Location: [place];  Service: General;  Laterality: N/A;  PEC BLOCK  ? WISDOM TOOTH EXTRACTION    ? ?Patient Active Problem List  ? Diagnosis Date Noted  ? Occipital neuralgia of right side 07/01/2021  ? Peripheral vertigo 07/01/2021  ? Port-A-Cath in place 09/20/2020  ? Genetic testing 09/04/2020  ? Family history of breast cancer   ? Family history of ovarian cancer   ? Family history of lung cancer   ? Malignant neoplasm of upper-outer quadrant of left breast in female, estrogen receptor positive ([place]) 08/21/2020  ? Anxiety 11/16/2013  ? Hypercoagulable state ([place]) 11/16/2013  ? TIA (transient ischemic attack) 11/15/2013  ? Chest discomfort 01/20/2012  ? Factor V Leiden ([place]) 01/20/2012  ? MVP (mitral valve prolapse) 01/20/2012  ? Acute bronchitis 12/23/2011  ? ? ?REFERRING DIAG: Left breast swelling and axillary tightness ? ?THERAPY DIAG:  ?Malignant neoplasm of upper-outer quadrant of left breast in female, estrogen receptor positive ([place]) ? ?Localized edema ? ?Abnormal posture ? ?Pain in Rt foot ? ?PERTINENT HISTORY: Lt lumpectomy with SLNB 0/4 LN on 02/26/21, reduction and re-excision on 04/18/21 with chemotherapy and radiation completed  ? ?PRECAUTIONS: Rt shoulder RTC,  lymphedema Lt risk, TIA ? ?SUBJECTIVE: The treatment last time really helped.  It was so subtle but I got some lasting relief. I didn't try any of the MLD because I have been so tired. The left breast still seems really tight. Cording seems better.  The breast doesn't really seem swollen. The arm always feels tight in the upper arm. ?Are you having pain? Yes ?NPRS scale: 3/10 ?Pain location:  left axillary region ?Pain orientation: left ?PAIN TYPE: aching, sore, ?Pain description:  intermittent, achy ?Aggravating factors: reaching, laying on left side ?Relieving factors: rest ? ? ?10/07/2021 Lymphedema measurements: 10 cm prx olecranon 34.1 ?                          15 cm proximal to Olec 36.1  ( cm larger than right) ?TODAY'S TREATMENT  ?10/14/2021 ?Pulleys flexion and abduction 2 min each to warm up, VC's not to overstretch ?MANUAL: soft tissue mobilization to left UT, pectorals and lats. PROM left shoulder flexion, abduction, ER ?MLD ?Short neck, 5 breaths, activated bilateral axillary LN's and left inguinal LN, anterior interaxillary pathway and left axillo inguinal pathway, then left breast retracing steps and left upper arm lateral, then medial to lateral and lateral arm retracing steps and ending with LN's. ? ?10/07/2021 ?Remeasured pts upper arm with only  cm difference at 10 cm proximal to olecranon.  Initiated MLD to left breast and MLD while instructing pt in steps. Short neck, 5 breaths, activated bilateral axillary LN's and left inguinal LN, anterior interaxillary pathway and left axillo inguinal pathway, then left breast retracing steps and left upper arm lateral, then medial to lateral and lateral arm retracing steps and ending with LN's. Pt practiced all techniques briefly and was given a handout. ? ? ?09/26/21 ?TE:   ?Pulleys 53mn flexion and abduction ?Wall ball flexion 6" x 6 with cording pull noted  ?Doorway stretch 2x20"   ?MT: ?Soft tissue mobilization modified to avoid breast with infection: focused on MFR to left axillary region and upper arm during stretching with cording release ?PROM left shoulder flexion, scaption, Abd, ER ? ? ?  ?HOME EXERCISE PROGRAM: ?Abd wall slides, counter lat stretch, stargazer in supine on left, self left breast MLD ?Access Code: [email or web address] ?  ?ASSESSMENT:Pt got excellent relief from MLD last session.  Continued AAROM, soft tissue mobilization, PROM and MLD.  Pt was exceptionally tight in the left clavicular and axillary border of the pectoralis  today and ROM was improved at end of session but still felt tight in the Lats and left lateral breast with stretching. No noticeable cording today. Pt felt better after MLD ?  ?GOALS: ?Goals reviewed with patient? Yes ?  ?SHORT TERM GOALS: ?  ?STG Name Target Date Goal status  ?1 Pt will be independent and compliant with HEP ?Baseline:  10/02/2021 INITIAL  ?LONG TERM GOALS:  ?  ?LTG Name Target Date Goal status  ?1 Pt will have decreased left axillary/breast pain by 50% or greater ?Baseline: 10/23/2021 INITIAL  ?2 Pt will have improved left shoulder abduction to 160 degrees  ?Baseline:   ? 10/23/2021 INITIAL  ?3 Pt will no longer complain of tightness with cording at end ranges ?Baseline: 10/23/2021 INITIAL  ?4 Pt will attend ABC class ?Baseline: 10/23/2021 INITIAL  ? PLAN: ?PT FREQUENCY: 2x/week ?  ?PT DURATION: 6 weeks ?  ?PLANNED INTERVENTIONS: Therapeutic exercises, Neuro Muscular re-education, Patient/Family education, Orthotic/Fit training, Dry Needling, scar mobilization, and Manual therapy,  aquatic therapy ?  ?PLAN FOR NEXT SESSION: Review breast MLD/upper arm prn , Soft tissue mobilization to pectorals, Lats, Pec stretches,MFR to axillary cording prn ?  ? ?Access Code: [email or web address]: [email or web address]: 02/13/2023Prepared by: Marcene Brawn TevisExercises  ?Supine Lower Trunk Rotation - 1 x daily - 7 x weekly - 1 sets - 10 reps - 3 second hold  ?Supine Hamstring Stretch with Strap - 1 x daily - 7 x weekly - 1-3 sets - 3 reps - 20-30 seconds hold  ?Supine Sciatic Nerve Glide - 1 x daily - 7 x weekly - 1 sets - 10 reps - 3 second hold ? ? ? ? ?Claris Pong, PT ?10/14/2021, 9:05 AM ? ?  ? ?

## 2021-10-16 ENCOUNTER — Encounter: Payer: BC Managed Care – PPO | Admitting: Rehabilitation

## 2021-10-17 ENCOUNTER — Encounter: Payer: Self-pay | Admitting: Physical Therapy

## 2021-10-17 ENCOUNTER — Other Ambulatory Visit: Payer: Self-pay

## 2021-10-17 ENCOUNTER — Ambulatory Visit: Payer: BC Managed Care – PPO | Admitting: Physical Therapy

## 2021-10-17 DIAGNOSIS — R293 Abnormal posture: Secondary | ICD-10-CM | POA: Diagnosis not present

## 2021-10-17 DIAGNOSIS — R600 Localized edema: Secondary | ICD-10-CM

## 2021-10-17 DIAGNOSIS — Z170 Estrogen receptor positive status [ER+]: Secondary | ICD-10-CM | POA: Diagnosis not present

## 2021-10-17 DIAGNOSIS — C50412 Malignant neoplasm of upper-outer quadrant of left female breast: Secondary | ICD-10-CM

## 2021-10-17 NOTE — Therapy (Signed)
?OUTPATIENT PHYSICAL THERAPY TREATMENT NOTE ? ? ?Patient Name: Tracy Dyer ?MRN: [id number] ?DOB:1971-03-31, 51 y.o., female ?Today's Date: 10/17/2021 ? ?PCP: [name], MD ?REFERRING PROVIDER: Deanne Coffer* ? ? PT End of Session - 10/17/21 1438   ? ? Visit Number 8   ? Number of Visits 12   ? Date for PT Re-Evaluation 10/23/21   ? PT Start Time 1438   Pt late  ? PT Stop Time 7062   ? PT Time Calculation (min) 38 min   ? Activity Tolerance Patient tolerated treatment well   ? Behavior During Therapy [facility] for tasks assessed/performed   ? ?  ?  ? ?  ? ? ? ? ? ?Past Medical History:  ?Diagnosis Date  ? Anxiety   ? Cancer [facility])   ? breast  ? Depression   ? Family history of breast cancer   ? Family history of lung cancer   ? Family history of ovarian cancer   ? GERD (gastroesophageal reflux disease)   ? diet controlled  ? History of radiation therapy   ? Left breast, left axilla- 05/27/21-07/10/21- Dr. [name]  ? Seasonal allergies   ? ?Past Surgical History:  ?Procedure Laterality Date  ? BREAST BIOPSY Left 2019  ? x 2 biopsy  ? BREAST EXCISIONAL BIOPSY Left   ? BREAST LUMPECTOMY WITH RADIOACTIVE SEED AND SENTINEL LYMPH NODE BIOPSY Left 02/26/2021  ? Procedure: LEFT BREAST LUMPECTOMY WITH RADIOACTIVE SEED x2; LEFT AXILLARY SENTINEL NODE BIOPSY;  Surgeon: [name], MD;  Location: [place];  Service: General;  Laterality: Left;  ? BREAST LUMPECTOMY WITH RADIOFREQUENCY TAG IDENTIFICATION Left 3/76/2831  ? Procedure: LEFT BREAST MRI WIRE GUIDED EXCISION;  Surgeon: [name], MD;  Location: [place];  Service: General;  Laterality: Left;  ? BREAST REDUCTION SURGERY Bilateral 04/18/2021  ? Procedure: MAMMARY REDUCTION  (BREAST) LEFT ONCOPLASTIC BREAST RECONSTRUCTION, RIGHT BREAST REDUCTION;  Surgeon: Irene Limbo, MD;  Location: [place];  Service: Plastics;  Laterality: Bilateral;  ? CHOLECYSTECTOMY  2008  ? PORT-A-CATH REMOVAL Right  04/18/2021  ? Procedure: REMOVAL PORT-A-CATH;  Surgeon: [name], MD;  Location: [place];  Service: General;  Laterality: Right;  ? PORTACATH PLACEMENT N/A 09/05/2020  ? Procedure: INSERTION PORT-A-CATH;  Surgeon: [name], MD;  Location: [place];  Service: General;  Laterality: N/A;  PEC BLOCK  ? WISDOM TOOTH EXTRACTION    ? ?Patient Active Problem List  ? Diagnosis Date Noted  ? Occipital neuralgia of right side 07/01/2021  ? Peripheral vertigo 07/01/2021  ? Port-A-Cath in place 09/20/2020  ? Genetic testing 09/04/2020  ? Family history of breast cancer   ? Family history of ovarian cancer   ? Family history of lung cancer   ? Malignant neoplasm of upper-outer quadrant of left breast in female, estrogen receptor positive ([place]) 08/21/2020  ? Anxiety 11/16/2013  ? Hypercoagulable state ([place]) 11/16/2013  ? TIA (transient ischemic attack) 11/15/2013  ? Chest discomfort 01/20/2012  ? Factor V Leiden ([place]) 01/20/2012  ? MVP (mitral valve prolapse) 01/20/2012  ? Acute bronchitis 12/23/2011  ? ? ?REFERRING DIAG: Left breast swelling and axillary tightness ? ?THERAPY DIAG:  ?Malignant neoplasm of upper-outer quadrant of left breast in female, estrogen receptor positive ([place]) ? ?Localized edema ? ?Abnormal posture ? ?Pain in Rt foot ? ?PERTINENT HISTORY: Lt lumpectomy with SLNB 0/4 LN on 02/26/21, reduction and re-excision on 04/18/21 with chemotherapy and radiation completed  ? ?PRECAUTIONS: Rt shoulder  RTC, lymphedema Lt risk, TIA ? ?SUBJECTIVE: RT sciatica acting up today. Lt shoulder feeling better, crackles a bit.  ?Are you having pain? Yes ?NPRS scale: 5/10 ?Pain location:  RT leg to foot ?Pain orientation: left ?PAIN TYPE: aching, sore, ?Pain description: intermittent, achy ?Aggravating factors: Not sure ?Relieving factors: rest ? ? ?10/07/2021 Lymphedema measurements: 10 cm prx olecranon 34.1 ?                          15 cm proximal to Olec 36.1  ( cm larger than right) ?TODAY'S  TREATMENT  ? ?10/17/21: ?Pt arrives for aquatic physical therapy. Treatment took place in 3.5-5.5 feet of water. Water temperature was.92degrees F Pt entered the pool via stairs with light use of rails. Pt requires buoyancy of water for support and to offload joints with strengthening exercises.  ?Seated water bench with 75% submersion ?Pt performed seated LE AROM exercises 20x in all planes to acclimate to water and discuss current status. ?80% depth standing against wall with mitten hands: shld flex/ext 2x10 1 min rest break in between ?Side stepping with shld abd/add: 4 lengths then rest UE, repeat 4 lengths ?Standing 90 degree angle stretch 3 breaths then rest 3x ?Trunk rotation with blue noodle 1 min 2x ? ?10/14/2021 ?Pulleys flexion and abduction 2 min each to warm up, VC's not to overstretch ?MANUAL: soft tissue mobilization to left UT, pectorals and lats. PROM left shoulder flexion, abduction, ER ?MLD ?Short neck, 5 breaths, activated bilateral axillary LN's and left inguinal LN, anterior interaxillary pathway and left axillo inguinal pathway, then left breast retracing steps and left upper arm lateral, then medial to lateral and lateral arm retracing steps and ending with LN's. ? ?10/07/2021 ?Remeasured pts upper arm with only  cm difference at 10 cm proximal to olecranon.  Initiated MLD to left breast and MLD while instructing pt in steps. Short neck, 5 breaths, activated bilateral axillary LN's and left inguinal LN, anterior interaxillary pathway and left axillo inguinal pathway, then left breast retracing steps and left upper arm lateral, then medial to lateral and lateral arm retracing steps and ending with LN's. Pt practiced all techniques briefly and was given a handout. ? ? ?09/26/21 ?TE:   ?Pulleys 49mn flexion and abduction ?Wall ball flexion 6" x 6 with cording pull noted  ?Doorway stretch 2x20"   ?MT: ?Soft tissue mobilization modified to avoid breast with infection: focused on MFR to left axillary  region and upper arm during stretching with cording release ?PROM left shoulder flexion, scaption, Abd, ER ? ? ?  ?HOME EXERCISE PROGRAM: ?Abd wall slides, counter lat stretch, stargazer in supine on left, self left breast MLD ?Access Code: [email or web address] ?  ?ASSESSMENT: Pt arrives for first aquatic PT visit today with a RTLE flared up sciatica. PTA educated pt in water principles and how we we be using them. Pt verbally understood principles. Pt felt good relief of LE symptoms as we moved in the water even when participating in UE exercises. No pain or over stretching to LTUE. Pt enjoyed exercising in the water. ?  ?GOALS: ?Goals reviewed with patient? Yes ?  ?SHORT TERM GOALS: ?  ?STG Name Target Date Goal status  ?1 Pt will be independent and compliant with HEP ?Baseline:  10/02/2021 INITIAL  ?LONG TERM GOALS:  ?  ?LTG Name Target Date Goal status  ?1 Pt will have decreased left axillary/breast pain by 50% or greater ?Baseline: 10/23/2021 INITIAL  ?2  Pt will have improved left shoulder abduction to 160 degrees  ?Baseline:   ? 10/23/2021 INITIAL  ?3 Pt will no longer complain of tightness with cording at end ranges ?Baseline: 10/23/2021 INITIAL  ?4 Pt will attend ABC class ?Baseline: 10/23/2021 INITIAL  ? PLAN: ?PT FREQUENCY: 2x/week ?  ?PT DURATION: 6 weeks ?  ?PLANNED INTERVENTIONS: Therapeutic exercises, Neuro Muscular re-education, Patient/Family education, Orthotic/Fit training, Dry Needling, scar mobilization, and Manual therapy, aquatic therapy ?  ?PLAN FOR NEXT SESSION: Review breast MLD/upper arm prn , Soft tissue mobilization to pectorals, Lats, Pec stretches,MFR to axillary cording prn ?  ? ?Access Code: [email or web address]: [email or web address]: 02/13/2023Prepared by: Marcene Brawn TevisExercises  ?Supine Lower Trunk Rotation - 1 x daily - 7 x weekly - 1 sets - 10 reps - 3 second hold  ?Supine Hamstring Stretch with Strap - 1 x daily - 7 x weekly - 1-3 sets - 3 reps - 20-30 seconds hold  ?Supine Sciatic Nerve  Glide - 1 x daily - 7 x weekly - 1 sets - 10 reps - 3 second hold ? ? ? ? ?Arnesia Vincelette, PTA ?10/17/2021, 4:34 PM ? ?  ? ?

## 2021-10-22 ENCOUNTER — Telehealth: Payer: Self-pay | Admitting: *Deleted

## 2021-10-27 ENCOUNTER — Telehealth: Payer: Self-pay | Admitting: *Deleted

## 2021-10-30 NOTE — Therapy (Deleted)
?OUTPATIENT PHYSICAL THERAPY TREATMENT NOTE ? ? ?Patient Name: Tracy Dyer ?MRN: [id number] ?DOB:1971-08-02, 51 y.o., female ?Today's Date: 10/30/2021 ? ?PCP: [name], MD ?REFERRING PROVIDER: Deanne Coffer* ? ? ? ? ? ? ? ?Past Medical History:  ?Diagnosis Date  ? Anxiety   ? Cancer [facility])   ? breast  ? Depression   ? Family history of breast cancer   ? Family history of lung cancer   ? Family history of ovarian cancer   ? GERD (gastroesophageal reflux disease)   ? diet controlled  ? History of radiation therapy   ? Left breast, left axilla- 05/27/21-07/10/21- Dr. [name]  ? Seasonal allergies   ? ?Past Surgical History:  ?Procedure Laterality Date  ? BREAST BIOPSY Left 2019  ? x 2 biopsy  ? BREAST EXCISIONAL BIOPSY Left   ? BREAST LUMPECTOMY WITH RADIOACTIVE SEED AND SENTINEL LYMPH NODE BIOPSY Left 02/26/2021  ? Procedure: LEFT BREAST LUMPECTOMY WITH RADIOACTIVE SEED x2; LEFT AXILLARY SENTINEL NODE BIOPSY;  Surgeon: [name], MD;  Location: [place];  Service: General;  Laterality: Left;  ? BREAST LUMPECTOMY WITH RADIOFREQUENCY TAG IDENTIFICATION Left 3/71/0626  ? Procedure: LEFT BREAST MRI WIRE GUIDED EXCISION;  Surgeon: [name], MD;  Location: [place];  Service: General;  Laterality: Left;  ? BREAST REDUCTION SURGERY Bilateral 04/18/2021  ? Procedure: MAMMARY REDUCTION  (BREAST) LEFT ONCOPLASTIC BREAST RECONSTRUCTION, RIGHT BREAST REDUCTION;  Surgeon: Irene Limbo, MD;  Location: [place];  Service: Plastics;  Laterality: Bilateral;  ? CHOLECYSTECTOMY  2008  ? PORT-A-CATH REMOVAL Right 04/18/2021  ? Procedure: REMOVAL PORT-A-CATH;  Surgeon: [name], MD;  Location: [place];  Service: General;  Laterality: Right;  ? PORTACATH PLACEMENT N/A 09/05/2020  ? Procedure: INSERTION PORT-A-CATH;  Surgeon: [name], MD;  Location: [place];  Service: General;  Laterality: N/A;  PEC BLOCK  ? WISDOM  TOOTH EXTRACTION    ? ?Patient Active Problem List  ? Diagnosis Date Noted  ? Occipital neuralgia of right side 07/01/2021  ? Peripheral vertigo 07/01/2021  ? Port-A-Cath in place 09/20/2020  ? Genetic testing 09/04/2020  ? Family history of breast cancer   ? Family history of ovarian cancer   ? Family history of lung cancer   ? Malignant neoplasm of upper-outer quadrant of left breast in female, estrogen receptor positive ([place]) 08/21/2020  ? Anxiety 11/16/2013  ? Hypercoagulable state ([place]) 11/16/2013  ? TIA (transient ischemic attack) 11/15/2013  ? Chest discomfort 01/20/2012  ? Factor V Leiden ([place]) 01/20/2012  ? MVP (mitral valve prolapse) 01/20/2012  ? Acute bronchitis 12/23/2011  ? ? ?REFERRING DIAG: Left breast swelling and axillary tightness ? ?THERAPY DIAG:  ?Malignant neoplasm of upper-outer quadrant of left breast in female, estrogen receptor positive ([place]) ? ?Localized edema ? ?Abnormal posture ? ?Pain in Rt foot ? ?PERTINENT HISTORY: Lt lumpectomy with SLNB 0/4 LN on 02/26/21, reduction and re-excision on 04/18/21 with chemotherapy and radiation completed  ? ?PRECAUTIONS: Rt shoulder RTC, lymphedema Lt risk, TIA ? ?SUBJECTIVE: RT sciatica acting up today. Lt shoulder feeling better, crackles a bit.  ?Are you having pain? Yes ?NPRS scale: 5/10 ?Pain location:  RT leg to foot ?Pain orientation: left ?PAIN TYPE: aching, sore, ?Pain description: intermittent, achy ?Aggravating factors: Not sure ?Relieving factors: rest ? ? ?10/07/2021 Lymphedema measurements: 10 cm prx olecranon 34.1 ?  15 cm proximal to Olec 36.1  ( cm larger than right) ?TODAY'S TREATMENT  ? ?10/31/21: Pt arrives for aquatic physical therapy. Treatment took place in 3.5-5.5 feet of water. Water temperature was.92degrees F Pt entered the pool via stairs with light use of rails. Pt requires buoyancy of water for support and to offload joints with strengthening exercises.  ?80% depth standing against wall with mitten  hands: shld flex/ext 2x10 1 min rest break in between ?Side stepping with shld abd/add: 4 lengths then rest UE, repeat 4 lengths ?Standing 90 degree angle stretch 3 breaths then rest 3x ?Trunk rotation with blue noodle 1 min 2 ? ?10/17/21: Aquatic Treatment ?Pt arrives for aquatic physical therapy. Treatment took place in 3.5-5.5 feet of water. Water temperature was.92degrees F Pt entered the pool via stairs with light use of rails. Pt requires buoyancy of water for support and to offload joints with strengthening exercises.  ?Seated water bench with 75% submersion ?Pt performed seated LE AROM exercises 20x in all planes to acclimate to water and discuss current status. ?80% depth standing against wall with mitten hands: shld flex/ext 2x10 1 min rest break in between ?Side stepping with shld abd/add: 4 lengths then rest UE, repeat 4 lengths ?Standing 90 degree angle stretch 3 breaths then rest 3x ?Trunk rotation with blue noodle 1 min 2x ? ?10/14/2021 ?Pulleys flexion and abduction 2 min each to warm up, VC's not to overstretch ?MANUAL: soft tissue mobilization to left UT, pectorals and lats. PROM left shoulder flexion, abduction, ER ?MLD ?Short neck, 5 breaths, activated bilateral axillary LN's and left inguinal LN, anterior interaxillary pathway and left axillo inguinal pathway, then left breast retracing steps and left upper arm lateral, then medial to lateral and lateral arm retracing steps and ending with LN's. ? ?10/07/2021 ?Remeasured pts upper arm with only  cm difference at 10 cm proximal to olecranon.  Initiated MLD to left breast and MLD while instructing pt in steps. Short neck, 5 breaths, activated bilateral axillary LN's and left inguinal LN, anterior interaxillary pathway and left axillo inguinal pathway, then left breast retracing steps and left upper arm lateral, then medial to lateral and lateral arm retracing steps and ending with LN's. Pt practiced all techniques briefly and was given a  handout. ? ? ?09/26/21 ?TE:   ?Pulleys 66mn flexion and abduction ?Wall ball flexion 6" x 6 with cording pull noted  ?Doorway stretch 2x20"   ?MT: ?Soft tissue mobilization modified to avoid breast with infection: focused on MFR to left axillary region and upper arm during stretching with cording release ?PROM left shoulder flexion, scaption, Abd, ER ? ? ?  ?HOME EXERCISE PROGRAM: ?Abd wall slides, counter lat stretch, stargazer in supine on left, self left breast MLD ?Access Code: [email or web address] ?  ?ASSESSMENT: Pt arrives for first aquatic PT visit today with a RTLE flared up sciatica. PTA educated pt in water principles and how we we be using them. Pt verbally understood principles. Pt felt good relief of LE symptoms as we moved in the water even when participating in UE exercises. No pain or over stretching to LTUE. Pt enjoyed exercising in the water. ?  ?GOALS: ?Goals reviewed with patient? Yes ?  ?SHORT TERM GOALS: ?  ?STG Name Target Date Goal status  ?1 Pt will be independent and compliant with HEP ?Baseline:  10/02/2021 INITIAL  ?LONG TERM GOALS:  ?  ?LTG Name Target Date Goal status  ?1 Pt will have decreased left axillary/breast pain by 50%  or greater ?Baseline: 10/23/2021 INITIAL  ?2 Pt will have improved left shoulder abduction to 160 degrees  ?Baseline:   ? 10/23/2021 INITIAL  ?3 Pt will no longer complain of tightness with cording at end ranges ?Baseline: 10/23/2021 INITIAL  ?4 Pt will attend ABC class ?Baseline: 10/23/2021 INITIAL  ? PLAN: ?PT FREQUENCY: 2x/week ?  ?PT DURATION: 6 weeks ?  ?PLANNED INTERVENTIONS: Therapeutic exercises, Neuro Muscular re-education, Patient/Family education, Orthotic/Fit training, Dry Needling, scar mobilization, and Manual therapy, aquatic therapy ?  ?PLAN FOR NEXT SESSION: Review breast MLD/upper arm prn , Soft tissue mobilization to pectorals, Lats, Pec stretches,MFR to axillary cording prn ?  ? ?Access Code: [email or web address]: [email or web address]:  02/13/2023Prepared by: Marcene Brawn TevisExercises  ?Supine Lower Trunk Rotation - 1 x daily - 7 x weekly - 1 sets - 10 reps - 3 second hold  ?Supine Hamstring Stretch with Strap - 1 x daily - 7 x weekly - 1-3 sets - 3 reps - 20-30 seconds ho

## 2021-10-31 ENCOUNTER — Ambulatory Visit: Payer: BC Managed Care – PPO | Admitting: Physical Therapy

## 2021-10-31 DIAGNOSIS — R600 Localized edema: Secondary | ICD-10-CM

## 2021-10-31 DIAGNOSIS — R293 Abnormal posture: Secondary | ICD-10-CM

## 2021-10-31 DIAGNOSIS — C50412 Malignant neoplasm of upper-outer quadrant of left female breast: Secondary | ICD-10-CM

## 2021-11-05 ENCOUNTER — Other Ambulatory Visit: Payer: Self-pay | Admitting: Adult Health

## 2021-11-06 NOTE — Therapy (Signed)
?OUTPATIENT PHYSICAL THERAPY TREATMENT NOTE ? ? ?Patient Name: Tracy Dyer ?MRN: [id number] ?DOB:Sep 21, 1970, 51 y.o., female ?Today's Date: 11/07/2021 ? ?PCP: [name], MD ?REFERRING PROVIDER: Christoper Allegra, NP ? ? PT End of Session - 11/07/21 1435   ? ? Visit Number 9   ? Number of Visits 12   ? Date for PT Re-Evaluation 10/23/21   ? PT Start Time 1434   ? PT Stop Time 1510   ? PT Time Calculation (min) 36 min   ? Activity Tolerance Patient tolerated treatment well   ? Behavior During Therapy [facility] for tasks assessed/performed   ? ?  ?  ? ?  ? ? ? ? ? ? ?Past Medical History:  ?Diagnosis Date  ? Anxiety   ? Cancer [facility])   ? breast  ? Depression   ? Family history of breast cancer   ? Family history of lung cancer   ? Family history of ovarian cancer   ? GERD (gastroesophageal reflux disease)   ? diet controlled  ? History of radiation therapy   ? Left breast, left axilla- 05/27/21-07/10/21- Dr. [name]  ? Seasonal allergies   ? ?Past Surgical History:  ?Procedure Laterality Date  ? BREAST BIOPSY Left 2019  ? x 2 biopsy  ? BREAST EXCISIONAL BIOPSY Left   ? BREAST LUMPECTOMY WITH RADIOACTIVE SEED AND SENTINEL LYMPH NODE BIOPSY Left 02/26/2021  ? Procedure: LEFT BREAST LUMPECTOMY WITH RADIOACTIVE SEED x2; LEFT AXILLARY SENTINEL NODE BIOPSY;  Surgeon: [name], MD;  Location: [place];  Service: General;  Laterality: Left;  ? BREAST LUMPECTOMY WITH RADIOFREQUENCY TAG IDENTIFICATION Left 2/70/3500  ? Procedure: LEFT BREAST MRI WIRE GUIDED EXCISION;  Surgeon: [name], MD;  Location: [place];  Service: General;  Laterality: Left;  ? BREAST REDUCTION SURGERY Bilateral 04/18/2021  ? Procedure: MAMMARY REDUCTION  (BREAST) LEFT ONCOPLASTIC BREAST RECONSTRUCTION, RIGHT BREAST REDUCTION;  Surgeon: Irene Limbo, MD;  Location: [place];  Service: Plastics;  Laterality: Bilateral;  ? CHOLECYSTECTOMY  2008  ? PORT-A-CATH REMOVAL Right  04/18/2021  ? Procedure: REMOVAL PORT-A-CATH;  Surgeon: [name], MD;  Location: [place];  Service: General;  Laterality: Right;  ? PORTACATH PLACEMENT N/A 09/05/2020  ? Procedure: INSERTION PORT-A-CATH;  Surgeon: [name], MD;  Location: [place];  Service: General;  Laterality: N/A;  PEC BLOCK  ? WISDOM TOOTH EXTRACTION    ? ?Patient Active Problem List  ? Diagnosis Date Noted  ? Occipital neuralgia of right side 07/01/2021  ? Peripheral vertigo 07/01/2021  ? Port-A-Cath in place 09/20/2020  ? Genetic testing 09/04/2020  ? Family history of breast cancer   ? Family history of ovarian cancer   ? Family history of lung cancer   ? Malignant neoplasm of upper-outer quadrant of left breast in female, estrogen receptor positive ([place]) 08/21/2020  ? Anxiety 11/16/2013  ? Hypercoagulable state ([place]) 11/16/2013  ? TIA (transient ischemic attack) 11/15/2013  ? Chest discomfort 01/20/2012  ? Factor V Leiden ([place]) 01/20/2012  ? MVP (mitral valve prolapse) 01/20/2012  ? Acute bronchitis 12/23/2011  ? ? ?REFERRING DIAG: Left breast swelling and axillary tightness ? ?THERAPY DIAG:  ?Malignant neoplasm of upper-outer quadrant of left breast in female, estrogen receptor positive ([place]) ? ?Localized edema ? ?Abnormal posture ? ?Pain in Rt foot ? ?PERTINENT HISTORY: Lt lumpectomy with SLNB 0/4 LN on 02/26/21, reduction and re-excision on 04/18/21 with chemotherapy and radiation completed  ? ?PRECAUTIONS: Rt shoulder RTC,  lymphedema Lt risk, TIA ? ?SUBJECTIVE: I joined the pool it felt so good, but I think I over did the swimming. I have some fluid, maybe, in my side. I will call my MD next week. ? ?SUBJECTIVE: Are you having pain? No ? ?10/07/2021 Lymphedema measurements: 10 cm prx olecranon 34.1 ?                          15 cm proximal to Olec 36.1  ( cm larger than right) ? ?TODAY'S TREATMENT  ? ?4/7/2: Pt arrives for aquatic physical therapy. Treatment took place in 3.5-5.5 feet of water. Water  temperature was 91 degrees F.Pt entered the pool via stairs with light use of rails. Pt requires buoyancy of water for support and to offload joints with strengthening exercises.  ?80% depth standing against wall with mitten hands: shld flex/ext 2x10 1 min rest break in between ?Water weights ( multi-color) horizontal abd/add 10x, breast stroke arms 5x each way. ?Side stepping with shld abd/add: 4 lengths then rest UE, repeat 4 lengths ?Standing 90 degree angle stretch 3 breaths then rest 3x ?Trunk rotation with blue noodle 1 min 2 ?Discussed the idea of wearing her compression bra when swimming, discussing with Cancer PT about other ways to keep fluid down, also taking longer breaks with her swimming strokes.Pt verbally understood concepts. ? ?10/31/21: Pt arrives for aquatic physical therapy. Treatment took place in 3.5-5.5 feet of water. Water temperature was.92degrees F Pt entered the pool via stairs with light use of rails. Pt requires buoyancy of water for support and to offload joints with strengthening exercises.  ?80% depth standing against wall with mitten hands: shld flex/ext 2x10 1 min rest break in between ?Side stepping with shld abd/add: 4 lengths then rest UE, repeat 4 lengths ?Standing 90 degree angle stretch 3 breaths then rest 3x ?Trunk rotation with blue noodle 1 min 2 ? ?10/17/21: Aquatic Treatment ?Pt arrives for aquatic physical therapy. Treatment took place in 3.5-5.5 feet of water. Water temperature was.92degrees F Pt entered the pool via stairs with light use of rails. Pt requires buoyancy of water for support and to offload joints with strengthening exercises.  ?Seated water bench with 75% submersion ?Pt performed seated LE AROM exercises 20x in all planes to acclimate to water and discuss current status. ?80% depth standing against wall with mitten hands: shld flex/ext 2x10 1 min rest break in between ?Side stepping with shld abd/add: 4 lengths then rest UE, repeat 4 lengths ?Standing 90  degree angle stretch 3 breaths then rest 3x ?Trunk rotation with blue noodle 1 min 2x ? ?10/14/2021 ?Pulleys flexion and abduction 2 min each to warm up, VC's not to overstretch ?MANUAL: soft tissue mobilization to left UT, pectorals and lats. PROM left shoulder flexion, abduction, ER ?MLD ?Short neck, 5 breaths, activated bilateral axillary LN's and left inguinal LN, anterior interaxillary pathway and left axillo inguinal pathway, then left breast retracing steps and left upper arm lateral, then medial to lateral and lateral arm retracing steps and ending with LN's. ? ?10/07/2021 ?Remeasured pts upper arm with only  cm difference at 10 cm proximal to olecranon.  Initiated MLD to left breast and MLD while instructing pt in steps. Short neck, 5 breaths, activated bilateral axillary LN's and left inguinal LN, anterior interaxillary pathway and left axillo inguinal pathway, then left breast retracing steps and left upper arm lateral, then medial to lateral and lateral arm retracing steps and ending with LN's.  Pt practiced all techniques briefly and was given a handout. ? ? ?09/26/21 ?TE:   ?Pulleys 30mn flexion and abduction ?Wall ball flexion 6" x 6 with cording pull noted  ?Doorway stretch 2x20"   ?MT: ?Soft tissue mobilization modified to avoid breast with infection: focused on MFR to left axillary region and upper arm during stretching with cording release ?PROM left shoulder flexion, scaption, Abd, ER ? ? ?  ?HOME EXERCISE PROGRAM: ?Abd wall slides, counter lat stretch, stargazer in supine on left, self left breast MLD ?Access Code: [email or web address] ?  ?ASSESSMENT: Pt arrives for follow up aquatics treatment. Pt missed last week due to "having fluid in her Lt lateral trunk." Pt has not seen MD or PT in the clinic yet to check this out. Pt enjoyed her first aquatic session so much she joined the pool that day and has been going per her report to swim. PTA cautioned pt about doing too many laps before giving her arm a rest.  In addition she might want to ask about the benefit of wearing her compression bra while swimming? Pt has no pain while exercising in the pool. Pt is very motivated to exercise but admits she has a tendency to over

## 2021-11-07 ENCOUNTER — Ambulatory Visit: Payer: BC Managed Care – PPO | Attending: Adult Health | Admitting: Physical Therapy

## 2021-11-07 ENCOUNTER — Encounter: Payer: Self-pay | Admitting: Physical Therapy

## 2021-11-07 DIAGNOSIS — R600 Localized edema: Secondary | ICD-10-CM

## 2021-11-07 DIAGNOSIS — Z170 Estrogen receptor positive status [ER+]: Secondary | ICD-10-CM | POA: Diagnosis not present

## 2021-11-07 DIAGNOSIS — R293 Abnormal posture: Secondary | ICD-10-CM

## 2021-11-07 DIAGNOSIS — C50412 Malignant neoplasm of upper-outer quadrant of left female breast: Secondary | ICD-10-CM | POA: Diagnosis not present

## 2021-11-10 ENCOUNTER — Encounter: Payer: Self-pay | Admitting: Hematology and Oncology

## 2021-11-12 ENCOUNTER — Encounter (HOSPITAL_COMMUNITY): Payer: Self-pay

## 2021-11-19 ENCOUNTER — Other Ambulatory Visit: Payer: Self-pay

## 2021-11-19 MED ORDER — EXEMESTANE 25 MG PO TABS: 25.0000 mg | ORAL_TABLET | Freq: Every day | ORAL | 1 refills | Status: DC

## 2021-11-21 ENCOUNTER — Ambulatory Visit: Payer: BC Managed Care – PPO | Admitting: Physical Therapy

## 2021-11-27 ENCOUNTER — Telehealth: Payer: Self-pay | Admitting: *Deleted

## 2021-11-28 ENCOUNTER — Ambulatory Visit: Payer: BC Managed Care – PPO | Admitting: Physical Therapy

## 2021-11-29 ENCOUNTER — Other Ambulatory Visit: Payer: Self-pay | Admitting: Hematology and Oncology

## 2021-11-29 DIAGNOSIS — F419 Anxiety disorder, unspecified: Secondary | ICD-10-CM

## 2021-12-02 ENCOUNTER — Encounter: Payer: Self-pay | Admitting: Adult Health

## 2021-12-02 ENCOUNTER — Inpatient Hospital Stay: Payer: BC Managed Care – PPO | Attending: Adult Health | Admitting: Adult Health

## 2021-12-02 DIAGNOSIS — E2839 Other primary ovarian failure: Secondary | ICD-10-CM | POA: Diagnosis not present

## 2021-12-02 DIAGNOSIS — M5431 Sciatica, right side: Secondary | ICD-10-CM | POA: Diagnosis not present

## 2021-12-02 DIAGNOSIS — Z170 Estrogen receptor positive status [ER+]: Secondary | ICD-10-CM

## 2021-12-02 DIAGNOSIS — C50412 Malignant neoplasm of upper-outer quadrant of left female breast: Secondary | ICD-10-CM

## 2021-12-02 NOTE — Progress Notes (Signed)
SURVIVORSHIP VIRTUAL VISIT: ? ?I connected with Tracy Dyer on 12/04/21 at  2:15 PM EDT by my chart video and verified that I am speaking with the correct person using two identifiers.  ?I discussed the limitations, risks, security and privacy concerns of performing an evaluation and management service by telephone and the availability of in person appointments. I also discussed with the patient that there may be a patient responsible charge related to this service. The patient expressed understanding and agreed to proceed.  ?Patient location: in her office in [place], [place] ?Provider location: at her home in [place], [place] ?Others participating in call: none ? ?BRIEF ONCOLOGIC HISTORY:  ?Oncology History  ?Malignant neoplasm of upper-outer quadrant of left breast in female, estrogen receptor positive ([place])  ?08/21/2020 Initial Diagnosis  ? 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%, PR 95%, HER2 equivocal, FISH pending, Ki-67 20% ?  ?08/21/2020 Cancer Staging  ? Staging form: Breast, AJCC 8th Edition ?- Clinical stage from 08/21/2020: Stage IIA (cT1c, cN1, cM0, G3, ER+, PR+, HER2-) - Signed by [name], MD on 08/21/2020 ? ?  ?09/04/2020 Genetic Testing  ? Negative genetic testing. No pathogenic variants identified on the [facility] Breast Cancer STAT Panel + Multi-Cancer Panel. VUS in [facility] called c.983C>T identified. The report date is 09/04/2020.  ? ?The STAT Breast cancer panel offered by [facility] includes sequencing and rearrangement analysis for the following 9 genes:  ATM, BRCA1, BRCA2, CDH1, CHEK2, PALB2, PTEN, STK11 and TP53.   ? ?The Multi-Cancer Panel offered by [facility] includes sequencing and/or deletion duplication testing of the  following 85 genes: AIP, ALK, APC, ATM, AXIN2,BAP1,  BARD1, BLM, BMPR1A, BRCA1, BRCA2, BRIP1, CASR, CDC73, CDH1, CDK4, CDKN1B, CDKN1C, CDKN2A (p14ARF), CDKN2A (p16INK4a), CEBPA, CHEK2, CTNNA1, DICER1, DIS3L2, EGFR (c.2369C>T, p.Thr790Met variant only), EPCAM (Deletion/duplication testing only), FH, FLCN, GATA2, GPC3, GREM1 (Promoter region deletion/duplication testing only), HOXB13 (c.251G>A, p.Gly84Glu), HRAS, KIT, MAX, MEN1, MET, MITF (c.952G>A, p.Glu318Lys variant only), MLH1, MSH2, MSH3, MSH6, MUTYH, NBN, NF1, NF2, NTHL1, PALB2, PDGFRA, PHOX2B, PMS2, POLD1, POLE, POT1, PRKAR1A, PTCH1, PTEN, RAD50, RAD51C, RAD51D, RB1, RECQL4, RET, RNF43, RUNX1, SDHAF2, SDHA (sequence changes only), SDHB, SDHC, SDHD, SMAD4, SMARCA4, SMARCB1, SMARCE1, STK11, SUFU, TERC, TERT, TMEM127, TP53, TSC1, TSC2, VHL, WRN and WT1.  ?  ?09/06/2020 - 01/17/2021 Chemotherapy  ? AC X 4 foll by Taxol ? ?  ? ?  ?02/26/2021 Surgery  ? Left lumpectomy: no residual carcinoma with margins and axillary lymph nodes also negative for carcinoma ? ? ?  ?02/26/2021 Cancer Staging  ? Staging form: Breast, AJCC 8th Edition ?- Pathologic stage from 02/26/2021: No Stage Recommended (ypT0, pN0, cM0) - Signed by [name], NP on 12/04/2021 ?Stage prefix: Post-therapy ? ?  ?04/18/2021 Surgery  ? Removal of retained clip, port removal and bilateral breast mammoplasty  ?  ?05/27/2021 - 07/10/2021 Radiation Therapy  ?  ?Site Technique Total Dose (Gy) Dose per Fx (Gy) Completed Fx Beam Energies  ?Breast, Left: Breast_Lt 3D 50.4/50.4 1.8 28/28 6X, 10X  ?Axilla, Left: Axilla_Lt 3D 50.4/50.4 1.8 28/28 6X, 15X  ?  ?08/2021 -  Anti-estrogen oral therapy  ? Unable to tolerate tamoxifen or letrozole, changed to exemestane daily 09/18/21 ?  ? ? ?INTERVAL HISTORY:  ?Tracy Dyer to review her  survivorship care plan detailing her treatment course for breast cancer, as well as monitoring long-term side effects of that treatment, education regarding health maintenance,  screening, and overall wellness and health promotion.    ? ?Overall, Tracy Dyer reports feeling quite well.  She is taking Exemestane daily.  She is tolerating it better than previous medication with the exception of hair loss.  She is taking a vitamin to support her hair growth.  She has been having increased breast pain and some mild swelling over hte past few days including a new cord.   ? ?She is overall feeling better and is continuing to recover from her cancer treatment.   ? ?REVIEW OF SYSTEMS:  ?Review of Systems  ?Constitutional:  Positive for fatigue (mild). Negative for appetite change, chills, fever and unexpected weight change.  ?HENT:   Negative for hearing loss, lump/mass and trouble swallowing.   ?Eyes:  Negative for eye problems and icterus.  ?Respiratory:  Negative for chest tightness, cough and shortness of breath.   ?Cardiovascular:  Negative for chest pain, leg swelling and palpitations.  ?Gastrointestinal:  Negative for abdominal distention, abdominal pain, constipation, diarrhea, nausea and vomiting.  ?Endocrine: Negative for hot flashes.  ?Genitourinary:  Negative for difficulty urinating.   ?Musculoskeletal:  Negative for arthralgias.  ?Skin:  Negative for itching and rash.  ?Neurological:  Negative for dizziness, extremity weakness, headaches and numbness.  ?Hematological:  Negative for adenopathy. Does not bruise/bleed easily.  ?Psychiatric/Behavioral:  Negative for depression. The patient is nervous/anxious.   ?Breast: Denies any new nodularity, masses, tenderness, nipple changes, or nipple discharge.  ? ? ? ? ?ONCOLOGY TREATMENT TEAM:  ?1. Surgeon:  Dr. Donne Hazel at [facility] ?2. Medical Oncologist: Dr. Lindi Adie  ?3. Radiation Oncologist: Dr. Sondra Come ?  ? ?PAST MEDICAL/SURGICAL HISTORY:  ?Past Medical History:  ?Diagnosis Date  ? Anxiety   ? Cancer [facility])   ? breast  ? Depression   ? Family history of breast cancer   ? Family history of lung cancer   ? Family history of  ovarian cancer   ? GERD (gastroesophageal reflux disease)   ? diet controlled  ? History of radiation therapy   ? Left breast, left axilla- 05/27/21-07/10/21- Dr. [name]  ? Seasonal allergies   ? ?Past Surgical History:  ?Procedure Laterality Date  ? BREAST BIOPSY Left 2019  ? x 2 biopsy  ? BREAST EXCISIONAL BIOPSY Left   ? BREAST LUMPECTOMY WITH RADIOACTIVE SEED AND SENTINEL LYMPH NODE BIOPSY Left 02/26/2021  ? Procedure: LEFT BREAST LUMPECTOMY WITH RADIOACTIVE SEED x2; LEFT AXILLARY SENTINEL NODE BIOPSY;  Surgeon: [name], MD;  Location: [place];  Service: General;  Laterality: Left;  ? BREAST LUMPECTOMY WITH RADIOFREQUENCY TAG IDENTIFICATION Left 7/82/4235  ? Procedure: LEFT BREAST MRI WIRE GUIDED EXCISION;  Surgeon: [name], MD;  Location: [place];  Service: General;  Laterality: Left;  ? BREAST REDUCTION SURGERY Bilateral 04/18/2021  ? Procedure: MAMMARY REDUCTION  (BREAST) LEFT ONCOPLASTIC BREAST RECONSTRUCTION, RIGHT BREAST REDUCTION;  Surgeon: Irene Limbo, MD;  Location: [place];  Service: Plastics;  Laterality: Bilateral;  ? CHOLECYSTECTOMY  2008  ? PORT-A-CATH REMOVAL Right 04/18/2021  ? Procedure: REMOVAL PORT-A-CATH;  Surgeon: [name], MD;  Location: [place];  Service: General;  Laterality: Right;  ? PORTACATH PLACEMENT N/A 09/05/2020  ? Procedure: INSERTION PORT-A-CATH;  Surgeon: [name], MD;  Location: [place];  Service: General;  Laterality: N/A;  PEC BLOCK  ?  WISDOM TOOTH EXTRACTION    ? ? ? ?ALLERGIES:  ?Allergies  ?Allergen Reactions  ? Sulfa Antibiotics   ?  Other reaction(s): Rash, urticarial  ? Sulfamethoxazole Rash  ?  Feel sun burn  ? ? ? ?CURRENT MEDICATIONS:  ?Outpatient Encounter Medications as of 12/02/2021  ?Medication Sig  ? albuterol (VENTOLIN HFA) 108 (90 Base) MCG/ACT inhaler Inhale 1-2 puffs into the lungs every 4 (four) hours as needed for wheezing or shortness of  breath.  ? calcium carbonate (TUMS - DOSED IN MG ELEMENTAL CALCIUM) 500 MG chewable tablet Chew 4 tablets by mouth as needed for indigestion or heartburn.  ? clonazePAM (KLONOPIN) 1 MG tablet TAKE 1 TABLET BY MOUTH TW

## 2021-12-04 ENCOUNTER — Encounter: Payer: Self-pay | Admitting: Adult Health

## 2021-12-04 ENCOUNTER — Telehealth: Payer: Self-pay

## 2021-12-04 DIAGNOSIS — M9901 Segmental and somatic dysfunction of cervical region: Secondary | ICD-10-CM | POA: Diagnosis not present

## 2021-12-04 DIAGNOSIS — M9907 Segmental and somatic dysfunction of upper extremity: Secondary | ICD-10-CM | POA: Diagnosis not present

## 2021-12-04 DIAGNOSIS — M9902 Segmental and somatic dysfunction of thoracic region: Secondary | ICD-10-CM | POA: Diagnosis not present

## 2021-12-04 DIAGNOSIS — M7541 Impingement syndrome of right shoulder: Secondary | ICD-10-CM | POA: Diagnosis not present

## 2021-12-04 NOTE — Telephone Encounter (Signed)
Called and left message for pt regarding bone density needing to be completed ?

## 2021-12-08 ENCOUNTER — Ambulatory Visit: Payer: BC Managed Care – PPO | Attending: Adult Health

## 2021-12-08 DIAGNOSIS — R600 Localized edema: Secondary | ICD-10-CM | POA: Insufficient documentation

## 2021-12-08 DIAGNOSIS — M7541 Impingement syndrome of right shoulder: Secondary | ICD-10-CM | POA: Diagnosis not present

## 2021-12-08 DIAGNOSIS — R293 Abnormal posture: Secondary | ICD-10-CM | POA: Insufficient documentation

## 2021-12-08 DIAGNOSIS — M9901 Segmental and somatic dysfunction of cervical region: Secondary | ICD-10-CM | POA: Diagnosis not present

## 2021-12-08 DIAGNOSIS — Z170 Estrogen receptor positive status [ER+]: Secondary | ICD-10-CM | POA: Insufficient documentation

## 2021-12-08 DIAGNOSIS — C50412 Malignant neoplasm of upper-outer quadrant of left female breast: Secondary | ICD-10-CM | POA: Insufficient documentation

## 2021-12-08 DIAGNOSIS — M9907 Segmental and somatic dysfunction of upper extremity: Secondary | ICD-10-CM | POA: Diagnosis not present

## 2021-12-08 DIAGNOSIS — M9902 Segmental and somatic dysfunction of thoracic region: Secondary | ICD-10-CM | POA: Diagnosis not present

## 2021-12-22 ENCOUNTER — Telehealth: Payer: Self-pay

## 2021-12-22 ENCOUNTER — Ambulatory Visit (HOSPITAL_BASED_OUTPATIENT_CLINIC_OR_DEPARTMENT_OTHER)
Admission: RE | Admit: 2021-12-22 | Discharge: 2021-12-22 | Disposition: A | Discharge status: Home / Self Care | Payer: BC Managed Care – PPO | Source: Ambulatory Visit | Attending: Adult Health | Admitting: Adult Health

## 2021-12-22 DIAGNOSIS — Z780 Asymptomatic menopausal state: Secondary | ICD-10-CM | POA: Diagnosis not present

## 2021-12-22 DIAGNOSIS — E2839 Other primary ovarian failure: Secondary | ICD-10-CM | POA: Diagnosis not present

## 2021-12-22 NOTE — Telephone Encounter (Signed)
Called pt to give results per NP. Pt verbalized understanding and knows to call with any concerns.

## 2021-12-22 NOTE — Telephone Encounter (Signed)
-----   Message from [name], NP sent at 12/22/2021  2:58 PM EDT ----- Please let Grae know her bone density is normal! ----- Message ----- From: Interface, Rad Results In Sent: 12/22/2021   1:55 PM EDT To: [name], NP

## 2021-12-30 ENCOUNTER — Ambulatory Visit (HOSPITAL_COMMUNITY)
Admission: RE | Admit: 2021-12-30 | Discharge: 2021-12-30 | Disposition: A | Discharge status: Home / Self Care | Payer: BC Managed Care – PPO | Source: Ambulatory Visit | Attending: Pulmonary Disease | Admitting: Pulmonary Disease

## 2021-12-30 DIAGNOSIS — R918 Other nonspecific abnormal finding of lung field: Secondary | ICD-10-CM | POA: Diagnosis not present

## 2021-12-30 DIAGNOSIS — R911 Solitary pulmonary nodule: Secondary | ICD-10-CM | POA: Diagnosis not present

## 2021-12-30 IMAGING — CT CT CHEST SUPER D W/O CM
2 of 4 series · 15 of 36 positions shown, 18 images · non-contrast
Comparison: PET-CT, [DATE], CT chest abdomen pelvis, [DATE]

CLINICAL DATA: Follow-up lung nodules, history of breast cancer,
chemotherapy and XRT to the left breast * Tracking Code: BO *

EXAM:
CT CHEST WITHOUT CONTRAST
TECHNIQUE: Multidetector CT imaging of the chest was performed using thin slice
collimation for electromagnetic bronchoscopy planning purposes,
without intravenous contrast.
RADIATION DOSE REDUCTION: This exam was performed according to the
departmental dose-optimization program which includes automated
exposure control, adjustment of the mA and/or kV according to
patient size and/or use of iterative reconstruction technique.

[Series 2: thorax · axial · 0.77mm/px · z∈[-289,-35]mm · 12 of 151 slices shown, 15 images]
[im 12/151  mediastinal]
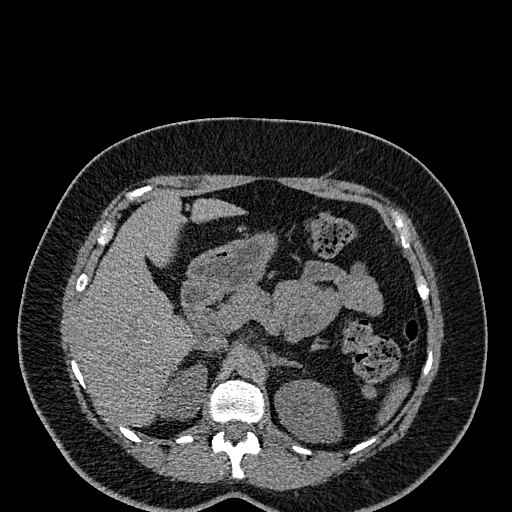
[im 12/151  lung]
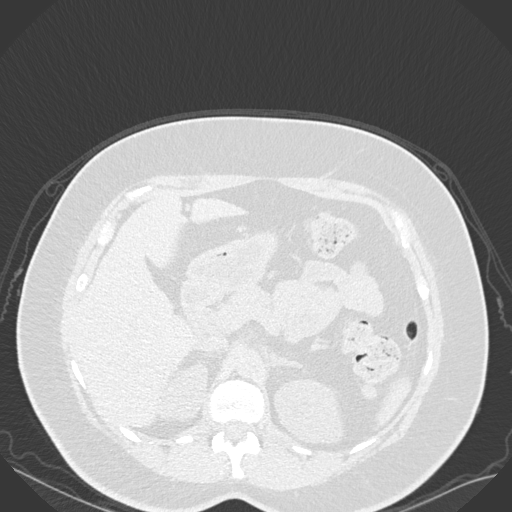
[im 24/151  lung]
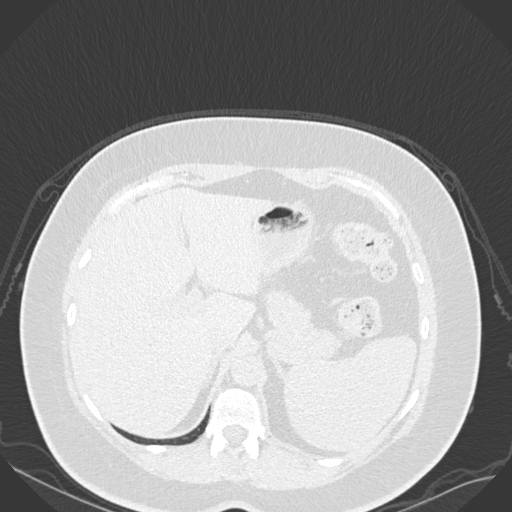
[im 35/151  lung]
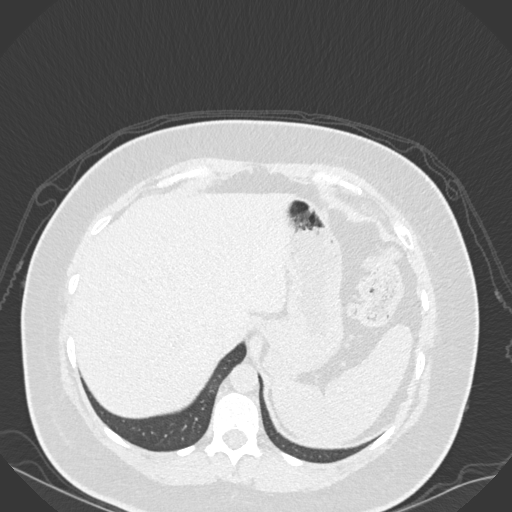
[im 47/151  lung]
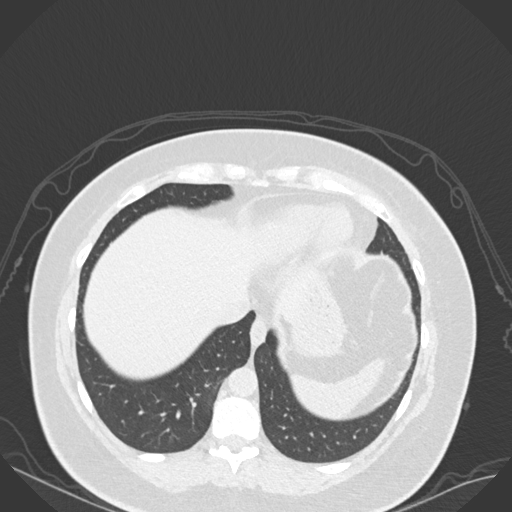
[im 58/151  mediastinal]
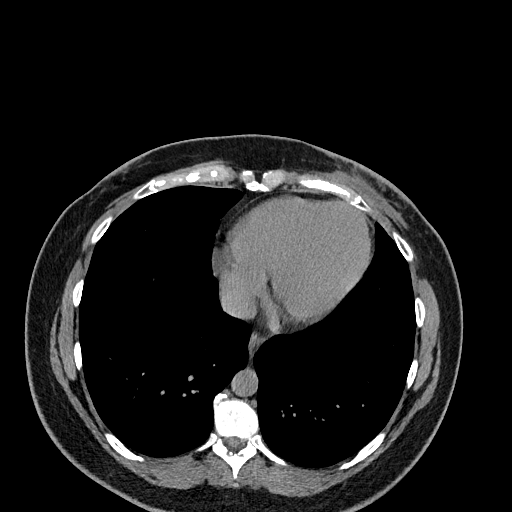
[im 58/151  lung]
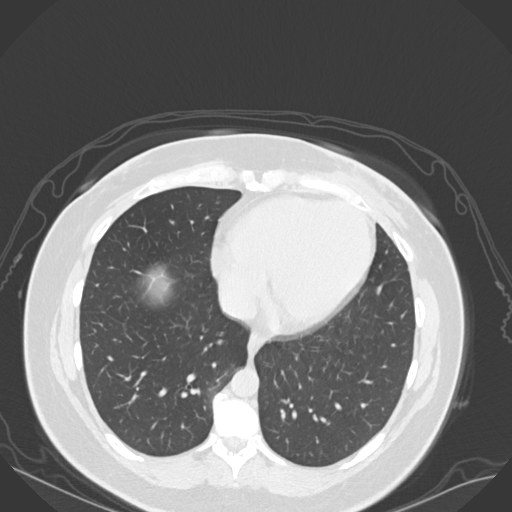
[im 70/151  lung]
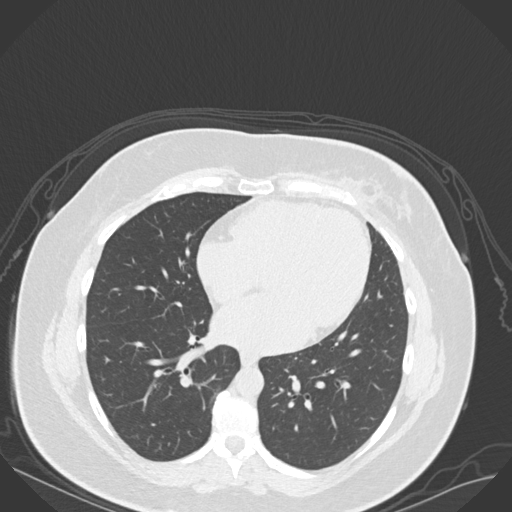
[im 81/151  lung]
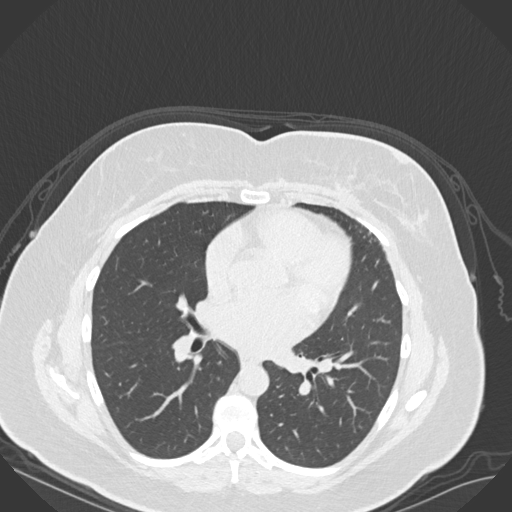
[im 93/151  lung]
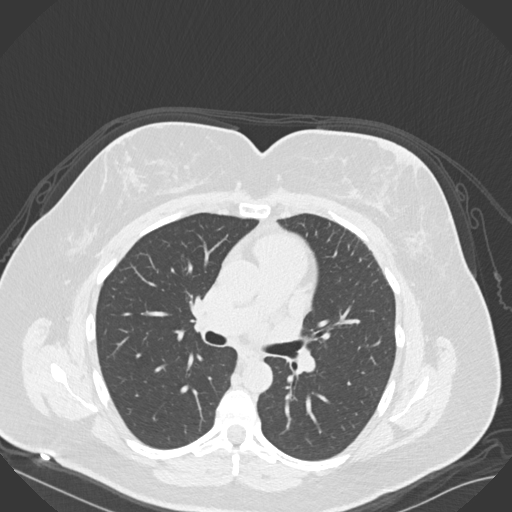
[im 104/151  mediastinal]
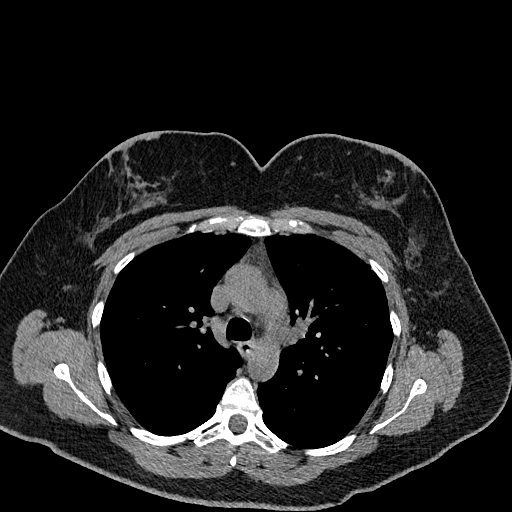
[im 104/151  lung]
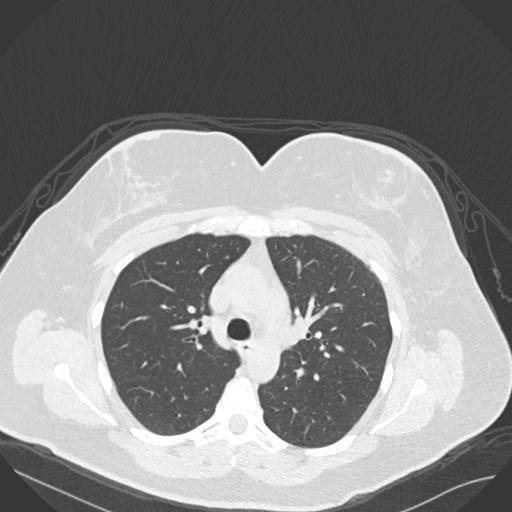
[im 116/151  lung]
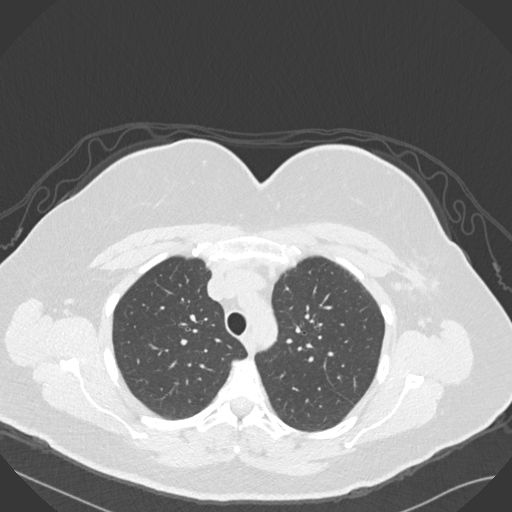
[im 127/151  lung]
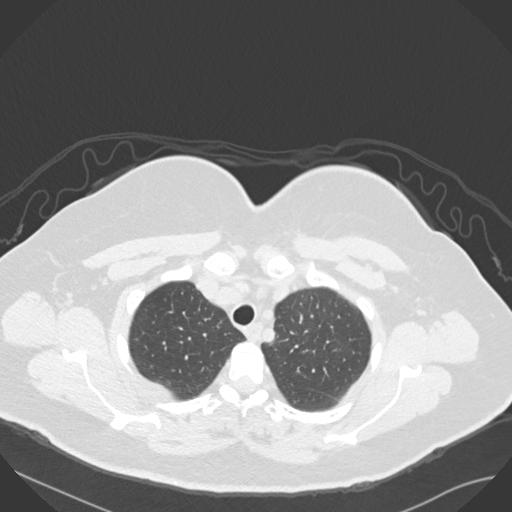
[im 139/151  lung]
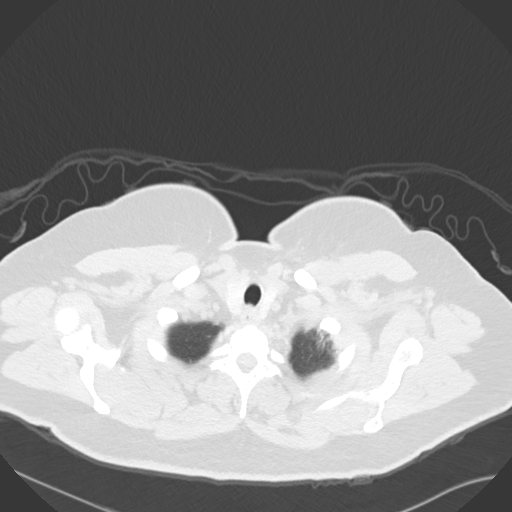

[Series 4: coronal · coronal · 0.69mm/px · 3 of 123 slices shown]
[im 25/123  lung]
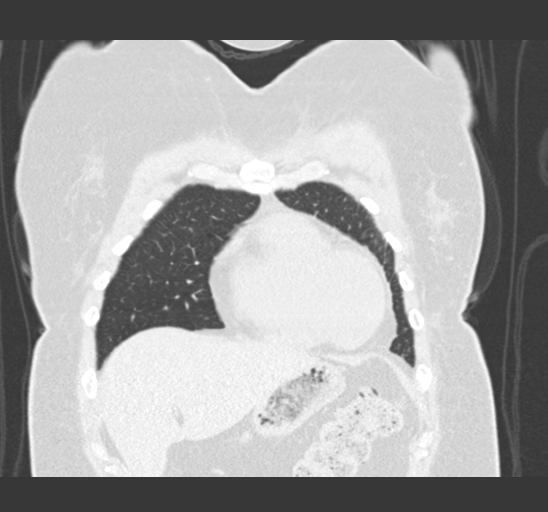
[im 49/123  lung]
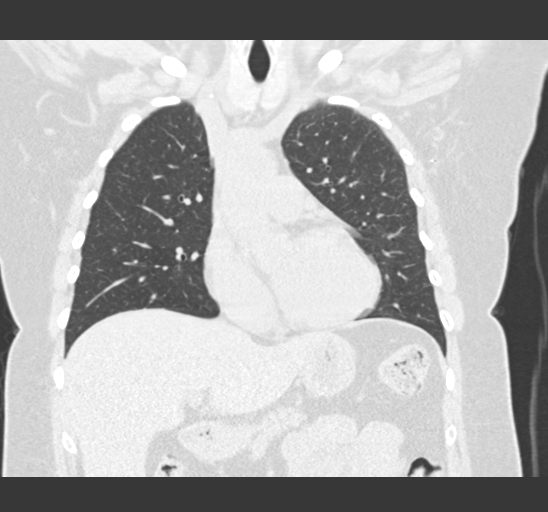
[im 74/123  lung]
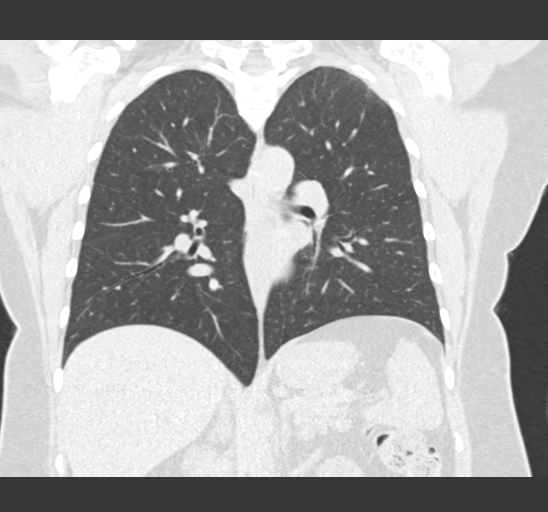

[15 of 36 positions shown; findings below may reference images not displayed]

FINDINGS: Cardiovascular: No significant vascular findings. Normal heart size.
No pericardial effusion.

Mediastinum/Nodes: No enlarged mediastinal, hilar, or axillary lymph
nodes. Thymic remnant in the anterior mediastinum. Thyroid gland,
trachea, and esophagus demonstrate no significant findings.

Lungs/Pleura: Previously seen somewhat nodular opacities of the left
apex are resolved, with minimal subpleural radiation fibrosis of the
anterior left upper lobe. No pleural effusion or pneumothorax.

Upper Abdomen: No acute abnormality.  Status post cholecystectomy.

Musculoskeletal: Postoperative findings of left lumpectomy and
axillary lymph node dissection, with unchanged appearance of
spiculated soft tissue in the left axilla, measuring 2.8 x 1.5 cm
(series 2, image 38). This was not previously PET avid. No
suspicious osseous lesions identified.
IMPRESSION: 1. Previously seen somewhat nodular opacities of the left apex are
resolved, consistent with resolution of nonspecific infection or
inflammation, including radiation pneumonitis, with minimal
subpleural radiation fibrosis of the anterior left upper lobe.
2. Postoperative findings of left lumpectomy and axillary lymph node
dissection, with unchanged appearance of spiculated soft tissue in
the left axilla, measuring 2.8 x 1.5 cm. This was not previously PET
avid and most likely reflects postoperative scarring. Continued
attention on follow-up.

## 2022-01-02 ENCOUNTER — Encounter: Payer: Self-pay | Admitting: Pulmonary Disease

## 2022-01-02 ENCOUNTER — Ambulatory Visit (INDEPENDENT_AMBULATORY_CARE_PROVIDER_SITE_OTHER): Payer: BC Managed Care – PPO | Admitting: Pulmonary Disease

## 2022-01-02 VITALS — BP 106/68 | HR 55 | Temp 97.6°F | Ht 66.0 in | Wt 211.4 lb

## 2022-01-02 DIAGNOSIS — Z8616 Personal history of COVID-19: Secondary | ICD-10-CM | POA: Diagnosis not present

## 2022-01-02 DIAGNOSIS — R918 Other nonspecific abnormal finding of lung field: Secondary | ICD-10-CM | POA: Diagnosis not present

## 2022-01-02 DIAGNOSIS — R911 Solitary pulmonary nodule: Secondary | ICD-10-CM | POA: Diagnosis not present

## 2022-01-02 NOTE — Patient Instructions (Signed)
Thank you for visiting Dr. Valeta Harms at [facility]. Today we recommend the following:  Follow up scans with Dr. Lindi Adie   Return if symptoms worsen or fail to improve.    Please do your part to reduce the spread of COVID-19.

## 2022-01-02 NOTE — Progress Notes (Signed)
Synopsis: Referred in March 2023 for lung nodules, oncology Dr. Lindi Adie, PCP: By [name], MD  Subjective:   PATIENT ID: Tracy Dyer GENDER: female DOB: 1970-10-23, MRN: [id number]  Chief Complaint  Patient presents with   Follow-up    Follow up. Patient says her breathing feels heavier but she thinks it's allergies.     This is a 51 year old female, past medical history of breast cancer, family history of breast cancer, family history of lung cancer, family history of ovarian cancer, she had left breast cancer in October 2022 treated with surgery and radiation to the left breast and left axilla.  She had follow-up CT imaging of the chest which revealed groundglass nodularity within the left upper lobe.  She also had radiation changes to the left anterior chest wall.  Patient had subsequent pet imaging.  I reviewed patient's CT imaging as well as pet imaging that was completed on 10/01/2021.  Pet imaging revealed few areas of scattered groundglass within the left upper lobe patient was referred for review of imaging and discussed neck steps regarding nodularity in the left upper lobe.  OV 01/02/2022:Here today for follow-up CT imaging.Patient has CT scan of the chest on 12/30/2021 the nodular left upper lobe opacities have resolved.  Suggestive of an inflammatory etiology.  Spiculated soft tissue within the left axilla not PET avid on last scan concerning for postoperative scarring radiology recommending continued follow-up.  She sees Dr. Lindi Adie from medical oncology.   Past Medical History:  Diagnosis Date   Anxiety    Cancer ([place])    breast   Depression    Family history of breast cancer    Family history of lung cancer    Family history of ovarian cancer    GERD (gastroesophageal reflux disease)    diet controlled   History of radiation therapy    Left breast, left axilla- 05/27/21-07/10/21- Dr. [name]   Seasonal allergies      Family History  Problem Relation Age of  Onset   Eczema Father    Allergic rhinitis Brother    Breast cancer Mother        in 89's   Cancer Maternal Uncle        unk type   Lung cancer Maternal Grandfather 42   Heart Problems Paternal Grandfather    Melanoma Cousin    Ovarian cancer Paternal Great-grandmother    Cancer Paternal Great-grandmother        GYN cancer, possibly ovarian?   Cancer Maternal Aunt        half aunts/uncles x5- rare cancers     Past Surgical History:  Procedure Laterality Date   BREAST BIOPSY Left 2019   x 2 biopsy   BREAST EXCISIONAL BIOPSY Left    BREAST LUMPECTOMY WITH RADIOACTIVE SEED AND SENTINEL LYMPH NODE BIOPSY Left 02/26/2021   Procedure: LEFT BREAST LUMPECTOMY WITH RADIOACTIVE SEED x2; LEFT AXILLARY SENTINEL NODE BIOPSY;  Surgeon: [name], MD;  Location: [place];  Service: General;  Laterality: Left;   BREAST LUMPECTOMY WITH RADIOFREQUENCY TAG IDENTIFICATION Left 0/48/8891   Procedure: LEFT BREAST MRI WIRE GUIDED EXCISION;  Surgeon: [name], MD;  Location: [place];  Service: General;  Laterality: Left;   BREAST REDUCTION SURGERY Bilateral 04/18/2021   Procedure: MAMMARY REDUCTION  (BREAST) LEFT ONCOPLASTIC BREAST RECONSTRUCTION, RIGHT BREAST REDUCTION;  Surgeon: Irene Limbo, MD;  Location: [place];  Service: Plastics;  Laterality: Bilateral;   CHOLECYSTECTOMY  2008   PORT-A-CATH  REMOVAL Right 04/18/2021   Procedure: REMOVAL PORT-A-CATH;  Surgeon: [name], MD;  Location: [place];  Service: General;  Laterality: Right;   PORTACATH PLACEMENT N/A 09/05/2020   Procedure: INSERTION PORT-A-CATH;  Surgeon: [name], MD;  Location: [place];  Service: General;  Laterality: N/A;  PEC BLOCK   WISDOM TOOTH EXTRACTION      Social History   Socioeconomic History   Marital status: Married    Spouse name: Not on file   Number of children: Not on file   Years of education: Not on file    Highest education level: Not on file  Occupational History   Not on file  Tobacco Use   Smoking status: Never   Smokeless tobacco: Never  Vaping Use   Vaping Use: Never used  Substance and Sexual Activity   Alcohol use: Not Currently   Drug use: Never   Sexual activity: Yes    Birth control/protection: None  Other Topics Concern   Not on file  Social History Narrative   Not on file   Social Determinants of Health   Financial Resource Strain: Not on file  Food Insecurity: Not on file  Transportation Needs: Not on file  Physical Activity: Not on file  Stress: Not on file  Social Connections: Not on file  Intimate Partner Violence: Not on file     Allergies  Allergen Reactions   Sulfa Antibiotics     Other reaction(s): Rash, urticarial   Sulfamethoxazole Rash    Feel sun burn     Outpatient Medications Prior to Visit  Medication Sig Dispense Refill   albuterol (VENTOLIN HFA) 108 (90 Base) MCG/ACT inhaler Inhale 1-2 puffs into the lungs every 4 (four) hours as needed for wheezing or shortness of breath. 8 g 2   calcium carbonate (TUMS - DOSED IN MG ELEMENTAL CALCIUM) 500 MG chewable tablet Chew 4 tablets by mouth as needed for indigestion or heartburn.     clonazePAM (KLONOPIN) 1 MG tablet TAKE 1 TABLET BY MOUTH TWICE A DAY AS NEEDED FOR ANXIETY 60 tablet 1   exemestane (AROMASIN) 25 MG tablet Take 1 tablet (25 mg total) by mouth daily after breakfast. 90 tablet 1   fluticasone (FLONASE) 50 MCG/ACT nasal spray Place 1 spray into both nostrils daily as needed for allergies.     l-methylfolate-B6-B12 (METANX) 3-35-2 MG TABS tablet Take 1 tablet by mouth daily.     LORazepam (ATIVAN) 0.5 MG tablet TAKE 1 TABLET BY MOUTH THREE TIMES A DAY AS NEEDED 40 tablet 0   MAGNESIUM CITRATE PO Take 405 mg by mouth daily. 135 mg     meloxicam (MOBIC) 7.5 MG tablet Take 1 tablet (7.5 mg total) by mouth daily as needed for pain. 14 tablet 0   Multiple Vitamins-Minerals (MULTIVITAMIN WITH  MINERALS) tablet Take 1 tablet by mouth daily.     omeprazole (PRILOSEC) 20 MG capsule Take 1 capsule (20 mg total) by mouth daily. 30 capsule 3   oxyCODONE (ROXICODONE) 5 MG immediate release tablet Take 1 tablet (5 mg total) by mouth every 4 (four) hours as needed. 20 tablet 0   pseudoephedrine (SUDAFED) 30 MG tablet Take 30 mg by mouth one time in dialysis.     sertraline (ZOLOFT) 100 MG tablet TAKE 1 TABLET BY MOUTH EVERYDAY AT BEDTIME 90 tablet 1   Turmeric (CURCUMIN 95 PO) Take by mouth.     vitamin B-12 (CYANOCOBALAMIN) 100 MCG tablet Take 100 mcg by mouth daily.  No facility-administered medications prior to visit.    Review of Systems  Constitutional:  Negative for chills, fever, malaise/fatigue and weight loss.  HENT:  Negative for hearing loss, sore throat and tinnitus.   Eyes:  Negative for blurred vision and double vision.  Respiratory:  Negative for cough, hemoptysis, sputum production, shortness of breath, wheezing and stridor.   Cardiovascular:  Negative for chest pain, palpitations, orthopnea, leg swelling and PND.  Gastrointestinal:  Negative for abdominal pain, constipation, diarrhea, heartburn, nausea and vomiting.  Genitourinary:  Negative for dysuria, hematuria and urgency.  Musculoskeletal:  Negative for joint pain and myalgias.  Skin:  Negative for itching and rash.  Neurological:  Negative for dizziness, tingling, weakness and headaches.  Endo/Heme/Allergies:  Negative for environmental allergies. Does not bruise/bleed easily.  Psychiatric/Behavioral:  Negative for depression. The patient is not nervous/anxious and does not have insomnia.   All other systems reviewed and are negative.   Objective:  Physical Exam Vitals reviewed.  Constitutional:      General: She is not in acute distress.    Appearance: She is well-developed. She is obese.  HENT:     Head: Normocephalic and atraumatic.  Eyes:     General: No scleral icterus.    Conjunctiva/sclera:  Conjunctivae normal.     Pupils: Pupils are equal, round, and reactive to light.  Neck:     Vascular: No JVD.     Trachea: No tracheal deviation.  Cardiovascular:     Rate and Rhythm: Normal rate and regular rhythm.     Heart sounds: Normal heart sounds. No murmur heard. Pulmonary:     Effort: Pulmonary effort is normal. No tachypnea, accessory muscle usage or respiratory distress.     Breath sounds: Normal breath sounds. No stridor. No wheezing, rhonchi or rales.  Abdominal:     General: Bowel sounds are normal. There is no distension.     Palpations: Abdomen is soft.     Tenderness: There is no abdominal tenderness.  Musculoskeletal:        General: No tenderness.     Cervical back: Neck supple.  Lymphadenopathy:     Cervical: No cervical adenopathy.  Skin:    General: Skin is warm and dry.     Capillary Refill: Capillary refill takes less than 2 seconds.     Findings: No rash.  Neurological:     Mental Status: She is alert and oriented to person, place, and time.  Psychiatric:        Behavior: Behavior normal.     Vitals:   01/02/22 0856  BP: 106/68  Pulse: (!) 55  Temp: 97.6 F (36.4 C)  TempSrc: Oral  SpO2: 97%  Weight: 211 lb 6.4 oz (95.9 kg)  Height: '5\' 6"'$  (1.676 m)   97% on RA BMI Readings from Last 3 Encounters:  01/02/22 34.12 kg/m  10/06/21 32.99 kg/m  10/02/21 32.78 kg/m   Wt Readings from Last 3 Encounters:  01/02/22 211 lb 6.4 oz (95.9 kg)  10/06/21 204 lb 6.4 oz (92.7 kg)  10/02/21 203 lb 1.6 oz (92.1 kg)     CBC    Component Value Date/Time   WBC 4.6 09/04/2021 1154   WBC 3.2 (L) 01/17/2021 1015   RBC 4.37 09/04/2021 1154   HGB 13.2 09/04/2021 1154   HCT 38.7 09/04/2021 1154   PLT 192 09/04/2021 1154   MCV 88.6 09/04/2021 1154   MCH 30.2 09/04/2021 1154   MCHC 34.1 09/04/2021 1154   RDW 14.3 09/04/2021  1154   LYMPHSABS 1.0 09/04/2021 1154   MONOABS 0.5 09/04/2021 1154   EOSABS 0.2 09/04/2021 1154   BASOSABS 0.1 09/04/2021 1154      Chest Imaging: 09/16/2021: Left upper lobe groundglass nodule, radiation changes to the left anterior chest. The patient's images have been independently reviewed by me.    10/01/2021 nuclear medicine pet imaging: No hypermetabolic uptake within the left upper lobe groundglass lesion. The patient's images have been independently reviewed by me.    Pulmonary Functions Testing Results:     View : No data to display.          FeNO:   Pathology:   Echocardiogram:   Heart Catheterization:     Assessment & Plan:     ICD-10-CM   1. Lung nodule  R91.1     2. Ground glass opacity present on imaging of lung  R91.8     3. History of COVID-19  Z86.16        Discussion:  This is a43 year old female, past medical history of breast cancer in October 2022.  Repeat CT imaging for surveillance with a left upper lobe groundglass opacity.  She does have a history of COVID-19 in December of this past year.  Approximately 6 weeks prior to imaging.  I suspect those groundglass opacities were related to COVID-19.  Repeat CT imaging completed in May showed resolution of groundglass opacities  Plan: Today in the office we reviewed her CT imaging as described above. She needs no additional follow-up at this time. She can follow-up with medical oncology for her regular surveillance imaging regarding breast cancer.    Current Outpatient Medications:    albuterol (VENTOLIN HFA) 108 (90 Base) MCG/ACT inhaler, Inhale 1-2 puffs into the lungs every 4 (four) hours as needed for wheezing or shortness of breath., Disp: 8 g, Rfl: 2   calcium carbonate (TUMS - DOSED IN MG ELEMENTAL CALCIUM) 500 MG chewable tablet, Chew 4 tablets by mouth as needed for indigestion or heartburn., Disp: , Rfl:    clonazePAM (KLONOPIN) 1 MG tablet, TAKE 1 TABLET BY MOUTH TWICE A DAY AS NEEDED FOR ANXIETY, Disp: 60 tablet, Rfl: 1   exemestane (AROMASIN) 25 MG tablet, Take 1 tablet (25 mg total) by mouth daily after  breakfast., Disp: 90 tablet, Rfl: 1   fluticasone (FLONASE) 50 MCG/ACT nasal spray, Place 1 spray into both nostrils daily as needed for allergies., Disp: , Rfl:    l-methylfolate-B6-B12 (METANX) 3-35-2 MG TABS tablet, Take 1 tablet by mouth daily., Disp: , Rfl:    LORazepam (ATIVAN) 0.5 MG tablet, TAKE 1 TABLET BY MOUTH THREE TIMES A DAY AS NEEDED, Disp: 40 tablet, Rfl: 0   MAGNESIUM CITRATE PO, Take 405 mg by mouth daily. 135 mg, Disp: , Rfl:    meloxicam (MOBIC) 7.5 MG tablet, Take 1 tablet (7.5 mg total) by mouth daily as needed for pain., Disp: 14 tablet, Rfl: 0   Multiple Vitamins-Minerals (MULTIVITAMIN WITH MINERALS) tablet, Take 1 tablet by mouth daily., Disp: , Rfl:    omeprazole (PRILOSEC) 20 MG capsule, Take 1 capsule (20 mg total) by mouth daily., Disp: 30 capsule, Rfl: 3   oxyCODONE (ROXICODONE) 5 MG immediate release tablet, Take 1 tablet (5 mg total) by mouth every 4 (four) hours as needed., Disp: 20 tablet, Rfl: 0   pseudoephedrine (SUDAFED) 30 MG tablet, Take 30 mg by mouth one time in dialysis., Disp: , Rfl:    sertraline (ZOLOFT) 100 MG tablet, TAKE 1 TABLET BY MOUTH EVERYDAY  AT BEDTIME, Disp: 90 tablet, Rfl: 1   Turmeric (CURCUMIN 95 PO), Take by mouth., Disp: , Rfl:    vitamin B-12 (CYANOCOBALAMIN) 100 MCG tablet, Take 100 mcg by mouth daily., Disp: , Rfl:     Garner Nash, DO [facility] 01/02/2022 9:18 AM

## 2022-02-02 ENCOUNTER — Telehealth: Payer: Self-pay | Admitting: Hematology and Oncology

## 2022-02-02 NOTE — Telephone Encounter (Signed)
Sch per 5/3 inbasket, unable to leave msg calendar mailed

## 2022-03-04 ENCOUNTER — Ambulatory Visit (HOSPITAL_COMMUNITY)
Admission: RE | Admit: 2022-03-04 | Discharge: 2022-03-04 | Disposition: A | Discharge status: Home / Self Care | Payer: BC Managed Care – PPO | Source: Ambulatory Visit | Attending: Adult Health | Admitting: Adult Health

## 2022-03-04 DIAGNOSIS — C50412 Malignant neoplasm of upper-outer quadrant of left female breast: Secondary | ICD-10-CM | POA: Insufficient documentation

## 2022-03-04 DIAGNOSIS — Z170 Estrogen receptor positive status [ER+]: Secondary | ICD-10-CM | POA: Insufficient documentation

## 2022-03-04 DIAGNOSIS — Z9889 Other specified postprocedural states: Secondary | ICD-10-CM | POA: Diagnosis not present

## 2022-03-04 MED ORDER — GADOBUTROL 1 MMOL/ML IV SOLN
9.5000 mL | Freq: Once | INTRAVENOUS | Status: AC | PRN
  Administered 2022-03-04: 9.5 mL via INTRAVENOUS

## 2022-03-06 ENCOUNTER — Telehealth: Payer: Self-pay

## 2022-03-06 NOTE — Telephone Encounter (Signed)
Called pt, relayed message of MRI negative for cancer.  Pt verbalized understanding

## 2022-03-06 NOTE — Telephone Encounter (Signed)
-----   Message from [name], NP sent at 03/06/2022  9:58 AM EDT ----- Please let patient know screening mri is neg for cancer.  ----- Message ----- From: Interface, Rad Results In Sent: 03/05/2022   1:43 PM EDT To: [name], NP

## 2022-03-09 ENCOUNTER — Encounter: Payer: Self-pay | Admitting: Adult Health

## 2022-03-09 ENCOUNTER — Telehealth: Payer: Self-pay

## 2022-03-09 NOTE — Telephone Encounter (Signed)
S/W pt regarding mychart message about joint stiffness/pain/swelling. Pt was advised per MD to stop exemestane, and f/u in office with NP to discuss alternatives. Pt was offer appt with Wilber Bihari, NP 03/31/22 at 0915. Pt accepted appt and knows to stop the medication. She verbalized thanks and understanding.

## 2022-03-18 ENCOUNTER — Inpatient Hospital Stay: Payer: BC Managed Care – PPO | Admitting: Adult Health

## 2022-03-19 ENCOUNTER — Inpatient Hospital Stay: Payer: BC Managed Care – PPO

## 2022-03-19 ENCOUNTER — Ambulatory Visit (HOSPITAL_COMMUNITY)
Admission: RE | Admit: 2022-03-19 | Discharge: 2022-03-19 | Disposition: A | Discharge status: Home / Self Care | Payer: BC Managed Care – PPO | Source: Ambulatory Visit | Attending: Adult Health | Admitting: Adult Health

## 2022-03-19 ENCOUNTER — Telehealth: Payer: Self-pay | Admitting: *Deleted

## 2022-03-19 ENCOUNTER — Other Ambulatory Visit: Payer: Self-pay

## 2022-03-19 ENCOUNTER — Inpatient Hospital Stay: Payer: BC Managed Care – PPO | Attending: Adult Health | Admitting: Adult Health

## 2022-03-19 VITALS — BP 130/69 | HR 81 | Temp 97.9°F | Resp 18 | Ht 66.0 in | Wt 217.3 lb

## 2022-03-19 DIAGNOSIS — R5383 Other fatigue: Secondary | ICD-10-CM | POA: Diagnosis not present

## 2022-03-19 DIAGNOSIS — I341 Nonrheumatic mitral (valve) prolapse: Secondary | ICD-10-CM | POA: Diagnosis not present

## 2022-03-19 DIAGNOSIS — Z9221 Personal history of antineoplastic chemotherapy: Secondary | ICD-10-CM | POA: Insufficient documentation

## 2022-03-19 DIAGNOSIS — Z923 Personal history of irradiation: Secondary | ICD-10-CM | POA: Insufficient documentation

## 2022-03-19 DIAGNOSIS — C50412 Malignant neoplasm of upper-outer quadrant of left female breast: Secondary | ICD-10-CM

## 2022-03-19 DIAGNOSIS — M2559 Pain in other specified joint: Secondary | ICD-10-CM

## 2022-03-19 DIAGNOSIS — D6851 Activated protein C resistance: Secondary | ICD-10-CM | POA: Insufficient documentation

## 2022-03-19 DIAGNOSIS — M2550 Pain in unspecified joint: Secondary | ICD-10-CM | POA: Diagnosis not present

## 2022-03-19 DIAGNOSIS — Z8679 Personal history of other diseases of the circulatory system: Secondary | ICD-10-CM | POA: Diagnosis not present

## 2022-03-19 DIAGNOSIS — R635 Abnormal weight gain: Secondary | ICD-10-CM | POA: Insufficient documentation

## 2022-03-19 DIAGNOSIS — M25551 Pain in right hip: Secondary | ICD-10-CM | POA: Insufficient documentation

## 2022-03-19 DIAGNOSIS — Z8041 Family history of malignant neoplasm of ovary: Secondary | ICD-10-CM | POA: Insufficient documentation

## 2022-03-19 DIAGNOSIS — Z853 Personal history of malignant neoplasm of breast: Secondary | ICD-10-CM | POA: Insufficient documentation

## 2022-03-19 DIAGNOSIS — R600 Localized edema: Secondary | ICD-10-CM | POA: Diagnosis not present

## 2022-03-19 DIAGNOSIS — M25552 Pain in left hip: Secondary | ICD-10-CM | POA: Diagnosis not present

## 2022-03-19 DIAGNOSIS — Z803 Family history of malignant neoplasm of breast: Secondary | ICD-10-CM | POA: Diagnosis not present

## 2022-03-19 DIAGNOSIS — M7989 Other specified soft tissue disorders: Secondary | ICD-10-CM | POA: Diagnosis not present

## 2022-03-19 DIAGNOSIS — Z170 Estrogen receptor positive status [ER+]: Secondary | ICD-10-CM

## 2022-03-19 DIAGNOSIS — Z801 Family history of malignant neoplasm of trachea, bronchus and lung: Secondary | ICD-10-CM | POA: Diagnosis not present

## 2022-03-19 DIAGNOSIS — Z79811 Long term (current) use of aromatase inhibitors: Secondary | ICD-10-CM | POA: Insufficient documentation

## 2022-03-19 LAB — CMP (CANCER CENTER ONLY)
ALT: 45 U/L — ABNORMAL HIGH (ref 0–44)
AST: 37 U/L (ref 15–41)
Albumin: 4.3 g/dL (ref 3.5–5.0)
Alkaline Phosphatase: 87 U/L (ref 38–126)
Anion gap: 9 (ref 5–15)
BUN: 12 mg/dL (ref 6–20)
CO2: 27 mmol/L (ref 22–32)
Calcium: 9.4 mg/dL (ref 8.9–10.3)
Chloride: 104 mmol/L (ref 98–111)
Creatinine: 0.88 mg/dL (ref 0.44–1.00)
GFR, Estimated: >60 mL/min (ref >60)
Glucose, Bld: 89 mg/dL (ref 70–99)
Potassium: 4.1 mmol/L (ref 3.5–5.1)
Sodium: 140 mmol/L (ref 135–145)
Total Bilirubin: 0.5 mg/dL (ref 0.3–1.2)
Total Protein: 7.5 g/dL (ref 6.5–8.1)

## 2022-03-19 LAB — CBC WITH DIFFERENTIAL (CANCER CENTER ONLY)
Abs Immature Granulocytes: 0.02 10*3/uL (ref 0.00–0.07)
Basophils Absolute: 0.1 10*3/uL (ref 0.0–0.1)
Basophils Relative: 1 %
Eosinophils Absolute: 0.1 10*3/uL (ref 0.0–0.5)
Eosinophils Relative: 3 %
HCT: 40.6 % (ref 36.0–46.0)
Hemoglobin: 14.6 g/dL (ref 12.0–15.0)
Immature Granulocytes: 0 %
Lymphocytes Relative: 29 %
Lymphs Abs: 1.4 10*3/uL (ref 0.7–4.0)
MCH: 31.7 pg (ref 26.0–34.0)
MCHC: 36 g/dL (ref 30.0–36.0)
MCV: 88.3 fL (ref 80.0–100.0)
Monocytes Absolute: 0.4 10*3/uL (ref 0.1–1.0)
Monocytes Relative: 9 %
Neutro Abs: 2.8 10*3/uL (ref 1.7–7.7)
Neutrophils Relative %: 58 %
Platelet Count: 209 10*3/uL (ref 150–400)
RBC: 4.6 MIL/uL (ref 3.87–5.11)
RDW: 13.7 % (ref 11.5–15.5)
WBC Count: 4.9 10*3/uL (ref 4.0–10.5)
nRBC: 0 % (ref 0.0–0.2)

## 2022-03-19 LAB — C-REACTIVE PROTEIN: CRP: 0.7 mg/dL (ref <1.0)

## 2022-03-19 LAB — TSH: TSH: 1.588 u[IU]/mL (ref 0.350–4.500)

## 2022-03-19 LAB — SEDIMENTATION RATE: Sed Rate: 9 mm/hr (ref 0–22)

## 2022-03-19 LAB — MAGNESIUM: Magnesium: 2.2 mg/dL (ref 1.7–2.4)

## 2022-03-19 LAB — VITAMIN B12: Vitamin B-12: 367 pg/mL (ref 180–914)

## 2022-03-19 LAB — VITAMIN D 25 HYDROXY (VIT D DEFICIENCY, FRACTURES): Vit D, 25-Hydroxy: 38.43 ng/mL (ref 30–100)

## 2022-03-19 LAB — IRON AND IRON BINDING CAPACITY (CC-WL,HP ONLY)
Iron: 99 ug/dL (ref 28–170)
Saturation Ratios: 22 % (ref 10.4–31.8)
TIBC: 461 ug/dL — ABNORMAL HIGH (ref 250–450)
UIBC: 362 ug/dL (ref 148–442)

## 2022-03-19 LAB — FERRITIN: Ferritin: 61 ng/mL (ref 11–307)

## 2022-03-19 LAB — BRAIN NATRIURETIC PEPTIDE: B Natriuretic Peptide: 21.4 pg/mL (ref 0.0–100.0)

## 2022-03-19 MED ORDER — FUROSEMIDE 20 MG PO TABS: 20.0000 mg | ORAL_TABLET | Freq: Every day | ORAL | 0 refills | Status: DC | PRN

## 2022-03-19 NOTE — Progress Notes (Signed)
[facility] Follow up:    Tracy Pepper, MD [place] [address] [address]   DIAGNOSIS:  Cancer Staging  Malignant neoplasm of upper-outer quadrant of left breast in female, estrogen receptor positive ([place]) Staging form: Breast, AJCC 8th Edition - Clinical stage from 08/21/2020: Stage IIA (cT1c, cN1, cM0, G3, ER+, PR+, HER2-) - Signed by [name], MD on 08/21/2020 - Pathologic stage from 02/26/2021: No Stage Recommended (ypT0, pN0, cM0) - Signed by [name], NP on 12/04/2021 Stage prefix: Post-therapy   SUMMARY OF ONCOLOGIC HISTORY: Oncology History  Malignant neoplasm of upper-outer quadrant of left breast in female, estrogen receptor positive ([place])  08/21/2020 Initial Diagnosis   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 No pathogenic variants identified on the [facility] Breast Cancer STAT Panel + Multi-Cancer Panel. [facility] in [facility] called c.983C>T identified. The report date is 09/04/2020.   The STAT Breast cancer panel offered by [facility] includes sequencing and rearrangement analysis for the following 9 genes:  ATM, BRCA1, BRCA2, CDH1, CHEK2, PALB2, PTEN, STK11 and TP53.    The Multi-Cancer Panel offered by [facility] includes sequencing and/or deletion duplication testing of the following 85 genes: AIP, ALK, APC, ATM, AXIN2,BAP1,  BARD1, BLM, BMPR1A, BRCA1,  BRCA2, BRIP1, CASR, CDC73, CDH1, CDK4, CDKN1B, CDKN1C, CDKN2A (p14ARF), CDKN2A (p16INK4a), CEBPA, CHEK2, CTNNA1, DICER1, DIS3L2, EGFR (c.2369C>T, p.Thr790Met variant only), EPCAM (Deletion/duplication testing only), FH, FLCN, GATA2, GPC3, GREM1 (Promoter region deletion/duplication testing only), HOXB13 (c.251G>A, p.Gly84Glu), HRAS, KIT, MAX, MEN1, MET, MITF (c.952G>A, p.Glu318Lys variant only), MLH1, MSH2, MSH3, MSH6, MUTYH, NBN, NF1, NF2, NTHL1, PALB2, PDGFRA, PHOX2B, PMS2, POLD1, POLE, POT1, PRKAR1A, PTCH1, PTEN, RAD50, RAD51C, RAD51D, RB1, RECQL4, RET, RNF43, RUNX1, SDHAF2, SDHA (sequence changes only), SDHB, SDHC, SDHD, SMAD4, SMARCA4, SMARCB1, SMARCE1, STK11, SUFU, TERC, TERT, TMEM127, TP53, TSC1, TSC2, VHL, WRN and WT1.    09/06/2020 - 01/17/2021 Chemotherapy   AC X 4 foll by Taxol       02/26/2021 Surgery   Left lumpectomy: no residual carcinoma with margins and axillary lymph nodes also negative for carcinoma     02/26/2021 Cancer Staging   Staging form: Breast, AJCC 8th Edition - Pathologic stage from 02/26/2021: No Stage Recommended (ypT0, pN0, cM0) - Signed by [name], NP on 12/04/2021 Stage prefix: Post-therapy   04/18/2021 Surgery   Removal of retained clip, port removal and bilateral breast mammoplasty    05/27/2021 - 07/10/2021 Radiation Therapy    Site Technique Total Dose (Gy) Dose per Fx (Gy) Completed Fx Beam Energies  Breast, Left: Breast_Lt 3D 50.4/50.4 1.8 28/28 6X, 10X  Axilla, Left: Axilla_Lt 3D 50.4/50.4 1.8 28/28 6X, 15X    08/2021 -  Anti-estrogen oral therapy   Unable to tolerate tamoxifen or letrozole, changed to exemestane daily 09/18/21     CURRENT THERAPY: Exemestane  INTERVAL HISTORY: Tracy Dyer 51 y.o. female returns for urgent evaluation of left first digit swelling and discomfort  that radiates down her left arm into her finger.  She underwent breast MRI about 2 weeks ago that showed no evidence of malignancy.  She notes that the nurse  used her left arm for an IV and the insertion was somewhat traumatic.    Tracy Dyer notes she has achiness, fatigue, and weight gain since starting the Exemestane.  She has previously tried Letrozole and is unable to take Tamoxifen due to her factor V leiden.    Tracy Dyer is tearful and concerned about how poorly she feels and tells me it is much worse than the previous chemotherapy she received and its associated side effects.  Patient Active Problem List   Diagnosis Date Noted   Occipital neuralgia of right side 07/01/2021   Peripheral vertigo 07/01/2021   Genetic testing 09/04/2020   Family history of breast cancer    Family history of ovarian cancer    Family history of lung cancer    Malignant neoplasm of upper-outer quadrant of left breast in female, estrogen receptor positive ([place]) 08/21/2020   Anxiety 11/16/2013   Hypercoagulable state ([place]) 11/16/2013   TIA (transient ischemic attack) 11/15/2013   Chest discomfort 01/20/2012   Factor V Leiden ([place]) 01/20/2012   MVP (mitral valve prolapse) 01/20/2012   Acute bronchitis 12/23/2011    is allergic to sulfa antibiotics and sulfamethoxazole.  MEDICAL HISTORY: Past Medical History:  Diagnosis Date   Anxiety    Cancer ([place])    breast   Depression    Family history of breast cancer    Family history of lung cancer    Family history of ovarian cancer    GERD (gastroesophageal reflux disease)    diet controlled   History of radiation therapy    Left breast, left axilla- 05/27/21-07/10/21- Dr. [name]   Seasonal allergies     SURGICAL HISTORY: Past Surgical History:  Procedure Laterality Date   BREAST BIOPSY Left 2019   x 2 biopsy   BREAST EXCISIONAL BIOPSY Left    BREAST LUMPECTOMY WITH RADIOACTIVE SEED AND SENTINEL LYMPH NODE BIOPSY Left 02/26/2021   Procedure: LEFT BREAST LUMPECTOMY WITH RADIOACTIVE SEED x2; LEFT AXILLARY SENTINEL NODE BIOPSY;  Surgeon: [name], MD;  Location: [place];   Service: General;  Laterality: Left;   BREAST LUMPECTOMY WITH RADIOFREQUENCY TAG IDENTIFICATION Left 1/44/8185   Procedure: LEFT BREAST MRI WIRE GUIDED EXCISION;  Surgeon: [name], MD;  Location: [place];  Service: General;  Laterality: Left;   BREAST REDUCTION SURGERY Bilateral 04/18/2021   Procedure: MAMMARY REDUCTION  (BREAST) LEFT ONCOPLASTIC BREAST RECONSTRUCTION, RIGHT BREAST REDUCTION;  Surgeon: Irene Limbo, MD;  Location: [place];  Service: Plastics;  Laterality: Bilateral;   CHOLECYSTECTOMY  2008   PORT-A-CATH REMOVAL Right 04/18/2021   Procedure: REMOVAL PORT-A-CATH;  Surgeon: [name], MD;  Location: [place];  Service: General;  Laterality: Right;   PORTACATH PLACEMENT N/A 09/05/2020   Procedure: INSERTION PORT-A-CATH;  Surgeon: [name], MD;  Location: [place];  Service: General;  Laterality: N/A;  PEC BLOCK   WISDOM TOOTH EXTRACTION      SOCIAL HISTORY: Social History   Socioeconomic History   Marital status: Married    Spouse name: Not on file   Number of children: Not on file   Years of education: Not on file   Highest education level: Not on file  Occupational History   Not on file  Tobacco Use   Smoking status: Never   Smokeless tobacco: Never  Vaping Use   Vaping Use: Never used  Substance and Sexual Activity   Alcohol use: Not Currently   Drug use: Never   Sexual activity: Yes    Birth control/protection: None  Other Topics Concern   Not on file  Social History Narrative   Not on file   Social Determinants of Health   Financial Resource Strain: Not on file  Food Insecurity: No Food Insecurity (09/06/2020)   Hunger Vital Sign    Worried About Running Out of Food in the Last Year: Never true    [place] in the Last Year: Never true  Transportation Needs: No Transportation Needs (09/06/2020)   PRAPARE - Hydrologist (Medical): No    Lack  of Transportation (Non-Medical): No  Physical Activity: Not on file  Stress: Not on file  Social Connections: Not on file  Intimate Partner Violence: Not on file    FAMILY HISTORY: Family History  Problem Relation Age of Onset   Eczema Father    Allergic rhinitis Brother    Breast cancer Mother        in 65's   Cancer Maternal Uncle        unk type   Lung cancer Maternal Grandfather 42   Heart Problems Paternal Grandfather    Melanoma Cousin    Ovarian cancer Paternal Great-grandmother    Cancer Paternal Great-grandmother        GYN cancer, possibly ovarian?   Cancer Maternal Aunt        half aunts/uncles x5- rare cancers    Review of Systems  Constitutional:  Positive for fatigue. Negative for appetite change, chills, fever and unexpected weight change.  HENT:   Negative for hearing loss, lump/mass and trouble swallowing.   Eyes:  Negative for eye problems and icterus.  Respiratory:  Negative for chest tightness, cough and shortness of breath.   Cardiovascular:  Negative for chest pain, leg swelling and palpitations.  Gastrointestinal:  Negative for abdominal distention, abdominal pain, constipation, diarrhea, nausea and vomiting.  Endocrine: Negative for hot flashes.  Genitourinary:  Negative for difficulty urinating.   Musculoskeletal:  Positive for arthralgias.  Skin:  Negative for itching and rash.  Neurological:  Negative for dizziness, extremity weakness, headaches and numbness.  Hematological:  Negative for adenopathy. Does not bruise/bleed easily.  Psychiatric/Behavioral:  Negative for depression. The patient is not nervous/anxious.       PHYSICAL EXAMINATION  ECOG PERFORMANCE STATUS: 2 - Symptomatic, <50% confined to bed  Vitals:   03/19/22 1222  BP: 130/69  Pulse: 81  Resp: 18  Temp: 97.9 F (36.6 C)  SpO2: 96%    Physical Exam Constitutional:      General: She is not in acute distress.    Appearance: Normal appearance. She is not  toxic-appearing.  HENT:     Head: Normocephalic and atraumatic.  Eyes:     General: No scleral icterus. Cardiovascular:     Rate and Rhythm: Normal rate and regular rhythm.     Pulses: Normal pulses.     Heart sounds: Normal heart sounds.  Pulmonary:     Effort: Pulmonary effort is normal.     Breath sounds: Normal breath sounds.  Abdominal:     General: Abdomen is flat. Bowel sounds are normal. There is no distension.     Palpations: Abdomen is soft.     Tenderness: There is no abdominal tenderness.  Musculoskeletal:        General:  Swelling present.     Cervical back: Neck supple.     Comments: Right first digit into her hand is swollen, slight forearm swelling.  No lymphadenopathy noted.  Lymphadenopathy:     Cervical: No cervical adenopathy.  Skin:    General: Skin is warm and dry.     Findings: No rash.  Neurological:     General: No focal deficit present.     Mental Status: She is alert.  Psychiatric:        Mood and Affect: Mood normal.        Behavior: Behavior normal.     LABORATORY DATA:  None completed by appointment completion   ASSESSMENT and THERAPY PLAN:   Malignant neoplasm of upper-outer quadrant of left breast in female, estrogen receptor positive ([place]) Tracy Dyer is a 51 year old woman with stage IIA estrogen positive breast cancer s/p neoadjuvant chemotherapy, lumpectomy, adjuvant radiation, and antiestrogen therapy with aromatase inhibitors.   Tracy Dyer is here today for urgent evaluation of finger swelling, fatigue and genratlzed achiness.  Finger swelling: will get xray, obtain bnp.  She has a 12 pound weight gain.  Considering her level of fatigue, weight gain, previous receipt of adriamycin, and h/o mitral valve prolapse, I ordered repeat echo.  I sent in prn lasix to help with her swelling.    I have ordered additional labs to identify other potential etiologies of her fatigue.  She and I discussed the exemestane in detail.  Considering the level of side  effects that she is experiencing, I do not think she is going to be able to tolerate therapy.  She and I discussed modifiable risk reduction with increased healthy diet, exercise, and weight loss.    The above was reviewed with Dr. Lindi Adie who is in agreement.  Once her imaging and labs return we will decide whether she should see PT.     All questions were answered. The patient knows to call the clinic with any problems, questions or concerns. We can certainly see the patient much sooner if necessary.  Total encounter time:30 minutes*in face-to-face visit time, chart review, lab review, care coordination, order entry, and documentation of the encounter time.    Wilber Bihari, NP 03/20/22 12:20 AM Medical Oncology and Hematology [facility] [place], [place] [address] Tel. [phone]    Fax. [phone]  *Total Encounter Time as defined by the Centers for Medicare and Medicaid Services includes, in addition to the face-to-face time of a patient visit (documented in the note above) non-face-to-face time: obtaining and reviewing outside history, ordering and reviewing medications, tests or procedures, care coordination (communications with other health care professionals or caregivers) and documentation in the medical record.

## 2022-03-19 NOTE — Telephone Encounter (Signed)
Per Lindsey Causey,NP, called pt with message below. Pt verbalized understanding.  

## 2022-03-19 NOTE — Telephone Encounter (Signed)
-----   Message from [name], NP sent at 03/19/2022  3:18 PM EDT ----- Tracy Dyer shows some soft tissue swelling, nothing in the joints or bones of the hand, let her know we are waiting on labs she had done and then will decide on sending her for PT ----- Message ----- From: Interface, Rad Results In Sent: 03/19/2022   2:12 PM EDT To: [name], NP

## 2022-03-20 ENCOUNTER — Encounter: Payer: Self-pay | Admitting: Adult Health

## 2022-03-20 LAB — ANTINUCLEAR ANTIBODIES, IFA: ANA Ab, IFA: NEGATIVE

## 2022-03-20 NOTE — Assessment & Plan Note (Signed)
Tracy Dyer is a 51 year old woman with stage IIA estrogen positive breast cancer s/p neoadjuvant chemotherapy, lumpectomy, adjuvant radiation, and antiestrogen therapy with aromatase inhibitors.   Tracy Dyer is here today for urgent evaluation of finger swelling, fatigue and genratlzed achiness.  Finger swelling: will get xray, obtain bnp.  She has a 12 pound weight gain.  Considering her level of fatigue, weight gain, previous receipt of adriamycin, and h/o mitral valve prolapse, I ordered repeat echo.  I sent in prn lasix to help with her swelling.    I have ordered additional labs to identify other potential etiologies of her fatigue.  She and I discussed the exemestane in detail.  Considering the level of side effects that she is experiencing, I do not think she is going to be able to tolerate therapy.  She and I discussed modifiable risk reduction with increased healthy diet, exercise, and weight loss.    The above was reviewed with Dr. Lindi Adie who is in agreement.  Once her imaging and labs return we will decide whether she should see PT.

## 2022-03-24 ENCOUNTER — Other Ambulatory Visit: Payer: Self-pay

## 2022-03-24 ENCOUNTER — Telehealth: Payer: Self-pay

## 2022-03-24 ENCOUNTER — Ambulatory Visit (HOSPITAL_COMMUNITY)
Admission: RE | Admit: 2022-03-24 | Discharge: 2022-03-24 | Disposition: A | Discharge status: Home / Self Care | Payer: BC Managed Care – PPO | Source: Ambulatory Visit | Attending: Adult Health | Admitting: Adult Health

## 2022-03-24 ENCOUNTER — Ambulatory Visit (HOSPITAL_BASED_OUTPATIENT_CLINIC_OR_DEPARTMENT_OTHER)
Admission: RE | Admit: 2022-03-24 | Discharge: 2022-03-24 | Disposition: A | Discharge status: Home / Self Care | Payer: BC Managed Care – PPO | Source: Ambulatory Visit | Attending: Hematology and Oncology | Admitting: Hematology and Oncology

## 2022-03-24 DIAGNOSIS — Z79811 Long term (current) use of aromatase inhibitors: Secondary | ICD-10-CM | POA: Insufficient documentation

## 2022-03-24 DIAGNOSIS — I82409 Acute embolism and thrombosis of unspecified deep veins of unspecified lower extremity: Secondary | ICD-10-CM | POA: Diagnosis not present

## 2022-03-24 DIAGNOSIS — I341 Nonrheumatic mitral (valve) prolapse: Secondary | ICD-10-CM | POA: Diagnosis not present

## 2022-03-24 DIAGNOSIS — C50412 Malignant neoplasm of upper-outer quadrant of left female breast: Secondary | ICD-10-CM | POA: Diagnosis not present

## 2022-03-24 DIAGNOSIS — M7989 Other specified soft tissue disorders: Secondary | ICD-10-CM | POA: Insufficient documentation

## 2022-03-24 DIAGNOSIS — Z170 Estrogen receptor positive status [ER+]: Secondary | ICD-10-CM | POA: Diagnosis not present

## 2022-03-24 DIAGNOSIS — C50919 Malignant neoplasm of unspecified site of unspecified female breast: Secondary | ICD-10-CM | POA: Diagnosis not present

## 2022-03-24 DIAGNOSIS — D6851 Activated protein C resistance: Secondary | ICD-10-CM | POA: Diagnosis not present

## 2022-03-24 DIAGNOSIS — I340 Nonrheumatic mitral (valve) insufficiency: Secondary | ICD-10-CM | POA: Diagnosis not present

## 2022-03-24 LAB — ECHOCARDIOGRAM COMPLETE
Area-P 1/2: 3.48 cm2
S' Lateral: 2.9 cm

## 2022-03-24 NOTE — Telephone Encounter (Signed)
-----   Message from [name], NP sent at 03/21/2022  2:43 PM EDT ----- Please let Tracy Dyer know that her labs are all good.  No autoimmune issues.  Her b12 is on the low side of normal, so I do recommend b12 1095mg sublingual.  I would recommend referring her to PT if she is agreeable to see them for left arm swelling.  Can you place the order if so?  Thanks, LC ----- Message ----- From: [facility] In SHartsSent: 03/19/2022   1:59 PM EDT To: LGardenia Phlegm NP

## 2022-03-24 NOTE — Progress Notes (Signed)
Order for LUE vas [place] entered per MD.

## 2022-03-24 NOTE — Progress Notes (Signed)
  Echocardiogram 2D Echocardiogram has been performed.  Darlina Sicilian M 03/24/2022, 10:48 AM

## 2022-03-24 NOTE — Telephone Encounter (Signed)
Patient notified of the following message regarding recent lab values and recommendations. Patient verbalized an understanding of the information. Patient noted that she would be agreeable to start PT, due to ongoing left arm swelling. Dr. Lindi Adie clarified that he would like patient to undergo doppler of the left arm prior to PT referral. Stat doppler order placed and patient scheduled for appointment today at 3:30 PM at [facility]. Patient verbalized an understanding of the information.

## 2022-03-26 ENCOUNTER — Encounter: Payer: Self-pay | Admitting: Physical Therapy

## 2022-03-26 ENCOUNTER — Other Ambulatory Visit: Payer: Self-pay

## 2022-03-26 ENCOUNTER — Ambulatory Visit: Payer: BC Managed Care – PPO | Attending: Hematology and Oncology | Admitting: Physical Therapy

## 2022-03-26 DIAGNOSIS — Z170 Estrogen receptor positive status [ER+]: Secondary | ICD-10-CM | POA: Insufficient documentation

## 2022-03-26 DIAGNOSIS — Z483 Aftercare following surgery for neoplasm: Secondary | ICD-10-CM | POA: Diagnosis not present

## 2022-03-26 DIAGNOSIS — C50412 Malignant neoplasm of upper-outer quadrant of left female breast: Secondary | ICD-10-CM | POA: Diagnosis not present

## 2022-03-26 DIAGNOSIS — R293 Abnormal posture: Secondary | ICD-10-CM | POA: Diagnosis not present

## 2022-03-26 DIAGNOSIS — R600 Localized edema: Secondary | ICD-10-CM | POA: Insufficient documentation

## 2022-03-26 NOTE — Therapy (Signed)
OUTPATIENT PHYSICAL THERAPY ONCOLOGY EVALUATION  Patient Name: Tracy Dyer MRN: [id number] DOB:10-14-1970, 51 y.o., female Today's Date: 03/26/2022   PT End of Session - 03/26/22 0920     Visit Number 1    Number of Visits 9    Date for PT Re-Evaluation 04/23/22    PT Start Time 7482   pt arrived late   PT Stop Time 0908    PT Time Calculation (min) 52 min    Activity Tolerance Patient tolerated treatment well    Behavior During Therapy [facility] for tasks assessed/performed             Past Medical History:  Diagnosis Date   Anxiety    Cancer ([place])    breast   Depression    Family history of breast cancer    Family history of lung cancer    Family history of ovarian cancer    GERD (gastroesophageal reflux disease)    diet controlled   History of radiation therapy    Left breast, left axilla- 05/27/21-07/10/21- Dr. [name]   Seasonal allergies    Past Surgical History:  Procedure Laterality Date   BREAST BIOPSY Left 2019   x 2 biopsy   BREAST EXCISIONAL BIOPSY Left    BREAST LUMPECTOMY WITH RADIOACTIVE SEED AND SENTINEL LYMPH NODE BIOPSY Left 02/26/2021   Procedure: LEFT BREAST LUMPECTOMY WITH RADIOACTIVE SEED x2; LEFT AXILLARY SENTINEL NODE BIOPSY;  Surgeon: [name], MD;  Location: [place];  Service: General;  Laterality: Left;   BREAST LUMPECTOMY WITH RADIOFREQUENCY TAG IDENTIFICATION Left 02/06/8674   Procedure: LEFT BREAST MRI WIRE GUIDED EXCISION;  Surgeon: [name], MD;  Location: [place];  Service: General;  Laterality: Left;   BREAST REDUCTION SURGERY Bilateral 04/18/2021   Procedure: MAMMARY REDUCTION  (BREAST) LEFT ONCOPLASTIC BREAST RECONSTRUCTION, RIGHT BREAST REDUCTION;  Surgeon: Irene Limbo, MD;  Location: [place];  Service: Plastics;  Laterality: Bilateral;   CHOLECYSTECTOMY  2008   PORT-A-CATH REMOVAL Right 04/18/2021   Procedure: REMOVAL PORT-A-CATH;  Surgeon: [name], MD;  Location: [place];  Service: General;  Laterality: Right;   PORTACATH PLACEMENT N/A 09/05/2020   Procedure: INSERTION PORT-A-CATH;  Surgeon: [name], MD;  Location: [facility] OR;  Service: General;  Laterality: N/A;  [place] EXTRACTION     Patient Active Problem List   Diagnosis Date Noted   Occipital neuralgia of right side 07/01/2021   Peripheral vertigo 07/01/2021   Genetic testing 09/04/2020   Family history of breast cancer    Family history of ovarian cancer    Family history of lung cancer    Malignant neoplasm of upper-outer quadrant of left breast in female, estrogen receptor positive ([place]) 08/21/2020   Anxiety 11/16/2013   Hypercoagulable state ([place]) 11/16/2013   TIA (transient ischemic attack) 11/15/2013   Chest discomfort 01/20/2012   Factor V Leiden ([place]) 01/20/2012   MVP (mitral valve prolapse) 01/20/2012   Acute bronchitis 12/23/2011    PCP: [name], MD  REFERRING PROVIDER: [name], MD   REFERRING DIAG: [phone] (ICD-10-CM) - Malignant neoplasm of upper-outer quadrant of left breast in female, estrogen receptor positive ([place])  THERAPY DIAG:  Localized edema  Aftercare following surgery for neoplasm  Abnormal posture  Malignant neoplasm of upper-outer quadrant of left breast in female, estrogen receptor positive ([place])  ONSET DATE: 03/03/22  Rationale for Evaluation and Treatment Rehabilitation  SUBJECTIVE  SUBJECTIVE STATEMENT: I had left sided swelling that included my hand, my face and my foot. I thought something was going on with my heart. I had increased pain in my back. They did try to draw from my left arm before the swelling started. I think it may have been a side effect from the exemestane which I have  now decided to stop. Pt also reports having cording during this time in her LUE.   PERTINENT HISTORY:   Lt lumpectomy with SLNB 0/4 LN on 02/26/21, chemotherapy and radiation completed ; ER/PR+, HER2- PAIN:  Are you having pain? Yes NPRS scale: 2/10 Pain location: hand Pain orientation: Left  PAIN TYPE: discomfort Pain description: constant  Aggravating factors: trying to carry something, something tight on arm Relieving factors: nothing  PRECAUTIONS: Other: LUE lymphedema risk  WEIGHT BEARING RESTRICTIONS No  FALLS:  Has patient fallen in last 6 months? No  LIVING ENVIRONMENT: Lives with: lives with their family Lives in: House/apartment Has following equipment at home: None  OCCUPATION: full time, designer  LEISURE: pt is walking 30 min/5 days per week, swimming  HAND DOMINANCE : right   PRIOR LEVEL OF FUNCTION: Independent  PATIENT GOALS to get rid of swelling in the hand   OBJECTIVE  COGNITION:  Overall cognitive status: Within functional limits for tasks assessed   PALPATION: Some cording palpable in L axilla, pt reports deep cording extending down in to forearm, therapist unable to palpate today but pt reports it comes and goes  OBSERVATIONS / OTHER ASSESSMENTS: visible swelling in left hand especially at 2nd digit  POSTURE: forward head and rounded shoulders  UPPER EXTREMITY AROM/PROM:  A/PROM RIGHT   eval   Shoulder extension 68  Shoulder flexion 160  Shoulder abduction 160  Shoulder internal rotation   Shoulder external rotation     (Blank rows = not tested)  A/PROM LEFT   eval  Shoulder extension 70  Shoulder flexion 166  Shoulder abduction 173  Shoulder internal rotation   Shoulder external rotation     (Blank rows = not tested)   LYMPHEDEMA ASSESSMENTS:   SURGERY TYPE/DATE: Left lumpectomy and SLNB 02/26/21  NUMBER OF LYMPH NODES REMOVED: 0/4  CHEMOTHERAPY: completed  RADIATION: completed  HORMONE TREATMENT: had to stop due to  side effects  INFECTIONS: none reported   LYMPHEDEMA ASSESSMENTS:   LANDMARK RIGHT  eval  10 cm proximal to olecranon process 33.2  Olecranon process 26.8  10 cm proximal to ulnar styloid process 24  Just proximal to ulnar styloid process 16.5  Across hand at thumb web space 19.9  At base of 2nd digit 6.4  (Blank rows = not tested)  LANDMARK LEFT  eval  10 cm proximal to olecranon process 34.5  Olecranon process 26.7  10 cm proximal to ulnar styloid process 24.1  Just proximal to ulnar styloid process 16.3  Across hand at thumb web space 20  At base of 2nd digit 7  (Blank rows = not tested)     QUICK DASH SURVEY:   Katina Dung - 03/26/22 0001     Open a tight or new jar Moderate difficulty    Do heavy household chores (wash walls, wash floors) Mild difficulty    Carry a shopping bag or briefcase Severe difficulty    Wash your back No difficulty    Use a knife to cut food No difficulty    Recreational activities in which you take some force or impact through your arm, shoulder, or hand (  golf, hammering, tennis) Moderate difficulty    During the past week, to what extent has your arm, shoulder or hand problem interfered with your normal social activities with family, friends, neighbors, or groups? Modererately    During the past week, to what extent has your arm, shoulder or hand problem limited your work or other regular daily activities Slightly    Arm, shoulder, or hand pain. Moderate    Tingling (pins and needles) in your arm, shoulder, or hand None    Difficulty Sleeping Mild difficulty    DASH Score 31.82 %              L-DEX LYMPHEDEMA SCREENING Measurement Type: Unilateral L-DEX MEASUREMENT EXTREMITY: Upper Extremity POSITION : Standing DOMINANT SIDE: Right At Risk Side: Left BASELINE SCORE (UNILATERAL): -0.3 L-DEX SCORE (UNILATERAL): 2.4 VALUE CHANGE (UNILAT): 2.7  The patient was assessed using the L-Dex machine today to produce a lymphedema index  baseline score. The patient will be reassessed on a regular basis (typically every 3 months) to obtain new L-Dex scores. If the score is > 6.5 points away from his/her baseline score indicating onset of subclinical lymphedema, it will be recommended to wear a compression garment for 4 weeks, 12 hours per day and then be reassessed. If the score continues to be > 6.5 points from baseline at reassessment, we will initiate lymphedema treatment. Assessing in this manner has a 95% rate of preventing clinically significant lymphedema.   TODAY'S TREATMENT  03/26/22: Compression bandaging to hand as follows (done by Shan Levans, PT) stockinette, then artiflex to cover the hand and wrist, then Elastomull to fingers 1-5, and then 1 6cm hand bandage.  Brief instruction given to patient on self bandaging but also issued link to [employer] Your Own Arm" video, educated pt about Isotoner gloves that may also be beneficial  PATIENT EDUCATION:  Education details: lymphedema vs localized edema, availability of Isotoner gloves for compression instead of bandaging, link for self bandaging video, educated pt about proper alignment at work station and that her shoulder should be relaxed when using her pen (like her mouse) and monitor at eye level Person educated: Patient Education method: Explanation Education comprehension: verbalized understanding   HOME EXERCISE PROGRAM: Wear compression bandaging on L hand and remove if they become uncomfortable, loose or cause pain  ASSESSMENT:  CLINICAL IMPRESSION: Patient is a 51 y.o. femaile who was seen today for physical therapy evaluation and treatment for L hand swelling and cording in LUE. Pt reports she developed L face, hand and foot swelling after taking exemestane. She has since stopped. She reports her hand swelling is decreasing. She also reports onset of cording around the time of the hand swelling that at times extended from her chest down to her hand.  She reports it was visible and palpable. Today one cord is palpable at her axilla but there are most likely deep cords present throughout LUE. Pt would benefit from skilled PT services to decrease cording and decrease swelling to allow improved comfort. Pt would also benefit from additional education on proper ergonomics at work station.    OBJECTIVE IMPAIRMENTS decreased knowledge of use of DME, increased edema, increased fascial restrictions, postural dysfunction, and pain.   ACTIVITY LIMITATIONS  none  PARTICIPATION LIMITATIONS:  none  PERSONAL FACTORS  none  are also affecting patient's functional outcome.   REHAB POTENTIAL: Excellent  CLINICAL DECISION MAKING: Stable/uncomplicated  EVALUATION COMPLEXITY: Low  GOALS: Goals reviewed with patient? Yes  SHORT TERM GOALS=LONG TERM  GOALS  Target date: 04/23/2022    Pt will demonstrate a 0.3 cm decrease in edema at left 2nd digit to allow improved comfort.  Baseline: Goal status: INITIAL  2.  Pt will either be able to independent bandage her hand or will obtain an appropriate garment to manage hand and finger swelling to allow improved comfort.  Baseline:  Goal status: INITIAL  3.  Pt will be able to verbalize proper work station ergonomics to help improve comfort and decrease muscle strain at work.  Baseline:  Goal status: INITIAL  4.  Pt will report no discomfort from cording throughout LUE to allow improved comfort.  Baseline:  Goal status: INITIAL   PLAN: PT FREQUENCY: 2x/week  PT DURATION: 4 weeks  PLANNED INTERVENTIONS: Therapeutic exercises, Therapeutic activity, Patient/Family education, Self Care, Joint mobilization, Orthotic/Fit training, Manual lymph drainage, Compression bandaging, scar mobilization, Taping, and Manual therapy  PLAN FOR NEXT SESSION: instruct pt in self bandaging, see if pt obtained an isotoner glove, give handout on work Scientist, water quality, MLD to L hand, MFR to cording in L UE   [employer], PT 03/26/2022, 9:34 AM

## 2022-03-27 DIAGNOSIS — Z170 Estrogen receptor positive status [ER+]: Secondary | ICD-10-CM | POA: Diagnosis not present

## 2022-03-27 DIAGNOSIS — F419 Anxiety disorder, unspecified: Secondary | ICD-10-CM | POA: Diagnosis not present

## 2022-03-27 DIAGNOSIS — C50912 Malignant neoplasm of unspecified site of left female breast: Secondary | ICD-10-CM | POA: Diagnosis not present

## 2022-03-27 DIAGNOSIS — Z8673 Personal history of transient ischemic attack (TIA), and cerebral infarction without residual deficits: Secondary | ICD-10-CM | POA: Diagnosis not present

## 2022-03-27 DIAGNOSIS — D6859 Other primary thrombophilia: Secondary | ICD-10-CM | POA: Diagnosis not present

## 2022-03-30 ENCOUNTER — Ambulatory Visit: Payer: BC Managed Care – PPO | Admitting: Physical Therapy

## 2022-03-30 ENCOUNTER — Encounter: Payer: Self-pay | Admitting: Physical Therapy

## 2022-03-30 DIAGNOSIS — Z483 Aftercare following surgery for neoplasm: Secondary | ICD-10-CM | POA: Diagnosis not present

## 2022-03-30 DIAGNOSIS — C50412 Malignant neoplasm of upper-outer quadrant of left female breast: Secondary | ICD-10-CM | POA: Diagnosis not present

## 2022-03-30 DIAGNOSIS — R600 Localized edema: Secondary | ICD-10-CM

## 2022-03-30 DIAGNOSIS — R293 Abnormal posture: Secondary | ICD-10-CM | POA: Diagnosis not present

## 2022-03-30 DIAGNOSIS — Z170 Estrogen receptor positive status [ER+]: Secondary | ICD-10-CM | POA: Diagnosis not present

## 2022-03-30 NOTE — Therapy (Signed)
OUTPATIENT PHYSICAL THERAPY ONCOLOGY TREATMENT  Patient Name: Nayara Taplin MRN: [id number] DOB:[birth year], 51 y.o., female Today's Date: 03/30/2022   PT End of Session - 03/30/22 0822     Visit Number 2    Number of Visits 9    Date for PT Re-Evaluation 04/23/22    PT Start Time 0816   pt arrived late   PT Stop Time 0856    PT Time Calculation (min) 40 min    Activity Tolerance Patient tolerated treatment well    Behavior During Therapy [facility] for tasks assessed/performed             Past Medical History:  Diagnosis Date   Anxiety    Cancer ([place])    breast   Depression    Family history of breast cancer    Family history of lung cancer    Family history of ovarian cancer    GERD (gastroesophageal reflux disease)    diet controlled   History of radiation therapy    Left breast, left axilla- 05/27/21-07/10/21- Dr. [name]   Seasonal allergies    Past Surgical History:  Procedure Laterality Date   BREAST BIOPSY Left 2019   x 2 biopsy   BREAST EXCISIONAL BIOPSY Left    BREAST LUMPECTOMY WITH RADIOACTIVE SEED AND SENTINEL LYMPH NODE BIOPSY Left 02/26/2021   Procedure: LEFT BREAST LUMPECTOMY WITH RADIOACTIVE SEED x2; LEFT AXILLARY SENTINEL NODE BIOPSY;  Surgeon: [name], MD;  Location: [place];  Service: General;  Laterality: Left;   BREAST LUMPECTOMY WITH RADIOFREQUENCY TAG IDENTIFICATION Left 8/63/8177   Procedure: LEFT BREAST MRI WIRE GUIDED EXCISION;  Surgeon: [name], MD;  Location: [place];  Service: General;  Laterality: Left;   BREAST REDUCTION SURGERY Bilateral 04/18/2021   Procedure: MAMMARY REDUCTION  (BREAST) LEFT ONCOPLASTIC BREAST RECONSTRUCTION, RIGHT BREAST REDUCTION;  Surgeon: Irene Limbo, MD;  Location: [place];  Service: Plastics;  Laterality: Bilateral;   CHOLECYSTECTOMY  2008   PORT-A-CATH REMOVAL Right 04/18/2021   Procedure: REMOVAL PORT-A-CATH;  Surgeon: [name], MD;  Location: [place];  Service: General;  Laterality: Right;   PORTACATH PLACEMENT N/A 09/05/2020   Procedure: INSERTION PORT-A-CATH;  Surgeon: [name], MD;  Location: [facility] OR;  Service: General;  Laterality: N/A;  [place] EXTRACTION     Patient Active Problem List   Diagnosis Date Noted   Occipital neuralgia of right side 07/01/2021   Peripheral vertigo 07/01/2021   Genetic testing 09/04/2020   Family history of breast cancer    Family history of ovarian cancer    Family history of lung cancer    Malignant neoplasm of upper-outer quadrant of left breast in female, estrogen receptor positive ([place]) 08/21/2020   Anxiety 11/16/2013   Hypercoagulable state ([place]) 11/16/2013   TIA (transient ischemic attack) 11/15/2013   Chest discomfort 01/20/2012   Factor V Leiden ([place]) 01/20/2012   MVP (mitral valve prolapse) 01/20/2012   Acute bronchitis 12/23/2011    PCP: [name], MD  REFERRING PROVIDER: [name], MD   REFERRING DIAG: [phone] (ICD-10-CM) - Malignant neoplasm of upper-outer quadrant of left breast in female, estrogen receptor positive ([place])  THERAPY DIAG:  Localized edema  Aftercare following surgery for neoplasm  Abnormal posture  Malignant neoplasm of upper-outer quadrant of left breast in female, estrogen receptor positive ([place])  ONSET DATE: 03/03/22  Rationale for Evaluation and Treatment Rehabilitation  SUBJECTIVE  SUBJECTIVE STATEMENT: The hand was less swollen when I took off the bandages. I threw them away and did not try to rebandage. I bought gloves before I saw you but I could not find them this weekend.   PERTINENT HISTORY:   Lt lumpectomy with SLNB 0/4 LN on 02/26/21, chemotherapy and radiation completed ;  ER/PR+, HER2- PAIN:  Are you having pain? Yes NPRS scale: 3/10 Pain location: hand Pain orientation: Left  PAIN TYPE: discomfort Pain description: constant  Aggravating factors: trying to carry something, something tight on arm Relieving factors: nothing  PRECAUTIONS: Other: LUE lymphedema risk  WEIGHT BEARING RESTRICTIONS No  FALLS:  Has patient fallen in last 6 months? No  LIVING ENVIRONMENT: Lives with: lives with their family Lives in: House/apartment Has following equipment at home: None  OCCUPATION: full time, designer  LEISURE: pt is walking 30 min/5 days per week, swimming  HAND DOMINANCE : right   PRIOR LEVEL OF FUNCTION: Independent  PATIENT GOALS to get rid of swelling in the hand   OBJECTIVE  COGNITION:  Overall cognitive status: Within functional limits for tasks assessed   PALPATION: Some cording palpable in L axilla, pt reports deep cording extending down in to forearm, therapist unable to palpate today but pt reports it comes and goes  OBSERVATIONS / OTHER ASSESSMENTS: visible swelling in left hand especially at 2nd digit  POSTURE: forward head and rounded shoulders  UPPER EXTREMITY AROM/PROM:  A/PROM RIGHT   eval   Shoulder extension 68  Shoulder flexion 160  Shoulder abduction 160  Shoulder internal rotation   Shoulder external rotation     (Blank rows = not tested)  A/PROM LEFT   eval  Shoulder extension 70  Shoulder flexion 166  Shoulder abduction 173  Shoulder internal rotation   Shoulder external rotation     (Blank rows = not tested)   LYMPHEDEMA ASSESSMENTS:   SURGERY TYPE/DATE: Left lumpectomy and SLNB 02/26/21  NUMBER OF LYMPH NODES REMOVED: 0/4  CHEMOTHERAPY: completed  RADIATION: completed  HORMONE TREATMENT: had to stop due to side effects  INFECTIONS: none reported   LYMPHEDEMA ASSESSMENTS:   LANDMARK RIGHT  eval  10 cm proximal to olecranon process 33.2  Olecranon process 26.8  10 cm proximal to  ulnar styloid process 24  Just proximal to ulnar styloid process 16.5  Across hand at thumb web space 19.9  At base of 2nd digit 6.4  (Blank rows = not tested)  LANDMARK LEFT  eval LEFT 03/30/22  10 cm proximal to olecranon process 34.5   Olecranon process 26.7   10 cm proximal to ulnar styloid process 24.1   Just proximal to ulnar styloid process 16.3 16.6  Across hand at thumb web space 20 19.4  At base of 2nd digit 7 6.7  (Blank rows = not tested)     QUICK DASH SURVEY:     TODAY'S TREATMENT  03/30/22: Remeasured circumferences. Did not bandage at end of session today because pt did not bring bandages back. She threw them away but think she may be able to recover them. MFR to deep cording throughout LUE extending from axilla down to hand. Cording visible during MFR at antecubital fossa. MLD to LUE following MFR as follows: short neck, 5 diaphragmatic breaths, R axillary nodes and establishment of interaxillary pathway, L inguinal nodes and establishment of axillo inguinal pathway, L UE working proximal to distal with increased time spent at hand where pt has swelling then retracing all steps.   03/26/22: Compression  bandaging to hand as follows (done by Shan Levans, PT) stockinette, then artiflex to cover the hand and wrist, then Elastomull to fingers 1-5, and then 1 6cm hand bandage.  Brief instruction given to patient on self bandaging but also issued link to [employer] Your Own Arm" video, educated pt about Isotoner gloves that may also be beneficial  PATIENT EDUCATION:  Education details: lymphedema vs localized edema, availability of Isotoner gloves for compression instead of bandaging, link for self bandaging video, educated pt about proper alignment at work station and that her shoulder should be relaxed when using her pen (like her mouse) and monitor at eye level Person educated: Patient Education method: Explanation Education comprehension: verbalized  understanding   HOME EXERCISE PROGRAM: Wear compression bandaging on L hand and remove if they become uncomfortable, loose or cause pain  ASSESSMENT:  CLINICAL IMPRESSION: Remeasured pt's circumferences. She has increased some at wrist but demonstrates reduction throughout her hand. Began MFR to deep cording throughout L UE. Cording is most palpable/visible at medial antecubital fossa. Pt reports that this is the discomfort she has been feeling in her arm. Ended session with MLD to help decrease edema especially at hand. Educated pt to bring her bandages back next session so we can reapply them.    OBJECTIVE IMPAIRMENTS decreased knowledge of use of DME, increased edema, increased fascial restrictions, postural dysfunction, and pain.   ACTIVITY LIMITATIONS  none  PARTICIPATION LIMITATIONS:  none  PERSONAL FACTORS  none  are also affecting patient's functional outcome.   REHAB POTENTIAL: Excellent  CLINICAL DECISION MAKING: Stable/uncomplicated  EVALUATION COMPLEXITY: Low  GOALS: Goals reviewed with patient? Yes  SHORT TERM GOALS=LONG TERM GOALS  Target date: 04/27/2022    Pt will demonstrate a 0.3 cm decrease in edema at left 2nd digit to allow improved comfort.  Baseline: Goal status: INITIAL  2.  Pt will either be able to independent bandage her hand or will obtain an appropriate garment to manage hand and finger swelling to allow improved comfort.  Baseline:  Goal status: INITIAL  3.  Pt will be able to verbalize proper work station ergonomics to help improve comfort and decrease muscle strain at work.  Baseline:  Goal status: INITIAL  4.  Pt will report no discomfort from cording throughout LUE to allow improved comfort.  Baseline:  Goal status: INITIAL   PLAN: PT FREQUENCY: 2x/week  PT DURATION: 4 weeks  PLANNED INTERVENTIONS: Therapeutic exercises, Therapeutic activity, Patient/Family education, Self Care, Joint mobilization, Orthotic/Fit training, Manual  lymph drainage, Compression bandaging, scar mobilization, Taping, and Manual therapy  PLAN FOR NEXT SESSION: instruct pt in self bandaging, see if pt obtained an isotoner glove, give handout on work Scientist, water quality, MLD to L hand, MFR to cording in L UE   [employer], PT 03/30/2022, 9:11 AM

## 2022-03-31 ENCOUNTER — Ambulatory Visit (HOSPITAL_COMMUNITY)
Admission: RE | Admit: 2022-03-31 | Discharge: 2022-03-31 | Disposition: A | Discharge status: Home / Self Care | Payer: BC Managed Care – PPO | Source: Ambulatory Visit | Attending: Adult Health | Admitting: Adult Health

## 2022-03-31 ENCOUNTER — Encounter: Payer: Self-pay | Admitting: Adult Health

## 2022-03-31 ENCOUNTER — Inpatient Hospital Stay (HOSPITAL_BASED_OUTPATIENT_CLINIC_OR_DEPARTMENT_OTHER): Payer: BC Managed Care – PPO | Admitting: Adult Health

## 2022-03-31 ENCOUNTER — Other Ambulatory Visit: Payer: Self-pay

## 2022-03-31 VITALS — BP 119/64 | HR 67 | Temp 97.7°F | Resp 18 | Ht 66.0 in | Wt 215.1 lb

## 2022-03-31 DIAGNOSIS — R635 Abnormal weight gain: Secondary | ICD-10-CM | POA: Diagnosis not present

## 2022-03-31 DIAGNOSIS — M7061 Trochanteric bursitis, right hip: Secondary | ICD-10-CM | POA: Diagnosis not present

## 2022-03-31 DIAGNOSIS — C50412 Malignant neoplasm of upper-outer quadrant of left female breast: Secondary | ICD-10-CM

## 2022-03-31 DIAGNOSIS — Z170 Estrogen receptor positive status [ER+]: Secondary | ICD-10-CM

## 2022-03-31 DIAGNOSIS — M7062 Trochanteric bursitis, left hip: Secondary | ICD-10-CM | POA: Diagnosis not present

## 2022-03-31 DIAGNOSIS — Z923 Personal history of irradiation: Secondary | ICD-10-CM | POA: Diagnosis not present

## 2022-03-31 DIAGNOSIS — D6851 Activated protein C resistance: Secondary | ICD-10-CM | POA: Diagnosis not present

## 2022-03-31 DIAGNOSIS — Z8679 Personal history of other diseases of the circulatory system: Secondary | ICD-10-CM | POA: Diagnosis not present

## 2022-03-31 DIAGNOSIS — M7989 Other specified soft tissue disorders: Secondary | ICD-10-CM | POA: Diagnosis not present

## 2022-03-31 DIAGNOSIS — Z9221 Personal history of antineoplastic chemotherapy: Secondary | ICD-10-CM | POA: Diagnosis not present

## 2022-03-31 DIAGNOSIS — Z79811 Long term (current) use of aromatase inhibitors: Secondary | ICD-10-CM | POA: Diagnosis not present

## 2022-03-31 DIAGNOSIS — R5383 Other fatigue: Secondary | ICD-10-CM | POA: Diagnosis not present

## 2022-03-31 DIAGNOSIS — M25552 Pain in left hip: Secondary | ICD-10-CM | POA: Diagnosis not present

## 2022-03-31 DIAGNOSIS — Z8041 Family history of malignant neoplasm of ovary: Secondary | ICD-10-CM | POA: Diagnosis not present

## 2022-03-31 DIAGNOSIS — M2550 Pain in unspecified joint: Secondary | ICD-10-CM | POA: Diagnosis not present

## 2022-03-31 DIAGNOSIS — M25551 Pain in right hip: Secondary | ICD-10-CM | POA: Diagnosis not present

## 2022-03-31 DIAGNOSIS — Z803 Family history of malignant neoplasm of breast: Secondary | ICD-10-CM | POA: Diagnosis not present

## 2022-03-31 DIAGNOSIS — Z853 Personal history of malignant neoplasm of breast: Secondary | ICD-10-CM | POA: Diagnosis not present

## 2022-03-31 DIAGNOSIS — Z801 Family history of malignant neoplasm of trachea, bronchus and lung: Secondary | ICD-10-CM | POA: Diagnosis not present

## 2022-03-31 NOTE — Progress Notes (Signed)
[facility] Follow up:    [name], NP [place] [address]   DIAGNOSIS:  Cancer Staging  Malignant neoplasm of upper-outer quadrant of left breast in female, estrogen receptor positive ([place]) Staging form: Breast, AJCC 8th Edition - Clinical stage from 08/21/2020: Stage IIA (cT1c, cN1, cM0, G3, ER+, PR+, HER2-) - Signed by [name], MD on 08/21/2020 - Pathologic stage from 02/26/2021: No Stage Recommended (ypT0, pN0, cM0) - Signed by [name], NP on 12/04/2021 Stage prefix: Post-therapy   SUMMARY OF ONCOLOGIC HISTORY: Oncology History  Malignant neoplasm of upper-outer quadrant of left breast in female, estrogen receptor positive ([place])  08/21/2020 Initial Diagnosis   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 No pathogenic variants identified on the [facility] Breast Cancer STAT Panel + Multi-Cancer Panel. VUS in [facility] called c.983C>T identified. The report date is 09/04/2020.   The STAT Breast cancer panel offered by [facility] includes sequencing and rearrangement analysis for the following 9 genes:  ATM, BRCA1, BRCA2, CDH1, CHEK2, PALB2, PTEN, STK11 and TP53.    The Multi-Cancer Panel offered by [facility] includes sequencing and/or deletion duplication testing of the following 85 genes: AIP, ALK, APC, ATM, AXIN2,BAP1,  BARD1, BLM, BMPR1A, BRCA1,  BRCA2, BRIP1, CASR, CDC73, CDH1, CDK4, CDKN1B, CDKN1C, CDKN2A (p14ARF), CDKN2A (p16INK4a), CEBPA, CHEK2, CTNNA1, DICER1, DIS3L2, EGFR (c.2369C>T, p.Thr790Met variant only), EPCAM (Deletion/duplication testing only), FH, FLCN, GATA2, GPC3, GREM1 (Promoter region deletion/duplication testing only), HOXB13 (c.251G>A, p.Gly84Glu), HRAS, KIT, MAX, MEN1, MET, MITF (c.952G>A, p.Glu318Lys variant only), MLH1, MSH2, MSH3, MSH6, MUTYH, NBN, NF1, NF2, NTHL1, PALB2, PDGFRA, PHOX2B, PMS2, POLD1, POLE, POT1, PRKAR1A, PTCH1, PTEN, RAD50, RAD51C, RAD51D, RB1, RECQL4, RET, RNF43, RUNX1, SDHAF2, SDHA (sequence changes only), SDHB, SDHC, SDHD, SMAD4, SMARCA4, SMARCB1, SMARCE1, STK11, SUFU, TERC, TERT, TMEM127, TP53, TSC1, TSC2, VHL, WRN and WT1.    09/06/2020 - 01/17/2021 Chemotherapy   AC X 4 foll by Taxol       02/26/2021 Surgery   Left lumpectomy: no residual carcinoma with margins and axillary lymph nodes also negative for carcinoma     02/26/2021 Cancer Staging   Staging form: Breast, AJCC 8th Edition - Pathologic stage from 02/26/2021: No Stage Recommended (ypT0, pN0, cM0) - Signed by [name], NP on 12/04/2021 Stage prefix: Post-therapy   04/18/2021 Surgery   Removal of retained clip, port removal and bilateral breast mammoplasty    05/27/2021 - 07/10/2021 Radiation Therapy    Site Technique Total Dose (Gy) Dose per Fx (Gy) Completed Fx Beam Energies  Breast, Left: Breast_Lt 3D 50.4/50.4 1.8 28/28 6X, 10X  Axilla, Left: Axilla_Lt 3D 50.4/50.4 1.8 28/28 6X, 15X    08/2021 -  Anti-estrogen oral therapy   Unable to tolerate tamoxifen or letrozole, changed to exemestane daily 09/18/21     CURRENT THERAPY: observation  INTERVAL HISTORY: Endia Moncur 51 y.o. female returns for f/u after being off of exemestane.  She went to  PT and is working on left arm lymphedema.  She has some hip pain she has noticed bilaterally.  She also notes some right forearm swelling that is new and worrisome to her.      Patient Active Problem List   Diagnosis Date Noted   Occipital neuralgia of right side 07/01/2021   Peripheral vertigo 07/01/2021   Genetic testing 09/04/2020   Family history of breast cancer    Family history of ovarian cancer    Family history of lung cancer    Malignant neoplasm of upper-outer quadrant of left breast in female, estrogen receptor positive ([place]) 08/21/2020   Anxiety 11/16/2013   Hypercoagulable state ([place]) 11/16/2013   TIA (transient ischemic attack) 11/15/2013   Chest discomfort 01/20/2012   Factor V Leiden ([place]) 01/20/2012   MVP (mitral valve prolapse) 01/20/2012   Acute bronchitis 12/23/2011    is allergic to sulfa antibiotics and sulfamethoxazole.  MEDICAL HISTORY: Past Medical History:  Diagnosis Date   Anxiety    Cancer ([place])    breast   Depression    Family history of breast cancer    Family history of lung cancer    Family history of ovarian cancer    GERD (gastroesophageal reflux disease)    diet controlled   History of radiation therapy    Left breast, left axilla- 05/27/21-07/10/21- Dr. [name]   Seasonal allergies     SURGICAL HISTORY: Past Surgical History:  Procedure Laterality Date   BREAST BIOPSY Left 2019   x 2 biopsy   BREAST EXCISIONAL BIOPSY Left    BREAST LUMPECTOMY WITH RADIOACTIVE SEED AND SENTINEL LYMPH NODE BIOPSY Left 02/26/2021   Procedure: LEFT BREAST LUMPECTOMY WITH RADIOACTIVE SEED x2; LEFT AXILLARY SENTINEL NODE BIOPSY;  Surgeon: [name], MD;  Location: [place];  Service: General;  Laterality: Left;   BREAST LUMPECTOMY WITH RADIOFREQUENCY TAG IDENTIFICATION Left 1/95/0932   Procedure: LEFT BREAST MRI WIRE GUIDED EXCISION;  Surgeon: [name], MD;  Location: [place];  Service: General;  Laterality: Left;   BREAST REDUCTION SURGERY Bilateral 04/18/2021   Procedure: MAMMARY REDUCTION  (BREAST) LEFT ONCOPLASTIC BREAST RECONSTRUCTION, RIGHT BREAST REDUCTION;   Surgeon: Irene Limbo, MD;  Location: [place];  Service: Plastics;  Laterality: Bilateral;   CHOLECYSTECTOMY  2008   PORT-A-CATH REMOVAL Right 04/18/2021   Procedure: REMOVAL PORT-A-CATH;  Surgeon: [name], MD;  Location: [place];  Service: General;  Laterality: Right;   PORTACATH PLACEMENT N/A 09/05/2020   Procedure: INSERTION PORT-A-CATH;  Surgeon: [name], MD;  Location: [place];  Service: General;  Laterality: N/A;  PEC BLOCK   WISDOM TOOTH EXTRACTION      SOCIAL HISTORY: Social History   Socioeconomic History   Marital status: Married    Spouse name: Not on file   Number of children: Not on file   Years of education: Not on file   Highest education level: Not on file  Occupational History   Not on file  Tobacco Use   Smoking status: Never   Smokeless tobacco: Never  Vaping Use   Vaping Use: Never used  Substance and Sexual Activity   Alcohol use: Not Currently   Drug use: Never   Sexual activity: Yes    Birth control/protection: None  Other Topics Concern   Not on file  Social History Narrative   Not on file   Social Determinants of Health   Financial Resource Strain: Not on file  Food Insecurity: No  Food Insecurity (09/06/2020)   Hunger Vital Sign    Worried About Running Out of Food in the Last Year: Never true    Ran Out of Food in the Last Year: Never true  Transportation Needs: No Transportation Needs (09/06/2020)   PRAPARE - Hydrologist (Medical): No    Lack of Transportation (Non-Medical): No  Physical Activity: Not on file  Stress: Not on file  Social Connections: Not on file  Intimate Partner Violence: Not on file    FAMILY HISTORY: Family History  Problem Relation Age of Onset   Eczema Father    Allergic rhinitis Brother    Breast cancer Mother        in 39's   Cancer Maternal Uncle        unk type   Lung cancer Maternal Grandfather 42   Heart Problems  Paternal Grandfather    Melanoma Cousin    Ovarian cancer Paternal Great-grandmother    Cancer Paternal Great-grandmother        GYN cancer, possibly ovarian?   Cancer Maternal Aunt        half aunts/uncles x5- rare cancers    Review of Systems  Constitutional:  Positive for fatigue. Negative for appetite change, chills, fever and unexpected weight change.  HENT:   Negative for hearing loss, lump/mass and trouble swallowing.   Eyes:  Negative for eye problems and icterus.  Respiratory:  Negative for chest tightness, cough and shortness of breath.   Cardiovascular:  Negative for chest pain, leg swelling and palpitations.  Gastrointestinal:  Negative for abdominal distention, abdominal pain, constipation, diarrhea, nausea and vomiting.  Endocrine: Negative for hot flashes.  Genitourinary:  Negative for difficulty urinating.   Musculoskeletal:  Negative for arthralgias.  Skin:  Negative for itching and rash.  Neurological:  Negative for dizziness, extremity weakness, headaches and numbness.  Hematological:  Negative for adenopathy. Does not bruise/bleed easily.  Psychiatric/Behavioral:  Negative for depression. The patient is nervous/anxious.       PHYSICAL EXAMINATION  ECOG PERFORMANCE STATUS: 1 - Symptomatic but completely ambulatory  Vitals:   03/31/22 1242  BP: 119/64  Pulse: 67  Resp: 18  Temp: 97.7 F (36.5 C)  SpO2: 98%    Physical Exam Constitutional:      General: She is not in acute distress.    Appearance: Normal appearance. She is not toxic-appearing.  HENT:     Head: Normocephalic and atraumatic.  Eyes:     General: No scleral icterus. Cardiovascular:     Rate and Rhythm: Normal rate and regular rhythm.     Pulses: Normal pulses.     Heart sounds: Normal heart sounds.  Pulmonary:     Effort: Pulmonary effort is normal.     Breath sounds: Normal breath sounds.  Abdominal:     General: Abdomen is flat. Bowel sounds are normal. There is no distension.      Palpations: Abdomen is soft.     Tenderness: There is no abdominal tenderness.  Musculoskeletal:        General: No swelling.     Cervical back: Neck supple.     Comments: right forearm with slight swelling? Lipoma under skin  +TTP over greater trochanter bilaterally   Lymphadenopathy:     Cervical: No cervical adenopathy.  Skin:    General: Skin is warm and dry.     Findings: No rash.  Neurological:     General: No focal deficit present.  Mental Status: She is alert.  Psychiatric:        Mood and Affect: Mood normal.        Behavior: Behavior normal.     LABORATORY DATA:  CBC    Component Value Date/Time   WBC 4.9 03/19/2022 1324   WBC 3.2 (L) 01/17/2021 1015   RBC 4.60 03/19/2022 1324   HGB 14.6 03/19/2022 1324   HCT 40.6 03/19/2022 1324   PLT 209 03/19/2022 1324   MCV 88.3 03/19/2022 1324   MCH 31.7 03/19/2022 1324   MCHC 36.0 03/19/2022 1324   RDW 13.7 03/19/2022 1324   LYMPHSABS 1.4 03/19/2022 1324   MONOABS 0.4 03/19/2022 1324   EOSABS 0.1 03/19/2022 1324   BASOSABS 0.1 03/19/2022 1324    CMP     Component Value Date/Time   NA 140 03/19/2022 1324   K 4.1 03/19/2022 1324   CL 104 03/19/2022 1324   CO2 27 03/19/2022 1324   GLUCOSE 89 03/19/2022 1324   BUN 12 03/19/2022 1324   CREATININE 0.88 03/19/2022 1324   CALCIUM 9.4 03/19/2022 1324   PROT 7.5 03/19/2022 1324   ALBUMIN 4.3 03/19/2022 1324   AST 37 03/19/2022 1324   ALT 45 (H) 03/19/2022 1324   ALKPHOS 87 03/19/2022 1324   BILITOT 0.5 03/19/2022 1324   GFRNONAA >60 03/19/2022 1324         ASSESSMENT and THERAPY PLAN:   Malignant neoplasm of upper-outer quadrant of left breast in female, estrogen receptor positive (HCC) [place] continues to struggle off Exemestane, however she is slowly improving.  I recommended that she continue off Exemestane for now and optimize risk reduction with healthy diet and exercise.    She was offered Monjouro by her PCP, and is considering taking this.  I  let her know that taking it would be fine from an oncologic perspective.    We will obtain xrays of her hips and forearm.  I think she may have greater trochanteric bursitis and that can be relieved with injections.  If xrays do not show anything unexpected we can refer her to sports medicine.    She will f/u with Dr. Lindi Adie in 3 weeks.  She knows to call for any questions or concerns in the interim.     All questions were answered. The patient knows to call the clinic with any problems, questions or concerns. We can certainly see the patient much sooner if necessary.  Total encounter time:30 minutes*in face-to-face visit time, chart review, lab review, care coordination, order entry, and documentation of the encounter time.    Wilber Bihari, NP 04/04/22 1:55 PM Medical Oncology and Hematology [facility] [place], [place] [address] Tel. [phone]    Fax. [phone]  *Total Encounter Time as defined by the Centers for Medicare and Medicaid Services includes, in addition to the face-to-face time of a patient visit (documented in the note above) non-face-to-face time: obtaining and reviewing outside history, ordering and reviewing medications, tests or procedures, care coordination (communications with other health care professionals or caregivers) and documentation in the medical record.

## 2022-04-01 ENCOUNTER — Telehealth: Payer: Self-pay | Admitting: *Deleted

## 2022-04-01 ENCOUNTER — Telehealth: Payer: Self-pay | Admitting: Hematology and Oncology

## 2022-04-01 NOTE — Telephone Encounter (Signed)
Per Lindsey Causey,NP, called pt with message below. Pt verbalized understanding.  

## 2022-04-01 NOTE — Telephone Encounter (Signed)
Scheduled appointment per 8/29 los. Left voicemail.

## 2022-04-01 NOTE — Telephone Encounter (Signed)
-----   Message from [name], NP sent at 04/01/2022 11:51 AM EDT ----- Please let patient know her xrays are normal, no soft tissue abnormality is noted.  I recommend close monitoring of the area she's concerned about on her arm, and to ask Dr. Lindi Adie to examine it too when he sees her in 2-3 weeks ----- Message ----- From: Interface, Rad Results In Sent: 03/31/2022   2:26 PM EDT To: [name], NP

## 2022-04-03 ENCOUNTER — Other Ambulatory Visit: Payer: Self-pay | Admitting: Adult Health

## 2022-04-03 ENCOUNTER — Encounter: Payer: BC Managed Care – PPO | Admitting: Rehabilitation

## 2022-04-03 DIAGNOSIS — I341 Nonrheumatic mitral (valve) prolapse: Secondary | ICD-10-CM

## 2022-04-04 NOTE — Assessment & Plan Note (Signed)
Tracy Dyer continues to struggle off Exemestane, however she is slowly improving.  I recommended that she continue off Exemestane for now and optimize risk reduction with healthy diet and exercise.    She was offered Monjouro by her PCP, and is considering taking this.  I let her know that taking it would be fine from an oncologic perspective.    We will obtain xrays of her hips and forearm.  I think she may have greater trochanteric bursitis and that can be relieved with injections.  If xrays do not show anything unexpected we can refer her to sports medicine.    She will f/u with Tracy Dyer in 3 weeks.  She knows to call for any questions or concerns in the interim.

## 2022-04-07 ENCOUNTER — Ambulatory Visit: Payer: BC Managed Care – PPO | Attending: Hematology and Oncology | Admitting: Rehabilitation

## 2022-04-07 ENCOUNTER — Encounter: Payer: Self-pay | Admitting: Rehabilitation

## 2022-04-07 DIAGNOSIS — Z483 Aftercare following surgery for neoplasm: Secondary | ICD-10-CM | POA: Insufficient documentation

## 2022-04-07 DIAGNOSIS — R600 Localized edema: Secondary | ICD-10-CM | POA: Diagnosis not present

## 2022-04-07 DIAGNOSIS — Z170 Estrogen receptor positive status [ER+]: Secondary | ICD-10-CM | POA: Diagnosis not present

## 2022-04-07 DIAGNOSIS — R293 Abnormal posture: Secondary | ICD-10-CM | POA: Insufficient documentation

## 2022-04-07 DIAGNOSIS — C50412 Malignant neoplasm of upper-outer quadrant of left female breast: Secondary | ICD-10-CM | POA: Insufficient documentation

## 2022-04-07 NOTE — Therapy (Signed)
OUTPATIENT PHYSICAL THERAPY ONCOLOGY TREATMENT  Patient Name: Tracy Dyer MRN: [id number] DOB:05-Sep-1970, 51 y.o., female Today's Date: 04/07/2022   PT End of Session - 04/07/22 1609     Visit Number 3    Number of Visits 9    Date for PT Re-Evaluation 04/23/22    PT Start Time 7673    PT Stop Time 1700    PT Time Calculation (min) 53 min    Activity Tolerance Patient tolerated treatment well    Behavior During Therapy WFL for tasks assessed/performed             Past Medical History:  Diagnosis Date   Anxiety    Cancer ([place])    breast   Depression    Family history of breast cancer    Family history of lung cancer    Family history of ovarian cancer    GERD (gastroesophageal reflux disease)    diet controlled   History of radiation therapy    Left breast, left axilla- 05/27/21-07/10/21- Dr. [name]   Seasonal allergies    Past Surgical History:  Procedure Laterality Date   BREAST BIOPSY Left 2019   x 2 biopsy   BREAST EXCISIONAL BIOPSY Left    BREAST LUMPECTOMY WITH RADIOACTIVE SEED AND SENTINEL LYMPH NODE BIOPSY Left 02/26/2021   Procedure: LEFT BREAST LUMPECTOMY WITH RADIOACTIVE SEED x2; LEFT AXILLARY SENTINEL NODE BIOPSY;  Surgeon: [name], MD;  Location: [place];  Service: General;  Laterality: Left;   BREAST LUMPECTOMY WITH RADIOFREQUENCY TAG IDENTIFICATION Left 11/19/3788   Procedure: LEFT BREAST MRI WIRE GUIDED EXCISION;  Surgeon: [name], MD;  Location: [place];  Service: General;  Laterality: Left;   BREAST REDUCTION SURGERY Bilateral 04/18/2021   Procedure: MAMMARY REDUCTION  (BREAST) LEFT ONCOPLASTIC BREAST RECONSTRUCTION, RIGHT BREAST REDUCTION;  Surgeon: Irene Limbo, MD;  Location: [place];  Service: Plastics;  Laterality: Bilateral;   CHOLECYSTECTOMY  2008   PORT-A-CATH REMOVAL Right 04/18/2021   Procedure: REMOVAL PORT-A-CATH;  Surgeon: [name], MD;   Location: [place];  Service: General;  Laterality: Right;   PORTACATH PLACEMENT N/A 09/05/2020   Procedure: INSERTION PORT-A-CATH;  Surgeon: [name], MD;  Location[facility];  Service: General;  Laterality: N/A;  [place] EXTRACTION     Patient Active Problem List   Diagnosis Date Noted   Occipital neuralgia of right side 07/01/2021   Peripheral vertigo 07/01/2021   Genetic testing 09/04/2020   Family history of breast cancer    Family history of ovarian cancer    Family history of lung cancer    Malignant neoplasm of upper-outer quadrant of left breast in female, estrogen receptor positive ([place]) 08/21/2020   Anxiety 11/16/2013   Hypercoagulable state ([place]) 11/16/2013   TIA (transient ischemic attack) 11/15/2013   Chest discomfort 01/20/2012   Factor V Leiden ([place]) 01/20/2012   MVP (mitral valve prolapse) 01/20/2012   Acute bronchitis 12/23/2011    PCP: [name], MD  REFERRING PROVIDER: [name], MD   REFERRING DIAG: [phone] (ICD-10-CM) - Malignant neoplasm of upper-outer quadrant of left breast in female, estrogen receptor positive ([place])  THERAPY DIAG:  Localized edema  Aftercare following surgery for neoplasm  Abnormal posture  Malignant neoplasm of upper-outer quadrant of left breast in female, estrogen receptor positive ([place])  ONSET DATE: 03/03/22  Rationale for Evaluation and Treatment Rehabilitation  SUBJECTIVE  SUBJECTIVE STATEMENT: I feel like both hands are now swollen.  I can't tell if the glove works or not.    PERTINENT HISTORY:   Lt lumpectomy with SLNB 0/4 LN on 02/26/21, chemotherapy and radiation completed ; ER/PR+, HER2- PAIN:  Are you having pain? Yes NPRS scale: 3/10 Pain location: hand Pain orientation: Left   PAIN TYPE: discomfort Pain description: constant  Aggravating factors: trying to carry something, something tight on arm Relieving factors: nothing  PRECAUTIONS: Other: LUE lymphedema risk  WEIGHT BEARING RESTRICTIONS No  FALLS:  Has patient fallen in last 6 months? No  LIVING ENVIRONMENT: Lives with: lives with their family Lives in: House/apartment Has following equipment at home: None  OCCUPATION: full time, designer  LEISURE: pt is walking 30 min/5 days per week, swimming  HAND DOMINANCE : right   PRIOR LEVEL OF FUNCTION: Independent  PATIENT GOALS to get rid of swelling in the hand   OBJECTIVE  COGNITION:  Overall cognitive status: Within functional limits for tasks assessed   PALPATION: Some cording palpable in L axilla, pt reports deep cording extending down in to forearm, therapist unable to palpate today but pt reports it comes and goes  OBSERVATIONS / OTHER ASSESSMENTS: visible swelling in left hand especially at 2nd digit  POSTURE: forward head and rounded shoulders  UPPER EXTREMITY AROM/PROM:  A/PROM RIGHT   eval   Shoulder extension 68  Shoulder flexion 160  Shoulder abduction 160  Shoulder internal rotation   Shoulder external rotation     (Blank rows = not tested)  A/PROM LEFT   eval  Shoulder extension 70  Shoulder flexion 166  Shoulder abduction 173  Shoulder internal rotation   Shoulder external rotation     (Blank rows = not tested)   LYMPHEDEMA ASSESSMENTS:   SURGERY TYPE/DATE: Left lumpectomy and SLNB 02/26/21 NUMBER OF LYMPH NODES REMOVED: 0/4 CHEMOTHERAPY: completed RADIATION: completed HORMONE TREATMENT: had to stop due to side effects INFECTIONS: none reported   LYMPHEDEMA ASSESSMENTS:   LANDMARK RIGHT  eval  10 cm proximal to olecranon process 33.2  Olecranon process 26.8  10 cm proximal to ulnar styloid process 24  Just proximal to ulnar styloid process 16.5  Across hand at thumb web space 19.9  At base of 2nd  digit 6.4  (Blank rows = not tested)  LANDMARK LEFT  eval LEFT 03/30/22  10 cm proximal to olecranon process 34.5   Olecranon process 26.7   10 cm proximal to ulnar styloid process 24.1   Just proximal to ulnar styloid process 16.3 16.6  Across hand at thumb web space 20 19.4  At base of 2nd digit 7 6.7  (Blank rows = not tested)  TODAY'S TREATMENT  04/07/22:  Re-did SOZO just to double check which was still WNL.   MFR to deep cording throughout LUE extending from axilla down to hand. Cording visible during MFR at axilla with skin stretch. MLD to LUE following MFR as follows: short neck, 5 diaphragmatic breaths, R axillary nodes and establishment of interaxillary pathway, L inguinal nodes and establishment of axillo inguinal pathway, L UE working proximal to distal with increased time spent at hand where pt has swelling then retracing all steps.  Applied tg soft to mid forearm, fingers 1-5, artiflex hand  and wrist, then 1 6cm hand bandage.    03/30/22: Remeasured circumferences. Did not bandage at end of session today because pt did not bring bandages back. She threw them away but think she may be able to  recover them. MFR to deep cording throughout LUE extending from axilla down to hand. Cording visible during MFR at antecubital fossa. MLD to LUE following MFR as follows: short neck, 5 diaphragmatic breaths, R axillary nodes and establishment of interaxillary pathway, L inguinal nodes and establishment of axillo inguinal pathway, L UE working proximal to distal with increased time spent at hand where pt has swelling then retracing all steps.   03/26/22: Compression bandaging to hand as follows (done by Shan Levans, PT) stockinette, then artiflex to cover the hand and wrist, then Elastomull to fingers 1-5, and then 1 6cm hand bandage.  Brief instruction given to patient on self bandaging but also issued link to [employer] Your Own Arm" video, educated pt about Isotoner gloves that may  also be beneficial  PATIENT EDUCATION:  Education details: lymphedema vs localized edema, availability of Isotoner gloves for compression instead of bandaging, link for self bandaging video, educated pt about proper alignment at work station and that her shoulder should be relaxed when using her pen (like her mouse) and monitor at eye level Person educated: Patient Education method: Explanation Education comprehension: verbalized understanding   HOME EXERCISE PROGRAM: Wear compression bandaging on L hand and remove if they become uncomfortable, loose or cause pain  ASSESSMENT:  CLINICAL IMPRESSION: Overall odd swelling presentation which is bil Lt>Rt and worse in the morning with stiffness.  Continued MFR to deep cording throughout L UE and MLD to help decrease edema especially at hand.   OBJECTIVE IMPAIRMENTS decreased knowledge of use of DME, increased edema, increased fascial restrictions, postural dysfunction, and pain.   ACTIVITY LIMITATIONS  none  PARTICIPATION LIMITATIONS:  none  PERSONAL FACTORS  none  are also affecting patient's functional outcome.   REHAB POTENTIAL: Excellent  CLINICAL DECISION MAKING: Stable/uncomplicated  EVALUATION COMPLEXITY: Low  GOALS: Goals reviewed with patient? Yes  SHORT TERM GOALS=LONG TERM GOALS  Target date: 05/05/2022    Pt will demonstrate a 0.3 cm decrease in edema at left 2nd digit to allow improved comfort.  Baseline: Goal status: INITIAL  2.  Pt will either be able to independent bandage her hand or will obtain an appropriate garment to manage hand and finger swelling to allow improved comfort.  Baseline:  Goal status: INITIAL  3.  Pt will be able to verbalize proper work station ergonomics to help improve comfort and decrease muscle strain at work.  Baseline:  Goal status: INITIAL  4.  Pt will report no discomfort from cording throughout LUE to allow improved comfort.  Baseline:  Goal status: INITIAL   PLAN: PT  FREQUENCY: 2x/week  PT DURATION: 4 weeks  PLANNED INTERVENTIONS: Therapeutic exercises, Therapeutic activity, Patient/Family education, Self Care, Joint mobilization, Orthotic/Fit training, Manual lymph drainage, Compression bandaging, scar mobilization, Taping, and Manual therapy  PLAN FOR NEXT SESSION: , give handout on work Scientist, water quality, MLD to L hand, MFR to cording in L UE   Ivadell Gaul, Adrian Prince, PT 04/07/2022, 5:04 PM

## 2022-04-09 ENCOUNTER — Ambulatory Visit: Payer: BC Managed Care – PPO

## 2022-04-12 ENCOUNTER — Other Ambulatory Visit: Payer: Self-pay | Admitting: Hematology and Oncology

## 2022-04-13 ENCOUNTER — Encounter: Payer: Self-pay | Admitting: Rehabilitation

## 2022-04-13 ENCOUNTER — Ambulatory Visit: Payer: BC Managed Care – PPO | Admitting: Rehabilitation

## 2022-04-13 DIAGNOSIS — R293 Abnormal posture: Secondary | ICD-10-CM

## 2022-04-13 DIAGNOSIS — Z170 Estrogen receptor positive status [ER+]: Secondary | ICD-10-CM

## 2022-04-13 DIAGNOSIS — R600 Localized edema: Secondary | ICD-10-CM | POA: Diagnosis not present

## 2022-04-13 DIAGNOSIS — Z483 Aftercare following surgery for neoplasm: Secondary | ICD-10-CM

## 2022-04-13 DIAGNOSIS — C50412 Malignant neoplasm of upper-outer quadrant of left female breast: Secondary | ICD-10-CM | POA: Diagnosis not present

## 2022-04-13 NOTE — Therapy (Signed)
OUTPATIENT PHYSICAL THERAPY ONCOLOGY TREATMENT  Patient Name: Jirah Rider MRN: [id number] DOB:[birth year], 51 y.o., female Today's Date: 04/13/2022   PT End of Session - 04/13/22 0808     Visit Number 4    Number of Visits 9    Date for PT Re-Evaluation 04/23/22    PT Start Time 0810    PT Stop Time 0856    PT Time Calculation (min) 46 min    Activity Tolerance Patient tolerated treatment well    Behavior During Therapy [facility] for tasks assessed/performed              Past Medical History:  Diagnosis Date   Anxiety    Cancer ([place])    breast   Depression    Family history of breast cancer    Family history of lung cancer    Family history of ovarian cancer    GERD (gastroesophageal reflux disease)    diet controlled   History of radiation therapy    Left breast, left axilla- 05/27/21-07/10/21- Dr. [name]   Seasonal allergies    Past Surgical History:  Procedure Laterality Date   BREAST BIOPSY Left 2019   x 2 biopsy   BREAST EXCISIONAL BIOPSY Left    BREAST LUMPECTOMY WITH RADIOACTIVE SEED AND SENTINEL LYMPH NODE BIOPSY Left 02/26/2021   Procedure: LEFT BREAST LUMPECTOMY WITH RADIOACTIVE SEED x2; LEFT AXILLARY SENTINEL NODE BIOPSY;  Surgeon: [name], MD;  Location: [place];  Service: General;  Laterality: Left;   BREAST LUMPECTOMY WITH RADIOFREQUENCY TAG IDENTIFICATION Left 3/82/5053   Procedure: LEFT BREAST MRI WIRE GUIDED EXCISION;  Surgeon: [name], MD;  Location: [place];  Service: General;  Laterality: Left;   BREAST REDUCTION SURGERY Bilateral 04/18/2021   Procedure: MAMMARY REDUCTION  (BREAST) LEFT ONCOPLASTIC BREAST RECONSTRUCTION, RIGHT BREAST REDUCTION;  Surgeon: Irene Limbo, MD;  Location: [place];  Service: Plastics;  Laterality: Bilateral;   CHOLECYSTECTOMY  2008   PORT-A-CATH REMOVAL Right 04/18/2021   Procedure: REMOVAL PORT-A-CATH;  Surgeon: [name], MD;   Location: [place];  Service: General;  Laterality: Right;   PORTACATH PLACEMENT N/A 09/05/2020   Procedure: INSERTION PORT-A-CATH;  Surgeon: [name], MD;  Location: [facility];  Service: General;  Laterality: N/A;  Roy EXTRACTION     Patient Active Problem List   Diagnosis Date Noted   Occipital neuralgia of right side 07/01/2021   Peripheral vertigo 07/01/2021   Genetic testing 09/04/2020   Family history of breast cancer    Family history of ovarian cancer    Family history of lung cancer    Malignant neoplasm of upper-outer quadrant of left breast in female, estrogen receptor positive ([place]) 08/21/2020   Anxiety 11/16/2013   Hypercoagulable state ([place]) 11/16/2013   TIA (transient ischemic attack) 11/15/2013   Chest discomfort 01/20/2012   Factor V Leiden ([place]) 01/20/2012   MVP (mitral valve prolapse) 01/20/2012   Acute bronchitis 12/23/2011    PCP: [name], MD  REFERRING PROVIDER: [name], MD   REFERRING DIAG: [phone] (ICD-10-CM) - Malignant neoplasm of upper-outer quadrant of left breast in female, estrogen receptor positive ([place])  THERAPY DIAG:  Localized edema  Aftercare following surgery for neoplasm  Abnormal posture  Malignant neoplasm of upper-outer quadrant of left breast in female, estrogen receptor positive ([place])  ONSET DATE: 03/03/22  Rationale for Evaluation and Treatment Rehabilitation  SUBJECTIVE  SUBJECTIVE STATEMENT: My left arm is feeling better.  I think the swelling is down.  I am having some pain on the Rt arm they are giving me an xray.  Maybe it is my neck or how I work.  I found my glove.    PERTINENT HISTORY:  Lt lumpectomy with SLNB 0/4 LN on 02/26/21, chemotherapy and radiation completed ; ER/PR+,  HER2-  PAIN:  Are you having pain? Yes NPRS scale: 2/10 Pain location: hand Pain orientation: Left  PAIN TYPE: discomfort Pain description: constant  Aggravating factors: trying to carry something, something tight on arm Relieving factors: nothing  PRECAUTIONS: Other: LUE lymphedema risk  WEIGHT BEARING RESTRICTIONS No  FALLS:  Has patient fallen in last 6 months? No  LIVING ENVIRONMENT: Lives with: lives with their family Lives in: House/apartment Has following equipment at home: None  OCCUPATION: full time, designer  LEISURE: pt is walking 30 min/5 days per week, swimming  HAND DOMINANCE : right   PRIOR LEVEL OF FUNCTION: Independent  PATIENT GOALS to get rid of swelling in the hand   OBJECTIVE  COGNITION:  Overall cognitive status: Within functional limits for tasks assessed   PALPATION: Some cording palpable in L axilla, pt reports deep cording extending down in to forearm, therapist unable to palpate today but pt reports it comes and goes  OBSERVATIONS / OTHER ASSESSMENTS: visible swelling in left hand especially at 2nd digit  POSTURE: forward head and rounded shoulders  UPPER EXTREMITY AROM/PROM:  A/PROM RIGHT   eval   Shoulder extension 68  Shoulder flexion 160  Shoulder abduction 160  Shoulder internal rotation   Shoulder external rotation     (Blank rows = not tested)  A/PROM LEFT   eval  Shoulder extension 70  Shoulder flexion 166  Shoulder abduction 173  Shoulder internal rotation   Shoulder external rotation     (Blank rows = not tested)   LYMPHEDEMA ASSESSMENTS:  SURGERY TYPE/DATE: Left lumpectomy and SLNB 02/26/21 NUMBER OF LYMPH NODES REMOVED: 0/4 CHEMOTHERAPY: completed RADIATION: completed HORMONE TREATMENT: had to stop due to side effects INFECTIONS: none reported   LYMPHEDEMA ASSESSMENTS:  LANDMARK RIGHT  eval  10 cm proximal to olecranon process 33.2  Olecranon process 26.8  10 cm proximal to ulnar styloid process 24   Just proximal to ulnar styloid process 16.5  Across hand at thumb web space 19.9  At base of 2nd digit 6.4  (Blank rows = not tested)  LANDMARK LEFT  eval LEFT 03/30/22  10 cm proximal to olecranon process 34.5   Olecranon process 26.7   10 cm proximal to ulnar styloid process 24.1   Just proximal to ulnar styloid process 16.3 16.6  Across hand at thumb web space 20 19.4  At base of 2nd digit 7 6.7  (Blank rows = not tested)  TODAY'S TREATMENT  04/13/22 MFR to deep cording throughout LUE extending from axilla down to hand. Cording visible during MFR at axilla with skin stretch. MLD to LUE following MFR as follows: short neck, 5 diaphragmatic breaths, R axillary nodes and establishment of interaxillary pathway, L inguinal nodes and establishment of axillo inguinal pathway, L UE working proximal to distal with increased time spent at hand where pt has swelling then retracing all steps.   04/07/22:  Re-did SOZO just to double check which was still WNL.   MFR to deep cording throughout LUE extending from axilla down to hand. Cording visible during MFR at axilla with skin stretch. MLD to LUE  following MFR as follows: short neck, 5 diaphragmatic breaths, R axillary nodes and establishment of interaxillary pathway, L inguinal nodes and establishment of axillo inguinal pathway, L UE working proximal to distal with increased time spent at hand where pt has swelling then retracing all steps.  Applied tg soft to mid forearm, fingers 1-5, artiflex hand  and wrist, then 1 6cm hand bandage.    03/30/22: Remeasured circumferences. Did not bandage at end of session today because pt did not bring bandages back. She threw them away but think she may be able to recover them. MFR to deep cording throughout LUE extending from axilla down to hand. Cording visible during MFR at antecubital fossa. MLD to LUE following MFR as follows: short neck, 5 diaphragmatic breaths, R axillary nodes and establishment of  interaxillary pathway, L inguinal nodes and establishment of axillo inguinal pathway, L UE working proximal to distal with increased time spent at hand where pt has swelling then retracing all steps.   PATIENT EDUCATION:  Education details: lymphedema vs localized edema, availability of Isotoner gloves for compression instead of bandaging, link for self bandaging video, educated pt about proper alignment at work station and that her shoulder should be relaxed when using her pen (like her mouse) and monitor at eye level Person educated: Patient Education method: Explanation Education comprehension: verbalized understanding  HOME EXERCISE PROGRAM: Wear compression bandaging on L hand and remove if they become uncomfortable, loose or cause pain  ASSESSMENT:  CLINICAL IMPRESSION: Edema in hand not noted today but cording is still present.  Continued POC.    OBJECTIVE IMPAIRMENTS decreased knowledge of use of DME, increased edema, increased fascial restrictions, postural dysfunction, and pain.   ACTIVITY LIMITATIONS  none  PARTICIPATION LIMITATIONS:  none  PERSONAL FACTORS  none  are also affecting patient's functional outcome.   REHAB POTENTIAL: Excellent  CLINICAL DECISION MAKING: Stable/uncomplicated  EVALUATION COMPLEXITY: Low  GOALS: Goals reviewed with patient? Yes  SHORT TERM GOALS=LONG TERM GOALS  Target date: 05/11/2022    Pt will demonstrate a 0.3 cm decrease in edema at left 2nd digit to allow improved comfort.  Baseline: Goal status: INITIAL  2.  Pt will either be able to independent bandage her hand or will obtain an appropriate garment to manage hand and finger swelling to allow improved comfort.  Baseline:  Goal status: INITIAL  3.  Pt will be able to verbalize proper work station ergonomics to help improve comfort and decrease muscle strain at work.  Baseline:  Goal status: INITIAL  4.  Pt will report no discomfort from cording throughout LUE to allow  improved comfort.  Baseline:  Goal status: INITIAL   PLAN: PT FREQUENCY: 2x/week  PT DURATION: 4 weeks  PLANNED INTERVENTIONS: Therapeutic exercises, Therapeutic activity, Patient/Family education, Self Care, Joint mobilization, Orthotic/Fit training, Manual lymph drainage, Compression bandaging, scar mobilization, Taping, and Manual therapy  PLAN FOR NEXT SESSION: , give handout on work Scientist, water quality, MLD to L hand, MFR to cording in L UE   Mende Biswell, [place] Prince, PT 04/13/2022, 8:56 AM

## 2022-04-14 ENCOUNTER — Encounter: Payer: Self-pay | Admitting: Family Medicine

## 2022-04-16 NOTE — Progress Notes (Signed)
Patient Care Team: [name], NP as PCP - General (Nurse Practitioner) [name], MD as Consulting Physician (General Surgery) [name], MD as Consulting Physician (Hematology and Oncology) [name], MD as Consulting Physician (Radiation Oncology)  DIAGNOSIS: No diagnosis found.  SUMMARY OF ONCOLOGIC HISTORY: Oncology History  Malignant neoplasm of upper-outer quadrant of left breast in female, estrogen receptor positive ([place])  08/21/2020 Initial Diagnosis   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 No pathogenic variants identified on the [facility] Breast Cancer STAT Panel + Multi-Cancer Panel. [facility] in [facility] called c.983C>T identified. The report date is 09/04/2020.   The STAT Breast cancer panel offered by [facility] includes sequencing and rearrangement analysis for the following 9 genes:  ATM, BRCA1, BRCA2, CDH1, CHEK2, PALB2, PTEN, STK11 and TP53.    The Multi-Cancer Panel offered by [facility] includes sequencing and/or deletion duplication testing of the following 85 genes: AIP, ALK, APC, ATM, AXIN2,BAP1,  BARD1, BLM, BMPR1A, BRCA1, BRCA2, BRIP1, CASR, CDC73, CDH1, CDK4, CDKN1B, CDKN1C, CDKN2A (p14ARF), CDKN2A (p16INK4a), CEBPA, CHEK2, CTNNA1, DICER1, DIS3L2, EGFR (c.2369C>T, p.Thr790Met variant only), EPCAM (Deletion/duplication testing only), FH, FLCN, GATA2, GPC3, GREM1 (Promoter region  deletion/duplication testing only), HOXB13 (c.251G>A, p.Gly84Glu), HRAS, KIT, MAX, MEN1, MET, MITF (c.952G>A, p.Glu318Lys variant only), MLH1, MSH2, MSH3, MSH6, MUTYH, NBN, NF1, NF2, NTHL1, PALB2, PDGFRA, PHOX2B, PMS2, POLD1, POLE, POT1, PRKAR1A, PTCH1, PTEN, RAD50, RAD51C, RAD51D, RB1, RECQL4, RET, RNF43, RUNX1, SDHAF2, SDHA (sequence changes only), SDHB, SDHC, SDHD, SMAD4, SMARCA4, SMARCB1, SMARCE1, STK11, SUFU, TERC, TERT, TMEM127, TP53, TSC1, TSC2, VHL, WRN and WT1.    09/06/2020 - 01/17/2021 Chemotherapy   AC X 4 foll by Taxol       02/26/2021 Surgery   Left lumpectomy: no residual carcinoma with margins and axillary lymph nodes also negative for carcinoma     02/26/2021 Cancer Staging   Staging form: Breast, AJCC 8th Edition - Pathologic stage from 02/26/2021: No Stage Recommended (ypT0, pN0, cM0) - Signed by [name], NP on 12/04/2021 Stage prefix: Post-therapy   04/18/2021 Surgery   Removal of retained clip, port removal and bilateral breast mammoplasty    05/27/2021 - 07/10/2021 Radiation Therapy    Site Technique Total Dose (Gy) Dose per Fx (Gy) Completed Fx Beam Energies  Breast, Left: Breast_Lt 3D 50.4/50.4 1.8 28/28 6X, 10X  Axilla, Left: Axilla_Lt 3D 50.4/50.4 1.8 28/28 6X, 15X    08/2021 -  Anti-estrogen oral therapy   Unable to tolerate tamoxifen or letrozole, changed to exemestane daily 09/18/21     CHIEF COMPLIANT:   INTERVAL HISTORY: Tracy Dyer is a   ALLERGIES:  is allergic to sulfa antibiotics and sulfamethoxazole.  MEDICATIONS:  Current Outpatient Medications  Medication Sig Dispense Refill   albuterol (VENTOLIN HFA) 108 (90 Base) MCG/ACT inhaler Inhale 1-2 puffs into the lungs every 4 (four) hours as needed for wheezing or shortness of breath. 8 g 2   calcium carbonate (TUMS - DOSED IN MG ELEMENTAL  CALCIUM) 500 MG chewable tablet Chew 4 tablets by mouth as needed for indigestion or heartburn.     clonazePAM (KLONOPIN) 1 MG tablet TAKE 1 TABLET  BY MOUTH TWICE A DAY AS NEEDED FOR ANXIETY 60 tablet 1   co-enzyme Q-10 30 MG capsule Take 30 mg by mouth daily.     fluticasone (FLONASE) 50 MCG/ACT nasal spray Place 1 spray into both nostrils daily as needed for allergies.     furosemide (LASIX) 20 MG tablet Take 1 tablet (20 mg total) by mouth daily as needed. 15 tablet 0   l-methylfolate-B6-B12 (METANX) 3-35-2 MG TABS tablet Take 1 tablet by mouth daily.     LORazepam (ATIVAN) 0.5 MG tablet TAKE 1 TABLET BY MOUTH THREE TIMES A DAY AS NEEDED 40 tablet 0   MAGNESIUM CITRATE PO Take 405 mg by mouth daily. 135 mg     meloxicam (MOBIC) 7.5 MG tablet Take 1 tablet (7.5 mg total) by mouth daily as needed for pain. 14 tablet 0   Menaquinone-7 (VITAMIN K2 PO) Take 200 mcg by mouth daily.     Multiple Vitamin (MULTI-VITAMIN PO) Take 1 capsule by mouth daily. Nutrafol hair vitamins     Multiple Vitamins-Minerals (MULTIVITAMIN WITH MINERALS) tablet Take 1 tablet by mouth daily.     Omega-3 Fatty Acids (FISH OIL) 435 MG CAPS Take 450 mg by mouth daily.     omeprazole (PRILOSEC) 20 MG capsule Take 1 capsule (20 mg total) by mouth daily. 30 capsule 3   oxyCODONE (ROXICODONE) 5 MG immediate release tablet Take 1 tablet (5 mg total) by mouth every 4 (four) hours as needed. 20 tablet 0   pseudoephedrine (SUDAFED) 30 MG tablet Take 30 mg by mouth one time in dialysis.     sertraline (ZOLOFT) 100 MG tablet TAKE 1 TABLET BY MOUTH EVERYDAY AT BEDTIME 90 tablet 1   Turmeric (CURCUMIN 95 PO) Take by mouth.     vitamin B-12 (CYANOCOBALAMIN) 100 MCG tablet Take 100 mcg by mouth daily.     VITAMIN D PO Take 1,000 capsules by mouth daily. 1,000 IU     No current facility-administered medications for this visit.    PHYSICAL EXAMINATION: ECOG PERFORMANCE STATUS: {CHL ONC ECOG PS:[phone]}  There were no vitals filed for this visit. There were no vitals filed for this visit.  BREAST:*** No palpable masses or nodules in either right or left breasts. No palpable  axillary supraclavicular or infraclavicular adenopathy no breast tenderness or nipple discharge. (exam performed in the presence of a chaperone)  LABORATORY DATA:  I have reviewed the data as listed    Latest Ref Rng & Units 03/19/2022    1:24 PM 09/04/2021   11:54 AM 01/17/2021   10:15 AM  CMP  Glucose 70 - 99 mg/dL 89  90  97   BUN 6 - 20 mg/dL 12  15  9    Creatinine 0.44 - 1.00 mg/dL 0.88  0.84  0.82   Sodium 135 - 145 mmol/L 140  138  139   Potassium 3.5 - 5.1 mmol/L 4.1  4.8  3.9   Chloride 98 - 111 mmol/L 104  107  109   CO2 22 - 32 mmol/L 27  28  24    Calcium 8.9 - 10.3 mg/dL 9.4  9.5  8.8   Total Protein 6.5 - 8.1 g/dL 7.5  6.6  6.2   Total Bilirubin 0.3 - 1.2 mg/dL 0.5  0.4  0.4   Alkaline Phos 38 - 126 U/L  87  82  60   AST 15 - 41 U/L 37  25  25   ALT 0 - 44 U/L 45  25  31     Lab Results  Component Value Date   WBC 4.9 03/19/2022   HGB 14.6 03/19/2022   HCT 40.6 03/19/2022   MCV 88.3 03/19/2022   PLT 209 03/19/2022   NEUTROABS 2.8 03/19/2022    ASSESSMENT & PLAN:  No problem-specific Assessment & Plan notes found for this encounter.    No orders of the defined types were placed in this encounter.  The patient has a good understanding of the overall plan. she agrees with it. she will call with any problems that may develop before the next visit here. Total time spent: 30 mins including face to face time and time spent for planning, charting and co-ordination of care   Suzzette Righter, [place] 04/16/22    I Gardiner Coins am scribing for Dr. Lindi Adie  ***

## 2022-04-17 ENCOUNTER — Encounter: Payer: Self-pay | Admitting: Rehabilitation

## 2022-04-17 ENCOUNTER — Ambulatory Visit: Payer: BC Managed Care – PPO | Admitting: Rehabilitation

## 2022-04-17 DIAGNOSIS — Z483 Aftercare following surgery for neoplasm: Secondary | ICD-10-CM | POA: Diagnosis not present

## 2022-04-17 DIAGNOSIS — C50412 Malignant neoplasm of upper-outer quadrant of left female breast: Secondary | ICD-10-CM

## 2022-04-17 DIAGNOSIS — Z170 Estrogen receptor positive status [ER+]: Secondary | ICD-10-CM | POA: Diagnosis not present

## 2022-04-17 DIAGNOSIS — R600 Localized edema: Secondary | ICD-10-CM

## 2022-04-17 DIAGNOSIS — R293 Abnormal posture: Secondary | ICD-10-CM

## 2022-04-17 NOTE — Therapy (Signed)
OUTPATIENT PHYSICAL THERAPY ONCOLOGY TREATMENT  Patient Name: Shadasia Oldfield MRN: [id number] DOB:May 27, 1971, 51 y.o., female Today's Date: 04/17/2022   PT End of Session - 04/17/22 1110     Visit Number 5    Number of Visits 9    Date for PT Re-Evaluation 04/23/22    PT Start Time 1115   late   PT Stop Time 1157    PT Time Calculation (min) 42 min    Activity Tolerance Patient tolerated treatment well    Behavior During Therapy WFL for tasks assessed/performed              Past Medical History:  Diagnosis Date   Anxiety    Cancer ([place])    breast   Depression    Family history of breast cancer    Family history of lung cancer    Family history of ovarian cancer    GERD (gastroesophageal reflux disease)    diet controlled   History of radiation therapy    Left breast, left axilla- 05/27/21-07/10/21- Dr. [name]   Seasonal allergies    Past Surgical History:  Procedure Laterality Date   BREAST BIOPSY Left 2019   x 2 biopsy   BREAST EXCISIONAL BIOPSY Left    BREAST LUMPECTOMY WITH RADIOACTIVE SEED AND SENTINEL LYMPH NODE BIOPSY Left 02/26/2021   Procedure: LEFT BREAST LUMPECTOMY WITH RADIOACTIVE SEED x2; LEFT AXILLARY SENTINEL NODE BIOPSY;  Surgeon: [name], MD;  Location: [place];  Service: General;  Laterality: Left;   BREAST LUMPECTOMY WITH RADIOFREQUENCY TAG IDENTIFICATION Left 0/10/7046   Procedure: LEFT BREAST MRI WIRE GUIDED EXCISION;  Surgeon: [name], MD;  Location: [place];  Service: General;  Laterality: Left;   BREAST REDUCTION SURGERY Bilateral 04/18/2021   Procedure: MAMMARY REDUCTION  (BREAST) LEFT ONCOPLASTIC BREAST RECONSTRUCTION, RIGHT BREAST REDUCTION;  Surgeon: Irene Limbo, MD;  Location: [place];  Service: Plastics;  Laterality: Bilateral;   CHOLECYSTECTOMY  2008   PORT-A-CATH REMOVAL Right 04/18/2021   Procedure: REMOVAL PORT-A-CATH;  Surgeon: [name],  MD;  Location: [place];  Service: General;  Laterality: Right;   PORTACATH PLACEMENT N/A 09/05/2020   Procedure: INSERTION PORT-A-CATH;  Surgeon: [name], MD;  Location: [facility] OR;  Service: General;  Laterality: N/A;  [place] EXTRACTION     Patient Active Problem List   Diagnosis Date Noted   Occipital neuralgia of right side 07/01/2021   Peripheral vertigo 07/01/2021   Genetic testing 09/04/2020   Family history of breast cancer    Family history of ovarian cancer    Family history of lung cancer    Malignant neoplasm of upper-outer quadrant of left breast in female, estrogen receptor positive ([place]) 08/21/2020   Anxiety 11/16/2013   Hypercoagulable state ([place]) 11/16/2013   TIA (transient ischemic attack) 11/15/2013   Chest discomfort 01/20/2012   Factor V Leiden ([place]) 01/20/2012   MVP (mitral valve prolapse) 01/20/2012   Acute bronchitis 12/23/2011    PCP: [name], MD  REFERRING PROVIDER: [name], MD   REFERRING DIAG: [phone] (ICD-10-CM) - Malignant neoplasm of upper-outer quadrant of left breast in female, estrogen receptor positive ([place])  THERAPY DIAG:  Localized edema  Aftercare following surgery for neoplasm  Abnormal posture  Malignant neoplasm of upper-outer quadrant of left breast in female, estrogen receptor positive ([place])  ONSET DATE: 03/03/22  Rationale for Evaluation and Treatment Rehabilitation  SUBJECTIVE  SUBJECTIVE STATEMENT: I think I am doing ok with all of this  PERTINENT HISTORY:  Lt lumpectomy with SLNB 0/4 LN on 02/26/21, chemotherapy and radiation completed ; ER/PR+, HER2-  PAIN:  Are you having pain? Yes NPRS scale: 2/10 Pain location: hand Pain orientation: Left  PAIN TYPE: discomfort Pain  description: constant  Aggravating factors: trying to carry something, something tight on arm Relieving factors: nothing  PRECAUTIONS: Other: LUE lymphedema risk  WEIGHT BEARING RESTRICTIONS No  FALLS:  Has patient fallen in last 6 months? No  LIVING ENVIRONMENT: Lives with: lives with their family Lives in: House/apartment Has following equipment at home: None  OCCUPATION: full time, designer  LEISURE: pt is walking 30 min/5 days per week, swimming  HAND DOMINANCE : right   PRIOR LEVEL OF FUNCTION: Independent  PATIENT GOALS to get rid of swelling in the hand   OBJECTIVE  COGNITION:  Overall cognitive status: Within functional limits for tasks assessed   PALPATION: Some cording palpable in L axilla, pt reports deep cording extending down in to forearm, therapist unable to palpate today but pt reports it comes and goes  OBSERVATIONS / OTHER ASSESSMENTS: visible swelling in left hand especially at 2nd digit  POSTURE: forward head and rounded shoulders  UPPER EXTREMITY AROM/PROM:  A/PROM RIGHT   eval   Shoulder extension 68  Shoulder flexion 160  Shoulder abduction 160  Shoulder internal rotation   Shoulder external rotation     (Blank rows = not tested)  A/PROM LEFT   eval  Shoulder extension 70  Shoulder flexion 166  Shoulder abduction 173  Shoulder internal rotation   Shoulder external rotation     (Blank rows = not tested)   LYMPHEDEMA ASSESSMENTS:  SURGERY TYPE/DATE: Left lumpectomy and SLNB 02/26/21 NUMBER OF LYMPH NODES REMOVED: 0/4 CHEMOTHERAPY: completed RADIATION: completed HORMONE TREATMENT: had to stop due to side effects INFECTIONS: none reported   LYMPHEDEMA ASSESSMENTS:  LANDMARK RIGHT  eval  10 cm proximal to olecranon process 33.2  Olecranon process 26.8  10 cm proximal to ulnar styloid process 24  Just proximal to ulnar styloid process 16.5  Across hand at thumb web space 19.9  At base of 2nd digit 6.4  (Blank rows = not  tested)  LANDMARK LEFT  eval LEFT 03/30/22  10 cm proximal to olecranon process 34.5   Olecranon process 26.7   10 cm proximal to ulnar styloid process 24.1   Just proximal to ulnar styloid process 16.3 16.6  Across hand at thumb web space 20 19.4  At base of 2nd digit 7 6.7  (Blank rows = not tested)  TODAY'S TREATMENT  04/17/22 MFR to deep cording throughout LUE extending from axilla down to hand. Cording not visible during MFR at axilla with skin stretch but pt still feels the pull. MLD to LUE following MFR as follows: short neck, 5 diaphragmatic breaths, R axillary nodes and establishment of interaxillary pathway, L inguinal nodes and establishment of axillo inguinal pathway, L UE working proximal to distal with increased time spent at hand where pt has swelling then retracing all steps.   04/13/22 MFR to deep cording throughout LUE extending from axilla down to hand. Cording visible during MFR at axilla with skin stretch. MLD to LUE following MFR as follows: short neck, 5 diaphragmatic breaths, R axillary nodes and establishment of interaxillary pathway, L inguinal nodes and establishment of axillo inguinal pathway, L UE working proximal to distal with increased time spent at hand where pt has swelling  then retracing all steps.   04/07/22:  Re-did SOZO just to double check which was still WNL.   MFR to deep cording throughout LUE extending from axilla down to hand. Cording visible during MFR at axilla with skin stretch. MLD to LUE following MFR as follows: short neck, 5 diaphragmatic breaths, R axillary nodes and establishment of interaxillary pathway, L inguinal nodes and establishment of axillo inguinal pathway, L UE working proximal to distal with increased time spent at hand where pt has swelling then retracing all steps.  Applied tg soft to mid forearm, fingers 1-5, artiflex hand  and wrist, then 1 6cm hand bandage.    03/30/22: Remeasured circumferences. Did not bandage at end of session  today because pt did not bring bandages back. She threw them away but think she may be able to recover them. MFR to deep cording throughout LUE extending from axilla down to hand. Cording visible during MFR at antecubital fossa. MLD to LUE following MFR as follows: short neck, 5 diaphragmatic breaths, R axillary nodes and establishment of interaxillary pathway, L inguinal nodes and establishment of axillo inguinal pathway, L UE working proximal to distal with increased time spent at hand where pt has swelling then retracing all steps.   PATIENT EDUCATION:  Education details: lymphedema vs localized edema, availability of Isotoner gloves for compression instead of bandaging, link for self bandaging video, educated pt about proper alignment at work station and that her shoulder should be relaxed when using her pen (like her mouse) and monitor at eye level Person educated: Patient Education method: Explanation Education comprehension: verbalized understanding  HOME EXERCISE PROGRAM: Wear compression bandaging on L hand and remove if they become uncomfortable, loose or cause pain  ASSESSMENT:  CLINICAL IMPRESSION: Pt will attempt DC today as she is having less swelling and has her glove   OBJECTIVE IMPAIRMENTS decreased knowledge of use of DME, increased edema, increased fascial restrictions, postural dysfunction, and pain.   ACTIVITY LIMITATIONS  none  PARTICIPATION LIMITATIONS:  none  PERSONAL FACTORS  none  are also affecting patient's functional outcome.   REHAB POTENTIAL: Excellent  CLINICAL DECISION MAKING: Stable/uncomplicated  EVALUATION COMPLEXITY: Low  GOALS: Goals reviewed with patient? Yes  SHORT TERM GOALS=LONG TERM GOALS  Target date: 05/15/2022    Pt will demonstrate a 0.3 cm decrease in edema at left 2nd digit to allow improved comfort.  Baseline: Goal status: INITIAL  2.  Pt will either be able to independent bandage her hand or will obtain an appropriate garment  to manage hand and finger swelling to allow improved comfort.  Baseline:  Goal status: INITIAL  3.  Pt will be able to verbalize proper work station ergonomics to help improve comfort and decrease muscle strain at work.  Baseline:  Goal status: INITIAL  4.  Pt will report no discomfort from cording throughout LUE to allow improved comfort.  Baseline:  Goal status: INITIAL   PLAN: PT FREQUENCY: 2x/week  PT DURATION: 4 weeks  PLANNED INTERVENTIONS: Therapeutic exercises, Therapeutic activity, Patient/Family education, Self Care, Joint mobilization, Orthotic/Fit training, Manual lymph drainage, Compression bandaging, scar mobilization, Taping, and Manual therapy  PLAN FOR NEXT SESSION: , Ander Slade, PT 04/17/2022, 11:59 AM

## 2022-04-20 ENCOUNTER — Ambulatory Visit: Payer: BC Managed Care – PPO | Admitting: Physical Therapy

## 2022-04-20 ENCOUNTER — Other Ambulatory Visit: Payer: Self-pay

## 2022-04-20 DIAGNOSIS — C50412 Malignant neoplasm of upper-outer quadrant of left female breast: Secondary | ICD-10-CM

## 2022-04-21 ENCOUNTER — Other Ambulatory Visit: Payer: Self-pay

## 2022-04-21 ENCOUNTER — Inpatient Hospital Stay: Payer: BC Managed Care – PPO | Attending: Adult Health | Admitting: Hematology and Oncology

## 2022-04-21 ENCOUNTER — Other Ambulatory Visit: Payer: Self-pay | Admitting: Adult Health

## 2022-04-21 ENCOUNTER — Inpatient Hospital Stay: Payer: BC Managed Care – PPO

## 2022-04-21 DIAGNOSIS — Z170 Estrogen receptor positive status [ER+]: Secondary | ICD-10-CM | POA: Diagnosis not present

## 2022-04-21 DIAGNOSIS — C50412 Malignant neoplasm of upper-outer quadrant of left female breast: Secondary | ICD-10-CM

## 2022-04-21 DIAGNOSIS — M47892 Other spondylosis, cervical region: Secondary | ICD-10-CM | POA: Diagnosis not present

## 2022-04-21 DIAGNOSIS — R278 Other lack of coordination: Secondary | ICD-10-CM | POA: Diagnosis not present

## 2022-04-21 DIAGNOSIS — Z923 Personal history of irradiation: Secondary | ICD-10-CM | POA: Insufficient documentation

## 2022-04-21 DIAGNOSIS — H8113 Benign paroxysmal vertigo, bilateral: Secondary | ICD-10-CM | POA: Diagnosis not present

## 2022-04-21 DIAGNOSIS — R519 Headache, unspecified: Secondary | ICD-10-CM | POA: Diagnosis not present

## 2022-04-21 LAB — CMP (CANCER CENTER ONLY)
ALT: 37 U/L (ref 0–44)
AST: 30 U/L (ref 15–41)
Albumin: 4.2 g/dL (ref 3.5–5.0)
Alkaline Phosphatase: 85 U/L (ref 38–126)
Anion gap: 4 — ABNORMAL LOW (ref 5–15)
BUN: 14 mg/dL (ref 6–20)
CO2: 27 mmol/L (ref 22–32)
Calcium: 9.1 mg/dL (ref 8.9–10.3)
Chloride: 106 mmol/L (ref 98–111)
Creatinine: 0.81 mg/dL (ref 0.44–1.00)
GFR, Estimated: >60 mL/min (ref >60)
Glucose, Bld: 104 mg/dL — ABNORMAL HIGH (ref 70–99)
Potassium: 4.4 mmol/L (ref 3.5–5.1)
Sodium: 137 mmol/L (ref 135–145)
Total Bilirubin: 0.4 mg/dL (ref 0.3–1.2)
Total Protein: 6.7 g/dL (ref 6.5–8.1)

## 2022-04-21 LAB — CBC WITH DIFFERENTIAL (CANCER CENTER ONLY)
Abs Immature Granulocytes: 0.02 10*3/uL (ref 0.00–0.07)
Basophils Absolute: 0.1 10*3/uL (ref 0.0–0.1)
Basophils Relative: 1 %
Eosinophils Absolute: 0.2 10*3/uL (ref 0.0–0.5)
Eosinophils Relative: 3 %
HCT: 40.4 % (ref 36.0–46.0)
Hemoglobin: 14.3 g/dL (ref 12.0–15.0)
Immature Granulocytes: 0 %
Lymphocytes Relative: 27 %
Lymphs Abs: 1.3 10*3/uL (ref 0.7–4.0)
MCH: 32.1 pg (ref 26.0–34.0)
MCHC: 35.4 g/dL (ref 30.0–36.0)
MCV: 90.6 fL (ref 80.0–100.0)
Monocytes Absolute: 0.4 10*3/uL (ref 0.1–1.0)
Monocytes Relative: 9 %
Neutro Abs: 2.9 10*3/uL (ref 1.7–7.7)
Neutrophils Relative %: 60 %
Platelet Count: 200 10*3/uL (ref 150–400)
RBC: 4.46 MIL/uL (ref 3.87–5.11)
RDW: 13.3 % (ref 11.5–15.5)
WBC Count: 4.9 10*3/uL (ref 4.0–10.5)
nRBC: 0 % (ref 0.0–0.2)

## 2022-04-21 NOTE — Progress Notes (Signed)
Orders entered for signatera testing per MD. Requisition and all supporting documents faxed to [phone] with fax confirmation.   

## 2022-04-21 NOTE — Assessment & Plan Note (Addendum)
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%, PR 95%, HER2 equivocal, FISH pending, Ki-67 20% T1CN1 stage IIa MammaPrint: High risk: Luminal type B, probability of path CR 6% with chemo and antiestrogen therapy predicted benefit of treatment at 5 years 94.6%, average 10-year risk of recurrence untreated: 29%  Treatment plan: 1.Neoadjuvant chemotherapy with dose dense Adriamycin and Cytoxan followed by Taxol weekly x12completed 01/17/2021 2.7/27/2022breast conserving surgery with sentinel lymph node and targeted node dissection,: Path CR 0/4 LN Neg 04/18/21: Bilateral Mammoplasty: Benign 3.Adjuvant radiation therapy10/26/22- 07/10/21 4.Followed by adjuvant antiestrogen therapy with tamoxifen--unable to tolerate, on Letrozole daily switched to exemestane discontinued for intolerance -------------------------------------------------------------------------------------------------------------------------   PET CT 10/01/21: No evidence of metastatic disease postradiation changes in the lungs Based on severe intolerance antiestrogen therapy we decided not to pursue any further antiestrogen treatments.  Breast cancer surveillance: 1.  Annual mammograms and breast MRIs 2. Signet testing for minimal residual disease  I gave her a prescription for bras as well as we will refer her to [facility] for massage therapy. Return to clinic in 6 months for follow-up. After that we can see her once a year.

## 2022-04-22 ENCOUNTER — Telehealth: Payer: Self-pay | Admitting: Hematology and Oncology

## 2022-04-22 NOTE — Telephone Encounter (Signed)
Left patient a message about scheduled appointment 3/19

## 2022-04-24 ENCOUNTER — Encounter: Payer: BC Managed Care – PPO | Admitting: Rehabilitation

## 2022-04-29 DIAGNOSIS — R519 Headache, unspecified: Secondary | ICD-10-CM | POA: Diagnosis not present

## 2022-04-29 DIAGNOSIS — M47892 Other spondylosis, cervical region: Secondary | ICD-10-CM | POA: Diagnosis not present

## 2022-04-29 DIAGNOSIS — C50412 Malignant neoplasm of upper-outer quadrant of left female breast: Secondary | ICD-10-CM | POA: Diagnosis not present

## 2022-04-29 DIAGNOSIS — Z170 Estrogen receptor positive status [ER+]: Secondary | ICD-10-CM | POA: Diagnosis not present

## 2022-04-29 DIAGNOSIS — R278 Other lack of coordination: Secondary | ICD-10-CM | POA: Diagnosis not present

## 2022-04-29 DIAGNOSIS — H8113 Benign paroxysmal vertigo, bilateral: Secondary | ICD-10-CM | POA: Diagnosis not present

## 2022-05-05 ENCOUNTER — Encounter: Payer: Self-pay | Admitting: Hematology and Oncology

## 2022-05-05 ENCOUNTER — Encounter: Payer: Self-pay | Admitting: Adult Health

## 2022-05-05 DIAGNOSIS — L298 Other pruritus: Secondary | ICD-10-CM | POA: Diagnosis not present

## 2022-05-05 DIAGNOSIS — D225 Melanocytic nevi of trunk: Secondary | ICD-10-CM | POA: Diagnosis not present

## 2022-05-05 DIAGNOSIS — L814 Other melanin hyperpigmentation: Secondary | ICD-10-CM | POA: Diagnosis not present

## 2022-05-05 DIAGNOSIS — L821 Other seborrheic keratosis: Secondary | ICD-10-CM | POA: Diagnosis not present

## 2022-05-05 DIAGNOSIS — R208 Other disturbances of skin sensation: Secondary | ICD-10-CM | POA: Diagnosis not present

## 2022-05-05 DIAGNOSIS — L918 Other hypertrophic disorders of the skin: Secondary | ICD-10-CM | POA: Diagnosis not present

## 2022-05-05 DIAGNOSIS — L538 Other specified erythematous conditions: Secondary | ICD-10-CM | POA: Diagnosis not present

## 2022-05-22 ENCOUNTER — Ambulatory Visit (HOSPITAL_BASED_OUTPATIENT_CLINIC_OR_DEPARTMENT_OTHER)
Admission: RE | Admit: 2022-05-22 | Discharge: 2022-05-22 | Disposition: A | Discharge status: Home / Self Care | Payer: BC Managed Care – PPO | Source: Ambulatory Visit | Attending: Nurse Practitioner | Admitting: Nurse Practitioner

## 2022-05-22 ENCOUNTER — Other Ambulatory Visit (HOSPITAL_BASED_OUTPATIENT_CLINIC_OR_DEPARTMENT_OTHER): Payer: Self-pay | Admitting: Nurse Practitioner

## 2022-05-22 DIAGNOSIS — M79661 Pain in right lower leg: Secondary | ICD-10-CM

## 2022-05-22 DIAGNOSIS — R52 Pain, unspecified: Secondary | ICD-10-CM | POA: Diagnosis not present

## 2022-05-22 DIAGNOSIS — J0140 Acute pansinusitis, unspecified: Secondary | ICD-10-CM | POA: Diagnosis not present

## 2022-05-22 DIAGNOSIS — C50912 Malignant neoplasm of unspecified site of left female breast: Secondary | ICD-10-CM | POA: Diagnosis not present

## 2022-05-22 DIAGNOSIS — M25511 Pain in right shoulder: Secondary | ICD-10-CM | POA: Diagnosis not present

## 2022-05-22 LAB — SIGNATERA ONLY (NATERA MANAGED)
SIGNATERA MTM READOUT: 0 MTM/ml
SIGNATERA TEST RESULT: NEGATIVE

## 2022-05-25 ENCOUNTER — Encounter: Payer: Self-pay | Admitting: Hematology and Oncology

## 2022-05-29 DIAGNOSIS — Z0000 Encounter for general adult medical examination without abnormal findings: Secondary | ICD-10-CM | POA: Diagnosis not present

## 2022-05-29 DIAGNOSIS — Z23 Encounter for immunization: Secondary | ICD-10-CM | POA: Diagnosis not present

## 2022-05-29 DIAGNOSIS — Z13220 Encounter for screening for lipoid disorders: Secondary | ICD-10-CM | POA: Diagnosis not present

## 2022-05-29 DIAGNOSIS — R161 Splenomegaly, not elsewhere classified: Secondary | ICD-10-CM | POA: Diagnosis not present

## 2022-05-29 DIAGNOSIS — Z130 Encounter for screening for diseases of the blood and blood-forming organs and certain disorders involving the immune mechanism: Secondary | ICD-10-CM | POA: Diagnosis not present

## 2022-06-02 DIAGNOSIS — M25511 Pain in right shoulder: Secondary | ICD-10-CM | POA: Diagnosis not present

## 2022-06-02 DIAGNOSIS — M75100 Unspecified rotator cuff tear or rupture of unspecified shoulder, not specified as traumatic: Secondary | ICD-10-CM | POA: Diagnosis not present

## 2022-06-02 DIAGNOSIS — G8929 Other chronic pain: Secondary | ICD-10-CM | POA: Diagnosis not present

## 2022-06-10 DIAGNOSIS — M7541 Impingement syndrome of right shoulder: Secondary | ICD-10-CM | POA: Diagnosis not present

## 2022-06-10 DIAGNOSIS — M9902 Segmental and somatic dysfunction of thoracic region: Secondary | ICD-10-CM | POA: Diagnosis not present

## 2022-06-10 DIAGNOSIS — M9907 Segmental and somatic dysfunction of upper extremity: Secondary | ICD-10-CM | POA: Diagnosis not present

## 2022-06-10 DIAGNOSIS — M9901 Segmental and somatic dysfunction of cervical region: Secondary | ICD-10-CM | POA: Diagnosis not present

## 2022-06-10 DIAGNOSIS — M5412 Radiculopathy, cervical region: Secondary | ICD-10-CM | POA: Diagnosis not present

## 2022-06-12 DIAGNOSIS — M9901 Segmental and somatic dysfunction of cervical region: Secondary | ICD-10-CM | POA: Diagnosis not present

## 2022-06-12 DIAGNOSIS — M9902 Segmental and somatic dysfunction of thoracic region: Secondary | ICD-10-CM | POA: Diagnosis not present

## 2022-06-12 DIAGNOSIS — M7541 Impingement syndrome of right shoulder: Secondary | ICD-10-CM | POA: Diagnosis not present

## 2022-06-12 DIAGNOSIS — M9907 Segmental and somatic dysfunction of upper extremity: Secondary | ICD-10-CM | POA: Diagnosis not present

## 2022-06-15 ENCOUNTER — Ambulatory Visit: Payer: BC Managed Care – PPO | Attending: Hematology and Oncology

## 2022-06-15 DIAGNOSIS — Z170 Estrogen receptor positive status [ER+]: Secondary | ICD-10-CM | POA: Insufficient documentation

## 2022-06-15 DIAGNOSIS — Z483 Aftercare following surgery for neoplasm: Secondary | ICD-10-CM | POA: Insufficient documentation

## 2022-06-15 DIAGNOSIS — R600 Localized edema: Secondary | ICD-10-CM | POA: Insufficient documentation

## 2022-06-15 DIAGNOSIS — C50412 Malignant neoplasm of upper-outer quadrant of left female breast: Secondary | ICD-10-CM | POA: Insufficient documentation

## 2022-06-15 DIAGNOSIS — R293 Abnormal posture: Secondary | ICD-10-CM | POA: Insufficient documentation

## 2022-06-22 DIAGNOSIS — M9907 Segmental and somatic dysfunction of upper extremity: Secondary | ICD-10-CM | POA: Diagnosis not present

## 2022-06-22 DIAGNOSIS — M9901 Segmental and somatic dysfunction of cervical region: Secondary | ICD-10-CM | POA: Diagnosis not present

## 2022-06-22 DIAGNOSIS — M9902 Segmental and somatic dysfunction of thoracic region: Secondary | ICD-10-CM | POA: Diagnosis not present

## 2022-06-22 DIAGNOSIS — M7541 Impingement syndrome of right shoulder: Secondary | ICD-10-CM | POA: Diagnosis not present

## 2022-06-23 DIAGNOSIS — M47812 Spondylosis without myelopathy or radiculopathy, cervical region: Secondary | ICD-10-CM | POA: Diagnosis not present

## 2022-06-23 DIAGNOSIS — M19011 Primary osteoarthritis, right shoulder: Secondary | ICD-10-CM | POA: Diagnosis not present

## 2022-06-23 DIAGNOSIS — M4802 Spinal stenosis, cervical region: Secondary | ICD-10-CM | POA: Diagnosis not present

## 2022-07-02 DIAGNOSIS — H8113 Benign paroxysmal vertigo, bilateral: Secondary | ICD-10-CM | POA: Diagnosis not present

## 2022-07-02 DIAGNOSIS — M47892 Other spondylosis, cervical region: Secondary | ICD-10-CM | POA: Diagnosis not present

## 2022-07-02 DIAGNOSIS — R519 Headache, unspecified: Secondary | ICD-10-CM | POA: Diagnosis not present

## 2022-07-02 DIAGNOSIS — R278 Other lack of coordination: Secondary | ICD-10-CM | POA: Diagnosis not present

## 2022-07-03 DIAGNOSIS — M25511 Pain in right shoulder: Secondary | ICD-10-CM | POA: Diagnosis not present

## 2022-07-03 DIAGNOSIS — M5412 Radiculopathy, cervical region: Secondary | ICD-10-CM | POA: Diagnosis not present

## 2022-07-06 ENCOUNTER — Other Ambulatory Visit: Payer: Self-pay | Admitting: Hematology and Oncology

## 2022-07-07 DIAGNOSIS — M9902 Segmental and somatic dysfunction of thoracic region: Secondary | ICD-10-CM | POA: Diagnosis not present

## 2022-07-07 DIAGNOSIS — M9901 Segmental and somatic dysfunction of cervical region: Secondary | ICD-10-CM | POA: Diagnosis not present

## 2022-07-07 DIAGNOSIS — M7541 Impingement syndrome of right shoulder: Secondary | ICD-10-CM | POA: Diagnosis not present

## 2022-07-07 DIAGNOSIS — M9907 Segmental and somatic dysfunction of upper extremity: Secondary | ICD-10-CM | POA: Diagnosis not present

## 2022-07-08 DIAGNOSIS — M5412 Radiculopathy, cervical region: Secondary | ICD-10-CM | POA: Insufficient documentation

## 2022-07-09 DIAGNOSIS — M9902 Segmental and somatic dysfunction of thoracic region: Secondary | ICD-10-CM | POA: Diagnosis not present

## 2022-07-09 DIAGNOSIS — M9901 Segmental and somatic dysfunction of cervical region: Secondary | ICD-10-CM | POA: Diagnosis not present

## 2022-07-09 DIAGNOSIS — M9907 Segmental and somatic dysfunction of upper extremity: Secondary | ICD-10-CM | POA: Diagnosis not present

## 2022-07-09 DIAGNOSIS — M7541 Impingement syndrome of right shoulder: Secondary | ICD-10-CM | POA: Diagnosis not present

## 2022-07-10 DIAGNOSIS — R300 Dysuria: Secondary | ICD-10-CM | POA: Diagnosis not present

## 2022-07-10 DIAGNOSIS — D6859 Other primary thrombophilia: Secondary | ICD-10-CM | POA: Diagnosis not present

## 2022-07-10 DIAGNOSIS — N3000 Acute cystitis without hematuria: Secondary | ICD-10-CM | POA: Diagnosis not present

## 2022-07-20 DIAGNOSIS — M9907 Segmental and somatic dysfunction of upper extremity: Secondary | ICD-10-CM | POA: Diagnosis not present

## 2022-07-20 DIAGNOSIS — M9901 Segmental and somatic dysfunction of cervical region: Secondary | ICD-10-CM | POA: Diagnosis not present

## 2022-07-20 DIAGNOSIS — M7541 Impingement syndrome of right shoulder: Secondary | ICD-10-CM | POA: Diagnosis not present

## 2022-07-20 DIAGNOSIS — M9902 Segmental and somatic dysfunction of thoracic region: Secondary | ICD-10-CM | POA: Diagnosis not present

## 2022-07-21 NOTE — Therapy (Unsigned)
OUTPATIENT PHYSICAL THERAPY SOZO SCREENING NOTE   Patient Name: Tracy Dyer MRN: [id number] DOB:1971/01/22, 51 y.o., female Today's Date: 07/21/2022  PCP: [name], NP REFERRING PROVIDER: [name], MD    Past Medical History:  Diagnosis Date   Anxiety    Cancer [facility])    breast   Depression    Family history of breast cancer    Family history of lung cancer    Family history of ovarian cancer    GERD (gastroesophageal reflux disease)    diet controlled   History of radiation therapy    Left breast, left axilla- 05/27/21-07/10/21- Dr. [name]   Seasonal allergies    Past Surgical History:  Procedure Laterality Date   BREAST BIOPSY Left 2019   x 2 biopsy   BREAST EXCISIONAL BIOPSY Left    BREAST LUMPECTOMY WITH RADIOACTIVE SEED AND SENTINEL LYMPH NODE BIOPSY Left 02/26/2021   Procedure: LEFT BREAST LUMPECTOMY WITH RADIOACTIVE SEED x2; LEFT AXILLARY SENTINEL NODE BIOPSY;  Surgeon: [name], MD;  Location: [place];  Service: General;  Laterality: Left;   BREAST LUMPECTOMY WITH RADIOFREQUENCY TAG IDENTIFICATION Left 7/82/9562   Procedure: LEFT BREAST MRI WIRE GUIDED EXCISION;  Surgeon: [name], MD;  Location: [place];  Service: General;  Laterality: Left;   BREAST REDUCTION SURGERY Bilateral 04/18/2021   Procedure: MAMMARY REDUCTION  (BREAST) LEFT ONCOPLASTIC BREAST RECONSTRUCTION, RIGHT BREAST REDUCTION;  Surgeon: Irene Limbo, MD;  Location: [place];  Service: Plastics;  Laterality: Bilateral;   CHOLECYSTECTOMY  2008   PORT-A-CATH REMOVAL Right 04/18/2021   Procedure: REMOVAL PORT-A-CATH;  Surgeon: [name], MD;  Location: [place];  Service: General;  Laterality: Right;   PORTACATH PLACEMENT N/A 09/05/2020   Procedure: INSERTION PORT-A-CATH;  Surgeon: [name], MD;  Location: [place];  Service: General;  Laterality: N/A;  [place] EXTRACTION     Patient Active Problem List   Diagnosis Date Noted   Occipital neuralgia of right side 07/01/2021   Peripheral vertigo 07/01/2021   Genetic testing 09/04/2020   Family history of breast cancer    Family history of ovarian cancer    Family history of lung cancer    Malignant neoplasm of upper-outer quadrant of left breast in female, estrogen receptor positive ([place]) 08/21/2020   Anxiety 11/16/2013   Hypercoagulable state ([place]) 11/16/2013   TIA (transient ischemic attack) 11/15/2013   Chest discomfort 01/20/2012   Factor V Leiden ([place]) 01/20/2012   MVP (mitral valve prolapse) 01/20/2012   Acute bronchitis 12/23/2011    REFERRING DIAG: left breast cancer at risk for lymphedema  THERAPY DIAG:  No diagnosis found.  PERTINENT HISTORY: Lt lumpectomy with SLNB 0/4 LN on 02/26/21, chemotherapy and radiation completed ; ER/PR+, HER2-  Had issue with hand swelling in summer 2023 and cording  PRECAUTIONS: left UE Lymphedema risk,   SUBJECTIVE: ***  PAIN:  Are you having pain? {[id number]}  SOZO SCREENING: Patient was assessed today using the SOZO machine to determine the lymphedema index score. This was compared to her baseline score. It was determined that she is within the recommended range when compared to her baseline and no further action is needed at this time. She will continue SOZO screenings. These are done every 3 months for 2 years post operatively followed by every 6 months for 2 years, and then annually.  Patient was assessed today using the SOZO machine to determine the lymphedema index score. This was  compared to her baseline score. It was determined that she is NOT within the recommended range when compared to her baseline and so she was fitted for a compression garment while in the clinic today. It is recommended she return in 1 month to be reassessed. If she continues to measure outside the recommended range, physical therapy treatment will be  recommended at that time and a referral requested.    Claris Pong, PT 07/21/2022, 5:31 PM

## 2022-07-22 ENCOUNTER — Other Ambulatory Visit: Payer: Self-pay | Admitting: *Deleted

## 2022-07-22 ENCOUNTER — Ambulatory Visit: Payer: BC Managed Care – PPO | Attending: Hematology and Oncology | Admitting: Rehabilitation

## 2022-07-22 DIAGNOSIS — Z170 Estrogen receptor positive status [ER+]: Secondary | ICD-10-CM | POA: Insufficient documentation

## 2022-07-22 DIAGNOSIS — C50412 Malignant neoplasm of upper-outer quadrant of left female breast: Secondary | ICD-10-CM | POA: Insufficient documentation

## 2022-07-22 DIAGNOSIS — M26609 Unspecified temporomandibular joint disorder, unspecified side: Secondary | ICD-10-CM | POA: Diagnosis not present

## 2022-07-22 DIAGNOSIS — J3489 Other specified disorders of nose and nasal sinuses: Secondary | ICD-10-CM | POA: Diagnosis not present

## 2022-07-22 DIAGNOSIS — H9201 Otalgia, right ear: Secondary | ICD-10-CM | POA: Diagnosis not present

## 2022-07-22 DIAGNOSIS — R293 Abnormal posture: Secondary | ICD-10-CM | POA: Insufficient documentation

## 2022-07-22 DIAGNOSIS — I890 Lymphedema, not elsewhere classified: Secondary | ICD-10-CM

## 2022-07-22 DIAGNOSIS — R600 Localized edema: Secondary | ICD-10-CM | POA: Insufficient documentation

## 2022-07-22 DIAGNOSIS — Z483 Aftercare following surgery for neoplasm: Secondary | ICD-10-CM | POA: Insufficient documentation

## 2022-07-22 DIAGNOSIS — M542 Cervicalgia: Secondary | ICD-10-CM | POA: Diagnosis not present

## 2022-07-22 NOTE — Patient Instructions (Signed)
PLEASE KEEP YOUR COMPRESSION GARMENT ON DURING THE DAY TO GET THE BEST SWELLING REDUCTION. HERE ARE SOME ADDITIONAL TIPS: Do not sleep in your garment. If you have pain or notice swelling in your hand or at the top of your shoulder, call your therapist. This may be a sign that you need a different garment. 3.  Take good care of your garment so it lasts longer: Follow washing instructions on your garment label or box. Wash periodically using a mild detergent in warm water.  Do not use fabric softener or bleach.  Place garment in a mesh lingerie bag and use the gentle cycle of the washing machine or hand wash. Tumble dry low or lay flat to dry. TAKE CARE OF YOUR SKIN Apply a low pH moisturizing lotion to your skin daily Avoid scratching your skin Treat skin irritations quickly  Know the 5 warning signs of infection: redness, pain, warmth to touch, fever and increased swelling.  Call your physician immediately if you notice any of these signs of a possible infection.  

## 2022-07-22 NOTE — Therapy (Signed)
OUTPATIENT PHYSICAL THERAPY SOZO SCREENING NOTE   Patient Name: Tracy Dyer MRN: [id number] DOB:[birth year], 51 y.o., female Today's Date: 07/22/2022  PCP: [name], NP REFERRING PROVIDER: [name], MD   PT End of Session - 07/22/22 1701     Visit Number 5   screen only   PT Start Time 1200    PT Stop Time 1214    PT Time Calculation (min) 14 min    Activity Tolerance Patient tolerated treatment well    Behavior During Therapy WFL for tasks assessed/performed             Past Medical History:  Diagnosis Date   Anxiety    Cancer ([place])    breast   Depression    Family history of breast cancer    Family history of lung cancer    Family history of ovarian cancer    GERD (gastroesophageal reflux disease)    diet controlled   History of radiation therapy    Left breast, left axilla- 05/27/21-07/10/21- Dr. [name]   Seasonal allergies    Past Surgical History:  Procedure Laterality Date   BREAST BIOPSY Left 2019   x 2 biopsy   BREAST EXCISIONAL BIOPSY Left    BREAST LUMPECTOMY WITH RADIOACTIVE SEED AND SENTINEL LYMPH NODE BIOPSY Left 02/26/2021   Procedure: LEFT BREAST LUMPECTOMY WITH RADIOACTIVE SEED x2; LEFT AXILLARY SENTINEL NODE BIOPSY;  Surgeon: [name], MD;  Location: [place];  Service: General;  Laterality: Left;   BREAST LUMPECTOMY WITH RADIOFREQUENCY TAG IDENTIFICATION Left 01/21/3085   Procedure: LEFT BREAST MRI WIRE GUIDED EXCISION;  Surgeon: [name], MD;  Location: [place];  Service: General;  Laterality: Left;   BREAST REDUCTION SURGERY Bilateral 04/18/2021   Procedure: MAMMARY REDUCTION  (BREAST) LEFT ONCOPLASTIC BREAST RECONSTRUCTION, RIGHT BREAST REDUCTION;  Surgeon: Irene Limbo, MD;  Location: [place];  Service: Plastics;  Laterality: Bilateral;   CHOLECYSTECTOMY  2008   PORT-A-CATH REMOVAL Right 04/18/2021   Procedure: REMOVAL PORT-A-CATH;  Surgeon:  [name], MD;  Location: [place];  Service: General;  Laterality: Right;   PORTACATH PLACEMENT N/A 09/05/2020   Procedure: INSERTION PORT-A-CATH;  Surgeon: [name], MD;  Location: [place];  Service: General;  Laterality: N/A;  [place] EXTRACTION     Patient Active Problem List   Diagnosis Date Noted   Occipital neuralgia of right side 07/01/2021   Peripheral vertigo 07/01/2021   Genetic testing 09/04/2020   Family history of breast cancer    Family history of ovarian cancer    Family history of lung cancer    Malignant neoplasm of upper-outer quadrant of left breast in female, estrogen receptor positive ([place]) 08/21/2020   Anxiety 11/16/2013   Hypercoagulable state ([place]) 11/16/2013   TIA (transient ischemic attack) 11/15/2013   Chest discomfort 01/20/2012   Factor V Leiden ([place]) 01/20/2012   MVP (mitral valve prolapse) 01/20/2012   Acute bronchitis 12/23/2011    REFERRING DIAG: left breast cancer at risk for lymphedema  THERAPY DIAG:  Localized edema  Aftercare following surgery for neoplasm  Abnormal posture  Malignant neoplasm of upper-outer quadrant of left breast in female, estrogen receptor positive ([place])  PERTINENT HISTORY: Lt lumpectomy with SLNB 0/4 LN on 02/26/21, chemotherapy and radiation completed ; ER/PR+, HER2-   PRECAUTIONS: left UE Lymphedema risk  SUBJECTIVE: I think I have that cording  PAIN:  Are you having pain? Yes the arm is getting sore  to touch  Patient was assessed today using the SOZO machine to determine the lymphedema index score. This was compared to her baseline score. It was determined that she is NOT within the recommended range when compared to her baseline and so she was fitted for a compression garment while in the clinic today. It is recommended she return in 1 month to be reassessed. If she continues to measure outside the recommended range, physical therapy treatment will be recommended  at that time and a referral requested. Referral was also requested from Dr. Lindi Adie to restart treatment and pt scheduled visits.     Stark Bray, PT 07/22/2022, 5:05 PM

## 2022-08-04 DIAGNOSIS — M9901 Segmental and somatic dysfunction of cervical region: Secondary | ICD-10-CM | POA: Diagnosis not present

## 2022-08-04 DIAGNOSIS — M9907 Segmental and somatic dysfunction of upper extremity: Secondary | ICD-10-CM | POA: Diagnosis not present

## 2022-08-04 DIAGNOSIS — M9902 Segmental and somatic dysfunction of thoracic region: Secondary | ICD-10-CM | POA: Diagnosis not present

## 2022-08-04 DIAGNOSIS — M7541 Impingement syndrome of right shoulder: Secondary | ICD-10-CM | POA: Diagnosis not present

## 2022-08-05 ENCOUNTER — Ambulatory Visit: Payer: BC Managed Care – PPO | Attending: Hematology and Oncology | Admitting: Rehabilitation

## 2022-08-05 ENCOUNTER — Encounter: Payer: Self-pay | Admitting: Rehabilitation

## 2022-08-05 DIAGNOSIS — R600 Localized edema: Secondary | ICD-10-CM | POA: Diagnosis not present

## 2022-08-05 DIAGNOSIS — Z483 Aftercare following surgery for neoplasm: Secondary | ICD-10-CM

## 2022-08-05 DIAGNOSIS — C50412 Malignant neoplasm of upper-outer quadrant of left female breast: Secondary | ICD-10-CM | POA: Diagnosis not present

## 2022-08-05 DIAGNOSIS — I890 Lymphedema, not elsewhere classified: Secondary | ICD-10-CM | POA: Diagnosis not present

## 2022-08-05 DIAGNOSIS — R293 Abnormal posture: Secondary | ICD-10-CM | POA: Diagnosis not present

## 2022-08-05 DIAGNOSIS — Z170 Estrogen receptor positive status [ER+]: Secondary | ICD-10-CM | POA: Diagnosis not present

## 2022-08-05 NOTE — Therapy (Signed)
OUTPATIENT PHYSICAL THERAPY ONCOLOGY EVALUATION  Patient Name: Tracy Dyer MRN: [id number] DOB:[birth year], 52 y.o., female Today's Date: 08/05/2022   PT End of Session - 08/05/22 0903     Visit Number 6    Number of Visits 14    Date for PT Re-Evaluation 09/02/22    PT Start Time 0812    PT Stop Time 0900    PT Time Calculation (min) 48 min    Activity Tolerance Patient tolerated treatment well    Behavior During Therapy [facility] for tasks assessed/performed              Past Medical History:  Diagnosis Date   Anxiety    Cancer ([place])    breast   Depression    Family history of breast cancer    Family history of lung cancer    Family history of ovarian cancer    GERD (gastroesophageal reflux disease)    diet controlled   History of radiation therapy    Left breast, left axilla- 05/27/21-07/10/21- Dr. [name]   Seasonal allergies    Past Surgical History:  Procedure Laterality Date   BREAST BIOPSY Left 2019   x 2 biopsy   BREAST EXCISIONAL BIOPSY Left    BREAST LUMPECTOMY WITH RADIOACTIVE SEED AND SENTINEL LYMPH NODE BIOPSY Left 02/26/2021   Procedure: LEFT BREAST LUMPECTOMY WITH RADIOACTIVE SEED x2; LEFT AXILLARY SENTINEL NODE BIOPSY;  Surgeon: [name], MD;  Location: [place];  Service: General;  Laterality: Left;   BREAST LUMPECTOMY WITH RADIOFREQUENCY TAG IDENTIFICATION Left 0/37/0964   Procedure: LEFT BREAST MRI WIRE GUIDED EXCISION;  Surgeon: [name], MD;  Location: [place];  Service: General;  Laterality: Left;   BREAST REDUCTION SURGERY Bilateral 04/18/2021   Procedure: MAMMARY REDUCTION  (BREAST) LEFT ONCOPLASTIC BREAST RECONSTRUCTION, RIGHT BREAST REDUCTION;  Surgeon: Irene Limbo, MD;  Location: [place];  Service: Plastics;  Laterality: Bilateral;   CHOLECYSTECTOMY  2008   PORT-A-CATH REMOVAL Right 04/18/2021   Procedure: REMOVAL PORT-A-CATH;  Surgeon: [name], MD;   Location: [place];  Service: General;  Laterality: Right;   PORTACATH PLACEMENT N/A 09/05/2020   Procedure: INSERTION PORT-A-CATH;  Surgeon: [name], MD;  Location: [facility] OR;  Service: General;  Laterality: N/A;  [place] EXTRACTION     Patient Active Problem List   Diagnosis Date Noted   Occipital neuralgia of right side 07/01/2021   Peripheral vertigo 07/01/2021   Genetic testing 09/04/2020   Family history of breast cancer    Family history of ovarian cancer    Family history of lung cancer    Malignant neoplasm of upper-outer quadrant of left breast in female, estrogen receptor positive ([place]) 08/21/2020   Anxiety 11/16/2013   Hypercoagulable state ([place]) 11/16/2013   TIA (transient ischemic attack) 11/15/2013   Chest discomfort 01/20/2012   Factor V Leiden ([place]) 01/20/2012   MVP (mitral valve prolapse) 01/20/2012   Acute bronchitis 12/23/2011    PCP: [name], MD  REFERRING PROVIDER: [name], MD   REFERRING DIAG: [phone] (ICD-10-CM) - Malignant neoplasm of upper-outer quadrant of left breast in female, estrogen receptor positive ([place])  THERAPY DIAG:  Aftercare following surgery for neoplasm  Abnormal posture  Malignant neoplasm of upper-outer quadrant of left breast in female, estrogen receptor positive ([place])  Lymphedema, not elsewhere classified  ONSET DATE: 03/03/22  Rationale for Evaluation and Treatment Rehabilitation  SUBJECTIVE  SUBJECTIVE STATEMENT: I have this tight thing in my arm.  I am been wearing the sleeve every day.  The back of the hand doesn't swell.  Its the worst in the morning.  If its a bad day it will be painful.  It is from the elbow to the wrist.    PERTINENT HISTORY:   Lt lumpectomy with SLNB 0/4 LN on  02/26/21, chemotherapy and radiation completed ; ER/PR+, HER2-.  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   PAIN:  Are you having pain? No NPRS scale: up to a 4/10  Pain location: left forearm  Pain orientation: Left  PAIN TYPE: discomfort Pain description: intermittent  Aggravating factors: trying to carry something, something tight on arm Relieving factors: nothing  PRECAUTIONS: Other: LUE lymphedema risk  WEIGHT BEARING RESTRICTIONS No  FALLS:  Has patient fallen in last 6 months? No  LIVING ENVIRONMENT: Lives with: lives with their family Lives in: House/apartment Has following equipment at home: None  OCCUPATION: full time, designer  LEISURE: pt is walking 30 min/5 days per week, swimming  HAND DOMINANCE : right   PRIOR LEVEL OF FUNCTION: Independent  PATIENT GOALS  : decrease pain    OBJECTIVE  COGNITION:  Overall cognitive status: Within functional limits for tasks assessed   PALPATION: Cording from axilla to wrist by the thumb  OBSERVATIONS / OTHER ASSESSMENTS: no visible swelling  POSTURE: forward head and rounded shoulders  UPPER EXTREMITY AROM/PROM:  A/PROM RIGHT   eval    Shoulder extension 68   Shoulder flexion 160   Shoulder abduction 160   Shoulder internal rotation    Shoulder external rotation      (Blank rows = not tested)  A/PROM LEFT   eval 08/05/22  Shoulder extension 70   Shoulder flexion 166 143 - pull wrist to axilla   Shoulder abduction 173 170  Shoulder internal rotation    Shoulder external rotation      (Blank rows = not tested)  Pain with dequervains stretch position  50# grip strength bilaterally  Strength 4+/5   LYMPHEDEMA ASSESSMENTS:  SURGERY TYPE/DATE: Left lumpectomy and SLNB 02/26/21 NUMBER OF LYMPH NODES REMOVED: 0/4 CHEMOTHERAPY: completed RADIATION: completed HORMONE TREATMENT: had to stop due to side effects INFECTIONS: none reported   LYMPHEDEMA ASSESSMENTS:   LANDMARK RIGHT  eval  10 cm proximal to  olecranon process 33.2  Olecranon process 26.8  10 cm proximal to ulnar styloid process 24  Just proximal to ulnar styloid process 16.5  Across hand at thumb web space 19.9  At base of 2nd digit 6.4  (Blank rows = not tested)  LANDMARK LEFT  eval  10 cm proximal to olecranon process 34.5  Olecranon process 26.7  10 cm proximal to ulnar styloid process 24.1  Just proximal to ulnar styloid process 16.3  Across hand at thumb web space 20  At base of 2nd digit 7  (Blank rows = not tested)   TODAY'S TREATMENT  08/05/22 Eval of cording status STM/MFR following cording from axilla to wrist with PROM and pinning included Review of self care: stretches, NSAIDs if able, use of compression  PATIENT EDUCATION:  Education details: Per today Person educated: Patient Education method: Explanation Education comprehension: verbalized understanding   HOME EXERCISE PROGRAM: Wear compression sleeve - stretches for cording  ASSESSMENT: CLINICAL IMPRESSION: Pt returns with continued Lt UE cording from axilla to wrist with pain and discomfort by the end of the day.  Pt has had cording in  the the past, elevated SOZO, and edema.  Pt will benefit from more visits at this time to decrease cording pain, decrease UE inflammation and lymphedema risk.   OBJECTIVE IMPAIRMENTS decreased knowledge of use of DME, increased edema, increased fascial restrictions, postural dysfunction, and pain.   ACTIVITY LIMITATIONS  none  PARTICIPATION LIMITATIONS:  none  PERSONAL FACTORS  none  are also affecting patient's functional outcome.   REHAB POTENTIAL: Excellent  CLINICAL DECISION MAKING: Stable/uncomplicated  EVALUATION COMPLEXITY: Low  GOALS: Goals reviewed with patient? Yes  SHORT TERM GOALS=LONG TERM GOALS  Target date: 09/02/2022    Pt will return Lt UE AROM to prior levels to improve mobility  Baseline: Goal status: INITIAL  2.  Pt will report no limitations in ADLs from cording  discomfort Baseline:  Goal status: INITIAL  PLAN: PT FREQUENCY: 2x/week  PT DURATION: 4 weeks  PLANNED INTERVENTIONS: Therapeutic exercises, Therapeutic activity, Patient/Family education, Self Care, Joint mobilization, Orthotic/Fit training, Manual lymph drainage, Compression bandaging, scar mobilization, Taping, and Manual therapy  PLAN FOR NEXT SESSION: MFR to cording in L UE, MLD, redo SOZO around 08/19/22 and continue schedule.    Stark Bray, PT 08/05/2022, 9:05 AM

## 2022-08-10 ENCOUNTER — Encounter: Payer: Self-pay | Admitting: Rehabilitation

## 2022-08-10 ENCOUNTER — Ambulatory Visit: Payer: BC Managed Care – PPO | Admitting: Rehabilitation

## 2022-08-10 DIAGNOSIS — R600 Localized edema: Secondary | ICD-10-CM | POA: Diagnosis not present

## 2022-08-10 DIAGNOSIS — R293 Abnormal posture: Secondary | ICD-10-CM | POA: Diagnosis not present

## 2022-08-10 DIAGNOSIS — Z170 Estrogen receptor positive status [ER+]: Secondary | ICD-10-CM

## 2022-08-10 DIAGNOSIS — Z483 Aftercare following surgery for neoplasm: Secondary | ICD-10-CM | POA: Diagnosis not present

## 2022-08-10 DIAGNOSIS — C50412 Malignant neoplasm of upper-outer quadrant of left female breast: Secondary | ICD-10-CM | POA: Diagnosis not present

## 2022-08-10 DIAGNOSIS — I890 Lymphedema, not elsewhere classified: Secondary | ICD-10-CM

## 2022-08-10 NOTE — Therapy (Signed)
OUTPATIENT PHYSICAL THERAPY ONCOLOGY TREATMENT  Patient Name: Tracy Dyer MRN: [id number] DOB:1971-03-28, 52 y.o., female Today's Date: 08/11/2022   PT End of Session - 08/11/22 0856     Visit Number 7    Number of Visits 14    Date for PT Re-Evaluation 09/02/22    PT Start Time 1215    PT Stop Time 1300    PT Time Calculation (min) 45 min    Activity Tolerance Patient tolerated treatment well    Behavior During Therapy WFL for tasks assessed/performed               Past Medical History:  Diagnosis Date   Anxiety    Cancer ([place])    breast   Depression    Family history of breast cancer    Family history of lung cancer    Family history of ovarian cancer    GERD (gastroesophageal reflux disease)    diet controlled   History of radiation therapy    Left breast, left axilla- 05/27/21-07/10/21- Dr. [name]   Seasonal allergies    Past Surgical History:  Procedure Laterality Date   BREAST BIOPSY Left 2019   x 2 biopsy   BREAST EXCISIONAL BIOPSY Left    BREAST LUMPECTOMY WITH RADIOACTIVE SEED AND SENTINEL LYMPH NODE BIOPSY Left 02/26/2021   Procedure: LEFT BREAST LUMPECTOMY WITH RADIOACTIVE SEED x2; LEFT AXILLARY SENTINEL NODE BIOPSY;  Surgeon: [name], MD;  Location: [place];  Service: General;  Laterality: Left;   BREAST LUMPECTOMY WITH RADIOFREQUENCY TAG IDENTIFICATION Left 9/51/8841   Procedure: LEFT BREAST MRI WIRE GUIDED EXCISION;  Surgeon: [name], MD;  Location: [place];  Service: General;  Laterality: Left;   BREAST REDUCTION SURGERY Bilateral 04/18/2021   Procedure: MAMMARY REDUCTION  (BREAST) LEFT ONCOPLASTIC BREAST RECONSTRUCTION, RIGHT BREAST REDUCTION;  Surgeon: Irene Limbo, MD;  Location: [place];  Service: Plastics;  Laterality: Bilateral;   CHOLECYSTECTOMY  2008   PORT-A-CATH REMOVAL Right 04/18/2021   Procedure: REMOVAL PORT-A-CATH;  Surgeon: [name], MD;   Location: [place];  Service: General;  Laterality: Right;   PORTACATH PLACEMENT N/A 09/05/2020   Procedure: INSERTION PORT-A-CATH;  Surgeon: [name], MD;  Location: [facility] OR;  Service: General;  Laterality: N/A;  [place] EXTRACTION     Patient Active Problem List   Diagnosis Date Noted   Occipital neuralgia of right side 07/01/2021   Peripheral vertigo 07/01/2021   Genetic testing 09/04/2020   Family history of breast cancer    Family history of ovarian cancer    Family history of lung cancer    Malignant neoplasm of upper-outer quadrant of left breast in female, estrogen receptor positive ([place]) 08/21/2020   Anxiety 11/16/2013   Hypercoagulable state ([place]) 11/16/2013   TIA (transient ischemic attack) 11/15/2013   Chest discomfort 01/20/2012   Factor V Leiden ([place]) 01/20/2012   MVP (mitral valve prolapse) 01/20/2012   Acute bronchitis 12/23/2011    PCP: [name], MD  REFERRING PROVIDER: [name], MD   REFERRING DIAG: [phone] (ICD-10-CM) - Malignant neoplasm of upper-outer quadrant of left breast in female, estrogen receptor positive ([place])  THERAPY DIAG:  Aftercare following surgery for neoplasm  Malignant neoplasm of upper-outer quadrant of left breast in female, estrogen receptor positive ([place])  Lymphedema, not elsewhere classified  Abnormal posture  Localized edema  ONSET DATE: 03/03/22  Rationale for Evaluation and Treatment Rehabilitation  SUBJECTIVE  SUBJECTIVE STATEMENT:  I wore my sleeve and it seems to help.  I also heard a pop when I was stretching  PERTINENT HISTORY:   Lt lumpectomy with SLNB 0/4 LN on 02/26/21, chemotherapy and radiation completed ; ER/PR+, HER2-.  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   PAIN:  Are you  having pain? No NPRS scale: up to a 4/10  Pain location: left forearm  Pain orientation: Left  PAIN TYPE: discomfort Pain description: intermittent  Aggravating factors: trying to carry something, something tight on arm Relieving factors: nothing  PRECAUTIONS: Other: LUE lymphedema risk  WEIGHT BEARING RESTRICTIONS No  FALLS:  Has patient fallen in last 6 months? No  LIVING ENVIRONMENT: Lives with: lives with their family Lives in: House/apartment Has following equipment at home: None  OCCUPATION: full time, designer  LEISURE: pt is walking 30 min/5 days per week, swimming  HAND DOMINANCE : right   PRIOR LEVEL OF FUNCTION: Independent  PATIENT GOALS  : decrease pain    OBJECTIVE  COGNITION:  Overall cognitive status: Within functional limits for tasks assessed   PALPATION: Cording from axilla to wrist by the thumb  OBSERVATIONS / OTHER ASSESSMENTS: no visible swelling  POSTURE: forward head and rounded shoulders  UPPER EXTREMITY AROM/PROM:  A/PROM RIGHT   eval    Shoulder extension 68   Shoulder flexion 160   Shoulder abduction 160   Shoulder internal rotation    Shoulder external rotation      (Blank rows = not tested)  A/PROM LEFT   eval 08/05/22  Shoulder extension 70   Shoulder flexion 166 143 - pull wrist to axilla   Shoulder abduction 173 170  Shoulder internal rotation    Shoulder external rotation      (Blank rows = not tested)  Pain with dequervains stretch position  50# grip strength bilaterally  Strength 4+/5   LYMPHEDEMA ASSESSMENTS:  SURGERY TYPE/DATE: Left lumpectomy and SLNB 02/26/21 NUMBER OF LYMPH NODES REMOVED: 0/4 CHEMOTHERAPY: completed RADIATION: completed HORMONE TREATMENT: had to stop due to side effects INFECTIONS: none reported   LYMPHEDEMA ASSESSMENTS:   LANDMARK RIGHT  eval  10 cm proximal to olecranon process 33.2  Olecranon process 26.8  10 cm proximal to ulnar styloid process 24  Just proximal to ulnar  styloid process 16.5  Across hand at thumb web space 19.9  At base of 2nd digit 6.4  (Blank rows = not tested)  LANDMARK LEFT  eval  10 cm proximal to olecranon process 34.5  Olecranon process 26.7  10 cm proximal to ulnar styloid process 24.1  Just proximal to ulnar styloid process 16.3  Across hand at thumb web space 20  At base of 2nd digit 7  (Blank rows = not tested)   TODAY'S TREATMENT  Pt permission and consent throughout each step of examination and treatment with modification and draping if requested when working on sensitive areas  08/10/22 STM/MFR following cording from axilla to wrist with PROM and pinning included PROM to tolerance MLD following cording work for Rt UE  08/05/22 Eval of cording status STM/MFR following cording from axilla to wrist with PROM and pinning included Review of self care: stretches, NSAIDs if able, use of compression  PATIENT EDUCATION:  Education details: Per today Person educated: Patient Education method: Explanation Education comprehension: verbalized understanding   HOME EXERCISE PROGRAM: Wear compression sleeve - stretches for cording  ASSESSMENT: CLINICAL IMPRESSION: Pt returns with continued Lt UE cording from axilla to wrist.  Axilla  seems less tight today and pt has been returning to stretches more regularly and wearing compresison.    OBJECTIVE IMPAIRMENTS decreased knowledge of use of DME, increased edema, increased fascial restrictions, postural dysfunction, and pain.   ACTIVITY LIMITATIONS  none  PARTICIPATION LIMITATIONS:  none  PERSONAL FACTORS  none  are also affecting patient's functional outcome.   REHAB POTENTIAL: Excellent  CLINICAL DECISION MAKING: Stable/uncomplicated  EVALUATION COMPLEXITY: Low  GOALS: Goals reviewed with patient? Yes  SHORT TERM GOALS=LONG TERM GOALS  Target date: 09/02/2022    Pt will return Lt UE AROM to prior levels to improve mobility  Baseline: Goal status: INITIAL  2.   Pt will report no limitations in ADLs from cording discomfort Baseline:  Goal status: INITIAL  PLAN: PT FREQUENCY: 2x/week  PT DURATION: 4 weeks  PLANNED INTERVENTIONS: Therapeutic exercises, Therapeutic activity, Patient/Family education, Self Care, Joint mobilization, Orthotic/Fit training, Manual lymph drainage, Compression bandaging, scar mobilization, Taping, and Manual therapy  PLAN FOR NEXT SESSION: MFR to cording in L UE, MLD, redo SOZO around 08/19/22 and continue schedule.    Stark Bray, PT 08/11/2022, 8:57 AM

## 2022-08-12 ENCOUNTER — Encounter: Payer: Self-pay | Admitting: Hematology and Oncology

## 2022-08-12 ENCOUNTER — Ambulatory Visit: Payer: BC Managed Care – PPO

## 2022-08-12 LAB — SIGNATERA

## 2022-08-14 DIAGNOSIS — Z170 Estrogen receptor positive status [ER+]: Secondary | ICD-10-CM | POA: Diagnosis not present

## 2022-08-14 DIAGNOSIS — D6859 Other primary thrombophilia: Secondary | ICD-10-CM | POA: Diagnosis not present

## 2022-08-14 DIAGNOSIS — C50912 Malignant neoplasm of unspecified site of left female breast: Secondary | ICD-10-CM | POA: Diagnosis not present

## 2022-08-17 ENCOUNTER — Encounter: Payer: Self-pay | Admitting: Rehabilitation

## 2022-08-17 ENCOUNTER — Ambulatory Visit: Payer: BC Managed Care – PPO | Admitting: Rehabilitation

## 2022-08-17 DIAGNOSIS — Z170 Estrogen receptor positive status [ER+]: Secondary | ICD-10-CM

## 2022-08-17 DIAGNOSIS — R600 Localized edema: Secondary | ICD-10-CM | POA: Diagnosis not present

## 2022-08-17 DIAGNOSIS — R293 Abnormal posture: Secondary | ICD-10-CM

## 2022-08-17 DIAGNOSIS — I890 Lymphedema, not elsewhere classified: Secondary | ICD-10-CM

## 2022-08-17 DIAGNOSIS — C50412 Malignant neoplasm of upper-outer quadrant of left female breast: Secondary | ICD-10-CM | POA: Diagnosis not present

## 2022-08-17 DIAGNOSIS — Z483 Aftercare following surgery for neoplasm: Secondary | ICD-10-CM | POA: Diagnosis not present

## 2022-08-17 NOTE — Therapy (Signed)
OUTPATIENT PHYSICAL THERAPY ONCOLOGY TREATMENT  Patient Name: Tracy Dyer MRN: [id number] DOB:[birth year], 52 y.o., female Today's Date: 08/17/2022   PT End of Session - 08/17/22 1204     Visit Number 8    Number of Visits 14    PT Start Time 1205    PT Stop Time 6073    PT Time Calculation (min) 50 min    Activity Tolerance Patient tolerated treatment well    Behavior During Therapy WFL for tasks assessed/performed               Past Medical History:  Diagnosis Date   Anxiety    Cancer ([place])    breast   Depression    Family history of breast cancer    Family history of lung cancer    Family history of ovarian cancer    GERD (gastroesophageal reflux disease)    diet controlled   History of radiation therapy    Left breast, left axilla- 05/27/21-07/10/21- Dr. [name]   Seasonal allergies    Past Surgical History:  Procedure Laterality Date   BREAST BIOPSY Left 2019   x 2 biopsy   BREAST EXCISIONAL BIOPSY Left    BREAST LUMPECTOMY WITH RADIOACTIVE SEED AND SENTINEL LYMPH NODE BIOPSY Left 02/26/2021   Procedure: LEFT BREAST LUMPECTOMY WITH RADIOACTIVE SEED x2; LEFT AXILLARY SENTINEL NODE BIOPSY;  Surgeon: [name], MD;  Location: [place];  Service: General;  Laterality: Left;   BREAST LUMPECTOMY WITH RADIOFREQUENCY TAG IDENTIFICATION Left 02/10/6268   Procedure: LEFT BREAST MRI WIRE GUIDED EXCISION;  Surgeon: [name], MD;  Location: [place];  Service: General;  Laterality: Left;   BREAST REDUCTION SURGERY Bilateral 04/18/2021   Procedure: MAMMARY REDUCTION  (BREAST) LEFT ONCOPLASTIC BREAST RECONSTRUCTION, RIGHT BREAST REDUCTION;  Surgeon: Irene Limbo, MD;  Location: [place];  Service: Plastics;  Laterality: Bilateral;   CHOLECYSTECTOMY  2008   PORT-A-CATH REMOVAL Right 04/18/2021   Procedure: REMOVAL PORT-A-CATH;  Surgeon: [name], MD;  Location: [place];   Service: General;  Laterality: Right;   PORTACATH PLACEMENT N/A 09/05/2020   Procedure: INSERTION PORT-A-CATH;  Surgeon: [name], MD;  Location: [facility] OR;  Service: General;  Laterality: N/A;  [place] EXTRACTION     Patient Active Problem List   Diagnosis Date Noted   Occipital neuralgia of right side 07/01/2021   Peripheral vertigo 07/01/2021   Genetic testing 09/04/2020   Family history of breast cancer    Family history of ovarian cancer    Family history of lung cancer    Malignant neoplasm of upper-outer quadrant of left breast in female, estrogen receptor positive ([place]) 08/21/2020   Anxiety 11/16/2013   Hypercoagulable state ([place]) 11/16/2013   TIA (transient ischemic attack) 11/15/2013   Chest discomfort 01/20/2012   Factor V Leiden ([place]) 01/20/2012   MVP (mitral valve prolapse) 01/20/2012   Acute bronchitis 12/23/2011    PCP: [name], MD  REFERRING PROVIDER: [name], MD   REFERRING DIAG: [phone] (ICD-10-CM) - Malignant neoplasm of upper-outer quadrant of left breast in female, estrogen receptor positive ([place])  THERAPY DIAG:  Aftercare following surgery for neoplasm  Malignant neoplasm of upper-outer quadrant of left breast in female, estrogen receptor positive ([place])  Lymphedema, not elsewhere classified  Abnormal posture  Localized edema  ONSET DATE: 03/03/22  Rationale for Evaluation and Treatment Rehabilitation  SUBJECTIVE  SUBJECTIVE STATEMENT:  My cording is real pronounced.  I think the sleeve is bothering my wrist.    PERTINENT HISTORY:   Lt lumpectomy with SLNB 0/4 LN on 02/26/21, chemotherapy and radiation completed ; ER/PR+, HER2-.  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   PAIN:  Are you having pain? No NPRS scale: up to a 0/10   Pain location: left forearm  Pain orientation: Left  PAIN TYPE: discomfort Pain description: intermittent  Aggravating factors: trying to carry something, something tight on arm Relieving factors: nothing  PRECAUTIONS: Other: LUE lymphedema risk  WEIGHT BEARING RESTRICTIONS No  FALLS:  Has patient fallen in last 6 months? No  LIVING ENVIRONMENT: Lives with: lives with their family Lives in: House/apartment Has following equipment at home: None  OCCUPATION: full time, designer  LEISURE: pt is walking 30 min/5 days per week, swimming  HAND DOMINANCE : right   PRIOR LEVEL OF FUNCTION: Independent  PATIENT GOALS  : decrease pain    OBJECTIVE  COGNITION:  Overall cognitive status: Within functional limits for tasks assessed   PALPATION: Cording from axilla to wrist by the thumb  OBSERVATIONS / OTHER ASSESSMENTS: no visible swelling  POSTURE: forward head and rounded shoulders  UPPER EXTREMITY AROM/PROM:  A/PROM RIGHT   eval    Shoulder extension 68   Shoulder flexion 160   Shoulder abduction 160   Shoulder internal rotation    Shoulder external rotation      (Blank rows = not tested)  A/PROM LEFT   eval 08/05/22  Shoulder extension 70   Shoulder flexion 166 143 - pull wrist to axilla   Shoulder abduction 173 170  Shoulder internal rotation    Shoulder external rotation      (Blank rows = not tested)  Pain with dequervains stretch position  50# grip strength bilaterally  Strength 4+/5   LYMPHEDEMA ASSESSMENTS:  SURGERY TYPE/DATE: Left lumpectomy and SLNB 02/26/21 NUMBER OF LYMPH NODES REMOVED: 0/4 CHEMOTHERAPY: completed RADIATION: completed HORMONE TREATMENT: had to stop due to side effects INFECTIONS: none reported   LYMPHEDEMA ASSESSMENTS:   LANDMARK RIGHT  eval  10 cm proximal to olecranon process 33.2  Olecranon process 26.8  10 cm proximal to ulnar styloid process 24  Just proximal to ulnar styloid process 16.5  Across hand at thumb  web space 19.9  At base of 2nd digit 6.4  (Blank rows = not tested)  LANDMARK LEFT  eval  10 cm proximal to olecranon process 34.5  Olecranon process 26.7  10 cm proximal to ulnar styloid process 24.1  Just proximal to ulnar styloid process 16.3  Across hand at thumb web space 20  At base of 2nd digit 7  (Blank rows = not tested)   TODAY'S TREATMENT  Pt permission and consent throughout each step of examination and treatment with modification and draping if requested when working on sensitive areas  08/17/22 Pulleys into flexion and abduction x 56mn each Wall single arm chest stretch 2x20" STM/MFR following cording from axilla to wrist with PROM and pinning included PROM to tolerance MLD following cording work for Rt UE  08/10/22 STM/MFR following cording from axilla to wrist with PROM and pinning included PROM to tolerance MLD following cording work for Rt UE  08/05/22 Eval of cording status STM/MFR following cording from axilla to wrist with PROM and pinning included Review of self care: stretches, NSAIDs if able, use of compression  PATIENT EDUCATION:  Education details: Per today Person educated: Patient Education  method: Explanation Education comprehension: verbalized understanding   HOME EXERCISE PROGRAM: Wear compression sleeve - stretches for cording  ASSESSMENT: CLINICAL IMPRESSION: Pt returns with continued Lt UE cording from axilla to wrist.  Less prominent at antecubital fossa and more so medial upper arm.     OBJECTIVE IMPAIRMENTS decreased knowledge of use of DME, increased edema, increased fascial restrictions, postural dysfunction, and pain.   ACTIVITY LIMITATIONS  none  PARTICIPATION LIMITATIONS:  none  PERSONAL FACTORS  none  are also affecting patient's functional outcome.   REHAB POTENTIAL: Excellent  CLINICAL DECISION MAKING: Stable/uncomplicated  EVALUATION COMPLEXITY: Low  GOALS: Goals reviewed with patient? Yes  SHORT TERM  GOALS=LONG TERM GOALS  Target date: 09/02/2022    Pt will return Lt UE AROM to prior levels to improve mobility  Baseline: Goal status: INITIAL  2.  Pt will report no limitations in ADLs from cording discomfort Baseline:  Goal status: INITIAL  PLAN: PT FREQUENCY: 2x/week  PT DURATION: 4 weeks  PLANNED INTERVENTIONS: Therapeutic exercises, Therapeutic activity, Patient/Family education, Self Care, Joint mobilization, Orthotic/Fit training, Manual lymph drainage, Compression bandaging, scar mobilization, Taping, and Manual therapy  PLAN FOR NEXT SESSION: MFR to cording in L UE, MLD, redo SOZO around 08/19/22 and continue schedule.    Stark Bray, PT 08/17/2022, 12:55 PM

## 2022-08-18 ENCOUNTER — Other Ambulatory Visit: Payer: Self-pay | Admitting: Hematology and Oncology

## 2022-08-18 DIAGNOSIS — Z9889 Other specified postprocedural states: Secondary | ICD-10-CM

## 2022-08-19 ENCOUNTER — Other Ambulatory Visit (HOSPITAL_BASED_OUTPATIENT_CLINIC_OR_DEPARTMENT_OTHER): Payer: Self-pay

## 2022-08-19 ENCOUNTER — Ambulatory Visit: Payer: BC Managed Care – PPO

## 2022-08-19 VITALS — Wt 204.4 lb

## 2022-08-19 DIAGNOSIS — R600 Localized edema: Secondary | ICD-10-CM

## 2022-08-19 DIAGNOSIS — R293 Abnormal posture: Secondary | ICD-10-CM | POA: Diagnosis not present

## 2022-08-19 DIAGNOSIS — Z483 Aftercare following surgery for neoplasm: Secondary | ICD-10-CM

## 2022-08-19 DIAGNOSIS — I890 Lymphedema, not elsewhere classified: Secondary | ICD-10-CM

## 2022-08-19 DIAGNOSIS — C50412 Malignant neoplasm of upper-outer quadrant of left female breast: Secondary | ICD-10-CM | POA: Diagnosis not present

## 2022-08-19 DIAGNOSIS — Z170 Estrogen receptor positive status [ER+]: Secondary | ICD-10-CM | POA: Diagnosis not present

## 2022-08-19 MED ORDER — ZEPBOUND 5 MG/0.5ML ~~LOC~~ SOAJ
5.0000 mg | SUBCUTANEOUS | 0 refills | Status: DC
  Filled 2022-08-19: qty 2, 28d supply, fill #0

## 2022-08-19 NOTE — Therapy (Signed)
OUTPATIENT PHYSICAL THERAPY ONCOLOGY TREATMENT  Patient Name: Tracy Dyer MRN: [id number] DOB:Dec 21, 1970, 52 y.o., female Today's Date: 08/19/2022   PT End of Session - 08/19/22 0809     Visit Number 9    Number of Visits 14    Date for PT Re-Evaluation 09/02/22    PT Start Time 0806   pt arrived late   PT Stop Time 0908    PT Time Calculation (min) 62 min    Activity Tolerance Patient tolerated treatment well    Behavior During Therapy [facility] for tasks assessed/performed               Past Medical History:  Diagnosis Date   Anxiety    Cancer ([place])    breast   Depression    Family history of breast cancer    Family history of lung cancer    Family history of ovarian cancer    GERD (gastroesophageal reflux disease)    diet controlled   History of radiation therapy    Left breast, left axilla- 05/27/21-07/10/21- Dr. [name]   Seasonal allergies    Past Surgical History:  Procedure Laterality Date   BREAST BIOPSY Left 2019   x 2 biopsy   BREAST EXCISIONAL BIOPSY Left    BREAST LUMPECTOMY WITH RADIOACTIVE SEED AND SENTINEL LYMPH NODE BIOPSY Left 02/26/2021   Procedure: LEFT BREAST LUMPECTOMY WITH RADIOACTIVE SEED x2; LEFT AXILLARY SENTINEL NODE BIOPSY;  Surgeon: [name], MD;  Location: [place];  Service: General;  Laterality: Left;   BREAST LUMPECTOMY WITH RADIOFREQUENCY TAG IDENTIFICATION Left 9/76/7341   Procedure: LEFT BREAST MRI WIRE GUIDED EXCISION;  Surgeon: [name], MD;  Location: [place];  Service: General;  Laterality: Left;   BREAST REDUCTION SURGERY Bilateral 04/18/2021   Procedure: MAMMARY REDUCTION  (BREAST) LEFT ONCOPLASTIC BREAST RECONSTRUCTION, RIGHT BREAST REDUCTION;  Surgeon: Irene Limbo, MD;  Location: [place];  Service: Plastics;  Laterality: Bilateral;   CHOLECYSTECTOMY  2008   PORT-A-CATH REMOVAL Right 04/18/2021   Procedure: REMOVAL PORT-A-CATH;  Surgeon:  [name], MD;  Location: [place];  Service: General;  Laterality: Right;   PORTACATH PLACEMENT N/A 09/05/2020   Procedure: INSERTION PORT-A-CATH;  Surgeon: [name], MD;  Location: [facility];  Service: General;  Laterality: N/A;  Two Strike EXTRACTION     Patient Active Problem List   Diagnosis Date Noted   Occipital neuralgia of right side 07/01/2021   Peripheral vertigo 07/01/2021   Genetic testing 09/04/2020   Family history of breast cancer    Family history of ovarian cancer    Family history of lung cancer    Malignant neoplasm of upper-outer quadrant of left breast in female, estrogen receptor positive ([place]) 08/21/2020   Anxiety 11/16/2013   Hypercoagulable state ([place]) 11/16/2013   TIA (transient ischemic attack) 11/15/2013   Chest discomfort 01/20/2012   Factor V Leiden ([place]) 01/20/2012   MVP (mitral valve prolapse) 01/20/2012   Acute bronchitis 12/23/2011    PCP: [name], MD  REFERRING PROVIDER: [name], MD   REFERRING DIAG: [phone] (ICD-10-CM) - Malignant neoplasm of upper-outer quadrant of left breast in female, estrogen receptor positive ([place])  THERAPY DIAG:  Aftercare following surgery for neoplasm  Malignant neoplasm of upper-outer quadrant of left breast in female, estrogen receptor positive ([place])  Lymphedema, not elsewhere classified  Abnormal posture  Localized edema  ONSET DATE: 03/03/22  Rationale for Evaluation and Treatment Rehabilitation  SUBJECTIVE  SUBJECTIVE STATEMENT: The cording overall has improved but it is really still bothering me.   PERTINENT HISTORY:   Lt lumpectomy with SLNB 0/4 LN on 02/26/21, chemotherapy and radiation completed ; ER/PR+, HER2-.  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   PAIN:   Are you having pain? No NPRS scale: up to a 0/10  Pain location: left forearm  Pain orientation: Left  PAIN TYPE: discomfort Pain description: intermittent  Aggravating factors: trying to carry something, something tight on arm Relieving factors: nothing  PRECAUTIONS: Other: LUE lymphedema risk  WEIGHT BEARING RESTRICTIONS No  FALLS:  Has patient fallen in last 6 months? No  LIVING ENVIRONMENT: Lives with: lives with their family Lives in: House/apartment Has following equipment at home: None  OCCUPATION: full time, designer  LEISURE: pt is walking 30 min/5 days per week, swimming  HAND DOMINANCE : right   PRIOR LEVEL OF FUNCTION: Independent  PATIENT GOALS  : decrease pain    OBJECTIVE  COGNITION:  Overall cognitive status: Within functional limits for tasks assessed   PALPATION: Cording from axilla to wrist by the thumb  OBSERVATIONS / OTHER ASSESSMENTS: no visible swelling  POSTURE: forward head and rounded shoulders  UPPER EXTREMITY AROM/PROM:  A/PROM RIGHT   eval    Shoulder extension 68   Shoulder flexion 160   Shoulder abduction 160   Shoulder internal rotation    Shoulder external rotation      (Blank rows = not tested)  A/PROM LEFT   eval 08/05/22  Shoulder extension 70   Shoulder flexion 166 143 - pull wrist to axilla   Shoulder abduction 173 170  Shoulder internal rotation    Shoulder external rotation      (Blank rows = not tested)  Pain with dequervains stretch position  50# grip strength bilaterally  Strength 4+/5   LYMPHEDEMA ASSESSMENTS:  SURGERY TYPE/DATE: Left lumpectomy and SLNB 02/26/21 NUMBER OF LYMPH NODES REMOVED: 0/4 CHEMOTHERAPY: completed RADIATION: completed HORMONE TREATMENT: had to stop due to side effects INFECTIONS: none reported   LYMPHEDEMA ASSESSMENTS:   LANDMARK RIGHT  eval  10 cm proximal to olecranon process 33.2  Olecranon process 26.8  10 cm proximal to ulnar styloid process 24  Just proximal  to ulnar styloid process 16.5  Across hand at thumb web space 19.9  At base of 2nd digit 6.4  (Blank rows = not tested)  LANDMARK LEFT  eval  10 cm proximal to olecranon process 34.5  Olecranon process 26.7  10 cm proximal to ulnar styloid process 24.1  Just proximal to ulnar styloid process 16.3  Across hand at thumb web space 20  At base of 2nd digit 7  (Blank rows = not tested)   L-DEX FLOWSHEETS - 08/19/22 0800       L-DEX LYMPHEDEMA SCREENING   Measurement Type Unilateral    L-DEX MEASUREMENT EXTREMITY Upper Extremity    POSITION  Standing    DOMINANT SIDE Right    At Risk Side Left    BASELINE SCORE (UNILATERAL) -0.3    L-DEX SCORE (UNILATERAL) 7.7    VALUE CHANGE (UNILAT) 8              TODAY'S TREATMENT  Pt permission and consent throughout each step of examination and treatment with modification and draping if requested when working on sensitive areas  08/19/22: Redid SOZO and her change from baseline os slightly more elevated than last time with a score of 8. Pt reports she wasn't 100% compliant  with wear of her compression garments so will work on this for next 30 days.  Therapeutic Exercises Pulleys into flexion and abduction x2 mins each with VC's to remind pt to relax shoulders Modified downward dog on wall 5x, 5 sec holds returning therapist demo  Manual Therapy MFR in supine to cording MLD: Performed full sequence and began instructing pt while performing due to her change from baseline with SOZO screen still being elevated, more so than 1 month ago: Short neck, 5 diaphragmatic breaths, Lt inguinal and Rt axillary nodes, Lt axillo-inguinal and anterior inter-axillary anastomosis, then focused on Lt UE working from distal to proximal then reacting all steps per handout (see below) that was issued as well. Had pt return some of steps to assess her pressure and skin stretch vs slide. Handover hand technique used for this.   08/17/22 Pulleys into flexion and  abduction x 69mn each Wall single arm chest stretch 2x20" STM/MFR following cording from axilla to wrist with PROM and pinning included PROM to tolerance MLD following cording work for Rt UE  08/10/22 STM/MFR following cording from axilla to wrist with PROM and pinning included PROM to tolerance MLD following cording work for RRyder SystemUE    PATIENT EDUCATION:  Education details: Self MLD Person educated: Patient Education method: EConsulting civil engineer DMedia planner handout issued Education comprehension: verbalized understanding, returned demonstration and will benefit from further review   HOME EXERCISE PROGRAM: Wear compression sleeve - stretches for cording and add daily self MLD  ASSESSMENT: CLINICAL IMPRESSION: Pts cording was mildly palpable today but still feels very limiting and painful to pt from axilla to wrist. Redid her SOZO screen today and this was still elevated, even more so from 1 month ago increasing from a change from baseline from 7.1 to 8 today. So continued with MFR but included instruction of MLD while performing this today. Also instructed pt in basics of anatomy of lymphatic system and explained importance of daily, 10 hrs/day, wear of compression sleeve with gauntlet and how this will help to reverse her current subclinical lymphedema, along with adding daily self MLD. Pt was able to verbalize better understanding of her lymphatic system and self MLD sequence. Handout was issued and pt is to practice with intent to be ready to review technique at next session .  OBJECTIVE IMPAIRMENTS decreased knowledge of use of DME, increased edema, increased fascial restrictions, postural dysfunction, and pain.   ACTIVITY LIMITATIONS  none  PARTICIPATION LIMITATIONS:  none  PERSONAL FACTORS  none  are also affecting patient's functional outcome.   REHAB POTENTIAL: Excellent  CLINICAL DECISION MAKING: Stable/uncomplicated  EVALUATION COMPLEXITY: Low  GOALS: Goals reviewed with  patient? Yes  SHORT TERM GOALS=LONG TERM GOALS  Target date: 09/02/2022    Pt will return Lt UE AROM to prior levels to improve mobility  Baseline: Goal status: INITIAL  2.  Pt will report no limitations in ADLs from cording discomfort Baseline:  Goal status: INITIAL  PLAN: PT FREQUENCY: 2x/week  PT DURATION: 4 weeks  PLANNED INTERVENTIONS: Therapeutic exercises, Therapeutic activity, Patient/Family education, Self Care, Joint mobilization, Orthotic/Fit training, Manual lymph drainage, Compression bandaging, scar mobilization, Taping, and Manual therapy  PLAN FOR NEXT SESSION: MFR to cording in L UE, MLD to Lt UE-cont this and review her technique   ROtelia Limes PTA 08/19/2022, 11:49 AM    Start with circles near neck above collarbones, 5 times each side.    Cancer Rehab [phone] Deep Effective Breath   Standing, sitting, or laying down,  place both hands on the belly. Take a deep breath IN, expanding the belly; then breath OUT, contracting the belly. Repeat __5__ times. Do __2-3__ sessions per day and before your self massage.  Axilla to Axilla - Sweep   On uninvolved side make 5 circles in the armpit, then pump _5__ times from involved armpit across chest to uninvolved armpit, making a pathway. Do _1__ time per day.  Copyright  VHI. All rights reserved.  Axilla to Inguinal Nodes - Sweep   On involved side, make 5 circles at groin at panty line, then pump _5__ times from armpit along side of trunk to outer hip, making your other pathway. Do __1_ time per day.  Copyright  VHI. All rights reserved.  Arm Posterior: Elbow to Shoulder - Sweep   Pump _5__ times from back of elbow to top of shoulder. Then inner to outer upper arm _5_ times, then outer arm again _5_ times. Then back to the pathways _2-3_ times. Do _1__ time per day.  Copyright  VHI. All rights reserved.  ARM: Volar Wrist to Elbow - Sweep   Pump or stationary circles _5__ times from  wrist to elbow making sure to do both sides of the forearm. Then retrace your steps to the outer arm, and the pathways _2-3_ times each. Do _1__ time per day.  Copyright  VHI. All rights reserved.  ARM: Dorsum of Hand to Shoulder - Sweep   Pump or stationary circles _5__ times on back of hand including knuckle spaces and individual fingers if needed working up towards the wrist, then retrace all your steps working back up the forearm, doing both sides; upper outer arm and back to your pathways _2-3_ times each. Then do 5 circles again at uninvolved armpit and involved groin where you started! Good job!! Do __1_ time per day.

## 2022-08-19 NOTE — Patient Instructions (Signed)
Start with circles near neck above collarbones, 5 times each side.    Cancer Rehab [phone] Deep Effective Breath   Standing, sitting, or laying down, place both hands on the belly. Take a deep breath IN, expanding the belly; then breath OUT, contracting the belly. Repeat __5__ times. Do __2-3__ sessions per day and before your self massage.  Axilla to Axilla - Sweep   On uninvolved side make 5 circles in the armpit, then pump _5__ times from involved armpit across chest to uninvolved armpit, making a pathway. Do _1__ time per day.  Copyright  VHI. All rights reserved.  Axilla to Inguinal Nodes - Sweep   On involved side, make 5 circles at groin at panty line, then pump _5__ times from armpit along side of trunk to outer hip, making your other pathway. Do __1_ time per day.  Copyright  VHI. All rights reserved.  Arm Posterior: Elbow to Shoulder - Sweep   Pump _5__ times from back of elbow to top of shoulder. Then inner to outer upper arm _5_ times, then outer arm again _5_ times. Then back to the pathways _2-3_ times. Do _1__ time per day.  Copyright  VHI. All rights reserved.  ARM: Volar Wrist to Elbow - Sweep   Pump or stationary circles _5__ times from wrist to elbow making sure to do both sides of the forearm. Then retrace your steps to the outer arm, and the pathways _2-3_ times each. Do _1__ time per day.  Copyright  VHI. All rights reserved.  ARM: Dorsum of Hand to Shoulder - Sweep   Pump or stationary circles _5__ times on back of hand including knuckle spaces and individual fingers if needed working up towards the wrist, then retrace all your steps working back up the forearm, doing both sides; upper outer arm and back to your pathways _2-3_ times each. Then do 5 circles again at uninvolved armpit and involved groin where you started! Good job!! Do __1_ time per day.  Copyright  VHI. All rights reserved.

## 2022-08-25 ENCOUNTER — Ambulatory Visit: Payer: BC Managed Care – PPO | Admitting: Rehabilitation

## 2022-08-25 ENCOUNTER — Encounter: Payer: Self-pay | Admitting: Rehabilitation

## 2022-08-25 DIAGNOSIS — R293 Abnormal posture: Secondary | ICD-10-CM | POA: Diagnosis not present

## 2022-08-25 DIAGNOSIS — Z483 Aftercare following surgery for neoplasm: Secondary | ICD-10-CM

## 2022-08-25 DIAGNOSIS — R600 Localized edema: Secondary | ICD-10-CM | POA: Diagnosis not present

## 2022-08-25 DIAGNOSIS — I890 Lymphedema, not elsewhere classified: Secondary | ICD-10-CM

## 2022-08-25 DIAGNOSIS — Z170 Estrogen receptor positive status [ER+]: Secondary | ICD-10-CM | POA: Diagnosis not present

## 2022-08-25 DIAGNOSIS — C50412 Malignant neoplasm of upper-outer quadrant of left female breast: Secondary | ICD-10-CM

## 2022-08-25 NOTE — Therapy (Signed)
OUTPATIENT PHYSICAL THERAPY ONCOLOGY TREATMENT  Patient Name: Tracy Dyer MRN: [id number] DOB:09/24/1970, 52 y.o., female Today's Date: 08/25/2022   PT End of Session - 08/25/22 0805     Visit Number 10    Number of Visits 14    Date for PT Re-Evaluation 09/02/22    PT Start Time 0806    PT Stop Time 0853    PT Time Calculation (min) 47 min    Activity Tolerance Patient tolerated treatment well    Behavior During Therapy [facility] for tasks assessed/performed               Past Medical History:  Diagnosis Date   Anxiety    Cancer ([place])    breast   Depression    Family history of breast cancer    Family history of lung cancer    Family history of ovarian cancer    GERD (gastroesophageal reflux disease)    diet controlled   History of radiation therapy    Left breast, left axilla- 05/27/21-07/10/21- Dr. [name]   Seasonal allergies    Past Surgical History:  Procedure Laterality Date   BREAST BIOPSY Left 2019   x 2 biopsy   BREAST EXCISIONAL BIOPSY Left    BREAST LUMPECTOMY WITH RADIOACTIVE SEED AND SENTINEL LYMPH NODE BIOPSY Left 02/26/2021   Procedure: LEFT BREAST LUMPECTOMY WITH RADIOACTIVE SEED x2; LEFT AXILLARY SENTINEL NODE BIOPSY;  Surgeon: [name], MD;  Location: [place];  Service: General;  Laterality: Left;   BREAST LUMPECTOMY WITH RADIOFREQUENCY TAG IDENTIFICATION Left 3/71/0626   Procedure: LEFT BREAST MRI WIRE GUIDED EXCISION;  Surgeon: [name], MD;  Location: [place];  Service: General;  Laterality: Left;   BREAST REDUCTION SURGERY Bilateral 04/18/2021   Procedure: MAMMARY REDUCTION  (BREAST) LEFT ONCOPLASTIC BREAST RECONSTRUCTION, RIGHT BREAST REDUCTION;  Surgeon: Irene Limbo, MD;  Location: [place];  Service: Plastics;  Laterality: Bilateral;   CHOLECYSTECTOMY  2008   PORT-A-CATH REMOVAL Right 04/18/2021   Procedure: REMOVAL PORT-A-CATH;  Surgeon: [name], MD;   Location: [place];  Service: General;  Laterality: Right;   PORTACATH PLACEMENT N/A 09/05/2020   Procedure: INSERTION PORT-A-CATH;  Surgeon: [name], MD;  Location: [facility];  Service: General;  Laterality: N/A;  [employer] EXTRACTION     Patient Active Problem List   Diagnosis Date Noted   Occipital neuralgia of right side 07/01/2021   Peripheral vertigo 07/01/2021   Genetic testing 09/04/2020   Family history of breast cancer    Family history of ovarian cancer    Family history of lung cancer    Malignant neoplasm of upper-outer quadrant of left breast in female, estrogen receptor positive ([place]) 08/21/2020   Anxiety 11/16/2013   Hypercoagulable state ([place]) 11/16/2013   TIA (transient ischemic attack) 11/15/2013   Chest discomfort 01/20/2012   Factor V Leiden ([place]) 01/20/2012   MVP (mitral valve prolapse) 01/20/2012   Acute bronchitis 12/23/2011    PCP: [name], MD  REFERRING PROVIDER: [name], MD   REFERRING DIAG: [phone] (ICD-10-CM) - Malignant neoplasm of upper-outer quadrant of left breast in female, estrogen receptor positive ([place])  THERAPY DIAG:  Aftercare following surgery for neoplasm  Malignant neoplasm of upper-outer quadrant of left breast in female, estrogen receptor positive ([place])  Lymphedema, not elsewhere classified  Abnormal posture  Localized edema  ONSET DATE: 03/03/22  Rationale for Evaluation and Treatment Rehabilitation  SUBJECTIVE  SUBJECTIVE STATEMENT: I have been wearing my compression a lot more.  I have been wearing both.    PERTINENT HISTORY:   Lt lumpectomy with SLNB 0/4 LN on 02/26/21, chemotherapy and radiation completed ; ER/PR+, HER2-.  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   PAIN:  Are you having  pain? No NPRS scale: up to a 0/10  Pain location: left forearm  Pain orientation: Left  PAIN TYPE: discomfort Pain description: intermittent  Aggravating factors: trying to carry something, something tight on arm Relieving factors: nothing  PRECAUTIONS: Other: LUE lymphedema risk  WEIGHT BEARING RESTRICTIONS No  FALLS:  Has patient fallen in last 6 months? No  LIVING ENVIRONMENT: Lives with: lives with their family Lives in: House/apartment Has following equipment at home: None  OCCUPATION: full time, designer  LEISURE: pt is walking 30 min/5 days per week, swimming  HAND DOMINANCE : right   PRIOR LEVEL OF FUNCTION: Independent  PATIENT GOALS  : decrease pain    OBJECTIVE  COGNITION:  Overall cognitive status: Within functional limits for tasks assessed   PALPATION: Cording from axilla to wrist by the thumb  OBSERVATIONS / OTHER ASSESSMENTS: no visible swelling  POSTURE: forward head and rounded shoulders  UPPER EXTREMITY AROM/PROM:  A/PROM RIGHT   eval    Shoulder extension 68   Shoulder flexion 160   Shoulder abduction 160   Shoulder internal rotation    Shoulder external rotation      (Blank rows = not tested)  A/PROM LEFT   eval 08/05/22  Shoulder extension 70   Shoulder flexion 166 143 - pull wrist to axilla   Shoulder abduction 173 170  Shoulder internal rotation    Shoulder external rotation      (Blank rows = not tested)  Pain with dequervains stretch position  50# grip strength bilaterally  Strength 4+/5   LYMPHEDEMA ASSESSMENTS:  SURGERY TYPE/DATE: Left lumpectomy and SLNB 02/26/21 NUMBER OF LYMPH NODES REMOVED: 0/4 CHEMOTHERAPY: completed RADIATION: completed HORMONE TREATMENT: had to stop due to side effects INFECTIONS: none reported   LYMPHEDEMA ASSESSMENTS:   LANDMARK RIGHT  eval  10 cm proximal to olecranon process 33.2  Olecranon process 26.8  10 cm proximal to ulnar styloid process 24  Just proximal to ulnar styloid  process 16.5  Across hand at thumb web space 19.9  At base of 2nd digit 6.4  (Blank rows = not tested)  LANDMARK LEFT  eval  10 cm proximal to olecranon process 34.5  Olecranon process 26.7  10 cm proximal to ulnar styloid process 24.1  Just proximal to ulnar styloid process 16.3  Across hand at thumb web space 20  At base of 2nd digit 7  (Blank rows = not tested)   TODAY'S TREATMENT  Pt permission and consent throughout each step of examination and treatment with modification and draping if requested when working on sensitive areas  08/25/22 Manual Therapy MFR in supine to cording MLD review in seated x 13mn: Performed full sequence and began instructing pt while performing due to her change from baseline with SOZO screen still being elevated, more so than 1 month ago: Short neck, 5 diaphragmatic breaths, Lt inguinal and Rt axillary nodes, Lt axillo-inguinal and anterior inter-axillary anastomosis, then focused on Lt UE working from distal to proximal then reacting all steps per handout (see below) that was issued as well. Had pt return some of steps to assess her pressure and skin stretch vs slide. Handover hand technique used for this.  08/19/22: Redid SOZO and her change from baseline os slightly more elevated than last time with a score of 8. Pt reports she wasn't 100% compliant with wear of her compression garments so will work on this for next 30 days.  Therapeutic Exercises Pulleys into flexion and abduction x2 mins each with VC's to remind pt to relax shoulders Modified downward dog on wall 5x, 5 sec holds returning therapist demo  Manual Therapy MFR in supine to cording MLD: Performed full sequence and began instructing pt while performing due to her change from baseline with SOZO screen still being elevated, more so than 1 month ago: Short neck, 5 diaphragmatic breaths, Lt inguinal and Rt axillary nodes, Lt axillo-inguinal and anterior inter-axillary anastomosis, then focused  on Lt UE working from distal to proximal then reacting all steps per handout (see below) that was issued as well. Had pt return some of steps to assess her pressure and skin stretch vs slide. Handover hand technique used for this.   08/17/22 Pulleys into flexion and abduction x 81mn each Wall single arm chest stretch 2x20" STM/MFR following cording from axilla to wrist with PROM and pinning included PROM to tolerance MLD following cording work for [employer]  PATIENT EDUCATION:  Education details: Self MLD Person educated: Patient Education method: EConsulting civil engineer DMedia planner handout issued Education comprehension: verbalized understanding, returned demonstration and will benefit from further review  HOME EXERCISE PROGRAM: Wear compression sleeve - stretches for cording and add daily self MLD  ASSESSMENT: CLINICAL IMPRESSION: Pt has improved cording today.  Reviewed MLD in seated with pt still needing vcs for sequence, pressure, and to not use such small movements.  Improved compression wear seems to also be helping cording.   OBJECTIVE IMPAIRMENTS decreased knowledge of use of DME, increased edema, increased fascial restrictions, postural dysfunction, and pain.   ACTIVITY LIMITATIONS  none  PARTICIPATION LIMITATIONS:  none  PERSONAL FACTORS  none  are also affecting patient's functional outcome.   REHAB POTENTIAL: Excellent  CLINICAL DECISION MAKING: Stable/uncomplicated  EVALUATION COMPLEXITY: Low  GOALS: Goals reviewed with patient? Yes  SHORT TERM GOALS=LONG TERM GOALS  Target date: 09/02/2022    Pt will return Lt UE AROM to prior levels to improve mobility  Baseline: Goal status: INITIAL  2.  Pt will report no limitations in ADLs from cording discomfort Baseline:  Goal status: INITIAL  PLAN: PT FREQUENCY: 2x/week  PT DURATION: 4 weeks  PLANNED INTERVENTIONS: Therapeutic exercises, Therapeutic activity, Patient/Family education, Self Care, Joint mobilization,  Orthotic/Fit training, Manual lymph drainage, Compression bandaging, scar mobilization, Taping, and Manual therapy  PLAN FOR NEXT SESSION: MFR to cording in L UE, MLD to Lt UE-cont this and review her technique * Repeat SOZO around 2/17   TStark Bray PT 08/25/2022, 8:56 AM    Start with circles near neck above collarbones, 5 times each side.    Cancer Rehab [phone] Deep Effective Breath   Standing, sitting, or laying down, place both hands on the belly. Take a deep breath IN, expanding the belly; then breath OUT, contracting the belly. Repeat __5__ times. Do __2-3__ sessions per day and before your self massage.  Axilla to Axilla - Sweep   On uninvolved side make 5 circles in the armpit, then pump _5__ times from involved armpit across chest to uninvolved armpit, making a pathway. Do _1__ time per day.  Copyright  VHI. All rights reserved.  Axilla to Inguinal Nodes - Sweep   On involved side, make 5 circles at groin at  panty line, then pump _5__ times from armpit along side of trunk to outer hip, making your other pathway. Do __1_ time per day.  Copyright  VHI. All rights reserved.  Arm Posterior: Elbow to Shoulder - Sweep   Pump _5__ times from back of elbow to top of shoulder. Then inner to outer upper arm _5_ times, then outer arm again _5_ times. Then back to the pathways _2-3_ times. Do _1__ time per day.  Copyright  VHI. All rights reserved.  ARM: Volar Wrist to Elbow - Sweep   Pump or stationary circles _5__ times from wrist to elbow making sure to do both sides of the forearm. Then retrace your steps to the outer arm, and the pathways _2-3_ times each. Do _1__ time per day.  Copyright  VHI. All rights reserved.  ARM: Dorsum of Hand to Shoulder - Sweep   Pump or stationary circles _5__ times on back of hand including knuckle spaces and individual fingers if needed working up towards the wrist, then retrace all your steps working back up the forearm, doing  both sides; upper outer arm and back to your pathways _2-3_ times each. Then do 5 circles again at uninvolved armpit and involved groin where you started! Good job!! Do __1_ time per day.

## 2022-08-27 ENCOUNTER — Ambulatory Visit: Payer: BC Managed Care – PPO | Admitting: Rehabilitation

## 2022-08-27 ENCOUNTER — Encounter: Payer: Self-pay | Admitting: Rehabilitation

## 2022-08-27 DIAGNOSIS — R293 Abnormal posture: Secondary | ICD-10-CM

## 2022-08-27 DIAGNOSIS — C50412 Malignant neoplasm of upper-outer quadrant of left female breast: Secondary | ICD-10-CM | POA: Diagnosis not present

## 2022-08-27 DIAGNOSIS — R600 Localized edema: Secondary | ICD-10-CM

## 2022-08-27 DIAGNOSIS — Z170 Estrogen receptor positive status [ER+]: Secondary | ICD-10-CM | POA: Diagnosis not present

## 2022-08-27 DIAGNOSIS — Z483 Aftercare following surgery for neoplasm: Secondary | ICD-10-CM | POA: Diagnosis not present

## 2022-08-27 DIAGNOSIS — I890 Lymphedema, not elsewhere classified: Secondary | ICD-10-CM | POA: Diagnosis not present

## 2022-08-27 NOTE — Therapy (Signed)
OUTPATIENT PHYSICAL THERAPY ONCOLOGY TREATMENT  Patient Name: Tracy Dyer MRN: [id number] DOB:08-May-1971, 52 y.o., female Today's Date: 08/27/2022   PT End of Session - 08/27/22 0813     Visit Number 11    Number of Visits 14    Date for PT Re-Evaluation 09/02/22    PT Start Time 0814    PT Stop Time 0859    PT Time Calculation (min) 45 min    Activity Tolerance Patient tolerated treatment well    Behavior During Therapy [facility] for tasks assessed/performed               Past Medical History:  Diagnosis Date   Anxiety    Cancer ([place])    breast   Depression    Family history of breast cancer    Family history of lung cancer    Family history of ovarian cancer    GERD (gastroesophageal reflux disease)    diet controlled   History of radiation therapy    Left breast, left axilla- 05/27/21-07/10/21- Dr. [name]   Seasonal allergies    Past Surgical History:  Procedure Laterality Date   BREAST BIOPSY Left 2019   x 2 biopsy   BREAST EXCISIONAL BIOPSY Left    BREAST LUMPECTOMY WITH RADIOACTIVE SEED AND SENTINEL LYMPH NODE BIOPSY Left 02/26/2021   Procedure: LEFT BREAST LUMPECTOMY WITH RADIOACTIVE SEED x2; LEFT AXILLARY SENTINEL NODE BIOPSY;  Surgeon: [name], MD;  Location: [place];  Service: General;  Laterality: Left;   BREAST LUMPECTOMY WITH RADIOFREQUENCY TAG IDENTIFICATION Left 9/48/5462   Procedure: LEFT BREAST MRI WIRE GUIDED EXCISION;  Surgeon: [name], MD;  Location: [place];  Service: General;  Laterality: Left;   BREAST REDUCTION SURGERY Bilateral 04/18/2021   Procedure: MAMMARY REDUCTION  (BREAST) LEFT ONCOPLASTIC BREAST RECONSTRUCTION, RIGHT BREAST REDUCTION;  Surgeon: Irene Limbo, MD;  Location: [place];  Service: Plastics;  Laterality: Bilateral;   CHOLECYSTECTOMY  2008   PORT-A-CATH REMOVAL Right 04/18/2021   Procedure: REMOVAL PORT-A-CATH;  Surgeon: [name], MD;   Location: [place];  Service: General;  Laterality: Right;   PORTACATH PLACEMENT N/A 09/05/2020   Procedure: INSERTION PORT-A-CATH;  Surgeon: [name], MD;  Location: [facility] OR;  Service: General;  Laterality: N/A;  [place] EXTRACTION     Patient Active Problem List   Diagnosis Date Noted   Occipital neuralgia of right side 07/01/2021   Peripheral vertigo 07/01/2021   Genetic testing 09/04/2020   Family history of breast cancer    Family history of ovarian cancer    Family history of lung cancer    Malignant neoplasm of upper-outer quadrant of left breast in female, estrogen receptor positive ([place]) 08/21/2020   Anxiety 11/16/2013   Hypercoagulable state ([place]) 11/16/2013   TIA (transient ischemic attack) 11/15/2013   Chest discomfort 01/20/2012   Factor V Leiden ([place]) 01/20/2012   MVP (mitral valve prolapse) 01/20/2012   Acute bronchitis 12/23/2011    PCP: [name], MD  REFERRING PROVIDER: [name], MD   REFERRING DIAG: [phone] (ICD-10-CM) - Malignant neoplasm of upper-outer quadrant of left breast in female, estrogen receptor positive ([place])  THERAPY DIAG:  Aftercare following surgery for neoplasm  Malignant neoplasm of upper-outer quadrant of left breast in female, estrogen receptor positive ([place])  Lymphedema, not elsewhere classified  Abnormal posture  Localized edema  ONSET DATE: 03/03/22  Rationale for Evaluation and Treatment Rehabilitation  SUBJECTIVE  SUBJECTIVE STATEMENT: I have been feeling just depressed lately due to my situation and my husband.  He does not have cancer.   PERTINENT HISTORY:   Lt lumpectomy with SLNB 0/4 LN on 02/26/21, chemotherapy and radiation completed ; ER/PR+, HER2-.  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    PAIN:  Are you having pain? No NPRS scale: up to a 0/10  Pain location: left forearm  Pain orientation: Left  PAIN TYPE: discomfort Pain description: intermittent  Aggravating factors: trying to carry something, something tight on arm Relieving factors: nothing  PRECAUTIONS: Other: LUE lymphedema risk  WEIGHT BEARING RESTRICTIONS No  FALLS:  Has patient fallen in last 6 months? No  LIVING ENVIRONMENT: Lives with: lives with their family Lives in: House/apartment Has following equipment at home: None  OCCUPATION: full time, designer  LEISURE: pt is walking 30 min/5 days per week, swimming  HAND DOMINANCE : right   PRIOR LEVEL OF FUNCTION: Independent  PATIENT GOALS  : decrease pain    OBJECTIVE  COGNITION:  Overall cognitive status: Within functional limits for tasks assessed   PALPATION: Cording from axilla to wrist by the thumb  OBSERVATIONS / OTHER ASSESSMENTS: no visible swelling  POSTURE: forward head and rounded shoulders  UPPER EXTREMITY AROM/PROM:  A/PROM RIGHT   eval    Shoulder extension 68   Shoulder flexion 160   Shoulder abduction 160   Shoulder internal rotation    Shoulder external rotation      (Blank rows = not tested)  A/PROM LEFT   eval 08/05/22  Shoulder extension 70   Shoulder flexion 166 143 - pull wrist to axilla   Shoulder abduction 173 170  Shoulder internal rotation    Shoulder external rotation      (Blank rows = not tested)  Pain with dequervains stretch position  50# grip strength bilaterally  Strength 4+/5   LYMPHEDEMA ASSESSMENTS:  SURGERY TYPE/DATE: Left lumpectomy and SLNB 02/26/21 NUMBER OF LYMPH NODES REMOVED: 0/4 CHEMOTHERAPY: completed RADIATION: completed HORMONE TREATMENT: had to stop due to side effects INFECTIONS: none reported   LYMPHEDEMA ASSESSMENTS:   LANDMARK RIGHT  eval  10 cm proximal to olecranon process 33.2  Olecranon process 26.8  10 cm proximal to ulnar styloid process 24  Just  proximal to ulnar styloid process 16.5  Across hand at thumb web space 19.9  At base of 2nd digit 6.4  (Blank rows = not tested)  LANDMARK LEFT  eval  10 cm proximal to olecranon process 34.5  Olecranon process 26.7  10 cm proximal to ulnar styloid process 24.1  Just proximal to ulnar styloid process 16.3  Across hand at thumb web space 20  At base of 2nd digit 7  (Blank rows = not tested)   TODAY'S TREATMENT  Pt permission and consent throughout each step of examination and treatment with modification and draping if requested when working on sensitive areas 08/27/22 In supine: Short neck, 5 diaphragmatic breaths, superficial and deep abdominals, bil axillary nodes and establishment of interaxillary pathway, L inguinal nodes and establishment of axilloinguinal pathway, then L UE working proximal to distal, moving fluid from upper inner arm outwards, and doing both sides of forearm moving fluid towards pathways spending extra time in any areas of fibrosis then retracing all steps. PROM into all directions  08/25/22 Manual Therapy MFR in supine to cording MLD review in seated x 64mn: Performed full sequence and began instructing pt while performing due to her change from baseline with  SOZO screen still being elevated, more so than 1 month ago: Short neck, 5 diaphragmatic breaths, Lt inguinal and Rt axillary nodes, Lt axillo-inguinal and anterior inter-axillary anastomosis, then focused on Lt UE working from distal to proximal then reacting all steps per handout (see below) that was issued as well. Had pt return some of steps to assess her pressure and skin stretch vs slide. Handover hand technique used for this.  08/19/22: Redid SOZO and her change from baseline os slightly more elevated than last time with a score of 8. Pt reports she wasn't 100% compliant with wear of her compression garments so will work on this for next 30 days.  Therapeutic Exercises Pulleys into flexion and abduction  x2 mins each with VC's to remind pt to relax shoulders Modified downward dog on wall 5x, 5 sec holds returning therapist demo  Manual Therapy MFR in supine to cording MLD: Performed full sequence and began instructing pt while performing due to her change from baseline with SOZO screen still being elevated, more so than 1 month ago: Short neck, 5 diaphragmatic breaths, Lt inguinal and Rt axillary nodes, Lt axillo-inguinal and anterior inter-axillary anastomosis, then focused on Lt UE working from distal to proximal then reacting all steps per handout (see below) that was issued as well. Had pt return some of steps to assess her pressure and skin stretch vs slide. Handover hand technique used for this.   08/17/22 Pulleys into flexion and abduction x 72mn each Wall single arm chest stretch 2x20" STM/MFR following cording from axilla to wrist with PROM and pinning included PROM to tolerance MLD following cording work for [employer]  PATIENT EDUCATION:  Education details: Self MLD Person educated: Patient Education method: EConsulting civil engineer DMedia planner handout issued Education comprehension: verbalized understanding, returned demonstration and will benefit from further review  HOME EXERCISE PROGRAM: Wear compression sleeve - stretches for cording and add daily self MLD  ASSESSMENT: CLINICAL IMPRESSION: Focused on MLD and edema/inflammation reduction today as cording is not very evident today.  Discussed benefits of talk therapy due to life situations.   OBJECTIVE IMPAIRMENTS decreased knowledge of use of DME, increased edema, increased fascial restrictions, postural dysfunction, and pain.   ACTIVITY LIMITATIONS  none  PARTICIPATION LIMITATIONS:  none  PERSONAL FACTORS  none  are also affecting patient's functional outcome.   REHAB POTENTIAL: Excellent  CLINICAL DECISION MAKING: Stable/uncomplicated  EVALUATION COMPLEXITY: Low  GOALS: Goals reviewed with patient? Yes  SHORT TERM  GOALS=LONG TERM GOALS  Target date: 09/02/2022    Pt will return Lt UE AROM to prior levels to improve mobility  Baseline: Goal status: INITIAL  2.  Pt will report no limitations in ADLs from cording discomfort Baseline:  Goal status: INITIAL  PLAN: PT FREQUENCY: 2x/week  PT DURATION: 4 weeks  PLANNED INTERVENTIONS: Therapeutic exercises, Therapeutic activity, Patient/Family education, Self Care, Joint mobilization, Orthotic/Fit training, Manual lymph drainage, Compression bandaging, scar mobilization, Taping, and Manual therapy  PLAN FOR NEXT SESSION: MFR to cording in L UE, MLD to Lt UE-cont this and review her technique * Repeat SOZO around 2/17   TStark Bray PT 08/27/2022, 9:02 AM    Start with circles near neck above collarbones, 5 times each side.    Cancer Rehab [phone] Deep Effective Breath   Standing, sitting, or laying down, place both hands on the belly. Take a deep breath IN, expanding the belly; then breath OUT, contracting the belly. Repeat __5__ times. Do __2-3__ sessions per day and before your self massage.  Axilla to Axilla - Sweep   On uninvolved side make 5 circles in the armpit, then pump _5__ times from involved armpit across chest to uninvolved armpit, making a pathway. Do _1__ time per day.  Copyright  VHI. All rights reserved.  Axilla to Inguinal Nodes - Sweep   On involved side, make 5 circles at groin at panty line, then pump _5__ times from armpit along side of trunk to outer hip, making your other pathway. Do __1_ time per day.  Copyright  VHI. All rights reserved.  Arm Posterior: Elbow to Shoulder - Sweep   Pump _5__ times from back of elbow to top of shoulder. Then inner to outer upper arm _5_ times, then outer arm again _5_ times. Then back to the pathways _2-3_ times. Do _1__ time per day.  Copyright  VHI. All rights reserved.  ARM: Volar Wrist to Elbow - Sweep   Pump or stationary circles _5__ times from wrist to elbow  making sure to do both sides of the forearm. Then retrace your steps to the outer arm, and the pathways _2-3_ times each. Do _1__ time per day.  Copyright  VHI. All rights reserved.  ARM: Dorsum of Hand to Shoulder - Sweep   Pump or stationary circles _5__ times on back of hand including knuckle spaces and individual fingers if needed working up towards the wrist, then retrace all your steps working back up the forearm, doing both sides; upper outer arm and back to your pathways _2-3_ times each. Then do 5 circles again at uninvolved armpit and involved groin where you started! Good job!! Do __1_ time per day.

## 2022-08-31 DIAGNOSIS — C50412 Malignant neoplasm of upper-outer quadrant of left female breast: Secondary | ICD-10-CM | POA: Diagnosis not present

## 2022-09-01 ENCOUNTER — Ambulatory Visit: Payer: BC Managed Care – PPO | Admitting: Rehabilitation

## 2022-09-02 ENCOUNTER — Ambulatory Visit: Payer: BC Managed Care – PPO | Admitting: Rehabilitation

## 2022-09-02 ENCOUNTER — Encounter: Payer: Self-pay | Admitting: Rehabilitation

## 2022-09-02 DIAGNOSIS — R293 Abnormal posture: Secondary | ICD-10-CM | POA: Diagnosis not present

## 2022-09-02 DIAGNOSIS — C50412 Malignant neoplasm of upper-outer quadrant of left female breast: Secondary | ICD-10-CM

## 2022-09-02 DIAGNOSIS — Z483 Aftercare following surgery for neoplasm: Secondary | ICD-10-CM

## 2022-09-02 DIAGNOSIS — R600 Localized edema: Secondary | ICD-10-CM | POA: Diagnosis not present

## 2022-09-02 DIAGNOSIS — Z170 Estrogen receptor positive status [ER+]: Secondary | ICD-10-CM | POA: Diagnosis not present

## 2022-09-02 DIAGNOSIS — I890 Lymphedema, not elsewhere classified: Secondary | ICD-10-CM | POA: Diagnosis not present

## 2022-09-02 NOTE — Therapy (Signed)
OUTPATIENT PHYSICAL THERAPY ONCOLOGY TREATMENT  Patient Name: Tracy Dyer MRN: [id number] DOB:[birth year], 52 y.o., female Today's Date: 09/02/2022   PT End of Session - 09/02/22 1603     Visit Number 12    Number of Visits 20    Date for PT Re-Evaluation 09/30/22    PT Start Time 1605    PT Stop Time 5465    PT Time Calculation (min) 65 min    Activity Tolerance Patient tolerated treatment well    Behavior During Therapy WFL for tasks assessed/performed               Past Medical History:  Diagnosis Date   Anxiety    Cancer ([place])    breast   Depression    Family history of breast cancer    Family history of lung cancer    Family history of ovarian cancer    GERD (gastroesophageal reflux disease)    diet controlled   History of radiation therapy    Left breast, left axilla- 05/27/21-07/10/21- Dr. [name]   Seasonal allergies    Past Surgical History:  Procedure Laterality Date   BREAST BIOPSY Left 2019   x 2 biopsy   BREAST EXCISIONAL BIOPSY Left    BREAST LUMPECTOMY WITH RADIOACTIVE SEED AND SENTINEL LYMPH NODE BIOPSY Left 02/26/2021   Procedure: LEFT BREAST LUMPECTOMY WITH RADIOACTIVE SEED x2; LEFT AXILLARY SENTINEL NODE BIOPSY;  Surgeon: [name], MD;  Location: [place];  Service: General;  Laterality: Left;   BREAST LUMPECTOMY WITH RADIOFREQUENCY TAG IDENTIFICATION Left 6/81/2751   Procedure: LEFT BREAST MRI WIRE GUIDED EXCISION;  Surgeon: [name], MD;  Location: [place];  Service: General;  Laterality: Left;   BREAST REDUCTION SURGERY Bilateral 04/18/2021   Procedure: MAMMARY REDUCTION  (BREAST) LEFT ONCOPLASTIC BREAST RECONSTRUCTION, RIGHT BREAST REDUCTION;  Surgeon: Irene Limbo, MD;  Location: [facility];  Service: Plastics;  Laterality: Bilateral;   CHOLECYSTECTOMY  2008   PORT-A-CATH REMOVAL Right 04/18/2021   Procedure: REMOVAL PORT-A-CATH;  Surgeon: [name], MD;   Location: [place];  Service: General;  Laterality: Right;   PORTACATH PLACEMENT N/A 09/05/2020   Procedure: INSERTION PORT-A-CATH;  Surgeon: [name], MD;  Location: [facility];  Service: General;  Laterality: N/A;  [place] EXTRACTION     Patient Active Problem List   Diagnosis Date Noted   Occipital neuralgia of right side 07/01/2021   Peripheral vertigo 07/01/2021   Genetic testing 09/04/2020   Family history of breast cancer    Family history of ovarian cancer    Family history of lung cancer    Malignant neoplasm of upper-outer quadrant of left breast in female, estrogen receptor positive ([place]) 08/21/2020   Anxiety 11/16/2013   Hypercoagulable state ([place]) 11/16/2013   TIA (transient ischemic attack) 11/15/2013   Chest discomfort 01/20/2012   Factor V Leiden ([place]) 01/20/2012   MVP (mitral valve prolapse) 01/20/2012   Acute bronchitis 12/23/2011    PCP: [name], MD  REFERRING PROVIDER: [name], MD   REFERRING DIAG: [phone] (ICD-10-CM) - Malignant neoplasm of upper-outer quadrant of left breast in female, estrogen receptor positive ([place])  THERAPY DIAG:  Aftercare following surgery for neoplasm  Malignant neoplasm of upper-outer quadrant of left breast in female, estrogen receptor positive ([place])  Lymphedema, not elsewhere classified  Abnormal posture  Localized edema  ONSET DATE: 03/03/22  Rationale for Evaluation and Treatment Rehabilitation  SUBJECTIVE  SUBJECTIVE STATEMENT: I really needed to get in. It is starting to swell more.   PERTINENT HISTORY:   Lt lumpectomy with SLNB 0/4 LN on 02/26/21, chemotherapy and radiation completed ; ER/PR+, HER2-.  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   PAIN:  Are you having pain? No NPRS scale:  up to a 3/10  Pain location: left forearm  Pain orientation: Left  PAIN TYPE: discomfort Pain description: intermittent  Aggravating factors: trying to carry something, something tight on arm Relieving factors: nothing  PRECAUTIONS: Other: LUE lymphedema risk  WEIGHT BEARING RESTRICTIONS No  FALLS:  Has patient fallen in last 6 months? No  LIVING ENVIRONMENT: Lives with: lives with their family Lives in: House/apartment Has following equipment at home: None  OCCUPATION: full time, designer  LEISURE: pt is walking 30 min/5 days per week, swimming  HAND DOMINANCE : right   PRIOR LEVEL OF FUNCTION: Independent  PATIENT GOALS  : decrease pain    OBJECTIVE  COGNITION:  Overall cognitive status: Within functional limits for tasks assessed   PALPATION: Cording from axilla to wrist by the thumb  OBSERVATIONS / OTHER ASSESSMENTS: no visible swelling  POSTURE: forward head and rounded shoulders  UPPER EXTREMITY AROM/PROM:  A/PROM RIGHT   eval    Shoulder extension 68   Shoulder flexion 160   Shoulder abduction 160   Shoulder internal rotation    Shoulder external rotation      (Blank rows = not tested)  A/PROM LEFT   eval 08/05/22  Shoulder extension 70   Shoulder flexion 166 143 - pull wrist to axilla   Shoulder abduction 173 170  Shoulder internal rotation    Shoulder external rotation      (Blank rows = not tested)  Pain with dequervains stretch position  50# grip strength bilaterally  Strength 4+/5   LYMPHEDEMA ASSESSMENTS:  SURGERY TYPE/DATE: Left lumpectomy and SLNB 02/26/21 NUMBER OF LYMPH NODES REMOVED: 0/4 CHEMOTHERAPY: completed RADIATION: completed HORMONE TREATMENT: had to stop due to side effects INFECTIONS: none reported   LYMPHEDEMA ASSESSMENTS:   LANDMARK RIGHT  eval  10 cm proximal to olecranon process 33.2  Olecranon process 26.8  10 cm proximal to ulnar styloid process 24  Just proximal to ulnar styloid process 16.5  Across  hand at thumb web space 19.9  At base of 2nd digit 6.4  (Blank rows = not tested)  LANDMARK LEFT  eval  10 cm proximal to olecranon process 34.5  Olecranon process 26.7  10 cm proximal to ulnar styloid process 24.1  Just proximal to ulnar styloid process 16.3  Across hand at thumb web space 20  At base of 2nd digit 7  (Blank rows = not tested)   TODAY'S TREATMENT  Pt permission and consent throughout each step of examination and treatment with modification and draping if requested when working on sensitive areas  09/03/23 Redid SOZO which is now elevated by 16 in the red zone.  In supine: Short neck, 5 diaphragmatic breaths, superficial and deep abdominals, bil axillary nodes and establishment of interaxillary pathway, L inguinal nodes and establishment of axilloinguinal pathway, then L UE working proximal to distal, moving fluid from upper inner arm outwards, and doing both sides of forearm moving fluid towards pathways spending extra time in any areas of fibrosis then retracing all steps. PROM into all directions Gentle cording release forearm and elbow but more focus on gentle MLD techniques today Discussed bandaging for overnight with pt agreeable: Applied tg soft from  wrist to hand, fingers 1-4, artiflex from hand to axilla, 1 6cm hand bandage moving up forearm, and 1 8cm bandage from wrist to axilla with some herringbone at the widest forearm included.  Education on wearing bandage overnight and as long as she can into the next day and then switching back into her regular sleeves and pt knows to remove if uncomfortable or painful.  Edu on remedial exercises Showed pt link to MD [place] video and gave her extra 8cm bandage as 10cm was out if she wanted to try another layer.   08/27/22 In supine: Short neck, 5 diaphragmatic breaths, superficial and deep abdominals, bil axillary nodes and establishment of interaxillary pathway, L inguinal nodes and establishment of axilloinguinal  pathway, then L UE working proximal to distal, moving fluid from upper inner arm outwards, and doing both sides of forearm moving fluid towards pathways spending extra time in any areas of fibrosis then retracing all steps. PROM into all directions  08/25/22 Manual Therapy MFR in supine to cording MLD review in seated x 8mn: Performed full sequence and began instructing pt while performing due to her change from baseline with SOZO screen still being elevated, more so than 1 month ago: Short neck, 5 diaphragmatic breaths, Lt inguinal and Rt axillary nodes, Lt axillo-inguinal and anterior inter-axillary anastomosis, then focused on Lt UE working from distal to proximal then reacting all steps per handout (see below) that was issued as well. Had pt return some of steps to assess her pressure and skin stretch vs slide. Handover hand technique used for this.  08/19/22: Redid SOZO and her change from baseline os slightly more elevated than last time with a score of 8. Pt reports she wasn't 100% compliant with wear of her compression garments so will work on this for next 30 days.  Therapeutic Exercises Pulleys into flexion and abduction x2 mins each with VC's to remind pt to relax shoulders Modified downward dog on wall 5x, 5 sec holds returning therapist demo  Manual Therapy MFR in supine to cording MLD: Performed full sequence and began instructing pt while performing due to her change from baseline with SOZO screen still being elevated, more so than 1 month ago: Short neck, 5 diaphragmatic breaths, Lt inguinal and Rt axillary nodes, Lt axillo-inguinal and anterior inter-axillary anastomosis, then focused on Lt UE working from distal to proximal then reacting all steps per handout (see below) that was issued as well. Had pt return some of steps to assess her pressure and skin stretch vs slide. Handover hand technique used for this.   08/17/22 Pulleys into flexion and abduction x 375m each Wall single  arm chest stretch 2x20" STM/MFR following cording from axilla to wrist with PROM and pinning included PROM to tolerance MLD following cording work for Rt UE  PATIENT EDUCATION:  Education details: Self MLD, compression bandaging basics Person educated: Patient Education method: ExConsulting civil engineerDeMedia plannerhandout issued Education comprehension: verbalized understanding, returned demonstration and will benefit from further review  HOME EXERCISE PROGRAM: Wear compression sleeve - stretches for cording and add daily self MLD  ASSESSMENT: CLINICAL IMPRESSION: Pt had minimal cording last visit and was doing well but had a recent onset of worse cording, more visible edema, and now an increase in SOZO to the red.  Pt is normally seen in the morning but this was after all day not wearing her sleeve bc she could not find it.  Pt was on a zoom mtg throughout treatment so minimal education was performed but  we continued with MLD and added a light bandage for trial.  Due to pts busy work schedule she would most likely benefit from learning bandaging for night and as needed and upgrade to a stronger sleeve like mondi or dynamic.    OBJECTIVE IMPAIRMENTS decreased knowledge of use of DME, increased edema, increased fascial restrictions, postural dysfunction, and pain.   ACTIVITY LIMITATIONS  none  PARTICIPATION LIMITATIONS:  none  PERSONAL FACTORS  none  are also affecting patient's functional outcome.   REHAB POTENTIAL: Excellent  CLINICAL DECISION MAKING: Stable/uncomplicated  EVALUATION COMPLEXITY: Low  GOALS: Goals reviewed with patient? Yes  SHORT TERM GOALS=LONG TERM GOALS  Target date: 09/30/22  Pt will return Lt UE AROM to prior levels to improve mobility  Baseline: Goal status: MET  2.  Pt will report no limitations in ADLs from cording discomfort Baseline:  Goal status: ONGOING  3. Pt will be ind with self MLD and self bandaging for lymphedema management  NEW  PLAN: PT  FREQUENCY: 2x/week  PT DURATION: 4 weeks  PLANNED INTERVENTIONS: Therapeutic exercises, Therapeutic activity, Patient/Family education, Self Care, Joint mobilization, Orthotic/Fit training, Manual lymph drainage, Compression bandaging, scar mobilization, Taping, and Manual therapy  PLAN FOR NEXT SESSION: How was bandaging? Did pt try? Self bandaging education - get circumferences due to new swelling.  (Cont as needed MFR to cording in L UE, MLD to Lt UE-cont this and review her technique * Repeat SOZO around 10/29/22)   Stark Bray, PT 09/02/2022, 5:21 PM    Start with circles near neck above collarbones, 5 times each side.    Cancer Rehab [phone] Deep Effective Breath   Standing, sitting, or laying down, place both hands on the belly. Take a deep breath IN, expanding the belly; then breath OUT, contracting the belly. Repeat __5__ times. Do __2-3__ sessions per day and before your self massage.  Axilla to Axilla - Sweep   On uninvolved side make 5 circles in the armpit, then pump _5__ times from involved armpit across chest to uninvolved armpit, making a pathway. Do _1__ time per day.  Copyright  VHI. All rights reserved.  Axilla to Inguinal Nodes - Sweep   On involved side, make 5 circles at groin at panty line, then pump _5__ times from armpit along side of trunk to outer hip, making your other pathway. Do __1_ time per day.  Copyright  VHI. All rights reserved.  Arm Posterior: Elbow to Shoulder - Sweep   Pump _5__ times from back of elbow to top of shoulder. Then inner to outer upper arm _5_ times, then outer arm again _5_ times. Then back to the pathways _2-3_ times. Do _1__ time per day.  Copyright  VHI. All rights reserved.  ARM: [place] Wrist to Elbow - Sweep   Pump or stationary circles _5__ times from wrist to elbow making sure to do both sides of the forearm. Then retrace your steps to the outer arm, and the pathways _2-3_ times each. Do _1__ time per  day.  Copyright  VHI. All rights reserved.  ARM: Dorsum of Hand to Shoulder - Sweep   Pump or stationary circles _5__ times on back of hand including knuckle spaces and individual fingers if needed working up towards the wrist, then retrace all your steps working back up the forearm, doing both sides; upper outer arm and back to your pathways _2-3_ times each. Then do 5 circles again at uninvolved armpit and involved groin where you started! Good job!! Do  __1_ time per day.

## 2022-09-03 ENCOUNTER — Encounter: Payer: BC Managed Care – PPO | Admitting: Rehabilitation

## 2022-09-07 DIAGNOSIS — M7541 Impingement syndrome of right shoulder: Secondary | ICD-10-CM | POA: Diagnosis not present

## 2022-09-07 DIAGNOSIS — M9907 Segmental and somatic dysfunction of upper extremity: Secondary | ICD-10-CM | POA: Diagnosis not present

## 2022-09-07 DIAGNOSIS — M9901 Segmental and somatic dysfunction of cervical region: Secondary | ICD-10-CM | POA: Diagnosis not present

## 2022-09-07 DIAGNOSIS — M9902 Segmental and somatic dysfunction of thoracic region: Secondary | ICD-10-CM | POA: Diagnosis not present

## 2022-09-08 ENCOUNTER — Encounter: Payer: Self-pay | Admitting: Physical Therapy

## 2022-09-08 ENCOUNTER — Ambulatory Visit: Payer: BC Managed Care – PPO | Attending: Hematology and Oncology | Admitting: Physical Therapy

## 2022-09-08 DIAGNOSIS — I890 Lymphedema, not elsewhere classified: Secondary | ICD-10-CM | POA: Insufficient documentation

## 2022-09-08 DIAGNOSIS — Z170 Estrogen receptor positive status [ER+]: Secondary | ICD-10-CM | POA: Insufficient documentation

## 2022-09-08 DIAGNOSIS — Z483 Aftercare following surgery for neoplasm: Secondary | ICD-10-CM | POA: Diagnosis not present

## 2022-09-08 DIAGNOSIS — C50412 Malignant neoplasm of upper-outer quadrant of left female breast: Secondary | ICD-10-CM | POA: Insufficient documentation

## 2022-09-08 DIAGNOSIS — R600 Localized edema: Secondary | ICD-10-CM | POA: Insufficient documentation

## 2022-09-08 DIAGNOSIS — R293 Abnormal posture: Secondary | ICD-10-CM | POA: Diagnosis not present

## 2022-09-08 LAB — SIGNATERA
SIGNATERA MTM READOUT: 0 MTM/ml
SIGNATERA TEST RESULT: NEGATIVE

## 2022-09-08 NOTE — Therapy (Signed)
OUTPATIENT PHYSICAL THERAPY ONCOLOGY TREATMENT  Patient Name: Tracy Dyer MRN: [id number] DOB:Sep 10, 1970, 52 y.o., female Today's Date: 09/08/2022       Past Medical History:  Diagnosis Date   Anxiety    Cancer [facility])    breast   Depression    Family history of breast cancer    Family history of lung cancer    Family history of ovarian cancer    GERD (gastroesophageal reflux disease)    diet controlled   History of radiation therapy    Left breast, left axilla- 05/27/21-07/10/21- Dr. [name]   Seasonal allergies    Past Surgical History:  Procedure Laterality Date   BREAST BIOPSY Left 2019   x 2 biopsy   BREAST EXCISIONAL BIOPSY Left    BREAST LUMPECTOMY WITH RADIOACTIVE SEED AND SENTINEL LYMPH NODE BIOPSY Left 02/26/2021   Procedure: LEFT BREAST LUMPECTOMY WITH RADIOACTIVE SEED x2; LEFT AXILLARY SENTINEL NODE BIOPSY;  Surgeon: [name], MD;  Location: [place];  Service: General;  Laterality: Left;   BREAST LUMPECTOMY WITH RADIOFREQUENCY TAG IDENTIFICATION Left 03/05/2121   Procedure: LEFT BREAST MRI WIRE GUIDED EXCISION;  Surgeon: [name], MD;  Location: [place];  Service: General;  Laterality: Left;   BREAST REDUCTION SURGERY Bilateral 04/18/2021   Procedure: MAMMARY REDUCTION  (BREAST) LEFT ONCOPLASTIC BREAST RECONSTRUCTION, RIGHT BREAST REDUCTION;  Surgeon: Irene Limbo, MD;  Location: [place];  Service: Plastics;  Laterality: Bilateral;   CHOLECYSTECTOMY  2008   PORT-A-CATH REMOVAL Right 04/18/2021   Procedure: REMOVAL PORT-A-CATH;  Surgeon: [name], MD;  Location: [place];  Service: General;  Laterality: Right;   PORTACATH PLACEMENT N/A 09/05/2020   Procedure: INSERTION PORT-A-CATH;  Surgeon: [name], MD;  Location: [facility] OR;  Service: General;  Laterality: N/A;  [place] EXTRACTION     Patient Active Problem List   Diagnosis Date Noted    Occipital neuralgia of right side 07/01/2021   Peripheral vertigo 07/01/2021   Genetic testing 09/04/2020   Family history of breast cancer    Family history of ovarian cancer    Family history of lung cancer    Malignant neoplasm of upper-outer quadrant of left breast in female, estrogen receptor positive ([place]) 08/21/2020   Anxiety 11/16/2013   Hypercoagulable state ([place]) 11/16/2013   TIA (transient ischemic attack) 11/15/2013   Chest discomfort 01/20/2012   Factor V Leiden ([place]) 01/20/2012   MVP (mitral valve prolapse) 01/20/2012   Acute bronchitis 12/23/2011    PCP: [name], MD  REFERRING PROVIDER: [name], MD   REFERRING DIAG: [id number] (ICD-10-CM) - Malignant neoplasm of upper-outer quadrant of left breast in female, estrogen receptor positive ([place])  THERAPY DIAG:  No diagnosis found.  ONSET DATE: 03/03/22  Rationale for Evaluation and Treatment Rehabilitation  SUBJECTIVE  SUBJECTIVE STATEMENT: I really needed to get in. It is starting to swell more.   PERTINENT HISTORY:   Lt lumpectomy with SLNB 0/4 LN on 02/26/21, chemotherapy and radiation completed ; ER/PR+, HER2-.  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   PAIN:  Are you having pain? No NPRS scale: up to a 3/10  Pain location: left forearm  Pain orientation: Left  PAIN TYPE: discomfort Pain description: intermittent  Aggravating factors: trying to carry something, something tight on arm Relieving factors: nothing  PRECAUTIONS: Other: LUE lymphedema risk  WEIGHT BEARING RESTRICTIONS No  FALLS:  Has patient fallen in last 6 months? No  LIVING ENVIRONMENT: Lives with: lives with their family Lives in: House/apartment Has following equipment at home: None  OCCUPATION: full time, designer  LEISURE: pt is  walking 30 min/5 days per week, swimming  HAND DOMINANCE : right   PRIOR LEVEL OF FUNCTION: Independent  PATIENT GOALS  : decrease pain    OBJECTIVE  COGNITION:  Overall cognitive status: Within functional limits for tasks assessed   PALPATION: Cording from axilla to wrist by the thumb  OBSERVATIONS / OTHER ASSESSMENTS: no visible swelling  POSTURE: forward head and rounded shoulders  UPPER EXTREMITY AROM/PROM:  A/PROM RIGHT   eval    Shoulder extension 68   Shoulder flexion 160   Shoulder abduction 160   Shoulder internal rotation    Shoulder external rotation      (Blank rows = not tested)  A/PROM LEFT   eval 08/05/22  Shoulder extension 70   Shoulder flexion 166 143 - pull wrist to axilla   Shoulder abduction 173 170  Shoulder internal rotation    Shoulder external rotation      (Blank rows = not tested)  Pain with dequervains stretch position  50# grip strength bilaterally  Strength 4+/5   LYMPHEDEMA ASSESSMENTS:  SURGERY TYPE/DATE: Left lumpectomy and SLNB 02/26/21 NUMBER OF LYMPH NODES REMOVED: 0/4 CHEMOTHERAPY: completed RADIATION: completed HORMONE TREATMENT: had to stop due to side effects INFECTIONS: none reported   LYMPHEDEMA ASSESSMENTS:   LANDMARK RIGHT  eval  10 cm proximal to olecranon process 33.2  Olecranon process 26.8  10 cm proximal to ulnar styloid process 24  Just proximal to ulnar styloid process 16.5  Across hand at thumb web space 19.9  At base of 2nd digit 6.4  (Blank rows = not tested)  LANDMARK LEFT  eval  10 cm proximal to olecranon process 34.5  Olecranon process 26.7  10 cm proximal to ulnar styloid process 24.1  Just proximal to ulnar styloid process 16.3  Across hand at thumb web space 20  At base of 2nd digit 7  (Blank rows = not tested)   L-DEX FLOWSHEETS - 09/08/22 1200       L-DEX LYMPHEDEMA SCREENING   Measurement Type Unilateral    L-DEX MEASUREMENT EXTREMITY Upper Extremity    POSITION  Standing     DOMINANT SIDE Right    At Risk Side Left    BASELINE SCORE (UNILATERAL) -0.3    L-DEX SCORE (UNILATERAL) 7.7    VALUE CHANGE (UNILAT) 8            TODAY'S TREATMENT  Pt permission and consent throughout each step of examination and treatment with modification and draping if requested when working on sensitive areas  09/09/23 Redid SOZO and it has decreased to the yellow zone In supine: Short neck, 5 diaphragmatic breaths, superficial and deep abdominals, bil axillary nodes and establishment of  interaxillary pathway, L inguinal nodes and establishment of axilloinguinal pathway, then L UE working proximal to distal, moving fluid from upper inner arm outwards, and doing both sides of forearm moving fluid towards pathways spending extra time in any areas of fibrosis then retracing all steps while educating pt throughout and having her return demonstrate correct skin stretch technique Gentle cording release forearm and elbow but more focus on gentle MLD techniques today Compression bandaging: Applied tg soft from wrist to hand, fingers 1-4, artiflex from hand to axilla, 1 6cm hand bandage moving up forearm, and 1 8cm bandage from wrist to axilla with some herringbone at the widest forearm included.  Edu on stretching for cording   09/03/23 Redid SOZO which is now elevated by 16 in the red zone.  In supine: Short neck, 5 diaphragmatic breaths, superficial and deep abdominals, bil axillary nodes and establishment of interaxillary pathway, L inguinal nodes and establishment of axilloinguinal pathway, then L UE working proximal to distal, moving fluid from upper inner arm outwards, and doing both sides of forearm moving fluid towards pathways spending extra time in any areas of fibrosis then retracing all steps. PROM into all directions Gentle cording release forearm and elbow but more focus on gentle MLD techniques today Discussed bandaging for overnight with pt agreeable: Applied tg soft from  wrist to hand, fingers 1-4, artiflex from hand to axilla, 1 6cm hand bandage moving up forearm, and 1 8cm bandage from wrist to axilla with some herringbone at the widest forearm included.  Education on wearing bandage overnight and as long as she can into the next day and then switching back into her regular sleeves and pt knows to remove if uncomfortable or painful.  Edu on remedial exercises Showed pt link to MD [place] video and gave her extra 8cm bandage as 10cm was out if she wanted to try another layer.   08/27/22 In supine: Short neck, 5 diaphragmatic breaths, superficial and deep abdominals, bil axillary nodes and establishment of interaxillary pathway, L inguinal nodes and establishment of axilloinguinal pathway, then L UE working proximal to distal, moving fluid from upper inner arm outwards, and doing both sides of forearm moving fluid towards pathways spending extra time in any areas of fibrosis then retracing all steps. PROM into all directions  08/25/22 Manual Therapy MFR in supine to cording MLD review in seated x 47mn: Performed full sequence and began instructing pt while performing due to her change from baseline with SOZO screen still being elevated, more so than 1 month ago: Short neck, 5 diaphragmatic breaths, Lt inguinal and Rt axillary nodes, Lt axillo-inguinal and anterior inter-axillary anastomosis, then focused on Lt UE working from distal to proximal then reacting all steps per handout (see below) that was issued as well. Had pt return some of steps to assess her pressure and skin stretch vs slide. Handover hand technique used for this.  08/19/22: Redid SOZO and her change from baseline os slightly more elevated than last time with a score of 8. Pt reports she wasn't 100% compliant with wear of her compression garments so will work on this for next 30 days.  Therapeutic Exercises Pulleys into flexion and abduction x2 mins each with VC's to remind pt to relax  shoulders Modified downward dog on wall 5x, 5 sec holds returning therapist demo  Manual Therapy MFR in supine to cording MLD: Performed full sequence and began instructing pt while performing due to her change from baseline with SOZO screen still being elevated, more  so than 1 month ago: Short neck, 5 diaphragmatic breaths, Lt inguinal and Rt axillary nodes, Lt axillo-inguinal and anterior inter-axillary anastomosis, then focused on Lt UE working from distal to proximal then reacting all steps per handout (see below) that was issued as well. Had pt return some of steps to assess her pressure and skin stretch vs slide. Handover hand technique used for this.   08/17/22 Pulleys into flexion and abduction x 42mn each Wall single arm chest stretch 2x20" STM/MFR following cording from axilla to wrist with PROM and pinning included PROM to tolerance MLD following cording work for [employer]  PATIENT EDUCATION:  Education details: Self MLD, compression bandaging basics Person educated: Patient Education method: EConsulting civil engineer DMedia planner handout issued Education comprehension: verbalized understanding, returned demonstration and will benefit from further review  HOME EXERCISE PROGRAM: Wear compression sleeve - stretches for cording and add daily self MLD  ASSESSMENT: CLINICAL IMPRESSION: Pt still has numerous cords present throughout LUE. She has been wearing her sleeve and doing self MLD. When she demonstrated technique today she was using too much pressure so was educated to only use enough pressure to stretch the skin. Redid ldex screening today and she is now back down to the yellow. Reapplied bandages and pt ordered a new sleeve and a glove instead of a gauntlet since her fingers have been swelling.   OBJECTIVE IMPAIRMENTS decreased knowledge of use of DME, increased edema, increased fascial restrictions, postural dysfunction, and pain.   ACTIVITY LIMITATIONS  none  PARTICIPATION LIMITATIONS:   none  PERSONAL FACTORS  none  are also affecting patient's functional outcome.   REHAB POTENTIAL: Excellent  CLINICAL DECISION MAKING: Stable/uncomplicated  EVALUATION COMPLEXITY: Low  GOALS: Goals reviewed with patient? Yes  SHORT TERM GOALS=LONG TERM GOALS  Target date: 09/30/22  Pt will return Lt UE AROM to prior levels to improve mobility  Baseline: Goal status: MET  2.  Pt will report no limitations in ADLs from cording discomfort Baseline:  Goal status: ONGOING  3. Pt will be ind with self MLD and self bandaging for lymphedema management  NEW  PLAN: PT FREQUENCY: 2x/week  PT DURATION: 4 weeks  PLANNED INTERVENTIONS: Therapeutic exercises, Therapeutic activity, Patient/Family education, Self Care, Joint mobilization, Orthotic/Fit training, Manual lymph drainage, Compression bandaging, scar mobilization, Taping, and Manual therapy  PLAN FOR NEXT SESSION: How was bandaging? Did pt try? Self bandaging education - get circumferences due to new swelling.  (Cont as needed MFR to cording in L UE, MLD to Lt UE-cont this and review her technique * Repeat SOZO around 10/29/22)   BManus Gunning PT 09/08/2022, 12:18 PM    Start with circles near neck above collarbones, 5 times each side.    Cancer Rehab [phone] Deep Effective Breath   Standing, sitting, or laying down, place both hands on the belly. Take a deep breath IN, expanding the belly; then breath OUT, contracting the belly. Repeat __5__ times. Do __2-3__ sessions per day and before your self massage.  Axilla to Axilla - Sweep   On uninvolved side make 5 circles in the armpit, then pump _5__ times from involved armpit across chest to uninvolved armpit, making a pathway. Do _1__ time per day.  Copyright  VHI. All rights reserved.  Axilla to Inguinal Nodes - Sweep   On involved side, make 5 circles at groin at panty line, then pump _5__ times from armpit along side of trunk to outer hip, making your  other pathway. Do __1_ time per day.  Copyright  VHI. All rights reserved.  Arm Posterior: Elbow to Shoulder - Sweep   Pump _5__ times from back of elbow to top of shoulder. Then inner to outer upper arm _5_ times, then outer arm again _5_ times. Then back to the pathways _2-3_ times. Do _1__ time per day.  Copyright  VHI. All rights reserved.  ARM: [place] Wrist to Elbow - Sweep   Pump or stationary circles _5__ times from wrist to elbow making sure to do both sides of the forearm. Then retrace your steps to the outer arm, and the pathways _2-3_ times each. Do _1__ time per day.  Copyright  VHI. All rights reserved.  ARM: Dorsum of Hand to Shoulder - Sweep   Pump or stationary circles _5__ times on back of hand including knuckle spaces and individual fingers if needed working up towards the wrist, then retrace all your steps working back up the forearm, doing both sides; upper outer arm and back to your pathways _2-3_ times each. Then do 5 circles again at uninvolved armpit and involved groin where you started! Good job!! Do __1_ time per day.

## 2022-09-09 ENCOUNTER — Telehealth: Payer: Self-pay | Admitting: *Deleted

## 2022-09-09 NOTE — Telephone Encounter (Signed)
Per MD request, RN placed call to pt regarding negative (Not Detected) recent [employer] testing.  No answer, LVM for pt to return call to the office.  

## 2022-09-10 ENCOUNTER — Encounter: Payer: Self-pay | Admitting: Physical Therapy

## 2022-09-10 ENCOUNTER — Ambulatory Visit: Payer: BC Managed Care – PPO | Admitting: Physical Therapy

## 2022-09-10 DIAGNOSIS — Z170 Estrogen receptor positive status [ER+]: Secondary | ICD-10-CM

## 2022-09-10 DIAGNOSIS — C50412 Malignant neoplasm of upper-outer quadrant of left female breast: Secondary | ICD-10-CM | POA: Diagnosis not present

## 2022-09-10 DIAGNOSIS — Z483 Aftercare following surgery for neoplasm: Secondary | ICD-10-CM | POA: Diagnosis not present

## 2022-09-10 DIAGNOSIS — R600 Localized edema: Secondary | ICD-10-CM | POA: Diagnosis not present

## 2022-09-10 DIAGNOSIS — R293 Abnormal posture: Secondary | ICD-10-CM | POA: Diagnosis not present

## 2022-09-10 DIAGNOSIS — I890 Lymphedema, not elsewhere classified: Secondary | ICD-10-CM | POA: Diagnosis not present

## 2022-09-10 NOTE — Therapy (Signed)
OUTPATIENT PHYSICAL THERAPY ONCOLOGY TREATMENT  Patient Name: Tracy Dyer MRN: [id number] DOB:October 30, 1970, 52 y.o., female Today's Date: 09/10/2022   PT End of Session - 09/10/22 0806     Visit Number 14    Number of Visits 20    Date for PT Re-Evaluation 09/30/22    PT Start Time 0805    PT Stop Time 3220    PT Time Calculation (min) 50 min    Activity Tolerance Patient tolerated treatment well    Behavior During Therapy [facility] for tasks assessed/performed                Past Medical History:  Diagnosis Date   Anxiety    Cancer ([place])    breast   Depression    Family history of breast cancer    Family history of lung cancer    Family history of ovarian cancer    GERD (gastroesophageal reflux disease)    diet controlled   History of radiation therapy    Left breast, left axilla- 05/27/21-07/10/21- Dr. [name]   Seasonal allergies    Past Surgical History:  Procedure Laterality Date   BREAST BIOPSY Left 2019   x 2 biopsy   BREAST EXCISIONAL BIOPSY Left    BREAST LUMPECTOMY WITH RADIOACTIVE SEED AND SENTINEL LYMPH NODE BIOPSY Left 02/26/2021   Procedure: LEFT BREAST LUMPECTOMY WITH RADIOACTIVE SEED x2; LEFT AXILLARY SENTINEL NODE BIOPSY;  Surgeon: [name], MD;  Location: [place];  Service: General;  Laterality: Left;   BREAST LUMPECTOMY WITH RADIOFREQUENCY TAG IDENTIFICATION Left 2/54/2706   Procedure: LEFT BREAST MRI WIRE GUIDED EXCISION;  Surgeon: [name], MD;  Location: [place];  Service: General;  Laterality: Left;   BREAST REDUCTION SURGERY Bilateral 04/18/2021   Procedure: MAMMARY REDUCTION  (BREAST) LEFT ONCOPLASTIC BREAST RECONSTRUCTION, RIGHT BREAST REDUCTION;  Surgeon: Irene Limbo, MD;  Location: [place];  Service: Plastics;  Laterality: Bilateral;   CHOLECYSTECTOMY  2008   PORT-A-CATH REMOVAL Right 04/18/2021   Procedure: REMOVAL PORT-A-CATH;  Surgeon: [name], MD;   Location: [place];  Service: General;  Laterality: Right;   PORTACATH PLACEMENT N/A 09/05/2020   Procedure: INSERTION PORT-A-CATH;  Surgeon: [name], MD;  Location: [facility] OR;  Service: General;  Laterality: N/A;  [place] EXTRACTION     Patient Active Problem List   Diagnosis Date Noted   Occipital neuralgia of right side 07/01/2021   Peripheral vertigo 07/01/2021   Genetic testing 09/04/2020   Family history of breast cancer    Family history of ovarian cancer    Family history of lung cancer    Malignant neoplasm of upper-outer quadrant of left breast in female, estrogen receptor positive ([place]) 08/21/2020   Anxiety 11/16/2013   Hypercoagulable state ([place]) 11/16/2013   TIA (transient ischemic attack) 11/15/2013   Chest discomfort 01/20/2012   Factor V Leiden ([place]) 01/20/2012   MVP (mitral valve prolapse) 01/20/2012   Acute bronchitis 12/23/2011    PCP: [name], MD  REFERRING PROVIDER: [name], MD   REFERRING DIAG: [phone] (ICD-10-CM) - Malignant neoplasm of upper-outer quadrant of left breast in female, estrogen receptor positive ([place])  THERAPY DIAG:  Lymphedema, not elsewhere classified  Aftercare following surgery for neoplasm  Malignant neoplasm of upper-outer quadrant of left breast in female, estrogen receptor positive ([place])  ONSET DATE: 03/03/22  Rationale for Evaluation and Treatment Rehabilitation  SUBJECTIVE  SUBJECTIVE STATEMENT: The bandages helped with the swelling. I am having more cording on both sides of my wrist. I have been stretching it.   PERTINENT HISTORY:   Lt lumpectomy with SLNB 0/4 LN on 02/26/21, chemotherapy and radiation completed ; ER/PR+, HER2-.  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   PAIN:  Are you  having pain? No NPRS scale: up to a 4/10  Pain location: left forearm  Pain orientation: Left  PAIN TYPE:nagging, ache, pressure Pain description: intermittent  Aggravating factors: trying to carry something, something tight on arm Relieving factors: nothing  PRECAUTIONS: Other: LUE lymphedema risk  WEIGHT BEARING RESTRICTIONS No  FALLS:  Has patient fallen in last 6 months? No  LIVING ENVIRONMENT: Lives with: lives with their family Lives in: House/apartment Has following equipment at home: None  OCCUPATION: full time, designer  LEISURE: pt is walking 30 min/5 days per week, swimming  HAND DOMINANCE : right   PRIOR LEVEL OF FUNCTION: Independent  PATIENT GOALS  : decrease pain    OBJECTIVE  COGNITION:  Overall cognitive status: Within functional limits for tasks assessed   PALPATION: Cording from axilla to wrist by the thumb  OBSERVATIONS / OTHER ASSESSMENTS: no visible swelling  POSTURE: forward head and rounded shoulders  UPPER EXTREMITY AROM/PROM:  A/PROM RIGHT   eval    Shoulder extension 68   Shoulder flexion 160   Shoulder abduction 160   Shoulder internal rotation    Shoulder external rotation      (Blank rows = not tested)  A/PROM LEFT   eval 08/05/22  Shoulder extension 70   Shoulder flexion 166 143 - pull wrist to axilla   Shoulder abduction 173 170  Shoulder internal rotation    Shoulder external rotation      (Blank rows = not tested)  Pain with dequervains stretch position  50# grip strength bilaterally  Strength 4+/5   LYMPHEDEMA ASSESSMENTS:  SURGERY TYPE/DATE: Left lumpectomy and SLNB 02/26/21 NUMBER OF LYMPH NODES REMOVED: 0/4 CHEMOTHERAPY: completed RADIATION: completed HORMONE TREATMENT: had to stop due to side effects INFECTIONS: none reported   LYMPHEDEMA ASSESSMENTS:   LANDMARK RIGHT  eval  10 cm proximal to olecranon process 33.2  Olecranon process 26.8  10 cm proximal to ulnar styloid process 24  Just proximal  to ulnar styloid process 16.5  Across hand at thumb web space 19.9  At base of 2nd digit 6.4  (Blank rows = not tested)  LANDMARK LEFT  eval  10 cm proximal to olecranon process 34.5  Olecranon process 26.7  10 cm proximal to ulnar styloid process 24.1  Just proximal to ulnar styloid process 16.3  Across hand at thumb web space 20  At base of 2nd digit 7  (Blank rows = not tested)    TODAY'S TREATMENT  Pt permission and consent throughout each step of examination and treatment with modification and draping if requested when working on sensitive areas  09/10/22 Spent entire session on gentle MFR release to cording from antecubital fossa extending to wrist with majority of time spent at medial and lateral side of wrist where cording is the worst and most tender  09/09/23 Redid SOZO and it has decreased to the yellow zone In supine: Short neck, 5 diaphragmatic breaths, superficial and deep abdominals, bil axillary nodes and establishment of interaxillary pathway, L inguinal nodes and establishment of axilloinguinal pathway, then L UE working proximal to distal, moving fluid from upper inner arm outwards, and doing both sides of  forearm moving fluid towards pathways spending extra time in any areas of fibrosis then retracing all steps while educating pt throughout and having her return demonstrate correct skin stretch technique Gentle cording release forearm and elbow but more focus on gentle MLD techniques today Compression bandaging: Applied tg soft from wrist to hand, fingers 1-4, artiflex from hand to axilla, 1 6cm hand bandage moving up forearm, and 1 8cm bandage from wrist to axilla with some herringbone at the widest forearm included.  Edu on stretching for cording   09/03/23 Redid SOZO which is now elevated by 16 in the red zone.  In supine: Short neck, 5 diaphragmatic breaths, superficial and deep abdominals, bil axillary nodes and establishment of interaxillary pathway, L inguinal  nodes and establishment of axilloinguinal pathway, then L UE working proximal to distal, moving fluid from upper inner arm outwards, and doing both sides of forearm moving fluid towards pathways spending extra time in any areas of fibrosis then retracing all steps. PROM into all directions Gentle cording release forearm and elbow but more focus on gentle MLD techniques today Discussed bandaging for overnight with pt agreeable: Applied tg soft from wrist to hand, fingers 1-4, artiflex from hand to axilla, 1 6cm hand bandage moving up forearm, and 1 8cm bandage from wrist to axilla with some herringbone at the widest forearm included.  Education on wearing bandage overnight and as long as she can into the next day and then switching back into her regular sleeves and pt knows to remove if uncomfortable or painful.  Edu on remedial exercises Showed pt link to MD [place] video and gave her extra 8cm bandage as 10cm was out if she wanted to try another layer.   08/27/22 In supine: Short neck, 5 diaphragmatic breaths, superficial and deep abdominals, bil axillary nodes and establishment of interaxillary pathway, L inguinal nodes and establishment of axilloinguinal pathway, then L UE working proximal to distal, moving fluid from upper inner arm outwards, and doing both sides of forearm moving fluid towards pathways spending extra time in any areas of fibrosis then retracing all steps. PROM into all directions  08/25/22 Manual Therapy MFR in supine to cording MLD review in seated x 77mn: Performed full sequence and began instructing pt while performing due to her change from baseline with SOZO screen still being elevated, more so than 1 month ago: Short neck, 5 diaphragmatic breaths, Lt inguinal and Rt axillary nodes, Lt axillo-inguinal and anterior inter-axillary anastomosis, then focused on Lt UE working from distal to proximal then reacting all steps per handout (see below) that was issued as well. Had pt  return some of steps to assess her pressure and skin stretch vs slide. Handover hand technique used for this.  08/19/22: Redid SOZO and her change from baseline os slightly more elevated than last time with a score of 8. Pt reports she wasn't 100% compliant with wear of her compression garments so will work on this for next 30 days.  Therapeutic Exercises Pulleys into flexion and abduction x2 mins each with VC's to remind pt to relax shoulders Modified downward dog on wall 5x, 5 sec holds returning therapist demo  Manual Therapy MFR in supine to cording MLD: Performed full sequence and began instructing pt while performing due to her change from baseline with SOZO screen still being elevated, more so than 1 month ago: Short neck, 5 diaphragmatic breaths, Lt inguinal and Rt axillary nodes, Lt axillo-inguinal and anterior inter-axillary anastomosis, then focused on Lt UE working from  distal to proximal then reacting all steps per handout (see below) that was issued as well. Had pt return some of steps to assess her pressure and skin stretch vs slide. Handover hand technique used for this.   08/17/22 Pulleys into flexion and abduction x 1mn each Wall single arm chest stretch 2x20" STM/MFR following cording from axilla to wrist with PROM and pinning included PROM to tolerance MLD following cording work for [employer]  PATIENT EDUCATION:  Education details: Self MLD, compression bandaging basics Person educated: Patient Education method: EConsulting civil engineer DMedia planner handout issued Education comprehension: verbalized understanding, returned demonstration and will benefit from further review  HOME EXERCISE PROGRAM: Wear compression sleeve - stretches for cording and add daily self MLD  ASSESSMENT: CLINICAL IMPRESSION: Pt left her bandages at home today so was unable to apply at end of session. Her cording is really bothering her and causing increased discomfort and edema. It is worst at her wrist on  medial and lateral aspect so spent session today working to decrease tightness with MFR. Pt felt improvements by end of session.   OBJECTIVE IMPAIRMENTS decreased knowledge of use of DME, increased edema, increased fascial restrictions, postural dysfunction, and pain.   ACTIVITY LIMITATIONS  none  PARTICIPATION LIMITATIONS:  none  PERSONAL FACTORS  none  are also affecting patient's functional outcome.   REHAB POTENTIAL: Excellent  CLINICAL DECISION MAKING: Stable/uncomplicated  EVALUATION COMPLEXITY: Low  GOALS: Goals reviewed with patient? Yes  SHORT TERM GOALS=LONG TERM GOALS  Target date: 09/30/22  Pt will return Lt UE AROM to prior levels to improve mobility  Baseline: Goal status: MET  2.  Pt will report no limitations in ADLs from cording discomfort Baseline:  Goal status: ONGOING  3. Pt will be ind with self MLD and self bandaging for lymphedema management  NEW  PLAN: PT FREQUENCY: 2x/week  PT DURATION: 4 weeks  PLANNED INTERVENTIONS: Therapeutic exercises, Therapeutic activity, Patient/Family education, Self Care, Joint mobilization, Orthotic/Fit training, Manual lymph drainage, Compression bandaging, scar mobilization, Taping, and Manual therapy  PLAN FOR NEXT SESSION: How was bandaging? Did pt try? Self bandaging education - get circumferences due to new swelling.  (Cont as needed MFR to cording in L UE, MLD to Lt UE-cont this and review her technique * Repeat SOZO around 10/29/22)   BManus Gunning PT 09/10/2022, 9:03 AM    Start with circles near neck above collarbones, 5 times each side.    Cancer Rehab [id number] Deep Effective Breath   Standing, sitting, or laying down, place both hands on the belly. Take a deep breath IN, expanding the belly; then breath OUT, contracting the belly. Repeat __5__ times. Do __2-3__ sessions per day and before your self massage.  Axilla to Axilla - Sweep   On uninvolved side make 5 circles in the armpit, then  pump _5__ times from involved armpit across chest to uninvolved armpit, making a pathway. Do _1__ time per day.  Copyright  VHI. All rights reserved.  Axilla to Inguinal Nodes - Sweep   On involved side, make 5 circles at groin at panty line, then pump _5__ times from armpit along side of trunk to outer hip, making your other pathway. Do __1_ time per day.  Copyright  VHI. All rights reserved.  Arm Posterior: Elbow to Shoulder - Sweep   Pump _5__ times from back of elbow to top of shoulder. Then inner to outer upper arm _5_ times, then outer arm again _5_ times. Then back to the pathways _2-3_ times.  Do _1__ time per day.  Copyright  VHI. All rights reserved.  ARM: [place] Wrist to Elbow - Sweep   Pump or stationary circles _5__ times from wrist to elbow making sure to do both sides of the forearm. Then retrace your steps to the outer arm, and the pathways _2-3_ times each. Do _1__ time per day.  Copyright  VHI. All rights reserved.  ARM: Dorsum of Hand to Shoulder - Sweep   Pump or stationary circles _5__ times on back of hand including knuckle spaces and individual fingers if needed working up towards the wrist, then retrace all your steps working back up the forearm, doing both sides; upper outer arm and back to your pathways _2-3_ times each. Then do 5 circles again at uninvolved armpit and involved groin where you started! Good job!! Do __1_ time per day.

## 2022-09-16 ENCOUNTER — Encounter: Payer: Self-pay | Admitting: Rehabilitation

## 2022-09-16 ENCOUNTER — Ambulatory Visit: Payer: BC Managed Care – PPO | Admitting: Rehabilitation

## 2022-09-16 ENCOUNTER — Other Ambulatory Visit (HOSPITAL_BASED_OUTPATIENT_CLINIC_OR_DEPARTMENT_OTHER): Payer: Self-pay

## 2022-09-16 DIAGNOSIS — I890 Lymphedema, not elsewhere classified: Secondary | ICD-10-CM | POA: Diagnosis not present

## 2022-09-16 DIAGNOSIS — R600 Localized edema: Secondary | ICD-10-CM

## 2022-09-16 DIAGNOSIS — Z170 Estrogen receptor positive status [ER+]: Secondary | ICD-10-CM

## 2022-09-16 DIAGNOSIS — R293 Abnormal posture: Secondary | ICD-10-CM

## 2022-09-16 DIAGNOSIS — C50412 Malignant neoplasm of upper-outer quadrant of left female breast: Secondary | ICD-10-CM | POA: Diagnosis not present

## 2022-09-16 DIAGNOSIS — Z483 Aftercare following surgery for neoplasm: Secondary | ICD-10-CM

## 2022-09-16 MED ORDER — ZEPBOUND 7.5 MG/0.5ML ~~LOC~~ SOAJ
SUBCUTANEOUS | 0 refills | Status: DC
  Filled 2022-09-16: qty 2, 28d supply, fill #0

## 2022-09-16 NOTE — Therapy (Signed)
OUTPATIENT PHYSICAL THERAPY ONCOLOGY TREATMENT  Patient Name: Tracy Dyer MRN: [id number] DOB:June 22, 1971, 52 y.o., female Today's Date: 09/16/2022   PT End of Session - 09/16/22 1604     Visit Number 15    Number of Visits 20    Date for PT Re-Evaluation 09/30/22    PT Start Time 1605    PT Stop Time 1700    PT Time Calculation (min) 55 min    Activity Tolerance Patient tolerated treatment well    Behavior During Therapy WFL for tasks assessed/performed                Past Medical History:  Diagnosis Date   Anxiety    Cancer ([place])    breast   Depression    Family history of breast cancer    Family history of lung cancer    Family history of ovarian cancer    GERD (gastroesophageal reflux disease)    diet controlled   History of radiation therapy    Left breast, left axilla- 05/27/21-07/10/21- Dr. [name]   Seasonal allergies    Past Surgical History:  Procedure Laterality Date   BREAST BIOPSY Left 2019   x 2 biopsy   BREAST EXCISIONAL BIOPSY Left    BREAST LUMPECTOMY WITH RADIOACTIVE SEED AND SENTINEL LYMPH NODE BIOPSY Left 02/26/2021   Procedure: LEFT BREAST LUMPECTOMY WITH RADIOACTIVE SEED x2; LEFT AXILLARY SENTINEL NODE BIOPSY;  Surgeon: [name], MD;  Location: [facility];  Service: General;  Laterality: Left;   BREAST LUMPECTOMY WITH RADIOFREQUENCY TAG IDENTIFICATION Left [id number]   Procedure: LEFT BREAST MRI WIRE GUIDED EXCISION;  Surgeon: [name], MD;  Location: [facility];  Service: General;  Laterality: Left;   BREAST REDUCTION SURGERY Bilateral 04/18/2021   Procedure: MAMMARY REDUCTION  (BREAST) LEFT ONCOPLASTIC BREAST RECONSTRUCTION, RIGHT BREAST REDUCTION;  Surgeon: Irene Limbo, MD;  Location: [facility];  Service: Plastics;  Laterality: Bilateral;   CHOLECYSTECTOMY  2008   PORT-A-CATH REMOVAL Right 04/18/2021   Procedure: REMOVAL PORT-A-CATH;  Surgeon: [name],  MD;  Location: [facility];  Service: General;  Laterality: Right;   PORTACATH PLACEMENT N/A 09/05/2020   Procedure: INSERTION PORT-A-CATH;  Surgeon: [name], MD;  Location[facility];  Service: General;  Laterality: N/A;  [place] EXTRACTION     Patient Active Problem List   Diagnosis Date Noted   Occipital neuralgia of right side 07/01/2021   Peripheral vertigo 07/01/2021   Genetic testing 09/04/2020   Family history of breast cancer    Family history of ovarian cancer    Family history of lung cancer    Malignant neoplasm of upper-outer quadrant of left breast in female, estrogen receptor positive ([place]) 08/21/2020   Anxiety 11/16/2013   Hypercoagulable state ([place]) 11/16/2013   TIA (transient ischemic attack) 11/15/2013   Chest discomfort 01/20/2012   Factor V Leiden ([place]) 01/20/2012   MVP (mitral valve prolapse) 01/20/2012   Acute bronchitis 12/23/2011    PCP: [name], MD  REFERRING PROVIDER: [name], MD   REFERRING DIAG: [phone] (ICD-10-CM) - Malignant neoplasm of upper-outer quadrant of left breast in female, estrogen receptor positive ([place])  THERAPY DIAG:  Lymphedema, not elsewhere classified  Aftercare following surgery for neoplasm  Malignant neoplasm of upper-outer quadrant of left breast in female, estrogen receptor positive ([place])  Abnormal posture  Localized edema  ONSET DATE: 03/03/22  Rationale for Evaluation and Treatment Rehabilitation  SUBJECTIVE  SUBJECTIVE STATEMENT: It is better than last time I saw you.   PERTINENT HISTORY:   Lt lumpectomy with SLNB 0/4 LN on 02/26/21, chemotherapy and radiation completed ; ER/PR+, HER2-.  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   PAIN:  Are you having pain? No NPRS scale: up to a 4/10   Pain location: left forearm  Pain orientation: Left  PAIN TYPE:nagging, ache, pressure Pain description: intermittent  Aggravating factors: trying to carry something, something tight on arm Relieving factors: nothing  PRECAUTIONS: Other: LUE lymphedema risk  WEIGHT BEARING RESTRICTIONS No  FALLS:  Has patient fallen in last 6 months? No  LIVING ENVIRONMENT: Lives with: lives with their family Lives in: House/apartment Has following equipment at home: None  OCCUPATION: full time, designer  LEISURE: pt is walking 30 min/5 days per week, swimming  HAND DOMINANCE : right   PRIOR LEVEL OF FUNCTION: Independent  PATIENT GOALS  : decrease pain    OBJECTIVE  COGNITION:  Overall cognitive status: Within functional limits for tasks assessed   PALPATION: Cording from axilla to wrist by the thumb  OBSERVATIONS / OTHER ASSESSMENTS: no visible swelling  POSTURE: forward head and rounded shoulders  UPPER EXTREMITY AROM/PROM:  A/PROM RIGHT   eval    Shoulder extension 68   Shoulder flexion 160   Shoulder abduction 160   Shoulder internal rotation    Shoulder external rotation      (Blank rows = not tested)  A/PROM LEFT   eval 08/05/22  Shoulder extension 70   Shoulder flexion 166 143 - pull wrist to axilla   Shoulder abduction 173 170  Shoulder internal rotation    Shoulder external rotation      (Blank rows = not tested)  Pain with dequervains stretch position  50# grip strength bilaterally  Strength 4+/5   LYMPHEDEMA ASSESSMENTS:  SURGERY TYPE/DATE: Left lumpectomy and SLNB 02/26/21 NUMBER OF LYMPH NODES REMOVED: 0/4 CHEMOTHERAPY: completed RADIATION: completed HORMONE TREATMENT: had to stop due to side effects INFECTIONS: none reported   LYMPHEDEMA ASSESSMENTS:   LANDMARK RIGHT  eval  10 cm proximal to olecranon process 33.2  Olecranon process 26.8  10 cm proximal to ulnar styloid process 24  Just proximal to ulnar styloid process 16.5  Across  hand at thumb web space 19.9  At base of 2nd digit 6.4  (Blank rows = not tested)  LANDMARK LEFT  eval  10 cm proximal to olecranon process 34.5  Olecranon process 26.7  10 cm proximal to ulnar styloid process 24.1  Just proximal to ulnar styloid process 16.3  Across hand at thumb web space 20  At base of 2nd digit 7  (Blank rows = not tested)    TODAY'S TREATMENT  Pt permission and consent throughout each step of examination and treatment with modification and draping if requested when working on sensitive areas  09/16/22 MFR release to cording from antecubital fossa extending to wrist with majority of time spent at medial and lateral side of wrist where cording is the worst and most tender In supine: Short neck, 5 diaphragmatic breaths, superficial and deep abdominals, bil axillary nodes and establishment of interaxillary pathway, L inguinal nodes and establishment of axilloinguinal pathway, then L UE working proximal to distal, moving fluid from upper inner arm outwards, and doing both sides of forearm moving fluid towards pathways spending extra time in any areas of fibrosis then retracing all steps   09/10/22 Spent entire session on gentle MFR release to cording from  antecubital fossa extending to wrist with majority of time spent at medial and lateral side of wrist where cording is the worst and most tender  09/09/23 Redid SOZO and it has decreased to the yellow zone In supine: Short neck, 5 diaphragmatic breaths, superficial and deep abdominals, bil axillary nodes and establishment of interaxillary pathway, L inguinal nodes and establishment of axilloinguinal pathway, then L UE working proximal to distal, moving fluid from upper inner arm outwards, and doing both sides of forearm moving fluid towards pathways spending extra time in any areas of fibrosis then retracing all steps while educating pt throughout and having her return demonstrate correct skin stretch technique Gentle cording  release forearm and elbow but more focus on gentle MLD techniques today Compression bandaging: Applied tg soft from wrist to hand, fingers 1-4, artiflex from hand to axilla, 1 6cm hand bandage moving up forearm, and 1 8cm bandage from wrist to axilla with some herringbone at the widest forearm included.  Edu on stretching for cording   PATIENT EDUCATION:  Education details: Self MLD, compression bandaging basics Person educated: Patient Education method: Explanation, Demonstration, handout issued Education comprehension: verbalized understanding, returned demonstration and will benefit from further review  HOME EXERCISE PROGRAM: Wear compression sleeve - stretches for cording and add daily self MLD  ASSESSMENT: CLINICAL IMPRESSION: Worked on cording today with MLD incorporated.  Cording in focused from axilla down to base of thumb and even towards the 2nd finger.   OBJECTIVE IMPAIRMENTS decreased knowledge of use of DME, increased edema, increased fascial restrictions, postural dysfunction, and pain.   ACTIVITY LIMITATIONS  none  PARTICIPATION LIMITATIONS:  none  PERSONAL FACTORS  none  are also affecting patient's functional outcome.   REHAB POTENTIAL: Excellent  CLINICAL DECISION MAKING: Stable/uncomplicated  EVALUATION COMPLEXITY: Low  GOALS: Goals reviewed with patient? Yes  SHORT TERM GOALS=LONG TERM GOALS  Target date: 09/30/22  Pt will return Lt UE AROM to prior levels to improve mobility  Baseline: Goal status: MET  2.  Pt will report no limitations in ADLs from cording discomfort Baseline:  Goal status: ONGOING  3. Pt will be ind with self MLD and self bandaging for lymphedema management  NEW  PLAN: PT FREQUENCY: 2x/week  PT DURATION: 4 weeks  PLANNED INTERVENTIONS: Therapeutic exercises, Therapeutic activity, Patient/Family education, Self Care, Joint mobilization, Orthotic/Fit training, Manual lymph drainage, Compression bandaging, scar mobilization,  Taping, and Manual therapy  PLAN FOR NEXT SESSION: How was bandaging? Did pt try? Self bandaging education - get circumferences due to new swelling.  (Cont as needed MFR to cording in L UE, MLD to Lt UE-cont this and review her technique * Repeat SOZO around 10/29/22)   Stark Bray, PT 09/16/2022, 5:08 PM    Start with circles near neck above collarbones, 5 times each side.    Cancer Rehab [phone] Deep Effective Breath   Standing, sitting, or laying down, place both hands on the belly. Take a deep breath IN, expanding the belly; then breath OUT, contracting the belly. Repeat __5__ times. Do __2-3__ sessions per day and before your self massage.  Axilla to Axilla - Sweep   On uninvolved side make 5 circles in the armpit, then pump _5__ times from involved armpit across chest to uninvolved armpit, making a pathway. Do _1__ time per day.  Copyright  VHI. All rights reserved.  Axilla to Inguinal Nodes - Sweep   On involved side, make 5 circles at groin at panty line, then pump _5__ times from armpit  along side of trunk to outer hip, making your other pathway. Do __1_ time per day.  Copyright  VHI. All rights reserved.  Arm Posterior: Elbow to Shoulder - Sweep   Pump _5__ times from back of elbow to top of shoulder. Then inner to outer upper arm _5_ times, then outer arm again _5_ times. Then back to the pathways _2-3_ times. Do _1__ time per day.  Copyright  VHI. All rights reserved.  ARM: [place] Wrist to Elbow - Sweep   Pump or stationary circles _5__ times from wrist to elbow making sure to do both sides of the forearm. Then retrace your steps to the outer arm, and the pathways _2-3_ times each. Do _1__ time per day.  Copyright  VHI. All rights reserved.  ARM: Dorsum of Hand to Shoulder - Sweep   Pump or stationary circles _5__ times on back of hand including knuckle spaces and individual fingers if needed working up towards the wrist, then retrace all your steps  working back up the forearm, doing both sides; upper outer arm and back to your pathways _2-3_ times each. Then do 5 circles again at uninvolved armpit and involved groin where you started! Good job!! Do __1_ time per day.

## 2022-09-21 DIAGNOSIS — M9902 Segmental and somatic dysfunction of thoracic region: Secondary | ICD-10-CM | POA: Diagnosis not present

## 2022-09-21 DIAGNOSIS — M7541 Impingement syndrome of right shoulder: Secondary | ICD-10-CM | POA: Diagnosis not present

## 2022-09-21 DIAGNOSIS — M9901 Segmental and somatic dysfunction of cervical region: Secondary | ICD-10-CM | POA: Diagnosis not present

## 2022-09-21 DIAGNOSIS — M9903 Segmental and somatic dysfunction of lumbar region: Secondary | ICD-10-CM | POA: Diagnosis not present

## 2022-09-21 DIAGNOSIS — M9907 Segmental and somatic dysfunction of upper extremity: Secondary | ICD-10-CM | POA: Diagnosis not present

## 2022-09-26 DIAGNOSIS — M9902 Segmental and somatic dysfunction of thoracic region: Secondary | ICD-10-CM | POA: Diagnosis not present

## 2022-09-26 DIAGNOSIS — M9901 Segmental and somatic dysfunction of cervical region: Secondary | ICD-10-CM | POA: Diagnosis not present

## 2022-09-26 DIAGNOSIS — M9907 Segmental and somatic dysfunction of upper extremity: Secondary | ICD-10-CM | POA: Diagnosis not present

## 2022-09-26 DIAGNOSIS — M7541 Impingement syndrome of right shoulder: Secondary | ICD-10-CM | POA: Diagnosis not present

## 2022-10-02 ENCOUNTER — Ambulatory Visit: Payer: BC Managed Care – PPO | Attending: Hematology and Oncology

## 2022-10-02 ENCOUNTER — Ambulatory Visit
Admission: RE | Admit: 2022-10-02 | Discharge: 2022-10-02 | Disposition: A | Discharge status: Home / Self Care | Payer: BC Managed Care – PPO | Source: Ambulatory Visit | Attending: Hematology and Oncology | Admitting: Hematology and Oncology

## 2022-10-02 ENCOUNTER — Other Ambulatory Visit: Payer: Self-pay | Admitting: Hematology and Oncology

## 2022-10-02 DIAGNOSIS — R600 Localized edema: Secondary | ICD-10-CM | POA: Diagnosis not present

## 2022-10-02 DIAGNOSIS — Z170 Estrogen receptor positive status [ER+]: Secondary | ICD-10-CM | POA: Diagnosis not present

## 2022-10-02 DIAGNOSIS — R293 Abnormal posture: Secondary | ICD-10-CM | POA: Diagnosis not present

## 2022-10-02 DIAGNOSIS — I890 Lymphedema, not elsewhere classified: Secondary | ICD-10-CM | POA: Diagnosis not present

## 2022-10-02 DIAGNOSIS — C50412 Malignant neoplasm of upper-outer quadrant of left female breast: Secondary | ICD-10-CM | POA: Diagnosis not present

## 2022-10-02 DIAGNOSIS — Z483 Aftercare following surgery for neoplasm: Secondary | ICD-10-CM

## 2022-10-02 DIAGNOSIS — Z9889 Other specified postprocedural states: Secondary | ICD-10-CM

## 2022-10-02 DIAGNOSIS — Z853 Personal history of malignant neoplasm of breast: Secondary | ICD-10-CM | POA: Diagnosis not present

## 2022-10-02 DIAGNOSIS — R928 Other abnormal and inconclusive findings on diagnostic imaging of breast: Secondary | ICD-10-CM | POA: Diagnosis not present

## 2022-10-02 NOTE — Therapy (Addendum)
OUTPATIENT PHYSICAL THERAPY ONCOLOGY TREATMENT  Patient Name: Tracy Dyer MRN: [id number] DOB:1970/08/18, 52 y.o., female Today's Date: 10/02/2022   PT End of Session - 10/02/22 1011     Visit Number 16    Number of Visits 28    Date for PT Re-Evaluation 10/30/22    PT Start Time 1004    PT Stop Time 1101    PT Time Calculation (min) 57 min    Activity Tolerance Patient tolerated treatment well    Behavior During Therapy WFL for tasks assessed/performed                Past Medical History:  Diagnosis Date   Anxiety    Cancer ([place])    breast   Depression    Family history of breast cancer    Family history of lung cancer    Family history of ovarian cancer    GERD (gastroesophageal reflux disease)    diet controlled   History of radiation therapy    Left breast, left axilla- 05/27/21-07/10/21- Dr. [name]   Seasonal allergies    Past Surgical History:  Procedure Laterality Date   BREAST BIOPSY Left 2019   x 2 biopsy   BREAST EXCISIONAL BIOPSY Left    BREAST LUMPECTOMY WITH RADIOACTIVE SEED AND SENTINEL LYMPH NODE BIOPSY Left 02/26/2021   Procedure: LEFT BREAST LUMPECTOMY WITH RADIOACTIVE SEED x2; LEFT AXILLARY SENTINEL NODE BIOPSY;  Surgeon: [name], MD;  Location: [facility];  Service: General;  Laterality: Left;   BREAST LUMPECTOMY WITH RADIOFREQUENCY TAG IDENTIFICATION Left [id number]   Procedure: LEFT BREAST MRI WIRE GUIDED EXCISION;  Surgeon: [name], MD;  Location: [facility];  Service: General;  Laterality: Left;   BREAST REDUCTION SURGERY Bilateral 04/18/2021   Procedure: MAMMARY REDUCTION  (BREAST) LEFT ONCOPLASTIC BREAST RECONSTRUCTION, RIGHT BREAST REDUCTION;  Surgeon: Irene Limbo, MD;  Location: [facility];  Service: Plastics;  Laterality: Bilateral;   CHOLECYSTECTOMY  2008   PORT-A-CATH REMOVAL Right 04/18/2021   Procedure: REMOVAL PORT-A-CATH;  Surgeon: [name], MD;   Location: [facility];  Service: General;  Laterality: Right;   PORTACATH PLACEMENT N/A 09/05/2020   Procedure: INSERTION PORT-A-CATH;  Surgeon: [name], MD;  Location: [facility] OR;  Service: General;  Laterality: N/A;  [place] EXTRACTION     Patient Active Problem List   Diagnosis Date Noted   Occipital neuralgia of right side 07/01/2021   Peripheral vertigo 07/01/2021   Genetic testing 09/04/2020   Family history of breast cancer    Family history of ovarian cancer    Family history of lung cancer    Malignant neoplasm of upper-outer quadrant of left breast in female, estrogen receptor positive ([place]) 08/21/2020   Anxiety 11/16/2013   Hypercoagulable state ([place]) 11/16/2013   TIA (transient ischemic attack) 11/15/2013   Chest discomfort 01/20/2012   Factor V Leiden ([place]) 01/20/2012   MVP (mitral valve prolapse) 01/20/2012   Acute bronchitis 12/23/2011    PCP: [name], MD  REFERRING PROVIDER: [name], MD   REFERRING DIAG: [phone] (ICD-10-CM) - Malignant neoplasm of upper-outer quadrant of left breast in female, estrogen receptor positive ([place])  THERAPY DIAG:  Lymphedema, not elsewhere classified  Aftercare following surgery for neoplasm  Malignant neoplasm of upper-outer quadrant of left breast in female, estrogen receptor positive ([place])  Abnormal posture  Localized edema  ONSET DATE: 03/03/22  Rationale for Evaluation and Treatment Rehabilitation  SUBJECTIVE  SUBJECTIVE STATEMENT: My hand is still sore and swollen but it is still better than it was when it flared up initially.   PERTINENT HISTORY:   Lt lumpectomy with SLNB 0/4 LN on 02/26/21, chemotherapy and radiation completed ; ER/PR+, HER2-.  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    PAIN:  Are you having pain? No NPRS scale: 3-4/10  Pain location: left hand Pain orientation: Left  PAIN TYPE: uncomfortable and tight Pain description: intermittent  Aggravating factors: trying to carry something, something tight on arm Relieving factors: nothing  PRECAUTIONS: Other: LUE lymphedema risk  WEIGHT BEARING RESTRICTIONS No  FALLS:  Has patient fallen in last 6 months? No  LIVING ENVIRONMENT: Lives with: lives with their family Lives in: House/apartment Has following equipment at home: None  OCCUPATION: full time, designer  LEISURE: pt is walking 30 min/5 days per week, swimming  HAND DOMINANCE : right   PRIOR LEVEL OF FUNCTION: Independent  PATIENT GOALS  : decrease pain    OBJECTIVE  COGNITION:  Overall cognitive status: Within functional limits for tasks assessed   PALPATION: Cording from axilla to wrist by the thumb  OBSERVATIONS / OTHER ASSESSMENTS: no visible swelling  POSTURE: forward head and rounded shoulders  UPPER EXTREMITY AROM/PROM:  A/PROM RIGHT   eval    Shoulder extension 68   Shoulder flexion 160   Shoulder abduction 160   Shoulder internal rotation    Shoulder external rotation      (Blank rows = not tested)  A/PROM LEFT   eval 08/05/22 10/02/22  Shoulder extension 70    Shoulder flexion 166 143 - pull wrist to axilla  161  Shoulder abduction 173 170 170  Shoulder internal rotation     Shoulder external rotation       (Blank rows = not tested)  Pain with dequervains stretch position  50# grip strength bilaterally  Strength 4+/5   LYMPHEDEMA ASSESSMENTS:  SURGERY TYPE/DATE: Left lumpectomy and SLNB 02/26/21 NUMBER OF LYMPH NODES REMOVED: 0/4 CHEMOTHERAPY: completed RADIATION: completed HORMONE TREATMENT: had to stop due to side effects INFECTIONS: none reported   LYMPHEDEMA ASSESSMENTS:   LANDMARK RIGHT  eval  10 cm proximal to olecranon process 33.2  Olecranon process 26.8  10 cm proximal to ulnar styloid  process 24  Just proximal to ulnar styloid process 16.5  Across hand at thumb web space 19.9  At base of 2nd digit 6.4  (Blank rows = not tested)  LANDMARK LEFT  eval Left 10/02/22  10 cm proximal to olecranon process 34.5 31.6  Olecranon process 26.7 25.7  10 cm proximal to ulnar styloid process 24.1 24.8  Just proximal to ulnar styloid process 16.3 16.7  Across hand at thumb web space 20 18.6  At base of 2nd digit 7 6.4  (Blank rows = not tested)    TODAY'S TREATMENT  Pt permission and consent throughout each step of examination and treatment with modification and draping if requested when working on sensitive areas  10/02/22: Reviewed pts goals for reassess to address newer swelling/lymphedema in her Lt UE. Circumference measurements taken and briefly verbally reviewed bandaging (pt forgot to bring them but will practice more over the weekend and bring to next appt).  Manual Therapy MFR release to cording from antecubital fossa extending to wrist with majority of time spent at medial and lateral side of wrist where cording is the worst and most tender P/ROM to Lt shoulder into end motions of flexion, abd and D2 for  allow for better stretch to cording for MFR, also NTS to try to stretch what feels like a cord at thumb webspace as well In supine: Short neck, 5 diaphragmatic breaths, 5 diaphragmatic breaths, Rt axillary nodes and establishment of anterior interaxillary pathway, Lt inguinal nodes and establishment of axilloinguinal pathway, then L UE working proximal to distal, moving fluid from upper inner arm outwards, and doing both sides of forearm moving fluid towards pathways spending extra time in any areas of fibrosis then retracing all steps   09/16/22 MFR release to cording from antecubital fossa extending to wrist with majority of time spent at medial and lateral side of wrist where cording is the worst and most tender In supine: Short neck, 5 diaphragmatic breaths, superficial  and deep abdominals, bil axillary nodes and establishment of interaxillary pathway, L inguinal nodes and establishment of axilloinguinal pathway, then L UE working proximal to distal, moving fluid from upper inner arm outwards, and doing both sides of forearm moving fluid towards pathways spending extra time in any areas of fibrosis then retracing all steps   09/10/22 Spent entire session on gentle MFR release to cording from antecubital fossa extending to wrist with majority of time spent at medial and lateral side of wrist where cording is the worst and most tender  09/09/23 Redid SOZO and it has decreased to the yellow zone In supine: Short neck, 5 diaphragmatic breaths, superficial and deep abdominals, bil axillary nodes and establishment of interaxillary pathway, L inguinal nodes and establishment of axilloinguinal pathway, then L UE working proximal to distal, moving fluid from upper inner arm outwards, and doing both sides of forearm moving fluid towards pathways spending extra time in any areas of fibrosis then retracing all steps while educating pt throughout and having her return demonstrate correct skin stretch technique Gentle cording release forearm and elbow but more focus on gentle MLD techniques today Compression bandaging: Applied tg soft from wrist to hand, fingers 1-4, artiflex from hand to axilla, 1 6cm hand bandage moving up forearm, and 1 8cm bandage from wrist to axilla with some herringbone at the widest forearm included.  Edu on stretching for cording   PATIENT EDUCATION:  Education details: Self MLD, compression bandaging basics Person educated: Patient Education method: Explanation, Demonstration, handout issued Education comprehension: verbalized understanding, returned demonstration and will benefit from further review  HOME EXERCISE PROGRAM: Wear compression sleeve - stretches for cording and add daily self MLD  ASSESSMENT:  Renewal done this session. Pt is making  good progress with her A/ROM being met and is independent with self MLD at this time. Her cording is coming and going in severity over past few weeks to month with newer onset of swelling/lymphedema as well. She has begun starting to self bandage and will benefit from more review of this along with continued MFR to areas cording.   OBJECTIVE IMPAIRMENTS decreased knowledge of use of DME, increased edema, increased fascial restrictions, postural dysfunction, and pain.   ACTIVITY LIMITATIONS  none  PARTICIPATION LIMITATIONS:  none  PERSONAL FACTORS  none  are also affecting patient's functional outcome.   REHAB POTENTIAL: Excellent  CLINICAL DECISION MAKING: Stable/uncomplicated  EVALUATION COMPLEXITY: Low  GOALS: Goals reviewed with patient? Yes  SHORT TERM GOALS=LONG TERM GOALS  Target date: 10/30/22  Pt will return Lt UE AROM to prior levels to improve mobility  Baseline: Goal status: MET  2.  Pt will report no limitations in ADLs from cording discomfort Baseline: Pt reports 50-60% at this time from  recent flare up x1 month ago - 10/02/22 Goal status: ONGOING  3. Pt will be ind with self MLD and self bandaging for lymphedema management  Baseline: Pt is independent with self MLD, requires more cuing for self bandaging - 10/02/22 Goal Status:PARTIALLY MET  PLAN: PT FREQUENCY: 2x/week  PT DURATION: 4 weeks  PLANNED INTERVENTIONS: Therapeutic exercises, Therapeutic activity, Patient/Family education, Self Care, Joint mobilization, Orthotic/Fit training, Manual lymph drainage, Compression bandaging, scar mobilization, Taping, and Manual therapy  PLAN FOR NEXT SESSION: Renewal done this session. How was bandaging? Did pt try? Self bandaging education.  (Cont as needed MFR to cording in L UE, MLD to Lt UE-cont this and review her technique * Repeat SOZO around 10/29/22)   Otelia Limes, PTA 10/02/2022, 11:08 AM    Start with circles near neck above collarbones, 5 times  each side.    Cancer Rehab [phone] Deep Effective Breath   Standing, sitting, or laying down, place both hands on the belly. Take a deep breath IN, expanding the belly; then breath OUT, contracting the belly. Repeat __5__ times. Do __2-3__ sessions per day and before your self massage.  Axilla to Axilla - Sweep   On uninvolved side make 5 circles in the armpit, then pump _5__ times from involved armpit across chest to uninvolved armpit, making a pathway. Do _1__ time per day.  Copyright  VHI. All rights reserved.  Axilla to Inguinal Nodes - Sweep   On involved side, make 5 circles at groin at panty line, then pump _5__ times from armpit along side of trunk to outer hip, making your other pathway. Do __1_ time per day.  Copyright  VHI. All rights reserved.  Arm Posterior: Elbow to Shoulder - Sweep   Pump _5__ times from back of elbow to top of shoulder. Then inner to outer upper arm _5_ times, then outer arm again _5_ times. Then back to the pathways _2-3_ times. Do _1__ time per day.  Copyright  VHI. All rights reserved.  ARM: [place] Wrist to Elbow - Sweep   Pump or stationary circles _5__ times from wrist to elbow making sure to do both sides of the forearm. Then retrace your steps to the outer arm, and the pathways _2-3_ times each. Do _1__ time per day.  Copyright  VHI. All rights reserved.  ARM: Dorsum of Hand to Shoulder - Sweep   Pump or stationary circles _5__ times on back of hand including knuckle spaces and individual fingers if needed working up towards the wrist, then retrace all your steps working back up the forearm, doing both sides; upper outer arm and back to your pathways _2-3_ times each. Then do 5 circles again at uninvolved armpit and involved groin where you started! Good job!! Do __1_ time per day.

## 2022-10-06 ENCOUNTER — Ambulatory Visit: Payer: BC Managed Care – PPO | Admitting: Rehabilitation

## 2022-10-06 ENCOUNTER — Encounter: Payer: Self-pay | Admitting: Rehabilitation

## 2022-10-06 DIAGNOSIS — R600 Localized edema: Secondary | ICD-10-CM | POA: Diagnosis not present

## 2022-10-06 DIAGNOSIS — Z483 Aftercare following surgery for neoplasm: Secondary | ICD-10-CM

## 2022-10-06 DIAGNOSIS — R293 Abnormal posture: Secondary | ICD-10-CM

## 2022-10-06 DIAGNOSIS — I890 Lymphedema, not elsewhere classified: Secondary | ICD-10-CM | POA: Diagnosis not present

## 2022-10-06 DIAGNOSIS — Z170 Estrogen receptor positive status [ER+]: Secondary | ICD-10-CM | POA: Diagnosis not present

## 2022-10-06 DIAGNOSIS — C50412 Malignant neoplasm of upper-outer quadrant of left female breast: Secondary | ICD-10-CM | POA: Diagnosis not present

## 2022-10-06 NOTE — Therapy (Addendum)
 OUTPATIENT PHYSICAL THERAPY ONCOLOGY TREATMENT  Patient Name: Tracy Dyer MRN: [id number] DOB:1971/02/27, 52 y.o., female Today's Date: 10/06/2022   PT End of Session - 10/06/22 0811     Visit Number 17    Number of Visits 28    Date for PT Re-Evaluation 10/30/22    PT Start Time 0815    PT Stop Time 0857    PT Time Calculation (min) 42 min    Activity Tolerance Patient tolerated treatment well    Behavior During Therapy [facility] for tasks assessed/performed                 Past Medical History:  Diagnosis Date   Anxiety    Cancer (HCC)    breast   Depression    Family history of breast cancer    Family history of lung cancer    Family history of ovarian cancer    GERD (gastroesophageal reflux disease)    diet controlled   History of radiation therapy    Left breast, left axilla- 05/27/21-07/10/21- Dr. Antony Blackbird   Seasonal allergies    Past Surgical History:  Procedure Laterality Date   BREAST BIOPSY Left 2019   x 2 biopsy   BREAST EXCISIONAL BIOPSY Left    BREAST LUMPECTOMY WITH RADIOACTIVE SEED AND SENTINEL LYMPH NODE BIOPSY Left 02/26/2021   Procedure: LEFT BREAST LUMPECTOMY WITH RADIOACTIVE SEED x2; LEFT AXILLARY SENTINEL NODE BIOPSY;  Surgeon: Emelia Loron, MD;  Location: [facility];  Service: General;  Laterality: Left;   BREAST LUMPECTOMY WITH RADIOFREQUENCY TAG IDENTIFICATION Left 04/18/2021   Procedure: LEFT BREAST MRI WIRE GUIDED EXCISION;  Surgeon: Emelia Loron, MD;  Location: [facility];  Service: General;  Laterality: Left;   BREAST REDUCTION SURGERY Bilateral 04/18/2021   Procedure: MAMMARY REDUCTION  (BREAST) LEFT ONCOPLASTIC BREAST RECONSTRUCTION, RIGHT BREAST REDUCTION;  Surgeon: Glenna Fellows, MD;  Location: [facility];  Service: Plastics;  Laterality: Bilateral;   CHOLECYSTECTOMY  2008   PORT-A-CATH REMOVAL Right 04/18/2021   Procedure: REMOVAL PORT-A-CATH;  Surgeon: Emelia Loron,  MD;  Location: [facility];  Service: General;  Laterality: Right;   PORTACATH PLACEMENT N/A 09/05/2020   Procedure: INSERTION PORT-A-CATH;  Surgeon: Emelia Loron, MD;  Location: [facility];  Service: General;  Laterality: N/A;  PEC BLOCK   WISDOM TOOTH EXTRACTION     Patient Active Problem List   Diagnosis Date Noted   Occipital neuralgia of right side 07/01/2021   Peripheral vertigo 07/01/2021   Genetic testing 09/04/2020   Family history of breast cancer    Family history of ovarian cancer    Family history of lung cancer    Malignant neoplasm of upper-outer quadrant of left breast in female, estrogen receptor positive (HCC) 08/21/2020   Anxiety 11/16/2013   Hypercoagulable state (HCC) 11/16/2013   TIA (transient ischemic attack) 11/15/2013   Chest discomfort 01/20/2012   Factor V Leiden (HCC) 01/20/2012   MVP (mitral valve prolapse) 01/20/2012   Acute bronchitis 12/23/2011    PCP: Farris Has, MD  REFERRING PROVIDER: Serena Croissant, MD   REFERRING DIAG: [phone] (ICD-10-CM) - Malignant neoplasm of upper-outer quadrant of left breast in female, estrogen receptor positive (HCC)  THERAPY DIAG:  Lymphedema, not elsewhere classified  Aftercare following surgery for neoplasm  Malignant neoplasm of upper-outer quadrant of left breast in female, estrogen receptor positive (HCC)  Abnormal posture  Localized edema  ONSET DATE: 03/03/22  Rationale for Evaluation and Treatment Rehabilitation  SUBJECTIVE  SUBJECTIVE STATEMENT: It has been pretty feeling decent.  The weird thing is I woke up in the middle of the night with pain in it.    PERTINENT HISTORY:   Lt lumpectomy with SLNB 0/4 LN on 02/26/21, chemotherapy and radiation completed ; ER/PR+, HER2-.  MRI showed no RCT but  some neck OA.   PAIN:  Are you having pain? No  PRECAUTIONS: Other: LUE lymphedema risk  WEIGHT BEARING RESTRICTIONS No  FALLS:  Has patient fallen in last 6 months? No  LIVING ENVIRONMENT: Lives with: lives with their family Lives in: House/apartment Has following equipment at home: None  OCCUPATION: full time, designer  LEISURE: pt is walking 30 min/5 days per week, swimming  HAND DOMINANCE : right   PRIOR LEVEL OF FUNCTION: Independent  PATIENT GOALS  : decrease pain    OBJECTIVE  COGNITION:  Overall cognitive status: Within functional limits for tasks assessed   PALPATION: Cording from axilla to wrist by the thumb  OBSERVATIONS / OTHER ASSESSMENTS: no visible swelling  POSTURE: forward head and rounded shoulders  UPPER EXTREMITY AROM/PROM:  A/PROM RIGHT   eval    Shoulder extension 68   Shoulder flexion 160   Shoulder abduction 160   Shoulder internal rotation    Shoulder external rotation      (Blank rows = not tested)  A/PROM LEFT   eval 08/05/22 10/02/22  Shoulder extension 70    Shoulder flexion 166 143 - pull wrist to axilla  161  Shoulder abduction 173 170 170  Shoulder internal rotation     Shoulder external rotation       (Blank rows = not tested)  Pain with dequervains stretch position  50# grip strength bilaterally  Strength 4+/5   LYMPHEDEMA ASSESSMENTS:  SURGERY TYPE/DATE: Left lumpectomy and SLNB 02/26/21 NUMBER OF LYMPH NODES REMOVED: 0/4 CHEMOTHERAPY: completed RADIATION: completed HORMONE TREATMENT: had to stop due to side effects INFECTIONS: none reported   LYMPHEDEMA ASSESSMENTS:   LANDMARK RIGHT  eval  10 cm proximal to olecranon process 33.2  Olecranon process 26.8  10 cm proximal to ulnar styloid process 24  Just proximal to ulnar styloid process 16.5  Across hand at thumb web space 19.9  At base of 2nd digit 6.4  (Blank rows = not tested)  LANDMARK LEFT  eval Left 10/02/22  10 cm proximal to olecranon process  34.5 31.6  Olecranon process 26.7 25.7  10 cm proximal to ulnar styloid process 24.1 24.8  Just proximal to ulnar styloid process 16.3 16.7  Across hand at thumb web space 20 18.6  At base of 2nd digit 7 6.4  (Blank rows = not tested)    TODAY'S TREATMENT  Pt permission and consent throughout each step of examination and treatment with modification and draping if requested when working on sensitive areas  10/06/22: Manual Therapy Reviewed and pt performed bandaging x doing very well with max vcs to start.  P/ROM to Lt shoulder into end motions of flexion, abd and D2 for allow for better stretch to cording for MFR, also NTS to try to stretch what feels like a cord at thumb webspace as well In supine: Short neck, 5 diaphragmatic breaths, 5 diaphragmatic breaths, Rt axillary nodes and establishment of anterior interaxillary pathway, Lt inguinal nodes and establishment of axilloinguinal pathway, then L UE working proximal to distal, moving fluid from upper inner arm outwards, and doing both sides of forearm moving fluid towards pathways spending extra time in any areas of  fibrosis then retracing all steps   10/02/22: Reviewed pts goals for reassess to address newer swelling/lymphedema in her Lt UE. Circumference measurements taken and briefly verbally reviewed bandaging (pt forgot to bring them but will practice more over the weekend and bring to next appt).  Manual Therapy MFR release to cording from antecubital fossa extending to wrist with majority of time spent at medial and lateral side of wrist where cording is the worst and most tender P/ROM to Lt shoulder into end motions of flexion, abd and D2 for allow for better stretch to cording for MFR, also NTS to try to stretch what feels like a cord at thumb webspace as well In supine: Short neck, 5 diaphragmatic breaths, 5 diaphragmatic breaths, Rt axillary nodes and establishment of anterior interaxillary pathway, Lt inguinal nodes and  establishment of axilloinguinal pathway, then L UE working proximal to distal, moving fluid from upper inner arm outwards, and doing both sides of forearm moving fluid towards pathways spending extra time in any areas of fibrosis then retracing all steps   09/16/22 MFR release to cording from antecubital fossa extending to wrist with majority of time spent at medial and lateral side of wrist where cording is the worst and most tender In supine: Short neck, 5 diaphragmatic breaths, superficial and deep abdominals, bil axillary nodes and establishment of interaxillary pathway, L inguinal nodes and establishment of axilloinguinal pathway, then L UE working proximal to distal, moving fluid from upper inner arm outwards, and doing both sides of forearm moving fluid towards pathways spending extra time in any areas of fibrosis then retracing all steps    PATIENT EDUCATION:  Education details: Self MLD, compression bandaging basics Person educated: Patient Education method: Explanation, Demonstration, handout issued Education comprehension: verbalized understanding, returned demonstration and will benefit from further review  HOME EXERCISE PROGRAM: Wear compression sleeve - stretches for cording and add daily self MLD Bandage at night  ASSESSMENT: is independent with self MLD at this time but had not done her bandaging as it made her nervous.  Focused on this today with pt doing well after cueing and demo.  Reminded her of video. Pt will start this at night.   OBJECTIVE IMPAIRMENTS decreased knowledge of use of DME, increased edema, increased fascial restrictions, postural dysfunction, and pain.   ACTIVITY LIMITATIONS  none  PARTICIPATION LIMITATIONS:  none  PERSONAL FACTORS  none  are also affecting patient's functional outcome.   REHAB POTENTIAL: Excellent  CLINICAL DECISION MAKING: Stable/uncomplicated  EVALUATION COMPLEXITY: Low  GOALS: Goals reviewed with patient? Yes  SHORT TERM  GOALS=LONG TERM GOALS  Target date: 09/30/22  Pt will return Lt UE AROM to prior levels to improve mobility  Baseline: Goal status: MET  2.  Pt will report no limitations in ADLs from cording discomfort Baseline: Pt reports 50-60% at this time from recent flare up x1 month ago - 10/02/22 Goal status: ONGOING  3. Pt will be ind with self MLD and self bandaging for lymphedema management  Baseline: Pt is independent with self MLD, requires more cuing for self bandaging - 10/02/22 Goal Status:PARTIALLY MET  PLAN: PT FREQUENCY: 2x/week  PT DURATION: 4 weeks  PLANNED INTERVENTIONS: Therapeutic exercises, Therapeutic activity, Patient/Family education, Self Care, Joint mobilization, Orthotic/Fit training, Manual lymph drainage, Compression bandaging, scar mobilization, Taping, and Manual therapy  PLAN FOR NEXT SESSION:  How was bandaging? Did pt try? Self bandaging education.  (Cont as needed MFR to cording in L UE, MLD to Lt UE-cont this and review  her technique * Repeat SOZO around 10/29/22)  PHYSICAL THERAPY DISCHARGE SUMMARY  Visits from Start of Care: 17  Current functional level related to goals / functional outcomes: Per above   Remaining deficits: lymphedema risk   Education / Equipment: Self care HEP   Plan: Patient agrees to discharge.       Idamae Lusher, PT 10/06/2022, 8:58 AM    Start with circles near neck above collarbones, 5 times each side.    Cancer Rehab [phone] Deep Effective Breath   Standing, sitting, or laying down, place both hands on the belly. Take a deep breath IN, expanding the belly; then breath OUT, contracting the belly. Repeat __5__ times. Do __2-3__ sessions per day and before your self massage.  Axilla to Axilla - Sweep   On uninvolved side make 5 circles in the armpit, then pump _5__ times from involved armpit across chest to uninvolved armpit, making a pathway. Do _1__ time per day.  Copyright  VHI. All rights reserved.  Axilla to  Inguinal Nodes - Sweep   On involved side, make 5 circles at groin at panty line, then pump _5__ times from armpit along side of trunk to outer hip, making your other pathway. Do __1_ time per day.  Copyright  VHI. All rights reserved.  Arm Posterior: Elbow to Shoulder - Sweep   Pump _5__ times from back of elbow to top of shoulder. Then inner to outer upper arm _5_ times, then outer arm again _5_ times. Then back to the pathways _2-3_ times. Do _1__ time per day.  Copyright  VHI. All rights reserved.  ARM: Volar Wrist to Elbow - Sweep   Pump or stationary circles _5__ times from wrist to elbow making sure to do both sides of the forearm. Then retrace your steps to the outer arm, and the pathways _2-3_ times each. Do _1__ time per day.  Copyright  VHI. All rights reserved.  ARM: Dorsum of Hand to Shoulder - Sweep   Pump or stationary circles _5__ times on back of hand including knuckle spaces and individual fingers if needed working up towards the wrist, then retrace all your steps working back up the forearm, doing both sides; upper outer arm and back to your pathways _2-3_ times each. Then do 5 circles again at uninvolved armpit and involved groin where you started! Good job!! Do __1_ time per day.

## 2022-10-09 ENCOUNTER — Other Ambulatory Visit (HOSPITAL_BASED_OUTPATIENT_CLINIC_OR_DEPARTMENT_OTHER): Payer: Self-pay

## 2022-10-09 DIAGNOSIS — C50912 Malignant neoplasm of unspecified site of left female breast: Secondary | ICD-10-CM | POA: Diagnosis not present

## 2022-10-09 DIAGNOSIS — D6859 Other primary thrombophilia: Secondary | ICD-10-CM | POA: Diagnosis not present

## 2022-10-09 DIAGNOSIS — H00012 Hordeolum externum right lower eyelid: Secondary | ICD-10-CM | POA: Diagnosis not present

## 2022-10-09 MED ORDER — GENTAMICIN SULFATE 0.3 % OP SOLN
OPHTHALMIC | 0 refills | Status: DC
Start: 2022-10-09 — End: 2023-05-07
  Filled 2022-10-09 (×2): qty 5, 10d supply, fill #0

## 2022-10-09 MED ORDER — ZEPBOUND 10 MG/0.5ML ~~LOC~~ SOAJ
10.0000 mg | SUBCUTANEOUS | 0 refills | Status: DC
  Filled 2022-10-09: qty 2, 28d supply, fill #0

## 2022-10-20 ENCOUNTER — Inpatient Hospital Stay: Payer: BC Managed Care – PPO | Attending: Hematology and Oncology | Admitting: Hematology and Oncology

## 2022-10-20 DIAGNOSIS — H8113 Benign paroxysmal vertigo, bilateral: Secondary | ICD-10-CM | POA: Diagnosis not present

## 2022-10-20 DIAGNOSIS — R519 Headache, unspecified: Secondary | ICD-10-CM | POA: Diagnosis not present

## 2022-10-20 DIAGNOSIS — M47892 Other spondylosis, cervical region: Secondary | ICD-10-CM | POA: Diagnosis not present

## 2022-10-20 DIAGNOSIS — R278 Other lack of coordination: Secondary | ICD-10-CM | POA: Diagnosis not present

## 2022-10-20 NOTE — Assessment & Plan Note (Deleted)
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  Neoadjuvant chemotherapy with dose dense Adriamycin and Cytoxan followed by Taxol weekly x12 completed 01/17/2021 2. 02/26/2021 breast conserving surgery with sentinel lymph node and targeted node dissection,: Path CR 0/4 LN Neg 04/18/21: Bilateral Mammoplasty: Benign 3.  Adjuvant radiation therapy 05/28/21- 07/10/21 4.  Followed by adjuvant antiestrogen therapy with tamoxifen--unable to tolerate, on Letrozole daily switched to exemestane discontinued for intolerance -------------------------------------------------------------------------------------------------------------------------    PET CT 10/01/21: No evidence of metastatic disease postradiation changes in the lungs Based on severe intolerance antiestrogen therapy we decided not to pursue any further antiestrogen treatments.   Breast cancer surveillance: 1.  Annual mammograms 10/02/2022: Benign breast density category B 2. Signatera testing for minimal residual disease   Severe stress and agitation: We counseled her extensively manage stress of fear of breast cancer recurrence. Obesity: Patient is trying to go on weight loss medications.  Return to clinic in 1 year for follow-up

## 2022-10-20 NOTE — Addendum Note (Signed)
Addended by: Shan Levans R on: 10/20/2022 10:30 AM   Modules accepted: Orders

## 2022-10-23 DIAGNOSIS — M9901 Segmental and somatic dysfunction of cervical region: Secondary | ICD-10-CM | POA: Diagnosis not present

## 2022-10-23 DIAGNOSIS — M7541 Impingement syndrome of right shoulder: Secondary | ICD-10-CM | POA: Diagnosis not present

## 2022-10-23 DIAGNOSIS — M9907 Segmental and somatic dysfunction of upper extremity: Secondary | ICD-10-CM | POA: Diagnosis not present

## 2022-10-23 DIAGNOSIS — M9902 Segmental and somatic dysfunction of thoracic region: Secondary | ICD-10-CM | POA: Diagnosis not present

## 2022-10-26 DIAGNOSIS — M47892 Other spondylosis, cervical region: Secondary | ICD-10-CM | POA: Diagnosis not present

## 2022-10-26 DIAGNOSIS — H8113 Benign paroxysmal vertigo, bilateral: Secondary | ICD-10-CM | POA: Diagnosis not present

## 2022-10-26 DIAGNOSIS — R519 Headache, unspecified: Secondary | ICD-10-CM | POA: Diagnosis not present

## 2022-10-26 DIAGNOSIS — R278 Other lack of coordination: Secondary | ICD-10-CM | POA: Diagnosis not present

## 2022-10-30 DIAGNOSIS — M9902 Segmental and somatic dysfunction of thoracic region: Secondary | ICD-10-CM | POA: Diagnosis not present

## 2022-10-30 DIAGNOSIS — M9903 Segmental and somatic dysfunction of lumbar region: Secondary | ICD-10-CM | POA: Diagnosis not present

## 2022-10-30 DIAGNOSIS — M9901 Segmental and somatic dysfunction of cervical region: Secondary | ICD-10-CM | POA: Diagnosis not present

## 2022-10-30 DIAGNOSIS — M7541 Impingement syndrome of right shoulder: Secondary | ICD-10-CM | POA: Diagnosis not present

## 2022-10-30 DIAGNOSIS — M9907 Segmental and somatic dysfunction of upper extremity: Secondary | ICD-10-CM | POA: Diagnosis not present

## 2022-11-04 ENCOUNTER — Ambulatory Visit: Payer: BC Managed Care – PPO | Admitting: Orthopedic Surgery

## 2022-11-04 DIAGNOSIS — M25512 Pain in left shoulder: Secondary | ICD-10-CM | POA: Diagnosis not present

## 2022-11-05 ENCOUNTER — Encounter: Payer: Self-pay | Admitting: Hematology and Oncology

## 2022-11-05 DIAGNOSIS — M9901 Segmental and somatic dysfunction of cervical region: Secondary | ICD-10-CM | POA: Diagnosis not present

## 2022-11-05 DIAGNOSIS — M7541 Impingement syndrome of right shoulder: Secondary | ICD-10-CM | POA: Diagnosis not present

## 2022-11-05 DIAGNOSIS — M9907 Segmental and somatic dysfunction of upper extremity: Secondary | ICD-10-CM | POA: Diagnosis not present

## 2022-11-05 DIAGNOSIS — M9902 Segmental and somatic dysfunction of thoracic region: Secondary | ICD-10-CM | POA: Diagnosis not present

## 2022-11-06 ENCOUNTER — Other Ambulatory Visit (HOSPITAL_BASED_OUTPATIENT_CLINIC_OR_DEPARTMENT_OTHER): Payer: Self-pay

## 2022-11-06 ENCOUNTER — Encounter (HOSPITAL_BASED_OUTPATIENT_CLINIC_OR_DEPARTMENT_OTHER): Payer: Self-pay | Admitting: Pharmacist

## 2022-11-06 MED ORDER — ZEPBOUND 10 MG/0.5ML ~~LOC~~ SOAJ
10.0000 mg | SUBCUTANEOUS | 0 refills | Status: AC
Start: 2022-11-06 — End: ?
  Filled 2022-11-06: qty 2, 28d supply, fill #0

## 2022-11-07 ENCOUNTER — Encounter: Payer: Self-pay | Admitting: Orthopedic Surgery

## 2022-11-07 NOTE — Progress Notes (Addendum)
Office Visit Note   Patient: Tracy Dyer           Date of Birth: 1971/07/19           MRN: [id number] Visit Date: 11/04/2022 Requested by: Jettie Pagan, NP [address] [place],  [place] [address] PCP: Jettie Pagan, NP  Subjective: Chief Complaint  Patient presents with   Left Shoulder - Pain   Neck - Pain    HPI: Tracy Dyer is a 52 y.o. female who presents to the office reporting neck and left shoulder and arm pain.  Describes decreased range of motion.  Anterior pain which localizes just below the deltopectoral attachment site.  There is also radicular component.  Pain wakes her from sleep at night.  Has a history of left-sided breast cancer.  Denies any numbness and tingling in the hand.  She has tried chiropractic and massage therapy now for over 6 weeks.  She does have a history of breast cancer in 2022.  She has had 4 surgeries for that.  She states that the arm is weak at this time.  Radiating into the shoulder and neck.  Arm pain is worse in the shoulder.  Vitamins and Aleve for pain have not given much relief.  She has had prior right shoulder and neck MRI scan several months ago for similar issue.  Does not appear to be an issue with the neck..                ROS: All systems reviewed are negative as they relate to the chief complaint within the history of present illness.  Patient denies fevers or chills.  Assessment & Plan: Visit Diagnoses:  1. Left shoulder pain, unspecified chronicity     Plan: Impression is left-sided shoulder and arm pain.  Concern would be for pathology in the proximal humeral region.  Radiographs unremarkable in this regard.  Needs MRI scan of the left shoulder to evaluate this weakness as well as MRI of the left humerus with a BB in the area of maximal pain which is around just proximal to the mid humeral level.  Pet imaging 3/23 unremarkable.  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  Generally unremarkable scan.  Follow-Up Instructions: No follow-ups on file.   Orders:  Orders Placed This Encounter  Procedures   MR Shoulder Left w/o contrast   MR HUMERUS LEFT WO CONTRAST   No orders of the defined types were placed in this encounter.     Procedures: No procedures performed   Clinical Data: No additional findings.  Objective: Vital Signs: There were no vitals taken for this visit.  Physical Exam:  Constitutional: Patient appears well-developed HEENT:  Head: Normocephalic Eyes:EOM are normal Neck: Normal range of motion Cardiovascular: Normal rate Pulmonary/chest: Effort normal Neurologic: Patient is alert Skin: Skin is warm Psychiatric: Patient has normal mood and affect  Ortho Exam: Ortho exam demonstrates good cervical spine range of motion.  5 out of 5 grip EPL FPL interosseous wrist flexion extension bicep triceps and deltoid strength.  She has prior incision on that left axillary region.  Rotator cuff strength is intact.  Does have discrete tenderness just about 2 fingerbreadths proximal in the humeral region to the deltoid attachment.  Deltoid is functional.  No lymphadenopathy is present.  No warmth in the shoulder left versus right.  No crepitus with internal/external rotation of the shoulder  at 90 degrees of abduction.  Forward flexion and abduction both above 90 degrees.  Cervical spine range of motion intact.  No paresthesias C5-T1.  Radial pulse intact.  Specialty Comments:  No specialty comments available.  Imaging: No results found.   PMFS History: Patient Active Problem List   Diagnosis Date Noted   Occipital neuralgia of right side 07/01/2021   Peripheral vertigo 07/01/2021   Genetic testing 09/04/2020   Family history of breast cancer    Family history of ovarian cancer    Family history of lung cancer    Malignant neoplasm of upper-outer quadrant of left breast in female, estrogen  receptor positive 08/21/2020   Anxiety 11/16/2013   Hypercoagulable state 11/16/2013   TIA (transient ischemic attack) 11/15/2013   Chest discomfort 01/20/2012   Factor V Leiden 01/20/2012   MVP (mitral valve prolapse) 01/20/2012   Acute bronchitis 12/23/2011   Past Medical History:  Diagnosis Date   Anxiety    Cancer    breast   Depression    Family history of breast cancer    Family history of lung cancer    Family history of ovarian cancer    GERD (gastroesophageal reflux disease)    diet controlled   History of radiation therapy    Left breast, left axilla- 05/27/21-07/10/21- Dr. Antony BlackbirdJames Kinard   Seasonal allergies     Family History  Problem Relation Age of Onset   Eczema Father    Allergic rhinitis Brother    Breast cancer Mother        in 8050's   Cancer Maternal Uncle        unk type   Lung cancer Maternal Grandfather 42   Heart Problems Paternal Grandfather    Melanoma Cousin    Ovarian cancer Paternal Great-grandmother    Cancer Paternal Great-grandmother        GYN cancer, possibly ovarian?   Cancer Maternal Aunt        half aunts/uncles x5- rare cancers    Past Surgical History:  Procedure Laterality Date   BREAST BIOPSY Left 2019   x 2 biopsy   BREAST EXCISIONAL BIOPSY Left    BREAST LUMPECTOMY WITH RADIOACTIVE SEED AND SENTINEL LYMPH NODE BIOPSY Left 02/26/2021   Procedure: LEFT BREAST LUMPECTOMY WITH RADIOACTIVE SEED x2; LEFT AXILLARY SENTINEL NODE BIOPSY;  Surgeon: Emelia LoronWakefield, Matthew, MD;  Location: [facility];  Service: General;  Laterality: Left;   BREAST LUMPECTOMY WITH RADIOFREQUENCY TAG IDENTIFICATION Left 04/18/2021   Procedure: LEFT BREAST MRI WIRE GUIDED EXCISION;  Surgeon: Emelia LoronWakefield, Matthew, MD;  Location: [facility];  Service: General;  Laterality: Left;   BREAST REDUCTION SURGERY Bilateral 04/18/2021   Procedure: MAMMARY REDUCTION  (BREAST) LEFT ONCOPLASTIC BREAST RECONSTRUCTION, RIGHT BREAST REDUCTION;  Surgeon:  Glenna Fellowshimmappa, Brinda, MD;  Location: [facility];  Service: Plastics;  Laterality: Bilateral;   CHOLECYSTECTOMY  2008   PORT-A-CATH REMOVAL Right 04/18/2021   Procedure: REMOVAL PORT-A-CATH;  Surgeon: Emelia LoronWakefield, Matthew, MD;  Location: [facility];  Service: General;  Laterality: Right;   PORTACATH PLACEMENT N/A 09/05/2020   Procedure: INSERTION PORT-A-CATH;  Surgeon: Emelia LoronWakefield, Matthew, MD;  [facility];  Service: General;  Laterality: N/A;  PEC BLOCK   WISDOM TOOTH EXTRACTION     Social History   Occupational History   Not on file  Tobacco Use   Smoking status: Never   Smokeless tobacco: Never  Vaping Use   Vaping Use: Never used  Substance and Sexual Activity  Alcohol use: Not Currently   Drug use: Never   Sexual activity: Yes    Birth control/protection: None

## 2022-11-09 ENCOUNTER — Other Ambulatory Visit (HOSPITAL_BASED_OUTPATIENT_CLINIC_OR_DEPARTMENT_OTHER): Payer: Self-pay

## 2022-11-11 ENCOUNTER — Other Ambulatory Visit (HOSPITAL_BASED_OUTPATIENT_CLINIC_OR_DEPARTMENT_OTHER): Payer: Self-pay

## 2022-11-11 MED ORDER — ZEPBOUND 12.5 MG/0.5ML ~~LOC~~ SOAJ
12.5000 mg | SUBCUTANEOUS | 0 refills | Status: DC
  Filled 2022-11-11: qty 2, 28d supply, fill #0

## 2022-11-13 DIAGNOSIS — M9907 Segmental and somatic dysfunction of upper extremity: Secondary | ICD-10-CM | POA: Diagnosis not present

## 2022-11-13 DIAGNOSIS — M9901 Segmental and somatic dysfunction of cervical region: Secondary | ICD-10-CM | POA: Diagnosis not present

## 2022-11-13 DIAGNOSIS — M9902 Segmental and somatic dysfunction of thoracic region: Secondary | ICD-10-CM | POA: Diagnosis not present

## 2022-11-13 DIAGNOSIS — M7541 Impingement syndrome of right shoulder: Secondary | ICD-10-CM | POA: Diagnosis not present

## 2022-11-19 DIAGNOSIS — M9901 Segmental and somatic dysfunction of cervical region: Secondary | ICD-10-CM | POA: Diagnosis not present

## 2022-11-19 DIAGNOSIS — M7541 Impingement syndrome of right shoulder: Secondary | ICD-10-CM | POA: Diagnosis not present

## 2022-11-19 DIAGNOSIS — M9902 Segmental and somatic dysfunction of thoracic region: Secondary | ICD-10-CM | POA: Diagnosis not present

## 2022-11-19 DIAGNOSIS — M9907 Segmental and somatic dysfunction of upper extremity: Secondary | ICD-10-CM | POA: Diagnosis not present

## 2022-11-25 ENCOUNTER — Ambulatory Visit
Admission: RE | Admit: 2022-11-25 | Discharge: 2022-11-25 | Disposition: A | Discharge status: Home / Self Care | Payer: BC Managed Care – PPO | Source: Ambulatory Visit | Attending: Orthopedic Surgery | Admitting: Orthopedic Surgery

## 2022-11-25 DIAGNOSIS — M25512 Pain in left shoulder: Secondary | ICD-10-CM

## 2022-11-25 DIAGNOSIS — R531 Weakness: Secondary | ICD-10-CM | POA: Diagnosis not present

## 2022-11-25 DIAGNOSIS — S46012A Strain of muscle(s) and tendon(s) of the rotator cuff of left shoulder, initial encounter: Secondary | ICD-10-CM | POA: Diagnosis not present

## 2022-11-26 DIAGNOSIS — M7541 Impingement syndrome of right shoulder: Secondary | ICD-10-CM | POA: Diagnosis not present

## 2022-11-26 DIAGNOSIS — M9901 Segmental and somatic dysfunction of cervical region: Secondary | ICD-10-CM | POA: Diagnosis not present

## 2022-11-26 DIAGNOSIS — M9907 Segmental and somatic dysfunction of upper extremity: Secondary | ICD-10-CM | POA: Diagnosis not present

## 2022-11-26 DIAGNOSIS — M9902 Segmental and somatic dysfunction of thoracic region: Secondary | ICD-10-CM | POA: Diagnosis not present

## 2022-12-04 ENCOUNTER — Encounter (HOSPITAL_BASED_OUTPATIENT_CLINIC_OR_DEPARTMENT_OTHER): Payer: Self-pay

## 2022-12-04 ENCOUNTER — Other Ambulatory Visit (HOSPITAL_BASED_OUTPATIENT_CLINIC_OR_DEPARTMENT_OTHER): Payer: Self-pay

## 2022-12-04 DIAGNOSIS — M7541 Impingement syndrome of right shoulder: Secondary | ICD-10-CM | POA: Diagnosis not present

## 2022-12-04 DIAGNOSIS — M9902 Segmental and somatic dysfunction of thoracic region: Secondary | ICD-10-CM | POA: Diagnosis not present

## 2022-12-04 DIAGNOSIS — M9907 Segmental and somatic dysfunction of upper extremity: Secondary | ICD-10-CM | POA: Diagnosis not present

## 2022-12-04 DIAGNOSIS — M9901 Segmental and somatic dysfunction of cervical region: Secondary | ICD-10-CM | POA: Diagnosis not present

## 2022-12-04 MED ORDER — ZEPBOUND 15 MG/0.5ML ~~LOC~~ SOAJ
SUBCUTANEOUS | 0 refills | Status: DC
  Filled 2022-12-04 (×2): qty 2, 28d supply, fill #0

## 2022-12-05 ENCOUNTER — Other Ambulatory Visit (HOSPITAL_BASED_OUTPATIENT_CLINIC_OR_DEPARTMENT_OTHER): Payer: Self-pay

## 2022-12-07 ENCOUNTER — Ambulatory Visit: Payer: BC Managed Care – PPO | Admitting: Orthopedic Surgery

## 2022-12-07 ENCOUNTER — Other Ambulatory Visit (HOSPITAL_BASED_OUTPATIENT_CLINIC_OR_DEPARTMENT_OTHER): Payer: Self-pay

## 2022-12-07 ENCOUNTER — Encounter (HOSPITAL_BASED_OUTPATIENT_CLINIC_OR_DEPARTMENT_OTHER): Payer: Self-pay

## 2022-12-07 ENCOUNTER — Encounter: Payer: Self-pay | Admitting: Orthopedic Surgery

## 2022-12-07 DIAGNOSIS — M25512 Pain in left shoulder: Secondary | ICD-10-CM

## 2022-12-08 ENCOUNTER — Encounter: Payer: Self-pay | Admitting: Orthopedic Surgery

## 2022-12-08 NOTE — Progress Notes (Signed)
Office Visit Note   Patient: Tracy Dyer           Date of Birth: [birth year]           MRN: [id number] Visit Date: 12/07/2022 Requested by: Jettie Pagan, NP [address] [place],  [place] [address] PCP: Jettie Pagan, NP  Subjective: Chief Complaint  Patient presents with   Left Shoulder - Pain, Follow-up    HPI: Tracy Dyer is a 52 y.o. female who presents to the office reporting left shoulder and humeral pain.  Since she was last seen she had an MRI scan but the left shoulder and humerus.  There is not an official reading for the humerus but I reviewed the scan and did not see anything significantly abnormal.  Shoulder MRI scan is reviewed with the patient.  No rotator cuff tear.  Mild tendinosis is present.  A little bit of fluid in the [facility] joint but she is not really symptomatic in that region..                ROS: All systems reviewed are negative as they relate to the chief complaint within the history of present illness.  Patient denies fevers or chills.  Assessment & Plan: Visit Diagnoses:  1. Left shoulder pain, unspecified chronicity     Plan: Impression is left proximal shoulder pain with C-spine MRI done in November 23 which was negative for any left-sided pathology.  Structurally the left shoulder and humerus look good.  She is having some pain and I discussed that injections in either the shoulder or the neck would be the neck step.  She wants to hold off on that intervention for now.  Follow-up as needed.  Follow-Up Instructions: No follow-ups on file.   Orders:  No orders of the defined types were placed in this encounter.  No orders of the defined types were placed in this encounter.     Procedures: No procedures performed   Clinical Data: No additional findings.  Objective: Vital Signs: There were no vitals taken for this visit.  Physical Exam:  Constitutional: Patient appears well-developed HEENT:  Head:  Normocephalic Eyes:EOM are normal Neck: Normal range of motion Cardiovascular: Normal rate Pulmonary/chest: Effort normal Neurologic: Patient is alert Skin: Skin is warm Psychiatric: Patient has normal mood and affect  Ortho Exam: Ortho exam demonstrates full active and passive range of motion of the left and right shoulder.  Neck range of motion is full.  No paresthesias C5-T1.  Excellent rotator cuff strength infraspinatus supraspinatus and subscap muscle testing.  Mild biceps tenderness.  No discrete AC joint tenderness on the left.  No coarse grinding or crepitus with internal/external rotation of the left arm at 90 degrees of abduction.  Specialty Comments:  No specialty comments available.  Imaging: No results found.   PMFS History: Patient Active Problem List   Diagnosis Date Noted   Occipital neuralgia of right side 07/01/2021   Peripheral vertigo 07/01/2021   Genetic testing 09/04/2020   Family history of breast cancer    Family history of ovarian cancer    Family history of lung cancer    Malignant neoplasm of upper-outer quadrant of left breast in female, estrogen receptor positive (HCC) 08/21/2020   Anxiety 11/16/2013   Hypercoagulable state (HCC) 11/16/2013   TIA (transient ischemic attack) 11/15/2013   Chest discomfort 01/20/2012   Factor V Leiden (HCC) 01/20/2012   MVP (mitral valve prolapse) 01/20/2012   Acute bronchitis 12/23/2011  Past Medical History:  Diagnosis Date   Anxiety    Cancer (HCC)    breast   Depression    Family history of breast cancer    Family history of lung cancer    Family history of ovarian cancer    GERD (gastroesophageal reflux disease)    diet controlled   History of radiation therapy    Left breast, left axilla- 05/27/21-07/10/21- Dr. Antony Blackbird   Seasonal allergies     Family History  Problem Relation Age of Onset   Eczema Father    Allergic rhinitis Brother    Breast cancer Mother        in 69's   Cancer Maternal  Uncle        unk type   Lung cancer Maternal Grandfather 42   Heart Problems Paternal Grandfather    Melanoma Cousin    Ovarian cancer Paternal Great-grandmother    Cancer Paternal Great-grandmother        GYN cancer, possibly ovarian?   Cancer Maternal Aunt        half aunts/uncles x5- rare cancers    Past Surgical History:  Procedure Laterality Date   BREAST BIOPSY Left 2019   x 2 biopsy   BREAST EXCISIONAL BIOPSY Left    BREAST LUMPECTOMY WITH RADIOACTIVE SEED AND SENTINEL LYMPH NODE BIOPSY Left 02/26/2021   Procedure: LEFT BREAST LUMPECTOMY WITH RADIOACTIVE SEED x2; LEFT AXILLARY SENTINEL NODE BIOPSY;  Surgeon: Emelia Loron, MD;  Location: [facility];  Service: General;  Laterality: Left;   BREAST LUMPECTOMY WITH RADIOFREQUENCY TAG IDENTIFICATION Left 04/18/2021   Procedure: LEFT BREAST MRI WIRE GUIDED EXCISION;  Surgeon: Emelia Loron, MD;  Location: [facility];  Service: General;  Laterality: Left;   BREAST REDUCTION SURGERY Bilateral 04/18/2021   Procedure: MAMMARY REDUCTION  (BREAST) LEFT ONCOPLASTIC BREAST RECONSTRUCTION, RIGHT BREAST REDUCTION;  Surgeon: Glenna Fellows, MD;  Location: [facility];  Service: Plastics;  Laterality: Bilateral;   CHOLECYSTECTOMY  2008   PORT-A-CATH REMOVAL Right 04/18/2021   Procedure: REMOVAL PORT-A-CATH;  Surgeon: Emelia Loron, MD;  Location: [facility];  Service: General;  Laterality: Right;   PORTACATH PLACEMENT N/A 09/05/2020   Procedure: INSERTION PORT-A-CATH;  Surgeon: Emelia Loron, MD;  Location: [facility];  Service: General;  Laterality: N/A;  PEC BLOCK   WISDOM TOOTH EXTRACTION     Social History   Occupational History   Not on file  Tobacco Use   Smoking status: Never   Smokeless tobacco: Never  Vaping Use   Vaping Use: Never used  Substance and Sexual Activity   Alcohol use: Not Currently   Drug use: Never   Sexual activity: Yes    Birth  control/protection: None

## 2022-12-09 ENCOUNTER — Other Ambulatory Visit (HOSPITAL_BASED_OUTPATIENT_CLINIC_OR_DEPARTMENT_OTHER): Payer: Self-pay

## 2022-12-11 ENCOUNTER — Other Ambulatory Visit (HOSPITAL_BASED_OUTPATIENT_CLINIC_OR_DEPARTMENT_OTHER): Payer: Self-pay

## 2022-12-11 DIAGNOSIS — C50412 Malignant neoplasm of upper-outer quadrant of left female breast: Secondary | ICD-10-CM | POA: Diagnosis not present

## 2022-12-11 DIAGNOSIS — Z170 Estrogen receptor positive status [ER+]: Secondary | ICD-10-CM | POA: Diagnosis not present

## 2022-12-14 DIAGNOSIS — M7541 Impingement syndrome of right shoulder: Secondary | ICD-10-CM | POA: Diagnosis not present

## 2022-12-14 DIAGNOSIS — M9902 Segmental and somatic dysfunction of thoracic region: Secondary | ICD-10-CM | POA: Diagnosis not present

## 2022-12-14 DIAGNOSIS — M9901 Segmental and somatic dysfunction of cervical region: Secondary | ICD-10-CM | POA: Diagnosis not present

## 2022-12-14 DIAGNOSIS — M9907 Segmental and somatic dysfunction of upper extremity: Secondary | ICD-10-CM | POA: Diagnosis not present

## 2022-12-20 ENCOUNTER — Other Ambulatory Visit (HOSPITAL_BASED_OUTPATIENT_CLINIC_OR_DEPARTMENT_OTHER): Payer: Self-pay

## 2022-12-23 NOTE — Telephone Encounter (Signed)
Called pt per MD to advise [facility] testing was negative/not detected. Pt verbalized understanding of results and knows [facility] will be in touch to schedule 3 mo repeat lab.   

## 2022-12-24 ENCOUNTER — Encounter: Payer: Self-pay | Admitting: Hematology and Oncology

## 2023-01-13 DIAGNOSIS — M47892 Other spondylosis, cervical region: Secondary | ICD-10-CM | POA: Diagnosis not present

## 2023-01-13 DIAGNOSIS — H8113 Benign paroxysmal vertigo, bilateral: Secondary | ICD-10-CM | POA: Diagnosis not present

## 2023-01-13 DIAGNOSIS — R519 Headache, unspecified: Secondary | ICD-10-CM | POA: Diagnosis not present

## 2023-01-13 DIAGNOSIS — R278 Other lack of coordination: Secondary | ICD-10-CM | POA: Diagnosis not present

## 2023-01-14 ENCOUNTER — Other Ambulatory Visit (HOSPITAL_BASED_OUTPATIENT_CLINIC_OR_DEPARTMENT_OTHER): Payer: Self-pay

## 2023-01-15 ENCOUNTER — Other Ambulatory Visit: Payer: Self-pay | Admitting: Hematology and Oncology

## 2023-01-25 ENCOUNTER — Other Ambulatory Visit (HOSPITAL_BASED_OUTPATIENT_CLINIC_OR_DEPARTMENT_OTHER): Payer: Self-pay

## 2023-02-11 ENCOUNTER — Other Ambulatory Visit (HOSPITAL_BASED_OUTPATIENT_CLINIC_OR_DEPARTMENT_OTHER): Payer: Self-pay

## 2023-02-11 MED ORDER — ZEPBOUND 15 MG/0.5ML ~~LOC~~ SOAJ
SUBCUTANEOUS | 0 refills | Status: DC
  Filled 2023-02-11: qty 6, 84d supply, fill #0
  Filled 2023-02-11: qty 2, 28d supply, fill #0

## 2023-02-13 ENCOUNTER — Other Ambulatory Visit (HOSPITAL_BASED_OUTPATIENT_CLINIC_OR_DEPARTMENT_OTHER): Payer: Self-pay

## 2023-02-17 DIAGNOSIS — H1789 Other corneal scars and opacities: Secondary | ICD-10-CM | POA: Diagnosis not present

## 2023-02-17 DIAGNOSIS — L989 Disorder of the skin and subcutaneous tissue, unspecified: Secondary | ICD-10-CM | POA: Diagnosis not present

## 2023-02-17 DIAGNOSIS — H52203 Unspecified astigmatism, bilateral: Secondary | ICD-10-CM | POA: Diagnosis not present

## 2023-02-17 DIAGNOSIS — D485 Neoplasm of uncertain behavior of skin: Secondary | ICD-10-CM | POA: Diagnosis not present

## 2023-02-17 DIAGNOSIS — L821 Other seborrheic keratosis: Secondary | ICD-10-CM | POA: Diagnosis not present

## 2023-03-12 ENCOUNTER — Encounter (HOSPITAL_BASED_OUTPATIENT_CLINIC_OR_DEPARTMENT_OTHER): Payer: Self-pay | Admitting: Pharmacist

## 2023-03-12 ENCOUNTER — Other Ambulatory Visit: Payer: Self-pay

## 2023-03-12 ENCOUNTER — Other Ambulatory Visit (HOSPITAL_BASED_OUTPATIENT_CLINIC_OR_DEPARTMENT_OTHER): Payer: Self-pay

## 2023-03-12 MED ORDER — SERTRALINE HCL 100 MG PO TABS
100.0000 mg | ORAL_TABLET | Freq: Every day | ORAL | 1 refills | Status: AC
  Filled 2023-03-12 – 2023-08-27 (×3): qty 90, 90d supply, fill #0

## 2023-03-12 MED ORDER — CLONAZEPAM 1 MG PO TABS
1.0000 mg | ORAL_TABLET | Freq: Two times a day (BID) | ORAL | 0 refills | Status: DC | PRN
  Filled 2023-03-12: qty 60, 30d supply, fill #0

## 2023-03-12 MED ORDER — ZEPBOUND 15 MG/0.5ML ~~LOC~~ SOAJ
15.0000 mg | SUBCUTANEOUS | 0 refills | Status: DC
  Filled 2023-03-12: qty 2, 28d supply, fill #0

## 2023-03-15 ENCOUNTER — Other Ambulatory Visit (HOSPITAL_BASED_OUTPATIENT_CLINIC_OR_DEPARTMENT_OTHER): Payer: Self-pay

## 2023-03-17 DIAGNOSIS — L814 Other melanin hyperpigmentation: Secondary | ICD-10-CM | POA: Diagnosis not present

## 2023-03-17 DIAGNOSIS — D485 Neoplasm of uncertain behavior of skin: Secondary | ICD-10-CM | POA: Diagnosis not present

## 2023-03-17 DIAGNOSIS — C44529 Squamous cell carcinoma of skin of other part of trunk: Secondary | ICD-10-CM | POA: Diagnosis not present

## 2023-03-17 DIAGNOSIS — L821 Other seborrheic keratosis: Secondary | ICD-10-CM | POA: Diagnosis not present

## 2023-03-17 DIAGNOSIS — D225 Melanocytic nevi of trunk: Secondary | ICD-10-CM | POA: Diagnosis not present

## 2023-03-26 ENCOUNTER — Other Ambulatory Visit: Payer: Self-pay

## 2023-03-26 ENCOUNTER — Encounter: Payer: Self-pay | Admitting: Hematology and Oncology

## 2023-03-26 DIAGNOSIS — Z170 Estrogen receptor positive status [ER+]: Secondary | ICD-10-CM

## 2023-03-29 ENCOUNTER — Encounter: Payer: Self-pay | Admitting: Hematology and Oncology

## 2023-03-31 DIAGNOSIS — D045 Carcinoma in situ of skin of trunk: Secondary | ICD-10-CM | POA: Diagnosis not present

## 2023-04-06 NOTE — Telephone Encounter (Signed)
Called pt per MD to advise [facility] testing was negative/not detected. Pt verbalized understanding of results and knows [facility] will be in touch to schedule 3 mo repeat lab.   

## 2023-04-12 ENCOUNTER — Other Ambulatory Visit (HOSPITAL_BASED_OUTPATIENT_CLINIC_OR_DEPARTMENT_OTHER): Payer: Self-pay

## 2023-04-12 MED ORDER — ZEPBOUND 15 MG/0.5ML ~~LOC~~ SOAJ
15.0000 mg | SUBCUTANEOUS | 0 refills | Status: DC
  Filled 2023-04-12: qty 2, 28d supply, fill #0

## 2023-04-19 DIAGNOSIS — Z7689 Persons encountering health services in other specified circumstances: Secondary | ICD-10-CM | POA: Diagnosis not present

## 2023-04-19 DIAGNOSIS — E237 Disorder of pituitary gland, unspecified: Secondary | ICD-10-CM | POA: Diagnosis not present

## 2023-04-19 DIAGNOSIS — C4492 Squamous cell carcinoma of skin, unspecified: Secondary | ICD-10-CM | POA: Diagnosis not present

## 2023-04-19 DIAGNOSIS — E041 Nontoxic single thyroid nodule: Secondary | ICD-10-CM | POA: Diagnosis not present

## 2023-04-20 ENCOUNTER — Encounter: Payer: Self-pay | Admitting: Adult Health

## 2023-04-20 ENCOUNTER — Ambulatory Visit (HOSPITAL_COMMUNITY)
Admission: RE | Admit: 2023-04-20 | Discharge: 2023-04-20 | Disposition: A | Discharge status: Home / Self Care | Payer: BC Managed Care – PPO | Source: Ambulatory Visit | Attending: Adult Health | Admitting: Adult Health

## 2023-04-20 DIAGNOSIS — Z170 Estrogen receptor positive status [ER+]: Secondary | ICD-10-CM | POA: Insufficient documentation

## 2023-04-20 DIAGNOSIS — C50912 Malignant neoplasm of unspecified site of left female breast: Secondary | ICD-10-CM | POA: Diagnosis not present

## 2023-04-20 DIAGNOSIS — Z803 Family history of malignant neoplasm of breast: Secondary | ICD-10-CM | POA: Diagnosis not present

## 2023-04-20 DIAGNOSIS — C50412 Malignant neoplasm of upper-outer quadrant of left female breast: Secondary | ICD-10-CM | POA: Diagnosis not present

## 2023-04-20 MED ORDER — GADOBUTROL 1 MMOL/ML IV SOLN
9.0000 mL | Freq: Once | INTRAVENOUS | Status: AC | PRN
  Administered 2023-04-20: 9 mL via INTRAVENOUS

## 2023-04-21 ENCOUNTER — Telehealth: Payer: Self-pay

## 2023-04-21 NOTE — Telephone Encounter (Signed)
Called pt with below results.  Patient verbalized understanding and was happy with results.

## 2023-04-21 NOTE — Telephone Encounter (Signed)
-----   Message from [name] Filbert sent at 04/21/2023  8:54 AM EDT ----- Please call patient with good news that MRI is negative ----- Message ----- From: Interface, Rad Results In Sent: 04/20/2023  10:12 AM EDT To: Loa Socks, NP

## 2023-04-23 ENCOUNTER — Ambulatory Visit: Payer: BC Managed Care – PPO | Admitting: Adult Health

## 2023-05-03 DIAGNOSIS — H6501 Acute serous otitis media, right ear: Secondary | ICD-10-CM | POA: Diagnosis not present

## 2023-05-03 DIAGNOSIS — R55 Syncope and collapse: Secondary | ICD-10-CM | POA: Diagnosis not present

## 2023-05-03 DIAGNOSIS — R300 Dysuria: Secondary | ICD-10-CM | POA: Diagnosis not present

## 2023-05-03 DIAGNOSIS — R002 Palpitations: Secondary | ICD-10-CM | POA: Diagnosis not present

## 2023-05-04 ENCOUNTER — Encounter: Payer: Self-pay | Admitting: Adult Health

## 2023-05-04 ENCOUNTER — Inpatient Hospital Stay: Payer: BC Managed Care – PPO | Attending: Adult Health | Admitting: Adult Health

## 2023-05-04 VITALS — BP 98/52 | HR 54 | Temp 97.5°F | Resp 18 | Wt 153.5 lb

## 2023-05-04 DIAGNOSIS — R001 Bradycardia, unspecified: Secondary | ICD-10-CM | POA: Insufficient documentation

## 2023-05-04 DIAGNOSIS — Z803 Family history of malignant neoplasm of breast: Secondary | ICD-10-CM | POA: Diagnosis not present

## 2023-05-04 DIAGNOSIS — Z170 Estrogen receptor positive status [ER+]: Secondary | ICD-10-CM | POA: Insufficient documentation

## 2023-05-04 DIAGNOSIS — N941 Unspecified dyspareunia: Secondary | ICD-10-CM | POA: Diagnosis not present

## 2023-05-04 DIAGNOSIS — Z79811 Long term (current) use of aromatase inhibitors: Secondary | ICD-10-CM

## 2023-05-04 DIAGNOSIS — R079 Chest pain, unspecified: Secondary | ICD-10-CM | POA: Insufficient documentation

## 2023-05-04 DIAGNOSIS — C50412 Malignant neoplasm of upper-outer quadrant of left female breast: Secondary | ICD-10-CM | POA: Diagnosis not present

## 2023-05-04 DIAGNOSIS — Z8041 Family history of malignant neoplasm of ovary: Secondary | ICD-10-CM | POA: Diagnosis not present

## 2023-05-04 DIAGNOSIS — R1031 Right lower quadrant pain: Secondary | ICD-10-CM | POA: Insufficient documentation

## 2023-05-04 DIAGNOSIS — R011 Cardiac murmur, unspecified: Secondary | ICD-10-CM | POA: Diagnosis not present

## 2023-05-04 DIAGNOSIS — N898 Other specified noninflammatory disorders of vagina: Secondary | ICD-10-CM | POA: Diagnosis not present

## 2023-05-04 DIAGNOSIS — Z801 Family history of malignant neoplasm of trachea, bronchus and lung: Secondary | ICD-10-CM | POA: Diagnosis not present

## 2023-05-04 DIAGNOSIS — E559 Vitamin D deficiency, unspecified: Secondary | ICD-10-CM | POA: Insufficient documentation

## 2023-05-04 MED ORDER — ESTRADIOL 0.1 MG/GM VA CREA
1.0000 | TOPICAL_CREAM | VAGINAL | 12 refills | Status: AC
Start: 2023-05-05 — End: ?

## 2023-05-04 NOTE — Progress Notes (Signed)
[facility] Cancer Follow up:    Tracy Pagan, NP [address] [place] [address]   DIAGNOSIS:  Cancer Staging  Malignant neoplasm of upper-outer quadrant of left breast in female, estrogen receptor positive (HCC) Staging form: Breast, AJCC 8th Edition - Clinical stage from 08/21/2020: Stage IIA (cT1c, cN1, cM0, G3, ER+, PR+, HER2-) - Signed by Serena Croissant, MD on 08/21/2020 - Pathologic stage from 02/26/2021: No Stage Recommended (ypT0, pN0, cM0) - Signed by Loa Socks, NP on 12/04/2021 Stage prefix: Post-therapy   SUMMARY OF ONCOLOGIC HISTORY: Oncology History  Malignant neoplasm of upper-outer quadrant of left breast in female, estrogen receptor positive (HCC)  08/21/2020 Initial Diagnosis   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%, PR 95%, HER2 equivocal, FISH pending, Ki-67 20%   08/21/2020 Cancer Staging   Staging form: Breast, AJCC 8th Edition - Clinical stage from 08/21/2020: Stage IIA (cT1c, cN1, cM0, G3, ER+, PR+, HER2-) - Signed by Serena Croissant, MD on 08/21/2020   09/04/2020 Genetic Testing   Negative genetic testing. No pathogenic variants identified on the [facility] Breast Cancer STAT Panel + Multi-Cancer Panel. VUS in [facility] called c.983C>T identified. The report date is 09/04/2020.   The STAT Breast cancer panel offered by [facility] includes sequencing and rearrangement analysis for the following 9 genes:  ATM, BRCA1, BRCA2, CDH1, CHEK2, PALB2, PTEN, STK11 and TP53.    The Multi-Cancer Panel offered by [facility] includes sequencing and/or deletion duplication testing of the following 85 genes: AIP, ALK, APC, ATM, AXIN2,BAP1,  BARD1, BLM, BMPR1A,  BRCA1, BRCA2, BRIP1, CASR, CDC73, CDH1, CDK4, CDKN1B, CDKN1C, CDKN2A (p14ARF), CDKN2A (p16INK4a), CEBPA, CHEK2, CTNNA1, DICER1, DIS3L2, EGFR (c.2369C>T, p.Thr790Met variant only), EPCAM (Deletion/duplication testing only), FH, FLCN, GATA2, GPC3, GREM1 (Promoter region deletion/duplication testing only), HOXB13 (c.251G>A, p.Gly84Glu), HRAS, KIT, MAX, MEN1, MET, MITF (c.952G>A, p.Glu318Lys variant only), MLH1, MSH2, MSH3, MSH6, MUTYH, NBN, NF1, NF2, NTHL1, PALB2, PDGFRA, PHOX2B, PMS2, POLD1, POLE, POT1, PRKAR1A, PTCH1, PTEN, RAD50, RAD51C, RAD51D, RB1, RECQL4, RET, RNF43, RUNX1, SDHAF2, SDHA (sequence changes only), SDHB, SDHC, SDHD, SMAD4, SMARCA4, SMARCB1, SMARCE1, STK11, SUFU, TERC, TERT, TMEM127, TP53, TSC1, TSC2, VHL, WRN and WT1.    09/06/2020 - 01/17/2021 Chemotherapy   AC X 4 foll by Taxol       02/26/2021 Surgery   Left lumpectomy: no residual carcinoma with margins and axillary lymph nodes also negative for carcinoma     02/26/2021 Cancer Staging   Staging form: Breast, AJCC 8th Edition - Pathologic stage from 02/26/2021: No Stage Recommended (ypT0, pN0, cM0) - Signed by Loa Socks, NP on 12/04/2021 Stage prefix: Post-therapy   04/18/2021 Surgery   Removal of retained clip, port removal and bilateral breast mammoplasty    05/27/2021 - 07/10/2021 Radiation Therapy    Site Technique Total Dose (Gy) Dose per Fx (Gy) Completed Fx Beam Energies  Breast, Left: Breast_Lt 3D 50.4/50.4 1.8 28/28 6X, 10X  Axilla, Left: Axilla_Lt 3D 50.4/50.4 1.8 28/28 6X, 15X     08/2021 -  Anti-estrogen oral therapy   Unable to tolerate tamoxifen or letrozole, changed to exemestane daily 09/18/21     CURRENT THERAPY: Observation  INTERVAL HISTORY: Tracy Dyer 52 y.o. female returns for f/u of her breast cancer.  She  is hypotensive today and was seen by her PCP yesterday for presyncope.  She has been experiencing chest pain, fatigue, and increased shortness of breath for several weeks.  [place] and had an episode of heaviness in her ear, she was standing with colleagues on the street and got to where she was unsure where she was. She had some confusion, and then felt like she might blackout or puke, and almost passed out.  People around her thought she may have experienced a stroke.  She sat down and drank some water.  Later that day she tested positive for COVID.  All this happened about 8 days ago.  She went to her PCP yesterday who referred her to cardiology due to her bradycardia.  After her visit completion Tracy Dyer remembered how she has noticed during the middle of the night she has experienced chest pain she thought was a panic attack.  She is worried about her heart and if anything might be wrong.    She also notes vaginal dryness and dyspareunia.  She wants to know if she can receive vaginal Dyer cream.  Of note, she has lost about 50-60 pounds intentionally over the past few months.  She has bene taking Zepbound.  She is exercising and lifting weights at the gym as well.    Patient Active Problem List   Diagnosis Date Noted   Vitamin D deficiency 05/04/2023   Right lower quadrant pain 05/04/2023   Squamous cell skin cancer 04/19/2023   Cervical radiculopathy 07/08/2022   Occipital neuralgia of right side 07/01/2021   Peripheral vertigo 07/01/2021   Genetic testing 09/04/2020   Family history of breast cancer    Family history of ovarian cancer    Family history of lung cancer    Malignant neoplasm of upper-outer quadrant of left breast in female, estrogen receptor positive (HCC) 08/21/2020   Headache 04/10/2017   Anxiety 11/16/2013   Hypercoagulable state (HCC) 11/16/2013   TIA (transient ischemic attack) 11/15/2013   Chest discomfort 01/20/2012   Factor V Leiden (HCC) 01/20/2012   MVP (mitral valve prolapse) 01/20/2012   Acute bronchitis 12/23/2011    is allergic to sulfa antibiotics, propoxyphene, and sulfamethoxazole.  MEDICAL HISTORY: Past  Medical History:  Diagnosis Date   Anxiety    Cancer (HCC)    breast   Depression    Family history of breast cancer    Family history of lung cancer    Family history of ovarian cancer    GERD (gastroesophageal reflux disease)    diet controlled   History of radiation therapy    Left breast, left axilla- 05/27/21-07/10/21- Dr. Antony Blackbird   Seasonal allergies     SURGICAL HISTORY: Past Surgical History:  Procedure Laterality Date   BREAST BIOPSY Left 2019   x 2 biopsy   BREAST EXCISIONAL BIOPSY Left    BREAST LUMPECTOMY WITH RADIOACTIVE SEED AND SENTINEL LYMPH NODE BIOPSY Left 02/26/2021   Procedure: LEFT BREAST LUMPECTOMY WITH RADIOACTIVE SEED x2; LEFT AXILLARY SENTINEL NODE BIOPSY;  Surgeon: Emelia Loron, MD;  Location: [facility];  Service: General;  Laterality: Left;   BREAST LUMPECTOMY WITH RADIOFREQUENCY TAG IDENTIFICATION Left 04/18/2021   Procedure: LEFT BREAST MRI WIRE GUIDED EXCISION;  Surgeon: Emelia Loron, MD;  Location: [facility];  Service: General;  Laterality: Left;   BREAST REDUCTION SURGERY Bilateral 04/18/2021   Procedure: MAMMARY REDUCTION  (BREAST) LEFT ONCOPLASTIC BREAST RECONSTRUCTION, RIGHT BREAST REDUCTION;  Surgeon: Glenna Fellows,  MD;  Location: [facility];  Service: Plastics;  Laterality: Bilateral;   CHOLECYSTECTOMY  2008   PORT-A-CATH REMOVAL Right 04/18/2021   Procedure: REMOVAL PORT-A-CATH;  Surgeon: Emelia Loron, MD;  Location:  SURGERY CENTER;  Service: General;  Laterality: Right;   PORTACATH PLACEMENT N/A 09/05/2020   Procedure: INSERTION PORT-A-CATH;  Surgeon: Emelia Loron, MD;  Location: [facility];  Service: General;  Laterality: N/A;  PEC BLOCK   WISDOM TOOTH EXTRACTION      SOCIAL HISTORY: Social History   Socioeconomic History   Marital status: Married    Spouse name: Not on file   Number of children: Not on file   Years of education: Not on file   Highest  education level: Not on file  Occupational History   Not on file  Tobacco Use   Smoking status: Never   Smokeless tobacco: Never  Vaping Use   Vaping status: Never Used  Substance and Sexual Activity   Alcohol use: Not Currently   Drug use: Never   Sexual activity: Yes    Birth control/protection: None  Other Topics Concern   Not on file  Social History Narrative   Not on file   Social Determinants of Health   Financial Resource Strain: Patient Declined (08/14/2022)   Received from [facility], [facility]   Overall Financial Resource Strain (CARDIA)    Difficulty of Paying Living Expenses: Patient declined  Food Insecurity: Patient Declined (04/18/2023)   Received from [facility]   Hunger Vital Sign    Worried About Running Out of Food in the Last Year: Patient declined    Ran Out of Food in the Last Year: Patient declined  Transportation Needs: Patient Declined (04/18/2023)   Received from [facility] - Transportation    Lack of Transportation (Medical): Patient declined    Lack of Transportation (Non-Medical): Patient declined  Physical Activity: Unknown (04/18/2023)   Received from [facility]   Exercise Vital Sign    Days of Exercise per Week: Patient declined    Minutes of Exercise per Session: 80 min  Stress: Patient Declined (04/18/2023)   Received from [facility] - Occupational Stress Questionnaire    Feeling of Stress : Patient declined  Social Connections: Moderately Integrated (04/18/2023)   Received from [facility]   Social Network    How would you rate your social network (family, work, friends)?: Adequate participation with social networks  Intimate Partner Violence: Not At Risk (04/18/2023)   Received from [facility]   HITS    Over the last 12 months how often did your partner physically hurt you?: 1    Over the last 12 months how often did your partner insult you or talk down to you?:  1    Over the last 12 months how often did your partner threaten you with physical harm?: 1    Over the last 12 months how often did your partner scream or curse at you?: 1    FAMILY HISTORY: Family History  Problem Relation Age of Onset   Eczema Father    Allergic rhinitis Brother    Breast cancer Mother        in 54's   Cancer Maternal Uncle        unk type   Lung cancer Maternal Grandfather 42   Heart Problems Paternal Grandfather    Melanoma Cousin    Ovarian cancer Paternal Great-grandmother  Cancer Paternal Great-grandmother        GYN cancer, possibly ovarian?   Cancer Maternal Aunt        half aunts/uncles x5- rare cancers    Review of Systems  Constitutional:  Positive for fatigue. Negative for appetite change, chills, fever and unexpected weight change.  HENT:   Negative for hearing loss, lump/mass, mouth sores and trouble swallowing.   Eyes:  Negative for eye problems and icterus.  Respiratory:  Positive for shortness of breath. Negative for chest tightness and cough.   Cardiovascular:  Positive for chest pain and palpitations. Negative for leg swelling.  Gastrointestinal:  Negative for abdominal distention, abdominal pain, constipation, diarrhea, nausea and vomiting.  Endocrine: Negative for hot flashes.  Genitourinary:  Negative for difficulty urinating.   Musculoskeletal:  Negative for arthralgias.  Skin:  Negative for itching and rash.  Neurological:  Negative for dizziness, extremity weakness, headaches and numbness.  Hematological:  Negative for adenopathy. Does not bruise/bleed easily.  Psychiatric/Behavioral:  Negative for depression. The patient is not nervous/anxious.       PHYSICAL EXAMINATION    Vitals:   05/04/23 1202  BP: (!) 98/52  Pulse: (!) 54  Resp: 18  Temp: (!) 97.5 F (36.4 C)  SpO2: 100%    Physical Exam Constitutional:      General: She is not in acute distress.    Appearance: Normal appearance. She is ill-appearing  (patient appears tired). She is not toxic-appearing.  HENT:     Head: Normocephalic and atraumatic.     Mouth/Throat:     Mouth: Mucous membranes are moist.     Pharynx: Oropharynx is clear. No oropharyngeal exudate or posterior oropharyngeal erythema.  Eyes:     General: No scleral icterus. Cardiovascular:     Rate and Rhythm: Regular rhythm. Bradycardia present.     Pulses: Normal pulses.     Heart sounds: Murmur heard.  Pulmonary:     Effort: Pulmonary effort is normal.     Breath sounds: Normal breath sounds.  Abdominal:     General: Abdomen is flat. Bowel sounds are normal. There is no distension.     Palpations: Abdomen is soft.     Tenderness: There is no abdominal tenderness.  Musculoskeletal:        General: No swelling.     Cervical back: Neck supple.  Lymphadenopathy:     Cervical: No cervical adenopathy.  Skin:    General: Skin is warm and dry.     Findings: No rash.  Neurological:     General: No focal deficit present.     Mental Status: She is alert.  Psychiatric:        Mood and Affect: Mood normal.        Behavior: Behavior normal.    ASSESSMENT and THERAPY PLAN:   Malignant neoplasm of upper-outer quadrant of left breast in female, estrogen receptor positive (HCC) 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  Neoadjuvant chemotherapy with  dose dense Adriamycin and Cytoxan followed by Taxol weekly  x12 completed 01/17/2021 2. 02/26/2021 breast conserving surgery with sentinel lymph node and targeted node dissection,: Path CR 0/4 LN Neg 04/18/21: Bilateral Mammoplasty: Benign 3.  Adjuvant radiation therapy 05/28/21- 07/10/21 4.  Followed by adjuvant antiestrogen therapy with tamoxifen--unable to tolerate, on Letrozole daily switched to exemestane discontinued for intolerance -------------------------------------------------------------------------------------------------------------------------  PET CT 10/01/21: No evidence of metastatic disease postradiation changes in the lungs Based on severe intolerance antiestrogen therapy we decided not to pursue any further antiestrogen treatments. [facility]a testing 03/28/2023 negative   Breast cancer surveillance: [facility] testing Mammogram alternating with breast MRI  Vaginal dryness and Dyspareunia: I prescribed Tracy Dyer cream to insert vaginally three times a week.  I informed her that theoretically it is not systemically absorbed, however there is no concrete data to support for or against this fact.  Tracy Dyer understands this and wants to proceed with receiving this cream which I sent in for her.    Bradycardia, heart murmur, chest pain: Tracy Dyer's heart murmur sounds worse than prior and her symptoms are atypical.  Considering her having received Adriamycin and left breast radiation, I placed orders for an ASAP echocardiogram.  I also talked with Dr. Carolan Clines and reviewed my concerns with her about [place].  Dr. Carolan Clines is willing to see Tracy Dyer this week and we will have her echocardiogram results by then so she can review and determine next steps.  I instructed Tracy Dyer to go to the ER should she worsen between now and then.  I also instructed that Tracy Dyer should not exercise until and unless she is cleared to do so by cardiology.   I also suggested she stop taking her supplements, in particular the "nutrafol" while we evaluate what is going on in her  heart.  RTC in 4 weeks for labs and f/u.    All questions were answered. The patient knows to call the clinic with any problems, questions or concerns. We can certainly see the patient much sooner if necessary.  Total encounter time:60 minutes*in face-to-face visit time, chart review, lab review, care coordination, order entry, and documentation of the encounter time.    Lillard Anes, NP 05/08/23 1:54 AM Medical Oncology and Hematology [facility] [address] [place], [place] [address] Tel. [phone]    Fax. [phone]  *Total Encounter Time as defined by the Centers for Medicare and Medicaid Services includes, in addition to the face-to-face time of a patient visit (documented in the note above) non-face-to-face time: obtaining and reviewing outside history, ordering and reviewing medications, tests or procedures, care coordination (communications with other health care professionals or caregivers) and documentation in the medical record.

## 2023-05-05 ENCOUNTER — Ambulatory Visit (HOSPITAL_COMMUNITY): Payer: BC Managed Care – PPO | Attending: Internal Medicine

## 2023-05-05 DIAGNOSIS — R011 Cardiac murmur, unspecified: Secondary | ICD-10-CM | POA: Diagnosis not present

## 2023-05-05 DIAGNOSIS — Z170 Estrogen receptor positive status [ER+]: Secondary | ICD-10-CM | POA: Insufficient documentation

## 2023-05-05 DIAGNOSIS — C50412 Malignant neoplasm of upper-outer quadrant of left female breast: Secondary | ICD-10-CM | POA: Diagnosis not present

## 2023-05-05 LAB — ECHOCARDIOGRAM COMPLETE
Area-P 1/2: 2.74 cm2
MV M vel: 5.01 m/s
MV Peak grad: 100.4 mm[Hg]
Radius: 1 cm
S' Lateral: 2.4 cm

## 2023-05-06 NOTE — Progress Notes (Unsigned)
Cardiology Office Note:  .   Date:  05/06/2023  ID:  Abigale Kapela, DOB 03-28-1971, MRN [id number] PCP: Jettie Pagan, NP  [facility] Providers Cardiologist:  None { Click to update primary MD,subspecialty MD or APP then REFRESH:1}   History of Present Illness: .   Ajane Escutia is a 52 y.o. female hx of stroke at 52y/o  MTHFR associated with birth control, patient of Lillard Anes.  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 S/p AC. She has hx of MVP with a myxomatous mitral valve. She had mild MR in August [address]2536. Otherwise normal heart function. She was seen by Mardella Layman recently and she noted a more pronounced murmur. Mrs. Prisco was also having palpitations and was very concerned. She underwent a TTE and her MR has progressed and she may have flail leaflet. Today, she said she was in [place] for work. She had an acute episode of feeling nauseous. She has felt disoriented. She was diagnosed with COVID. She said her chest feels heavy. She notes heart racing at night. No PND/orthopnea  Cardio-Onc Hx - Anthracycline therapy - Y dose 220 mg/m2 - Herceptin- N - Radiation L breast - Gy 50 Gy, unknown MHD - ICI- N - TKI/VEGF- N -5 FU- N   ROS:  per HPI otherwise negative   Studies Reviewed: Marland Kitchen       EKG Interpretation Date/Time:  Friday May 07 2023 09:17:03 EDT Ventricular Rate:  62 PR Interval:  168 QRS Duration:  98 QT Interval:  440 QTC Calculation: 446 R Axis:   0  Text Interpretation: Normal sinus rhythm Normal ECG When compared with ECG of 04-May-2023 12:21, No significant change was found Confirmed by Carolan Clines ([id number]) on 05/07/2023 9:19:20 AM   TTE 05/05/2023 Normal LV/RV function No pulmonary HTN  Left atrial size was moderately dilated.   Native mitral valve is myxomatous, with posterior leaflet prolapse (but flail component cannot be ruled out image 70 and 73), mild to moderate mitral valve regurgitation directed anteriorly  and late peaking, no evidence of mitral stenosis.  Aortic Valve is normal  6. The inferior vena cava is normal in size with <50% respiratory  variability, suggesting right atrial pressure of 8 mmHg.   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   MVP with moderate MR; posterior leaflet may have flail. - will plan for TEE for further evaluation, and likely CT SUR referral post. Platelets are normal. We discussed this.  Cardio-Oncology CVD Risk: has MVP Cardiotoxic Risk: Low Potential Cardiotoxic therapy: AC, < 240 mg/m2. Did have L breast xrt, however, likely MHD low with cardioprotective measures. Don't think this is correlated with  worsening MVP   Surveillance:Clinical  Follow up      Informed Consent   Shared Decision Making/Informed Consent{ All outpatient stress tests require an informed consent ([id number]) ATTESTATION ORDER       :[id number]} The risks [esophageal damage, perforation (1:10,000 risk), bleeding, pharyngeal hematoma as well as other potential complications associated with conscious sedation including aspiration, arrhythmia, respiratory failure and death], benefits (treatment guidance and diagnostic support) and alternatives of a transesophageal echocardiogram were discussed in  detail with Ms. Rosebrough and she is willing to proceed.      Dispo: ***  Signed, Maisie Fus, MD

## 2023-05-06 NOTE — H&P (View-Only) (Signed)
Cardiology Office Note:  .   Date:  05/06/2023  ID:  Tracy Dyer, DOB 03-28-1971, MRN [id number] PCP: Jettie Pagan, NP  [facility] Providers Cardiologist:  None { Click to update primary MD,subspecialty MD or APP then REFRESH:1}   History of Present Illness: .   Tracy Dyer is a 52 y.o. female hx of stroke at 52y/o  MTHFR associated with birth control, patient of Lillard Anes.  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 S/p AC. She has hx of MVP with a myxomatous mitral valve. She had mild MR in August [address]36. Otherwise normal heart function. She was seen by Mardella Layman recently and she noted a more pronounced murmur. Tracy Dyer was also having palpitations and was very concerned. She underwent a TTE and her MR has progressed and she may have flail leaflet. Today, she said she was in [place] for work. She had an acute episode of feeling nauseous. She has felt disoriented. She was diagnosed with COVID. She said her chest feels heavy. She notes heart racing at night. No PND/orthopnea  Cardio-Onc Hx - Anthracycline therapy - Y dose 220 mg/m2 - Herceptin- N - Radiation L breast - Gy 50 Gy, unknown MHD - ICI- N - TKI/VEGF- N -5 FU- N   ROS:  per HPI otherwise negative   Studies Reviewed: Marland Kitchen       EKG Interpretation Date/Time:  Friday May 07 2023 09:17:03 EDT Ventricular Rate:  62 PR Interval:  168 QRS Duration:  98 QT Interval:  440 QTC Calculation: 446 R Axis:   0  Text Interpretation: Normal sinus rhythm Normal ECG When compared with ECG of 04-May-2023 12:21, No significant change was found Confirmed by Carolan Clines ([id number]) on 05/07/2023 9:19:20 AM   TTE 05/05/2023 Normal LV/RV function No pulmonary HTN  Left atrial size was moderately dilated.   Native mitral valve is myxomatous, with posterior leaflet prolapse (but flail component cannot be ruled out image 70 and 73), mild to moderate mitral valve regurgitation directed anteriorly  and late peaking, no evidence of mitral stenosis.  Aortic Valve is normal  6. The inferior vena cava is normal in size with <50% respiratory  variability, suggesting right atrial pressure of 8 mmHg.   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   MVP with moderate MR; posterior leaflet may have flail. - will plan for TEE for further evaluation, and likely CT SUR referral post. Platelets are normal. We discussed this.  Cardio-Oncology CVD Risk: has MVP Cardiotoxic Risk: Low Potential Cardiotoxic therapy: AC, < 240 mg/m2. Did have L breast xrt, however, likely MHD low with cardioprotective measures. Don't think this is correlated with  worsening MVP   Surveillance:Clinical  Follow up      Informed Consent   Shared Decision Making/Informed Consent{ All outpatient stress tests require an informed consent ([id number]) ATTESTATION ORDER       :[id number]} The risks [esophageal damage, perforation (1:10,000 risk), bleeding, pharyngeal hematoma as well as other potential complications associated with conscious sedation including aspiration, arrhythmia, respiratory failure and death], benefits (treatment guidance and diagnostic support) and alternatives of a transesophageal echocardiogram were discussed in  detail with Tracy Dyer and she is willing to proceed.      Dispo: ***  Signed, Maisie Fus, MD

## 2023-05-07 ENCOUNTER — Ambulatory Visit: Payer: BC Managed Care – PPO | Attending: Internal Medicine | Admitting: Internal Medicine

## 2023-05-07 ENCOUNTER — Encounter: Payer: Self-pay | Admitting: Internal Medicine

## 2023-05-07 VITALS — BP 96/56 | HR 62 | Ht 66.0 in | Wt 150.6 lb

## 2023-05-07 DIAGNOSIS — G459 Transient cerebral ischemic attack, unspecified: Secondary | ICD-10-CM | POA: Diagnosis not present

## 2023-05-07 DIAGNOSIS — I341 Nonrheumatic mitral (valve) prolapse: Secondary | ICD-10-CM | POA: Diagnosis not present

## 2023-05-07 DIAGNOSIS — Z01812 Encounter for preprocedural laboratory examination: Secondary | ICD-10-CM | POA: Diagnosis not present

## 2023-05-07 NOTE — Patient Instructions (Signed)
Medication Instructions:  Your physician recommends that you continue on your current medications as directed. Please refer to the Current Medication list given to you today.    *If you need a refill on your cardiac medications before your next appointment, please call your pharmacy*   Lab Work: - BMP -CBC   If you have labs (blood work) drawn today and your tests are completely normal, you will receive your results only by: MyChart Message (if you have MyChart) OR A paper copy in the mail If you have any lab test that is abnormal or we need to change your treatment, we will call you to review the results.   Testing/Procedures: Your physician has requested that you have a TEE. During a TEE, sound waves are used to create images of your heart. It provides your doctor with information about the size and shape of your heart and how well your heart's chambers and valves are working. In this test, a transducer is attached to the end of a flexible tube that's guided down your throat and into your esophagus (the tube leading from you mouth to your stomach) to get a more detailed image of your heart. You are not awake for the procedure. Please see the instruction sheet given to you today. For further information please visit [email or web address]     Follow-Up: At [facility], you and your health needs are our priority.  As part of our continuing mission to provide you with exceptional heart care, we have created designated Provider Care Teams.  These Care Teams include your primary Cardiologist (physician) and Advanced Practice Providers (APPs -  Physician Assistants and Nurse Practitioners) who all work together to provide you with the care you need, when you need it.  We recommend signing up for the patient portal called "MyChart".  Sign up information is provided on this After Visit Summary.  MyChart is used to connect with patients for Virtual Visits (Telemedicine).  Patients are able to  view lab/test results, encounter notes, upcoming appointments, etc.  Non-urgent messages can be sent to your provider as well.   To learn more about what you can do with MyChart, go to [email or web address].    Your next appointment:   3 month(s)  The format for your next appointment:   In Person  Provider:   Maisie Fus, MD    Other Instructions     Dear Tracy Dyer  You are scheduled for a TEE (Transesophageal Echocardiogram) on Friday, October 11 with Dr. Wyline Mood.  Please arrive at the [facility] (Main Entrance A) at [facility]: [address] [place], [place] [address] at 7:30 AM (This time is 1 hour(s) before your procedure to ensure your preparation). Free valet parking service is available. You will check in at ADMITTING. The support person will be asked to wait in the waiting room.  It is OK to have someone drop you off and come back when you are ready to be discharged.      DIET:  Nothing to eat or drink after midnight except a sip of water with medications (see medication instructions below)  MEDICATION INSTRUCTIONS: !!IF ANY NEW MEDICATIONS ARE STARTED AFTER TODAY, PLEASE NOTIFY YOUR PROVIDER AS SOON AS POSSIBLE!!  FYI: Medications such as Semaglutide (Ozempic, [place]), Tirzepatide (Mounjaro, Zepbound), Dulaglutide (Trulicity), etc ("GLP1 agonists") AND Canagliflozin (Invokana), Dapagliflozin (Farxiga), Empagliflozin (Jardiance), Ertugliflozin (Steglatro), Bexagliflozin [employer]) or any combination with one of these drugs such as Invokamet (Canagliflozin/Metformin), Synjardy (Empagliflozin/Metformin), etc ("SGLT2 inhibitors") must be  held around the time of a procedure. This is not a comprehensive list of all of these drugs. Please review all of your medications and talk to your provider if you take any one of these. If you are not sure, ask your provider.  HOLD: Tirzepatide (Mounjaro, Zepbound) for 7 day prior to the procedure. Last dose on Friday, October 04      LABS:     FYI:  For your safety, and to allow [place] to monitor your vital signs accurately during the surgery/procedure we request: If you have artificial nails, gel coating, SNS etc, please have those removed prior to your surgery/procedure. Not having the nail coverings /polish removed may result in cancellation or delay of your surgery/procedure.  You must have a responsible person to drive you home and stay in the waiting area during your procedure. Failure to do so could result in cancellation.  Bring your insurance cards.  *Special Note: Every effort is made to have your procedure done on time. Occasionally there are emergencies that occur at the hospital that may cause delays. Please be patient if a delay does occur.

## 2023-05-08 LAB — BASIC METABOLIC PANEL
BUN/Creatinine Ratio: 16 (ref 9–23)
BUN: 11 mg/dL (ref 6–24)
CO2: 25 mmol/L (ref 20–29)
Calcium: 9.2 mg/dL (ref 8.7–10.2)
Chloride: 104 mmol/L (ref 96–106)
Creatinine, Ser: 0.7 mg/dL (ref 0.57–1.00)
Glucose: 78 mg/dL (ref 70–99)
Potassium: 5 mmol/L (ref 3.5–5.2)
Sodium: 139 mmol/L (ref 134–144)
eGFR: 104 mL/min/{1.73_m2} (ref >59)

## 2023-05-08 LAB — CBC
Hematocrit: 43.9 % (ref 34.0–46.6)
Hemoglobin: 14.7 g/dL (ref 11.1–15.9)
MCH: 31.7 pg (ref 26.6–33.0)
MCHC: 33.5 g/dL (ref 31.5–35.7)
MCV: 95 fL (ref 79–97)
Platelets: 234 10*3/uL (ref 150–450)
RBC: 4.63 x10E6/uL (ref 3.77–5.28)
RDW: 13 % (ref 11.7–15.4)
WBC: 4 10*3/uL (ref 3.4–10.8)

## 2023-05-08 NOTE — Assessment & Plan Note (Addendum)
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  Neoadjuvant chemotherapy with dose dense Adriamycin and Cytoxan followed by Taxol weekly x12 completed 01/17/2021 2. 02/26/2021 breast conserving surgery with sentinel lymph node and targeted node dissection,: Path CR 0/4 LN Neg 04/18/21: Bilateral Mammoplasty: Benign 3.  Adjuvant radiation therapy 05/28/21- 07/10/21 4.  Followed by adjuvant antiestrogen therapy with tamoxifen--unable to tolerate, on Letrozole daily switched to exemestane discontinued for intolerance -------------------------------------------------------------------------------------------------------------------------  PET CT 10/01/21: No evidence of metastatic disease postradiation changes in the lungs Based on severe intolerance antiestrogen therapy we decided not to pursue any further antiestrogen treatments. Signatera testing 03/28/2023 negative   Breast cancer surveillance: Signatera testing Mammogram alternating with breast MRI  Vaginal dryness and Dyspareunia: I prescribed Josepha Estrace cream to insert vaginally three times a week.  I informed her that theoretically it is not systemically absorbed, however there is no concrete data to support for or against this fact.  Madine understands this and wants to proceed with receiving this cream which I sent in for her.    Bradycardia, heart murmur, chest pain: Britnee's heart  murmur sounds worse than prior and her symptoms are atypical.  Considering her having received Adriamycin and left breast radiation, I placed orders for an ASAP echocardiogram.  I also talked with Dr. Carolan Clines and reviewed my concerns with her about Kalaysia.  Dr. Carolan Clines is willing to see Caitlyn this week and we will have her echocardiogram results by then so she can review and determine next steps.  I instructed Marzelle to go to the ER should she worsen between now and then.  I also instructed that Leida should not exercise until and unless she is cleared to do so by cardiology.   I also suggested she stop taking her supplements, in particular the "nutrafol" while we evaluate what is going on in her heart.  RTC in 4 weeks for labs and f/u.

## 2023-05-13 NOTE — OR Nursing (Signed)
Called patient with pre-procedure instructions for tomorrow.   Patient informed of:   Time to arrive for procedure. 0715 Remain NPO past midnight.  Must have a ride home and a responsible adult to remain with them for 24 ours post procedure.   Informed patient stopped all GLP-1s and GLP-2s for at least one week before procedure.   Left message for patient and instructed to call back with any questions.

## 2023-05-14 ENCOUNTER — Ambulatory Visit (HOSPITAL_COMMUNITY): Payer: BC Managed Care – PPO

## 2023-05-14 ENCOUNTER — Ambulatory Visit (HOSPITAL_COMMUNITY): Payer: BC Managed Care – PPO | Admitting: Anesthesiology

## 2023-05-14 ENCOUNTER — Encounter (HOSPITAL_COMMUNITY)
Admission: RE | Disposition: A | Discharge status: Home / Self Care | Payer: Self-pay | Source: Home / Self Care | Attending: Internal Medicine

## 2023-05-14 ENCOUNTER — Other Ambulatory Visit: Payer: Self-pay

## 2023-05-14 ENCOUNTER — Other Ambulatory Visit: Payer: Self-pay | Admitting: Internal Medicine

## 2023-05-14 ENCOUNTER — Ambulatory Visit (HOSPITAL_COMMUNITY)
Admission: RE | Admit: 2023-05-14 | Discharge: 2023-05-14 | Disposition: A | Discharge status: Home / Self Care | Payer: BC Managed Care – PPO | Attending: Internal Medicine | Admitting: Internal Medicine

## 2023-05-14 DIAGNOSIS — Z853 Personal history of malignant neoplasm of breast: Secondary | ICD-10-CM | POA: Diagnosis not present

## 2023-05-14 DIAGNOSIS — G459 Transient cerebral ischemic attack, unspecified: Secondary | ICD-10-CM | POA: Insufficient documentation

## 2023-05-14 DIAGNOSIS — I341 Nonrheumatic mitral (valve) prolapse: Secondary | ICD-10-CM

## 2023-05-14 DIAGNOSIS — I340 Nonrheumatic mitral (valve) insufficiency: Secondary | ICD-10-CM | POA: Diagnosis not present

## 2023-05-14 HISTORY — PX: TEE WITHOUT CARDIOVERSION: SHX5443

## 2023-05-14 LAB — ECHO TEE
MV M vel: 5.16 m/s
MV Peak grad: 106.5 mm[Hg]
Radius: 0.7 cm

## 2023-05-14 SURGERY — ECHOCARDIOGRAM, TRANSESOPHAGEAL
Anesthesia: Monitor Anesthesia Care

## 2023-05-14 MED ORDER — PROPOFOL 10 MG/ML IV BOLUS
INTRAVENOUS | Status: DC | PRN
Start: 2023-05-14 — End: 2023-05-14
  Administered 2023-05-14 (×4): 50 mg via INTRAVENOUS
  Administered 2023-05-14: 40 mg via INTRAVENOUS

## 2023-05-14 MED ORDER — SODIUM CHLORIDE 0.9 % IV SOLN: INTRAVENOUS | Status: DC

## 2023-05-14 MED ORDER — SODIUM CHLORIDE 0.9 % IV SOLN
INTRAVENOUS | Status: DC | PRN
Start: 2023-05-14 — End: 2023-05-14

## 2023-05-14 MED ORDER — LIDOCAINE 2% (20 MG/ML) 5 ML SYRINGE
INTRAMUSCULAR | Status: DC | PRN
  Administered 2023-05-14: 60 mg via INTRAVENOUS

## 2023-05-14 NOTE — Anesthesia Preprocedure Evaluation (Addendum)
Anesthesia Evaluation  Patient identified by MRN, date of birth, ID band Patient awake    Reviewed: Allergy & Precautions, NPO status , Patient's Chart, lab work & pertinent test results  History of Anesthesia Complications Negative for: history of anesthetic complications  Airway Mallampati: II  TM Distance: >3 FB Neck ROM: Full    Dental no notable dental hx.    Pulmonary neg pulmonary ROS   Pulmonary exam normal        Cardiovascular Normal cardiovascular exam  MVP   Neuro/Psych  Headaches  Anxiety Depression    TIA   GI/Hepatic Neg liver ROS,GERD  ,,  Endo/Other  negative endocrine ROS    Renal/GU negative Renal ROS  negative genitourinary   Musculoskeletal negative musculoskeletal ROS (+)    Abdominal   Peds  Hematology negative hematology ROS (+)   Anesthesia Other Findings Day of surgery medications reviewed with patient.  Reproductive/Obstetrics negative OB ROS                              Anesthesia Physical Anesthesia Plan  ASA: 3  Anesthesia Plan: MAC   Post-op Pain Management: Minimal or no pain anticipated   Induction:   PONV Risk Score and Plan: Treatment may vary due to age or medical condition and Propofol infusion  Airway Management Planned: Natural Airway and Nasal Cannula  Additional Equipment: None  Intra-op Plan:   Post-operative Plan:   Informed Consent: I have reviewed the patients History and Physical, chart, labs and discussed the procedure including the risks, benefits and alternatives for the proposed anesthesia with the patient or authorized representative who has indicated his/her understanding and acceptance.       Plan Discussed with: CRNA  Anesthesia Plan Comments:          Anesthesia Quick Evaluation

## 2023-05-14 NOTE — Anesthesia Procedure Notes (Signed)
Procedure Name: MAC Date/Time: 05/14/2023 8:35 AM  Performed by: Garfield Cornea, CRNAPre-anesthesia Checklist: Patient identified, Emergency Drugs available, Suction available and Patient being monitored Patient Re-evaluated:Patient Re-evaluated prior to induction Oxygen Delivery Method: Simple face mask Dental Injury: Teeth and Oropharynx as per pre-operative assessment

## 2023-05-14 NOTE — Discharge Instructions (Signed)
Transesophageal Echocardiogram Transesophageal echocardiogram (TEE) is a test that uses sound waves to take pictures of your heart. TEE is done by passing a small probe attached to a flexible tube down the part of the body that moves food from your mouth to your stomach (esophagus). The pictures give clear images of your heart. This can help your doctor see if there are problems with your heart. Tell a doctor about: Any allergies you have. All medicines you are taking. This includes vitamins, herbs, eye drops, creams, and over-the-counter medicines. Any problems you or family members have had with anesthetic medicines. Any blood disorders you have. Any surgeries you have had. Any medical conditions you have. Any swallowing problems. Whether you have or have had a blockage in the part of the body that moves food from your mouth to your stomach. Whether you are pregnant or may be pregnant. What are the risks? In general, this is a safe procedure. But, problems may occur, such as: Damage to nearby structures or organs. A tear in the part of the body that moves food from your mouth to your stomach. Irregular heartbeat. Hoarse voice or trouble swallowing. Bleeding. What happens before the procedure? Medicines Ask your doctor about changing or stopping: Your normal medicines. Vitamins, herbs, and supplements. Over-the-counter medicines. Do not take aspirin or ibuprofen unless you are told to. General instructions Follow instructions from your doctor about what you cannot eat or drink. You will take out any dentures or dental retainers. Plan to have a responsible adult take you home from the hospital or clinic. Plan to have a responsible adult care for you for the time you are told after you leave the hospital or clinic. This is important. What happens during the procedure?  An IV will be put into one of your veins. You may be given: A sedative. This medicine helps you relax. A medicine  to numb the back of your throat. This may be sprayed or gargled. Your blood pressure, heart rate, and breathing will be watched. You may be asked to lie on your left side. A bite block will be placed in your mouth. This keeps you from biting the tube. The tip of the probe will be placed into the back of your mouth. You will be asked to swallow. Your doctor will take pictures of your heart. The probe and bite block will be taken out after the test is done. The procedure may vary among doctors and hospitals. What can I expect after the procedure? You will be monitored until you leave the hospital or clinic. This includes checking your blood pressure, heart rate, breathing rate, and blood oxygen level. Your throat may feel sore and numb. This will get better over time. You will not be allowed to eat or drink until the numbness has gone away. It is common to have a sore throat for a day or two. It is up to you to get the results of your procedure. Ask how to get your results when they are ready. Follow these instructions at home: If you were given a sedative during your procedure, do not drive or use machines until your doctor says that it is safe. Return to your normal activities when your doctor says that it is safe. Keep all follow-up visits. Summary TEE is a test that uses sound waves to take pictures of your heart. You will be given a medicine to help you relax. Do not drive or use machines until your doctor says that it  is safe. This information is not intended to replace advice given to you by your health care provider. Make sure you discuss any questions you have with your health care provider. Document Revised: 04/02/2021 Document Reviewed: 03/12/2020 Elsevier Patient Education  2024 [employer].

## 2023-05-14 NOTE — Interval H&P Note (Signed)
History and Physical Interval Note:  05/14/2023 7:41 AM  Tracy Dyer  has presented today for surgery, with the diagnosis of MITRAL VALVE PROLAPSE,TIA.  The various methods of treatment have been discussed with the patient and family. After consideration of risks, benefits and other options for treatment, the patient has consented to  Procedure(s): TRANSESOPHAGEAL ECHOCARDIOGRAM (N/A) as a surgical intervention.  The patient's history has been reviewed, patient examined, no change in status, stable for surgery.  I have reviewed the patient's chart and labs.  Questions were answered to the patient's satisfaction.     Maisie Fus

## 2023-05-14 NOTE — CV Procedure (Signed)
INDICATIONS: Mitral valve prolapse   PROCEDURE:   Informed consent was obtained prior to the procedure. The risks, benefits and alternatives for the procedure were discussed and the patient comprehended these risks.  Risks include, but are not limited to, cough, sore throat, vomiting, nausea, somnolence, esophageal and stomach trauma or perforation, bleeding, low blood pressure, aspiration, pneumonia, infection, trauma to the teeth and death.    After a procedural time-out, the oropharynx was anesthetized with 20% benzocaine spray.   During this procedure the patient was administered propofol  to achieve and maintain moderate conscious sedation.  The patient's heart rate, blood pressure, and oxygen saturationweare monitored continuously during the procedure. The period of conscious sedation was 15 minutes, of which I was present face-to-face 100% of this time.  The transesophageal probe was inserted in the esophagus and stomach without difficulty and multiple views were obtained.  The patient was kept under observation until the patient left the procedure room.  The patient left the procedure room in stable condition.   Agitated microbubble saline contrast was not administered.  COMPLICATIONS:    There were no immediate complications.  FINDINGS:  Normal LV function Normal RV function Myxomatous mitral valve MVP, possible mitral annular dysfunction Moderate to severe MR No LAA  RECOMMENDATIONS:     CT surgery consult  Time Spent Directly with the Patient:  15 minutes   Maisie Fus 05/14/2023, 8:54 AM

## 2023-05-14 NOTE — Transfer of Care (Signed)
Immediate Anesthesia Transfer of Care Note  Patient: Tracy Dyer  Procedure(s) Performed: TRANSESOPHAGEAL ECHOCARDIOGRAM  Patient Location: PACU  Anesthesia Type:General  Level of Consciousness: awake and drowsy  Airway & Oxygen Therapy: Patient Spontanous Breathing and Patient connected to face mask oxygen  Post-op Assessment: Report given to RN and Post -op Vital signs reviewed and stable  Post vital signs: Reviewed and stable  Last Vitals:  Vitals Value Taken Time  BP 124/56 05/14/23 0856  Temp 36.5 C 05/14/23 0856  Pulse 67 05/14/23 0858  Resp 20 05/14/23 0858  SpO2 99 % 05/14/23 0858  Vitals shown include unfiled device data.  Last Pain:  Vitals:   05/14/23 0856  TempSrc: Temporal  PainSc: 0-No pain         Complications: No notable events documented.

## 2023-05-14 NOTE — Anesthesia Postprocedure Evaluation (Signed)
Anesthesia Post Note  Patient: Tracy Dyer  Procedure(s) Performed: TRANSESOPHAGEAL ECHOCARDIOGRAM     Patient location during evaluation: PACU Anesthesia Type: General Level of consciousness: awake and alert Pain management: pain level controlled Vital Signs Assessment: post-procedure vital signs reviewed and stable Respiratory status: spontaneous breathing, nonlabored ventilation and respiratory function stable Cardiovascular status: blood pressure returned to baseline Postop Assessment: no apparent nausea or vomiting Anesthetic complications: no   No notable events documented.  Last Vitals:  Vitals:   05/14/23 0915 05/14/23 0920  BP: 129/68 132/69  Pulse: 60 60  Resp: 15 12  Temp:    SpO2: 96% 98%    Last Pain:  Vitals:   05/14/23 0915  TempSrc:   PainSc: 0-No pain                 Shanda Howells

## 2023-05-17 ENCOUNTER — Encounter: Payer: Self-pay | Admitting: Internal Medicine

## 2023-05-17 ENCOUNTER — Encounter (HOSPITAL_COMMUNITY): Payer: Self-pay | Admitting: Internal Medicine

## 2023-05-18 ENCOUNTER — Telehealth: Payer: Self-pay

## 2023-05-18 NOTE — Telephone Encounter (Signed)
Called pt to set up visit with Dr. Servando Salina. No answer, left message as well as sent a MyChart message. Pt can be seen Monday Oct 21st.

## 2023-05-19 ENCOUNTER — Telehealth: Payer: Self-pay

## 2023-05-19 NOTE — Telephone Encounter (Signed)
Routed to Jeb Levering, Charity fundraiser for clarification

## 2023-05-19 NOTE — Telephone Encounter (Signed)
  Patient Consent for Virtual Visit        Tracy Dyer has provided verbal consent on 05/19/2023 for a virtual visit (video or telephone).   CONSENT FOR VIRTUAL VISIT FOR:  Tracy Dyer  By participating in this virtual visit I agree to the following:  I hereby voluntarily request, consent and authorize [facility] and its employed or contracted physicians, physician assistants, nurse practitioners or other licensed health care professionals (the Practitioner), to provide me with telemedicine health care services (the "Services") as deemed necessary by the treating Practitioner. I acknowledge and consent to receive the Services by the Practitioner via telemedicine. I understand that the telemedicine visit will involve communicating with the Practitioner through live audiovisual communication technology and the disclosure of certain medical information by electronic transmission. I acknowledge that I have been given the opportunity to request an in-person assessment or other available alternative prior to the telemedicine visit and am voluntarily participating in the telemedicine visit.  I understand that I have the right to withhold or withdraw my consent to the use of telemedicine in the course of my care at any time, without affecting my right to future care or treatment, and that the Practitioner or I may terminate the telemedicine visit at any time. I understand that I have the right to inspect all information obtained and/or recorded in the course of the telemedicine visit and may receive copies of available information for a reasonable fee.  I understand that some of the potential risks of receiving the Services via telemedicine include:  Delay or interruption in medical evaluation due to technological equipment failure or disruption; Information transmitted may not be sufficient (e.g. poor resolution of images) to allow for appropriate medical decision making by the Practitioner;  and/or  In rare instances, security protocols could fail, causing a breach of personal health information.  Furthermore, I acknowledge that it is my responsibility to provide information about my medical history, conditions and care that is complete and accurate to the best of my ability. I acknowledge that Practitioner's advice, recommendations, and/or decision may be based on factors not within their control, such as incomplete or inaccurate data provided by me or distortions of diagnostic images or specimens that may result from electronic transmissions. I understand that the practice of medicine is not an exact science and that Practitioner makes no warranties or guarantees regarding treatment outcomes. I acknowledge that a copy of this consent can be made available to me via my patient portal [facility] MyChart), or I can request a printed copy by calling the office of [facility].    I understand that my insurance will be billed for this visit.   I have read or had this consent read to me. I understand the contents of this consent, which adequately explains the benefits and risks of the Services being provided via telemedicine.  I have been provided ample opportunity to ask questions regarding this consent and the Services and have had my questions answered to my satisfaction. I give my informed consent for the services to be provided through the use of telemedicine in my medical care

## 2023-05-19 NOTE — Telephone Encounter (Signed)
Called pt. Appt made, consent obtained.

## 2023-05-20 ENCOUNTER — Ambulatory Visit: Payer: BC Managed Care – PPO | Attending: Cardiology | Admitting: Cardiology

## 2023-05-20 ENCOUNTER — Encounter: Payer: Self-pay | Admitting: Cardiology

## 2023-05-20 VITALS — Ht 66.0 in | Wt 146.0 lb

## 2023-05-20 DIAGNOSIS — I341 Nonrheumatic mitral (valve) prolapse: Secondary | ICD-10-CM | POA: Diagnosis not present

## 2023-05-20 DIAGNOSIS — G459 Transient cerebral ischemic attack, unspecified: Secondary | ICD-10-CM

## 2023-05-20 DIAGNOSIS — I340 Nonrheumatic mitral (valve) insufficiency: Secondary | ICD-10-CM | POA: Diagnosis not present

## 2023-05-20 NOTE — H&P (View-Only) (Signed)
Virtual Visit via Video Note   Because of Tracy Dyer's co-morbid illnesses, she is at least at moderate risk for complications without adequate follow up.  This format is felt to be most appropriate for this patient at this time.  All issues noted in this document were discussed and addressed.  A limited physical exam was performed with this format.  Please refer to the patient's chart for her consent to telehealth for [facility].      Date:  05/21/2023   ID:  Tracy Dyer, DOB 1971-01-27, MRN [id number]  Patient Location: Home Provider Location: Office/Clinic  PCP:  Jettie Pagan, NP  Cardiologist:  Maisie Fus, MD  Electrophysiologist:  None   Evaluation Performed:  Follow-Up Visit  Chief Complaint:  " I am still feeling back.   History of Present Illness:    Tracy Dyer is a 52 y.o. female with moderate to severe mitral regurgitation,   She is Dr. Carolan Clines patient. I am seeing her today to discuss her TEE result and next steps.   The patient does not have symptoms concerning for COVID-19 infection (fever, chills, cough, or new shortness of breath).   She reports experiencing dizziness and tiredness, which have been ongoing. The patient expresses anxiety about her condition and eagerness to get it fixed. She also reports experiencing headaches, but it is unclear if this is related to her heart condition or another issue such as sinuses.  Past Medical History:  Diagnosis Date   Anxiety    Cancer (HCC)    breast   Depression    Family history of breast cancer    Family history of lung cancer    Family history of ovarian cancer    GERD (gastroesophageal reflux disease)    diet controlled   History of radiation therapy    Left breast, left axilla- 05/27/21-07/10/21- Dr. Antony Blackbird   Seasonal allergies    Past Surgical History:  Procedure Laterality Date   BREAST BIOPSY Left 2019   x 2 biopsy   BREAST EXCISIONAL BIOPSY Left    BREAST  LUMPECTOMY WITH RADIOACTIVE SEED AND SENTINEL LYMPH NODE BIOPSY Left 02/26/2021   Procedure: LEFT BREAST LUMPECTOMY WITH RADIOACTIVE SEED x2; LEFT AXILLARY SENTINEL NODE BIOPSY;  Surgeon: Emelia Loron, MD;  Location: [facility];  Service: General;  Laterality: Left;   BREAST LUMPECTOMY WITH RADIOFREQUENCY TAG IDENTIFICATION Left 04/18/2021   Procedure: LEFT BREAST MRI WIRE GUIDED EXCISION;  Surgeon: Emelia Loron, MD;  Location: [facility];  Service: General;  Laterality: Left;   BREAST REDUCTION SURGERY Bilateral 04/18/2021   Procedure: MAMMARY REDUCTION  (BREAST) LEFT ONCOPLASTIC BREAST RECONSTRUCTION, RIGHT BREAST REDUCTION;  Surgeon: Glenna Fellows, MD;  Location: [facility];  Service: Plastics;  Laterality: Bilateral;   CHOLECYSTECTOMY  2008   PORT-A-CATH REMOVAL Right 04/18/2021   Procedure: REMOVAL PORT-A-CATH;  Surgeon: Emelia Loron, MD;  Location: [facility];  Service: General;  Laterality: Right;   PORTACATH PLACEMENT N/A 09/05/2020   Procedure: INSERTION PORT-A-CATH;  Surgeon: Emelia Loron, MD;  Location: [facility];  Service: General;  Laterality: N/A;  PEC BLOCK   TEE WITHOUT CARDIOVERSION N/A 05/14/2023   Procedure: TRANSESOPHAGEAL ECHOCARDIOGRAM;  Surgeon: Maisie Fus, MD;  Location: [facility];  Service: Cardiovascular;  Laterality: N/A;   WISDOM TOOTH EXTRACTION       Current Meds  Medication Sig   B Complex-C-Iron TABS Take 1 tablet by mouth daily.   clonazePAM (  KLONOPIN) 1 MG tablet Take 1 tablet (1 mg total) by mouth 2 (two) times daily as needed. (Patient taking differently: Take 1 mg by mouth 2 (two) times daily.)   DHA-EPA-Coenzyme Q10-Vitamin E (COQ-10 & FISH OIL PO) Take 3 capsules by mouth daily.   fluticasone (FLONASE) 50 MCG/ACT nasal spray Place 1 spray into both nostrils daily as needed for allergies.   MAGNESIUM PO Take 1 tablet by mouth daily.   Nutritional Supplements  (NUTRITIONAL SUPPLEMENT PO) Take 1 Scoop by mouth daily.   OVER THE COUNTER MEDICATION Take 4 capsules by mouth daily. "Nutrafol": Vitamin A, Vitamin C, Vitamin D, Vitamin E, Biotin, Calcium, Iodine, Zinc, Selenium, Potassium, Maca Powder, Saw Palmetto extract, ashwaganda, hydrolized marine collagen, turmeric extract, palm extract, haematococcspluvlalis (algae), l-lysine, l-methionine, l-cystine, horse tail extract, japanese knotweed, black pepper extract, cayenne extract   sertraline (ZOLOFT) 100 MG tablet Take one tablet (100 mg dose) by mouth at bedtime.   tirzepatide (ZEPBOUND) 15 MG/0.5ML Pen Inject 15 mg into the skin once a week.   Vitamin D-Vitamin K (VITAMIN D2 + K1 PO) Take 2 drops by mouth daily. Liquid     Allergies:   Sulfa antibiotics, Propoxyphene, and Sulfamethoxazole   Social History   Tobacco Use   Smoking status: Never   Smokeless tobacco: Never  Vaping Use   Vaping status: Never Used  Substance Use Topics   Alcohol use: Not Currently   Drug use: Never     Family Hx: The patient's family history includes Allergic rhinitis in her brother; Breast cancer in her mother; Cancer in her maternal aunt, maternal uncle, and paternal great-grandmother; Eczema in her father; Heart Problems in her paternal grandfather; Lung cancer (age of onset: 66) in her maternal grandfather; Melanoma in her cousin; Ovarian cancer in her paternal great-grandmother.  ROS:   Please see the history of present illness.    Fatigue and shortness of breath All other systems reviewed and are negative.   Prior CV studies:   The following studies were reviewed today:  TEE 05/14/2023 IMPRESSIONS     1. Left ventricular ejection fraction, by estimation, is 60 to 65%. The  left ventricle has normal function.   2. Right ventricular systolic function is normal. The right ventricular  size is normal.   3. No left atrial/left atrial appendage thrombus was detected.   4. There are multiple jets, EROA  may be underestimating the severity of  regurgitation. 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   5. The aortic valve is normal in structure. Aortic valve regurgitation is  not visualized.   Conclusion(s)/Recommendation(s): Referral sent to Dr. Leafy Ro.   FINDINGS   Left Ventricle: Left ventricular ejection fraction, by estimation, is 60  to 65%. The left ventricle has normal function. The left ventricular  internal cavity size was normal in size.   Right Ventricle: The right ventricular size is normal. Right ventricular  systolic function is normal.   Left Atrium: No left atrial/left atrial appendage thrombus was detected.   Mitral Valve: There are multiple jets, EROA may be underestimating the  severity of regurgitation. 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   Tricuspid Valve: The tricuspid valve is normal in structure. Tricuspid  valve regurgitation is trivial.   Aortic Valve: The aortic valve is normal in structure. Aortic valve  regurgitation is not visualized.  Pulmonic Valve: The pulmonic valve was normal in structure. Pulmonic valve  regurgitation is not visualized.   Aorta: The aortic root and ascending aorta are structurally normal, with  no evidence of dilitation. There is minimal (Grade I) plaque.   IAS/Shunts: No atrial level shunt detected by color flow Doppler.   Labs/Other Tests and Data Reviewed:    EKG:  No ECG reviewed.  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 The patient has had a recent echocardiogram showing worsening valve function. The patient is aware of the need for potential surgical intervention and is eager to proceed. -Schedule cardiac catheterization as soon as possible to assess coronary arteries prior to valve surgery. The patient understands that risks include but are not limited to stroke (1 in 1000), death (1 in 1000), kidney failure [usually temporary] (1 in 500), bleeding (1 in 200), allergic reaction [possibly serious] (1 in 200), and agrees to proceed.    -Advise patient to limit vigorous exercise and weightlifting until further workup is completed. Walking and basic cardio are acceptable.   General Health Maintenance -Advise patient to reduce salt intake.   COVID-19 Education: The signs and symptoms of COVID-19 were discussed with the patient and how to seek care for testing (follow up with PCP or arrange E-visit).  The importance of social distancing was discussed today.  Time:   Today, I have spent 12 minutes with the patient with telehealth technology discussing the above problems.     Medication Adjustments/Labs and Tests Ordered: Current medicines are reviewed at length with the patient today.  Concerns regarding medicines are outlined above.   Tests Ordered: Orders Placed This Encounter  Procedures   LEFT AND RIGHT HEART CATHETERIZATION WITH CORONARY ANGIOGRAM    Medication Changes: No orders of the defined types were placed in this encounter.   Follow Up:  In Person in 12 week(s)  Signed, Thomasene Ripple, DO  05/21/2023 11:35 PM    [facility]

## 2023-05-20 NOTE — Patient Instructions (Addendum)
Medication Instructions:  NO CHANGES  *If you need a refill on your cardiac medications before your next appointment, please call your pharmacy*  Testing/Procedures: Right and Left Cardiac Catheterization at [facility]   Follow-Up: At [facility], you and your health needs are our priority.  As part of our continuing mission to provide you with exceptional heart care, we have created designated Provider Care Teams.  These Care Teams include your primary Cardiologist (physician) and Advanced Practice Providers (APPs -  Physician Assistants and Nurse Practitioners) who all work together to provide you with the care you need, when you need it.  We recommend signing up for the patient portal called "MyChart".  Sign up information is provided on this After Visit Summary.  MyChart is used to connect with patients for Virtual Visits (Telemedicine).  Patients are able to view lab/test results, encounter notes, upcoming appointments, etc.  Non-urgent messages can be sent to your provider as well.   To learn more about what you can do with MyChart, go to [email or web address].    Your next appointment:    12 weeks with Dr. Wyline Mood  Other Instructions  [facility] A DEPT OF [place]. [facility] AT [facility] [address] [place] [address] Dept: [phone] Loc: [phone]  Tracy Dyer  05/20/2023  You are scheduled for a Right & Left Cardiac Catheterization on Friday October 25 with Dr. Alverda Skeans.  1. Please arrive at the [facility] (Main Entrance A) at [facility]: [address] [place], [place] [address] at 5:30am (This time is 2 hour(s) before your procedure to ensure your preparation). Free valet parking service is available. You will check in at ADMITTING. The support person will be asked to wait in the waiting room.  It is OK to have someone drop you off and come back when you are ready to be  discharged.    Special note: Every effort is made to have your procedure done on time. Please understand that emergencies sometimes delay scheduled procedures.  2. Diet: Do not eat solid foods after midnight.  The patient may have clear liquids until 5am upon the day of the procedure.  3. Labs: None Needed - done 05/07/23  4. Medication instructions in preparation for your procedure:   On the morning of your procedure, take Aspirin 81 mg and any morning medicines NOT listed above.  You may use sips of water.  5. Plan to go home the same day, you will only stay overnight if medically necessary. 6. Bring a current list of your medications and current insurance cards. 7. You MUST have a responsible person to drive you home. 8. Someone MUST be with you the first 24 hours after you arrive home or your discharge will be delayed. 9. Please wear clothes that are easy to get on and off and wear slip-on shoes.  Thank you for allowing [place] to care for you!   -- [facility]

## 2023-05-20 NOTE — Progress Notes (Signed)
Virtual Visit via Video Note   Because of [name]'s co-morbid illnesses, she is at least at moderate risk for complications without adequate follow up.  This format is felt to be most appropriate for this patient at this time.  All issues noted in this document were discussed and addressed.  A limited physical exam was performed with this format.  Please refer to the patient's chart for her consent to telehealth for [facility].      Date:  05/21/2023   ID:  Geroldine Goodno, DOB 1971-01-27, MRN [id number]  Patient Location: Home Provider Location: Office/Clinic  PCP:  Jettie Pagan, NP  Cardiologist:  Maisie Fus, MD  Electrophysiologist:  None   Evaluation Performed:  Follow-Up Visit  Chief Complaint:  " I am still feeling back.   History of Present Illness:    [name] is a 52 y.o. female with moderate to severe mitral regurgitation,   She is Dr. Carolan Clines patient. I am seeing her today to discuss her TEE result and next steps.   The patient does not have symptoms concerning for COVID-19 infection (fever, chills, cough, or new shortness of breath).   She reports experiencing dizziness and tiredness, which have been ongoing. The patient expresses anxiety about her condition and eagerness to get it fixed. She also reports experiencing headaches, but it is unclear if this is related to her heart condition or another issue such as sinuses.  Past Medical History:  Diagnosis Date   Anxiety    Cancer (HCC)    breast   Depression    Family history of breast cancer    Family history of lung cancer    Family history of ovarian cancer    GERD (gastroesophageal reflux disease)    diet controlled   History of radiation therapy    Left breast, left axilla- 05/27/21-07/10/21- Dr. Antony Blackbird   Seasonal allergies    Past Surgical History:  Procedure Laterality Date   BREAST BIOPSY Left 2019   x 2 biopsy   BREAST EXCISIONAL BIOPSY Left    BREAST  LUMPECTOMY WITH RADIOACTIVE SEED AND SENTINEL LYMPH NODE BIOPSY Left 02/26/2021   Procedure: LEFT BREAST LUMPECTOMY WITH RADIOACTIVE SEED x2; LEFT AXILLARY SENTINEL NODE BIOPSY;  Surgeon: Emelia Loron, MD;  Location: [facility];  Service: General;  Laterality: Left;   BREAST LUMPECTOMY WITH RADIOFREQUENCY TAG IDENTIFICATION Left 04/18/2021   Procedure: LEFT BREAST MRI WIRE GUIDED EXCISION;  Surgeon: Emelia Loron, MD;  Location: [facility];  Service: General;  Laterality: Left;   BREAST REDUCTION SURGERY Bilateral 04/18/2021   Procedure: MAMMARY REDUCTION  (BREAST) LEFT ONCOPLASTIC BREAST RECONSTRUCTION, RIGHT BREAST REDUCTION;  Surgeon: Glenna Fellows, MD;  Location: [facility];  Service: Plastics;  Laterality: Bilateral;   CHOLECYSTECTOMY  2008   PORT-A-CATH REMOVAL Right 04/18/2021   Procedure: REMOVAL PORT-A-CATH;  Surgeon: Emelia Loron, MD;  Location: [facility];  Service: General;  Laterality: Right;   PORTACATH PLACEMENT N/A 09/05/2020   Procedure: INSERTION PORT-A-CATH;  Surgeon: Emelia Loron, MD;  Location: [facility];  Service: General;  Laterality: N/A;  PEC BLOCK   TEE WITHOUT CARDIOVERSION N/A 05/14/2023   Procedure: TRANSESOPHAGEAL ECHOCARDIOGRAM;  Surgeon: Maisie Fus, MD;  Location: [facility];  Service: Cardiovascular;  Laterality: N/A;   WISDOM TOOTH EXTRACTION       Current Meds  Medication Sig   B Complex-C-Iron TABS Take 1 tablet by mouth daily.   clonazePAM (  KLONOPIN) 1 MG tablet Take 1 tablet (1 mg total) by mouth 2 (two) times daily as needed. (Patient taking differently: Take 1 mg by mouth 2 (two) times daily.)   DHA-EPA-Coenzyme Q10-Vitamin E (COQ-10 & FISH OIL PO) Take 3 capsules by mouth daily.   fluticasone (FLONASE) 50 MCG/ACT nasal spray Place 1 spray into both nostrils daily as needed for allergies.   MAGNESIUM PO Take 1 tablet by mouth daily.   Nutritional Supplements  (NUTRITIONAL SUPPLEMENT PO) Take 1 Scoop by mouth daily.   OVER THE COUNTER MEDICATION Take 4 capsules by mouth daily. "Nutrafol": Vitamin A, Vitamin C, Vitamin D, Vitamin E, Biotin, Calcium, Iodine, Zinc, Selenium, Potassium, Maca Powder, Saw Palmetto extract, ashwaganda, hydrolized marine collagen, turmeric extract, palm extract, haematococcspluvlalis (algae), l-lysine, l-methionine, l-cystine, horse tail extract, japanese knotweed, black pepper extract, cayenne extract   sertraline (ZOLOFT) 100 MG tablet Take one tablet (100 mg dose) by mouth at bedtime.   tirzepatide (ZEPBOUND) 15 MG/0.5ML Pen Inject 15 mg into the skin once a week.   Vitamin D-Vitamin K (VITAMIN D2 + K1 PO) Take 2 drops by mouth daily. Liquid     Allergies:   Sulfa antibiotics, Propoxyphene, and Sulfamethoxazole   Social History   Tobacco Use   Smoking status: Never   Smokeless tobacco: Never  Vaping Use   Vaping status: Never Used  Substance Use Topics   Alcohol use: Not Currently   Drug use: Never     Family Hx: The patient's family history includes Allergic rhinitis in her brother; Breast cancer in her mother; Cancer in her maternal aunt, maternal uncle, and paternal great-grandmother; Eczema in her father; Heart Problems in her paternal grandfather; Lung cancer (age of onset: 66) in her maternal grandfather; Melanoma in her cousin; Ovarian cancer in her paternal great-grandmother.  ROS:   Please see the history of present illness.    Fatigue and shortness of breath All other systems reviewed and are negative.   Prior CV studies:   The following studies were reviewed today:  TEE 05/14/2023 IMPRESSIONS     1. Left ventricular ejection fraction, by estimation, is 60 to 65%. The  left ventricle has normal function.   2. Right ventricular systolic function is normal. The right ventricular  size is normal.   3. No left atrial/left atrial appendage thrombus was detected.   4. There are multiple jets, EROA  may be underestimating the severity of  regurgitation. 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   5. The aortic valve is normal in structure. Aortic valve regurgitation is  not visualized.   Conclusion(s)/Recommendation(s): Referral sent to Dr. Leafy Ro.   FINDINGS   Left Ventricle: Left ventricular ejection fraction, by estimation, is 60  to 65%. The left ventricle has normal function. The left ventricular  internal cavity size was normal in size.   Right Ventricle: The right ventricular size is normal. Right ventricular  systolic function is normal.   Left Atrium: No left atrial/left atrial appendage thrombus was detected.   Mitral Valve: There are multiple jets, EROA may be underestimating the  severity of regurgitation. 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   Tricuspid Valve: The tricuspid valve is normal in structure. Tricuspid  valve regurgitation is trivial.   Aortic Valve: The aortic valve is normal in structure. Aortic valve  regurgitation is not visualized.  Pulmonic Valve: The pulmonic valve was normal in structure. Pulmonic valve  regurgitation is not visualized.   Aorta: The aortic root and ascending aorta are structurally normal, with  no evidence of dilitation. There is minimal (Grade I) plaque.   IAS/Shunts: No atrial level shunt detected by color flow Doppler.   Labs/Other Tests and Data Reviewed:    EKG:  No ECG reviewed.  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 The patient has had a recent echocardiogram showing worsening valve function. The patient is aware of the need for potential surgical intervention and is eager to proceed. -Schedule cardiac catheterization as soon as possible to assess coronary arteries prior to valve surgery. The patient understands that risks include but are not limited to stroke (1 in 1000), death (1 in 1000), kidney failure [usually temporary] (1 in 500), bleeding (1 in 200), allergic reaction [possibly serious] (1 in 200), and agrees to proceed.    -Advise patient to limit vigorous exercise and weightlifting until further workup is completed. Walking and basic cardio are acceptable.   General Health Maintenance -Advise patient to reduce salt intake.   COVID-19 Education: The signs and symptoms of COVID-19 were discussed with the patient and how to seek care for testing (follow up with PCP or arrange E-visit).  The importance of social distancing was discussed today.  Time:   Today, I have spent 12 minutes with the patient with telehealth technology discussing the above problems.     Medication Adjustments/Labs and Tests Ordered: Current medicines are reviewed at length with the patient today.  Concerns regarding medicines are outlined above.   Tests Ordered: Orders Placed This Encounter  Procedures   LEFT AND RIGHT HEART CATHETERIZATION WITH CORONARY ANGIOGRAM    Medication Changes: No orders of the defined types were placed in this encounter.   Follow Up:  In Person in 12 week(s)  Signed, Thomasene Ripple, DO  05/21/2023 11:35 PM    [facility]

## 2023-05-21 ENCOUNTER — Other Ambulatory Visit (HOSPITAL_BASED_OUTPATIENT_CLINIC_OR_DEPARTMENT_OTHER): Payer: Self-pay

## 2023-05-21 MED ORDER — ZEPBOUND 15 MG/0.5ML ~~LOC~~ SOAJ
15.0000 mg | SUBCUTANEOUS | 0 refills | Status: DC
  Filled 2023-05-21: qty 2, 28d supply, fill #0
  Filled 2023-07-27: qty 2, 28d supply, fill #1
  Filled 2023-08-27: qty 2, 28d supply, fill #2

## 2023-05-22 ENCOUNTER — Other Ambulatory Visit (HOSPITAL_BASED_OUTPATIENT_CLINIC_OR_DEPARTMENT_OTHER): Payer: Self-pay

## 2023-05-24 ENCOUNTER — Emergency Department (HOSPITAL_COMMUNITY): Payer: BC Managed Care – PPO

## 2023-05-24 ENCOUNTER — Other Ambulatory Visit: Payer: Self-pay

## 2023-05-24 ENCOUNTER — Emergency Department (HOSPITAL_COMMUNITY)
Admission: EM | Admit: 2023-05-24 | Discharge: 2023-05-24 | Disposition: A | Discharge status: Home / Self Care | Payer: BC Managed Care – PPO | Attending: Emergency Medicine | Admitting: Emergency Medicine

## 2023-05-24 DIAGNOSIS — Z853 Personal history of malignant neoplasm of breast: Secondary | ICD-10-CM | POA: Diagnosis not present

## 2023-05-24 DIAGNOSIS — Y909 Presence of alcohol in blood, level not specified: Secondary | ICD-10-CM | POA: Insufficient documentation

## 2023-05-24 DIAGNOSIS — R200 Anesthesia of skin: Secondary | ICD-10-CM | POA: Insufficient documentation

## 2023-05-24 DIAGNOSIS — Z8616 Personal history of COVID-19: Secondary | ICD-10-CM | POA: Insufficient documentation

## 2023-05-24 DIAGNOSIS — Z8673 Personal history of transient ischemic attack (TIA), and cerebral infarction without residual deficits: Secondary | ICD-10-CM | POA: Insufficient documentation

## 2023-05-24 DIAGNOSIS — F419 Anxiety disorder, unspecified: Secondary | ICD-10-CM | POA: Insufficient documentation

## 2023-05-24 DIAGNOSIS — R4182 Altered mental status, unspecified: Secondary | ICD-10-CM | POA: Diagnosis not present

## 2023-05-24 DIAGNOSIS — R55 Syncope and collapse: Secondary | ICD-10-CM | POA: Diagnosis not present

## 2023-05-24 DIAGNOSIS — R110 Nausea: Secondary | ICD-10-CM | POA: Insufficient documentation

## 2023-05-24 DIAGNOSIS — R42 Dizziness and giddiness: Secondary | ICD-10-CM | POA: Insufficient documentation

## 2023-05-24 DIAGNOSIS — R519 Headache, unspecified: Secondary | ICD-10-CM | POA: Diagnosis not present

## 2023-05-24 DIAGNOSIS — R011 Cardiac murmur, unspecified: Secondary | ICD-10-CM | POA: Diagnosis not present

## 2023-05-24 DIAGNOSIS — G459 Transient cerebral ischemic attack, unspecified: Secondary | ICD-10-CM | POA: Diagnosis not present

## 2023-05-24 LAB — CBC WITH DIFFERENTIAL/PLATELET
Abs Immature Granulocytes: 0.01 10*3/uL (ref 0.00–0.07)
Basophils Absolute: 0.1 10*3/uL (ref 0.0–0.1)
Basophils Relative: 1 %
Eosinophils Absolute: 0.1 10*3/uL (ref 0.0–0.5)
Eosinophils Relative: 2 %
HCT: 39.3 % (ref 36.0–46.0)
Hemoglobin: 13.6 g/dL (ref 12.0–15.0)
Immature Granulocytes: 0 %
Lymphocytes Relative: 33 %
Lymphs Abs: 1.4 10*3/uL (ref 0.7–4.0)
MCH: 32 pg (ref 26.0–34.0)
MCHC: 34.6 g/dL (ref 30.0–36.0)
MCV: 92.5 fL (ref 80.0–100.0)
Monocytes Absolute: 0.4 10*3/uL (ref 0.1–1.0)
Monocytes Relative: 9 %
Neutro Abs: 2.3 10*3/uL (ref 1.7–7.7)
Neutrophils Relative %: 55 %
Platelets: 175 10*3/uL (ref 150–400)
RBC: 4.25 MIL/uL (ref 3.87–5.11)
RDW: 13.3 % (ref 11.5–15.5)
WBC: 4.2 10*3/uL (ref 4.0–10.5)
nRBC: 0 % (ref 0.0–0.2)

## 2023-05-24 LAB — COMPREHENSIVE METABOLIC PANEL
ALT: 47 U/L — ABNORMAL HIGH (ref 0–44)
AST: 38 U/L (ref 15–41)
Albumin: 3.9 g/dL (ref 3.5–5.0)
Alkaline Phosphatase: 80 U/L (ref 38–126)
Anion gap: 6 (ref 5–15)
BUN: 11 mg/dL (ref 6–20)
CO2: 23 mmol/L (ref 22–32)
Calcium: 9 mg/dL (ref 8.9–10.3)
Chloride: 110 mmol/L (ref 98–111)
Creatinine, Ser: 0.73 mg/dL (ref 0.44–1.00)
GFR, Estimated: >60 mL/min (ref >60)
Glucose, Bld: 88 mg/dL (ref 70–99)
Potassium: 4 mmol/L (ref 3.5–5.1)
Sodium: 139 mmol/L (ref 135–145)
Total Bilirubin: 0.7 mg/dL (ref 0.3–1.2)
Total Protein: 6.4 g/dL — ABNORMAL LOW (ref 6.5–8.1)

## 2023-05-24 LAB — CBC
HCT: 42.2 % (ref 36.0–46.0)
Hemoglobin: 14.6 g/dL (ref 12.0–15.0)
MCH: 31.4 pg (ref 26.0–34.0)
MCHC: 34.6 g/dL (ref 30.0–36.0)
MCV: 90.8 fL (ref 80.0–100.0)
Platelets: 193 10*3/uL (ref 150–400)
RBC: 4.65 MIL/uL (ref 3.87–5.11)
RDW: 13.5 % (ref 11.5–15.5)
WBC: 3.7 10*3/uL — ABNORMAL LOW (ref 4.0–10.5)
nRBC: 0 % (ref 0.0–0.2)

## 2023-05-24 LAB — URINALYSIS, ROUTINE W REFLEX MICROSCOPIC
Bilirubin Urine: NEGATIVE
Glucose, UA: NEGATIVE mg/dL
Hgb urine dipstick: NEGATIVE
Ketones, ur: NEGATIVE mg/dL
Leukocytes,Ua: NEGATIVE
Nitrite: NEGATIVE
Protein, ur: NEGATIVE mg/dL
Specific Gravity, Urine: 1.002 — ABNORMAL LOW (ref 1.005–1.030)
pH: 7 (ref 5.0–8.0)

## 2023-05-24 LAB — DIFFERENTIAL
Abs Immature Granulocytes: 0.01 10*3/uL (ref 0.00–0.07)
Basophils Absolute: 0 10*3/uL (ref 0.0–0.1)
Basophils Relative: 1 %
Eosinophils Absolute: 0.1 10*3/uL (ref 0.0–0.5)
Eosinophils Relative: 2 %
Immature Granulocytes: 0 %
Lymphocytes Relative: 33 %
Lymphs Abs: 1.4 10*3/uL (ref 0.7–4.0)
Monocytes Absolute: 0.4 10*3/uL (ref 0.1–1.0)
Monocytes Relative: 10 %
Neutro Abs: 2.3 10*3/uL (ref 1.7–7.7)
Neutrophils Relative %: 54 %

## 2023-05-24 LAB — TROPONIN I (HIGH SENSITIVITY)
Troponin I (High Sensitivity): 8 ng/L (ref <18)
Troponin I (High Sensitivity): 7 ng/L (ref <18)

## 2023-05-24 LAB — I-STAT CHEM 8, ED
BUN: 12 mg/dL (ref 6–20)
Calcium, Ion: 1.16 mmol/L (ref 1.15–1.40)
Chloride: 105 mmol/L (ref 98–111)
Creatinine, Ser: 0.7 mg/dL (ref 0.44–1.00)
Glucose, Bld: 84 mg/dL (ref 70–99)
HCT: 38 % (ref 36.0–46.0)
Hemoglobin: 12.9 g/dL (ref 12.0–15.0)
Potassium: 4 mmol/L (ref 3.5–5.1)
Sodium: 139 mmol/L (ref 135–145)
TCO2: 23 mmol/L (ref 22–32)

## 2023-05-24 LAB — HCG, SERUM, QUALITATIVE: Preg, Serum: NEGATIVE

## 2023-05-24 LAB — RAPID URINE DRUG SCREEN, HOSP PERFORMED
Amphetamines: NOT DETECTED
Barbiturates: NOT DETECTED
Benzodiazepines: NOT DETECTED
Cocaine: NOT DETECTED
Opiates: NOT DETECTED
Tetrahydrocannabinol: NOT DETECTED

## 2023-05-24 LAB — PROTIME-INR
INR: 0.9 (ref 0.8–1.2)
Prothrombin Time: 12.7 s (ref 11.4–15.2)

## 2023-05-24 LAB — APTT: aPTT: 29 s (ref 24–36)

## 2023-05-24 LAB — CBG MONITORING, ED: Glucose-Capillary: 80 mg/dL (ref 70–99)

## 2023-05-24 MED ORDER — LACTATED RINGERS IV BOLUS
1000.0000 mL | Freq: Once | INTRAVENOUS | Status: AC
Start: 2023-05-24 — End: 2023-05-24
  Administered 2023-05-24: 1000 mL via INTRAVENOUS

## 2023-05-24 MED ORDER — DIPHENHYDRAMINE HCL 50 MG/ML IJ SOLN
12.5000 mg | Freq: Once | INTRAMUSCULAR | Status: AC
  Administered 2023-05-24: 12.5 mg via INTRAVENOUS
  Filled 2023-05-24: qty 1

## 2023-05-24 MED ORDER — METOCLOPRAMIDE HCL 5 MG/ML IJ SOLN
10.0000 mg | Freq: Once | INTRAMUSCULAR | Status: AC
  Administered 2023-05-24: 10 mg via INTRAVENOUS
  Filled 2023-05-24: qty 2

## 2023-05-24 MED ORDER — KETOROLAC TROMETHAMINE 15 MG/ML IJ SOLN
15.0000 mg | Freq: Once | INTRAMUSCULAR | Status: AC
  Administered 2023-05-24: 15 mg via INTRAVENOUS
  Filled 2023-05-24: qty 1

## 2023-05-24 MED ORDER — DIAZEPAM 5 MG/ML IJ SOLN
5.0000 mg | Freq: Once | INTRAMUSCULAR | Status: AC
  Administered 2023-05-24: 5 mg via INTRAVENOUS
  Filled 2023-05-24: qty 2

## 2023-05-24 MED ORDER — GADOBUTROL 1 MMOL/ML IV SOLN
7.0000 mL | Freq: Once | INTRAVENOUS | Status: AC | PRN
  Administered 2023-05-24: 7 mL via INTRAVENOUS

## 2023-05-24 NOTE — ED Provider Notes (Signed)
Patient care signed out to follow-up MRI results.  Patient is followed closely by cardiology and primary doctor for significant mitral valve regurgitation.  Patient's had a few episodes of near syncope and is being evaluated for mitral valve repair in the near future.  Patient had an episode of lightheadedness, numbness, headache.  Patient's blood work was reviewed overall unremarkable no signs significant anemia.  Patient clinically says she feels better.  MRI results were followed up reviewed independently and no signs of mass, mets or stroke.  Patient comfortable and asking for discharge and to follow-up closely outpatient.  Paged cardiology to ensure that is a safe plan given her significant mitral valve regurgitation.  Spoke with Dr Rob Bunting cardiology and we reviewed her workup and recent evaluation by cardiology.  He approves her to be discharged and follow-up in the clinic.  Patient comfortable plan.     Blane Ohara, MD 05/24/23 2025

## 2023-05-24 NOTE — ED Triage Notes (Addendum)
Pt. Stated, I went to DR. Last week cause of my bowels, Im having episodes were I feel like Im going to pass out. My cardiologist says my valves are not connecting. I just had one while I was at work. I dont know were Iam, I can't remember, and feel like Im passing out. I also have a bad headache. I got COVID in [place]. So this has been going on for 4 weeks. Ive had cancer and chemo-therapy a year ago.  Pt is Alert and oriented x 3. Pt crying anxious at triage.

## 2023-05-24 NOTE — ED Provider Notes (Signed)
[place] EMERGENCY DEPARTMENT AT [facility] Provider Note   CSN: [id number] Arrival date & time: 05/24/23  1010     History  Chief Complaint  Patient presents with   Dizziness   Altered Mental Status   Headache   Nausea    Tracy Dyer is a 52 y.o. female.   Dizziness Associated symptoms: headaches   Altered Mental Status Presenting symptoms: confusion   Associated symptoms: headaches and light-headedness   Headache Associated symptoms: dizziness and numbness   Patient presents for near syncope and altered mental status.  Medical history includes breast cancer, TIA, anxiety, GERD, depression.  She has had a recent worsening of cardiac murmur.  She has been getting evaluated by cardiology who feel that she has progression of her MR.  She has plans for preoperative heart cath.  For her cancer, she has undergone lumpectomy and targeted lymph node dissection as well as chemotherapy and radiation.  She is currently undergoing 5-month surveillance.  She is on estrogen replacement therapy.  She has been having recent episodes of near syncope.  Onset of the symptoms was a month ago in the setting of travel and COVID-19 infection.  She also describes transient episodes of confusion.  She had a similar episode today, while at work.  For this reason, she presents the ED.  She describes a left lower facial numbness.  She states that she has had this before and attributes it to anxiety.  She denies any other areas of numbness or weakness.  She currently Dors is a dull headache and some nausea.     Home Medications Prior to Admission medications   Medication Sig Start Date End Date Taking? Authorizing Provider  B Complex-C-Iron TABS Take 1 tablet by mouth daily.   Yes [provider]  clonazePAM (KLONOPIN) 1 MG tablet Take 1 tablet (1 mg total) by mouth 2 (two) times daily as needed. Patient taking differently: Take 1 mg by mouth 2 (two) times daily. 03/12/23  Yes    DHA-EPA-Coenzyme Q10-Vitamin E (COQ-10 & FISH OIL PO) Take 3 capsules by mouth daily.   Yes [provider]  estradiol (ESTRACE VAGINAL) 0.1 MG/GM vaginal cream Place 1 Applicatorful vaginally 3 (three) times a week. 05/05/23  Yes Causey, Larna Daughters, NP  fluticasone (FLONASE) 50 MCG/ACT nasal spray Place 1 spray into both nostrils daily as needed for allergies.   Yes [provider]  MAGNESIUM PO Take 1 tablet by mouth daily.   Yes [provider]  naproxen sodium (ALEVE) 220 MG tablet Take 220 mg by mouth daily as needed.   Yes [provider]  Nutritional Supplements (NUTRITIONAL SUPPLEMENT PO) Take 1 Scoop by mouth daily.   Yes [provider]  OVER THE COUNTER MEDICATION Take 4 capsules by mouth daily. "Nutrafol": Vitamin A, Vitamin C, Vitamin D, Vitamin E, Biotin, Calcium, Iodine, Zinc, Selenium, Potassium, Maca Powder, Saw Palmetto extract, ashwaganda, hydrolized marine collagen, turmeric extract, palm extract, haematococcspluvlalis (algae), l-lysine, l-methionine, l-cystine, horse tail extract, japanese knotweed, black pepper extract, cayenne extract   Yes [provider]  sertraline (ZOLOFT) 100 MG tablet Take one tablet (100 mg dose) by mouth at bedtime. 03/12/23  Yes   UNABLE TO FIND Take 2 capsules by mouth daily. Omega 7 (newchapter brand)   Yes [provider]  Vitamin D-Vitamin K (VITAMIN D2 + K1 PO) Take 2 drops by mouth daily. Liquid   Yes [provider]  tirzepatide (ZEPBOUND) 15 MG/0.5ML Pen Inject 15 mg into  the skin once a week. 05/19/23         Allergies    Sulfa antibiotics, Propoxyphene, and Sulfamethoxazole    Review of Systems   Review of Systems  Neurological:  Positive for dizziness, light-headedness, numbness and headaches.  Psychiatric/Behavioral:  Positive for confusion.   All other systems reviewed and are negative.   Physical Exam Updated Vital Signs BP (!) 96/55   Pulse (!) 53    Temp 97.7 F (36.5 C) (Oral)   Resp 15   SpO2 100%  Physical Exam Vitals and nursing note reviewed.  Constitutional:      General: She is not in acute distress.    Appearance: She is well-developed. She is not ill-appearing, toxic-appearing or diaphoretic.  HENT:     Head: Normocephalic and atraumatic.     Mouth/Throat:     Mouth: Mucous membranes are moist.  Eyes:     General: No visual field deficit.    Conjunctiva/sclera: Conjunctivae normal.  Cardiovascular:     Rate and Rhythm: Normal rate and regular rhythm.     Heart sounds: Murmur heard.  Pulmonary:     Effort: Pulmonary effort is normal. No respiratory distress.     Breath sounds: Normal breath sounds. No wheezing or rales.  Abdominal:     General: There is no distension.     Palpations: Abdomen is soft.     Tenderness: There is no abdominal tenderness.  Musculoskeletal:        General: No swelling.     Cervical back: Normal range of motion and neck supple.  Skin:    General: Skin is warm and dry.  Neurological:     Mental Status: She is alert and oriented to person, place, and time.     Cranial Nerves: No cranial nerve deficit, dysarthria or facial asymmetry.     Sensory: Sensory deficit present.     Motor: No weakness.     Coordination: Coordination normal.  Psychiatric:        Mood and Affect: Mood is anxious. Affect is tearful.        Speech: Speech normal.        Behavior: Behavior normal. Behavior is cooperative.        Thought Content: Thought content normal.     ED Results / Procedures / Treatments   Labs (all labs ordered are listed, but only abnormal results are displayed) Labs Reviewed  COMPREHENSIVE METABOLIC PANEL - Abnormal; Notable for the following components:      Result Value   Total Protein 6.4 (*)    ALT 47 (*)    All other components within normal limits  CBC - Abnormal; Notable for the following components:   WBC 3.7 (*)    All other components within normal limits  URINALYSIS,  ROUTINE W REFLEX MICROSCOPIC - Abnormal; Notable for the following components:   Color, Urine COLORLESS (*)    Specific Gravity, Urine 1.002 (*)    All other components within normal limits  PROTIME-INR  APTT  DIFFERENTIAL  HCG, SERUM, QUALITATIVE  RAPID URINE DRUG SCREEN, HOSP PERFORMED  CBC WITH DIFFERENTIAL/PLATELET  ETHANOL  CBG MONITORING, ED  I-STAT CHEM 8, ED  TROPONIN I (HIGH SENSITIVITY)  TROPONIN I (HIGH SENSITIVITY)    EKG None  Radiology No results found.  Procedures Procedures    Medications Ordered in ED Medications  metoCLOPramide (REGLAN) injection 10 mg (10 mg Intravenous Given 05/24/23 1250)  diphenhydrAMINE (BENADRYL) injection 12.5 mg (12.5 mg Intravenous Given 05/24/23  1256)  ketorolac (TORADOL) 15 MG/ML injection 15 mg (15 mg Intravenous Given 05/24/23 1253)  lactated ringers bolus 1,000 mL (0 mLs Intravenous Stopped 05/24/23 1419)  diazepam (VALIUM) injection 5 mg (5 mg Intravenous Given 05/24/23 1335)  gadobutrol (GADAVIST) 1 MMOL/ML injection 7 mL (7 mLs Intravenous Contrast Given 05/24/23 1519)    ED Course/ Medical Decision Making/ A&P                                 Medical Decision Making Amount and/or Complexity of Data Reviewed Labs: ordered. Radiology: ordered.  Risk Prescription drug management.   This patient presents to the ED for concern of near syncope, this involves an extensive number of treatment options, and is a complaint that carries with it a high risk of complications and morbidity.  The differential diagnosis includes anxiety, vasovagal episode, arrhythmia, CVA, TIA, seizure, polypharmacy, neoplasm   Co morbidities that complicate the patient evaluation  breast cancer, TIA, anxiety, GERD, depression   Additional history obtained:  Additional history obtained from patient's husband External records from outside source obtained and reviewed including EMR   Lab Tests:  I Ordered, and personally interpreted  labs.  The pertinent results include: Normal hemoglobin, no leukocytosis, normal kidney function, normal electrolytes, normal troponin   Imaging Studies ordered:  I ordered imaging studies including MRI brain I independently visualized and interpreted imaging which showed for (pending at time of signout) I agree with the radiologist interpretation   Cardiac Monitoring: / EKG:  The patient was maintained on a cardiac monitor.  I personally viewed and interpreted the cardiac monitored which showed an underlying rhythm of: Sinus rhythm   Problem List / ED Course / Critical interventions / Medication management  Patient presenting for near syncope and confusion.  She describes similar episodes occurring intermittently over the past month.  On arrival in the ED, vital signs are notable for bradycardia and soft blood pressures.  She states that this is baseline for her.  Per chart review, current vital signs are consistent with her recent outpatient visits.  Current breathing is unlabored.  Lungs are clear to auscultation.  She describes slightly diminished sensation in left lower face but has no other focal neurologic deficits on exam.  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  She is currently on estrogen replacement therapy following treatment for breast cancer.  She is not on a blood thinner.  Workup was initiated.  MRI was ordered to assess for possible CVA/TIA.  Headache cocktail was ordered for her headache and nausea.  Lab workup was unremarkable.  MRI was pending at time of signout.  Care of patient was signed out to oncoming ED provider.. I ordered medication including IV fluids, Reglan, Toradol, Benadryl for headache; Valium for anxiolysis during MRI Reevaluation of the patient after these medicines showed that the patient improved I have reviewed the patients home medicines and have made adjustments as needed   Social Determinants of Health:  Has  access to outpatient care        Final Clinical Impression(s) / ED Diagnoses Final diagnoses:  Near syncope    Rx / DC Orders ED Discharge Orders     None         Gloris Manchester, MD 05/24/23 1616

## 2023-05-24 NOTE — ED Notes (Signed)
Provider notified about new onset of pain in the arm. Patient described it as a squeezing of the upper left are that comes and goes. Pt states it radiates down the left arm. Pt describes it as "more discomfort than pain".

## 2023-05-24 NOTE — Discharge Instructions (Signed)
Cardiology approved your discharge and to follow-up in the clinic.

## 2023-05-24 NOTE — ED Notes (Signed)
Patient transported to MRI 

## 2023-05-27 ENCOUNTER — Telehealth: Payer: Self-pay | Admitting: *Deleted

## 2023-05-27 ENCOUNTER — Other Ambulatory Visit (HOSPITAL_BASED_OUTPATIENT_CLINIC_OR_DEPARTMENT_OTHER): Payer: Self-pay

## 2023-05-27 DIAGNOSIS — E041 Nontoxic single thyroid nodule: Secondary | ICD-10-CM | POA: Diagnosis not present

## 2023-05-27 MED ORDER — ZEPBOUND 15 MG/0.5ML ~~LOC~~ SOAJ
15.0000 mg | SUBCUTANEOUS | 0 refills | Status: DC
  Filled 2023-05-27: qty 2, 28d supply, fill #0

## 2023-05-27 NOTE — Telephone Encounter (Signed)
Cardiac Catheterization scheduled at [facility] for: Friday May 28, 2023 7:30 AM Arrival time [facility] Main Entrance A at: 5:30 AM  Nothing to eat after midnight prior to procedure, clear liquids until 5 AM day of procedure.  Medication instructions: -Usual morning medications can be taken with sips of water including aspirin 81 mg.  Patient reports she takes Zepbound weekly on Mondays-did not take Monday 05/24/23  Plan to go home the same day, you will only stay overnight if medically necessary.  You must have responsible adult to drive you home.  Someone must be with you the first 24 hours after you arrive home.  Reviewed procedure instructions with patient.

## 2023-05-27 NOTE — Plan of Care (Signed)
[facility] Tonsillectomy/Adenoidectomy, Postoperative PEDS care plan entered in error.

## 2023-05-28 ENCOUNTER — Other Ambulatory Visit: Payer: Self-pay

## 2023-05-28 ENCOUNTER — Ambulatory Visit (HOSPITAL_COMMUNITY)
Admission: RE | Admit: 2023-05-28 | Discharge: 2023-05-28 | Disposition: A | Discharge status: Home / Self Care | Payer: BC Managed Care – PPO | Attending: Internal Medicine | Admitting: Internal Medicine

## 2023-05-28 ENCOUNTER — Encounter (HOSPITAL_COMMUNITY)
Admission: RE | Disposition: A | Discharge status: Home / Self Care | Payer: Self-pay | Source: Home / Self Care | Attending: Internal Medicine

## 2023-05-28 ENCOUNTER — Other Ambulatory Visit (HOSPITAL_BASED_OUTPATIENT_CLINIC_OR_DEPARTMENT_OTHER): Payer: Self-pay

## 2023-05-28 DIAGNOSIS — Z853 Personal history of malignant neoplasm of breast: Secondary | ICD-10-CM | POA: Insufficient documentation

## 2023-05-28 DIAGNOSIS — I340 Nonrheumatic mitral (valve) insufficiency: Secondary | ICD-10-CM | POA: Diagnosis not present

## 2023-05-28 HISTORY — PX: RIGHT/LEFT HEART CATH AND CORONARY ANGIOGRAPHY: CATH118266

## 2023-05-28 LAB — POCT I-STAT EG7
Acid-base deficit: 4 mmol/L — ABNORMAL HIGH (ref 0.0–2.0)
Acid-base deficit: 7 mmol/L — ABNORMAL HIGH (ref 0.0–2.0)
Bicarbonate: 19.6 mmol/L — ABNORMAL LOW (ref 20.0–28.0)
Bicarbonate: 22.6 mmol/L (ref 20.0–28.0)
Calcium, Ion: 1.11 mmol/L — ABNORMAL LOW (ref 1.15–1.40)
Calcium, Ion: 1.21 mmol/L (ref 1.15–1.40)
HCT: 36 % (ref 36.0–46.0)
HCT: 36 % (ref 36.0–46.0)
Hemoglobin: 12.2 g/dL (ref 12.0–15.0)
Hemoglobin: 12.2 g/dL (ref 12.0–15.0)
O2 Saturation: 72 %
O2 Saturation: 75 %
Potassium: 3.5 mmol/L (ref 3.5–5.1)
Potassium: 4 mmol/L (ref 3.5–5.1)
Sodium: 130 mmol/L — ABNORMAL LOW (ref 135–145)
Sodium: 141 mmol/L (ref 135–145)
TCO2: 21 mmol/L — ABNORMAL LOW (ref 22–32)
TCO2: 24 mmol/L (ref 22–32)
pCO2, Ven: 44.3 mm[Hg] (ref 44–60)
pCO2, Ven: 45.9 mm[Hg] (ref 44–60)
pH, Ven: 7.254 (ref 7.25–7.43)
pH, Ven: 7.302 (ref 7.25–7.43)
pO2, Ven: 44 mm[Hg] (ref 32–45)
pO2, Ven: 44 mm[Hg] (ref 32–45)

## 2023-05-28 LAB — POCT I-STAT 7, (LYTES, BLD GAS, ICA,H+H)
Acid-base deficit: 4 mmol/L — ABNORMAL HIGH (ref 0.0–2.0)
Bicarbonate: 21.1 mmol/L (ref 20.0–28.0)
Calcium, Ion: 1.21 mmol/L (ref 1.15–1.40)
HCT: 36 % (ref 36.0–46.0)
Hemoglobin: 12.2 g/dL (ref 12.0–15.0)
O2 Saturation: 96 %
Potassium: 4 mmol/L (ref 3.5–5.1)
Sodium: 140 mmol/L (ref 135–145)
TCO2: 22 mmol/L (ref 22–32)
pCO2 arterial: 38.7 mm[Hg] (ref 32–48)
pH, Arterial: 7.345 — ABNORMAL LOW (ref 7.35–7.45)
pO2, Arterial: 89 mm[Hg] (ref 83–108)

## 2023-05-28 SURGERY — RIGHT/LEFT HEART CATH AND CORONARY ANGIOGRAPHY
Anesthesia: LOCAL

## 2023-05-28 MED ORDER — MIDAZOLAM HCL 2 MG/2ML IJ SOLN
INTRAMUSCULAR | Status: AC
  Filled 2023-05-28: qty 2

## 2023-05-28 MED ORDER — HEPARIN SODIUM (PORCINE) 1000 UNIT/ML IJ SOLN
INTRAMUSCULAR | Status: AC
  Filled 2023-05-28: qty 10

## 2023-05-28 MED ORDER — SODIUM CHLORIDE 0.9% FLUSH: 3.0000 mL | INTRAVENOUS | Status: DC | PRN

## 2023-05-28 MED ORDER — ASPIRIN 81 MG PO CHEW: 81.0000 mg | CHEWABLE_TABLET | ORAL | Status: DC

## 2023-05-28 MED ORDER — SODIUM CHLORIDE 0.9 % WEIGHT BASED INFUSION
3.0000 mL/kg/h | INTRAVENOUS | Status: AC
  Administered 2023-05-28: 3 mL/kg/h via INTRAVENOUS

## 2023-05-28 MED ORDER — SODIUM CHLORIDE 0.9 % WEIGHT BASED INFUSION: 1.0000 mL/kg/h | INTRAVENOUS | Status: DC

## 2023-05-28 MED ORDER — ONDANSETRON HCL 4 MG/2ML IJ SOLN: 4.0000 mg | Freq: Four times a day (QID) | INTRAMUSCULAR | Status: DC | PRN

## 2023-05-28 MED ORDER — VERAPAMIL HCL 2.5 MG/ML IV SOLN
INTRAVENOUS | Status: AC
Start: 2023-05-28 — End: ?
  Filled 2023-05-28: qty 2

## 2023-05-28 MED ORDER — LIDOCAINE HCL (PF) 1 % IJ SOLN
INTRAMUSCULAR | Status: DC | PRN
  Administered 2023-05-28: 1 mL
  Administered 2023-05-28: 2 mL

## 2023-05-28 MED ORDER — LIDOCAINE HCL (PF) 1 % IJ SOLN
INTRAMUSCULAR | Status: AC
Start: 2023-05-28 — End: ?
  Filled 2023-05-28: qty 30

## 2023-05-28 MED ORDER — SODIUM CHLORIDE 0.9 % IV SOLN: 250.0000 mL | INTRAVENOUS | Status: DC | PRN

## 2023-05-28 MED ORDER — LABETALOL HCL 5 MG/ML IV SOLN: 10.0000 mg | INTRAVENOUS | Status: DC | PRN

## 2023-05-28 MED ORDER — HYDRALAZINE HCL 20 MG/ML IJ SOLN: 10.0000 mg | INTRAMUSCULAR | Status: DC | PRN

## 2023-05-28 MED ORDER — SODIUM CHLORIDE 0.9% FLUSH: 3.0000 mL | Freq: Two times a day (BID) | INTRAVENOUS | Status: DC

## 2023-05-28 MED ORDER — FENTANYL CITRATE (PF) 100 MCG/2ML IJ SOLN
INTRAMUSCULAR | Status: AC
  Filled 2023-05-28: qty 2

## 2023-05-28 MED ORDER — HEPARIN (PORCINE) IN NACL 1000-0.9 UT/500ML-% IV SOLN
INTRAVENOUS | Status: DC | PRN
  Administered 2023-05-28 (×2): 500 mL

## 2023-05-28 MED ORDER — FENTANYL CITRATE (PF) 100 MCG/2ML IJ SOLN
INTRAMUSCULAR | Status: DC | PRN
  Administered 2023-05-28: 25 ug via INTRAVENOUS

## 2023-05-28 MED ORDER — IOHEXOL 350 MG/ML SOLN
INTRAVENOUS | Status: DC | PRN
  Administered 2023-05-28: 35 mL

## 2023-05-28 MED ORDER — HEPARIN SODIUM (PORCINE) 1000 UNIT/ML IJ SOLN
INTRAMUSCULAR | Status: DC | PRN
  Administered 2023-05-28: 5000 [IU] via INTRAVENOUS

## 2023-05-28 MED ORDER — MIDAZOLAM HCL 2 MG/2ML IJ SOLN
INTRAMUSCULAR | Status: DC | PRN
  Administered 2023-05-28: 1 mg via INTRAVENOUS

## 2023-05-28 MED ORDER — VERAPAMIL HCL 2.5 MG/ML IV SOLN
INTRAVENOUS | Status: DC | PRN
  Administered 2023-05-28: 5 mL via INTRA_ARTERIAL

## 2023-05-28 MED ORDER — CLONAZEPAM 1 MG PO TABS
1.0000 mg | ORAL_TABLET | Freq: Two times a day (BID) | ORAL | 0 refills | Status: DC | PRN
  Filled 2023-05-28: qty 60, 30d supply, fill #0

## 2023-05-28 MED ORDER — ACETAMINOPHEN 325 MG PO TABS: 650.0000 mg | ORAL_TABLET | ORAL | Status: DC | PRN

## 2023-05-28 SURGICAL SUPPLY — 12 items
CATH BALLN WEDGE 5F 110CM (CATHETERS) IMPLANT
CATH INFINITI 5 FR JL3.5 (CATHETERS) IMPLANT
CATH INFINITI 5FR ANG PIGTAIL (CATHETERS) IMPLANT
CATH INFINITI AMBI 6FR TG (CATHETERS) IMPLANT
DEVICE RAD COMP TR BAND LRG (VASCULAR PRODUCTS) IMPLANT
GLIDESHEATH SLEND SS 6F .021 (SHEATH) IMPLANT
KIT SYRINGE INJ CVI SPIKEX1 (MISCELLANEOUS) IMPLANT
PACK CARDIAC CATHETERIZATION (CUSTOM PROCEDURE TRAY) ×1 IMPLANT
SET ATX-X65L (MISCELLANEOUS) IMPLANT
SHEATH GLIDE SLENDER 4/5FR (SHEATH) IMPLANT
SHEATH PROBE COVER 6X72 (BAG) IMPLANT
WIRE EMERALD 3MM-J .035X260CM (WIRE) IMPLANT

## 2023-05-28 NOTE — Interval H&P Note (Signed)
History and Physical Interval Note:  05/28/2023 6:28 AM  Tracy Dyer  has presented today for surgery, with the diagnosis of severe MR.  The various methods of treatment have been discussed with the patient and family. After consideration of risks, benefits and other options for treatment, the patient has consented to  Procedure(s): RIGHT/LEFT HEART CATH AND CORONARY ANGIOGRAPHY (N/A) as a surgical intervention.  The patient's history has been reviewed, patient examined, no change in status, stable for surgery.  I have reviewed the patient's chart and labs.  Questions were answered to the patient's satisfaction.     [name]

## 2023-05-28 NOTE — Discharge Instructions (Signed)
Radial Site Care  This sheet gives you information about how to care for yourself after your procedure. Your health care provider may also give you more specific instructions. If you have problems or questions, contact your health care provider. What can I expect after the procedure? After the procedure, it is common to have: Bruising and tenderness at the catheter insertion area. Follow these instructions at home: Medicines Take over-the-counter and prescription medicines only as told by your health care provider. Insertion site care Follow instructions from your health care provider about how to take care of your insertion site. Make sure you: Wash your hands with soap and water before you remove your bandage (dressing). If soap and water are not available, use hand sanitizer. May remove dressing in 24 hours. Check your insertion site every day for signs of infection. Check for: Redness, swelling, or pain. Fluid or blood. Pus or a bad smell. Warmth. Do no take baths, swim, or use a hot tub for 5 days. You may shower 24-48 hours after the procedure. Remove the dressing and gently wash the site with plain soap and water. Pat the area dry with a clean towel. Do not rub the site. That could cause bleeding. Do not apply powder or lotion to the site. Activity  For 24 hours after the procedure, or as directed by your health care provider: Do not flex or bend the affected arm. Do not push or pull heavy objects with the affected arm. Do not drive yourself home from the hospital or clinic. You may drive 24 hours after the procedure. Do not operate machinery or power tools. KEEP ARM ELEVATED THE REMAINDER OF THE DAY. Do not push, pull or lift anything that is heavier than 10 lb for 5 days. Ask your health care provider when it is okay to: Return to work or school. Resume usual physical activities or sports. Resume sexual activity. General instructions If the catheter site starts to  bleed, raise your arm and put firm pressure on the site. If the bleeding does not stop, get help right away. This is a medical emergency. DRINK PLENTY OF FLUIDS FOR THE NEXT 2-3 DAYS. No alcohol consumption for 24 hours after receiving sedation. If you went home on the same day as your procedure, a responsible adult should be with you for the first 24 hours after you arrive home. Keep all follow-up visits as told by your health care provider. This is important. Contact a health care provider if: You have a fever. You have redness, swelling, or yellow drainage around your insertion site. Get help right away if: You have unusual pain at the radial site. The catheter insertion area swells very fast. The insertion area is bleeding, and the bleeding does not stop when you hold steady pressure on the area. Your arm or hand becomes pale, cool, tingly, or numb. These symptoms may represent a serious problem that is an emergency. Do not wait to see if the symptoms will go away. Get medical help right away. Call your local emergency services (911 in the [place] Do not drive yourself to the hospital. Summary After the procedure, it is common to have bruising and tenderness at the site. Follow instructions from your health care provider about how to take care of your radial site wound. Check the wound every day for signs of infection.  This information is not intended to replace advice given to you by your health care provider. Make sure you discuss any questions you have with   your health care provider. Document Revised: 08/25/2017 Document Reviewed: 08/25/2017 Elsevier Patient Education  2020 [employer]   Brachial Site Care   This sheet gives you information about how to care for yourself after your procedure. Your health care provider may also give you more specific instructions. If you have problems or questions, contact your health care provider. What can I expect after the procedure? After the  procedure, it is common to have: Bruising and tenderness at the catheter insertion area. Follow these instructions at home:  Insertion site care Follow instructions from your health care provider about how to take care of your insertion site. Make sure you: Wash your hands with soap and water before you change your bandage (dressing). If soap and water are not available, use hand sanitizer. Remove your dressing as told by your health care provider. In 24 hours Check your insertion site every day for signs of infection. Check for: Redness, swelling, or pain. Pus or a bad smell. Warmth. You may shower 24-48 hours after the procedure. Do not apply powder or lotion to the site.  Activity For 24 hours after the procedure, or as directed by your health care provider: Do not push or pull heavy objects with the affected arm. Do not drive yourself home from the hospital or clinic. You may drive 24 hours after the procedure unless your health care provider tells you not to. Do not lift anything that is heavier than 10 lb (4.5 kg), or the limit that you are told, until your health care provider says that it is safe.  For 24 hours   

## 2023-05-30 NOTE — Progress Notes (Unsigned)
[address]       [place] [address]             [phone]           Genese Hibbett [facility] Medical Record #[id number] Date of Birth: 06-Mar-1971  Maisie Fus, MD Jettie Pagan, NP  Chief Complaint:   MV regurgitation  History of Present Illness:     Pt is a 53 yo female who reportedly was told had mitral valve issues and regurgitation for years. Pt had done well until recently when in [place] had an event where she "passed out' and was found to have what she thought was a low heart rate and pressure. She also feels some "electrical" pulse on occasion in her chest up towards the neck area and a "buzziness". She has no appreciable DOE or fatigue. She has had COVID three times and feels has had lower heart rates and BP since. She has had breast cancer sp lumpectomy, aggressive chemo and RT therapy. She also has a hypercoagulable state inherited where she need to be on heparin shots during her pregnancy. 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 She has had a R and L cath without PHTN nor CAD. Pt presents for surgical consideration      Past Medical History:  Diagnosis Date   Anxiety    Cancer (HCC)    breast   Depression    Family history of breast cancer    Family history of lung cancer    Family history of ovarian cancer    GERD (gastroesophageal reflux disease)    diet controlled   History of radiation therapy    Left breast, left axilla- 05/27/21-07/10/21- Dr. Antony Blackbird   Seasonal allergies     Past Surgical History:  Procedure Laterality Date   BREAST BIOPSY Left 2019   x 2 biopsy   BREAST EXCISIONAL BIOPSY Left    BREAST LUMPECTOMY WITH RADIOACTIVE SEED AND SENTINEL LYMPH NODE BIOPSY Left 02/26/2021   Procedure: LEFT BREAST LUMPECTOMY WITH RADIOACTIVE SEED x2; LEFT AXILLARY SENTINEL NODE BIOPSY;  Surgeon: Emelia Loron, MD;  Location: [facility];   Service: General;  Laterality: Left;   BREAST LUMPECTOMY WITH RADIOFREQUENCY TAG IDENTIFICATION Left 04/18/2021   Procedure: LEFT BREAST MRI WIRE GUIDED EXCISION;  Surgeon: Emelia Loron, MD;  Location: [facility];  Service: General;  Laterality: Left;   BREAST REDUCTION SURGERY Bilateral 04/18/2021   Procedure: MAMMARY REDUCTION  (BREAST) LEFT ONCOPLASTIC BREAST RECONSTRUCTION, RIGHT BREAST REDUCTION;  Surgeon: Glenna Fellows, MD;  Location: [facility];  Service: Plastics;  Laterality: Bilateral;   CHOLECYSTECTOMY  2008   PORT-A-CATH REMOVAL Right 04/18/2021   Procedure: REMOVAL PORT-A-CATH;  Surgeon: Emelia Loron, MD;  Location: [facility];  Service: General;  Laterality: Right;   PORTACATH PLACEMENT N/A 09/05/2020   Procedure: INSERTION PORT-A-CATH;  Surgeon: Emelia Loron, MD;  Location: [facility];  Service: General;  Laterality: N/A;  PEC BLOCK   TEE WITHOUT CARDIOVERSION N/A 05/14/2023   Procedure: TRANSESOPHAGEAL ECHOCARDIOGRAM;  Surgeon: Maisie Fus, MD;  Location: [facility];  Service: Cardiovascular;  Laterality: N/A;   WISDOM TOOTH EXTRACTION      Social History   Tobacco Use  Smoking Status Never  Smokeless Tobacco Never    Social History   Substance and Sexual Activity  Alcohol Use Not Currently    Social History  Socioeconomic History   Marital status: Married    Spouse name: Not on file   Number of children: Not on file   Years of education: Not on file   Highest education level: Not on file  Occupational History   Not on file  Tobacco Use   Smoking status: Never   Smokeless tobacco: Never  Vaping Use   Vaping status: Never Used  Substance and Sexual Activity   Alcohol use: Not Currently   Drug use: Never   Sexual activity: Yes    Birth control/protection: None  Other Topics Concern   Not on file  Social History Narrative   Not on file   Social Determinants of Health   Financial  Resource Strain: Patient Declined (08/14/2022)   Received from [facility]   Overall Financial Resource Strain (CARDIA)    Difficulty of Paying Living Expenses: Patient declined  Food Insecurity: Patient Declined (04/18/2023)   Received from [facility]   Hunger Vital Sign    Worried About Running Out of Food in the Last Year: Patient declined    Ran Out of Food in the Last Year: Patient declined  Transportation Needs: Patient Declined (04/18/2023)   Received from [facility] - Transportation    Lack of Transportation (Medical): Patient declined    Lack of Transportation (Non-Medical): Patient declined  Physical Activity: Unknown (04/18/2023)   Received from [facility]   Exercise Vital Sign    Days of Exercise per Week: Patient declined    Minutes of Exercise per Session: 80 min  Stress: Patient Declined (04/18/2023)   Received from [facility] - Occupational Stress Questionnaire    Feeling of Stress : Patient declined  Social Connections: Moderately Integrated (04/18/2023)   Received from [facility]   Social Network    How would you rate your social network (family, work, friends)?: Adequate participation with social networks  Intimate Partner Violence: Not At Risk (04/18/2023)   Received from [facility]   HITS    Over the last 12 months how often did your partner physically hurt you?: 1    Over the last 12 months how often did your partner insult you or talk down to you?: 1    Over the last 12 months how often did your partner threaten you with physical harm?: 1    Over the last 12 months how often did your partner scream or curse at you?: 1    Allergies  Allergen Reactions   Sulfa Antibiotics     Rash, urticarial   Propoxyphene Anxiety, Rash and Other (See Comments)   Sulfamethoxazole Rash    Feel sun burn    Current Outpatient Medications  Medication Sig Dispense Refill   B Complex-C-Iron  TABS Take 1 tablet by mouth daily.     clonazePAM (KLONOPIN) 1 MG tablet Take 1 tablet (1 mg total) by mouth 2 (two) times daily as needed. (Patient taking differently: Take 1 mg by mouth 2 (two) times daily.) 60 tablet 0   clonazePAM (KLONOPIN) 1 MG tablet Take 1 tablet (1 mg total) by mouth 2 (two) times daily as needed for anxiety. 60 tablet 0   DHA-EPA-Coenzyme Q10-Vitamin E (COQ-10 & FISH OIL PO) Take 3 capsules by mouth daily.     estradiol (ESTRACE VAGINAL) 0.1 MG/GM vaginal cream Place 1 Applicatorful vaginally 3 (three) times a week. 42.5 g 12   fluticasone (FLONASE) 50 MCG/ACT nasal spray Place 1  spray into both nostrils daily as needed for allergies.     MAGNESIUM PO Take 1 tablet by mouth daily.     naproxen sodium (ALEVE) 220 MG tablet Take 220 mg by mouth daily as needed.     Nutritional Supplements (NUTRITIONAL SUPPLEMENT PO) Take 1 Scoop by mouth daily.     OVER THE COUNTER MEDICATION Take 4 capsules by mouth daily. "Nutrafol": Vitamin A, Vitamin C, Vitamin D, Vitamin E, Biotin, Calcium, Iodine, Zinc, Selenium, Potassium, Maca Powder, Saw Palmetto extract, ashwaganda, hydrolized marine collagen, turmeric extract, palm extract, haematococcspluvlalis (algae), l-lysine, l-methionine, l-cystine, horse tail extract, japanese knotweed, black pepper extract, cayenne extract     sertraline (ZOLOFT) 100 MG tablet Take one tablet (100 mg dose) by mouth at bedtime. 90 tablet 1   tirzepatide (ZEPBOUND) 15 MG/0.5ML Pen Inject 15 mg into the skin once a week. 6 mL 0   tirzepatide (ZEPBOUND) 15 MG/0.5ML Pen Inject 15 mg into the skin every 7 (seven) days. 6 mL 0   UNABLE TO FIND Take 2 capsules by mouth daily. Omega 7 (newchapter brand)     Vitamin D-Vitamin K (VITAMIN D2 + K1 PO) Take 2 drops by mouth daily. Liquid     No current facility-administered medications for this visit.     Family History  Problem Relation Age of Onset   Eczema Father    Allergic rhinitis Brother    Breast cancer  Mother        in 46's   Cancer Maternal Uncle        unk type   Lung cancer Maternal Grandfather 42   Heart Problems Paternal Grandfather    Melanoma Cousin    Ovarian cancer Paternal Great-grandmother    Cancer Paternal Great-grandmother        GYN cancer, possibly ovarian?   Cancer Maternal Aunt        half aunts/uncles x5- rare cancers       Physical Exam: LMP 09/05/2020 (Exact Date)  Teeth in good repair Lungs: Clear Card: RR with somewhat later appearing 3/6 sem Ext: no edema Neuro: intact    Diagnostic Studies & Laboratory data: I have personally reviewed the following studies and agree with the findings   TEE (05/2023) IMPRESSIONS     1. Left ventricular ejection fraction, by estimation, is 60 to 65%. The  left ventricle has normal function.   2. Right ventricular systolic function is normal. The right ventricular  size is normal.   3. No left atrial/left atrial appendage thrombus was detected.   4. There are multiple jets, EROA may be underestimating the severity of  regurgitation. 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   5. The aortic valve is normal in structure. Aortic valve regurgitation is  not visualized.   Conclusion(s)/Recommendation(s): Referral sent to Dr. Leafy Ro.   FINDINGS   Left Ventricle: Left ventricular ejection fraction, by estimation, is 60  to 65%. The left ventricle has normal function. The left ventricular  internal cavity size was normal in size.   Right Ventricle: The right ventricular size is normal. Right ventricular  systolic function is normal.   Left Atrium: No left atrial/left atrial appendage thrombus was detected.   Mitral Valve: There are multiple jets, EROA may be underestimating the  severity of regurgitation. 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 Moderate to  severe mitral  valve regurgitation.   Tricuspid  Valve: The tricuspid valve is normal in structure. Tricuspid  valve regurgitation is trivial.   Aortic Valve: The aortic valve is normal in structure. Aortic valve  regurgitation is not visualized.   Pulmonic Valve: The pulmonic valve was normal in structure. Pulmonic valve  regurgitation is not visualized.   Aorta: The aortic root and ascending aorta are structurally normal, with  no evidence of dilitation. There is minimal (Grade I) plaque.   IAS/Shunts: No atrial level shunt detected by color flow Doppler.     MR Peak grad:    106.5 mmHg  MR Mean grad:    81.0 mmHg  MR Vmax:         516.00 cm/s  MR Vmean:        435.0 cm/s  MR PISA:         3.08 cm  MR PISA Eff ROA: 23 mm  MR PISA Radius:  0.70 cm   Cath (05/2023) Conclusion  1.  Normal right dominant coronary circulation. 2.  Fick cardiac output of 6 L/min and Fick cardiac index of 3.4 L/min with the following hemodynamics:             Right atrial pressure mean of 9 mmHg             Right ventricular pressure 27/7 with end-diastolic pressure of 12 mmHg             Wedge pressure mean of 11 mmHg with V waves to 12 mmHg             Pulmonary artery pressure 27/11 with a mean of 18 mmHg             PVR of 1.2 Woods units             PA pulsatility index of 1.8 3.  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 He symptoms are difficult to assess to the MV  however the severity of the regurgitation seems to coincide with her symptoms of lightheadedness. We discussed the pathology of her valve and the increased difficulty in repair and possible need for mechanical valve replacement. She understands the risks, goals and recovery and would like to proceed as schedule permits. Will operate on 12/6 and will obtain preoperative studies of carotid duplex. She has a ct of chest in the last two years which suffices.    I have spent 60 min in review of the records, viewing studies and in face to face with patient and in coordination of future care    [name] 05/30/2023 10:21 AM

## 2023-05-31 ENCOUNTER — Encounter: Payer: Self-pay | Admitting: *Deleted

## 2023-05-31 ENCOUNTER — Other Ambulatory Visit: Payer: Self-pay | Admitting: *Deleted

## 2023-05-31 ENCOUNTER — Institutional Professional Consult (permissible substitution): Payer: BC Managed Care – PPO | Admitting: Thoracic Surgery (Cardiothoracic Vascular Surgery)

## 2023-05-31 ENCOUNTER — Encounter (HOSPITAL_COMMUNITY): Payer: Self-pay | Admitting: Internal Medicine

## 2023-05-31 VITALS — BP 113/73 | HR 58 | Resp 18 | Ht 66.0 in | Wt 153.0 lb

## 2023-05-31 DIAGNOSIS — I341 Nonrheumatic mitral (valve) prolapse: Secondary | ICD-10-CM | POA: Diagnosis not present

## 2023-05-31 DIAGNOSIS — I340 Nonrheumatic mitral (valve) insufficiency: Secondary | ICD-10-CM

## 2023-05-31 DIAGNOSIS — Z01419 Encounter for gynecological examination (general) (routine) without abnormal findings: Secondary | ICD-10-CM | POA: Diagnosis not present

## 2023-05-31 DIAGNOSIS — Z124 Encounter for screening for malignant neoplasm of cervix: Secondary | ICD-10-CM | POA: Diagnosis not present

## 2023-05-31 NOTE — Patient Instructions (Signed)
Carotid duplex MV repair on 12/6

## 2023-06-01 DIAGNOSIS — Z85828 Personal history of other malignant neoplasm of skin: Secondary | ICD-10-CM | POA: Diagnosis not present

## 2023-06-01 DIAGNOSIS — Z08 Encounter for follow-up examination after completed treatment for malignant neoplasm: Secondary | ICD-10-CM | POA: Diagnosis not present

## 2023-06-01 DIAGNOSIS — L910 Hypertrophic scar: Secondary | ICD-10-CM | POA: Diagnosis not present

## 2023-06-04 ENCOUNTER — Encounter: Payer: Self-pay | Admitting: Internal Medicine

## 2023-06-04 DIAGNOSIS — M9907 Segmental and somatic dysfunction of upper extremity: Secondary | ICD-10-CM | POA: Diagnosis not present

## 2023-06-04 DIAGNOSIS — M9901 Segmental and somatic dysfunction of cervical region: Secondary | ICD-10-CM | POA: Diagnosis not present

## 2023-06-04 DIAGNOSIS — M9902 Segmental and somatic dysfunction of thoracic region: Secondary | ICD-10-CM | POA: Diagnosis not present

## 2023-06-04 DIAGNOSIS — M7541 Impingement syndrome of right shoulder: Secondary | ICD-10-CM | POA: Diagnosis not present

## 2023-06-04 DIAGNOSIS — M9903 Segmental and somatic dysfunction of lumbar region: Secondary | ICD-10-CM | POA: Diagnosis not present

## 2023-06-07 ENCOUNTER — Other Ambulatory Visit: Payer: Self-pay

## 2023-06-07 DIAGNOSIS — C50412 Malignant neoplasm of upper-outer quadrant of left female breast: Secondary | ICD-10-CM

## 2023-06-08 ENCOUNTER — Encounter: Payer: Self-pay | Admitting: Adult Health

## 2023-06-08 ENCOUNTER — Inpatient Hospital Stay: Payer: BC Managed Care – PPO | Admitting: Adult Health

## 2023-06-08 ENCOUNTER — Inpatient Hospital Stay: Payer: BC Managed Care – PPO | Attending: Adult Health

## 2023-06-08 VITALS — BP 108/58 | HR 52 | Temp 97.7°F | Resp 18 | Ht 66.0 in | Wt 153.0 lb

## 2023-06-08 DIAGNOSIS — I38 Endocarditis, valve unspecified: Secondary | ICD-10-CM | POA: Diagnosis not present

## 2023-06-08 DIAGNOSIS — Z803 Family history of malignant neoplasm of breast: Secondary | ICD-10-CM | POA: Insufficient documentation

## 2023-06-08 DIAGNOSIS — Z9221 Personal history of antineoplastic chemotherapy: Secondary | ICD-10-CM | POA: Insufficient documentation

## 2023-06-08 DIAGNOSIS — Z801 Family history of malignant neoplasm of trachea, bronchus and lung: Secondary | ICD-10-CM | POA: Diagnosis not present

## 2023-06-08 DIAGNOSIS — C50412 Malignant neoplasm of upper-outer quadrant of left female breast: Secondary | ICD-10-CM

## 2023-06-08 DIAGNOSIS — Z170 Estrogen receptor positive status [ER+]: Secondary | ICD-10-CM

## 2023-06-08 DIAGNOSIS — Z923 Personal history of irradiation: Secondary | ICD-10-CM | POA: Diagnosis not present

## 2023-06-08 DIAGNOSIS — Z853 Personal history of malignant neoplasm of breast: Secondary | ICD-10-CM | POA: Diagnosis not present

## 2023-06-08 DIAGNOSIS — R634 Abnormal weight loss: Secondary | ICD-10-CM | POA: Diagnosis not present

## 2023-06-08 DIAGNOSIS — Z8041 Family history of malignant neoplasm of ovary: Secondary | ICD-10-CM | POA: Insufficient documentation

## 2023-06-08 LAB — CBC WITH DIFFERENTIAL (CANCER CENTER ONLY)
Abs Immature Granulocytes: 0.01 10*3/uL (ref 0.00–0.07)
Basophils Absolute: 0 10*3/uL (ref 0.0–0.1)
Basophils Relative: 1 %
Eosinophils Absolute: 0.1 10*3/uL (ref 0.0–0.5)
Eosinophils Relative: 2 %
HCT: 38.5 % (ref 36.0–46.0)
Hemoglobin: 13.5 g/dL (ref 12.0–15.0)
Immature Granulocytes: 0 %
Lymphocytes Relative: 32 %
Lymphs Abs: 1.6 10*3/uL (ref 0.7–4.0)
MCH: 31.6 pg (ref 26.0–34.0)
MCHC: 35.1 g/dL (ref 30.0–36.0)
MCV: 90.2 fL (ref 80.0–100.0)
Monocytes Absolute: 0.4 10*3/uL (ref 0.1–1.0)
Monocytes Relative: 8 %
Neutro Abs: 2.9 10*3/uL (ref 1.7–7.7)
Neutrophils Relative %: 57 %
Platelet Count: 177 10*3/uL (ref 150–400)
RBC: 4.27 MIL/uL (ref 3.87–5.11)
RDW: 13.1 % (ref 11.5–15.5)
WBC Count: 5 10*3/uL (ref 4.0–10.5)
nRBC: 0 % (ref 0.0–0.2)

## 2023-06-08 LAB — CMP (CANCER CENTER ONLY)
ALT: 29 U/L (ref 0–44)
AST: 25 U/L (ref 15–41)
Albumin: 4.1 g/dL (ref 3.5–5.0)
Alkaline Phosphatase: 81 U/L (ref 38–126)
Anion gap: 3 — ABNORMAL LOW (ref 5–15)
BUN: 13 mg/dL (ref 6–20)
CO2: 30 mmol/L (ref 22–32)
Calcium: 9.2 mg/dL (ref 8.9–10.3)
Chloride: 101 mmol/L (ref 98–111)
Creatinine: 0.75 mg/dL (ref 0.44–1.00)
GFR, Estimated: >60 mL/min (ref >60)
Glucose, Bld: 85 mg/dL (ref 70–99)
Potassium: 4.3 mmol/L (ref 3.5–5.1)
Sodium: 134 mmol/L — ABNORMAL LOW (ref 135–145)
Total Bilirubin: 0.5 mg/dL (ref <1.2)
Total Protein: 6.5 g/dL (ref 6.5–8.1)

## 2023-06-08 NOTE — Progress Notes (Signed)
[facility] Cancer Follow up:    Tracy Pagan, NP [address] [place] [address]   DIAGNOSIS:  Cancer Staging  Malignant neoplasm of upper-outer quadrant of left breast in female, estrogen receptor positive (HCC) Staging form: Breast, AJCC 8th Edition - Clinical stage from 08/21/2020: Stage IIA (cT1c, cN1, cM0, G3, ER+, PR+, HER2-) - Signed by Serena Croissant, MD on 08/21/2020 - Pathologic stage from 02/26/2021: No Stage Recommended (ypT0, pN0, cM0) - Signed by Loa Socks, NP on 12/04/2021 Stage prefix: Post-therapy   SUMMARY OF ONCOLOGIC HISTORY: Oncology History  Malignant neoplasm of upper-outer quadrant of left breast in female, estrogen receptor positive (HCC)  08/21/2020 Initial Diagnosis   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%, PR 95%, HER2 equivocal, FISH pending, Ki-67 20%   08/21/2020 Cancer Staging   Staging form: Breast, AJCC 8th Edition - Clinical stage from 08/21/2020: Stage IIA (cT1c, cN1, cM0, G3, ER+, PR+, HER2-) - Signed by Serena Croissant, MD on 08/21/2020   09/04/2020 Genetic Testing   Negative genetic testing. No pathogenic variants identified on the [facility] Breast Cancer STAT Panel + Multi-Cancer Panel. VUS in [facility] called c.983C>T identified. The report date is 09/04/2020.   The STAT Breast cancer panel offered by [facility] includes sequencing and rearrangement analysis for the following 9 genes:  ATM, BRCA1, BRCA2, CDH1, CHEK2, PALB2, PTEN, STK11 and TP53.    The Multi-Cancer Panel offered by [facility] includes sequencing and/or deletion duplication testing of the following 85 genes: AIP, ALK, APC, ATM, AXIN2,BAP1,  BARD1, BLM, BMPR1A,  BRCA1, BRCA2, BRIP1, CASR, CDC73, CDH1, CDK4, CDKN1B, CDKN1C, CDKN2A (p14ARF), CDKN2A (p16INK4a), CEBPA, CHEK2, CTNNA1, DICER1, DIS3L2, EGFR (c.2369C>T, p.Thr790Met variant only), EPCAM (Deletion/duplication testing only), FH, FLCN, GATA2, GPC3, GREM1 (Promoter region deletion/duplication testing only), HOXB13 (c.251G>A, p.Gly84Glu), HRAS, KIT, MAX, MEN1, MET, MITF (c.952G>A, p.Glu318Lys variant only), MLH1, MSH2, MSH3, MSH6, MUTYH, NBN, NF1, NF2, NTHL1, PALB2, PDGFRA, PHOX2B, PMS2, POLD1, POLE, POT1, PRKAR1A, PTCH1, PTEN, RAD50, RAD51C, RAD51D, RB1, RECQL4, RET, RNF43, RUNX1, SDHAF2, SDHA (sequence changes only), SDHB, SDHC, SDHD, SMAD4, SMARCA4, SMARCB1, SMARCE1, STK11, SUFU, TERC, TERT, TMEM127, TP53, TSC1, TSC2, VHL, WRN and WT1.    09/06/2020 - 01/17/2021 Chemotherapy   AC X 4 foll by Taxol       02/26/2021 Surgery   Left lumpectomy: no residual carcinoma with margins and axillary lymph nodes also negative for carcinoma     02/26/2021 Cancer Staging   Staging form: Breast, AJCC 8th Edition - Pathologic stage from 02/26/2021: No Stage Recommended (ypT0, pN0, cM0) - Signed by Loa Socks, NP on 12/04/2021 Stage prefix: Post-therapy   04/18/2021 Surgery   Removal of retained clip, port removal and bilateral breast mammoplasty    05/27/2021 - 07/10/2021 Radiation Therapy    Site Technique Total Dose (Gy) Dose per Fx (Gy) Completed Fx Beam Energies  Breast, Left: Breast_Lt 3D 50.4/50.4 1.8 28/28 6X, 10X  Axilla, Left: Axilla_Lt 3D 50.4/50.4 1.8 28/28 6X, 15X     08/2021 -  Anti-estrogen oral therapy   Unable to tolerate tamoxifen or letrozole, changed to exemestane daily 09/18/21     CURRENT THERAPY: observation  INTERVAL HISTORY:  Discussed the use of AI scribe software for clinical note transcription with the  patient, who gave verbal consent to proceed.  Tracy Dyer 52 y.o. female returns for her history of breast cancerand recent COVID-19 infection,to f/u on her previous  cardiac concerns.  At her last visit with me her murmur sounded a little bit more pronounced and she was seen by Dr. Wyline Mood quickly and cardiology.  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 The patient describes her condition as having a "leaky heart," with all chordae tendineae on both sides being stretched out. She is scheduled for open-heart surgery in December to address this issue.  The patient also mentions a recent episode similar to a previous fainting incident in [place], which led to an emergency room visit on 05/24/2023. She reports a history of heart rate drops during past COVID-19 infections. The patient has lost significant weight, approximately 60 pounds, which she believes has positively impacted her health status.  The patient also discusses a history of cancer, expressing concern about the upcoming surgery potentially "reigniting" the disease. She reports her most recent Signatera test was negative. She also mentions using estradiol cream for vaginal dryness, a side effect of her cancer treatment. She has not yet started using the cream but plans to do so before her cardiac surgery.  The patient's recent blood work shows a slight elevation in liver enzymes, which she attributes to her recent COVID-19 infection. However, her white blood cell count has returned to normal.   Patient Active Problem List   Diagnosis Date Noted   Vitamin D deficiency 05/04/2023   Right lower quadrant pain 05/04/2023   Squamous cell skin cancer 04/19/2023   Cervical radiculopathy 07/08/2022   Occipital neuralgia of right side 07/01/2021   Peripheral vertigo 07/01/2021   Genetic testing 09/04/2020   Family history of breast cancer    Family history of ovarian cancer    Family history of lung cancer    Malignant neoplasm of upper-outer quadrant of left breast in  female, estrogen receptor positive (HCC) 08/21/2020   Headache 04/10/2017   Anxiety 11/16/2013   Hypercoagulable state (HCC) 11/16/2013   TIA (transient ischemic attack) 11/15/2013   Chest discomfort 01/20/2012   Factor V Leiden (HCC) 01/20/2012   MVP (mitral valve prolapse) 01/20/2012   Acute bronchitis 12/23/2011    is allergic to sulfa antibiotics, propoxyphene, and sulfamethoxazole.  MEDICAL HISTORY: Past Medical History:  Diagnosis Date   Anxiety    Cancer (HCC)    breast   Depression    Family history of breast cancer    Family history of lung cancer    Family history of ovarian cancer    GERD (gastroesophageal reflux disease)    diet controlled   History of radiation therapy    Left breast, left axilla- 05/27/21-07/10/21- Dr. Antony Blackbird   Seasonal allergies     SURGICAL HISTORY: Past Surgical History:  Procedure Laterality Date   BREAST BIOPSY Left 2019   x 2 biopsy   BREAST EXCISIONAL BIOPSY Left    BREAST LUMPECTOMY WITH RADIOACTIVE SEED AND SENTINEL LYMPH NODE BIOPSY Left 02/26/2021   Procedure: LEFT BREAST LUMPECTOMY WITH RADIOACTIVE SEED x2; LEFT AXILLARY SENTINEL NODE BIOPSY;  Surgeon: Emelia Loron, MD;  Location: [facility];  Service: General;  Laterality: Left;   BREAST LUMPECTOMY WITH RADIOFREQUENCY TAG IDENTIFICATION Left 04/18/2021   Procedure: LEFT BREAST MRI WIRE GUIDED EXCISION;  Surgeon: Emelia Loron, MD;  Location: [facility];  Service:  General;  Laterality: Left;   BREAST REDUCTION SURGERY Bilateral 04/18/2021   Procedure: MAMMARY REDUCTION  (BREAST) LEFT ONCOPLASTIC BREAST RECONSTRUCTION, RIGHT BREAST REDUCTION;  Surgeon: Glenna Fellows, MD;  Location: [facility];  Service: Plastics;  Laterality: Bilateral;   CHOLECYSTECTOMY  2008   PORT-A-CATH REMOVAL Right 04/18/2021   Procedure: REMOVAL PORT-A-CATH;  Surgeon: Emelia Loron, MD;  Location: [facility];  Service: General;   Laterality: Right;   PORTACATH PLACEMENT N/A 09/05/2020   Procedure: INSERTION PORT-A-CATH;  Surgeon: Emelia Loron, MD;  Location: [facility];  Service: General;  Laterality: N/A;  PEC BLOCK   RIGHT/LEFT HEART CATH AND CORONARY ANGIOGRAPHY N/A 05/28/2023   Procedure: RIGHT/LEFT HEART CATH AND CORONARY ANGIOGRAPHY;  Surgeon: [name], MD;  Location: [facility];  Service: Cardiovascular;  Laterality: N/A;   TEE WITHOUT CARDIOVERSION N/A 05/14/2023   Procedure: TRANSESOPHAGEAL ECHOCARDIOGRAM;  Surgeon: Maisie Fus, MD;  Location: [facility];  Service: Cardiovascular;  Laterality: N/A;   WISDOM TOOTH EXTRACTION      SOCIAL HISTORY: Social History   Socioeconomic History   Marital status: Married    Spouse name: Not on file   Number of children: Not on file   Years of education: Not on file   Highest education level: Not on file  Occupational History   Not on file  Tobacco Use   Smoking status: Never   Smokeless tobacco: Never  Vaping Use   Vaping status: Never Used  Substance and Sexual Activity   Alcohol use: Not Currently   Drug use: Never   Sexual activity: Yes    Birth control/protection: None  Other Topics Concern   Not on file  Social History Narrative   Not on file   Social Determinants of Health   Financial Resource Strain: Patient Declined (08/14/2022)   Received from [facility]   Overall Financial Resource Strain (CARDIA)    Difficulty of Paying Living Expenses: Patient declined  Food Insecurity: Patient Declined (04/18/2023)   Received from [facility]   Hunger Vital Sign    Worried About Running Out of Food in the Last Year: Patient declined    Ran Out of Food in the Last Year: Patient declined  Transportation Needs: Patient Declined (04/18/2023)   Received from [facility] - Transportation    Lack of Transportation (Medical): Patient declined    Lack of Transportation (Non-Medical): Patient declined   Physical Activity: Unknown (04/18/2023)   Received from [facility]   Exercise Vital Sign    Days of Exercise per Week: Patient declined    Minutes of Exercise per Session: 80 min  Stress: Patient Declined (04/18/2023)   Received from [facility] - Occupational Stress Questionnaire    Feeling of Stress : Patient declined  Social Connections: Moderately Integrated (04/18/2023)   Received from [facility]   Social Network    How would you rate your social network (family, work, friends)?: Adequate participation with social networks  Intimate Partner Violence: Not At Risk (04/18/2023)   Received from [facility]   HITS    Over the last 12 months how often did your partner physically hurt you?: 1    Over the last 12 months how often did your partner insult you or talk down to you?: 1    Over the last 12 months how often did your partner threaten you with physical harm?: 1  Over the last 12 months how often did your partner scream or curse at you?: 1    FAMILY HISTORY: Family History  Problem Relation Age of Onset   Eczema Father    Allergic rhinitis Brother    Breast cancer Mother        in 54's   Cancer Maternal Uncle        unk type   Lung cancer Maternal Grandfather 42   Heart Problems Paternal Grandfather    Melanoma Cousin    Ovarian cancer Paternal Great-grandmother    Cancer Paternal Great-grandmother        GYN cancer, possibly ovarian?   Cancer Maternal Aunt        half aunts/uncles x5- rare cancers    Review of Systems  Constitutional:  Negative for appetite change, chills, fatigue, fever and unexpected weight change.  HENT:   Negative for hearing loss, lump/mass and trouble swallowing.   Eyes:  Negative for eye problems and icterus.  Respiratory:  Negative for chest tightness, cough and shortness of breath.   Cardiovascular:  Negative for chest pain, leg swelling and palpitations.  Gastrointestinal:  Negative for  abdominal distention, abdominal pain, constipation, diarrhea, nausea and vomiting.  Endocrine: Negative for hot flashes.  Genitourinary:  Negative for difficulty urinating.   Musculoskeletal:  Negative for arthralgias.  Skin:  Negative for itching and rash.  Neurological:  Negative for dizziness, extremity weakness, headaches and numbness.  Hematological:  Negative for adenopathy. Does not bruise/bleed easily.  Psychiatric/Behavioral:  Negative for depression. The patient is not nervous/anxious.       PHYSICAL EXAMINATION    Vitals:   06/08/23 1348  BP: (!) 108/58  Pulse: (!) 52  Resp: 18  Temp: 97.7 F (36.5 C)  SpO2: 100%   Exam deferred and lieu of counseling   LABORATORY DATA:  CBC    Component Value Date/Time   WBC 5.0 06/08/2023 1331   WBC 4.2 05/24/2023 1151   RBC 4.27 06/08/2023 1331   HGB 13.5 06/08/2023 1331   HGB 14.7 05/07/2023 1026   HCT 38.5 06/08/2023 1331   HCT 43.9 05/07/2023 1026   PLT 177 06/08/2023 1331   PLT 234 05/07/2023 1026   MCV 90.2 06/08/2023 1331   MCV 95 05/07/2023 1026   MCH 31.6 06/08/2023 1331   MCHC 35.1 06/08/2023 1331   RDW 13.1 06/08/2023 1331   RDW 13.0 05/07/2023 1026   LYMPHSABS 1.6 06/08/2023 1331   MONOABS 0.4 06/08/2023 1331   EOSABS 0.1 06/08/2023 1331   BASOSABS 0.0 06/08/2023 1331    CMP     Component Value Date/Time   NA 134 (L) 06/08/2023 1331   NA 139 05/07/2023 1026   K 4.3 06/08/2023 1331   CL 101 06/08/2023 1331   CO2 30 06/08/2023 1331   GLUCOSE 85 06/08/2023 1331   BUN 13 06/08/2023 1331   BUN 11 05/07/2023 1026   CREATININE 0.75 06/08/2023 1331   CALCIUM 9.2 06/08/2023 1331   PROT 6.5 06/08/2023 1331   ALBUMIN 4.1 06/08/2023 1331   AST 25 06/08/2023 1331   ALT 29 06/08/2023 1331   ALKPHOS 81 06/08/2023 1331   BILITOT 0.5 06/08/2023 1331   GFRNONAA >60 06/08/2023 1331    ASSESSMENT and THERAPY PLAN:   Malignant neoplasm of upper-outer quadrant of left breast in female, estrogen receptor  positive (HCC) Screening mammogram detected punctate calcifications left breast UOQ: Stable, interval development of mass UOQ left breast with distortion 1.8 cm (3 cm from nipple),  3 o'clock position 5 cm from nipple 1 cm mass: Biopsy benign, concordant, fibroadenoma, 2 enlarged lymph nodes: Positive, breast biopsy revealed 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  Neoadjuvant chemotherapy with dose dense Adriamycin and Cytoxan followed by Taxol weekly x12 completed 01/17/2021 2. 02/26/2021 breast conserving surgery with sentinel lymph node and targeted node dissection,: Path CR 0/4 LN Neg 04/18/21: Bilateral Mammoplasty: Benign 3.  Adjuvant radiation therapy 05/28/21- 07/10/21 4.  Followed by adjuvant antiestrogen therapy with tamoxifen--unable to tolerate, on Letrozole daily switched to exemestane discontinued for intolerance -------------------------------------------------------------------------------------------------------------------------  PET CT 10/01/21: No evidence of metastatic disease postradiation changes in the lungs Based on severe intolerance antiestrogen therapy we decided not to pursue any further antiestrogen treatments. 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 Open heart surgery scheduled for December 6th. -Continue current management plan as directed by cardiologist.  Breast Cancer Survivor No current concerns. Last Signatera test was negative. -Continue with current follow-up plan. -Schedule mammogram for March 2025 and breast  MRI for September 2025. -Recommended use of estrace cream for vaginal dryness and atrophy.  Weight Loss Significant weight loss achieved, improving overall health. -Continue current weight management strategies.  Follow-up Plans -Follow-up appointment in six months (May 2025).  All questions were answered. The patient knows to call the clinic with any problems, questions or concerns. We can certainly see the patient much sooner if necessary.  Total encounter time:30 minutes*in face-to-face visit time, chart review, lab review, care coordination, order entry, and documentation of the encounter time.    Lillard Anes, NP 06/08/23 3:26 PM Medical Oncology and Hematology [facility] [address] [place], [place] [address] Tel. [phone]    Fax. [phone]  *Total Encounter Time as defined by the Centers for Medicare and Medicaid Services includes, in addition to the face-to-face time of a patient visit (documented in the note above) non-face-to-face time: obtaining and reviewing outside history, ordering and reviewing medications, tests or procedures, care coordination (communications with other health care professionals or caregivers) and documentation in the medical record.

## 2023-06-08 NOTE — Assessment & Plan Note (Signed)
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  Neoadjuvant chemotherapy with dose dense Adriamycin and Cytoxan followed by Taxol weekly x12 completed 01/17/2021 2. 02/26/2021 breast conserving surgery with sentinel lymph node and targeted node dissection,: Path CR 0/4 LN Neg 04/18/21: Bilateral Mammoplasty: Benign 3.  Adjuvant radiation therapy 05/28/21- 07/10/21 4.  Followed by adjuvant antiestrogen therapy with tamoxifen--unable to tolerate, on Letrozole daily switched to exemestane discontinued for intolerance -------------------------------------------------------------------------------------------------------------------------  PET CT 10/01/21: No evidence of metastatic disease postradiation changes in the lungs Based on severe intolerance antiestrogen therapy we decided not to pursue any further antiestrogen treatments. 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 Open heart surgery scheduled for December 6th. -Continue current management plan as directed by cardiologist.  Breast Cancer Survivor No current concerns. Last Signatera test was negative. -Continue with current follow-up plan. -Schedule mammogram for March 2025 and breast MRI for  September 2025. -Recommended use of estrace cream for vaginal dryness and atrophy.  Weight Loss Significant weight loss achieved, improving overall health. -Continue current weight management strategies.  Follow-up Plans -Follow-up appointment in six months (May 2025).

## 2023-06-09 ENCOUNTER — Telehealth: Payer: Self-pay | Admitting: Hematology and Oncology

## 2023-06-09 DIAGNOSIS — H8113 Benign paroxysmal vertigo, bilateral: Secondary | ICD-10-CM | POA: Diagnosis not present

## 2023-06-09 DIAGNOSIS — R278 Other lack of coordination: Secondary | ICD-10-CM | POA: Diagnosis not present

## 2023-06-09 DIAGNOSIS — R519 Headache, unspecified: Secondary | ICD-10-CM | POA: Diagnosis not present

## 2023-06-09 DIAGNOSIS — M47892 Other spondylosis, cervical region: Secondary | ICD-10-CM | POA: Diagnosis not present

## 2023-06-09 NOTE — Telephone Encounter (Signed)
Spoke with patient confirming upcoming appointment  

## 2023-06-10 ENCOUNTER — Encounter: Payer: BC Managed Care – PPO | Admitting: Thoracic Surgery (Cardiothoracic Vascular Surgery)

## 2023-06-30 ENCOUNTER — Telehealth: Payer: Self-pay

## 2023-06-30 NOTE — Telephone Encounter (Signed)
Completed [employer]MLA form and faxed to [phone]. Beginning LOA 07/09/23 through 10/04/23,/ DOS 07/09/23.

## 2023-07-06 NOTE — Pre-Procedure Instructions (Signed)
Surgical Instructions   Your procedure is scheduled on July 09, 2023. Report to [facility] Main Entrance "A" at 5:30 A.M., then check in with the Admitting office. Any questions or running late day of surgery: call [phone]  Questions prior to your surgery date: call [phone], Monday-Friday, 8am-4pm. If you experience any cold or flu symptoms such as cough, fever, chills, shortness of breath, etc. between now and your scheduled surgery, please notify us at the above number.     Remember:  Do not eat or drink after midnight the night before your surgery    Take these medicines the morning of surgery with A SIP OF WATER: NONE   May take these medicines IF NEEDED: clonazePAM (KLONOPIN)  fexofenadine (ALLEGRA)    STOP taking tirzepatide (ZEPBOUND) injection one week prior to surgery. DO NOT take any doses after November 28th.   One week prior to surgery, STOP taking any Aspirin (unless otherwise instructed by your surgeon) Aleve, Naproxen, Ibuprofen, Motrin, Advil, Goody's, BC's, all herbal medications, fish oil, and non-prescription vitamins.                     Do NOT Smoke (Tobacco/Vaping) for 24 hours prior to your procedure.  If you use a CPAP at night, you may bring your mask/headgear for your overnight stay.   You will be asked to remove any contacts, glasses, piercing's, hearing aid's, dentures/partials prior to surgery. Please bring cases for these items if needed.    Patients discharged the day of surgery will not be allowed to drive home, and someone needs to stay with them for 24 hours.  SURGICAL WAITING ROOM VISITATION Patients may have no more than 2 support people in the waiting area - these visitors may rotate.   Pre-op nurse will coordinate an appropriate time for 1 ADULT support person, who may not rotate, to accompany patient in pre-op.  Children under the age of 59 must have an adult with them who is not the patient and must remain in the main  waiting area with an adult.  If the patient needs to stay at the hospital during part of their recovery, the visitor guidelines for inpatient rooms apply.  Please refer to the [facility] website for the visitor guidelines for any additional information.   If you received a COVID test during your pre-op visit  it is requested that you wear a mask when out in public, stay away from anyone that may not be feeling well and notify your surgeon if you develop symptoms. If you have been in contact with anyone that has tested positive in the last 10 days please notify you surgeon.      Pre-operative CHG Bathing Instructions   You can play a key role in reducing the risk of infection after surgery. Your skin needs to be as free of germs as possible. You can reduce the number of germs on your skin by washing with CHG (chlorhexidine gluconate) soap before surgery. CHG is an antiseptic soap that kills germs and continues to kill germs even after washing.   DO NOT use if you have an allergy to chlorhexidine/CHG or antibacterial soaps. If your skin becomes reddened or irritated, stop using the CHG and notify one of our RNs at [phone].              TAKE A SHOWER THE NIGHT BEFORE SURGERY AND THE DAY OF SURGERY    Please keep in mind the following:  DO NOT  shave, including legs and underarms, 48 hours prior to surgery.   You may shave your face before/day of surgery.  Place clean sheets on your bed the night before surgery Use a clean washcloth (not used since being washed) for each shower. DO NOT sleep with pet's night before surgery.  CHG Shower Instructions:  Wash your face and private area with normal soap. If you choose to wash your hair, wash first with your normal shampoo.  After you use shampoo/soap, rinse your hair and body thoroughly to remove shampoo/soap residue.  Turn the water OFF and apply half the bottle of CHG soap to a CLEAN washcloth.  Apply CHG soap ONLY FROM YOUR NECK DOWN TO  YOUR TOES (washing for 3-5 minutes)  DO NOT use CHG soap on face, private areas, open wounds, or sores.  Pay special attention to the area where your surgery is being performed.  If you are having back surgery, having someone wash your back for you may be helpful. Wait 2 minutes after CHG soap is applied, then you may rinse off the CHG soap.  Pat dry with a clean towel  Put on clean pajamas    Additional instructions for the day of surgery: DO NOT APPLY any lotions, deodorants, cologne, or perfumes.   Do not wear jewelry or makeup Do not wear nail polish, gel polish, artificial nails, or any other type of covering on natural nails (fingers and toes) Do not bring valuables to the hospital. [facility] is not responsible for valuables/personal belongings. Put on clean/comfortable clothes.  Please brush your teeth.  Ask your nurse before applying any prescription medications to the skin.

## 2023-07-07 ENCOUNTER — Inpatient Hospital Stay (HOSPITAL_COMMUNITY)
Admission: RE | Admit: 2023-07-07 | Discharge: 2023-07-07 | Disposition: A | Discharge status: Home / Self Care | Payer: BC Managed Care – PPO | Source: Ambulatory Visit | Attending: Thoracic Surgery (Cardiothoracic Vascular Surgery)

## 2023-07-07 ENCOUNTER — Encounter (HOSPITAL_COMMUNITY): Payer: Self-pay

## 2023-07-07 ENCOUNTER — Other Ambulatory Visit: Payer: Self-pay

## 2023-07-07 ENCOUNTER — Ambulatory Visit (HOSPITAL_COMMUNITY)
Admission: RE | Admit: 2023-07-07 | Discharge: 2023-07-07 | Disposition: A | Discharge status: Home / Self Care | Payer: BC Managed Care – PPO | Source: Ambulatory Visit | Attending: Thoracic Surgery (Cardiothoracic Vascular Surgery)

## 2023-07-07 ENCOUNTER — Ambulatory Visit (HOSPITAL_BASED_OUTPATIENT_CLINIC_OR_DEPARTMENT_OTHER)
Admission: RE | Admit: 2023-07-07 | Discharge: 2023-07-07 | Disposition: A | Discharge status: Home / Self Care | Payer: BC Managed Care – PPO | Source: Ambulatory Visit | Attending: Thoracic Surgery (Cardiothoracic Vascular Surgery) | Admitting: Thoracic Surgery (Cardiothoracic Vascular Surgery)

## 2023-07-07 VITALS — BP 111/70 | HR 57 | Temp 98.8°F | Resp 17 | Ht 66.0 in | Wt 148.4 lb

## 2023-07-07 DIAGNOSIS — Z01818 Encounter for other preprocedural examination: Secondary | ICD-10-CM

## 2023-07-07 DIAGNOSIS — R0989 Other specified symptoms and signs involving the circulatory and respiratory systems: Secondary | ICD-10-CM | POA: Diagnosis not present

## 2023-07-07 DIAGNOSIS — I959 Hypotension, unspecified: Secondary | ICD-10-CM | POA: Diagnosis not present

## 2023-07-07 DIAGNOSIS — F32A Depression, unspecified: Secondary | ICD-10-CM | POA: Diagnosis not present

## 2023-07-07 DIAGNOSIS — Z882 Allergy status to sulfonamides status: Secondary | ICD-10-CM | POA: Diagnosis not present

## 2023-07-07 DIAGNOSIS — F419 Anxiety disorder, unspecified: Secondary | ICD-10-CM | POA: Diagnosis not present

## 2023-07-07 DIAGNOSIS — K219 Gastro-esophageal reflux disease without esophagitis: Secondary | ICD-10-CM | POA: Diagnosis not present

## 2023-07-07 DIAGNOSIS — Z923 Personal history of irradiation: Secondary | ICD-10-CM | POA: Diagnosis not present

## 2023-07-07 DIAGNOSIS — I340 Nonrheumatic mitral (valve) insufficiency: Secondary | ICD-10-CM | POA: Insufficient documentation

## 2023-07-07 DIAGNOSIS — Z803 Family history of malignant neoplasm of breast: Secondary | ICD-10-CM | POA: Diagnosis not present

## 2023-07-07 DIAGNOSIS — Z853 Personal history of malignant neoplasm of breast: Secondary | ICD-10-CM | POA: Diagnosis not present

## 2023-07-07 DIAGNOSIS — D62 Acute posthemorrhagic anemia: Secondary | ICD-10-CM | POA: Diagnosis not present

## 2023-07-07 DIAGNOSIS — D6851 Activated protein C resistance: Secondary | ICD-10-CM | POA: Diagnosis not present

## 2023-07-07 DIAGNOSIS — I341 Nonrheumatic mitral (valve) prolapse: Secondary | ICD-10-CM | POA: Diagnosis not present

## 2023-07-07 DIAGNOSIS — Z4682 Encounter for fitting and adjustment of non-vascular catheter: Secondary | ICD-10-CM | POA: Diagnosis not present

## 2023-07-07 DIAGNOSIS — J9811 Atelectasis: Secondary | ICD-10-CM | POA: Diagnosis not present

## 2023-07-07 DIAGNOSIS — Z79899 Other long term (current) drug therapy: Secondary | ICD-10-CM | POA: Diagnosis not present

## 2023-07-07 DIAGNOSIS — Z9049 Acquired absence of other specified parts of digestive tract: Secondary | ICD-10-CM | POA: Diagnosis not present

## 2023-07-07 DIAGNOSIS — Z9889 Other specified postprocedural states: Secondary | ICD-10-CM | POA: Diagnosis not present

## 2023-07-07 DIAGNOSIS — Z9221 Personal history of antineoplastic chemotherapy: Secondary | ICD-10-CM | POA: Diagnosis not present

## 2023-07-07 DIAGNOSIS — Z801 Family history of malignant neoplasm of trachea, bronchus and lung: Secondary | ICD-10-CM | POA: Diagnosis not present

## 2023-07-07 DIAGNOSIS — Z8041 Family history of malignant neoplasm of ovary: Secondary | ICD-10-CM | POA: Diagnosis not present

## 2023-07-07 DIAGNOSIS — Z888 Allergy status to other drugs, medicaments and biological substances status: Secondary | ICD-10-CM | POA: Diagnosis not present

## 2023-07-07 DIAGNOSIS — Z452 Encounter for adjustment and management of vascular access device: Secondary | ICD-10-CM | POA: Diagnosis not present

## 2023-07-07 DIAGNOSIS — J90 Pleural effusion, not elsewhere classified: Secondary | ICD-10-CM | POA: Diagnosis not present

## 2023-07-07 HISTORY — DX: Disease of blood and blood-forming organs, unspecified: D75.9

## 2023-07-07 HISTORY — DX: Cardiac murmur, unspecified: R01.1

## 2023-07-07 HISTORY — DX: Pneumonia, unspecified organism: J18.9

## 2023-07-07 HISTORY — DX: Unspecified osteoarthritis, unspecified site: M19.90

## 2023-07-07 LAB — URINALYSIS, ROUTINE W REFLEX MICROSCOPIC
Bilirubin Urine: NEGATIVE
Glucose, UA: NEGATIVE mg/dL
Hgb urine dipstick: NEGATIVE
Ketones, ur: NEGATIVE mg/dL
Nitrite: NEGATIVE
Protein, ur: NEGATIVE mg/dL
Specific Gravity, Urine: 1.004 — ABNORMAL LOW (ref 1.005–1.030)
pH: 6 (ref 5.0–8.0)

## 2023-07-07 LAB — COMPREHENSIVE METABOLIC PANEL
ALT: 34 U/L (ref 0–44)
AST: 33 U/L (ref 15–41)
Albumin: 3.9 g/dL (ref 3.5–5.0)
Alkaline Phosphatase: 67 U/L (ref 38–126)
Anion gap: 8 (ref 5–15)
BUN: 10 mg/dL (ref 6–20)
CO2: 24 mmol/L (ref 22–32)
Calcium: 9.2 mg/dL (ref 8.9–10.3)
Chloride: 103 mmol/L (ref 98–111)
Creatinine, Ser: 0.8 mg/dL (ref 0.44–1.00)
GFR, Estimated: >60 mL/min (ref >60)
Glucose, Bld: 87 mg/dL (ref 70–99)
Potassium: 4 mmol/L (ref 3.5–5.1)
Sodium: 135 mmol/L (ref 135–145)
Total Bilirubin: 0.8 mg/dL (ref <1.2)
Total Protein: 6.5 g/dL (ref 6.5–8.1)

## 2023-07-07 LAB — SURGICAL PCR SCREEN
MRSA, PCR: NEGATIVE
Staphylococcus aureus: NEGATIVE

## 2023-07-07 LAB — TYPE AND SCREEN
ABO/RH(D): O NEG
Antibody Screen: NEGATIVE

## 2023-07-07 LAB — CBC
HCT: 41.6 % (ref 36.0–46.0)
Hemoglobin: 14.3 g/dL (ref 12.0–15.0)
MCH: 31.2 pg (ref 26.0–34.0)
MCHC: 34.4 g/dL (ref 30.0–36.0)
MCV: 90.6 fL (ref 80.0–100.0)
Platelets: 188 10*3/uL (ref 150–400)
RBC: 4.59 MIL/uL (ref 3.87–5.11)
RDW: 13.2 % (ref 11.5–15.5)
WBC: 4.8 10*3/uL (ref 4.0–10.5)
nRBC: 0 % (ref 0.0–0.2)

## 2023-07-07 LAB — HEMOGLOBIN A1C
Hgb A1c MFr Bld: 4.7 % — ABNORMAL LOW (ref 4.8–5.6)
Mean Plasma Glucose: 88.19 mg/dL

## 2023-07-07 LAB — APTT: aPTT: 30 s (ref 24–36)

## 2023-07-07 LAB — PROTIME-INR
INR: 1 (ref 0.8–1.2)
Prothrombin Time: 13 s (ref 11.4–15.2)

## 2023-07-07 NOTE — Progress Notes (Signed)
Pt tested positive for COVID 05/22/2023. Darius Bump, RN with [facility] notified that pt will not be retested prior to surgery.

## 2023-07-07 NOTE — Progress Notes (Signed)
PCP - Dr. Aleatha Borer Cardiologist - Tommi Rumps. Carolan Clines - Last office visit 05/07/2023  PPM/ICD - Denies Device Orders - n/a Rep Notified - n/a  Chest x-ray - 07/07/2023 EKG - 07/07/2023 Stress Test - Denies ECHO - 05/14/2023 Cardiac Cath - 05/28/2023  Sleep Study - Denies CPAP - n/a  No DM  Last dose of GLP1 agonist- Last dose of Zepbound 06/30/23 GLP1 instructions: Pt instructed to continue holding medication until after surgery  Blood Thinner Instructions: n/a Aspirin Instructions: n/a  NPO after midnight  COVID TEST- n/a Pt tested positive for COVID 05/22/2023. Pt falls within 3 month timeframe to not retest.    Anesthesia review: No  Patient denies shortness of breath, fever, cough and chest pain at PAT appointment. Pt denies any respiratory illness/infection in the last two months.    All instructions explained to the patient, with a verbal understanding of the material. Patient agrees to go over the instructions while at home for a better understanding. Patient also instructed to self quarantine after being tested for COVID-19. The opportunity to ask questions was provided.

## 2023-07-08 MED ORDER — POTASSIUM CHLORIDE 2 MEQ/ML IV SOLN
80.0000 meq | INTRAVENOUS | Status: DC
  Filled 2023-07-08: qty 40

## 2023-07-08 MED ORDER — NITROGLYCERIN IN D5W 200-5 MCG/ML-% IV SOLN
2.0000 ug/min | INTRAVENOUS | Status: DC
  Filled 2023-07-08: qty 250

## 2023-07-08 MED ORDER — CEFAZOLIN SODIUM-DEXTROSE 2-4 GM/100ML-% IV SOLN
2.0000 g | INTRAVENOUS | Status: DC
  Filled 2023-07-08: qty 100

## 2023-07-08 MED ORDER — NOREPINEPHRINE 4 MG/250ML-% IV SOLN
0.0000 ug/min | INTRAVENOUS | Status: AC
  Administered 2023-07-09: 1 ug/min via INTRAVENOUS
  Filled 2023-07-08: qty 250

## 2023-07-08 MED ORDER — TRANEXAMIC ACID (OHS) PUMP PRIME SOLUTION
2.0000 mg/kg | INTRAVENOUS | Status: DC
  Filled 2023-07-08: qty 1.35

## 2023-07-08 MED ORDER — TRANEXAMIC ACID 1000 MG/10ML IV SOLN
1.5000 mg/kg/h | INTRAVENOUS | Status: AC
  Administered 2023-07-09: 1.5 mg/kg/h via INTRAVENOUS
  Filled 2023-07-08: qty 25

## 2023-07-08 MED ORDER — TRANEXAMIC ACID (OHS) BOLUS VIA INFUSION
15.0000 mg/kg | INTRAVENOUS | Status: AC
  Administered 2023-07-09: 1009.5 mg via INTRAVENOUS
  Filled 2023-07-08: qty 1010

## 2023-07-08 MED ORDER — INSULIN REGULAR(HUMAN) IN NACL 100-0.9 UT/100ML-% IV SOLN
INTRAVENOUS | Status: AC
  Administered 2023-07-09: 1.5 [IU]/h via INTRAVENOUS
  Filled 2023-07-08: qty 100

## 2023-07-08 MED ORDER — HEPARIN 30,000 UNITS/1000 ML (OHS) CELLSAVER SOLUTION
Status: DC
  Filled 2023-07-08: qty 1000

## 2023-07-08 MED ORDER — PHENYLEPHRINE HCL-NACL 20-0.9 MG/250ML-% IV SOLN
30.0000 ug/min | INTRAVENOUS | Status: DC
  Filled 2023-07-08: qty 250

## 2023-07-08 MED ORDER — PLASMA-LYTE A IV SOLN
INTRAVENOUS | Status: DC
  Filled 2023-07-08: qty 2.5

## 2023-07-08 MED ORDER — MANNITOL 20 % IV SOLN
INTRAVENOUS | Status: DC
  Filled 2023-07-08: qty 13

## 2023-07-08 MED ORDER — CEFAZOLIN SODIUM-DEXTROSE 2-4 GM/100ML-% IV SOLN
2.0000 g | INTRAVENOUS | Status: AC
  Administered 2023-07-09 (×2): 2 g via INTRAVENOUS
  Filled 2023-07-08: qty 100

## 2023-07-08 MED ORDER — VANCOMYCIN HCL 1250 MG/250ML IV SOLN
1250.0000 mg | INTRAVENOUS | Status: AC
  Administered 2023-07-09: 1250 mg via INTRAVENOUS
  Filled 2023-07-08: qty 250

## 2023-07-08 MED ORDER — MILRINONE LACTATE IN DEXTROSE 20-5 MG/100ML-% IV SOLN
0.3000 ug/kg/min | INTRAVENOUS | Status: DC
  Filled 2023-07-08: qty 100

## 2023-07-08 MED ORDER — VANCOMYCIN HCL 1000 MG IV SOLR
INTRAVENOUS | Status: DC
  Filled 2023-07-08: qty 20

## 2023-07-08 MED ORDER — DEXMEDETOMIDINE HCL IN NACL 400 MCG/100ML IV SOLN
0.1000 ug/kg/h | INTRAVENOUS | Status: AC
  Administered 2023-07-09: .4 ug/kg/h via INTRAVENOUS
  Filled 2023-07-08: qty 100

## 2023-07-08 MED ORDER — EPINEPHRINE HCL 5 MG/250ML IV SOLN IN NS
0.0000 ug/min | INTRAVENOUS | Status: DC
  Filled 2023-07-08: qty 250

## 2023-07-08 NOTE — H&P (Signed)
[address]       [place] [address]             [id number]                                   Jahkayla Barrero [facility] Medical Record #[id number] Date of Birth: [birth year]   Maisie Fus, MD Jettie Pagan, NP   Chief Complaint:   MV regurgitation   History of Present Illness:     Pt is a 52 yo female who reportedly was told had mitral valve issues and regurgitation for years. Pt had done well until recently when in [place] had an event where she "passed out' and was found to have what she thought was a low heart rate and pressure. She also feels some "electrical" pulse on occasion in her chest up towards the neck area and a "buzziness". She has no appreciable DOE or fatigue. She has had COVID three times and feels has had lower heart rates and BP since. She has had breast cancer sp lumpectomy, aggressive chemo and RT therapy. She also has a hypercoagulable state inherited where she need to be on heparin shots during her pregnancy. 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 She has had a R and L cath without PHTN nor CAD. Pt presents for surgical consideration             Past Medical History:  Diagnosis Date   Anxiety     Cancer (HCC)      breast   Depression     Family history of breast cancer     Family history of lung cancer     Family history of ovarian cancer     GERD (gastroesophageal reflux disease)      diet controlled   History of radiation therapy      Left breast, left axilla- 05/27/21-07/10/21- Dr. Antony Blackbird   Seasonal allergies                 Past Surgical History:  Procedure Laterality Date   BREAST BIOPSY Left 2019    x 2 biopsy   BREAST EXCISIONAL BIOPSY Left     BREAST LUMPECTOMY WITH RADIOACTIVE SEED AND SENTINEL LYMPH NODE BIOPSY Left 02/26/2021    Procedure: LEFT BREAST LUMPECTOMY WITH RADIOACTIVE SEED x2; LEFT AXILLARY SENTINEL NODE BIOPSY;  Surgeon:  Emelia Loron, MD;  Location: [facility];  Service: General;  Laterality: Left;   BREAST LUMPECTOMY WITH RADIOFREQUENCY TAG IDENTIFICATION Left 04/18/2021    Procedure: LEFT BREAST MRI WIRE GUIDED EXCISION;  Surgeon: Emelia Loron, MD;  Location: [facility];  Service: General;  Laterality: Left;   BREAST REDUCTION SURGERY Bilateral 04/18/2021    Procedure: MAMMARY REDUCTION  (BREAST) LEFT ONCOPLASTIC BREAST RECONSTRUCTION, RIGHT BREAST REDUCTION;  Surgeon: Glenna Fellows, MD;  Location: [facility];  Service: Plastics;  Laterality: Bilateral;   CHOLECYSTECTOMY   2008   PORT-A-CATH REMOVAL Right 04/18/2021    Procedure: REMOVAL PORT-A-CATH;  Surgeon: Emelia Loron, MD;  Location: [facility];  Service: General;  Laterality: Right;   PORTACATH PLACEMENT N/A 09/05/2020    Procedure: INSERTION PORT-A-CATH;  Surgeon: Emelia Loron, MD;  Location: [facility] OR;  Service: General;  Laterality: N/A;  PEC BLOCK   TEE WITHOUT CARDIOVERSION N/A 05/14/2023  Procedure: TRANSESOPHAGEAL ECHOCARDIOGRAM;  Surgeon: Maisie Fus, MD;  Location: [facility] INVASIVE CV LAB;  Service: Cardiovascular;  Laterality: N/A;   WISDOM TOOTH EXTRACTION              Tobacco Use History  Social History       Tobacco Use  Smoking Status Never  Smokeless Tobacco Never      Social History       Substance and Sexual Activity  Alcohol Use Not Currently      Social History         Socioeconomic History   Marital status: Married      Spouse name: Not on file   Number of children: Not on file   Years of education: Not on file   Highest education level: Not on file  Occupational History   Not on file  Tobacco Use   Smoking status: Never   Smokeless tobacco: Never  Vaping Use   Vaping status: Never Used  Substance and Sexual Activity   Alcohol use: Not Currently   Drug use: Never   Sexual activity: Yes      Birth control/protection: None   Other Topics Concern   Not on file  Social History Narrative   Not on file    Social Determinants of Health        Financial Resource Strain: Patient Declined (08/14/2022)    Received from [facility]    Overall Financial Resource Strain (CARDIA)     Difficulty of Paying Living Expenses: Patient declined  Food Insecurity: Patient Declined (04/18/2023)    Received from [facility]    Hunger Vital Sign     Worried About Running Out of Food in the Last Year: Patient declined     Ran Out of Food in the Last Year: Patient declined  Transportation Needs: Patient Declined (04/18/2023)    Received from [facility] - Transportation     Lack of Transportation (Medical): Patient declined     Lack of Transportation (Non-Medical): Patient declined  Physical Activity: Unknown (04/18/2023)    [facility]    Exercise Vital Sign     Days of Exercise per Week: Patient declined     Minutes of Exercise per Session: 80 min  Stress: Patient Declined (04/18/2023)    Received from [facility] - Occupational Stress Questionnaire     Feeling of Stress : Patient declined  Social Connections: Moderately Integrated (04/18/2023)    Received from [facility]    Social Network     How would you rate your social network (family, work, friends)?: Adequate participation with social networks  Intimate Partner Violence: Not At Risk (04/18/2023)    Received from [facility]    HITS     Over the last 12 months how often did your partner physically hurt you?: 1     Over the last 12 months how often did your partner insult you or talk down to you?: 1     Over the last 12 months how often did your partner threaten you with physical harm?: 1     Over the last 12 months how often did your partner scream or curse at you?: 1      Allergies       Allergies  Allergen Reactions   Sulfa Antibiotics        Rash, urticarial    Propoxyphene Anxiety, Rash and Other (  See Comments)   Sulfamethoxazole Rash      Feel sun burn              Current Outpatient Medications  Medication Sig Dispense Refill   B Complex-C-Iron TABS Take 1 tablet by mouth daily.       clonazePAM (KLONOPIN) 1 MG tablet Take 1 tablet (1 mg total) by mouth 2 (two) times daily as needed. (Patient taking differently: Take 1 mg by mouth 2 (two) times daily.) 60 tablet 0   clonazePAM (KLONOPIN) 1 MG tablet Take 1 tablet (1 mg total) by mouth 2 (two) times daily as needed for anxiety. 60 tablet 0   DHA-EPA-Coenzyme Q10-Vitamin E (COQ-10 & FISH OIL PO) Take 3 capsules by mouth daily.       estradiol (ESTRACE VAGINAL) 0.1 MG/GM vaginal cream Place 1 Applicatorful vaginally 3 (three) times a week. 42.5 g 12   fluticasone (FLONASE) 50 MCG/ACT nasal spray Place 1 spray into both nostrils daily as needed for allergies.       MAGNESIUM PO Take 1 tablet by mouth daily.       naproxen sodium (ALEVE) 220 MG tablet Take 220 mg by mouth daily as needed.       Nutritional Supplements (NUTRITIONAL SUPPLEMENT PO) Take 1 Scoop by mouth daily.       OVER THE COUNTER MEDICATION Take 4 capsules by mouth daily. "Nutrafol": Vitamin A, Vitamin C, Vitamin D, Vitamin E, Biotin, Calcium, Iodine, Zinc, Selenium, Potassium, Maca Powder, Saw Palmetto extract, ashwaganda, hydrolized marine collagen, turmeric extract, palm extract, haematococcspluvlalis (algae), l-lysine, l-methionine, l-cystine, horse tail extract, japanese knotweed, black pepper extract, cayenne extract       sertraline (ZOLOFT) 100 MG tablet Take one tablet (100 mg dose) by mouth at bedtime. 90 tablet 1   tirzepatide (ZEPBOUND) 15 MG/0.5ML Pen Inject 15 mg into the skin once a week. 6 mL 0   tirzepatide (ZEPBOUND) 15 MG/0.5ML Pen Inject 15 mg into the skin every 7 (seven) days. 6 mL 0   UNABLE TO FIND Take 2 capsules by mouth daily. Omega 7 (newchapter brand)       Vitamin D-Vitamin K (VITAMIN D2 + K1 PO) Take 2  drops by mouth daily. Liquid          No current facility-administered medications for this visit.               Family History  Problem Relation Age of Onset   Eczema Father     Allergic rhinitis Brother     Breast cancer Mother          in 56's   Cancer Maternal Uncle          unk type   Lung cancer Maternal Grandfather 42   Heart Problems Paternal Grandfather     Melanoma Cousin     Ovarian cancer Paternal Great-grandmother     Cancer Paternal Great-grandmother          GYN cancer, possibly ovarian?   Cancer Maternal Aunt          half aunts/uncles x5- rare cancers                Physical Exam: LMP 09/05/2020 (Exact Date)  Teeth in good repair Lungs: Clear Card: RR with somewhat later appearing 3/6 sem Ext: no edema Neuro: intact       Diagnostic Studies & Laboratory data: I have personally reviewed the following studies and agree with the findings   TEE (05/2023) IMPRESSIONS  1. Left ventricular ejection fraction, by estimation, is 60 to 65%. The  left ventricle has normal function.   2. Right ventricular systolic function is normal. The right ventricular  size is normal.   3. No left atrial/left atrial appendage thrombus was detected.   4. There are multiple jets, EROA may be underestimating the severity of  regurgitation. 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   5. The aortic valve is normal in structure. Aortic valve regurgitation is  not visualized.   Conclusion(s)/Recommendation(s): Referral sent to Dr. Leafy Ro.   FINDINGS   Left Ventricle: Left ventricular ejection fraction, by estimation, is 60  to 65%. The left ventricle has normal function. The left ventricular  internal cavity size was normal in size.   Right Ventricle: The right ventricular size is normal. Right ventricular  systolic function is normal.   Left Atrium: No left  atrial/left atrial appendage thrombus was detected.   Mitral Valve: There are multiple jets, EROA may be underestimating the  severity of regurgitation. 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   Tricuspid Valve: The tricuspid valve is normal in structure. Tricuspid  valve regurgitation is trivial.   Aortic Valve: The aortic valve is normal in structure. Aortic valve  regurgitation is not visualized.   Pulmonic Valve: The pulmonic valve was normal in structure. Pulmonic valve  regurgitation is not visualized.   Aorta: The aortic root and ascending aorta are structurally normal, with  no evidence of dilitation. There is minimal (Grade I) plaque.   IAS/Shunts: No atrial level shunt detected by color flow Doppler.     MR Peak grad:    106.5 mmHg  MR Mean grad:    81.0 mmHg  MR Vmax:         516.00 cm/s  MR Vmean:        435.0 cm/s  MR PISA:         3.08 cm  MR PISA Eff ROA: 23 mm  MR PISA Radius:  0.70 cm    Cath (05/2023) Conclusion   1.  Normal right dominant coronary circulation. 2.  Fick cardiac output of 6 L/min and Fick cardiac index of 3.4 L/min with the following hemodynamics:             Right atrial pressure mean of 9 mmHg             Right ventricular pressure 27/7 with end-diastolic pressure of 12 mmHg             Wedge pressure mean of 11 mmHg with V waves to 12 mmHg             Pulmonary artery pressure 27/11 with a mean of 18 mmHg             PVR of 1.2 Woods units             PA pulsatility index of 1.8 3.  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 He symptoms are difficult to assess to the MV however the severity of the regurgitation seems to coincide with her symptoms of lightheadedness. We discussed the pathology of her valve and the increased difficulty in repair and possible need for mechanical valve replacement. She understands the risks, goals and recovery and would like to proceed as schedule permits. Will operate on 12/6 and will obtain preoperative studies of carotid duplex. She has a ct of chest in the last two years which suffices.

## 2023-07-08 NOTE — Anesthesia Preprocedure Evaluation (Addendum)
Anesthesia Evaluation  Patient identified by MRN, date of birth, ID band Patient awake    Reviewed: Allergy & Precautions, H&P , NPO status , Patient's Chart, lab work & pertinent test results  Airway Mallampati: II  TM Distance: >3 FB Neck ROM: Full    Dental no notable dental hx. (+) Teeth Intact, Dental Advisory Given   Pulmonary neg pulmonary ROS   Pulmonary exam normal breath sounds clear to auscultation       Cardiovascular Exercise Tolerance: Good + Valvular Problems/Murmurs MR  Rhythm:Regular Rate:Normal     Neuro/Psych  Headaches  Anxiety Depression    TIACVA    GI/Hepatic Neg liver ROS,GERD  ,,  Endo/Other  negative endocrine ROS    Renal/GU negative Renal ROS  negative genitourinary   Musculoskeletal  (+) Arthritis , Osteoarthritis,    Abdominal   Peds  Hematology negative hematology ROS (+)   Anesthesia Other Findings   Reproductive/Obstetrics negative OB ROS                             Anesthesia Physical Anesthesia Plan  ASA: 4  Anesthesia Plan: General   Post-op Pain Management: Tylenol PO (pre-op)*   Induction: Intravenous  PONV Risk Score and Plan: 3 and Ondansetron, Dexamethasone and Midazolam  Airway Management Planned: Oral ETT  Additional Equipment: Arterial line, CVP, PA Cath, TEE, 3D TEE and Ultrasound Guidance Line Placement  Intra-op Plan:   Post-operative Plan: Post-operative intubation/ventilation  Informed Consent: I have reviewed the patients History and Physical, chart, labs and discussed the procedure including the risks, benefits and alternatives for the proposed anesthesia with the patient or authorized representative who has indicated his/her understanding and acceptance.     Dental advisory given  Plan Discussed with: CRNA  Anesthesia Plan Comments:        Anesthesia Quick Evaluation

## 2023-07-09 ENCOUNTER — Other Ambulatory Visit: Payer: Self-pay

## 2023-07-09 ENCOUNTER — Inpatient Hospital Stay (HOSPITAL_COMMUNITY): Payer: BC Managed Care – PPO | Admitting: Anesthesiology

## 2023-07-09 ENCOUNTER — Inpatient Hospital Stay (HOSPITAL_COMMUNITY)
Admission: RE | Admit: 2023-07-09 | Discharge: 2023-07-15 | DRG: 220 | Disposition: A | Discharge status: Home Health | Payer: BC Managed Care – PPO | Attending: Thoracic Surgery (Cardiothoracic Vascular Surgery) | Admitting: Thoracic Surgery (Cardiothoracic Vascular Surgery)

## 2023-07-09 ENCOUNTER — Inpatient Hospital Stay (HOSPITAL_COMMUNITY): Payer: BC Managed Care – PPO

## 2023-07-09 ENCOUNTER — Encounter (HOSPITAL_COMMUNITY)
Admission: RE | Disposition: A | Discharge status: Home Health | Payer: Self-pay | Source: Home / Self Care | Attending: Thoracic Surgery (Cardiothoracic Vascular Surgery)

## 2023-07-09 ENCOUNTER — Other Ambulatory Visit: Payer: Self-pay | Admitting: Cardiology

## 2023-07-09 ENCOUNTER — Encounter (HOSPITAL_COMMUNITY): Payer: Self-pay | Admitting: Thoracic Surgery (Cardiothoracic Vascular Surgery)

## 2023-07-09 DIAGNOSIS — I341 Nonrheumatic mitral (valve) prolapse: Secondary | ICD-10-CM

## 2023-07-09 DIAGNOSIS — Z8041 Family history of malignant neoplasm of ovary: Secondary | ICD-10-CM

## 2023-07-09 DIAGNOSIS — Z9049 Acquired absence of other specified parts of digestive tract: Secondary | ICD-10-CM | POA: Diagnosis not present

## 2023-07-09 DIAGNOSIS — F419 Anxiety disorder, unspecified: Secondary | ICD-10-CM | POA: Diagnosis present

## 2023-07-09 DIAGNOSIS — Z9221 Personal history of antineoplastic chemotherapy: Secondary | ICD-10-CM | POA: Diagnosis not present

## 2023-07-09 DIAGNOSIS — Z882 Allergy status to sulfonamides status: Secondary | ICD-10-CM | POA: Diagnosis not present

## 2023-07-09 DIAGNOSIS — I959 Hypotension, unspecified: Secondary | ICD-10-CM | POA: Diagnosis not present

## 2023-07-09 DIAGNOSIS — D62 Acute posthemorrhagic anemia: Secondary | ICD-10-CM | POA: Diagnosis not present

## 2023-07-09 DIAGNOSIS — Z803 Family history of malignant neoplasm of breast: Secondary | ICD-10-CM

## 2023-07-09 DIAGNOSIS — Z8673 Personal history of transient ischemic attack (TIA), and cerebral infarction without residual deficits: Secondary | ICD-10-CM | POA: Diagnosis not present

## 2023-07-09 DIAGNOSIS — J9811 Atelectasis: Secondary | ICD-10-CM | POA: Diagnosis present

## 2023-07-09 DIAGNOSIS — Z801 Family history of malignant neoplasm of trachea, bronchus and lung: Secondary | ICD-10-CM | POA: Diagnosis not present

## 2023-07-09 DIAGNOSIS — K219 Gastro-esophageal reflux disease without esophagitis: Secondary | ICD-10-CM | POA: Diagnosis present

## 2023-07-09 DIAGNOSIS — D6851 Activated protein C resistance: Secondary | ICD-10-CM | POA: Diagnosis present

## 2023-07-09 DIAGNOSIS — Z888 Allergy status to other drugs, medicaments and biological substances status: Secondary | ICD-10-CM | POA: Diagnosis not present

## 2023-07-09 DIAGNOSIS — F32A Depression, unspecified: Secondary | ICD-10-CM | POA: Diagnosis present

## 2023-07-09 DIAGNOSIS — Z9889 Other specified postprocedural states: Secondary | ICD-10-CM | POA: Diagnosis not present

## 2023-07-09 DIAGNOSIS — Z79899 Other long term (current) drug therapy: Secondary | ICD-10-CM | POA: Diagnosis not present

## 2023-07-09 DIAGNOSIS — Z923 Personal history of irradiation: Secondary | ICD-10-CM

## 2023-07-09 DIAGNOSIS — I340 Nonrheumatic mitral (valve) insufficiency: Principal | ICD-10-CM | POA: Diagnosis present

## 2023-07-09 DIAGNOSIS — Z85828 Personal history of other malignant neoplasm of skin: Secondary | ICD-10-CM

## 2023-07-09 DIAGNOSIS — Z853 Personal history of malignant neoplasm of breast: Secondary | ICD-10-CM

## 2023-07-09 DIAGNOSIS — Z7985 Long-term (current) use of injectable non-insulin antidiabetic drugs: Secondary | ICD-10-CM | POA: Diagnosis not present

## 2023-07-09 HISTORY — PX: TEE WITHOUT CARDIOVERSION: SHX5443

## 2023-07-09 HISTORY — PX: MITRAL VALVE REPAIR: SHX2039

## 2023-07-09 LAB — POCT I-STAT 7, (LYTES, BLD GAS, ICA,H+H)
Acid-base deficit: 4 mmol/L — ABNORMAL HIGH (ref 0.0–2.0)
Acid-base deficit: 1 mmol/L (ref 0.0–2.0)
Acid-base deficit: 5 mmol/L — ABNORMAL HIGH (ref 0.0–2.0)
Acid-base deficit: 6 mmol/L — ABNORMAL HIGH (ref 0.0–2.0)
Acid-base deficit: 5 mmol/L — ABNORMAL HIGH (ref 0.0–2.0)
Acid-base deficit: 1 mmol/L (ref 0.0–2.0)
Bicarbonate: 21.6 mmol/L (ref 20.0–28.0)
Bicarbonate: 23.7 mmol/L (ref 20.0–28.0)
Bicarbonate: 19.6 mmol/L — ABNORMAL LOW (ref 20.0–28.0)
Bicarbonate: 20.1 mmol/L (ref 20.0–28.0)
Bicarbonate: 20.8 mmol/L (ref 20.0–28.0)
Bicarbonate: 24.4 mmol/L (ref 20.0–28.0)
Calcium, Ion: 1.24 mmol/L (ref 1.15–1.40)
Calcium, Ion: 0.87 mmol/L — CL (ref 1.15–1.40)
Calcium, Ion: 0.97 mmol/L — ABNORMAL LOW (ref 1.15–1.40)
Calcium, Ion: 1.06 mmol/L — ABNORMAL LOW (ref 1.15–1.40)
Calcium, Ion: 1.01 mmol/L — ABNORMAL LOW (ref 1.15–1.40)
Calcium, Ion: 1.07 mmol/L — ABNORMAL LOW (ref 1.15–1.40)
HCT: 35 % — ABNORMAL LOW (ref 36.0–46.0)
HCT: 24 % — ABNORMAL LOW (ref 36.0–46.0)
HCT: 24 % — ABNORMAL LOW (ref 36.0–46.0)
HCT: 28 % — ABNORMAL LOW (ref 36.0–46.0)
HCT: 24 % — ABNORMAL LOW (ref 36.0–46.0)
HCT: 25 % — ABNORMAL LOW (ref 36.0–46.0)
Hemoglobin: 11.9 g/dL — ABNORMAL LOW (ref 12.0–15.0)
Hemoglobin: 8.2 g/dL — ABNORMAL LOW (ref 12.0–15.0)
Hemoglobin: 8.2 g/dL — ABNORMAL LOW (ref 12.0–15.0)
Hemoglobin: 9.5 g/dL — ABNORMAL LOW (ref 12.0–15.0)
Hemoglobin: 8.2 g/dL — ABNORMAL LOW (ref 12.0–15.0)
Hemoglobin: 8.5 g/dL — ABNORMAL LOW (ref 12.0–15.0)
O2 Saturation: 100 %
O2 Saturation: 100 %
O2 Saturation: 100 %
O2 Saturation: 99 %
O2 Saturation: 99 %
O2 Saturation: 99 %
Patient temperature: 36
Patient temperature: 36
Patient temperature: 36.4
Potassium: 3.9 mmol/L (ref 3.5–5.1)
Potassium: 4.2 mmol/L (ref 3.5–5.1)
Potassium: 4.2 mmol/L (ref 3.5–5.1)
Potassium: 3.8 mmol/L (ref 3.5–5.1)
Potassium: 3.9 mmol/L (ref 3.5–5.1)
Potassium: 3.9 mmol/L (ref 3.5–5.1)
Sodium: 139 mmol/L (ref 135–145)
Sodium: 141 mmol/L (ref 135–145)
Sodium: 136 mmol/L (ref 135–145)
Sodium: 140 mmol/L (ref 135–145)
Sodium: 143 mmol/L (ref 135–145)
Sodium: 141 mmol/L (ref 135–145)
TCO2: 23 mmol/L (ref 22–32)
TCO2: 25 mmol/L (ref 22–32)
TCO2: 21 mmol/L — ABNORMAL LOW (ref 22–32)
TCO2: 21 mmol/L — ABNORMAL LOW (ref 22–32)
TCO2: 22 mmol/L (ref 22–32)
TCO2: 26 mmol/L (ref 22–32)
pCO2 arterial: 40.2 mm[Hg] (ref 32–48)
pCO2 arterial: 38.1 mm[Hg] (ref 32–48)
pCO2 arterial: 32.9 mm[Hg] (ref 32–48)
pCO2 arterial: 41.5 mm[Hg] (ref 32–48)
pCO2 arterial: 39.9 mm[Hg] (ref 32–48)
pCO2 arterial: 41.8 mm[Hg] (ref 32–48)
pH, Arterial: 7.339 — ABNORMAL LOW (ref 7.35–7.45)
pH, Arterial: 7.401 (ref 7.35–7.45)
pH, Arterial: 7.384 (ref 7.35–7.45)
pH, Arterial: 7.289 — ABNORMAL LOW (ref 7.35–7.45)
pH, Arterial: 7.32 — ABNORMAL LOW (ref 7.35–7.45)
pH, Arterial: 7.371 (ref 7.35–7.45)
pO2, Arterial: 531 mm[Hg] — ABNORMAL HIGH (ref 83–108)
pO2, Arterial: 331 mm[Hg] — ABNORMAL HIGH (ref 83–108)
pO2, Arterial: 448 mm[Hg] — ABNORMAL HIGH (ref 83–108)
pO2, Arterial: 159 mm[Hg] — ABNORMAL HIGH (ref 83–108)
pO2, Arterial: 146 mm[Hg] — ABNORMAL HIGH (ref 83–108)
pO2, Arterial: 158 mm[Hg] — ABNORMAL HIGH (ref 83–108)

## 2023-07-09 LAB — HEMOGLOBIN AND HEMATOCRIT, BLOOD
HCT: 24.1 % — ABNORMAL LOW (ref 36.0–46.0)
Hemoglobin: 8.4 g/dL — ABNORMAL LOW (ref 12.0–15.0)

## 2023-07-09 LAB — CBC
HCT: 29 % — ABNORMAL LOW (ref 36.0–46.0)
HCT: 28.5 % — ABNORMAL LOW (ref 36.0–46.0)
Hemoglobin: 10 g/dL — ABNORMAL LOW (ref 12.0–15.0)
Hemoglobin: 10 g/dL — ABNORMAL LOW (ref 12.0–15.0)
MCH: 31.3 pg (ref 26.0–34.0)
MCH: 31.6 pg (ref 26.0–34.0)
MCHC: 34.5 g/dL (ref 30.0–36.0)
MCHC: 35.1 g/dL (ref 30.0–36.0)
MCV: 90.6 fL (ref 80.0–100.0)
MCV: 90.2 fL (ref 80.0–100.0)
Platelets: 111 10*3/uL — ABNORMAL LOW (ref 150–400)
Platelets: 129 10*3/uL — ABNORMAL LOW (ref 150–400)
RBC: 3.2 MIL/uL — ABNORMAL LOW (ref 3.87–5.11)
RBC: 3.16 MIL/uL — ABNORMAL LOW (ref 3.87–5.11)
RDW: 13.1 % (ref 11.5–15.5)
RDW: 13 % (ref 11.5–15.5)
WBC: 10.7 10*3/uL — ABNORMAL HIGH (ref 4.0–10.5)
WBC: 8.6 10*3/uL (ref 4.0–10.5)
nRBC: 0 % (ref 0.0–0.2)
nRBC: 0 % (ref 0.0–0.2)

## 2023-07-09 LAB — POCT I-STAT, CHEM 8
BUN: 11 mg/dL (ref 6–20)
BUN: 8 mg/dL (ref 6–20)
BUN: 9 mg/dL (ref 6–20)
BUN: 9 mg/dL (ref 6–20)
BUN: 9 mg/dL (ref 6–20)
Calcium, Ion: 1.23 mmol/L (ref 1.15–1.40)
Calcium, Ion: 0.92 mmol/L — ABNORMAL LOW (ref 1.15–1.40)
Calcium, Ion: 1.2 mmol/L (ref 1.15–1.40)
Calcium, Ion: 0.97 mmol/L — ABNORMAL LOW (ref 1.15–1.40)
Calcium, Ion: 0.97 mmol/L — ABNORMAL LOW (ref 1.15–1.40)
Chloride: 104 mmol/L (ref 98–111)
Chloride: 100 mmol/L (ref 98–111)
Chloride: 107 mmol/L (ref 98–111)
Chloride: 103 mmol/L (ref 98–111)
Chloride: 104 mmol/L (ref 98–111)
Creatinine, Ser: 0.7 mg/dL (ref 0.44–1.00)
Creatinine, Ser: 0.5 mg/dL (ref 0.44–1.00)
Creatinine, Ser: 0.6 mg/dL (ref 0.44–1.00)
Creatinine, Ser: 0.5 mg/dL (ref 0.44–1.00)
Creatinine, Ser: 0.5 mg/dL (ref 0.44–1.00)
Glucose, Bld: 117 mg/dL — ABNORMAL HIGH (ref 70–99)
Glucose, Bld: 87 mg/dL (ref 70–99)
Glucose, Bld: 94 mg/dL (ref 70–99)
Glucose, Bld: 149 mg/dL — ABNORMAL HIGH (ref 70–99)
Glucose, Bld: 199 mg/dL — ABNORMAL HIGH (ref 70–99)
HCT: 34 % — ABNORMAL LOW (ref 36.0–46.0)
HCT: 24 % — ABNORMAL LOW (ref 36.0–46.0)
HCT: 30 % — ABNORMAL LOW (ref 36.0–46.0)
HCT: 22 % — ABNORMAL LOW (ref 36.0–46.0)
HCT: 24 % — ABNORMAL LOW (ref 36.0–46.0)
Hemoglobin: 11.6 g/dL — ABNORMAL LOW (ref 12.0–15.0)
Hemoglobin: 10.2 g/dL — ABNORMAL LOW (ref 12.0–15.0)
Hemoglobin: 8.2 g/dL — ABNORMAL LOW (ref 12.0–15.0)
Hemoglobin: 7.5 g/dL — ABNORMAL LOW (ref 12.0–15.0)
Hemoglobin: 8.2 g/dL — ABNORMAL LOW (ref 12.0–15.0)
Potassium: 3.9 mmol/L (ref 3.5–5.1)
Potassium: 4 mmol/L (ref 3.5–5.1)
Potassium: 4.3 mmol/L (ref 3.5–5.1)
Potassium: 4.2 mmol/L (ref 3.5–5.1)
Potassium: 4.7 mmol/L (ref 3.5–5.1)
Sodium: 141 mmol/L (ref 135–145)
Sodium: 140 mmol/L (ref 135–145)
Sodium: 142 mmol/L (ref 135–145)
Sodium: 136 mmol/L (ref 135–145)
Sodium: 139 mmol/L (ref 135–145)
TCO2: 25 mmol/L (ref 22–32)
TCO2: 23 mmol/L (ref 22–32)
TCO2: 26 mmol/L (ref 22–32)
TCO2: 23 mmol/L (ref 22–32)
TCO2: 25 mmol/L (ref 22–32)

## 2023-07-09 LAB — BASIC METABOLIC PANEL
Anion gap: 5 (ref 5–15)
BUN: 9 mg/dL (ref 6–20)
CO2: 22 mmol/L (ref 22–32)
Calcium: 7.1 mg/dL — ABNORMAL LOW (ref 8.9–10.3)
Chloride: 110 mmol/L (ref 98–111)
Creatinine, Ser: 0.81 mg/dL (ref 0.44–1.00)
GFR, Estimated: >60 mL/min (ref >60)
Glucose, Bld: 140 mg/dL — ABNORMAL HIGH (ref 70–99)
Potassium: 3.8 mmol/L (ref 3.5–5.1)
Sodium: 137 mmol/L (ref 135–145)

## 2023-07-09 LAB — PLATELET COUNT: Platelets: 119 10*3/uL — ABNORMAL LOW (ref 150–400)

## 2023-07-09 LAB — APTT: aPTT: 35 s (ref 24–36)

## 2023-07-09 LAB — PROTIME-INR
INR: 1.4 — ABNORMAL HIGH (ref 0.8–1.2)
Prothrombin Time: 17.8 s — ABNORMAL HIGH (ref 11.4–15.2)

## 2023-07-09 LAB — GLUCOSE, CAPILLARY
Glucose-Capillary: 145 mg/dL — ABNORMAL HIGH (ref 70–99)
Glucose-Capillary: 141 mg/dL — ABNORMAL HIGH (ref 70–99)
Glucose-Capillary: 133 mg/dL — ABNORMAL HIGH (ref 70–99)
Glucose-Capillary: 125 mg/dL — ABNORMAL HIGH (ref 70–99)
Glucose-Capillary: 149 mg/dL — ABNORMAL HIGH (ref 70–99)
Glucose-Capillary: 134 mg/dL — ABNORMAL HIGH (ref 70–99)
Glucose-Capillary: 137 mg/dL — ABNORMAL HIGH (ref 70–99)
Glucose-Capillary: 140 mg/dL — ABNORMAL HIGH (ref 70–99)
Glucose-Capillary: 141 mg/dL — ABNORMAL HIGH (ref 70–99)
Glucose-Capillary: 86 mg/dL (ref 70–99)

## 2023-07-09 LAB — ECHO INTRAOPERATIVE TEE
Height: 66 in
Weight: 2320 [oz_av]

## 2023-07-09 LAB — MAGNESIUM: Magnesium: 2.9 mg/dL — ABNORMAL HIGH (ref 1.7–2.4)

## 2023-07-09 LAB — ABO/RH: ABO/RH(D): O NEG

## 2023-07-09 SURGERY — REPAIR, MITRAL VALVE
Anesthesia: General | Site: Chest

## 2023-07-09 MED ORDER — SODIUM BICARBONATE 8.4 % IV SOLN
50.0000 meq | Freq: Once | INTRAVENOUS | Status: AC
  Administered 2023-07-09: 50 meq via INTRAVENOUS

## 2023-07-09 MED ORDER — POTASSIUM CHLORIDE 10 MEQ/50ML IV SOLN
10.0000 meq | INTRAVENOUS | Status: AC
  Administered 2023-07-09 (×3): 10 meq via INTRAVENOUS

## 2023-07-09 MED ORDER — SUGAMMADEX SODIUM 200 MG/2ML IV SOLN
INTRAVENOUS | Status: DC | PRN
Start: 2023-07-09 — End: 2023-07-09
  Administered 2023-07-09: 131.6 mg via INTRAVENOUS

## 2023-07-09 MED ORDER — NOREPINEPHRINE BITARTRATE 1 MG/ML IV SOLN
INTRAVENOUS | Status: DC | PRN
  Administered 2023-07-09 (×2): .5 mL via INTRAVENOUS

## 2023-07-09 MED ORDER — ACETAMINOPHEN 160 MG/5ML PO SOLN: 650.0000 mg | Freq: Once | ORAL | Status: DC

## 2023-07-09 MED ORDER — TRAMADOL HCL 50 MG PO TABS
50.0000 mg | ORAL_TABLET | ORAL | Status: DC | PRN
  Administered 2023-07-09: 100 mg via ORAL
  Administered 2023-07-10: 50 mg via ORAL
  Administered 2023-07-11 – 2023-07-15 (×2): 100 mg via ORAL
  Filled 2023-07-09: qty 1
  Filled 2023-07-09 (×3): qty 2

## 2023-07-09 MED ORDER — SODIUM CHLORIDE 0.45 % IV SOLN: INTRAVENOUS | Status: AC | PRN

## 2023-07-09 MED ORDER — DOCUSATE SODIUM 100 MG PO CAPS
200.0000 mg | ORAL_CAPSULE | Freq: Every day | ORAL | Status: DC
  Administered 2023-07-10 – 2023-07-13 (×4): 200 mg via ORAL
  Filled 2023-07-09 (×5): qty 2

## 2023-07-09 MED ORDER — ROCURONIUM BROMIDE 10 MG/ML (PF) SYRINGE
PREFILLED_SYRINGE | INTRAVENOUS | Status: AC
  Filled 2023-07-09: qty 20

## 2023-07-09 MED ORDER — NITROGLYCERIN 0.2 MG/ML ON CALL CATH LAB
INTRAVENOUS | Status: DC | PRN
  Administered 2023-07-09: 30 ug via INTRAVENOUS
  Administered 2023-07-09: 20 ug via INTRAVENOUS

## 2023-07-09 MED ORDER — ONDANSETRON HCL 4 MG/2ML IJ SOLN
4.0000 mg | Freq: Four times a day (QID) | INTRAMUSCULAR | Status: DC | PRN
Start: 2023-07-09 — End: 2023-07-15
  Administered 2023-07-11 – 2023-07-13 (×3): 4 mg via INTRAVENOUS
  Filled 2023-07-09 (×3): qty 2

## 2023-07-09 MED ORDER — MORPHINE SULFATE (PF) 2 MG/ML IV SOLN
1.0000 mg | INTRAVENOUS | Status: DC | PRN
  Administered 2023-07-09 (×4): 2 mg via INTRAVENOUS
  Filled 2023-07-09 (×3): qty 1

## 2023-07-09 MED ORDER — MAGNESIUM SULFATE 4 GM/100ML IV SOLN
4.0000 g | Freq: Once | INTRAVENOUS | Status: AC
  Administered 2023-07-09: 4 g via INTRAVENOUS
  Filled 2023-07-09: qty 100

## 2023-07-09 MED ORDER — DEXMEDETOMIDINE HCL IN NACL 400 MCG/100ML IV SOLN
0.0000 ug/kg/h | INTRAVENOUS | Status: DC
Start: 2023-07-09 — End: 2023-07-09

## 2023-07-09 MED ORDER — MIDAZOLAM HCL 2 MG/2ML IJ SOLN
INTRAMUSCULAR | Status: AC
Start: 2023-07-09 — End: ?
  Filled 2023-07-09: qty 2

## 2023-07-09 MED ORDER — ORAL CARE MOUTH RINSE
15.0000 mL | OROMUCOSAL | Status: DC
  Administered 2023-07-09 – 2023-07-10 (×3): 15 mL via OROMUCOSAL

## 2023-07-09 MED ORDER — LACTATED RINGERS IV SOLN: INTRAVENOUS | Status: AC

## 2023-07-09 MED ORDER — FENTANYL CITRATE (PF) 250 MCG/5ML IJ SOLN
INTRAMUSCULAR | Status: DC | PRN
  Administered 2023-07-09: 50 ug via INTRAVENOUS
  Administered 2023-07-09: 100 ug via INTRAVENOUS
  Administered 2023-07-09: 150 ug via INTRAVENOUS
  Administered 2023-07-09 (×2): 50 ug via INTRAVENOUS
  Administered 2023-07-09: 100 ug via INTRAVENOUS

## 2023-07-09 MED ORDER — 0.9 % SODIUM CHLORIDE (POUR BTL) OPTIME
TOPICAL | Status: DC | PRN
  Administered 2023-07-09: 5000 mL

## 2023-07-09 MED ORDER — ALBUMIN HUMAN 5 % IV SOLN: INTRAVENOUS | Status: DC | PRN

## 2023-07-09 MED ORDER — HEPARIN SODIUM (PORCINE) 1000 UNIT/ML IJ SOLN
INTRAMUSCULAR | Status: DC | PRN
  Administered 2023-07-09: 30000 [IU] via INTRAVENOUS

## 2023-07-09 MED ORDER — SODIUM CHLORIDE 0.9 % IV SOLN: INTRAVENOUS | Status: AC

## 2023-07-09 MED ORDER — SERTRALINE HCL 100 MG PO TABS
100.0000 mg | ORAL_TABLET | Freq: Every day | ORAL | Status: DC
  Administered 2023-07-09 – 2023-07-14 (×6): 100 mg via ORAL
  Filled 2023-07-09 (×6): qty 1

## 2023-07-09 MED ORDER — ~~LOC~~ CARDIAC SURGERY, PATIENT & FAMILY EDUCATION
Freq: Once | Status: DC
  Filled 2023-07-09: qty 1

## 2023-07-09 MED ORDER — METOPROLOL TARTRATE 12.5 MG HALF TABLET
12.5000 mg | ORAL_TABLET | Freq: Once | ORAL | Status: DC
  Filled 2023-07-09: qty 1

## 2023-07-09 MED ORDER — METOPROLOL TARTRATE 12.5 MG HALF TABLET
12.5000 mg | ORAL_TABLET | Freq: Two times a day (BID) | ORAL | Status: DC
  Filled 2023-07-09: qty 1

## 2023-07-09 MED ORDER — CLONAZEPAM 1 MG PO TABS
1.0000 mg | ORAL_TABLET | Freq: Two times a day (BID) | ORAL | Status: DC | PRN
Start: 2023-07-09 — End: 2023-07-15
  Administered 2023-07-09 – 2023-07-14 (×6): 1 mg via ORAL
  Filled 2023-07-09 (×6): qty 1
  Filled 2023-07-09: qty 2

## 2023-07-09 MED ORDER — METOCLOPRAMIDE HCL 5 MG/ML IJ SOLN
10.0000 mg | Freq: Four times a day (QID) | INTRAMUSCULAR | Status: AC
  Administered 2023-07-09 – 2023-07-10 (×6): 10 mg via INTRAVENOUS
  Filled 2023-07-09 (×6): qty 2

## 2023-07-09 MED ORDER — ORAL CARE MOUTH RINSE: 15.0000 mL | OROMUCOSAL | Status: DC | PRN

## 2023-07-09 MED ORDER — OXYCODONE HCL 5 MG PO TABS
5.0000 mg | ORAL_TABLET | ORAL | Status: DC | PRN
  Administered 2023-07-09 (×2): 10 mg via ORAL
  Administered 2023-07-10: 5 mg via ORAL
  Administered 2023-07-10 (×5): 10 mg via ORAL
  Filled 2023-07-09: qty 1
  Filled 2023-07-09 (×7): qty 2

## 2023-07-09 MED ORDER — PROPOFOL 10 MG/ML IV BOLUS
INTRAVENOUS | Status: AC
  Filled 2023-07-09: qty 20

## 2023-07-09 MED ORDER — SODIUM CHLORIDE 0.9% FLUSH: 3.0000 mL | INTRAVENOUS | Status: DC | PRN

## 2023-07-09 MED ORDER — VANCOMYCIN HCL 1000 MG IV SOLR
INTRAVENOUS | Status: DC | PRN
  Administered 2023-07-09: 1000 mL

## 2023-07-09 MED ORDER — AMIODARONE HCL 200 MG PO TABS
400.0000 mg | ORAL_TABLET | Freq: Two times a day (BID) | ORAL | Status: DC
  Administered 2023-07-10 – 2023-07-12 (×6): 400 mg via ORAL
  Filled 2023-07-09 (×7): qty 2

## 2023-07-09 MED ORDER — EPHEDRINE 5 MG/ML INJ
INTRAVENOUS | Status: AC
  Filled 2023-07-09: qty 5

## 2023-07-09 MED ORDER — BISACODYL 10 MG RE SUPP: 10.0000 mg | Freq: Every day | RECTAL | Status: DC

## 2023-07-09 MED ORDER — NOREPINEPHRINE 4 MG/250ML-% IV SOLN
0.0000 ug/min | INTRAVENOUS | Status: DC
  Administered 2023-07-10: 10 ug/min via INTRAVENOUS
  Administered 2023-07-10: 11 ug/min via INTRAVENOUS
  Filled 2023-07-09 (×4): qty 250

## 2023-07-09 MED ORDER — METOPROLOL TARTRATE 25 MG/10 ML ORAL SUSPENSION: 12.5000 mg | Freq: Two times a day (BID) | ORAL | Status: DC

## 2023-07-09 MED ORDER — CHLORHEXIDINE GLUCONATE 4 % EX SOLN: 30.0000 mL | CUTANEOUS | Status: DC

## 2023-07-09 MED ORDER — MIDAZOLAM HCL 2 MG/2ML IJ SOLN: 2.0000 mg | INTRAMUSCULAR | Status: DC | PRN

## 2023-07-09 MED ORDER — ACETAMINOPHEN 325 MG PO TABS
650.0000 mg | ORAL_TABLET | Freq: Once | ORAL | Status: AC
  Administered 2023-07-09: 650 mg via ORAL
  Filled 2023-07-09: qty 2

## 2023-07-09 MED ORDER — PANTOPRAZOLE SODIUM 40 MG IV SOLR
40.0000 mg | Freq: Every day | INTRAVENOUS | Status: AC
  Administered 2023-07-09 – 2023-07-10 (×2): 40 mg via INTRAVENOUS
  Filled 2023-07-09 (×2): qty 10

## 2023-07-09 MED ORDER — CHLORHEXIDINE GLUCONATE 0.12 % MT SOLN
15.0000 mL | OROMUCOSAL | Status: AC
  Administered 2023-07-09: 15 mL via OROMUCOSAL
  Filled 2023-07-09: qty 15

## 2023-07-09 MED ORDER — SODIUM CHLORIDE 0.9 % IV SOLN: 250.0000 mL | INTRAVENOUS | Status: AC

## 2023-07-09 MED ORDER — PHENYLEPHRINE HCL-NACL 20-0.9 MG/250ML-% IV SOLN: 0.0000 ug/min | INTRAVENOUS | Status: DC

## 2023-07-09 MED ORDER — HEPARIN SODIUM (PORCINE) 1000 UNIT/ML IJ SOLN
INTRAMUSCULAR | Status: AC
  Filled 2023-07-09: qty 1

## 2023-07-09 MED ORDER — MORPHINE SULFATE (PF) 2 MG/ML IV SOLN
INTRAVENOUS | Status: AC
  Filled 2023-07-09: qty 1

## 2023-07-09 MED ORDER — PROPOFOL 10 MG/ML IV BOLUS
INTRAVENOUS | Status: DC | PRN
  Administered 2023-07-09: 110 mg via INTRAVENOUS

## 2023-07-09 MED ORDER — ROCURONIUM BROMIDE 10 MG/ML (PF) SYRINGE
PREFILLED_SYRINGE | INTRAVENOUS | Status: DC | PRN
  Administered 2023-07-09: 30 mg via INTRAVENOUS
  Administered 2023-07-09: 70 mg via INTRAVENOUS
  Administered 2023-07-09: 50 mg via INTRAVENOUS

## 2023-07-09 MED ORDER — LACTATED RINGERS IV SOLN: INTRAVENOUS | Status: DC | PRN

## 2023-07-09 MED ORDER — EPHEDRINE SULFATE-NACL 50-0.9 MG/10ML-% IV SOSY
PREFILLED_SYRINGE | INTRAVENOUS | Status: DC | PRN
  Administered 2023-07-09: 5 mg via INTRAVENOUS
  Administered 2023-07-09 (×2): 10 mg via INTRAVENOUS

## 2023-07-09 MED ORDER — CHLORHEXIDINE GLUCONATE 0.12 % MT SOLN
15.0000 mL | Freq: Once | OROMUCOSAL | Status: AC
  Administered 2023-07-09: 15 mL via OROMUCOSAL
  Filled 2023-07-09: qty 15

## 2023-07-09 MED ORDER — ACETAMINOPHEN 500 MG PO TABS
1000.0000 mg | ORAL_TABLET | Freq: Once | ORAL | Status: AC
  Administered 2023-07-09: 1000 mg via ORAL
  Filled 2023-07-09: qty 2

## 2023-07-09 MED ORDER — ONDANSETRON HCL 4 MG/2ML IJ SOLN
INTRAMUSCULAR | Status: DC | PRN
  Administered 2023-07-09: 8 mg via INTRAVENOUS

## 2023-07-09 MED ORDER — VANCOMYCIN HCL IN DEXTROSE 1-5 GM/200ML-% IV SOLN
1000.0000 mg | Freq: Once | INTRAVENOUS | Status: AC
  Administered 2023-07-09: 1000 mg via INTRAVENOUS
  Filled 2023-07-09: qty 200

## 2023-07-09 MED ORDER — PHENYLEPHRINE 80 MCG/ML (10ML) SYRINGE FOR IV PUSH (FOR BLOOD PRESSURE SUPPORT)
PREFILLED_SYRINGE | INTRAVENOUS | Status: DC | PRN
  Administered 2023-07-09: 80 ug via INTRAVENOUS

## 2023-07-09 MED ORDER — NITROGLYCERIN IN D5W 200-5 MCG/ML-% IV SOLN: 0.0000 ug/min | INTRAVENOUS | Status: DC

## 2023-07-09 MED ORDER — DEXMEDETOMIDINE HCL IN NACL 400 MCG/100ML IV SOLN
0.0000 ug/kg/h | INTRAVENOUS | Status: DC
Start: 2023-07-09 — End: 2023-07-11

## 2023-07-09 MED ORDER — ONDANSETRON HCL 4 MG/2ML IJ SOLN
INTRAMUSCULAR | Status: AC
  Filled 2023-07-09: qty 4

## 2023-07-09 MED ORDER — ESTRADIOL 0.1 MG/GM VA CREA
1.0000 | TOPICAL_CREAM | VAGINAL | Status: DC
Start: 2023-07-09 — End: 2023-07-09

## 2023-07-09 MED ORDER — BISACODYL 5 MG PO TBEC
10.0000 mg | DELAYED_RELEASE_TABLET | Freq: Every day | ORAL | Status: DC
  Administered 2023-07-10 – 2023-07-13 (×4): 10 mg via ORAL
  Filled 2023-07-09 (×5): qty 2

## 2023-07-09 MED ORDER — LORATADINE 10 MG PO TABS
10.0000 mg | ORAL_TABLET | Freq: Every day | ORAL | Status: DC
  Administered 2023-07-10 – 2023-07-15 (×6): 10 mg via ORAL
  Filled 2023-07-09 (×7): qty 1

## 2023-07-09 MED ORDER — PANTOPRAZOLE SODIUM 40 MG PO TBEC
40.0000 mg | DELAYED_RELEASE_TABLET | Freq: Every day | ORAL | Status: DC
  Administered 2023-07-11 – 2023-07-15 (×5): 40 mg via ORAL
  Filled 2023-07-09 (×5): qty 1

## 2023-07-09 MED ORDER — ALBUMIN HUMAN 5 % IV SOLN
250.0000 mL | INTRAVENOUS | Status: DC | PRN
  Administered 2023-07-09 – 2023-07-10 (×4): 12.5 g via INTRAVENOUS
  Filled 2023-07-09 (×2): qty 250

## 2023-07-09 MED ORDER — DEXTROSE 50 % IV SOLN: 0.0000 mL | INTRAVENOUS | Status: DC | PRN

## 2023-07-09 MED ORDER — MIDAZOLAM HCL (PF) 5 MG/ML IJ SOLN
INTRAMUSCULAR | Status: DC | PRN
  Administered 2023-07-09: 1 mg via INTRAVENOUS
  Administered 2023-07-09: 3 mg via INTRAVENOUS

## 2023-07-09 MED ORDER — ASPIRIN 81 MG PO CHEW
324.0000 mg | CHEWABLE_TABLET | Freq: Every day | ORAL | Status: DC
  Filled 2023-07-09: qty 4

## 2023-07-09 MED ORDER — PROTAMINE SULFATE 10 MG/ML IV SOLN
INTRAVENOUS | Status: DC | PRN
  Administered 2023-07-09: 220 mg via INTRAVENOUS

## 2023-07-09 MED ORDER — INSULIN REGULAR(HUMAN) IN NACL 100-0.9 UT/100ML-% IV SOLN: INTRAVENOUS | Status: DC

## 2023-07-09 MED ORDER — CHLORHEXIDINE GLUCONATE CLOTH 2 % EX PADS
6.0000 | MEDICATED_PAD | Freq: Every day | CUTANEOUS | Status: DC
  Administered 2023-07-09 – 2023-07-15 (×7): 6 via TOPICAL

## 2023-07-09 MED ORDER — SODIUM CHLORIDE 0.9% FLUSH
3.0000 mL | Freq: Two times a day (BID) | INTRAVENOUS | Status: DC
  Administered 2023-07-10 – 2023-07-14 (×10): 3 mL via INTRAVENOUS

## 2023-07-09 MED ORDER — ACETAMINOPHEN 500 MG PO TABS
1000.0000 mg | ORAL_TABLET | Freq: Four times a day (QID) | ORAL | Status: AC
Start: 2023-07-10 — End: 2023-07-14
  Administered 2023-07-10 – 2023-07-14 (×17): 1000 mg via ORAL
  Filled 2023-07-09 (×19): qty 2

## 2023-07-09 MED ORDER — FENTANYL CITRATE (PF) 250 MCG/5ML IJ SOLN
INTRAMUSCULAR | Status: AC
  Filled 2023-07-09: qty 5

## 2023-07-09 MED ORDER — PHENYLEPHRINE 80 MCG/ML (10ML) SYRINGE FOR IV PUSH (FOR BLOOD PRESSURE SUPPORT)
PREFILLED_SYRINGE | INTRAVENOUS | Status: AC
  Filled 2023-07-09: qty 10

## 2023-07-09 MED ORDER — CEFAZOLIN SODIUM-DEXTROSE 2-4 GM/100ML-% IV SOLN
2.0000 g | Freq: Three times a day (TID) | INTRAVENOUS | Status: AC
Start: 2023-07-09 — End: 2023-07-11
  Administered 2023-07-09 – 2023-07-11 (×6): 2 g via INTRAVENOUS
  Filled 2023-07-09 (×6): qty 100

## 2023-07-09 MED ORDER — ASPIRIN 81 MG PO CHEW
324.0000 mg | CHEWABLE_TABLET | Freq: Once | ORAL | Status: AC
  Administered 2023-07-09: 324 mg via ORAL
  Filled 2023-07-09: qty 4

## 2023-07-09 MED ORDER — METOPROLOL TARTRATE 5 MG/5ML IV SOLN: 2.5000 mg | INTRAVENOUS | Status: DC | PRN

## 2023-07-09 MED ORDER — MIDAZOLAM HCL 2 MG/2ML IJ SOLN
INTRAMUSCULAR | Status: AC
  Filled 2023-07-09: qty 2

## 2023-07-09 MED ORDER — PROTAMINE SULFATE 10 MG/ML IV SOLN
INTRAVENOUS | Status: AC
Start: 2023-07-09 — End: ?
  Filled 2023-07-09: qty 25

## 2023-07-09 MED ORDER — ASPIRIN 325 MG PO TBEC
325.0000 mg | DELAYED_RELEASE_TABLET | Freq: Every day | ORAL | Status: DC
  Administered 2023-07-10 – 2023-07-15 (×6): 325 mg via ORAL
  Filled 2023-07-09 (×6): qty 1

## 2023-07-09 MED ORDER — ACETAMINOPHEN 160 MG/5ML PO SOLN: 1000.0000 mg | Freq: Four times a day (QID) | ORAL | Status: AC

## 2023-07-09 MED FILL — Potassium Chloride Inj 2 mEq/ML: INTRAVENOUS | Qty: 40 | Status: AC

## 2023-07-09 MED FILL — Heparin Sodium (Porcine) Inj 1000 Unit/ML: Qty: 1000 | Status: AC

## 2023-07-09 MED FILL — Lidocaine HCl Local Preservative Free (PF) Inj 2%: INTRAMUSCULAR | Qty: 14 | Status: AC

## 2023-07-09 SURGICAL SUPPLY — 72 items
ADAPTER CARDIO PERF ANTE/RETRO (ADAPTER) IMPLANT
ANTIFOG SOL W/FOAM PAD STRL (MISCELLANEOUS) ×4 IMPLANT
BAG DECANTER FOR FLEXI CONT (MISCELLANEOUS) ×2 IMPLANT
BLADE CLIPPER SURG (BLADE) ×2 IMPLANT
BLADE STERNUM SYSTEM 6 (BLADE) ×2 IMPLANT
CANISTER SUCT 3000ML PPV (MISCELLANEOUS) ×2 IMPLANT
CANN PRFSN 3/8XCNCT ST RT ANG (MISCELLANEOUS) ×2 IMPLANT
CANNULA NON VENT 20FR 12 (CANNULA) ×2 IMPLANT
CANNULA NON VENT 22FR 12 (CANNULA) ×2 IMPLANT
CANNULA PRFSN 3/8XCNCT RT ANG (MISCELLANEOUS) IMPLANT
CANNULA SUMP PERICARDIAL (CANNULA) ×2 IMPLANT
CANNULA VRC MALB SNGL STG 36FR (MISCELLANEOUS) IMPLANT
CATH HEART VENT LEFT (CATHETERS) ×2 IMPLANT
CATH RETROPLEGIA CORONARY 14FR (CATHETERS) IMPLANT
CATH ROBINSON RED A/P 18FR (CATHETERS) ×6 IMPLANT
CATH THOR STR 32F SOFT 20 RADI (CATHETERS) ×2 IMPLANT
CATH THORACIC 28FR RT ANG (CATHETERS) ×2 IMPLANT
CLAMP SUTURE YELLOW 5 PAIRS (MISCELLANEOUS) ×2 IMPLANT
CLIP TI MEDIUM 24 (CLIP) IMPLANT
CLIP TI WIDE RED SMALL 24 (CLIP) IMPLANT
CONNECTOR 1/2X3/8X1/2 3WAY (MISCELLANEOUS) IMPLANT
DEVICE SUT CK QUICK LOAD MINI (Prosthesis & Implant Heart) IMPLANT
DRAPE CV SPLIT W-CLR ANES SCRN (DRAPES) ×2 IMPLANT
DRAPE INCISE IOBAN 66X45 STRL (DRAPES) ×2 IMPLANT
DRAPE PERI GROIN 82X75IN TIB (DRAPES) ×2 IMPLANT
DRSG AQUACEL AG ADV 3.5X10 (GAUZE/BANDAGES/DRESSINGS) ×2 IMPLANT
DRSG COVADERM 4X14 (GAUZE/BANDAGES/DRESSINGS) IMPLANT
ELECT CAUTERY BLADE 6.4 (BLADE) ×2 IMPLANT
ELECT REM PT RETURN 9FT ADLT (ELECTROSURGICAL) ×4 IMPLANT
ELECTRODE REM PT RTRN 9FT ADLT (ELECTROSURGICAL) ×4 IMPLANT
FELT TEFLON 1X6 (MISCELLANEOUS) ×2 IMPLANT
GAUZE SPONGE 4X4 12PLY STRL (GAUZE/BANDAGES/DRESSINGS) ×2 IMPLANT
GOWN STRL REUS W/ TWL LRG LVL3 (GOWN DISPOSABLE) ×12 IMPLANT
HEMOSTAT SURGICEL 2X14 (HEMOSTASIS) IMPLANT
INSERT FOGARTY XLG (MISCELLANEOUS) ×2 IMPLANT
KIT BASIN OR (CUSTOM PROCEDURE TRAY) ×2 IMPLANT
KIT SUT CK MINI COMBO 4X17 (Prosthesis & Implant Heart) IMPLANT
KIT TURNOVER KIT B (KITS) ×2 IMPLANT
LINE VENT (MISCELLANEOUS) IMPLANT
NS IRRIG 1000ML POUR BTL (IV SOLUTION) ×12 IMPLANT
ORGANIZER SUTURE GABBAY-FRATER (MISCELLANEOUS) ×2 IMPLANT
PACK E OPEN HEART (SUTURE) ×2 IMPLANT
PACK OPEN HEART (CUSTOM PROCEDURE TRAY) ×2 IMPLANT
PAD ARMBOARD 7.5X6 YLW CONV (MISCELLANEOUS) ×4 IMPLANT
PAD ELECT DEFIB RADIOL ZOLL (MISCELLANEOUS) ×2 IMPLANT
PENCIL BUTTON HOLSTER BLD 10FT (ELECTRODE) ×2 IMPLANT
POSITIONER HEAD DONUT 9IN (MISCELLANEOUS) ×2 IMPLANT
RING ANNULOPLASTY SIMULUS 38 (Prosthesis & Implant Heart) IMPLANT
SET MPS 3-ND DEL (MISCELLANEOUS) IMPLANT
SOLUTION ANTFG W/FOAM PAD STRL (MISCELLANEOUS) ×2 IMPLANT
SUT ETHIBOND 3 0 SH 1 (SUTURE) IMPLANT
SUT MNCRL AB 4-0 PS2 18 (SUTURE) ×4 IMPLANT
SUT PROLENE 4 0 SH DA (SUTURE) ×6 IMPLANT
SUT PROLENE 4-0 RB1 .5 CRCL 36 (SUTURE) ×4 IMPLANT
SUT PROLENE 5 0 RB 2 (SUTURE) IMPLANT
SUT PROLENE 6 0 C 1 30 (SUTURE) IMPLANT
SUT STEEL 6MS V (SUTURE) ×2 IMPLANT
SUT STEEL SZ 6 DBL 3X14 BALL (SUTURE) ×4 IMPLANT
SUT TEM PAC WIRE 2 0 SH (SUTURE) IMPLANT
SUT VIC AB 0 CTX36XBRD ANTBCTR (SUTURE) ×4 IMPLANT
SUT VIC AB 2-0 CT1 TAPERPNT 27 (SUTURE) ×4 IMPLANT
SYSTEM SAHARA CHEST DRAIN ATS (WOUND CARE) ×2 IMPLANT
TAG SUTURE CLAMP YLW 5PR (MISCELLANEOUS) ×2 IMPLANT
TAPE CLOTH SURG 4X10 WHT LF (GAUZE/BANDAGES/DRESSINGS) IMPLANT
TAPE PAPER 2X10 WHT MICROPORE (GAUZE/BANDAGES/DRESSINGS) IMPLANT
TOWEL GREEN STERILE (TOWEL DISPOSABLE) ×2 IMPLANT
TOWEL GREEN STERILE FF (TOWEL DISPOSABLE) ×2 IMPLANT
TRAY FOLEY SLVR 16FR TEMP STAT (SET/KITS/TRAYS/PACK) ×2 IMPLANT
UNDERPAD 30X36 HEAVY ABSORB (UNDERPADS AND DIAPERS) ×2 IMPLANT
VENT LEFT HEART 12002 (CATHETERS) ×2 IMPLANT
VRC MALLEABLE SINGLE STG 36FR (MISCELLANEOUS) ×2 IMPLANT
WATER STERILE IRR 1000ML POUR (IV SOLUTION) ×4 IMPLANT

## 2023-07-09 NOTE — Anesthesia Postprocedure Evaluation (Signed)
Anesthesia Post Note  Patient: Tracy Dyer  Procedure(s) Performed: MITRAL VALVE REPAIR USING SIMULUS ANNULOPLASTY BAND (Chest) TRANSESOPHAGEAL ECHOCARDIOGRAM     Patient location during evaluation: SICU Anesthesia Type: General Level of consciousness: sedated Pain management: pain level controlled Vital Signs Assessment: post-procedure vital signs reviewed and stable Respiratory status: patient remains intubated per anesthesia plan Cardiovascular status: stable Postop Assessment: no apparent nausea or vomiting Anesthetic complications: no  No notable events documented.  Last Vitals:  Vitals:   07/09/23 1135 07/09/23 1223  BP:    Pulse: 88   Resp: 16   Temp:    SpO2: 100% 100%    Last Pain:  Vitals:   07/09/23 0625  TempSrc:   PainSc: 0-No pain                 Chayil Gantt,W. EDMOND

## 2023-07-09 NOTE — Hospital Course (Signed)
History of Present Illness:     Pt is a 52 yo female who reportedly was told had mitral valve issues and regurgitation for years. Pt had done well until recently when in [place] had an event where she "passed out' and was found to have what she thought was a low heart rate and pressure. She also feels some "electrical" pulse on occasion in her chest up towards the neck area and a "buzziness". She has no appreciable DOE or fatigue. She has had COVID three times and feels has had lower heart rates and BP since. She has had breast cancer sp lumpectomy, aggressive chemo and RT therapy. She also has a hypercoagulable state inherited where she need to be on heparin shots during her pregnancy. 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 She has had a R and L cath without PHTN nor CAD. Pt presents for surgical consideration   Dr. Leafy Ro reviewed the patient's diagnostic studies and determined she would benefit from surgical intervention. He reviewed the treatment options as well as the risks and benefits of surgery. Tracy Dyer was agreeable to proceed with surgery.  Hospital Course: [name] arrived at [facility] and was brought to the operating room on 07/09/23. She underwent mitral valve repair utilizing a 38mm Simulus Semi rigid band and an edge to edge repair. She tolerated the procedure well and was transferred to the SICU in stable condition.   Postoperative hospital course:  The patient was weaned from the ventilator using standard post cardiac surgical protocols without difficulty.  A PCCM consultation was obtained to assist with ICU management.  She initially did require inotropic support and she did have notable postoperative vasoplegia.  Inotropes and vasopressors were weaned over time but she did require initiation of midodrine.  She has expected postoperative volume overload related to surgical procedure and has required diuresis.   She has an expected acute blood loss anemia which is being monitored clinically and not at the transfusion threshold.  Renal function has remained within normal limits.

## 2023-07-09 NOTE — Anesthesia Procedure Notes (Signed)
Arterial Line Insertion Start/End12/01/2023 6:45 AM, 07/09/2023 6:45 AM Performed by: Darryl Nestle, CRNA, CRNA  Patient location: Pre-op. Preanesthetic checklist: patient identified, IV checked, site marked, risks and benefits discussed, surgical consent, monitors and equipment checked, pre-op evaluation, timeout performed and anesthesia consent Lidocaine 1% used for infiltration Right, radial was placed Catheter size: 20 G Hand hygiene performed  and maximum sterile barriers used   Attempts: 1 Procedure performed without using ultrasound guided technique. Following insertion, dressing applied and Biopatch. Post procedure assessment: normal and unchanged  Patient tolerated the procedure well with no immediate complications.

## 2023-07-09 NOTE — Consult Note (Signed)
NAME:  Tracy Dyer, MRN:  [id number], DOB:  04/13/1971, LOS: 0 ADMISSION DATE:  07/09/2023, CONSULTATION DATE:  07/09/2023 REFERRING MD: Dr. Leafy Ro, CHIEF COMPLAINT: Medical management  History of Present Illness:  Tracy Dyer is a 52 year old female with a past medical history significant for breast cancer s/p lumpectomy treated with chemo and radiation, anxiety and depression who presented 12/6 for elective repair of mitral valve stenosis per Dr. Leafy Ro.  Pertinent  Medical History  Breast cancer s/p lumpectomy treated with chemo and radiation Anxiety Depression  Significant Hospital Events: Including procedures, antibiotic start and stop dates in addition to other pertinent events   12/6 presented for elective mitral valve repair per Dr. Leafy Ro  Interim History / Subjective:  As above  Objective   Blood pressure (!) 98/52, pulse (!) 55, temperature 97.6 F (36.4 C), temperature source Oral, resp. rate 18, height 5\' 6"  (1.676 m), weight 65.8 kg, last menstrual period 09/05/2020, SpO2 95%.        Intake/Output Summary (Last 24 hours) at 07/09/2023 1010 Last data filed at 07/09/2023 1003 Gross per 24 hour  Intake --  Output 800 ml  Net -800 ml   Filed Weights   07/09/23 4010  Weight: 65.8 kg    Examination: General: Acute on chronically ill appearing middle aged female lying in bed on mechanical ventilation, in NAD HEENT: ETT, MM pink/moist, PERRL,  Neuro: Sedated on vent, starting to wake, unable to follow commands currently  CV: s1s2 regular rate and rhythm, no murmur, rubs, or gallops,  PULM:  Clear to auscultation, no increased work of breathing, no added breath sounds, tolerating vent  GI: soft, bowel sounds active in all 4 quadrants, non-tender, non-distended Extremities: warm/dry, no edema  Skin: no rashes or lesions  Resolved Hospital Problem list     Assessment & Plan:  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 Head of bed elevated 30 degrees. Plateau pressures less than 30 cm H20.  Follow intermittent chest x-ray and ABG.   Ensure adequate pulmonary hygiene  Follow cultures  VAP bundle in place  PAD protocol  Anxiety and depression -Home medications include Klonopin P:  Hold home medications in favor of PAD protocol  Best Practice (right click and "Reselect all SmartList Selections" daily)   Diet/type: NPO DVT prophylaxis:  Pressure ulcer(s): not present on admission  GI prophylaxis: PPI Lines: Central line Foley:  Yes, and it is still needed Code Status:  full code Last date of multidisciplinary goals of care discussion: Per TCTS   Labs   CBC: Recent Labs  Lab 07/07/23 1157 07/09/23 0753 07/09/23 0757 07/09/23 0839 07/09/23 0856 07/09/23 0859 07/09/23 0950  WBC 4.8  --   --   --   --   --   --   HGB 14.3   < > 11.6* 10.2* 8.2* 8.2* 8.2*  HCT 41.6   < > 34.0* 30.0* 24.0* 24.0* 24.0*  MCV 90.6  --   --   --   --   --   --   PLT 188  --   --   --   --   --   --    < > =  values in this interval not displayed.    Basic Metabolic Panel: Recent Labs  Lab 07/07/23 1157 07/09/23 0753 07/09/23 0757 07/09/23 0839 07/09/23 0856 07/09/23 0859 07/09/23 0950  NA 135   < > 141 140 142 141 139  K 4.0   < > 3.9 4.0 4.3 4.2 4.7  CL 103  --  104 107 100  --  104  CO2 24  --   --   --   --   --   --   GLUCOSE 87  --  117* 94 87  --  149*  BUN 10  --  11 9 8   --  9  CREATININE 0.80  --  0.70 0.60 0.50  --  0.50  CALCIUM 9.2  --   --   --   --   --   --    < > = values in  this interval not displayed.   GFR: Estimated Creatinine Clearance: 76.1 mL/min (by C-G formula based on SCr of 0.5 mg/dL). Recent Labs  Lab 07/07/23 1157  WBC 4.8    Liver Function Tests: Recent Labs  Lab 07/07/23 1157  AST 33  ALT 34  ALKPHOS 67  BILITOT 0.8  PROT 6.5  ALBUMIN 3.9   No results for input(s): "LIPASE", "AMYLASE" in the last 168 hours. No results for input(s): "AMMONIA" in the last 168 hours.  ABG    Component Value Date/Time   PHART 7.401 07/09/2023 0859   PCO2ART 38.1 07/09/2023 0859   PO2ART 331 (H) 07/09/2023 0859   HCO3 23.7 07/09/2023 0859   TCO2 25 07/09/2023 0950   ACIDBASEDEF 1.0 07/09/2023 0859   O2SAT 100 07/09/2023 0859     Coagulation Profile: Recent Labs  Lab 07/07/23 1157  INR 1.0    Cardiac Enzymes: No results for input(s): "CKTOTAL", "CKMB", "CKMBINDEX", "TROPONINI" in the last 168 hours.  HbA1C: Hgb A1c MFr Bld  Date/Time Value Ref Range Status  07/07/2023 11:57 AM 4.7 (L) 4.8 - 5.6 % Final    Comment:    (NOTE) Pre diabetes:          5.7%-6.4%  Diabetes:              >6.4%  Glycemic control for   <7.0% adults with diabetes     CBG: No results for input(s): "GLUCAP" in the last 168 hours.  Review of Systems:   Unable to assess  Past Medical History:  She,  has a past medical history of Anxiety, Arthritis, Blood dyscrasia, Cancer (HCC), Depression, Family history of breast cancer, Family history of lung cancer, Family history of ovarian cancer, GERD (gastroesophageal reflux disease), Heart murmur, History of radiation therapy, Pneumonia, Seasonal allergies, and Stroke (HCC) (1990).   Surgical History:   Past Surgical History:  Procedure Laterality Date   BREAST BIOPSY Left 2019   x 2 biopsy   BREAST EXCISIONAL BIOPSY Left    BREAST LUMPECTOMY WITH RADIOACTIVE SEED AND SENTINEL LYMPH NODE BIOPSY Left 02/26/2021   Procedure: LEFT BREAST LUMPECTOMY WITH RADIOACTIVE SEED x2; LEFT AXILLARY SENTINEL NODE BIOPSY;   Surgeon: Emelia Loron, MD;  Location: [facility];  Service: General;  Laterality: Left;   BREAST LUMPECTOMY WITH RADIOFREQUENCY TAG IDENTIFICATION Left 04/18/2021   Procedure: LEFT BREAST MRI WIRE GUIDED EXCISION;  Surgeon: Emelia Loron, MD;  Location: [facility];  Service: General;  Laterality: Left;   BREAST REDUCTION SURGERY Bilateral 04/18/2021   Procedure: MAMMARY REDUCTION  (BREAST) LEFT ONCOPLASTIC BREAST RECONSTRUCTION,  RIGHT BREAST REDUCTION;  Surgeon: Glenna Fellows, MD;  Location: [facility];  Service: Plastics;  Laterality: Bilateral;   CHOLECYSTECTOMY  2008   PORT-A-CATH REMOVAL Right 04/18/2021   Procedure: REMOVAL PORT-A-CATH;  Surgeon: Emelia Loron, MD;  Location:  SURGERY CENTER;  Service: General;  Laterality: Right;   PORTACATH PLACEMENT N/A 09/05/2020   Procedure: INSERTION PORT-A-CATH;  Surgeon: Emelia Loron, MD;  Location: [facility];  Service: General;  Laterality: N/A;  PEC BLOCK   RIGHT/LEFT HEART CATH AND CORONARY ANGIOGRAPHY N/A 05/28/2023   Procedure: RIGHT/LEFT HEART CATH AND CORONARY ANGIOGRAPHY;  Surgeon: [name], MD;  Location: [facility] INVASIVE CV LAB;  Service: Cardiovascular;  Laterality: N/A;   TEE WITHOUT CARDIOVERSION N/A 05/14/2023   Procedure: TRANSESOPHAGEAL ECHOCARDIOGRAM;  Surgeon: Maisie Fus, MD;  Location: [facility] INVASIVE CV LAB;  Service: Cardiovascular;  Laterality: N/A;   WISDOM TOOTH EXTRACTION       Social History:   reports that she has never smoked. She has never used smokeless tobacco. She reports that she does not currently use alcohol. She reports that she does not use drugs.   Family History:  Her family history includes Allergic rhinitis in her brother; Breast cancer in her mother; Cancer in her maternal aunt, maternal uncle, and paternal great-grandmother; Eczema in her father; Heart Problems in her paternal grandfather; Lung cancer (age of onset: 30) in her  maternal grandfather; Melanoma in her cousin; Ovarian cancer in her paternal great-grandmother.   Allergies Allergies  Allergen Reactions   Sulfa Antibiotics     Rash, urticarial   Propoxyphene Anxiety, Rash and Other (See Comments)   Sulfamethoxazole Rash    Feel sun burn     Home Medications  Prior to Admission medications   Medication Sig Start Date End Date Taking? Authorizing Provider  clonazePAM (KLONOPIN) 1 MG tablet Take 1 tablet (1 mg total) by mouth 2 (two) times daily as needed for anxiety. 05/28/23  Yes   Collagen-Vitamin C-Biotin (COLLAGEN PO) Take 1 Scoop by mouth daily.   Yes [provider]  Creatine POWD Take 1 Scoop by mouth daily.   Yes [provider]  estradiol (ESTRACE VAGINAL) 0.1 MG/GM vaginal cream Place 1 Applicatorful vaginally 3 (three) times a week. 05/05/23  Yes Causey, Larna Daughters, NP  fexofenadine (ALLEGRA) 180 MG tablet Take 180 mg by mouth daily as needed for allergies or rhinitis.   Yes [provider]  MAGNESIUM CITRATE PO Take 405 mg by mouth daily.   Yes [provider]  Misc Natural Products (JOINT SUPPORT COMPLEX PO) Take 4 capsules by mouth daily.   Yes [provider]  Omega-3 Fatty Acids (FISH OIL PO) Take 813 mg by mouth daily.   Yes [provider]  sertraline (ZOLOFT) 100 MG tablet Take one tablet (100 mg dose) by mouth at bedtime. 03/12/23  Yes   Vitamin D-Vitamin K (VITAMIN D2 + K1 PO) Take 2 drops by mouth daily. Liquid   Yes [provider]  Black Cohosh 540 MG CAPS Take 540 mg by mouth daily.    [provider]  clonazePAM (KLONOPIN) 1 MG tablet Take 1 tablet (1 mg total) by mouth 2 (two) times daily as needed. 03/12/23     Prenatal Vit-Fe Fumarate-FA (PRENATAL PO) Take 3 capsules by mouth daily.    [provider]  tirzepatide (ZEPBOUND) 15 MG/0.5ML Pen Inject 15 mg into the skin once a week. 05/19/23     tirzepatide (ZEPBOUND) 15 MG/0.5ML Pen Inject  15 mg  into the skin every 7 (seven) days. Patient not taking: Reported on 07/06/2023 05/27/23        Critical care time:   CRITICAL CARE Performed by: Braniyah Besse D. Harris   Total critical care time: 42 minutes  Critical care time was exclusive of separately billable procedures and treating other patients.  Critical care was necessary to treat or prevent imminent or life-threatening deterioration.  Critical care was time spent personally by me on the following activities: development of treatment plan with patient and/or surrogate as well as nursing, discussions with consultants, evaluation of patient's response to treatment, examination of patient, obtaining history from patient or surrogate, ordering and performing treatments and interventions, ordering and review of laboratory studies, ordering and review of radiographic studies, pulse oximetry and re-evaluation of patient's condition.  Jamilynn Whitacre D. Harris, NP-C [facility] Personal contact information can be found on Amion  If no contact or response made please call [phone] 07/09/2023, 11:16 AM

## 2023-07-09 NOTE — Anesthesia Procedure Notes (Signed)
Central Venous Catheter Insertion Performed by: Gaynelle Adu, MD, anesthesiologist Start/End12/01/2023 6:50 AM, 07/09/2023 7:10 AM Patient location: Pre-op. Preanesthetic checklist: patient identified, IV checked, site marked, risks and benefits discussed, surgical consent, monitors and equipment checked, pre-op evaluation, timeout performed and anesthesia consent Position: Trendelenburg Lidocaine 1% used for infiltration and patient sedated Hand hygiene performed , maximum sterile barriers used  and Seldinger technique used Catheter size: 9 Fr Total catheter length 10. Central line was placed.MAC introducer Swan type:thermodilution Procedure performed using ultrasound guided technique. Ultrasound Notes:anatomy identified, needle tip was noted to be adjacent to the nerve/plexus identified, no ultrasound evidence of intravascular and/or intraneural injection and image(s) printed for medical record Attempts: 1 Following insertion, line sutured, dressing applied and Biopatch. Post procedure assessment: blood return through all ports, free fluid flow and no air  Patient tolerated the procedure well with no immediate complications.

## 2023-07-09 NOTE — Procedures (Signed)
Extubation Procedure Note  Patient Details:   Name: Tracy Dyer DOB: [birth year] MRN: [id number]   Airway Documentation:    Vent end date: 07/09/23 Vent end time: 1312   Evaluation  O2 sats: stable throughout Complications: No apparent complications Patient did tolerate procedure well. Bilateral Breath Sounds: Clear   Yes  Positive cuff leak. NIF-20 VC 1.0 Placed on 4L [place]  Lajean Manes 07/09/2023, 1:12 PM

## 2023-07-09 NOTE — Op Note (Signed)
CARDIOVASCULAR SURGERY OPERATIVE NOTE  07/09/2023 Elsee Berghorst [id number]  Surgeon:  Ashley Akin, MD  First Assistant: Aloha Gell [facility]                               An experienced assistant was required given the complexity of this surgery and the standard of surgical care. The assistant was needed for exposure, dissection, suctioning, retraction of delicate tissues and sutures, instrument exchange and for overall help during this procedure.     Preoperative Diagnosis: Severe Mitral Regurgitation (Barlow's Valve)   Postoperative Diagnosis:  Same   Procedure: Complex Mitral valve repair utilizing a 38mm Simulus Semi Rigid Band and an edge to edge repair  Anesthesia:  General Endotracheal   Clinical History/Surgical Indication: 52 yo female with NYHA class 1-2 symptoms of Barlows mitral valve severe regurgitation. He symptoms are difficult to assess to the MV however the severity of the regurgitation seems to coincide with her symptoms of lightheadedness. We discussed the pathology of her valve and the increased difficulty in repair and possible need for mechanical valve replacement.   Findings: Classic Barlow's Valve with all segments prolapsing. At conclusion of repair there was no MR and a mean gradient of  Preparation:  The patient was seen in the preoperative holding area and the correct patient, correct operation were confirmed with the patient after reviewing the medical record and catheterization. The consent was signed by me. Preoperative antibiotics were given. A pulmonary arterial line and radial arterial line were placed by the anesthesia team. The patient was taken back to the operating room and positioned supine on the operating room table. After being placed under general endotracheal anesthesia by the anesthesia team a foley catheter was placed. The neck, chest, abdomen, and both legs were prepped with betadine soap and solution and draped in the usual sterile  manner. A surgical time-out was taken and the correct patient and operative procedure were confirmed with the nursing and anesthesia staff.  Operation: A median sternotomy incision was then created and sternal divided with a sternal saw.  Pericardial well was developed and heparin was delivered.  Aorta was cannulated with a 20 [place] Starns aortic cannula and a 36 French straight cannula was placed in the right atrium and directed towards the inferior vena cava.  A 28 French right angle cannula was placed in the superior vena cava for additional venous drainage.  With adequate confirmation of anticoagulation cardiopulmonary bypass was instituted.  An antegrade cardioplegia catheter was placed in the ascending aorta. Aortic cross-clamp was placed and 1 L of cold blood [place] cardioplegia was delivered antegrade and it should be noted that a reanimation dose was delivered just prior to cross-clamp removal.  The left atrium was entered in the intra-atrial groove and adequate exposure of the mitral valve was obtained.  The left atrial appendage was oversewn with a running 4-0 Prolene suture. The valve was examined and was classic Barlow's.  Due to the extent of the disease it was felt a resectional technique will probably result in inadequate function of the valve and the technique of ring with an edge-to-edge repair was chosen. 3-0 Ethibond sutures were then placed in the posterior annulus and a 38 mm semirigid band was chosen.  This was secured with the cor knot system.  Following this a 4-0 Prolene suture was then placed in a 2 and was sutured to P2 at the base.  With saline load  test there was excellent competence of the valve.  The left atrium was then closed over ventricular sump and with the patient in headdown position the aortic cross-clamp was removed. Ventricular and atrial pacing wires were placed and brought out through inferior stab wounds and secured.  With adequate de-airing the patient was  then weaned from cardiopulmonary bypass on a small amount of Levophed and with adequate hemodynamics protamine was delivered.  Patient was then decannulated and sites oversewn were necessary.  Chest tubes were brought out through inferior stab wounds and secured with adequate hemostasis the sternum was reapproximated with interrupted stainless steel wires and the presternal subcutaneous tissue and skin were closed multiple layers of absorbable suture.  Sterile dressings were applied.

## 2023-07-09 NOTE — Brief Op Note (Signed)
07/09/2023  11:17 AM  PATIENT:  Tracy Dyer  52 y.o. female  PRE-OPERATIVE DIAGNOSIS:  SEVERE MITRAL REGURGITATION  POST-OPERATIVE DIAGNOSIS:  SEVERE MITRAL REGURGITATION  PROCEDURE:  Procedure(s): MITRAL VALVE REPAIR USING SIMULUS ANNULOPLASTY BAND (N/A) TRANSESOPHAGEAL ECHOCARDIOGRAM (N/A)  SURGEON:  Surgeons and Role:    [name], MD - Primary  PHYSICIAN ASSISTANT:  Aloha Gell PA-C  ASSISTANTS: Valene Bors RNFA student   ANESTHESIA:   general  EBL:  650 mL   BLOOD ADMINISTERED:none  DRAINS:  Mediastinal drains    LOCAL MEDICATIONS USED:  NONE  SPECIMEN:  No Specimen  DISPOSITION OF SPECIMEN:  N/A  COUNTS:  YES  DICTATION: .Dragon Dictation  PLAN OF CARE: Admit to inpatient   PATIENT DISPOSITION:  ICU - intubated and hemodynamically stable.   Delay start of Pharmacological VTE agent (>24hrs) due to surgical blood loss or risk of bleeding: yes

## 2023-07-09 NOTE — Discharge Summary (Signed)
[address]       [place] [address]             [phone]    Physician Discharge Summary  Patient ID: Tracy Dyer MRN: [id number] DOB/AGE: Sep 09, 1970 52 y.o.  Admit date: 07/09/2023 Discharge date: 07/12/2023  Admission Diagnoses:  Patient Active Problem List   Diagnosis Date Noted   Vitamin D deficiency 05/04/2023   Right lower quadrant pain 05/04/2023   Squamous cell skin cancer 04/19/2023   Cervical radiculopathy 07/08/2022   Occipital neuralgia of right side 07/01/2021   Peripheral vertigo 07/01/2021   Genetic testing 09/04/2020   Family history of breast cancer    Family history of ovarian cancer    Family history of lung cancer    Malignant neoplasm of upper-outer quadrant of left breast in female, estrogen receptor positive (HCC) 08/21/2020   Headache 04/10/2017   Anxiety 11/16/2013   Hypercoagulable state (HCC) 11/16/2013   TIA (transient ischemic attack) 11/15/2013   Chest discomfort 01/20/2012   Factor V Leiden (HCC) 01/20/2012   MVP (mitral valve prolapse) 01/20/2012   Acute bronchitis 12/23/2011     Discharge Diagnoses:  Patient Active Problem List   Diagnosis Date Noted   S/P MVR (mitral valve repair) 07/09/2023   Vitamin D deficiency 05/04/2023   Right lower quadrant pain 05/04/2023   Squamous cell skin cancer 04/19/2023   Cervical radiculopathy 07/08/2022   Occipital neuralgia of right side 07/01/2021   Peripheral vertigo 07/01/2021   Genetic testing 09/04/2020   Family history of breast cancer    Family history of ovarian cancer    Family history of lung cancer    Malignant neoplasm of upper-outer quadrant of left breast in female, estrogen receptor positive (HCC) 08/21/2020   Headache 04/10/2017   Anxiety 11/16/2013   Hypercoagulable state (HCC) 11/16/2013   TIA (transient ischemic attack) 11/15/2013   Chest discomfort 01/20/2012   Factor V Leiden (HCC) 01/20/2012   MVP (mitral valve prolapse) 01/20/2012   Acute  bronchitis 12/23/2011     Discharged Condition: {condition:18240}  History of Present Illness:     Pt is a 52 yo female who reportedly was told had mitral valve issues and regurgitation for years. Pt had done well until recently when in [place] had an event where she "passed out' and was found to have what she thought was a low heart rate and pressure. She also feels some "electrical" pulse on occasion in her chest up towards the neck area and a "buzziness". She has no appreciable DOE or fatigue. She has had COVID three times and feels has had lower heart rates and BP since. She has had breast cancer sp lumpectomy, aggressive chemo and RT therapy. She also has a hypercoagulable state inherited where she need to be on heparin shots during her pregnancy. 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 She has had a R and L cath without PHTN nor CAD. Pt presents for surgical consideration   Dr. Leafy Ro reviewed the patient's diagnostic studies and determined she would benefit from surgical intervention. He reviewed the treatment options as well as the risks and benefits of surgery. Ms. Duwe was agreeable to proceed with surgery.  Hospital Course: Ms. Oyen arrived at [facility] and was brought to the operating room on 07/09/23. She underwent mitral valve repair utilizing a 38mm Simulus Semi rigid band and an edge to edge repair. She tolerated the procedure  well and was transferred to the SICU in stable condition.   Postoperative hospital course:  The patient was weaned from the ventilator using standard post cardiac surgical protocols without difficulty.  A PCCM consultation was obtained to assist with ICU management.  She initially did require inotropic support and she did have notable postoperative vasoplegia.  Inotropes and vasopressors were weaned over time but she did require initiation of midodrine.  She has expected postoperative  volume overload related to surgical procedure and has required diuresis.  She has an expected acute blood loss anemia which is being monitored clinically and not at the transfusion threshold.  Renal function has remained within normal limits. She continued to require levophed for BP support, this was weaned off on ***. Arterial line was removed without complication on POD3. Routine diuresis was started.   Consults: {consultation:18241}  Significant Diagnostic Studies: TRANSESOPHOGEAL ECHO REPORT       Patient Name:   Tracy Dyer Date of Exam: 05/14/2023  Medical Rec #:  [id number]    Height:       66.0 in  Accession #:    [id number]   Weight:       149.0 lb  Date of Birth:  07-Mar-1971   BSA:          1.765 m  Patient Age:    51 years     BP:           115/44 mmHg  Patient Gender: F            HR:           67 bpm.  Exam Location:  Inpatient   Procedure: Transesophageal Echo, 3D Echo, Color Doppler and Cardiac  Doppler   Indications:    Mitral Regurgitation i34.0    History:         Patient has prior history of Echocardiogram examinations,  most                  recent 05/05/2023.    Sonographer:     Irving Burton Senior RDCS  Referring Phys:  [id number] Alben Spittle BRANCH  Diagnosing Phys: Mary Branch   PROCEDURE: After discussion of the risks and benefits of a TEE, an  informed consent was obtained from the patient. The transesophogeal probe  was passed without difficulty through the esophogus of the patient.  Sedation performed by different physician.  The patient was monitored while under deep sedation. Anesthestetic  sedation was provided intravenously by Anesthesiology: 240mg  of Propofol,  60mg  of Lidocaine. The patient developed no complications during the  procedure.  Transgastric views not obtained.    IMPRESSIONS     1. Left ventricular ejection fraction, by estimation, is 60 to 65%. The  left ventricle has normal function.   2. Right ventricular systolic function is normal. The  right ventricular  size is normal.   3. No left atrial/left atrial appendage thrombus was detected.   4. There are multiple jets, EROA may be underestimating the severity of  regurgitation. 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   5. The aortic valve is normal in structure. Aortic valve regurgitation is  not visualized.   Conclusion(s)/Recommendation(s): Referral sent to Dr. Leafy Ro.   FINDINGS   Left Ventricle: Left ventricular ejection fraction, by estimation, is 60  to 65%. The left ventricle has normal function. The left ventricular  internal cavity size was normal in size.  Right Ventricle: The right ventricular size is normal. Right ventricular  systolic function is normal.   Left Atrium: No left atrial/left atrial appendage thrombus was detected.   Mitral Valve: There are multiple jets, EROA may be underestimating the  severity of regurgitation. 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   Tricuspid Valve: The tricuspid valve is normal in structure. Tricuspid  valve regurgitation is trivial.   Aortic Valve: The aortic valve is normal in structure. Aortic valve  regurgitation is not visualized.   Pulmonic Valve: The pulmonic valve was normal in structure. Pulmonic valve  regurgitation is not visualized.   Aorta: The aortic root and ascending aorta are structurally normal, with  no evidence of dilitation. There is minimal (Grade I) plaque.   IAS/Shunts: No atrial level shunt detected by color flow Doppler.     MR Peak grad:    106.5 mmHg  MR Mean grad:    81.0 mmHg  MR Vmax:         516.00 cm/s  MR Vmean:        435.0 cm/s  MR PISA:         3.08 cm  MR PISA Eff ROA: 23 mm  MR PISA Radius:  0.70 cm   Carolan Clines  Electronically signed by Carolan Clines  Signature  Date/Time: 05/14/2023/9:36:31 AM        Final     RIGHT/LEFT HEART CATH AND CORONARY ANGIOGRAPHY   Conclusion  1.  Normal right dominant coronary circulation. 2.  Fick cardiac output of 6 L/min and Fick cardiac index of 3.4 L/min with the following hemodynamics:             Right atrial pressure mean of 9 mmHg             Right ventricular pressure 27/7 with end-diastolic pressure of 12 mmHg             Wedge pressure mean of 11 mmHg with V waves to 12 mmHg             Pulmonary artery pressure 27/11 with a mean of 18 mmHg             PVR of 1.2 Woods units             PA pulsatility index of 1.8 3.  LVEDP of 14 mmHg:  Treatments: surgery: CARDIOVASCULAR SURGERY OPERATIVE NOTE   07/09/2023 Secilia Murrill [id number]   Surgeon:  Ashley Akin, MD   First Assistant: Aloha Gell [facility]                               An experienced assistant was required given the complexity of this surgery and the standard of surgical care. The assistant was needed for exposure, dissection, suctioning, retraction of delicate tissues and sutures, instrument exchange and for overall help during this procedure.       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 rate 18, height 5\' 6"  (1.676 m), weight 72.9 kg, last menstrual period 09/05/2020, SpO2 99%. {physical [id number]   Discharge Medications:  The  patient has been discharged on:   1.Beta Blocker:  Yes [   ]                              No   [   ]                              If No, reason:  2.Ace Inhibitor/ARB: Yes [   ]                                     No  [    ]                                     If No, reason:  3.Statin:   Yes [   ]                   No  [   ]                  If No, reason:  4.Ecasa:  Yes  [   ]                  No   [   ]                  If No, reason:  Patient had ACS upon admission:  Plavix/P2Y12 inhibitor: Yes [   ]                                      No  [   ]      Allergies as of 07/12/2023       Reactions   Sulfa Antibiotics    Rash, urticarial   Propoxyphene Anxiety, Rash, Other (See Comments)   Sulfamethoxazole Rash   Feel sun burn     Med Rec must be completed prior to using this [facility]***       Follow-up Information     [facility] Follow up on 07/26/2023.   Specialty: Cardiothoracic Surgery Why: Follow up appointment is at 12:30PM Contact information: [address] [place], [address] [place] [address] [phone]        [facility] Follow up on 07/26/2023.   Why: To get chest xray at 11:30AM prior to your appointment Contact information: [address] [place] [address]        Reather Littler D, NP Follow up on 08/02/2023.   Specialty: Cardiology Why: Cardiology appointment is at 9:00AM Contact information: [address] [place] [address] [place] [address] [phone]         [facility] Follow up on 08/20/2023.   Specialty: Cardiology Why: Echocardiogram is at 8:30AM Contact information: [address] [place] [address] [phone]                Signed:  Jenny Reichmann, PA-C  07/12/2023, 9:09 AM

## 2023-07-09 NOTE — Transfer of Care (Signed)
Immediate Anesthesia Transfer of Care Note  Patient: Tracy Dyer  Procedure(s) Performed: MITRAL VALVE REPAIR USING SIMULUS ANNULOPLASTY BAND (Chest) TRANSESOPHAGEAL ECHOCARDIOGRAM  Patient Location: SICU  Anesthesia Type:General  Level of Consciousness: Patient remains intubated per anesthesia plan  Airway & Oxygen Therapy: Patient remains intubated per anesthesia plan  Post-op Assessment: Report given to RN and Post -op Vital signs reviewed and stable  Post vital signs: Reviewed and stable  Last Vitals:  Vitals Value Taken Time  BP 120/80   Temp 36 C 07/09/23 1213  Pulse 86 07/09/23 1213  Resp 16 07/09/23 1213  SpO2 100 % 07/09/23 1213  Vitals shown include unfiled device data.  Last Pain:  Vitals:   07/09/23 0625  TempSrc:   PainSc: 0-No pain         Complications: No notable events documented.

## 2023-07-09 NOTE — Discharge Instructions (Signed)
Discharge Instructions:  1. You may shower, please wash incisions daily with soap and water and keep dry.  If you wish to cover wounds with dressing you may do so but please keep clean and change daily.  No tub baths or swimming until incisions have completely healed.  If your incisions become red or develop any drainage please call our office at [phone]  2. No Driving until cleared by Dr. Weldner's office and you are no longer using narcotic pain medications  3. Monitor your weight daily.. Please use the same scale and weigh at same time... If you gain 5-10 lbs in 48 hours with associated lower extremity swelling, please contact our office at [phone]  4. Fever of 101.5 for at least 24 hours with no source, please contact our office at [phone]  5. Activity- up as tolerated, please walk at least 3 times per day.  Avoid strenuous activity, no lifting, pushing, or pulling with your arms over 8-10 lbs for a minimum of 6 weeks  6. If any questions or concerns arise, please do not hesitate to contact our office at [phone]  

## 2023-07-09 NOTE — Anesthesia Procedure Notes (Signed)
Procedure Name: Intubation Date/Time: 07/09/2023 7:55 AM  Performed by: Darryl Nestle, CRNAPre-anesthesia Checklist: Patient identified, Emergency Drugs available, Suction available and Patient being monitored Patient Re-evaluated:Patient Re-evaluated prior to induction Oxygen Delivery Method: Circle system utilized Preoxygenation: Pre-oxygenation with 100% oxygen Induction Type: IV induction Ventilation: Mask ventilation without difficulty Laryngoscope Size: Mac and 3 Grade View: Grade I Tube type: Oral Tube size: 8.0 mm Number of attempts: 1 Airway Equipment and Method: Stylet and Oral airway Placement Confirmation: ETT inserted through vocal cords under direct vision, positive ETCO2 and breath sounds checked- equal and bilateral Secured at: 22 cm Tube secured with: Tape Dental Injury: Teeth and Oropharynx as per pre-operative assessment

## 2023-07-09 NOTE — Interval H&P Note (Signed)
History and Physical Interval Note:  07/09/2023 6:49 AM  Tracy Dyer  has presented today for surgery, with the diagnosis of SEVERE MR.  The various methods of treatment have been discussed with the patient and family. After consideration of risks, benefits and other options for treatment, the patient has consented to  Procedure(s): MITRAL VALVE REPAIR, possible replacement (N/A) TRANSESOPHAGEAL ECHOCARDIOGRAM (N/A) as a surgical intervention.  The patient's history has been reviewed, patient examined, no change in status, stable for surgery.  I have reviewed the patient's chart and labs.  Questions were answered to the patient's satisfaction.     [name]

## 2023-07-09 NOTE — Anesthesia Procedure Notes (Signed)
Central Venous Catheter Insertion Performed by: Gaynelle Adu, MD, anesthesiologist Start/End12/01/2023 6:50 AM, 07/09/2023 7:10 AM Patient location: Pre-op. Preanesthetic checklist: patient identified, IV checked, site marked, risks and benefits discussed, surgical consent, monitors and equipment checked, pre-op evaluation, timeout performed and anesthesia consent Hand hygiene performed  and maximum sterile barriers used  PA cath was placed.Swan type:thermodilution PA Cath depth:50 Procedure performed without using ultrasound guided technique. Attempts: 1 Patient tolerated the procedure well with no immediate complications.

## 2023-07-09 NOTE — Plan of Care (Signed)
  Problem: Education: Goal: Knowledge of General Education information will improve Description: Including pain rating scale, medication(s)/side effects and non-pharmacologic comfort measures Outcome: Progressing   Problem: Health Behavior/Discharge Planning: Goal: Ability to manage health-related needs will improve Outcome: Progressing   Problem: Clinical Measurements: Goal: Ability to maintain clinical measurements within normal limits will improve Outcome: Progressing Goal: Will remain free from infection Outcome: Progressing Goal: Diagnostic test results will improve Outcome: Progressing Goal: Respiratory complications will improve Outcome: Progressing Goal: Cardiovascular complication will be avoided Outcome: Progressing   Problem: Activity: Goal: Risk for activity intolerance will decrease Outcome: Progressing   Problem: Nutrition: Goal: Adequate nutrition will be maintained Outcome: Progressing   Problem: Coping: Goal: Level of anxiety will decrease Outcome: Progressing   Problem: Elimination: Goal: Will not experience complications related to bowel motility Outcome: Progressing Goal: Will not experience complications related to urinary retention Outcome: Progressing   Problem: Pain Management: Goal: General experience of comfort will improve Outcome: Progressing   Problem: Safety: Goal: Ability to remain free from injury will improve Outcome: Progressing   Problem: Skin Integrity: Goal: Risk for impaired skin integrity will decrease Outcome: Progressing   Problem: Education: Goal: Will demonstrate proper wound care and an understanding of methods to prevent future damage Outcome: Progressing Goal: Knowledge of disease or condition will improve Outcome: Progressing Goal: Knowledge of the prescribed therapeutic regimen will improve Outcome: Progressing Goal: Individualized Educational Video(s) Outcome: Progressing   Problem: Activity: Goal: Risk for  activity intolerance will decrease Outcome: Progressing   Problem: Cardiac: Goal: Will achieve and/or maintain hemodynamic stability Outcome: Progressing   Problem: Clinical Measurements: Goal: Postoperative complications will be avoided or minimized Outcome: Progressing   Problem: Respiratory: Goal: Respiratory status will improve Outcome: Progressing   Problem: Skin Integrity: Goal: Wound healing without signs and symptoms of infection Outcome: Progressing Goal: Risk for impaired skin integrity will decrease Outcome: Progressing   Problem: Urinary Elimination: Goal: Ability to achieve and maintain adequate renal perfusion and functioning will improve Outcome: Progressing

## 2023-07-10 ENCOUNTER — Inpatient Hospital Stay (HOSPITAL_COMMUNITY): Payer: BC Managed Care – PPO

## 2023-07-10 DIAGNOSIS — Z9889 Other specified postprocedural states: Secondary | ICD-10-CM | POA: Diagnosis not present

## 2023-07-10 DIAGNOSIS — I340 Nonrheumatic mitral (valve) insufficiency: Secondary | ICD-10-CM

## 2023-07-10 LAB — CBC
HCT: 25.7 % — ABNORMAL LOW (ref 36.0–46.0)
HCT: 28.2 % — ABNORMAL LOW (ref 36.0–46.0)
Hemoglobin: 9.2 g/dL — ABNORMAL LOW (ref 12.0–15.0)
Hemoglobin: 9.7 g/dL — ABNORMAL LOW (ref 12.0–15.0)
MCH: 32.6 pg (ref 26.0–34.0)
MCH: 31.7 pg (ref 26.0–34.0)
MCHC: 35.8 g/dL (ref 30.0–36.0)
MCHC: 34.4 g/dL (ref 30.0–36.0)
MCV: 91.1 fL (ref 80.0–100.0)
MCV: 92.2 fL (ref 80.0–100.0)
Platelets: 140 10*3/uL — ABNORMAL LOW (ref 150–400)
Platelets: 129 10*3/uL — ABNORMAL LOW (ref 150–400)
RBC: 2.82 MIL/uL — ABNORMAL LOW (ref 3.87–5.11)
RBC: 3.06 MIL/uL — ABNORMAL LOW (ref 3.87–5.11)
RDW: 13.3 % (ref 11.5–15.5)
RDW: 13.8 % (ref 11.5–15.5)
WBC: 7.5 10*3/uL (ref 4.0–10.5)
WBC: 8 10*3/uL (ref 4.0–10.5)
nRBC: 0 % (ref 0.0–0.2)
nRBC: 0 % (ref 0.0–0.2)

## 2023-07-10 LAB — BASIC METABOLIC PANEL
Anion gap: 7 (ref 5–15)
Anion gap: 7 (ref 5–15)
BUN: 6 mg/dL (ref 6–20)
BUN: 8 mg/dL (ref 6–20)
CO2: 22 mmol/L (ref 22–32)
CO2: 23 mmol/L (ref 22–32)
Calcium: 7.4 mg/dL — ABNORMAL LOW (ref 8.9–10.3)
Calcium: 7.9 mg/dL — ABNORMAL LOW (ref 8.9–10.3)
Chloride: 108 mmol/L (ref 98–111)
Chloride: 107 mmol/L (ref 98–111)
Creatinine, Ser: 0.6 mg/dL (ref 0.44–1.00)
Creatinine, Ser: 0.75 mg/dL (ref 0.44–1.00)
GFR, Estimated: >60 mL/min (ref >60)
GFR, Estimated: >60 mL/min (ref >60)
Glucose, Bld: 122 mg/dL — ABNORMAL HIGH (ref 70–99)
Glucose, Bld: 129 mg/dL — ABNORMAL HIGH (ref 70–99)
Potassium: 3.5 mmol/L (ref 3.5–5.1)
Potassium: 4 mmol/L (ref 3.5–5.1)
Sodium: 137 mmol/L (ref 135–145)
Sodium: 137 mmol/L (ref 135–145)

## 2023-07-10 LAB — MAGNESIUM
Magnesium: 2.7 mg/dL — ABNORMAL HIGH (ref 1.7–2.4)
Magnesium: 2.4 mg/dL (ref 1.7–2.4)

## 2023-07-10 LAB — GLUCOSE, CAPILLARY
Glucose-Capillary: 132 mg/dL — ABNORMAL HIGH (ref 70–99)
Glucose-Capillary: 135 mg/dL — ABNORMAL HIGH (ref 70–99)
Glucose-Capillary: 78 mg/dL (ref 70–99)
Glucose-Capillary: 144 mg/dL — ABNORMAL HIGH (ref 70–99)
Glucose-Capillary: 113 mg/dL — ABNORMAL HIGH (ref 70–99)
Glucose-Capillary: 130 mg/dL — ABNORMAL HIGH (ref 70–99)
Glucose-Capillary: 120 mg/dL — ABNORMAL HIGH (ref 70–99)
Glucose-Capillary: 128 mg/dL — ABNORMAL HIGH (ref 70–99)
Glucose-Capillary: 117 mg/dL — ABNORMAL HIGH (ref 70–99)

## 2023-07-10 MED ORDER — KETOROLAC TROMETHAMINE 15 MG/ML IJ SOLN
15.0000 mg | Freq: Four times a day (QID) | INTRAMUSCULAR | Status: DC
  Administered 2023-07-10 – 2023-07-13 (×13): 15 mg via INTRAVENOUS
  Filled 2023-07-10 (×13): qty 1

## 2023-07-10 MED ORDER — ORAL CARE MOUTH RINSE: 15.0000 mL | OROMUCOSAL | Status: DC | PRN

## 2023-07-10 MED ORDER — ENOXAPARIN SODIUM 40 MG/0.4ML IJ SOSY
40.0000 mg | PREFILLED_SYRINGE | Freq: Every day | INTRAMUSCULAR | Status: DC
  Administered 2023-07-10 – 2023-07-14 (×5): 40 mg via SUBCUTANEOUS
  Filled 2023-07-10 (×5): qty 0.4

## 2023-07-10 MED ORDER — POTASSIUM CHLORIDE 10 MEQ/50ML IV SOLN
INTRAVENOUS | Status: AC
  Administered 2023-07-10: 10 meq via INTRAVENOUS
  Filled 2023-07-10: qty 50

## 2023-07-10 MED ORDER — KETOROLAC TROMETHAMINE 30 MG/ML IJ SOLN
30.0000 mg | Freq: Once | INTRAMUSCULAR | Status: AC
  Administered 2023-07-10: 30 mg via INTRAVENOUS
  Filled 2023-07-10: qty 1

## 2023-07-10 MED ORDER — POTASSIUM CHLORIDE 10 MEQ/50ML IV SOLN
10.0000 meq | INTRAVENOUS | Status: AC
  Administered 2023-07-10 (×2): 10 meq via INTRAVENOUS
  Filled 2023-07-10 (×2): qty 50

## 2023-07-10 MED ORDER — INSULIN ASPART 100 UNIT/ML IJ SOLN
0.0000 [IU] | INTRAMUSCULAR | Status: DC
  Administered 2023-07-10 – 2023-07-12 (×4): 2 [IU] via SUBCUTANEOUS

## 2023-07-10 NOTE — Consult Note (Signed)
NAME:  Miaja Norcott, MRN:  [id number], DOB:  1971-04-01, LOS: 1 ADMISSION DATE:  07/09/2023, CONSULTATION DATE:  07/09/2023 REFERRING MD: Dr. Leafy Ro, CHIEF COMPLAINT: Medical management  History of Present Illness:  Katyra Meckes is a 52 year old female with a past medical history significant for breast cancer s/p lumpectomy treated with chemo and radiation, anxiety and depression who presented 12/6 for elective repair of mitral valve stenosis per Dr. Leafy Ro.  Pertinent  Medical History  Breast cancer s/p lumpectomy treated with chemo and radiation Anxiety Depression  Significant Hospital Events: Including procedures, antibiotic start and stop dates in addition to other pertinent events   12/6 presented for elective mitral valve repair per Dr. Leafy Ro  Interim History / Subjective:   Doing well. Less nauseous   Objective   Blood pressure (!) 97/59, pulse 79, temperature 99.5 F (37.5 C), resp. rate 18, height 5\' 6"  (1.676 m), weight 72 kg, last menstrual period 09/05/2020, SpO2 98%. PAP: (16-33)/(7-21) 29/17 CVP:  [7 mmHg-19 mmHg] 13 mmHg CO:  [3.8 L/min-6.2 L/min] 5.6 L/min CI:  [2.2 L/min/m2-3.5 L/min/m2] 3.2 L/min/m2  Vent Mode: PSV;CPAP FiO2 (%):  [40 %-50 %] 40 % Set Rate:  [4 bmp-16 bmp] 4 bmp Vt Set:  [570 mL] 570 mL PEEP:  [5 cmH20] 5 cmH20 Pressure Support:  [10 cmH20] 10 cmH20   Intake/Output Summary (Last 24 hours) at 07/10/2023 0905 Last data filed at 07/10/2023 0800 Gross per 24 hour  Intake 5669.67 ml  Output 3545 ml  Net 2124.67 ml   Filed Weights   07/09/23 0613 07/10/23 0500  Weight: 65.8 kg 72 kg    Examination: General: doing well, extubated  HEENT: NCAT, tracking  Neuro: alert following commands  CV: RRR s1 s2 PULM:  clear no wheeze  GI: soft nt nd  Extremities: no edema  Skin: no rash   Resolved Hospital Problem list     Assessment & Plan:   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  P: Weaning from  pressors  Doing well extubated  Lines and tubes remain in place   Expected post op vent management, resolved  - doing well extubated   Anxiety and depression -Home medications include Klonopin P:  Holding home meds at this time  Once taking po can start   Best Practice (right click and "Reselect all SmartList Selections" daily)   Diet/type: NPO DVT prophylaxis: enoxaparin (LOVENOX) injection 40 mg Start: 07/10/23 2200 SCDs Start: 07/10/23 0746 SCDs Start: 07/09/23 1128 Pressure ulcer(s): not present on admission  GI prophylaxis: PPI Lines: Central line Foley:  Yes, and it is still needed Code Status:  full code Last date of multidisciplinary goals of care discussion: Per TCTS   Labs   CBC: Recent Labs  Lab 07/07/23 1157 07/09/23 0753 07/09/23 0949 07/09/23 0950 07/09/23 1142 07/09/23 1152 07/09/23 1300 07/09/23 1407 07/09/23 1754 07/10/23 0417  WBC 4.8  --   --   --  10.7*  --   --   --  8.6 7.5  HGB 14.3   < > 8.4*   < > 10.0* 9.5* 8.2* 8.5* 10.0* 9.2*  HCT 41.6   < > 24.1*   < > 29.0* 28.0* 24.0* 25.0* 28.5* 25.7*  MCV 90.6  --   --   --  90.6  --   --   --  90.2 91.1  PLT 188  --  119*  --  111*  --   --   --  129* 140*   < > =  values in this interval not displayed.    Basic Metabolic Panel: Recent Labs  Lab 07/07/23 1157 07/09/23 0753 07/09/23 0856 07/09/23 0859 07/09/23 0950 07/09/23 1025 07/09/23 1028 07/09/23 1152 07/09/23 1300 07/09/23 1407 07/09/23 1754 07/10/23 0417  NA 135   < > 142   < > 139   < > 136 140 143 141 137 137  K 4.0   < > 4.3   < > 4.7   < > 4.2 3.8 3.9 3.9 3.8 3.5  CL 103   < > 100  --  104  --  103  --   --   --  110 108  CO2 24  --   --   --   --   --   --   --   --   --  22 22  GLUCOSE 87   < > 87  --  149*  --  199*  --   --   --  140* 122*  BUN 10   < > 8  --  9  --  9  --   --   --  9 6  CREATININE 0.80   < > 0.50  --  0.50  --  0.50  --   --   --  0.81 0.60  CALCIUM 9.2  --   --   --   --   --   --   --   --   --   7.1* 7.4*  MG  --   --   --   --   --   --   --   --   --   --  2.9* 2.7*   < > = values in this interval not displayed.   GFR: Estimated Creatinine Clearance: 82.7 mL/min (by C-G formula based on SCr of 0.6 mg/dL). Recent Labs  Lab 07/07/23 1157 07/09/23 1142 07/09/23 1754 07/10/23 0417  WBC 4.8 10.7* 8.6 7.5    Liver Function Tests: Recent Labs  Lab 07/07/23 1157  AST 33  ALT 34  ALKPHOS 67  BILITOT 0.8  PROT 6.5  ALBUMIN 3.9   No results for input(s): "LIPASE", "AMYLASE" in the last 168 hours. No results for input(s): "AMMONIA" in the last 168 hours.  ABG    Component Value Date/Time   PHART 7.371 07/09/2023 1407   PCO2ART 41.8 07/09/2023 1407   PO2ART 158 (H) 07/09/2023 1407   HCO3 24.4 07/09/2023 1407   TCO2 26 07/09/2023 1407   ACIDBASEDEF 1.0 07/09/2023 1407   O2SAT 99 07/09/2023 1407     Coagulation Profile: Recent Labs  Lab 07/07/23 1157 07/09/23 1142  INR 1.0 1.4*    Cardiac Enzymes: No results for input(s): "CKTOTAL", "CKMB", "CKMBINDEX", "TROPONINI" in the last 168 hours.  HbA1C: Hgb A1c MFr Bld  Date/Time Value Ref Range Status  07/07/2023 11:57 AM 4.7 (L) 4.8 - 5.6 % Final    Comment:    (NOTE) Pre diabetes:          5.7%-6.4%  Diabetes:              >6.4%  Glycemic control for   <7.0% adults with diabetes     CBG: Recent Labs  Lab 07/10/23 0233 07/10/23 0421 07/10/23 0534 07/10/23 0655 07/10/23 0807  GLUCAP 78 144* 130* 113* 120*    Review of Systems:   Unable to assess  Past Medical History:  She,  has a past medical history of Anxiety, Arthritis,  Blood dyscrasia, Cancer (HCC), Depression, Family history of breast cancer, Family history of lung cancer, Family history of ovarian cancer, GERD (gastroesophageal reflux disease), Heart murmur, History of radiation therapy, Pneumonia, Seasonal allergies, and Stroke (HCC) (1990).   Surgical History:   Past Surgical History:  Procedure Laterality Date   BREAST BIOPSY Left  2019   x 2 biopsy   BREAST EXCISIONAL BIOPSY Left    BREAST LUMPECTOMY WITH RADIOACTIVE SEED AND SENTINEL LYMPH NODE BIOPSY Left 02/26/2021   Procedure: LEFT BREAST LUMPECTOMY WITH RADIOACTIVE SEED x2; LEFT AXILLARY SENTINEL NODE BIOPSY;  Surgeon: Emelia Loron, MD;  Location: [facility];  Service: General;  Laterality: Left;   BREAST LUMPECTOMY WITH RADIOFREQUENCY TAG IDENTIFICATION Left 04/18/2021   Procedure: LEFT BREAST MRI WIRE GUIDED EXCISION;  Surgeon: Emelia Loron, MD;  Location: [facility];  Service: General;  Laterality: Left;   BREAST REDUCTION SURGERY Bilateral 04/18/2021   Procedure: MAMMARY REDUCTION  (BREAST) LEFT ONCOPLASTIC BREAST RECONSTRUCTION, RIGHT BREAST REDUCTION;  Surgeon: Glenna Fellows, MD;  Location: [facility];  Service: Plastics;  Laterality: Bilateral;   CHOLECYSTECTOMY  2008   PORT-A-CATH REMOVAL Right 04/18/2021   Procedure: REMOVAL PORT-A-CATH;  Surgeon: Emelia Loron, MD;  Location: [facility];  Service: General;  Laterality: Right;   PORTACATH PLACEMENT N/A 09/05/2020   Procedure: INSERTION PORT-A-CATH;  Surgeon: Emelia Loron, MD;  Location: [facility];  Service: General;  Laterality: N/A;  PEC BLOCK   RIGHT/LEFT HEART CATH AND CORONARY ANGIOGRAPHY N/A 05/28/2023   Procedure: RIGHT/LEFT HEART CATH AND CORONARY ANGIOGRAPHY;  Surgeon: [name], MD;  Location: [facility]AS[facility];  Service: Cardiovascular;  Laterality: N/A;   TEE WITHOUT CARDIOVERSION N/A 05/14/2023   Procedure: TRANSESOPHAGEAL ECHOCARDIOGRAM;  Surgeon: Maisie Fus, MD;  Location: [facility] INVASIVE CV LAB;  Service: Cardiovascular;  Laterality: N/A;   WISDOM TOOTH EXTRACTION       Social History:   reports that she has never smoked. She has never used smokeless tobacco. She reports that she does not currently use alcohol. She reports that she does not use drugs.   Family History:  Her family history includes Allergic  rhinitis in her brother; Breast cancer in her mother; Cancer in her maternal aunt, maternal uncle, and paternal great-grandmother; Eczema in her father; Heart Problems in her paternal grandfather; Lung cancer (age of onset: 71) in her maternal grandfather; Melanoma in her cousin; Ovarian cancer in her paternal great-grandmother.   Allergies Allergies  Allergen Reactions   Sulfa Antibiotics     Rash, urticarial   Propoxyphene Anxiety, Rash and Other (See Comments)   Sulfamethoxazole Rash    Feel sun burn     Home Medications  Prior to Admission medications   Medication Sig Start Date End Date Taking? Authorizing Provider  clonazePAM (KLONOPIN) 1 MG tablet Take 1 tablet (1 mg total) by mouth 2 (two) times daily as needed for anxiety. 05/28/23  Yes   Collagen-Vitamin C-Biotin (COLLAGEN PO) Take 1 Scoop by mouth daily.   Yes [provider]  Creatine POWD Take 1 Scoop by mouth daily.   Yes [provider]  estradiol (ESTRACE VAGINAL) 0.1 MG/GM vaginal cream Place 1 Applicatorful vaginally 3 (three) times a week. 05/05/23  Yes Causey, Larna Daughters, NP  fexofenadine (ALLEGRA) 180 MG tablet Take 180 mg by mouth daily as needed for allergies or rhinitis.   Yes [provider]  MAGNESIUM CITRATE PO Take 405 mg by mouth daily.   Yes [provider]  Misc Natural Products (JOINT SUPPORT COMPLEX PO) Take 4 capsules by mouth daily.   Yes [provider]  Omega-3 Fatty Acids (FISH OIL PO) Take 813 mg by mouth daily.   Yes [provider]  sertraline (ZOLOFT) 100 MG tablet Take one tablet (100 mg dose) by mouth at bedtime. 03/12/23  Yes   Vitamin D-Vitamin K (VITAMIN D2 + K1 PO) Take 2 drops by mouth daily. Liquid   Yes [provider]  Black Cohosh 540 MG CAPS Take 540 mg by mouth daily.    [provider]  clonazePAM (KLONOPIN) 1 MG tablet Take 1 tablet (1 mg total) by mouth 2 (two) times daily as needed. 03/12/23     Prenatal  Vit-Fe Fumarate-FA (PRENATAL PO) Take 3 capsules by mouth daily.    [provider]  tirzepatide (ZEPBOUND) 15 MG/0.5ML Pen Inject 15 mg into the skin once a week. 05/19/23     tirzepatide (ZEPBOUND) 15 MG/0.5ML Pen Inject 15 mg into the skin every 7 (seven) days. Patient not taking: Reported on 07/06/2023 05/27/23        Josephine Igo, DO [facility] 07/10/2023 9:06 AM

## 2023-07-10 NOTE — Progress Notes (Signed)
[address]       [place] [address]             [phone]      1 Day Post-Op  Procedure(s) (LRB): MITRAL VALVE REPAIR USING SIMULUS ANNULOPLASTY BAND (N/A) TRANSESOPHAGEAL ECHOCARDIOGRAM (N/A)   Total Length of Stay:  LOS: 1 day    SUBJECTIVE: Uncomfortable this am No issues over night  Vitals:   07/10/23 0645 07/10/23 0700  BP:    Pulse: 78 79  Resp: 14 18  Temp: 99.3 F (37.4 C) 99.5 F (37.5 C)  SpO2: 98% 98%    Intake/Output      12/06 0701 12/07 0700 12/07 0701 12/08 0700   I.V. (mL/kg) 3047.5 (42.3)    Blood 225    NG/GT 80    IV Piggyback 2205.9    Total Intake(mL/kg) 5558.4 (77.2)    Urine (mL/kg/hr) 2655 (1.5)    Emesis/NG output 50    Blood 650    Chest Tube 440    Total Output 3795    Net +1763.4             sodium chloride 10 mL/hr at 07/10/23 0700   sodium chloride     sodium chloride 10 mL/hr at 07/10/23 0700   albumin human 5 mL/hr at 07/10/23 0700    ceFAZolin (ANCEF) IV Stopped (07/10/23 0607)   dexmedetomidine (PRECEDEX) IV infusion Stopped (07/09/23 1225)   insulin 0.3 Units/hr (07/10/23 0700)   lactated ringers     lactated ringers 20 mL/hr at 07/10/23 0700   nitroGLYCERIN     norepinephrine (LEVOPHED) Adult infusion 11 mcg/min (07/10/23 0700)   phenylephrine (NEO-SYNEPHRINE) Adult infusion     potassium chloride 10 mEq (07/10/23 0730)    CBC    Component Value Date/Time   WBC 7.5 07/10/2023 0417   RBC 2.82 (L) 07/10/2023 0417   HGB 9.2 (L) 07/10/2023 0417   HGB 13.5 06/08/2023 1331   HGB 14.7 05/07/2023 1026   HCT 25.7 (L) 07/10/2023 0417   HCT 43.9 05/07/2023 1026   PLT 140 (L) 07/10/2023 0417   PLT 177 06/08/2023 1331   PLT 234 05/07/2023 1026   MCV 91.1 07/10/2023 0417   MCV 95 05/07/2023 1026   MCH 32.6 07/10/2023 0417   MCHC 35.8 07/10/2023 0417   RDW 13.3 07/10/2023 0417   RDW 13.0 05/07/2023 1026   LYMPHSABS 1.6 06/08/2023 1331   MONOABS 0.4 06/08/2023 1331   EOSABS 0.1  06/08/2023 1331   BASOSABS 0.0 06/08/2023 1331   CMP     Component Value Date/Time   NA 137 07/10/2023 0417   NA 139 05/07/2023 1026   K 3.5 07/10/2023 0417   CL 108 07/10/2023 0417   CO2 22 07/10/2023 0417   GLUCOSE 122 (H) 07/10/2023 0417   BUN 6 07/10/2023 0417   BUN 11 05/07/2023 1026   CREATININE 0.60 07/10/2023 0417   CREATININE 0.75 06/08/2023 1331   CALCIUM 7.4 (L) 07/10/2023 0417   PROT 6.5 07/07/2023 1157   ALBUMIN 3.9 07/07/2023 1157   AST 33 07/07/2023 1157   AST 25 06/08/2023 1331   ALT 34 07/07/2023 1157   ALT 29 06/08/2023 1331   ALKPHOS 67 07/07/2023 1157   BILITOT 0.8 07/07/2023 1157   BILITOT 0.5 06/08/2023 1331   GFRNONAA >60 07/10/2023 0417   GFRNONAA >60 06/08/2023 1331   ABG    Component Value Date/Time   PHART 7.371 07/09/2023 1407   PCO2ART  41.8 07/09/2023 1407   PO2ART 158 (H) 07/09/2023 1407   HCO3 24.4 07/09/2023 1407   TCO2 26 07/09/2023 1407   ACIDBASEDEF 1.0 07/09/2023 1407   O2SAT 99 07/09/2023 1407   CBG (last 3)  Recent Labs    07/10/23 0421 07/10/23 0534 07/10/23 0655  GLUCAP 144* 130* 113*  EXAM Lungs: clear Card: RR no murmur Ext: warm Neuro: intact   ASSESSMENT: POD #1 sp MV repair Hemodynamics with low SVR issues. Will continue to wean levo for SBP > 100 DC swan later today Hold lasix today Start amiodarone prophylaxis ASA for valve unless afib issues Leave CT   [name], MD 07/10/2023

## 2023-07-11 ENCOUNTER — Inpatient Hospital Stay (HOSPITAL_COMMUNITY): Payer: BC Managed Care – PPO

## 2023-07-11 DIAGNOSIS — I340 Nonrheumatic mitral (valve) insufficiency: Secondary | ICD-10-CM | POA: Diagnosis not present

## 2023-07-11 DIAGNOSIS — Z9889 Other specified postprocedural states: Secondary | ICD-10-CM | POA: Diagnosis not present

## 2023-07-11 LAB — BASIC METABOLIC PANEL
Anion gap: 6 (ref 5–15)
BUN: 10 mg/dL (ref 6–20)
CO2: 23 mmol/L (ref 22–32)
Calcium: 7.6 mg/dL — ABNORMAL LOW (ref 8.9–10.3)
Chloride: 106 mmol/L (ref 98–111)
Creatinine, Ser: 0.62 mg/dL (ref 0.44–1.00)
GFR, Estimated: >60 mL/min (ref >60)
Glucose, Bld: 102 mg/dL — ABNORMAL HIGH (ref 70–99)
Potassium: 3.9 mmol/L (ref 3.5–5.1)
Sodium: 135 mmol/L (ref 135–145)

## 2023-07-11 LAB — CBC
HCT: 25.2 % — ABNORMAL LOW (ref 36.0–46.0)
Hemoglobin: 8.8 g/dL — ABNORMAL LOW (ref 12.0–15.0)
MCH: 32.4 pg (ref 26.0–34.0)
MCHC: 34.9 g/dL (ref 30.0–36.0)
MCV: 92.6 fL (ref 80.0–100.0)
Platelets: 107 10*3/uL — ABNORMAL LOW (ref 150–400)
RBC: 2.72 MIL/uL — ABNORMAL LOW (ref 3.87–5.11)
RDW: 13.9 % (ref 11.5–15.5)
WBC: 5.7 10*3/uL (ref 4.0–10.5)
nRBC: 0 % (ref 0.0–0.2)

## 2023-07-11 LAB — GLUCOSE, CAPILLARY
Glucose-Capillary: 105 mg/dL — ABNORMAL HIGH (ref 70–99)
Glucose-Capillary: 106 mg/dL — ABNORMAL HIGH (ref 70–99)
Glucose-Capillary: 109 mg/dL — ABNORMAL HIGH (ref 70–99)
Glucose-Capillary: 109 mg/dL — ABNORMAL HIGH (ref 70–99)
Glucose-Capillary: 109 mg/dL — ABNORMAL HIGH (ref 70–99)
Glucose-Capillary: 113 mg/dL — ABNORMAL HIGH (ref 70–99)
Glucose-Capillary: 124 mg/dL — ABNORMAL HIGH (ref 70–99)
Glucose-Capillary: 96 mg/dL (ref 70–99)

## 2023-07-11 MED ORDER — FUROSEMIDE 10 MG/ML IJ SOLN
40.0000 mg | Freq: Once | INTRAMUSCULAR | Status: AC
  Administered 2023-07-11: 40 mg via INTRAVENOUS
  Filled 2023-07-11: qty 4

## 2023-07-11 MED ORDER — MIDODRINE HCL 5 MG PO TABS
10.0000 mg | ORAL_TABLET | Freq: Three times a day (TID) | ORAL | Status: DC
  Administered 2023-07-11 – 2023-07-13 (×8): 10 mg via ORAL
  Filled 2023-07-11 (×8): qty 2

## 2023-07-11 MED ORDER — NOREPINEPHRINE 4 MG/250ML-% IV SOLN
0.0000 ug/min | INTRAVENOUS | Status: DC
  Administered 2023-07-11: 3 ug/min via INTRAVENOUS
  Administered 2023-07-12: 2 ug/min via INTRAVENOUS
  Administered 2023-07-12: 10 ug/min via INTRAVENOUS
  Filled 2023-07-11 (×4): qty 250

## 2023-07-11 NOTE — Progress Notes (Signed)
[address]       [place] [address]             [phone]      2 Days Post-Op  Procedure(s) (LRB): MITRAL VALVE REPAIR USING SIMULUS ANNULOPLASTY BAND (N/A) TRANSESOPHAGEAL ECHOCARDIOGRAM (N/A)   Total Length of Stay:  LOS: 2 days    SUBJECTIVE: Was up and ambulated Has needed increased Levo this am  Vitals:   07/11/23 0645 07/11/23 0700  BP:  (!) 85/56  Pulse: 63 61  Resp: 15 12  Temp:    SpO2: 97% 96%    Intake/Output      12/07 0701 12/08 0700 12/08 0701 12/09 0700   P.O. 480    I.V. (mL/kg) 841.9 (11.3)    Blood     NG/GT     IV Piggyback 464.8    Total Intake(mL/kg) 1786.7 (24.1)    Urine (mL/kg/hr) 1215 (0.7)    Emesis/NG output     Blood     Chest Tube 290    Total Output 1505    Net +281.7             albumin human Stopped (07/10/23 1900)    CBC    Component Value Date/Time   WBC 5.7 07/11/2023 0417   RBC 2.72 (L) 07/11/2023 0417   HGB 8.8 (L) 07/11/2023 0417   HGB 13.5 06/08/2023 1331   HGB 14.7 05/07/2023 1026   HCT 25.2 (L) 07/11/2023 0417   HCT 43.9 05/07/2023 1026   PLT 107 (L) 07/11/2023 0417   PLT 177 06/08/2023 1331   PLT 234 05/07/2023 1026   MCV 92.6 07/11/2023 0417   MCV 95 05/07/2023 1026   MCH 32.4 07/11/2023 0417   MCHC 34.9 07/11/2023 0417   RDW 13.9 07/11/2023 0417   RDW 13.0 05/07/2023 1026   LYMPHSABS 1.6 06/08/2023 1331   MONOABS 0.4 06/08/2023 1331   EOSABS 0.1 06/08/2023 1331   BASOSABS 0.0 06/08/2023 1331   CMP     Component Value Date/Time   NA 135 07/11/2023 0417   NA 139 05/07/2023 1026   K 3.9 07/11/2023 0417   CL 106 07/11/2023 0417   CO2 23 07/11/2023 0417   GLUCOSE 102 (H) 07/11/2023 0417   BUN 10 07/11/2023 0417   BUN 11 05/07/2023 1026   CREATININE 0.62 07/11/2023 0417   CREATININE 0.75 06/08/2023 1331   CALCIUM 7.6 (L) 07/11/2023 0417   PROT 6.5 07/07/2023 1157   ALBUMIN 3.9 07/07/2023 1157   AST 33 07/07/2023 1157   AST 25 06/08/2023 1331   ALT 34  07/07/2023 1157   ALT 29 06/08/2023 1331   ALKPHOS 67 07/07/2023 1157   BILITOT 0.8 07/07/2023 1157   BILITOT 0.5 06/08/2023 1331   GFRNONAA >60 07/11/2023 0417   GFRNONAA >60 06/08/2023 1331   ABG    Component Value Date/Time   PHART 7.371 07/09/2023 1407   PCO2ART 41.8 07/09/2023 1407   PO2ART 158 (H) 07/09/2023 1407   HCO3 24.4 07/09/2023 1407   TCO2 26 07/09/2023 1407   ACIDBASEDEF 1.0 07/09/2023 1407   O2SAT 99 07/09/2023 1407   CBG (last 3)  Recent Labs    07/10/23 1948 07/11/23 0113 07/11/23 0413  GLUCAP 96 109* 109*  EXAM Lungs: overall clear Card: rr no murmur Ext; Warm Neuro: no deficits   ASSESSMENT: POD #2 SP MV 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 Leave aline for now Diurese DC Ct and pws Ambulate  [name], MD 07/11/2023

## 2023-07-11 NOTE — Plan of Care (Signed)
  Problem: Education: Goal: Knowledge of General Education information will improve Description: Including pain rating scale, medication(s)/side effects and non-pharmacologic comfort measures Outcome: Progressing   Problem: Health Behavior/Discharge Planning: Goal: Ability to manage health-related needs will improve Outcome: Progressing   Problem: Clinical Measurements: Goal: Ability to maintain clinical measurements within normal limits will improve Outcome: Progressing Goal: Will remain free from infection Outcome: Progressing Goal: Diagnostic test results will improve Outcome: Progressing Goal: Respiratory complications will improve Outcome: Progressing Goal: Cardiovascular complication will be avoided Outcome: Progressing   Problem: Nutrition: Goal: Adequate nutrition will be maintained Outcome: Progressing   Problem: Coping: Goal: Level of anxiety will decrease Outcome: Progressing   Problem: Elimination: Goal: Will not experience complications related to bowel motility Outcome: Progressing Goal: Will not experience complications related to urinary retention Outcome: Progressing   Problem: Pain Management: Goal: General experience of comfort will improve Outcome: Progressing   Problem: Safety: Goal: Ability to remain free from injury will improve Outcome: Progressing   Problem: Skin Integrity: Goal: Risk for impaired skin integrity will decrease Outcome: Progressing   Problem: Education: Goal: Will demonstrate proper wound care and an understanding of methods to prevent future damage Outcome: Progressing Goal: Knowledge of disease or condition will improve Outcome: Progressing Goal: Knowledge of the prescribed therapeutic regimen will improve Outcome: Progressing Goal: Individualized Educational Video(s) Outcome: Progressing   Problem: Cardiac: Goal: Will achieve and/or maintain hemodynamic stability Outcome: Progressing   Problem: Clinical  Measurements: Goal: Postoperative complications will be avoided or minimized Outcome: Progressing   Problem: Respiratory: Goal: Respiratory status will improve Outcome: Progressing   Problem: Skin Integrity: Goal: Wound healing without signs and symptoms of infection Outcome: Progressing Goal: Risk for impaired skin integrity will decrease Outcome: Progressing   Problem: Urinary Elimination: Goal: Ability to achieve and maintain adequate renal perfusion and functioning will improve Outcome: Progressing

## 2023-07-11 NOTE — Progress Notes (Signed)
Epicardial pacing wires removed at 0924 per order. Pt began bedrest at this time with vitals q15 minutes per protocol.  Pt tolerated procedure well and vital signs remained stable.  Pt ambulated in hall at 1030 after bedrest completed. Pt returned to room and chest tubes removed per order. Pt tolerated procedure well. Vital signs remained stable.

## 2023-07-11 NOTE — Progress Notes (Signed)
NAME:  Tracy Dyer, MRN:  [id number], DOB:  1970/12/17, LOS: 2 ADMISSION DATE:  07/09/2023, CONSULTATION DATE:  07/09/2023 REFERRING MD: Dr. Leafy Ro, CHIEF COMPLAINT: Medical management  History of Present Illness:  Tracy Dyer is a 52 year old female with a past medical history significant for breast cancer s/p lumpectomy treated with chemo and radiation, anxiety and depression who presented 12/6 for elective repair of mitral valve stenosis per Dr. Leafy Ro.  Pertinent  Medical History  Breast cancer s/p lumpectomy treated with chemo and radiation Anxiety Depression  Significant Hospital Events: Including procedures, antibiotic start and stop dates in addition to other pertinent events   12/6 presented for elective mitral valve repair per Dr. Leafy Ro  Interim History / Subjective:   Patient doing well, not nauseous anymore.  Sitting up in chair plans to walk today.  Already took laps this morning  Objective   Blood pressure (!) 98/57, pulse (!) 58, temperature (!) 97.4 F (36.3 C), temperature source Oral, resp. rate 16, height 5\' 6"  (1.676 m), weight 74.2 kg, last menstrual period 09/05/2020, SpO2 96%. PAP: (25-32)/(10-17) 25/10 CO:  [5.4 L/min] 5.4 L/min CI:  [3.08 L/min/m2] 3.08 L/min/m2      Intake/Output Summary (Last 24 hours) at 07/11/2023 4098 Last data filed at 07/11/2023 0900 Gross per 24 hour  Intake 1594.64 ml  Output 1600 ml  Net -5.36 ml   Filed Weights   07/09/23 0613 07/10/23 0500 07/11/23 0500  Weight: 65.8 kg 72 kg 74.2 kg    Examination: General: No acute distress sitting up in chair HEENT: [place] AT tracking appropriately Neuro: Alert oriented following commands CV: Regular rhythm S1-S2 PULM: Clear to auscultation no wheeze GI: Soft nontender nondistended Extremities: No significant edema Skin: No rash  Resolved Hospital Problem list     Assessment & Plan:   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  P: Pain  control  Mobility  Removing lines and tubes today   Expected post op vent management, resolved  - doing well extubated   Anxiety and depression -Home medications include Klonopin P:  Sertraline 100mg  daily   Best Practice (right click and "Reselect all SmartList Selections" daily)   Diet/type: NPO DVT prophylaxis: enoxaparin (LOVENOX) injection 40 mg Start: 07/10/23 2200 SCDs Start: 07/10/23 0746 SCDs Start: 07/09/23 1128 Pressure ulcer(s): not present on admission  GI prophylaxis: PPI Lines: Central line Foley:  Yes, and it is still needed Code Status:  full code Last date of multidisciplinary goals of care discussion: Per TCTS   Labs   CBC: Recent Labs  Lab 07/09/23 1142 07/09/23 1152 07/09/23 1407 07/09/23 1754 07/10/23 0417 07/10/23 1655 07/11/23 0417  WBC 10.7*  --   --  8.6 7.5 8.0 5.7  HGB 10.0*   < > 8.5* 10.0* 9.2* 9.7* 8.8*  HCT 29.0*   < > 25.0* 28.5* 25.7* 28.2* 25.2*  MCV 90.6  --   --  90.2 91.1 92.2 92.6  PLT 111*  --   --  129* 140* 129* 107*   < > = values in this interval not displayed.    Basic Metabolic Panel: Recent Labs  Lab 07/07/23 1157 07/09/23 0753 07/09/23 1028 07/09/23 1152 07/09/23 1407 07/09/23 1754 07/10/23 0417 07/10/23 1655 07/11/23 0417  NA 135   < > 136   < > 141 137 137 137 135  K 4.0   < > 4.2   < > 3.9 3.8 3.5 4.0 3.9  CL 103   < > 103  --   --  110 108 107 106  CO2 24  --   --   --   --  22 22 23 23   GLUCOSE 87   < > 199*  --   --  140* 122* 129* 102*  BUN 10   < > 9  --   --  9 6 8 10   CREATININE 0.80   < > 0.50  --   --  0.81 0.60 0.75 0.62  CALCIUM 9.2  --   --   --   --  7.1* 7.4* 7.9* 7.6*  MG  --   --   --   --   --  2.9* 2.7* 2.4  --    < > = values in this interval not displayed.   GFR: Estimated Creatinine Clearance: 83.8 mL/min (by C-G formula based on SCr of 0.62 mg/dL). Recent Labs  Lab 07/09/23 1754 07/10/23 0417 07/10/23 1655 07/11/23 0417  WBC 8.6 7.5 8.0 5.7    Liver Function  Tests: Recent Labs  Lab 07/07/23 1157  AST 33  ALT 34  ALKPHOS 67  BILITOT 0.8  PROT 6.5  ALBUMIN 3.9   No results for input(s): "LIPASE", "AMYLASE" in the last 168 hours. No results for input(s): "AMMONIA" in the last 168 hours.  ABG    Component Value Date/Time   PHART 7.371 07/09/2023 1407   PCO2ART 41.8 07/09/2023 1407   PO2ART 158 (H) 07/09/2023 1407   HCO3 24.4 07/09/2023 1407   TCO2 26 07/09/2023 1407   ACIDBASEDEF 1.0 07/09/2023 1407   O2SAT 99 07/09/2023 1407     Coagulation Profile: Recent Labs  Lab 07/07/23 1157 07/09/23 1142  INR 1.0 1.4*    Cardiac Enzymes: No results for input(s): "CKTOTAL", "CKMB", "CKMBINDEX", "TROPONINI" in the last 168 hours.  HbA1C: Hgb A1c MFr Bld  Date/Time Value Ref Range Status  07/07/2023 11:57 AM 4.7 (L) 4.8 - 5.6 % Final    Comment:    (NOTE) Pre diabetes:          5.7%-6.4%  Diabetes:              >6.4%  Glycemic control for   <7.0% adults with diabetes     CBG: Recent Labs  Lab 07/10/23 1559 07/10/23 1948 07/11/23 0113 07/11/23 0413 07/11/23 0745  GLUCAP 117* 96 109* 109* 113*    Review of Systems:   Unable to assess  Past Medical History:  She,  has a past medical history of Anxiety, Arthritis, Blood dyscrasia, Cancer (HCC), Depression, Family history of breast cancer, Family history of lung cancer, Family history of ovarian cancer, GERD (gastroesophageal reflux disease), Heart murmur, History of radiation therapy, Pneumonia, Seasonal allergies, and Stroke (HCC) (1990).   Surgical History:   Past Surgical History:  Procedure Laterality Date   BREAST BIOPSY Left 2019   x 2 biopsy   BREAST EXCISIONAL BIOPSY Left    BREAST LUMPECTOMY WITH RADIOACTIVE SEED AND SENTINEL LYMPH NODE BIOPSY Left 02/26/2021   Procedure: LEFT BREAST LUMPECTOMY WITH RADIOACTIVE SEED x2; LEFT AXILLARY SENTINEL NODE BIOPSY;  Surgeon: Emelia Loron, MD;  Location: [facility];  Service: General;  Laterality:  Left;   BREAST LUMPECTOMY WITH RADIOFREQUENCY TAG IDENTIFICATION Left 04/18/2021   Procedure: LEFT BREAST MRI WIRE GUIDED EXCISION;  Surgeon: Emelia Loron, MD;  Location: [facility];  Service: General;  Laterality: Left;   BREAST REDUCTION SURGERY Bilateral 04/18/2021   Procedure: MAMMARY REDUCTION  (BREAST) LEFT ONCOPLASTIC BREAST RECONSTRUCTION, RIGHT BREAST REDUCTION;  Surgeon: Glenna Fellows, MD;  Location: [facility];  Service: Plastics;  Laterality: Bilateral;   CHOLECYSTECTOMY  2008   PORT-A-CATH REMOVAL Right 04/18/2021   Procedure: REMOVAL PORT-A-CATH;  Surgeon: Emelia Loron, MD;  Location: [facility];  Service: General;  Laterality: Right;   PORTACATH PLACEMENT N/A 09/05/2020   Procedure: INSERTION PORT-A-CATH;  Surgeon: Emelia Loron, MD;  Location: [facility];  Service: General;  Laterality: N/A;  PEC BLOCK   RIGHT/LEFT HEART CATH AND CORONARY ANGIOGRAPHY N/A 05/28/2023   Procedure: RIGHT/LEFT HEART CATH AND CORONARY ANGIOGRAPHY;  Surgeon: [name], MD;  Location: [facility];  Service: Cardiovascular;  Laterality: N/A;   TEE WITHOUT CARDIOVERSION N/A 05/14/2023   Procedure: TRANSESOPHAGEAL ECHOCARDIOGRAM;  Surgeon: Maisie Fus, MD;  Location: [facility] INVASIVE CV LAB;  Service: Cardiovascular;  Laterality: N/A;   WISDOM TOOTH EXTRACTION       Social History:   reports that she has never smoked. She has never used smokeless tobacco. She reports that she does not currently use alcohol. She reports that she does not use drugs.   Family History:  Her family history includes Allergic rhinitis in her brother; Breast cancer in her mother; Cancer in her maternal aunt, maternal uncle, and paternal great-grandmother; Eczema in her father; Heart Problems in her paternal grandfather; Lung cancer (age of onset: 28) in her maternal grandfather; Melanoma in her cousin; Ovarian cancer in her paternal great-grandmother.    Allergies Allergies  Allergen Reactions   Sulfa Antibiotics     Rash, urticarial   Propoxyphene Anxiety, Rash and Other (See Comments)   Sulfamethoxazole Rash    Feel sun burn     Home Medications  Prior to Admission medications   Medication Sig Start Date End Date Taking? Authorizing Provider  clonazePAM (KLONOPIN) 1 MG tablet Take 1 tablet (1 mg total) by mouth 2 (two) times daily as needed for anxiety. 05/28/23  Yes   Collagen-Vitamin C-Biotin (COLLAGEN PO) Take 1 Scoop by mouth daily.   Yes [provider]  Creatine POWD Take 1 Scoop by mouth daily.   Yes [provider]  estradiol (ESTRACE VAGINAL) 0.1 MG/GM vaginal cream Place 1 Applicatorful vaginally 3 (three) times a week. 05/05/23  Yes Causey, Larna Daughters, NP  fexofenadine (ALLEGRA) 180 MG tablet Take 180 mg by mouth daily as needed for allergies or rhinitis.   Yes [provider]  MAGNESIUM CITRATE PO Take 405 mg by mouth daily.   Yes [provider]  Misc Natural Products (JOINT SUPPORT COMPLEX PO) Take 4 capsules by mouth daily.   Yes [provider]  Omega-3 Fatty Acids (FISH OIL PO) Take 813 mg by mouth daily.   Yes [provider]  sertraline (ZOLOFT) 100 MG tablet Take one tablet (100 mg dose) by mouth at bedtime. 03/12/23  Yes   Vitamin D-Vitamin K (VITAMIN D2 + K1 PO) Take 2 drops by mouth daily. Liquid   Yes [provider]  Black Cohosh 540 MG CAPS Take 540 mg by mouth daily.    [provider]  clonazePAM (KLONOPIN) 1 MG tablet Take 1 tablet (1 mg total) by mouth 2 (two) times daily as needed. 03/12/23     Prenatal Vit-Fe Fumarate-FA (PRENATAL PO) Take 3 capsules by mouth daily.    [provider]  tirzepatide (ZEPBOUND) 15 MG/0.5ML Pen Inject 15 mg into the skin once a week. 05/19/23     tirzepatide (ZEPBOUND) 15 MG/0.5ML Pen Inject 15 mg into the  skin every 7 (seven) days. Patient not taking: Reported on 07/06/2023 05/27/23         Josephine Igo, DO [facility] 07/11/2023 9:38 AM

## 2023-07-12 ENCOUNTER — Encounter (HOSPITAL_COMMUNITY): Payer: Self-pay | Admitting: Thoracic Surgery (Cardiothoracic Vascular Surgery)

## 2023-07-12 DIAGNOSIS — I959 Hypotension, unspecified: Secondary | ICD-10-CM | POA: Diagnosis not present

## 2023-07-12 DIAGNOSIS — Z9889 Other specified postprocedural states: Secondary | ICD-10-CM | POA: Diagnosis not present

## 2023-07-12 LAB — GLUCOSE, CAPILLARY
Glucose-Capillary: 88 mg/dL (ref 70–99)
Glucose-Capillary: 147 mg/dL — ABNORMAL HIGH (ref 70–99)
Glucose-Capillary: 102 mg/dL — ABNORMAL HIGH (ref 70–99)
Glucose-Capillary: 83 mg/dL (ref 70–99)
Glucose-Capillary: 99 mg/dL (ref 70–99)
Glucose-Capillary: 88 mg/dL (ref 70–99)

## 2023-07-12 LAB — CBC
HCT: 26.9 % — ABNORMAL LOW (ref 36.0–46.0)
Hemoglobin: 9 g/dL — ABNORMAL LOW (ref 12.0–15.0)
MCH: 31.6 pg (ref 26.0–34.0)
MCHC: 33.5 g/dL (ref 30.0–36.0)
MCV: 94.4 fL (ref 80.0–100.0)
Platelets: 148 10*3/uL — ABNORMAL LOW (ref 150–400)
RBC: 2.85 MIL/uL — ABNORMAL LOW (ref 3.87–5.11)
RDW: 14.1 % (ref 11.5–15.5)
WBC: 6.8 10*3/uL (ref 4.0–10.5)
nRBC: 0 % (ref 0.0–0.2)

## 2023-07-12 LAB — BASIC METABOLIC PANEL
Anion gap: 7 (ref 5–15)
BUN: 12 mg/dL (ref 6–20)
CO2: 25 mmol/L (ref 22–32)
Calcium: 7.6 mg/dL — ABNORMAL LOW (ref 8.9–10.3)
Chloride: 106 mmol/L (ref 98–111)
Creatinine, Ser: 0.73 mg/dL (ref 0.44–1.00)
GFR, Estimated: >60 mL/min (ref >60)
Glucose, Bld: 110 mg/dL — ABNORMAL HIGH (ref 70–99)
Potassium: 3.4 mmol/L — ABNORMAL LOW (ref 3.5–5.1)
Sodium: 138 mmol/L (ref 135–145)

## 2023-07-12 MED ORDER — FUROSEMIDE 10 MG/ML IJ SOLN
40.0000 mg | Freq: Once | INTRAMUSCULAR | Status: AC
  Administered 2023-07-12: 40 mg via INTRAVENOUS
  Filled 2023-07-12: qty 4

## 2023-07-12 MED ORDER — POTASSIUM CHLORIDE CRYS ER 20 MEQ PO TBCR
20.0000 meq | EXTENDED_RELEASE_TABLET | ORAL | Status: AC
  Administered 2023-07-12 (×3): 20 meq via ORAL
  Filled 2023-07-12 (×3): qty 1

## 2023-07-12 NOTE — Plan of Care (Signed)
Pt progressing in goals of care.

## 2023-07-12 NOTE — TOC Initial Note (Signed)
Transition of Care [facility]) - Initial/Assessment Note    Patient Details  Name: Tracy Dyer MRN: [id number] Date of Birth: 1970-12-06  Transition of [facility]) CM/SW Contact:    Elliot Cousin, RN Phone Number: [phone] 07/12/2023, 2:22 PM  Clinical Narrative:                 CM spoke to pt at bedside. Pt states husband at home to assist with care. Pt was independent pta.  Patient has completed her paperwork for her job. Will continue to follow for dc needs.    Expected Discharge Plan: Home w Home Health Services Barriers to Discharge: Continued Medical Work up   Patient Goals and CMS Choice Patient states their goals for this hospitalization and ongoing recovery are:: wants to recover fully          Expected Discharge Plan and Services   Discharge Planning Services: CM Consult   Living arrangements for the past 2 months: Single Family Home                                      Prior Living Arrangements/Services Living arrangements for the past 2 months: Single Family Home Lives with:: Spouse Patient language and need for interpreter reviewed:: Yes Do you feel safe going back to the place where you live?: Yes      Need for Family Participation in Patient Care: Yes (Comment) Care giver support system in place?: Yes (comment)   Criminal Activity/Legal Involvement Pertinent to Current Situation/Hospitalization: No - Comment as needed  Activities of Daily Living   ADL Screening (condition at time of admission) Independently performs ADLs?: Yes (appropriate for developmental age) Is the patient deaf or have difficulty hearing?: No Does the patient have difficulty seeing, even when wearing glasses/contacts?: No Does the patient have difficulty concentrating, remembering, or making decisions?: No  Permission Sought/Granted Permission sought to share information with : Case Manager, Family Supports, PCP Permission granted to share information with :  Yes, Verbal Permission Granted  Share Information with NAME: Blossom Hoops     Permission granted to share info w Relationship: husband  Permission granted to share info w Contact Information: [phone]  Emotional Assessment Appearance:: Appears stated age Attitude/Demeanor/Rapport: Engaged Affect (typically observed): Accepting Orientation: : Oriented to Self, Oriented to Place, Oriented to  Time, Oriented to Situation   Psych Involvement: No (comment)  Admission diagnosis:  S/P MVR (mitral valve repair) [Z98.890] Patient Active Problem List   Diagnosis Date Noted   S/P MVR (mitral valve repair) 07/09/2023   Vitamin D deficiency 05/04/2023   Right lower quadrant pain 05/04/2023   Squamous cell skin cancer 04/19/2023   Cervical radiculopathy 07/08/2022   Occipital neuralgia of right side 07/01/2021   Peripheral vertigo 07/01/2021   Genetic testing 09/04/2020   Family history of breast cancer    Family history of ovarian cancer    Family history of lung cancer    Malignant neoplasm of upper-outer quadrant of left breast in female, estrogen receptor positive (HCC) 08/21/2020   Headache 04/10/2017   Anxiety 11/16/2013   Hypercoagulable state (HCC) 11/16/2013   TIA (transient ischemic attack) 11/15/2013   Chest discomfort 01/20/2012   Factor V Leiden (HCC) 01/20/2012   MVP (mitral valve prolapse) 01/20/2012   Acute bronchitis 12/23/2011   PCP:  Jettie Pagan, NP Pharmacy:   [phone] - [place], [place] - [address]  [address] [place] [address] Phone: [phone] Fax: [phone]  [facility] [address] [place] [address] Phone: [phone] Fax: [phone]  [facility] [address] [place] [address] Phone: [phone] Fax: [phone]     Social Determinants of Health (SDOH) Social History: SDOH Screenings   Food  Insecurity: No Food Insecurity (07/09/2023)  Housing: Low Risk  (07/09/2023)  Transportation Needs: No Transportation Needs (07/09/2023)  Utilities: Not At Risk (07/09/2023)  Financial Resource Strain: Patient Declined (08/14/2022)   [facility]  Physical Activity: Unknown (04/18/2023)   Received from [facility]  Social Connections: Moderately Integrated (04/18/2023)   Received from [facility]  Stress: Patient Declined (04/18/2023)   Received from [name]  Tobacco Use: Low Risk  (07/09/2023)   SDOH Interventions:     Readmission Risk Interventions     No data to display

## 2023-07-12 NOTE — Progress Notes (Signed)
   NAME:  Tracy Dyer, MRN:  [id number], DOB:  1971/04/28, LOS: 3 ADMISSION DATE:  07/09/2023, CONSULTATION DATE:  07/09/2023 REFERRING MD: Tereso Newcomer CHIEF COMPLAINT: Medical management  History of Present Illness:  52 year old woman with a past medical history significant for breast cancer s/p lumpectomy treated with chemo and radiation, anxiety and depression who presented 12/6 for elective repair of mitral valve stenosis per Dr. Leafy Ro.  Pertinent Medical History:  Breast cancer s/p lumpectomy treated with chemo and radiation Anxiety Depression  Significant Hospital Events: Including procedures, antibiotic start and stop dates in addition to other pertinent events   12/6 Presented for elective mitral valve repair per Dr. Leafy Ro. Extubated.  Interim History / Subjective:  No significant events overnight Feeling well overall, tentative about movement Up to chair on rounds, using IS Remains on Levo (max overnight), now down to Midodrine added  Objective:  Blood pressure (!) 88/47, pulse 60, temperature 98.1 F (36.7 C), temperature source Oral, resp. rate 18, height 5\' 6"  (1.676 m), weight 72.9 kg, last menstrual period 09/05/2020, SpO2 99%.        Intake/Output Summary (Last 24 hours) at 07/12/2023 0825 Last data filed at 07/12/2023 0700 Gross per 24 hour  Intake 588.25 ml  Output 1945 ml  Net -1356.75 ml   Filed Weights   07/10/23 0500 07/11/23 0500 07/12/23 0500  Weight: 72 kg 74.2 kg 72.9 kg   Physical Examination: General: Acutely ill-appearing middle-aged woman in NAD. Up to chair. HEENT: [place]/AT, anicteric sclera, PERRL, moist mucous membranes. Neuro: Awake, oriented x 4. Responds to verbal stimuli. Following commands consistently. Moves all 4 extremities spontaneously. Generalized weakness.  CV: RRR, no m/g/r. Midline sternotomy clean/dry/intact without erythema/drainage. PULM: Breathing even and unlabored on RA, +splinting with shallow breaths due to  pain. Lung fields diminished at bases. GI: Soft, nontender, nondistended. Normoactive bowel sounds. Extremities: No LE edema noted. Skin: Warm/dry, no rashes.  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 - Continue home Zoloft  Best Practice (right click and "Reselect all SmartList Selections" daily)   Diet/type: NPO DVT prophylaxis: enoxaparin (LOVENOX) injection 40 mg Start: 07/10/23 2200 SCDs Start: 07/10/23 0746 SCDs Start: 07/09/23 1128 Pressure ulcer(s): not present on admission  GI prophylaxis: PPI Lines: Central line Foley:  Yes, and it is still needed Code Status:  full code Last date of multidisciplinary goals of care discussion: Per TCTS   Signature:   Tim Lair, PA-C [facility] 07/12/23 8:25 AM  Please see [email or web address] for pager details.  From 7A-7P if no response, please call [phone] After hours, please call ELink [phone]

## 2023-07-12 NOTE — Progress Notes (Signed)
[address]       [place] [address]             [phone]      3 Days Post-Op  Procedure(s) (LRB): MITRAL VALVE REPAIR USING SIMULUS ANNULOPLASTY BAND (N/A) TRANSESOPHAGEAL ECHOCARDIOGRAM (N/A)   Total Length of Stay:  LOS: 3 days    SUBJECTIVE: Feels well Ambulated  Vitals:   07/12/23 0700 07/12/23 0715  BP: (!) 111/59   Pulse: 62 60  Resp: 16 18  Temp:    SpO2: 98% 99%    Intake/Output      12/08 0701 12/09 0700 12/09 0701 12/10 0700   P.O.     I.V. (mL/kg) 533.7 (7.3)    IV Piggyback     Total Intake(mL/kg) 533.7 (7.3)    Urine (mL/kg/hr) 2070 (1.2)    Chest Tube 0    Total Output 2070    Net -1536.3             albumin human Stopped (07/10/23 1900)   norepinephrine (LEVOPHED) Adult infusion 7 mcg/min (07/12/23 0713)    CBC    Component Value Date/Time   WBC 6.8 07/12/2023 0337   RBC 2.85 (L) 07/12/2023 0337   HGB 9.0 (L) 07/12/2023 0337   HGB 13.5 06/08/2023 1331   HGB 14.7 05/07/2023 1026   HCT 26.9 (L) 07/12/2023 0337   HCT 43.9 05/07/2023 1026   PLT 148 (L) 07/12/2023 0337   PLT 177 06/08/2023 1331   PLT 234 05/07/2023 1026   MCV 94.4 07/12/2023 0337   MCV 95 05/07/2023 1026   MCH 31.6 07/12/2023 0337   MCHC 33.5 07/12/2023 0337   RDW 14.1 07/12/2023 0337   RDW 13.0 05/07/2023 1026   LYMPHSABS 1.6 06/08/2023 1331   MONOABS 0.4 06/08/2023 1331   EOSABS 0.1 06/08/2023 1331   BASOSABS 0.0 06/08/2023 1331   CMP     Component Value Date/Time   NA 138 07/12/2023 0337   NA 139 05/07/2023 1026   K 3.4 (L) 07/12/2023 0337   CL 106 07/12/2023 0337   CO2 25 07/12/2023 0337   GLUCOSE 110 (H) 07/12/2023 0337   BUN 12 07/12/2023 0337   BUN 11 05/07/2023 1026   CREATININE 0.73 07/12/2023 0337   CREATININE 0.75 06/08/2023 1331   CALCIUM 7.6 (L) 07/12/2023 0337   PROT 6.5 07/07/2023 1157   ALBUMIN 3.9 07/07/2023 1157   AST 33 07/07/2023 1157   AST 25 06/08/2023 1331   ALT 34 07/07/2023 1157   ALT 29  06/08/2023 1331   ALKPHOS 67 07/07/2023 1157   BILITOT 0.8 07/07/2023 1157   BILITOT 0.5 06/08/2023 1331   GFRNONAA >60 07/12/2023 0337   GFRNONAA >60 06/08/2023 1331   ABG    Component Value Date/Time   PHART 7.371 07/09/2023 1407   PCO2ART 41.8 07/09/2023 1407   PO2ART 158 (H) 07/09/2023 1407   HCO3 24.4 07/09/2023 1407   TCO2 26 07/09/2023 1407   ACIDBASEDEF 1.0 07/09/2023 1407   O2SAT 99 07/09/2023 1407   CBG (last 3)  Recent Labs    07/11/23 1939 07/11/23 2350 07/12/23 0404  GLUCAP 106* 105* 88  EXAM Lungs: clear Card: RR no murmur Ext: warm Neuro; intact   ASSESSMENT: POD #3 SP MV repair Hemodynamics ok but still requiring Levo. She runs low BP at home. Will wean for SBP >90 and is on midodrine. DC art line Diurese today Ambulate   [name], MD 07/12/2023

## 2023-07-12 NOTE — Progress Notes (Signed)
Patient ID: Tracy Dyer, female   DOB: 12-21-1970, 52 y.o.   MRN: [id number] TCTS Evening Rounds:  Weaned off NE today. On midodrine. BP still on the low side but just got midodrine. Sinus 57.  Excellent diuresis today which may have kept BP down.   Sats 98% RA.

## 2023-07-13 DIAGNOSIS — Z9889 Other specified postprocedural states: Secondary | ICD-10-CM | POA: Diagnosis not present

## 2023-07-13 LAB — CBC
HCT: 25.7 % — ABNORMAL LOW (ref 36.0–46.0)
Hemoglobin: 8.5 g/dL — ABNORMAL LOW (ref 12.0–15.0)
MCH: 31.6 pg (ref 26.0–34.0)
MCHC: 33.1 g/dL (ref 30.0–36.0)
MCV: 95.5 fL (ref 80.0–100.0)
Platelets: 176 10*3/uL (ref 150–400)
RBC: 2.69 MIL/uL — ABNORMAL LOW (ref 3.87–5.11)
RDW: 14.6 % (ref 11.5–15.5)
WBC: 5.6 10*3/uL (ref 4.0–10.5)
nRBC: 0 % (ref 0.0–0.2)

## 2023-07-13 LAB — BASIC METABOLIC PANEL
Anion gap: 5 (ref 5–15)
BUN: 14 mg/dL (ref 6–20)
CO2: 24 mmol/L (ref 22–32)
Calcium: 7.6 mg/dL — ABNORMAL LOW (ref 8.9–10.3)
Chloride: 108 mmol/L (ref 98–111)
Creatinine, Ser: 0.75 mg/dL (ref 0.44–1.00)
GFR, Estimated: >60 mL/min (ref >60)
Glucose, Bld: 103 mg/dL — ABNORMAL HIGH (ref 70–99)
Potassium: 3.8 mmol/L (ref 3.5–5.1)
Sodium: 137 mmol/L (ref 135–145)

## 2023-07-13 LAB — GLUCOSE, CAPILLARY
Glucose-Capillary: 104 mg/dL — ABNORMAL HIGH (ref 70–99)
Glucose-Capillary: 105 mg/dL — ABNORMAL HIGH (ref 70–99)
Glucose-Capillary: 115 mg/dL — ABNORMAL HIGH (ref 70–99)
Glucose-Capillary: 166 mg/dL — ABNORMAL HIGH (ref 70–99)
Glucose-Capillary: 80 mg/dL (ref 70–99)
Glucose-Capillary: 82 mg/dL (ref 70–99)
Glucose-Capillary: 95 mg/dL (ref 70–99)

## 2023-07-13 MED ORDER — PROCHLORPERAZINE EDISYLATE 10 MG/2ML IJ SOLN
5.0000 mg | Freq: Once | INTRAMUSCULAR | Status: AC
  Administered 2023-07-13: 5 mg via INTRAVENOUS
  Filled 2023-07-13: qty 1

## 2023-07-13 MED ORDER — LORAZEPAM 1 MG PO TABS
2.0000 mg | ORAL_TABLET | Freq: Three times a day (TID) | ORAL | Status: DC | PRN
  Administered 2023-07-13: 2 mg via ORAL
  Filled 2023-07-13: qty 2

## 2023-07-13 MED ORDER — FUROSEMIDE 10 MG/ML IJ SOLN
40.0000 mg | Freq: Once | INTRAMUSCULAR | Status: AC
  Administered 2023-07-13: 40 mg via INTRAVENOUS
  Filled 2023-07-13: qty 4

## 2023-07-13 NOTE — Progress Notes (Signed)
   NAME:  Tracy Dyer, MRN:  [id number], DOB:  May 09, 1971, LOS: 4 ADMISSION DATE:  07/09/2023, CONSULTATION DATE:  07/09/2023 REFERRING MD: Tereso Newcomer CHIEF COMPLAINT: Medical management  History of Present Illness:  52 year old woman with a past medical history significant for breast cancer s/p lumpectomy treated with chemo and radiation, anxiety and depression who presented 12/6 for elective repair of mitral valve stenosis per Dr. Leafy Ro.  Pertinent Medical History:  Breast cancer s/p lumpectomy treated with chemo and radiation Anxiety Depression  Significant Hospital Events: Including procedures, antibiotic start and stop dates in addition to other pertinent events   12/6 Presented for elective mitral valve repair per Dr. Leafy Ro. Extubated.  Interim History / Subjective:  No significant events overnight Weaning off of vasopressors, max 6 overnight now off MAP remains borderline ~60 Ambulated in hall yesterday, tolerating diet Eager to go home  Objective:  Blood pressure (!) 72/45, pulse (!) 55, temperature 98.7 F (37.1 C), resp. rate (!) 25, height 5\' 6"  (1.676 m), weight 72.2 kg, last menstrual period 09/05/2020, SpO2 94%.        Intake/Output Summary (Last 24 hours) at 07/13/2023 0738 Last data filed at 07/13/2023 0700 Gross per 24 hour  Intake 301.15 ml  Output 1755 ml  Net -1453.85 ml   Filed Weights   07/11/23 0500 07/12/23 0500 07/13/23 0346  Weight: 74.2 kg 72.9 kg 72.2 kg   Physical Examination: General: Acutely ill-appearing middle-aged woman in NAD. HEENT: [place]/AT, anicteric sclera, PERRL, moist mucous membranes. Neck: RIJ sheath in place. Neuro: Awake, oriented x 4. Responds to verbal stimuli. Following commands consistently. Moves all 4 extremities spontaneously. Strength 4-5/5 in all 4 extremities.  CV: Mildly bradycardic, regular rhythm, no m/g/r. Midline sternotomy c/d/i PULM: Breathing even and unlabored on RA. Lung fields CTAB, slightly diminished  at bases. GI: Soft, nontender, nondistended. Normoactive bowel sounds. Extremities: No significant LE edema noted. Skin: Warm/dry, no rashes.  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 - Continue home Zoloft  Best Practice (right click and "Reselect all SmartList Selections" daily)   Diet/type: NPO DVT prophylaxis: enoxaparin (LOVENOX) injection 40 mg Start: 07/10/23 2200 SCDs Start: 07/10/23 0746 SCDs Start: 07/09/23 1128 Pressure ulcer(s): not present on admission  GI prophylaxis: PPI Lines: Central line Foley:  Yes, and it is still needed Code Status:  full code Last date of multidisciplinary goals of care discussion: Per TCTS   Signature:   Tim Lair, PA-C  [facility] 07/13/23 7:38 AM  Please see [email or web address] for pager details.  From 7A-7P if no response, please call [phone] After hours, please call ELink [phone]

## 2023-07-13 NOTE — Progress Notes (Signed)
[address]       [place] [address]             [phone]      4 Days Post-Op  Procedure(s) (LRB): MITRAL VALVE REPAIR USING SIMULUS ANNULOPLASTY BAND (N/A) TRANSESOPHAGEAL ECHOCARDIOGRAM (N/A)   Total Length of Stay:  LOS: 4 days    SUBJECTIVE: Feels ok  Ambulating in halls Low bp when she sleeps  Vitals:   07/13/23 0645 07/13/23 0700  BP: (!) 83/54 (!) 72/45  Pulse: (!) 59 (!) 55  Resp: 20 (!) 25  Temp:    SpO2: 95% 94%    Intake/Output      12/09 0701 12/10 0700 12/10 0701 12/11 0700   I.V. (mL/kg) 301.2 (4.2)    Total Intake(mL/kg) 301.2 (4.2)    Urine (mL/kg/hr) 1755 (1)    Chest Tube     Total Output 1755    Net -1453.9         Urine Occurrence 2 x        albumin human Stopped (07/10/23 1900)   norepinephrine (LEVOPHED) Adult infusion Stopped (07/13/23 0615)    CBC    Component Value Date/Time   WBC 5.6 07/13/2023 0315   RBC 2.69 (L) 07/13/2023 0315   HGB 8.5 (L) 07/13/2023 0315   HGB 13.5 06/08/2023 1331   HGB 14.7 05/07/2023 1026   HCT 25.7 (L) 07/13/2023 0315   HCT 43.9 05/07/2023 1026   PLT 176 07/13/2023 0315   PLT 177 06/08/2023 1331   PLT 234 05/07/2023 1026   MCV 95.5 07/13/2023 0315   MCV 95 05/07/2023 1026   MCH 31.6 07/13/2023 0315   MCHC 33.1 07/13/2023 0315   RDW 14.6 07/13/2023 0315   RDW 13.0 05/07/2023 1026   LYMPHSABS 1.6 06/08/2023 1331   MONOABS 0.4 06/08/2023 1331   EOSABS 0.1 06/08/2023 1331   BASOSABS 0.0 06/08/2023 1331   CMP     Component Value Date/Time   NA 137 07/13/2023 0315   NA 139 05/07/2023 1026   K 3.8 07/13/2023 0315   CL 108 07/13/2023 0315   CO2 24 07/13/2023 0315   GLUCOSE 103 (H) 07/13/2023 0315   BUN 14 07/13/2023 0315   BUN 11 05/07/2023 1026   CREATININE 0.75 07/13/2023 0315   CREATININE 0.75 06/08/2023 1331   CALCIUM 7.6 (L) 07/13/2023 0315   PROT 6.5 07/07/2023 1157   ALBUMIN 3.9 07/07/2023 1157   AST 33 07/07/2023 1157   AST 25 06/08/2023 1331    ALT 34 07/07/2023 1157   ALT 29 06/08/2023 1331   ALKPHOS 67 07/07/2023 1157   BILITOT 0.8 07/07/2023 1157   BILITOT 0.5 06/08/2023 1331   GFRNONAA >60 07/13/2023 0315   GFRNONAA >60 06/08/2023 1331   ABG    Component Value Date/Time   PHART 7.371 07/09/2023 1407   PCO2ART 41.8 07/09/2023 1407   PO2ART 158 (H) 07/09/2023 1407   HCO3 24.4 07/09/2023 1407   TCO2 26 07/09/2023 1407   ACIDBASEDEF 1.0 07/09/2023 1407   O2SAT 99 07/09/2023 1407   CBG (last 3)  Recent Labs    07/12/23 2317 07/13/23 0318 07/13/23 0321  GLUCAP 88 166* 105*  EXAM Lungs:clear Card:RR no murmur Ext: warm Neuro: intact   ASSESSMENT: POD #4 sp MV repair Hemodynamics with low bp when she sleeps. On midodrine. Will stop amiodarone and hopefully heart rate increases Leave in unit today DC sheath Ambulate   [name], MD 07/13/2023

## 2023-07-14 DIAGNOSIS — Z9889 Other specified postprocedural states: Secondary | ICD-10-CM | POA: Diagnosis not present

## 2023-07-14 LAB — BASIC METABOLIC PANEL
Anion gap: 4 — ABNORMAL LOW (ref 5–15)
BUN: 12 mg/dL (ref 6–20)
CO2: 22 mmol/L (ref 22–32)
Calcium: 7.9 mg/dL — ABNORMAL LOW (ref 8.9–10.3)
Chloride: 110 mmol/L (ref 98–111)
Creatinine, Ser: 0.75 mg/dL (ref 0.44–1.00)
GFR, Estimated: >60 mL/min (ref >60)
Glucose, Bld: 85 mg/dL (ref 70–99)
Potassium: 3.2 mmol/L — ABNORMAL LOW (ref 3.5–5.1)
Sodium: 136 mmol/L (ref 135–145)

## 2023-07-14 LAB — CBC
HCT: 23.4 % — ABNORMAL LOW (ref 36.0–46.0)
Hemoglobin: 8 g/dL — ABNORMAL LOW (ref 12.0–15.0)
MCH: 32.4 pg (ref 26.0–34.0)
MCHC: 34.2 g/dL (ref 30.0–36.0)
MCV: 94.7 fL (ref 80.0–100.0)
Platelets: 160 10*3/uL (ref 150–400)
RBC: 2.47 MIL/uL — ABNORMAL LOW (ref 3.87–5.11)
RDW: 14.5 % (ref 11.5–15.5)
WBC: 3.5 10*3/uL — ABNORMAL LOW (ref 4.0–10.5)
nRBC: 0 % (ref 0.0–0.2)

## 2023-07-14 LAB — GLUCOSE, CAPILLARY
Glucose-Capillary: 84 mg/dL (ref 70–99)
Glucose-Capillary: 87 mg/dL (ref 70–99)
Glucose-Capillary: 108 mg/dL — ABNORMAL HIGH (ref 70–99)
Glucose-Capillary: 122 mg/dL — ABNORMAL HIGH (ref 70–99)
Glucose-Capillary: 99 mg/dL (ref 70–99)

## 2023-07-14 MED ORDER — INSULIN ASPART 100 UNIT/ML IJ SOLN: 0.0000 [IU] | INTRAMUSCULAR | Status: DC

## 2023-07-14 MED ORDER — INSULIN ASPART 100 UNIT/ML IJ SOLN: 0.0000 [IU] | Freq: Three times a day (TID) | INTRAMUSCULAR | Status: DC

## 2023-07-14 MED ORDER — MIDODRINE HCL 5 MG PO TABS
15.0000 mg | ORAL_TABLET | Freq: Three times a day (TID) | ORAL | Status: DC
  Administered 2023-07-14 – 2023-07-15 (×4): 15 mg via ORAL
  Filled 2023-07-14 (×4): qty 3

## 2023-07-14 MED ORDER — POTASSIUM CHLORIDE CRYS ER 20 MEQ PO TBCR
20.0000 meq | EXTENDED_RELEASE_TABLET | ORAL | Status: AC
  Administered 2023-07-14 (×3): 20 meq via ORAL
  Filled 2023-07-14 (×3): qty 1

## 2023-07-14 MED ORDER — PHENOL 1.4 % MT LIQD
1.0000 | OROMUCOSAL | Status: DC | PRN
  Filled 2023-07-14: qty 177

## 2023-07-14 MED FILL — Sodium Chloride IV Soln 0.9%: INTRAVENOUS | Qty: 2000 | Status: AC

## 2023-07-14 MED FILL — Electrolyte-R (PH 7.4) Solution: INTRAVENOUS | Qty: 4000 | Status: AC

## 2023-07-14 MED FILL — Sodium Bicarbonate IV Soln 8.4%: INTRAVENOUS | Qty: 50 | Status: AC

## 2023-07-14 MED FILL — Mannitol IV Soln 20%: INTRAVENOUS | Qty: 500 | Status: AC

## 2023-07-14 MED FILL — Heparin Sodium (Porcine) Inj 1000 Unit/ML: INTRAMUSCULAR | Qty: 10 | Status: AC

## 2023-07-14 MED FILL — Lidocaine HCl Local Soln Prefilled Syringe 100 MG/5ML (2%): INTRAMUSCULAR | Qty: 5 | Status: AC

## 2023-07-14 NOTE — Progress Notes (Signed)
07/14/2023     I have seen and evaluated the patient for post MVR vasoplegia.   S:  No events, wants to go home. Unassisted walking yesterday.   O: Blood pressure 96/66, pulse 64, temperature 97.6 F (36.4 C), temperature source Oral, resp. rate 17, height 5\' 3"  (1.6 m), weight 81.6 kg, SpO2 94 %.    No distress Aox3 Abd soft Incision sites look good   A:  Post MVR vasoplegia improving   P:  Increase midodrine Encourage mobility Hopefully home tomorrow Will follow while in ICU       Myrla Halsted MD [facility] Prefer epic messenger for cross cover needs If after hours, please call E-link

## 2023-07-14 NOTE — Progress Notes (Signed)
Mobility Specialist Progress Note:   07/14/23 1613  Mobility  Activity Ambulated with assistance in hallway  Level of Assistance  (MinG)  Assistive Device None  Distance Ambulated (ft) 470 ft  RUE Weight Bearing NWB  LUE Weight Bearing NWB  Activity Response Tolerated well  Mobility Referral Yes  Mobility visit 1 Mobility  Mobility Specialist Start Time (ACUTE ONLY) 1430  Mobility Specialist Stop Time (ACUTE ONLY) 1445  Mobility Specialist Time Calculation (min) (ACUTE ONLY) 15 min   Pt received in bed, eager to mobility. SB to stand within precautions. MinG during ambulation. Pt denied any complaints during ambulation, asx throughout. VSS. Pt left in chair with call bell in reach and all needs met.   Tracy Dyer  Mobility Specialist Please contact via [employer] office at [phone]

## 2023-07-14 NOTE — Progress Notes (Signed)
      [address]       [place] [address]             [phone]      5 Days Post-Op  Procedure(s) (LRB): MITRAL VALVE REPAIR USING SIMULUS ANNULOPLASTY BAND (N/A) TRANSESOPHAGEAL ECHOCARDIOGRAM (N/A)   Total Length of Stay:  LOS: 5 days    SUBJECTIVE: Walking well without dizziness  Vitals:   07/14/23 0530 07/14/23 0600  BP:  (!) 84/39  Pulse: (!) 54 (!) 55  Resp: 20 18  Temp:    SpO2: 93% 94%    Intake/Output      12/10 0701 12/11 0700 12/11 0701 12/12 0700   I.V. (mL/kg) 3 (0)    Total Intake(mL/kg) 3 (0)    Urine (mL/kg/hr) 1500 (0.9)    Stool 0    Total Output 1500    Net -1497         Urine Occurrence 4 x    Stool Occurrence 6 x        albumin human Stopped (07/10/23 1900)   norepinephrine (LEVOPHED) Adult infusion Stopped (07/13/23 0615)    CBC    Component Value Date/Time   WBC 3.5 (L) 07/14/2023 0519   RBC 2.47 (L) 07/14/2023 0519   HGB 8.0 (L) 07/14/2023 0519   HGB 13.5 06/08/2023 1331   HGB 14.7 05/07/2023 1026   HCT 23.4 (L) 07/14/2023 0519   HCT 43.9 05/07/2023 1026   PLT 160 07/14/2023 0519   PLT 177 06/08/2023 1331   PLT 234 05/07/2023 1026   MCV 94.7 07/14/2023 0519   MCV 95 05/07/2023 1026   MCH 32.4 07/14/2023 0519   MCHC 34.2 07/14/2023 0519   RDW 14.5 07/14/2023 0519   RDW 13.0 05/07/2023 1026   LYMPHSABS 1.6 06/08/2023 1331   MONOABS 0.4 06/08/2023 1331   EOSABS 0.1 06/08/2023 1331   BASOSABS 0.0 06/08/2023 1331   CMP     Component Value Date/Time   NA 136 07/14/2023 0519   NA 139 05/07/2023 1026   K 3.2 (L) 07/14/2023 0519   CL 110 07/14/2023 0519   CO2 22 07/14/2023 0519   GLUCOSE 85 07/14/2023 0519   BUN 12 07/14/2023 0519   BUN 11 05/07/2023 1026   CREATININE 0.75 07/14/2023 0519   CREATININE 0.75 06/08/2023 1331   CALCIUM 7.9 (L) 07/14/2023 0519   PROT 6.5 07/07/2023 1157   ALBUMIN 3.9 07/07/2023 1157   AST 33 07/07/2023 1157   AST 25 06/08/2023 1331   ALT 34 07/07/2023 1157    ALT 29 06/08/2023 1331   ALKPHOS 67 07/07/2023 1157   BILITOT 0.8 07/07/2023 1157   BILITOT 0.5 06/08/2023 1331   GFRNONAA >60 07/14/2023 0519   GFRNONAA >60 06/08/2023 1331   ABG    Component Value Date/Time   PHART 7.371 07/09/2023 1407   PCO2ART 41.8 07/09/2023 1407   PO2ART 158 (H) 07/09/2023 1407   HCO3 24.4 07/09/2023 1407   TCO2 26 07/09/2023 1407   ACIDBASEDEF 1.0 07/09/2023 1407   O2SAT 99 07/09/2023 1407   CBG (last 3)  Recent Labs    07/13/23 2055 07/13/23 2344 07/14/23 0334  GLUCAP 115* 104* 84  EXAM Lungs: clear Card: rr without murmur Ext: warm Neuro: intact   ASSESSMENT: POD #5 sp MV repair Doing well Will transfer to floor Increase midodrine to 15mg  tid Hold diuresis today   [name], MD 07/14/2023

## 2023-07-15 ENCOUNTER — Inpatient Hospital Stay (HOSPITAL_COMMUNITY): Payer: BC Managed Care – PPO

## 2023-07-15 LAB — CBC
HCT: 23.9 % — ABNORMAL LOW (ref 36.0–46.0)
Hemoglobin: 7.8 g/dL — ABNORMAL LOW (ref 12.0–15.0)
MCH: 31.5 pg (ref 26.0–34.0)
MCHC: 32.6 g/dL (ref 30.0–36.0)
MCV: 96.4 fL (ref 80.0–100.0)
Platelets: 191 10*3/uL (ref 150–400)
RBC: 2.48 MIL/uL — ABNORMAL LOW (ref 3.87–5.11)
RDW: 14.7 % (ref 11.5–15.5)
WBC: 4.3 10*3/uL (ref 4.0–10.5)
nRBC: 0 % (ref 0.0–0.2)

## 2023-07-15 LAB — BASIC METABOLIC PANEL
Anion gap: 5 (ref 5–15)
BUN: 10 mg/dL (ref 6–20)
CO2: 23 mmol/L (ref 22–32)
Calcium: 8.6 mg/dL — ABNORMAL LOW (ref 8.9–10.3)
Chloride: 113 mmol/L — ABNORMAL HIGH (ref 98–111)
Creatinine, Ser: 0.71 mg/dL (ref 0.44–1.00)
GFR, Estimated: >60 mL/min (ref >60)
Glucose, Bld: 91 mg/dL (ref 70–99)
Potassium: 3.9 mmol/L (ref 3.5–5.1)
Sodium: 141 mmol/L (ref 135–145)

## 2023-07-15 LAB — GLUCOSE, CAPILLARY: Glucose-Capillary: 76 mg/dL (ref 70–99)

## 2023-07-15 MED ORDER — FE FUM-VIT C-VIT B12-FA 460-60-0.01-1 MG PO CAPS: 1.0000 | ORAL_CAPSULE | Freq: Every day | ORAL | Status: DC

## 2023-07-15 MED ORDER — TRAMADOL HCL 50 MG PO TABS: 50.0000 mg | ORAL_TABLET | Freq: Four times a day (QID) | ORAL | 0 refills | Status: AC | PRN

## 2023-07-15 MED ORDER — FUROSEMIDE 20 MG PO TABS: 20.0000 mg | ORAL_TABLET | Freq: Every day | ORAL | 0 refills | Status: DC

## 2023-07-15 MED ORDER — FE FUM-VIT C-VIT B12-FA 460-60-0.01-1 MG PO CAPS
1.0000 | ORAL_CAPSULE | Freq: Every day | ORAL | Status: DC
  Administered 2023-07-15: 1 via ORAL
  Filled 2023-07-15: qty 1

## 2023-07-15 MED ORDER — ASPIRIN 325 MG PO TBEC: 325.0000 mg | DELAYED_RELEASE_TABLET | Freq: Every day | ORAL | Status: DC

## 2023-07-15 MED ORDER — POTASSIUM CHLORIDE CRYS ER 10 MEQ PO TBCR: 10.0000 meq | EXTENDED_RELEASE_TABLET | Freq: Every day | ORAL | 0 refills | Status: DC

## 2023-07-15 MED ORDER — POTASSIUM CHLORIDE CRYS ER 20 MEQ PO TBCR
20.0000 meq | EXTENDED_RELEASE_TABLET | Freq: Every day | ORAL | Status: DC
  Administered 2023-07-15: 20 meq via ORAL
  Filled 2023-07-15: qty 1

## 2023-07-15 MED ORDER — FUROSEMIDE 40 MG PO TABS
40.0000 mg | ORAL_TABLET | Freq: Every day | ORAL | Status: DC
  Filled 2023-07-15: qty 1

## 2023-07-15 MED ORDER — MIDODRINE HCL 5 MG PO TABS: 15.0000 mg | ORAL_TABLET | Freq: Three times a day (TID) | ORAL | 1 refills | Status: DC

## 2023-07-15 NOTE — Plan of Care (Signed)
  Problem: Clinical Measurements: Goal: Will remain free from infection Outcome: Progressing Goal: Respiratory complications will improve Outcome: Progressing Goal: Cardiovascular complication will be avoided Outcome: Progressing   Problem: Activity: Goal: Risk for activity intolerance will decrease Outcome: Progressing   Problem: Nutrition: Goal: Adequate nutrition will be maintained Outcome: Progressing

## 2023-07-15 NOTE — Progress Notes (Signed)
      [address]       [place] [address]             [phone]      6 Days Post-Op Procedure(s) (LRB): MITRAL VALVE REPAIR USING SIMULUS ANNULOPLASTY BAND (N/A) TRANSESOPHAGEAL ECHOCARDIOGRAM (N/A) Subjective: The patient states she is very excited to go home today. She does state she had increased sternal soreness last night but that improved with Tramadol.   Objective: Vital signs in last 24 hours: Temp:  [97.7 F (36.5 C)-98.8 F (37.1 C)] 98.5 F (36.9 C) (12/12 0300) Pulse Rate:  [51-64] 57 (12/12 0300) Cardiac Rhythm: Sinus bradycardia (12/11 2200) Resp:  [18-31] 18 (12/12 0300) BP: (83-99)/(39-54) 88/43 (12/12 0300) SpO2:  [81 %-100 %] 96 % (12/12 0300) Weight:  [73 kg] 73 kg (12/12 0557)  Hemodynamic parameters for last 24 hours:    Intake/Output from previous day: 12/11 0701 - 12/12 0700 In: 0  Out: 301 [Urine:301] Intake/Output this shift: No intake/output data recorded.  General appearance: alert, cooperative, and no distress Neurologic: intact Heart: regular rate and rhythm, S1, S2 normal, no murmur, click, rub or gallop Lungs: clear to auscultation bilaterally Abdomen: soft, non-tender; bowel sounds normal; no masses,  no organomegaly Extremities: edema trace Wound: Clean and dry without sign of infection  Lab Results: Recent Labs    07/14/23 0519 07/15/23 0253  WBC 3.5* 4.3  HGB 8.0* 7.8*  HCT 23.4* 23.9*  PLT 160 191   BMET:  Recent Labs    07/14/23 0519 07/15/23 0253  NA 136 141  K 3.2* 3.9  CL 110 113*  CO2 22 23  GLUCOSE 85 91  BUN 12 10  CREATININE 0.75 0.71  CALCIUM 7.9* 8.6*    PT/INR: No results for input(s): "LABPROT", "INR" in the last 72 hours. ABG    Component Value Date/Time   PHART 7.371 07/09/2023 1407   HCO3 24.4 07/09/2023 1407   TCO2 26 07/09/2023 1407   ACIDBASEDEF 1.0 07/09/2023 1407   O2SAT 99 07/09/2023 1407   CBG (last 3)  Recent Labs    07/14/23 1718 07/14/23 2124  07/15/23 0601  GLUCAP 122* 99 76    Assessment/Plan: S/P Procedure(s) (LRB): MITRAL VALVE REPAIR USING SIMULUS ANNULOPLASTY BAND (N/A) TRANSESOPHAGEAL ECHOCARDIOGRAM (N/A)  Neuro: Increased pain last night but pain controlled this AM after Tramadol last night.   CV: Postoperative vasoplegia. BP soft, SBP 85-99. Sinus bradycardia-NSR, HR 50s-70s. On Midodrine 15mg  TID. Hopefully will continue to improve with time. Patient states her BP runs soft at baseline.   Pulm: Saturating well on RA. CXR with resolved pulmonary congestion and continued bibasilar atelectasis. Encourage IS and ambulation.   GI: Tolerating a diet, nausea resolved. +BM 12/10  Endo: No hx of DM, CBGs controlled. Will d/c SSI and finger sticks  Renal: Cr 0.71, UO 301cc/24hrs recorded. +15lbs? No diuresis has been given due to vasoplegia/hypotension. As discussed with Dr. Leafy Ro will d/c home with daily PO Lasix.   Expected postop ABLA: H/H 7.8/23.9, trending down. Not clinically significant at this time since it is not at transfusion threshold. Will start Trigels.   DVT Prophylaxis: Lovenox  Dispo: Per Dr. Leafy Ro d/c patient to home today   LOS: 6 days    Jenny Reichmann, PA-C 07/15/2023

## 2023-07-15 NOTE — Progress Notes (Signed)
Discharge instructions reviewed with pt.  Copy of instructions given to pt. Pt informed her scripts were sent to her pharmacy for pick up. Husband has now arrived, pt getting dressed. Pt to be d/c'd via wheelchair with belongings, with husband.             To be escorted by hospital volunteer.   Annice Needy, RN SWOT

## 2023-07-15 NOTE — Progress Notes (Signed)
Pt received [facility] book and education on restrictions, heart healthy diet, ex guidelines, Move in the Tube sheet, incentive spirometer use when d/c and CRPII. Pt denies questions and was encouraged to look in the book for additional information. Referral placed to [facility].    Tracy Dyer 07/15/2023 9:19 AM

## 2023-07-19 ENCOUNTER — Telehealth: Payer: Self-pay | Admitting: *Deleted

## 2023-07-19 NOTE — Telephone Encounter (Signed)
Patient contacted the office with post-op questions. Per patient she has been experiencing constipation. States some of the constipation has resolved since placing her call. Patient states she is taking a stool softener and miralax, encouraged patient to continue taking meds. Patient c/o right lower back pain. Advised it may be related to constipation. Advised to continue to monitor. Patient states BP has been stable at home, 109/60's. Advised to continue taking VS daily. Advised to contact our office with any further concerns.

## 2023-07-20 ENCOUNTER — Telehealth (HOSPITAL_COMMUNITY): Payer: Self-pay

## 2023-07-20 NOTE — Progress Notes (Signed)
[address]       [place] [address]             [phone]       HPI: Ms. Ashdown is a 52 year old active woman with a past medical history of mitral valve regurgitation, breast cancer (s/p lumpectomy, aggressive chemo and Radiation therapy), and Factor V Leiden. The patient returns for routine postoperative follow-up having undergone mitral valve repair utilizing a 38mm Simulus Semi rigid band and an edge to edge repair by Dr. Leafy Ro on 07/09/23. The patient's early postoperative recovery while in the hospital was notable for postoperative vasoplegia requiring initiation of Midodrine. Since hospital discharge the patient reports sternal soreness that radiates to her right shoulder blade. She states Tylenol did not help so she has only been taking Tramadol as needed at night. She does report night sweats that started since surgery. She has shortness of breath with exertion and deep breaths but this is improving. She also reports one episode of nausea and vomiting the day after returning home from the hospital. She also admits to a presyncopal episode last week after having diarrhea, taking her Lasix and getting in the shower. She is ambulating daily. She does report she did not take her midodrine this AM because she wanted to know what her blood pressure was without it.   Current Outpatient Medications  Medication Sig Dispense Refill   aspirin EC 325 MG tablet Take 1 tablet (325 mg total) by mouth daily.     Black Cohosh 540 MG CAPS Take 540 mg by mouth daily.     clonazePAM (KLONOPIN) 1 MG tablet Take 1 tablet (1 mg total) by mouth 2 (two) times daily as needed for anxiety. 60 tablet 0   Collagen-Vitamin C-Biotin (COLLAGEN PO) Take 1 Scoop by mouth daily.     Creatine POWD Take 1 Scoop by mouth daily.     estradiol (ESTRACE VAGINAL) 0.1 MG/GM vaginal cream Place 1 Applicatorful vaginally 3 (three) times a week. 42.5 g 12   Fe Fum-Vit C-Vit B12-FA (TRIGELS-F FORTE)  CAPS capsule Take 1 capsule by mouth daily. May substitute with similar generic medication     fexofenadine (ALLEGRA) 180 MG tablet Take 180 mg by mouth daily as needed for allergies or rhinitis.     furosemide (LASIX) 20 MG tablet Take 1 tablet (20 mg total) by mouth daily. Take for 10 days then stop. If you develop dizziness please stop the medication and call [facility] office. 10 tablet 0   MAGNESIUM CITRATE PO Take 405 mg by mouth daily.     midodrine (PROAMATINE) 5 MG tablet Take 3 tablets (15 mg total) by mouth 3 (three) times daily with meals. 270 tablet 1   Misc Natural Products (JOINT SUPPORT COMPLEX PO) Take 4 capsules by mouth daily.     Omega-3 Fatty Acids (FISH OIL PO) Take 813 mg by mouth daily.     potassium chloride SA (KLOR-CON M) 10 MEQ tablet Take 1 tablet (10 mEq total) by mouth daily. Take for 10 days then stop. Please stop taking if you stop taking Lasix 10 tablet 0   Prenatal Vit-Fe Fumarate-FA (PRENATAL PO) Take 3 capsules by mouth daily.     sertraline (ZOLOFT) 100 MG tablet Take one tablet (100 mg dose) by mouth at bedtime. 90 tablet 1   tirzepatide (ZEPBOUND) 15 MG/0.5ML Pen Inject 15 mg into the skin once a week. 6 mL 0   traMADol (ULTRAM) 50 MG  tablet Take 1 tablet (50 mg total) by mouth every 6 (six) hours as needed for moderate pain (pain score 4-6). 28 tablet 0   Vitamin D-Vitamin K (VITAMIN D2 + K1 PO) Take 2 drops by mouth daily. Liquid     No current facility-administered medications for this visit.   Vitals: Today's Vitals   07/26/23 1232  BP: 110/70  Pulse: 70  Resp: 18  SpO2: 97%  Weight: 154 lb (69.9 kg)   Body mass index is 24.86 kg/m.  Physical Exam: General: Alert and oriented, no acute distress Neuro: Grossly intact CV: regular rate and rhythm, no murmur Pulm: Clear to auscultation bilaterally GI: +BS, no distension, nontender Extremities: No edema Wound: Sternal incision is healing well, minimal redness around her left chest tube site and  central line site, no purulent drainage, not tender to palpation, no clear sign of infection, left chest tube site with scab in place  Diagnostic Tests: CLINICAL DATA:  Mitral valve replacement   EXAM: CHEST - 2 VIEW   COMPARISON:  07/15/2023   FINDINGS: Status post median sternotomy with mitral valve prosthesis. Trace pleural effusions. No acute airspace opacity. The visualized skeletal structures are unremarkable.   IMPRESSION: 1. Status post median sternotomy with mitral valve prosthesis. 2. Trace pleural effusions. No acute airspace opacity.     Electronically Signed   By: Jearld Lesch M.D.   On: 07/26/2023 12:03    Impression/Plan: S/P mvrepair: The patient is progressing okay from mitral valve repair. She is having sternal soreness and has been using Tramadol most nights because the Tylenol does not help. She states her back has been sore since she cannot stretch and has been sleeping awkwardly. We discussed using Tylenol as well as Tramadol and heat and lidocaine patches if needed. She admits to minor dyspnea with exertion especially with stairs but this is improving with time. She has trace bilateral pleural effusions on CXR. She continues to take Midodrine 5mg  TID but held this today. She reports continued dizzy spells with ambulation. Instructed her to continuing taking this. Will hold on any diuresis for pleural effusions due to her hypotension and dizziness. Her incisions are overall healing well. Her sternal incision is healing well, left chest tube site looks slightly dehisced with minor redness but no purulent drainage, fever or chills. I cleaned it with betadine and instructed her to continue cleaning this at home. If it worsens or she develops fever or chills she was instructed to call our office. She is having night sweats but said she would follow up with her PCP. Her appetite is slowly improving and she has not had any further emesis. I will not make any medication  changes at this time. We reviewed continued sternal precautions and endocarditis prophylaxis. She will return to clinic with Dr. Leafy Ro with CXR in 3 weeks.    Jenny Reichmann, PA-C [facility] [phone]

## 2023-07-20 NOTE — Telephone Encounter (Signed)
Called patient to see if she is interested in the Cardiac Rehab Program. Patient expressed interest. Explained scheduling process and went over insurance, patient verbalized understanding. Will contact patient for scheduling once f/u has been completed. 

## 2023-07-21 ENCOUNTER — Other Ambulatory Visit: Payer: Self-pay | Admitting: Thoracic Surgery (Cardiothoracic Vascular Surgery)

## 2023-07-21 DIAGNOSIS — I341 Nonrheumatic mitral (valve) prolapse: Secondary | ICD-10-CM

## 2023-07-25 ENCOUNTER — Encounter: Payer: Self-pay | Admitting: Adult Health

## 2023-07-26 ENCOUNTER — Encounter: Payer: Self-pay | Admitting: Physician Assistant

## 2023-07-26 ENCOUNTER — Ambulatory Visit
Admission: RE | Admit: 2023-07-26 | Discharge: 2023-07-26 | Disposition: A | Discharge status: Home / Self Care | Payer: BC Managed Care – PPO | Source: Ambulatory Visit | Attending: Thoracic Surgery (Cardiothoracic Vascular Surgery) | Admitting: Thoracic Surgery (Cardiothoracic Vascular Surgery)

## 2023-07-26 ENCOUNTER — Ambulatory Visit (INDEPENDENT_AMBULATORY_CARE_PROVIDER_SITE_OTHER): Payer: BC Managed Care – PPO | Admitting: Physician Assistant

## 2023-07-26 VITALS — BP 110/70 | HR 70 | Resp 18 | Wt 154.0 lb

## 2023-07-26 DIAGNOSIS — I341 Nonrheumatic mitral (valve) prolapse: Secondary | ICD-10-CM

## 2023-07-26 DIAGNOSIS — I340 Nonrheumatic mitral (valve) insufficiency: Secondary | ICD-10-CM

## 2023-07-26 DIAGNOSIS — Z952 Presence of prosthetic heart valve: Secondary | ICD-10-CM | POA: Diagnosis not present

## 2023-07-26 NOTE — Patient Instructions (Signed)
Continue to avoid any heavy lifting or strenuous use of your arms or shoulders for at least a total of three months from the time of surgery.  After three months you may gradually increase how much you lift or otherwise use your arms or chest as tolerated, with limits based upon whether or not activities lead to the return of significant discomfort.  Endocarditis is a potentially serious infection of heart valves or inside lining of the heart.  It occurs more commonly in patients with diseased heart valves (such as patient's with aortic or mitral valve disease) and in patients who have undergone heart valve repair or replacement.  Certain surgical and dental procedures may put you at risk, such as dental cleaning, other dental procedures, or any surgery involving the respiratory, urinary, gastrointestinal tract, gallbladder or prostate gland.   To minimize your chances for develooping endocarditis, maintain good oral health and seek prompt medical attention for any infections involving the mouth, teeth, gums, skin or urinary tract.    Always notify your doctor or dentist about your underlying heart valve condition before having any invasive procedures. You will need to take antibiotics before certain procedures, including all routine dental cleanings or other dental procedures.  Your cardiologist or dentist should prescribe these antibiotics for you to be taken ahead of time.      

## 2023-07-27 ENCOUNTER — Other Ambulatory Visit: Payer: Self-pay

## 2023-07-27 ENCOUNTER — Other Ambulatory Visit (HOSPITAL_BASED_OUTPATIENT_CLINIC_OR_DEPARTMENT_OTHER): Payer: Self-pay

## 2023-07-29 NOTE — Telephone Encounter (Signed)
Please address this message from 12/22 tomorrow as it is now 43 days old.  Lorayne Marek, RN

## 2023-07-31 ENCOUNTER — Other Ambulatory Visit (HOSPITAL_BASED_OUTPATIENT_CLINIC_OR_DEPARTMENT_OTHER): Payer: Self-pay

## 2023-08-01 NOTE — Progress Notes (Unsigned)
Cardiology Office Note    Date:  08/02/2023  ID:  Tracy Dyer, DOB Nov 09, 1970, MRN [id number] PCP:  Tracy Pagan, NP  Cardiologist:  Tracy Fus, MD  Electrophysiologist:  None   Chief Complaint: Follow up MVR   History of Present Illness: .    Tracy Dyer is a 52 y.o. female with visit-pertinent history of stroke at 52 y/o associated with birth control, malignant left breast cancer in 08/2020, severe mitral valve regurgitation s/p mitral valve repair utilizing a 38 mm stimulus semirigid band and edge-to-edge repair with Dr. Leafy Dyer on 07/09/23.   First evaluated by Dr. Wyline Dyer on 05/07/2023 at the request of her PCP for a more pronounced murmur as well as palpitations.  Echo on 05/05/2023 indicated posterior leaflet prolapse with possible flail component, mild to moderate mitral valve regurgitation.  TEE on 05/14/2023 indicated moderate to severe mitral valve regurgitation.  Left heart catheterization on 05/28/2023 showed no evidence of coronary artery disease.  On 07/09/2023 she underwent MVR with Dr. Leafy Dyer.  Her early postoperative recovery was notable for postoperative vasoplegia, requiring initiation of a midodrine.  She was discharged on 07/15/2023 in stable condition.  She was seen by Tracy Gell, PA with [facility] on 07/26/23, she remains in typical condition although did note having sternal soreness requiring tramadol most nights.  She also admitted to minor dyspnea with exertion specifically with stairs although it this was improving.  She had a trace bilateral pleural effusions on chest x-ray.  Today she presents for follow-up.  She reports that she has had slow improvement. Notes she has struggled as she is someone that is generally very active, and has had to slow down significantly. She reports she is slowly making progress. She reports night sweats that started since surgery. Her sternal incision pain is improving. She feels her breathing is overall improving as well.  She  reports that her dizziness has significantly improved without any further episodes of presyncope, she questions if her dizziness was more related to increased MiraLAX and Dulcolax use followed by diarrhea. She denies any further recurrence.  We reviewed SBE prophylaxis, all questions answered.  ROS: .   Today she denies lower extremity edema, fatigue, palpitations, melena, hematuria, hemoptysis, diaphoresis, weakness, presyncope, syncope, orthopnea, and PND.  All other systems are reviewed and otherwise negative. Studies Reviewed: Marland Kitchen    EKG:  EKG is ordered today, personally reviewed, demonstrating  EKG Interpretation Date/Time:  Monday August 02 2023 09:10:57 EST Ventricular Rate:  57 PR Interval:  160 QRS Duration:  90 QT Interval:  464 QTC Calculation: 451 R Axis:   3  Text Interpretation: Sinus bradycardia T wave inversion in anterolateral leads Nonspecific T wave abnormality in inferior leads Confirmed by Tracy Dyer [phone]) on 08/02/2023 9:42:36 AM    CV Studies:  Cardiac Studies & Procedures   CARDIAC CATHETERIZATION  CARDIAC CATHETERIZATION 05/28/2023  Narrative 1.  Normal right dominant coronary circulation. 2.  Fick cardiac output of 6 L/min and Fick cardiac index of 3.4 L/min with the following hemodynamics: Right atrial pressure mean of 9 mmHg Right ventricular pressure 27/7 with end-diastolic pressure of 12 mmHg Wedge pressure mean of 11 mmHg with V waves to 12 mmHg Pulmonary artery pressure 27/11 with a mean of 18 mmHg PVR of 1.2 Woods units PA pulsatility index of 1.8 3.  LVEDP of 14 mmHg:  Recommendation: Continue evaluation for mitral valve intervention.  Findings Coronary Findings Diagnostic  Dominance: Right  No diagnostic findings have been documented. Intervention  No interventions have been documented.    ECHOCARDIOGRAM  ECHOCARDIOGRAM COMPLETE 05/05/2023  Narrative ECHOCARDIOGRAM REPORT    Patient Name:   Tracy Dyer  Date of Exam:  05/05/2023 Medical Rec #:  [id number]     Height:       66.0 in Accession #:    [id number]    Weight:       153.5 lb Date of Birth:  07-31-1971    BSA:          1.787 m Patient Age:    51 years      BP:           127/78 mmHg Patient Gender: F             HR:           67 bpm. Exam Location:  [address]  Procedure: 2D Echo, Cardiac Doppler, Color Doppler and 3D Echo  Indications:    Murmur R01.1  History:        Patient has prior history of Echocardiogram examinations, most recent 03/24/2022.  Sonographer:    Tracy Dyer RCS Referring Phys: [phone] Tracy Dyer   Sonographer Comments: Global longitudinal strain was attempted. IMPRESSIONS   1. Left ventricular ejection fraction, by estimation, is 60 to 65%. Left ventricular ejection fraction by 3D volume is 67 %. The left ventricle has normal function. The left ventricle has no regional wall motion abnormalities. Left ventricular diastolic parameters are indeterminate. 2. Right ventricular systolic function is normal. The right ventricular size is normal. Tricuspid regurgitation signal is inadequate for assessing PA pressure. 3. Left atrial size was moderately dilated. 4. 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 5. The aortic valve is tricuspid. Aortic valve regurgitation is not visualized. No aortic stenosis is present. 6. The inferior vena cava is normal in size with <50% respiratory variability, suggesting right atrial pressure of 8 mmHg.  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  Conclusion(s)/Recommendation(s): Consider cardiology consult and transesophageal echocardiogram. Clinical correlation  required.  FINDINGS Left Ventricle: Left ventricular ejection fraction, by estimation, is 60 to 65%. Left ventricular ejection fraction by 3D volume is 67 %. The left ventricle has normal function. The left ventricle has no regional wall motion abnormalities. The left ventricular internal cavity size was normal in size. There is no left ventricular hypertrophy of the basal-septal segment. Left ventricular diastolic parameters are indeterminate.  Right Ventricle: The right ventricular size is normal. No increase in right ventricular wall thickness. Right ventricular systolic function is normal. Tricuspid regurgitation signal is inadequate for assessing PA pressure. The tricuspid regurgitant velocity is 1.68 m/s, and with an assumed right atrial pressure of 8 mmHg, the estimated right ventricular systolic pressure is 19.3 mmHg.  Left Atrium: Left atrial size was moderately dilated.  Right Atrium: Right atrial size was normal in size.  Pericardium: There is no evidence of pericardial effusion.  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  Tricuspid Valve: The tricuspid valve is normal in structure. Tricuspid valve regurgitation is trivial. No evidence of tricuspid stenosis.  Aortic Valve:  The aortic valve is tricuspid. Aortic valve regurgitation is not visualized. No aortic stenosis is present.  Pulmonic Valve: The pulmonic valve was normal in structure. Pulmonic valve regurgitation is not visualized. No evidence of pulmonic stenosis.  Aorta: The aortic root and ascending aorta are structurally normal, with no evidence of dilitation.  Venous: The inferior vena cava is normal in size with less than 50% respiratory variability, suggesting right atrial pressure of 8 mmHg.  IAS/Shunts: The  interatrial septum was not well visualized.   LEFT VENTRICLE PLAX 2D LVIDd:         4.30 cm         Diastology LVIDs:         2.40 cm         LV e' medial:    9.90 cm/s LV PW:         1.00 cm         LV E/e' medial:  10.4 LV IVS:        1.00 cm         LV e' lateral:   9.57 cm/s LVOT diam:     2.00 cm         LV E/e' lateral: 10.8 LV SV:         57 LV SV Index:   32 LVOT Area:     3.14 cm        3D Volume EF LV 3D EF:    Left ventricul ar ejection fraction by 3D volume is 67 %.  3D Volume EF: 3D EF:        67 % LV EDV:       126 ml LV ESV:       41 ml LV SV:        85 ml  RIGHT VENTRICLE RV Basal diam:  2.90 cm RV S prime:     14.10 cm/s TAPSE (M-mode): 2.4 cm  LEFT ATRIUM             Index        RIGHT ATRIUM           Index LA diam:        4.30 cm 2.41 cm/m   RA Area:     12.30 cm LA Vol (A2C):   71.2 ml 39.84 ml/m  RA Volume:   26.50 ml  14.83 ml/m LA Vol (A4C):   78.3 ml 43.81 ml/m LA Biplane Vol: 80.0 ml 44.77 ml/m AORTIC VALVE LVOT Vmax:   77.40 cm/s LVOT Vmean:  51.400 cm/s LVOT VTI:    0.180 m  AORTA Ao Root diam: 2.80 cm Ao Asc diam:  3.10 cm  MITRAL VALVE                  TRICUSPID VALVE MV Area (PHT): 2.74 cm       TR Peak grad:   11.3 mmHg MV Decel Time: 277 msec       TR Vmax:        168.00 cm/s MR Peak grad:    100.4 mmHg MR Mean grad:    72.0 mmHg    SHUNTS MR Vmax:         501.00 cm/s  Systemic VTI:  0.18 m MR Vmean:        407.0 cm/s   Systemic Diam: 2.00 cm MR PISA:         6.28 cm MR PISA Eff ROA: 46 mm MR PISA Radius:  1.00 cm MV E  velocity: 103.00 cm/s MV A velocity: 65.00 cm/s MV E/A ratio:  1.58  Sunit Tolia Electronically signed by Tessa Lerner Signature Date/Time: 05/05/2023/4:29:11 PM    Final  TEE  ECHO INTRAOPERATIVE TEE 07/09/2023  Narrative *INTRAOPERATIVE TRANSESOPHAGEAL REPORT *    Patient Name:   KAMREY Mcnabb   Date of Exam: 07/09/2023 Medical Rec #:  [id number]      Height:       66.0 in Accession #:     [id number]     Weight:       145.0 lb Date of Birth:  1971-01-09     BSA:          1.74 m Patient Age:    52 years       BP:           103/64 mmHg Patient Gender: F              HR:           53 bpm. Exam Location:  Anesthesiology  Transesophogeal exam was perform intraoperatively during surgical procedure. Patient was closely monitored under general anesthesia during the entirety of examination.  Indications:     MV repair Performing Phys: [id number] [name]  Complications: No known complications during this procedure. POST-OP IMPRESSIONS _ Left Ventricle: The left ventricle is unchanged from pre-bypass. _ Right Ventricle: The right ventricle appears unchanged from pre-bypass. _ Left Atrial Appendage: The left atrial appendage appears unchanged from pre-bypass. _ Aortic Valve: The aortic valve appears unchanged from pre-bypass. _ Mitral Valve: No stenosis present. There is no regurgitation. The gradient recorded across the prosthetic valve is within the expected range. The mitral valve is status post repair with an annuloplasty ring. _ Tricuspid Valve: The tricuspid valve appears unchanged from pre-bypass. _ Pulmonic Valve: The pulmonic valve appears unchanged from pre-bypass.  PRE-OP FINDINGS Left Ventricle: The left ventricle has normal systolic function, with an ejection fraction of 60-65%. The cavity size was normal. There is no increase in left ventricular wall thickness. No evidence of left ventricular regional wall motion abnormalities. There is no left ventricular hypertrophy.   Right Ventricle: The right ventricle has normal systolic function. The cavity was normal. There is no increase in right ventricular wall thickness.  Left Atrium: Left atrial size was not assessed. No left atrial/left atrial appendage thrombus was detected.  Right Atrium: Right atrial size was not assessed.  Interatrial Septum: No atrial level shunt detected by color flow  Doppler.  Pericardium: There is no evidence of pericardial effusion.  Mitral Valve: The mitral valve is myxomatous. Mitral valve regurgitation is moderate by color flow Doppler. There are multiple MR jets which are centrally and anteriorly directed. There is No evidence of mitral stenosis. Severe prolapse of the mitral valve A1, P1 and P2 cusps was present.  Tricuspid Valve: The tricuspid valve was normal in structure. Tricuspid valve regurgitation was not visualized by color flow Doppler.  Aortic Valve: The aortic valve is normal in structure. Aortic valve regurgitation is trivial by color flow Doppler.   Pulmonic Valve: The pulmonic valve was normal in structure. Pulmonic valve regurgitation is not visualized by color flow Doppler.   +-------------+---------++ MITRAL VALVE           +-------------+---------++ MV Peak grad:5.0 mmHg  +-------------+---------++ MV Mean grad:2.0 mmHg  +-------------+---------++ MV Vmax:     1.12 m/s  +-------------+---------++ MV Vmean:    53.2 cm/s +-------------+---------++ MV VTI:      0.33 m    +-------------+---------++  Gaynelle Adu MD Electronically signed by Gaynelle Adu MD Signature Date/Time: 07/09/2023/12:25:38 PM    Final            Current Reported Medications:.    Current Meds  Medication Sig   amoxicillin (AMOXIL) 500 MG capsule Take 4 capsules once as needed 1 hour prior to dental procedures   aspirin EC 325 MG tablet Take 1 tablet (325 mg total) by mouth daily.   Black Cohosh 540 MG CAPS Take 540 mg by mouth daily.   clonazePAM (KLONOPIN) 1 MG tablet Take 1 tablet (1 mg total) by mouth 2 (two) times daily as needed for anxiety   Collagen-Vitamin C-Biotin (COLLAGEN PO) Take 1 Scoop by mouth daily.   Creatine POWD Take 1 Scoop by mouth daily.   estradiol (ESTRACE VAGINAL) 0.1 MG/GM vaginal cream Place 1 Applicatorful vaginally 3 (three) times a week.   Fe Fum-Vit C-Vit B12-FA  (TRIGELS-F FORTE) CAPS capsule Take 1 capsule by mouth daily. May substitute with similar generic medication   fexofenadine (ALLEGRA) 180 MG tablet Take 180 mg by mouth daily as needed for allergies or rhinitis.   MAGNESIUM CITRATE PO Take 405 mg by mouth daily.   midodrine (PROAMATINE) 5 MG tablet Take 3 tablets (15 mg total) by mouth 3 (three) times daily with meals.   Misc Natural Products (JOINT SUPPORT COMPLEX PO) Take 4 capsules by mouth daily.   Omega-3 Fatty Acids (FISH OIL PO) Take 813 mg by mouth daily.   Prenatal Vit-Fe Fumarate-FA (PRENATAL PO) Take 3 capsules by mouth daily.   sertraline (ZOLOFT) 100 MG tablet Take one tablet (100 mg dose) by mouth at bedtime.   tirzepatide (ZEPBOUND) 15 MG/0.5ML Pen Inject 15 mg into the skin once a week.   traMADol (ULTRAM) 50 MG tablet Take 1 tablet (50 mg total) by mouth every 6 (six) hours as needed for moderate pain (pain score 4-6).   Vitamin D-Vitamin K (VITAMIN D2 + K1 PO) Take 2 drops by mouth daily. Liquid   Physical Exam:    VS:  BP 108/67 (BP Location: Right Arm, Patient Position: Sitting, Cuff Size: Normal)   Pulse (!) 57   Ht 5\' 6"  (1.676 m)   Wt 155 lb (70.3 kg)   LMP 09/05/2020 (Exact Date)   SpO2 95%   BMI 25.02 kg/m    Wt Readings from Last 3 Encounters:  08/02/23 155 lb (70.3 kg)  07/26/23 154 lb (69.9 kg)  07/15/23 160 lb 15 oz (73 kg)    GEN: Well nourished, well developed in no acute distress NECK: No JVD; No carotid bruits CARDIAC: RRR, no murmurs, rubs, gallops RESPIRATORY:  Clear to auscultation without rales, wheezing or rhonchi  ABDOMEN: Soft, non-tender, non-distended EXTREMITIES:  No edema; No acute deformity   Asessement and Plan:Marland Kitchen    Mitral valve regurgitation/prolapse: S/p mitral valve repair utilizing a 38 mm stimulus semirigid band and edge-to-edge repair with Dr. Leafy Dyer on 07/09/23. Today she reports that she is slowly improving, her shortness of breath is improving.  She continues to have some  sternal pain although this is improved as well.  Her midsternal incision and chest tube sites are healing well, no evidence of infection.  Will defer approval for starting of cardiac rehab to cardiac surgery.  Discussed the use of prophylactic antibiotics before dental work. Prescription given for Amoxicillin 2 grams orally one hour before procedure. Repeat echo scheduled for 08/20/23. Check CMET and CBC. Continue aspirin and midodrine.  Hypotension: Patient had postoperative vasoplegia, requiring  initiation of midodrine. Blood pressure today 108/67.  She reports that her dizziness improved following discontinuation of Lasix.  Given softer blood pressure today will continue midodrine.  Pleural effusions: Chest x-ray on 07/26/2023 indicated trace pleural effusions.  Given dizziness and low blood pressures her Lasix has been held.  She notes slow improvement in her breathing.  Will continue midodrine for now, she has follow-up with Dr. Leafy Dyer on 08/19/2023 with chest x-ray at that time.    Cardiac Rehabilitation Eligibility Assessment  The patient is ready to start cardiac rehabilitation pending clearance from the cardiac surgeon.    Disposition: F/u with Dr. Leafy Dyer on 08/19/23 and Dr. Wyline Dyer in two months.   Signed, Rip Harbour, NP

## 2023-08-02 ENCOUNTER — Encounter: Payer: Self-pay | Admitting: Cardiology

## 2023-08-02 ENCOUNTER — Other Ambulatory Visit (HOSPITAL_BASED_OUTPATIENT_CLINIC_OR_DEPARTMENT_OTHER): Payer: Self-pay

## 2023-08-02 ENCOUNTER — Ambulatory Visit: Payer: BC Managed Care – PPO | Attending: Cardiology | Admitting: Cardiology

## 2023-08-02 VITALS — BP 108/67 | HR 57 | Ht 66.0 in | Wt 155.0 lb

## 2023-08-02 DIAGNOSIS — J90 Pleural effusion, not elsewhere classified: Secondary | ICD-10-CM | POA: Diagnosis not present

## 2023-08-02 DIAGNOSIS — I959 Hypotension, unspecified: Secondary | ICD-10-CM

## 2023-08-02 DIAGNOSIS — I341 Nonrheumatic mitral (valve) prolapse: Secondary | ICD-10-CM

## 2023-08-02 DIAGNOSIS — Z9889 Other specified postprocedural states: Secondary | ICD-10-CM | POA: Diagnosis not present

## 2023-08-02 LAB — CBC

## 2023-08-02 MED ORDER — AMOXICILLIN 500 MG PO CAPS
ORAL_CAPSULE | ORAL | 2 refills | Status: AC
  Filled 2023-08-02: qty 25, 30d supply, fill #0
  Filled 2023-08-27: qty 25, 5d supply, fill #0

## 2023-08-02 MED ORDER — CLONAZEPAM 1 MG PO TABS
1.0000 mg | ORAL_TABLET | Freq: Two times a day (BID) | ORAL | 0 refills | Status: DC | PRN
  Filled 2023-08-02 – 2023-08-27 (×2): qty 60, 30d supply, fill #0

## 2023-08-02 NOTE — Patient Instructions (Addendum)
Medication Instructions:  Take Amoxicillin 2 gram 1 hour prior to dental procedures. *If you need a refill on your cardiac medications before your next appointment, please call your pharmacy*  Lab Work: Today we will draw CBC and Cmet If you have labs (blood work) drawn today and your tests are completely normal, you will receive your results only by: MyChart Message (if you have MyChart) OR A paper copy in the mail If you have any lab test that is abnormal or we need to change your treatment, we will call you to review the results.  Testing/Procedures: No testing  Follow-Up: At [facility], you and your health needs are our priority.  As part of our continuing mission to provide you with exceptional heart care, we have created designated Provider Care Teams.  These Care Teams include your primary Cardiologist (physician) and Advanced Practice Providers (APPs -  Physician Assistants and Nurse Practitioners) who all work together to provide you with the care you need, when you need it.  We recommend signing up for the patient portal called "MyChart".  Sign up information is provided on this After Visit Summary.  MyChart is used to connect with patients for Virtual Visits (Telemedicine).  Patients are able to view lab/test results, encounter notes, upcoming appointments, etc.  Non-urgent messages can be sent to your provider as well.   To learn more about what you can do with MyChart, go to [email or web address].    Your next appointment:   8-10 week(s)  Provider:   Maisie Fus, MD

## 2023-08-03 ENCOUNTER — Telehealth: Payer: Self-pay

## 2023-08-03 LAB — COMPREHENSIVE METABOLIC PANEL
ALT: 26 [IU]/L (ref 0–32)
AST: 32 [IU]/L (ref 0–40)
Albumin: 4.3 g/dL (ref 3.8–4.9)
Alkaline Phosphatase: 157 [IU]/L — ABNORMAL HIGH (ref 44–121)
BUN/Creatinine Ratio: 18 (ref 9–23)
BUN: 14 mg/dL (ref 6–24)
Bilirubin Total: <0.2 mg/dL (ref 0.0–1.2)
CO2: 21 mmol/L (ref 20–29)
Calcium: 9.6 mg/dL (ref 8.7–10.2)
Chloride: 104 mmol/L (ref 96–106)
Creatinine, Ser: 0.76 mg/dL (ref 0.57–1.00)
Globulin, Total: 1.9 g/dL (ref 1.5–4.5)
Glucose: 77 mg/dL (ref 70–99)
Potassium: 4.7 mmol/L (ref 3.5–5.2)
Sodium: 140 mmol/L (ref 134–144)
Total Protein: 6.2 g/dL (ref 6.0–8.5)
eGFR: 94 mL/min/{1.73_m2} (ref >59)

## 2023-08-03 LAB — CBC
Hematocrit: 36.6 % (ref 34.0–46.6)
Hemoglobin: 11.6 g/dL (ref 11.1–15.9)
MCH: 30.4 pg (ref 26.6–33.0)
MCHC: 31.7 g/dL (ref 31.5–35.7)
MCV: 96 fL (ref 79–97)
Platelets: 374 10*3/uL (ref 150–450)
RBC: 3.81 x10E6/uL (ref 3.77–5.28)
RDW: 13.5 % (ref 11.7–15.4)
WBC: 5.6 10*3/uL (ref 3.4–10.8)

## 2023-08-03 NOTE — Telephone Encounter (Signed)
-----   Message from Katlyn D West sent at 08/03/2023  1:22 PM EST ----- Please let Tracy Dyer know that her CBC shows no evidence of anemia nor infection.  Her kidney function and electrolytes are normal. Her liver function is overall normal, her alkaline phosphatase is slightly elevated, we will monitor this in the future. Overall good results!

## 2023-08-03 NOTE — Telephone Encounter (Signed)
Called patient advised of below they verbalized understanding.

## 2023-08-06 ENCOUNTER — Other Ambulatory Visit: Payer: Self-pay | Admitting: Physician Assistant

## 2023-08-10 ENCOUNTER — Ambulatory Visit: Payer: BC Managed Care – PPO | Admitting: Internal Medicine

## 2023-08-12 ENCOUNTER — Other Ambulatory Visit (HOSPITAL_BASED_OUTPATIENT_CLINIC_OR_DEPARTMENT_OTHER): Payer: Self-pay

## 2023-08-16 ENCOUNTER — Other Ambulatory Visit: Payer: Self-pay | Admitting: Thoracic Surgery (Cardiothoracic Vascular Surgery)

## 2023-08-16 DIAGNOSIS — I341 Nonrheumatic mitral (valve) prolapse: Secondary | ICD-10-CM

## 2023-08-18 NOTE — Progress Notes (Signed)
[address]       [place] [address]             [phone]           Tracy Dyer [facility] Medical Record #[id number] Date of Birth: [birth year]  Tracy Fus, MD Tracy Pagan, NP  Chief Complaint:   follow up MV repair  History of Present Illness:     Pt now 7 weeks out from MV repair. Still with some soreness in chest but improving. Has dropped to one midodrine a day. Has started driving      Past Medical History:  Diagnosis Date   Anxiety    Arthritis    Bilateral Knees   Blood dyscrasia    MTHFR   Cancer (HCC)    Left Breast   Depression    Family history of breast cancer    Family history of lung cancer    Family history of ovarian cancer    GERD (gastroesophageal reflux disease)    diet controlled   Heart murmur    History of radiation therapy    Left breast, left axilla- 05/27/21-07/10/21- Dr. Antony Blackbird   Pneumonia    Seasonal allergies    Stroke (HCC) 1990   r/t birth control. no deficits    Past Surgical History:  Procedure Laterality Date   BREAST BIOPSY Left 2019   x 2 biopsy   BREAST EXCISIONAL BIOPSY Left    BREAST LUMPECTOMY WITH RADIOACTIVE SEED AND SENTINEL LYMPH NODE BIOPSY Left 02/26/2021   Procedure: LEFT BREAST LUMPECTOMY WITH RADIOACTIVE SEED x2; LEFT AXILLARY SENTINEL NODE BIOPSY;  Surgeon: Emelia Loron, MD;  Location: [facility];  Service: General;  Laterality: Left;   BREAST LUMPECTOMY WITH RADIOFREQUENCY TAG IDENTIFICATION Left 04/18/2021   Procedure: LEFT BREAST MRI WIRE GUIDED EXCISION;  Surgeon: Emelia Loron, MD;  Location: [facility];  Service: General;  Laterality: Left;   BREAST REDUCTION SURGERY Bilateral 04/18/2021   Procedure: MAMMARY REDUCTION  (BREAST) LEFT ONCOPLASTIC BREAST RECONSTRUCTION, RIGHT BREAST REDUCTION;  Surgeon: Glenna Fellows, MD;  Location: [facility];  Service: Plastics;  Laterality: Bilateral;   CHOLECYSTECTOMY   2008   MITRAL VALVE REPAIR N/A 07/09/2023   Procedure: MITRAL VALVE REPAIR USING SIMULUS ANNULOPLASTY BAND;  Surgeon: [name], MD;  Location: [facility] OR;  Service: Open Heart Surgery;  Laterality: N/A;   PORT-A-CATH REMOVAL Right 04/18/2021   Procedure: REMOVAL PORT-A-CATH;  Surgeon: Emelia Loron, MD;  Location: [facility];  Service: General;  Laterality: Right;   PORTACATH PLACEMENT N/A 09/05/2020   Procedure: INSERTION PORT-A-CATH;  Surgeon: Emelia Loron, MD;  Location: [facility];  Service: General;  Laterality: N/A;  PEC BLOCK   RIGHT/LEFT HEART CATH AND CORONARY ANGIOGRAPHY N/A 05/28/2023   Procedure: RIGHT/LEFT HEART CATH AND CORONARY ANGIOGRAPHY;  Surgeon: [name], MD;  Location: [facility] INVASIVE CV LAB;  Service: Cardiovascular;  Laterality: N/A;   TEE WITHOUT CARDIOVERSION N/A 05/14/2023   Procedure: TRANSESOPHAGEAL ECHOCARDIOGRAM;  Surgeon: Tracy Fus, MD;  Location: [facility] INVASIVE CV LAB;  Service: Cardiovascular;  Laterality: N/A;   TEE WITHOUT CARDIOVERSION N/A 07/09/2023   Procedure: TRANSESOPHAGEAL ECHOCARDIOGRAM;  Surgeon: [name], MD;  Location: [facility] OR;  Service: Open Heart Surgery;  Laterality: N/A;   WISDOM TOOTH EXTRACTION      Social History   Tobacco Use  Smoking Status Never  Smokeless Tobacco Never    Social History   Substance and  Sexual Activity  Alcohol Use Not Currently    Social History   Socioeconomic History   Marital status: Married    Spouse name: Not on file   Number of children: Not on file   Years of education: Not on file   Highest education level: Not on file  Occupational History   Not on file  Tobacco Use   Smoking status: Never   Smokeless tobacco: Never  Vaping Use   Vaping status: Never Used  Substance and Sexual Activity   Alcohol use: Not Currently   Drug use: Never   Sexual activity: Yes    Birth control/protection: None  Other Topics Concern   Not on file  Social History Narrative   Not  on file   Social Drivers of Health   Financial Resource Strain: Patient Declined (08/14/2022)   Received from [facility]   Overall Financial Resource Strain (CARDIA)    Difficulty of Paying Living Expenses: Patient declined  Food Insecurity: No Food Insecurity (07/09/2023)   Hunger Vital Sign    Worried About Running Out of Food in the Last Year: Never true    Ran Out of Food in the Last Year: Never true  Transportation Needs: No Transportation Needs (07/09/2023)   PRAPARE - Administrator, Civil Service (Medical): No    Lack of Transportation (Non-Medical): No  Physical Activity: Unknown (04/18/2023)   Received from [facility]   Exercise Vital Sign    Days of Exercise per Week: Patient declined    Minutes of Exercise per Session: 80 min  Stress: Patient Declined (04/18/2023)   Received from [facility] - Occupational Stress Questionnaire    Feeling of Stress : Patient declined  Social Connections: Moderately Integrated (04/18/2023)   Received from [facility]   Social Network    How would you rate your social network (family, work, friends)?: Adequate participation with social networks  Intimate Partner Violence: Not At Risk (07/09/2023)   Humiliation, Afraid, Rape, and Kick questionnaire    Fear of Current or Ex-Partner: No    Emotionally Abused: No    Physically Abused: No    Sexually Abused: No    Allergies  Allergen Reactions   Sulfa Antibiotics     Rash, urticarial   Propoxyphene Anxiety, Rash and Other (See Comments)   Sulfamethoxazole Rash    Feel sun burn    Current Outpatient Medications  Medication Sig Dispense Refill   amoxicillin (AMOXIL) 500 MG capsule Take 4 capsules once as needed 1 hour prior to dental procedures 25 capsule 2   aspirin EC 325 MG tablet Take 1 tablet (325 mg total) by mouth daily.     Black Cohosh 540 MG CAPS Take 540 mg by mouth daily.     clonazePAM (KLONOPIN)  1 MG tablet Take 1 tablet (1 mg total) by mouth 2 (two) times daily as needed for anxiety 60 tablet 0   Collagen-Vitamin C-Biotin (COLLAGEN PO) Take 1 Scoop by mouth daily.     Creatine POWD Take 1 Scoop by mouth daily.     estradiol (ESTRACE VAGINAL) 0.1 MG/GM vaginal cream Place 1 Applicatorful vaginally 3 (three) times a week. 42.5 g 12   Fe Fum-Vit C-Vit B12-FA (TRIGELS-F FORTE) CAPS capsule Take 1 capsule by mouth daily. May substitute with similar generic medication     fexofenadine (ALLEGRA) 180 MG tablet Take 180 mg by mouth daily as needed for allergies or rhinitis.  furosemide (LASIX) 20 MG tablet Take 1 tablet (20 mg total) by mouth daily. Take for 10 days then stop. If you develop dizziness please stop the medication and call [facility] office. (Patient not taking: Reported on 08/02/2023) 10 tablet 0   MAGNESIUM CITRATE PO Take 405 mg by mouth daily.     midodrine (PROAMATINE) 5 MG tablet Take 3 tablets (15 mg total) by mouth 3 (three) times daily with meals. 270 tablet 1   Misc Natural Products (JOINT SUPPORT COMPLEX PO) Take 4 capsules by mouth daily.     Omega-3 Fatty Acids (FISH OIL PO) Take 813 mg by mouth daily.     potassium chloride SA (KLOR-CON M) 10 MEQ tablet Take 1 tablet (10 mEq total) by mouth daily. Take for 10 days then stop. Please stop taking if you stop taking Lasix (Patient not taking: Reported on 08/02/2023) 10 tablet 0   Prenatal Vit-Fe Fumarate-FA (PRENATAL PO) Take 3 capsules by mouth daily.     sertraline (ZOLOFT) 100 MG tablet Take one tablet (100 mg dose) by mouth at bedtime. 90 tablet 1   tirzepatide (ZEPBOUND) 15 MG/0.5ML Pen Inject 15 mg into the skin once a week. 6 mL 0   traMADol (ULTRAM) 50 MG tablet Take 1 tablet (50 mg total) by mouth every 6 (six) hours as needed for moderate pain (pain score 4-6). 28 tablet 0   Vitamin D-Vitamin K (VITAMIN D2 + K1 PO) Take 2 drops by mouth daily. Liquid     No current facility-administered medications for this visit.      Family History  Problem Relation Age of Onset   Eczema Father    Allergic rhinitis Brother    Breast cancer Mother        in 50's   Cancer Maternal Uncle        unk type   Lung cancer Maternal Grandfather 42   Heart Problems Paternal Grandfather    Melanoma Cousin    Ovarian cancer Paternal Great-grandmother    Cancer Paternal Great-grandmother        GYN cancer, possibly ovarian?   Cancer Maternal Aunt        half aunts/uncles x5- rare cancers       Physical Exam: LMP 09/05/2020 (Exact Date)  Lungs: clear Card: RR with no murmur Ext: no edema Neuro: intact    Diagnostic Studies & Laboratory data: I have personally reviewed the following studies and agree with the findings     Recent Radiology Findings:   CXR today completely clear lung fields    Recent Lab Findings: Lab Results  Component Value Date   WBC 5.6 08/02/2023   HGB 11.6 08/02/2023   HCT 36.6 08/02/2023   PLT 374 08/02/2023   GLUCOSE 77 08/02/2023   ALT 26 08/02/2023   AST 32 08/02/2023   NA 140 08/02/2023   K 4.7 08/02/2023   CL 104 08/02/2023   CREATININE 0.76 08/02/2023   BUN 14 08/02/2023   CO2 21 08/02/2023   TSH 1.588 03/19/2022   INR 1.4 (H) 07/09/2023   HGBA1C 4.7 (L) 07/07/2023      Assessment / Plan:     SP MV repair. Doing well Will have cardiac rehab reach out to start next class. Stop midodrine. ASA 81mg  a day. For echo tomorrow. She is very happy with experience   I have spent 30 min in review of the records, viewing studies and in face to face with patient and in coordination of future care  [name] 08/18/2023 3:04 PM

## 2023-08-19 ENCOUNTER — Other Ambulatory Visit: Payer: Self-pay

## 2023-08-19 ENCOUNTER — Encounter: Payer: Self-pay | Admitting: Thoracic Surgery (Cardiothoracic Vascular Surgery)

## 2023-08-19 ENCOUNTER — Ambulatory Visit: Payer: BC Managed Care – PPO | Admitting: Thoracic Surgery (Cardiothoracic Vascular Surgery)

## 2023-08-19 ENCOUNTER — Ambulatory Visit
Admission: RE | Admit: 2023-08-19 | Discharge: 2023-08-19 | Disposition: A | Discharge status: Home / Self Care | Payer: BC Managed Care – PPO | Source: Ambulatory Visit | Attending: Thoracic Surgery (Cardiothoracic Vascular Surgery) | Admitting: Thoracic Surgery (Cardiothoracic Vascular Surgery)

## 2023-08-19 ENCOUNTER — Ambulatory Visit (INDEPENDENT_AMBULATORY_CARE_PROVIDER_SITE_OTHER): Payer: Self-pay | Admitting: Thoracic Surgery (Cardiothoracic Vascular Surgery)

## 2023-08-19 VITALS — BP 120/69 | HR 83 | Resp 20 | Ht 66.0 in | Wt 155.0 lb

## 2023-08-19 DIAGNOSIS — Z952 Presence of prosthetic heart valve: Secondary | ICD-10-CM | POA: Diagnosis not present

## 2023-08-19 DIAGNOSIS — Z9889 Other specified postprocedural states: Secondary | ICD-10-CM

## 2023-08-19 DIAGNOSIS — I341 Nonrheumatic mitral (valve) prolapse: Secondary | ICD-10-CM

## 2023-08-19 MED ORDER — ASPIRIN 81 MG PO TBEC: 81.0000 mg | DELAYED_RELEASE_TABLET | Freq: Every day | ORAL | 1 refills | Status: AC

## 2023-08-19 NOTE — Progress Notes (Signed)
Referral placed to outpatient Cardiac Rehab per Dr. Karolee Ohs orders.

## 2023-08-20 ENCOUNTER — Ambulatory Visit (HOSPITAL_COMMUNITY): Payer: BC Managed Care – PPO | Attending: Cardiology

## 2023-08-20 DIAGNOSIS — I341 Nonrheumatic mitral (valve) prolapse: Secondary | ICD-10-CM | POA: Diagnosis not present

## 2023-08-20 DIAGNOSIS — Z9889 Other specified postprocedural states: Secondary | ICD-10-CM | POA: Insufficient documentation

## 2023-08-20 LAB — ECHOCARDIOGRAM COMPLETE
Area-P 1/2: 2.99 cm2
MV VTI: 2.3 cm2
S' Lateral: 2.4 cm

## 2023-08-24 ENCOUNTER — Encounter: Payer: Self-pay | Admitting: Internal Medicine

## 2023-08-25 ENCOUNTER — Telehealth (HOSPITAL_COMMUNITY): Payer: Self-pay

## 2023-08-25 NOTE — Telephone Encounter (Signed)
Called patient to see if she was interested in participating in the Cardiac Rehab Program. Patient stated yes. Patient will come in for orientation on 08/26/23 @ 830AM and will attend the 815AM exercise class. Went over insurance, patient verbalized understanding.   Pensions consultant.

## 2023-08-25 NOTE — Telephone Encounter (Signed)
Pt insurance is active and benefits verified through [employer]. Co-pay $0.00, DED $1,000.00/$0.00 met, out of pocket $3,500.00/$0.00 met, co-insurance 20%. No pre-authorization required. Passport, 08/25/23 @ 1:59PM, REF#[id number]   How many CR sessions are covered? (36 visits for TCR, 72 visits for ICR)72 Is this a lifetime maximum or an annual maximum? Annual Has the member used any of these services to date? No Is there a time limit (weeks/months) on start of program and/or program completion? No

## 2023-08-26 ENCOUNTER — Telehealth (HOSPITAL_COMMUNITY): Payer: Self-pay

## 2023-08-26 ENCOUNTER — Inpatient Hospital Stay (HOSPITAL_COMMUNITY): Admission: RE | Admit: 2023-08-26 | Payer: BC Managed Care – PPO | Source: Ambulatory Visit

## 2023-08-26 NOTE — Telephone Encounter (Signed)
Pt called and stated she will not be able to complete CR orientation today, she woke up with a sore throat. She will call back when she is ready to reschedule.

## 2023-08-27 ENCOUNTER — Other Ambulatory Visit: Payer: Self-pay

## 2023-08-27 ENCOUNTER — Other Ambulatory Visit (HOSPITAL_BASED_OUTPATIENT_CLINIC_OR_DEPARTMENT_OTHER): Payer: Self-pay

## 2023-08-30 ENCOUNTER — Other Ambulatory Visit (HOSPITAL_BASED_OUTPATIENT_CLINIC_OR_DEPARTMENT_OTHER): Payer: Self-pay

## 2023-08-30 ENCOUNTER — Ambulatory Visit (HOSPITAL_COMMUNITY): Payer: BC Managed Care – PPO

## 2023-08-31 ENCOUNTER — Other Ambulatory Visit (HOSPITAL_BASED_OUTPATIENT_CLINIC_OR_DEPARTMENT_OTHER): Payer: Self-pay

## 2023-08-31 ENCOUNTER — Other Ambulatory Visit: Payer: Self-pay | Admitting: Adult Health

## 2023-08-31 DIAGNOSIS — Z1231 Encounter for screening mammogram for malignant neoplasm of breast: Secondary | ICD-10-CM

## 2023-09-01 ENCOUNTER — Telehealth (HOSPITAL_COMMUNITY): Payer: Self-pay

## 2023-09-01 ENCOUNTER — Ambulatory Visit (HOSPITAL_COMMUNITY): Payer: BC Managed Care – PPO

## 2023-09-01 DIAGNOSIS — R519 Headache, unspecified: Secondary | ICD-10-CM | POA: Diagnosis not present

## 2023-09-01 DIAGNOSIS — M47892 Other spondylosis, cervical region: Secondary | ICD-10-CM | POA: Diagnosis not present

## 2023-09-01 DIAGNOSIS — H8113 Benign paroxysmal vertigo, bilateral: Secondary | ICD-10-CM | POA: Diagnosis not present

## 2023-09-01 DIAGNOSIS — R278 Other lack of coordination: Secondary | ICD-10-CM | POA: Diagnosis not present

## 2023-09-01 NOTE — Telephone Encounter (Signed)
Called and spoke with pt in regards to rescheduling CR, pt stated she is not interested in rescheduling at this time due to "the flu and everything else going around".   Closed referral

## 2023-09-03 ENCOUNTER — Ambulatory Visit (HOSPITAL_COMMUNITY): Payer: BC Managed Care – PPO

## 2023-09-06 ENCOUNTER — Ambulatory Visit (HOSPITAL_COMMUNITY): Payer: BC Managed Care – PPO

## 2023-09-06 DIAGNOSIS — M7541 Impingement syndrome of right shoulder: Secondary | ICD-10-CM | POA: Diagnosis not present

## 2023-09-06 DIAGNOSIS — M9902 Segmental and somatic dysfunction of thoracic region: Secondary | ICD-10-CM | POA: Diagnosis not present

## 2023-09-06 DIAGNOSIS — M9901 Segmental and somatic dysfunction of cervical region: Secondary | ICD-10-CM | POA: Diagnosis not present

## 2023-09-06 DIAGNOSIS — M9907 Segmental and somatic dysfunction of upper extremity: Secondary | ICD-10-CM | POA: Diagnosis not present

## 2023-09-06 DIAGNOSIS — M9903 Segmental and somatic dysfunction of lumbar region: Secondary | ICD-10-CM | POA: Diagnosis not present

## 2023-09-07 ENCOUNTER — Other Ambulatory Visit: Payer: Self-pay | Admitting: *Deleted

## 2023-09-07 DIAGNOSIS — Z170 Estrogen receptor positive status [ER+]: Secondary | ICD-10-CM

## 2023-09-07 NOTE — Progress Notes (Signed)
Renewal orders placed for [employer] testing.

## 2023-09-08 ENCOUNTER — Ambulatory Visit (HOSPITAL_COMMUNITY): Payer: BC Managed Care – PPO

## 2023-09-10 ENCOUNTER — Ambulatory Visit (HOSPITAL_COMMUNITY): Payer: BC Managed Care – PPO

## 2023-09-10 ENCOUNTER — Encounter: Payer: Self-pay | Admitting: Internal Medicine

## 2023-09-13 ENCOUNTER — Ambulatory Visit (HOSPITAL_COMMUNITY): Payer: BC Managed Care – PPO

## 2023-09-13 ENCOUNTER — Other Ambulatory Visit: Payer: Self-pay | Admitting: Internal Medicine

## 2023-09-13 ENCOUNTER — Other Ambulatory Visit (HOSPITAL_COMMUNITY): Payer: Self-pay

## 2023-09-13 DIAGNOSIS — R002 Palpitations: Secondary | ICD-10-CM

## 2023-09-13 MED ORDER — PROPRANOLOL HCL 20 MG PO TABS
20.0000 mg | ORAL_TABLET | Freq: Three times a day (TID) | ORAL | 3 refills | Status: DC | PRN
Start: 2023-09-13 — End: 2023-12-10
  Filled 2023-09-13: qty 90, 30d supply, fill #0

## 2023-09-15 ENCOUNTER — Ambulatory Visit (HOSPITAL_COMMUNITY): Payer: BC Managed Care – PPO

## 2023-09-15 DIAGNOSIS — M9907 Segmental and somatic dysfunction of upper extremity: Secondary | ICD-10-CM | POA: Diagnosis not present

## 2023-09-15 DIAGNOSIS — M7541 Impingement syndrome of right shoulder: Secondary | ICD-10-CM | POA: Diagnosis not present

## 2023-09-15 DIAGNOSIS — M9902 Segmental and somatic dysfunction of thoracic region: Secondary | ICD-10-CM | POA: Diagnosis not present

## 2023-09-15 DIAGNOSIS — M9903 Segmental and somatic dysfunction of lumbar region: Secondary | ICD-10-CM | POA: Diagnosis not present

## 2023-09-15 DIAGNOSIS — M9901 Segmental and somatic dysfunction of cervical region: Secondary | ICD-10-CM | POA: Diagnosis not present

## 2023-09-17 ENCOUNTER — Ambulatory Visit (HOSPITAL_COMMUNITY): Payer: BC Managed Care – PPO

## 2023-09-20 ENCOUNTER — Telehealth: Payer: Self-pay

## 2023-09-20 ENCOUNTER — Ambulatory Visit (HOSPITAL_COMMUNITY): Payer: BC Managed Care – PPO

## 2023-09-20 DIAGNOSIS — Z170 Estrogen receptor positive status [ER+]: Secondary | ICD-10-CM | POA: Diagnosis not present

## 2023-09-20 DIAGNOSIS — C50412 Malignant neoplasm of upper-outer quadrant of left female breast: Secondary | ICD-10-CM | POA: Diagnosis not present

## 2023-09-20 NOTE — Telephone Encounter (Signed)
   Pre-operative Risk Assessment    Patient Name: Tracy Dyer  DOB: 17-Mar-1971 MRN: [id number]   Date of last office visit: 08/02/23 Date of next office visit: 10/04/23   Request for Surgical Clearance    Procedure:   Cleanings, exams   Date of Surgery:  Clearance TBD                                 Surgeon:  Karlene Einstein, DDS  Surgeon's Group or Practice Name:   Phone number:  [phone] Fax number:  [phone]   Type of Clearance Requested:   - Medical  - Pharmacy:  Hold Aspirin Not indicated    Type of Anesthesia:  Not Indicated   Additional requests/questions:  Patient had a cleaning scheduled this past Friday but was canceled due to patient open heart surgery on 07/09/23 office wants to know if patient needs to wait a certain amount of time before going in for a cleaning.   Vance Peper   09/20/2023, 3:25 PM

## 2023-09-21 DIAGNOSIS — L821 Other seborrheic keratosis: Secondary | ICD-10-CM | POA: Diagnosis not present

## 2023-09-21 DIAGNOSIS — L811 Chloasma: Secondary | ICD-10-CM | POA: Diagnosis not present

## 2023-09-21 DIAGNOSIS — D225 Melanocytic nevi of trunk: Secondary | ICD-10-CM | POA: Diagnosis not present

## 2023-09-21 DIAGNOSIS — L814 Other melanin hyperpigmentation: Secondary | ICD-10-CM | POA: Diagnosis not present

## 2023-09-22 ENCOUNTER — Ambulatory Visit (HOSPITAL_COMMUNITY): Payer: BC Managed Care – PPO

## 2023-09-22 ENCOUNTER — Other Ambulatory Visit (HOSPITAL_COMMUNITY): Payer: Self-pay

## 2023-09-22 DIAGNOSIS — M7541 Impingement syndrome of right shoulder: Secondary | ICD-10-CM | POA: Diagnosis not present

## 2023-09-22 DIAGNOSIS — M9907 Segmental and somatic dysfunction of upper extremity: Secondary | ICD-10-CM | POA: Diagnosis not present

## 2023-09-22 DIAGNOSIS — M9903 Segmental and somatic dysfunction of lumbar region: Secondary | ICD-10-CM | POA: Diagnosis not present

## 2023-09-22 DIAGNOSIS — M9902 Segmental and somatic dysfunction of thoracic region: Secondary | ICD-10-CM | POA: Diagnosis not present

## 2023-09-22 DIAGNOSIS — M9901 Segmental and somatic dysfunction of cervical region: Secondary | ICD-10-CM | POA: Diagnosis not present

## 2023-09-22 NOTE — Telephone Encounter (Signed)
    Primary Cardiologist: Maisie Fus, MD  Chart reviewed as part of pre-operative protocol coverage. Simple dental extractions are considered low risk procedures per guidelines and generally do not require any specific cardiac clearance. It is also generally accepted that for simple extractions and dental cleanings, there is no need to interrupt blood thinner therapy.   SBE prophylaxis is required for the patient.  She has an active prescription for amoxicillin.  I will route this recommendation to the requesting party via Epic fax function and remove from pre-op pool.  Please call with questions.  Sharlene Dory, PA-C 09/22/2023, 10:22 AM

## 2023-09-24 ENCOUNTER — Ambulatory Visit (HOSPITAL_COMMUNITY): Payer: BC Managed Care – PPO

## 2023-09-25 LAB — SIGNATERA ONLY (NATERA MANAGED)
SIGNATERA MTM READOUT: 0 MTM/ml
SIGNATERA TEST RESULT: NEGATIVE

## 2023-09-27 ENCOUNTER — Ambulatory Visit (HOSPITAL_COMMUNITY): Payer: BC Managed Care – PPO

## 2023-09-28 ENCOUNTER — Encounter: Payer: Self-pay | Admitting: *Deleted

## 2023-09-28 ENCOUNTER — Encounter: Payer: Self-pay | Admitting: Thoracic Surgery (Cardiothoracic Vascular Surgery)

## 2023-09-28 DIAGNOSIS — M9901 Segmental and somatic dysfunction of cervical region: Secondary | ICD-10-CM | POA: Diagnosis not present

## 2023-09-28 DIAGNOSIS — M9902 Segmental and somatic dysfunction of thoracic region: Secondary | ICD-10-CM | POA: Diagnosis not present

## 2023-09-28 DIAGNOSIS — M9907 Segmental and somatic dysfunction of upper extremity: Secondary | ICD-10-CM | POA: Diagnosis not present

## 2023-09-28 DIAGNOSIS — M9903 Segmental and somatic dysfunction of lumbar region: Secondary | ICD-10-CM | POA: Diagnosis not present

## 2023-09-28 DIAGNOSIS — M7541 Impingement syndrome of right shoulder: Secondary | ICD-10-CM | POA: Diagnosis not present

## 2023-09-29 ENCOUNTER — Ambulatory Visit (HOSPITAL_COMMUNITY): Payer: BC Managed Care – PPO

## 2023-09-29 ENCOUNTER — Telehealth: Payer: Self-pay

## 2023-09-29 NOTE — Telephone Encounter (Signed)
 Called pt per MD to advise [employer] testing was negative/not detected and that [facility] will be in touch to schedule 3 mo repeat lab. LVM for call back to advise.

## 2023-09-29 NOTE — Telephone Encounter (Signed)
 Pt returned previous call and was given results. She verbalized thanks and understanding.

## 2023-10-01 ENCOUNTER — Ambulatory Visit (HOSPITAL_COMMUNITY): Payer: BC Managed Care – PPO

## 2023-10-04 ENCOUNTER — Ambulatory Visit: Payer: BC Managed Care – PPO | Attending: Internal Medicine | Admitting: Internal Medicine

## 2023-10-04 ENCOUNTER — Ambulatory Visit
Admission: RE | Admit: 2023-10-04 | Discharge: 2023-10-04 | Disposition: A | Discharge status: Home / Self Care | Source: Ambulatory Visit | Attending: Adult Health | Admitting: Adult Health

## 2023-10-04 ENCOUNTER — Ambulatory Visit: Payer: BC Managed Care – PPO

## 2023-10-04 ENCOUNTER — Encounter: Payer: Self-pay | Admitting: Internal Medicine

## 2023-10-04 ENCOUNTER — Ambulatory Visit (HOSPITAL_COMMUNITY): Payer: BC Managed Care – PPO

## 2023-10-04 VITALS — BP 94/55 | HR 66 | Ht 66.0 in | Wt 154.4 lb

## 2023-10-04 DIAGNOSIS — I341 Nonrheumatic mitral (valve) prolapse: Secondary | ICD-10-CM

## 2023-10-04 DIAGNOSIS — Z1231 Encounter for screening mammogram for malignant neoplasm of breast: Secondary | ICD-10-CM | POA: Diagnosis not present

## 2023-10-04 NOTE — Patient Instructions (Signed)
 Medication Instructions:  Your physician recommends that you continue on your current medications as directed. Please refer to the Current Medication list given to you today.  *If you need a refill on your cardiac medications before your next appointment, please call your pharmacy*   Follow-Up: At [facility], you and your health needs are our priority.  As part of our continuing mission to provide you with exceptional heart care, we have created designated Provider Care Teams.  These Care Teams include your primary Cardiologist (physician) and Advanced Practice Providers (APPs -  Physician Assistants and Nurse Practitioners) who all work together to provide you with the care you need, when you need it.   Your next appointment:   6 month(s)  Provider:   Theodore Demark, PA-C, Edd Fabian, FNP, Micah Flesher, PA-C, Marjie Skiff, PA-C, Robet Leu, PA-C, Juanda Crumble, PA-C, Joni Reining, DNP, ANP, Azalee Course, PA-C, Bernadene Person, NP, or Reather Littler, NP

## 2023-10-04 NOTE — Progress Notes (Unsigned)
 Cardiology Office Note:  .   Date:  10/04/2023  ID:  Tracy Dyer, DOB Feb 21, 1971, MRN [id number] PCP: Tracy Pagan, NP  [facility] Providers Cardiologist:  Tracy Fus, MD    History of Present Illness: .   Tracy Dyer is a 53 y.o. female hx of stroke at 53y/o  MTHFR associated with birth control, patient of Tracy Dyer.  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 S/p AC. She has hx of MVP with a myxomatous mitral valve. She had mild MR in August 30865. Otherwise normal heart function. She was seen by Tracy Dyer recently and she noted a more pronounced murmur. Tracy Dyer was also having palpitations and was very concerned. She underwent a TTE and her MR has progressed and she may have flail leaflet. Today, she said she was in [place] for work. She had an acute episode of feeling nauseous. She has felt disoriented. She was diagnosed with COVID. She said her chest feels heavy. She notes heart racing at night. No PND/orthopnea  Cardio-Onc Hx - Anthracycline therapy - Y dose 220 mg/m2 - Herceptin- N - Radiation L breast - Gy 50 Gy, unknown MHD - ICI- N - TKI/VEGF- N -5 FU- N   ROS:  per HPI otherwise negative  Interim hx 10/06/2023 She is status post MV repair with Dr. Leafy Dyer. She noted pain post-op. Her Bps have been low. She was on midodrine, but this was stopped. It did not help. She is hydrating well.    Studies Reviewed: Marland Kitchen       TTE 08/20/2023 EF is normal. RV is mildly reduced in fxn. No PHTN.Normal repair with annuloplasty ring, trivial MR. MV gradient of  LHC/RHC 05/28/2023 1.  Normal right dominant coronary circulation. 2.  Fick cardiac output of 6 L/min and Fick cardiac index of 3.4 L/min with the following hemodynamics:             Right atrial pressure mean of 9 mmHg             Right ventricular pressure 27/7 with end-diastolic pressure of 12 mmHg             Wedge pressure mean of 11 mmHg with V waves to 12 mmHg              Pulmonary artery pressure 27/11 with a mean of 18 mmHg             PVR of 1.2 Woods units             PA pulsatility index of 1.8 3.  LVEDP of 14 mmHg:   Recommendation: Continue evaluation for mitral valve intervention.  TTE 05/14/2023 1. Left ventricular ejection fraction, by estimation, is 60 to 65%. The  left ventricle has normal function.   2. Right ventricular systolic function is normal. The right ventricular  size is normal.   3. No left atrial/left atrial appendage thrombus was detected.   4. There are multiple jets, EROA may be underestimating the severity of  regurgitation. 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   5. The aortic valve is normal in structure. Aortic valve regurgitation is  not visualized.   Conclusion(s)/Recommendation(s): Referral sent to Dr. Leafy Dyer.   TTE 05/05/2023 Normal LV/RV function No pulmonary HTN  Left atrial size was moderately dilated.   Native mitral valve is myxomatous, with posterior leaflet prolapse (but flail component  cannot be ruled out image 70 and 73), mild to moderate mitral valve regurgitation directed anteriorly and late peaking, no evidence of mitral stenosis.  Aortic Valve is normal  6. The inferior vena cava is normal in size with <50% respiratory  variability, suggesting right atrial pressure of 8 mmHg.   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   Wt Readings from Last 3 Encounters:  08/19/23 155 lb (70.3 kg)  08/02/23 155 lb (70.3 kg)  07/26/23 154 lb (69.9 kg)    Physical Exam Gen: well appearing Neuro: alert and oriented CV:  r,r,r 2/6 holosystolic ejection murmur sternal border to the apex  Pulm: CLAB Abd: non distended Ext: No LE edema Skin: warm and well perfused Psych: normal mood   ASSESSMENT AND PLAN: .   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  Status post mitral valve repair with Dr. Leafy Dyer On 07/09/2023 -For dental procedure she does need SBE -Follow-up echo in January, 17 2025 showed normal prosthetic annuloplasty ring - she is otherwise doing well clinically -Continue surveillance  Cardio-Oncology CVD Risk: has MVP Cardiotoxic Risk: Low Potential Cardiotoxic therapy: AC, < 240 mg/m2. Did have L breast xrt, however not calcified valves and typically radiation induced valve disease takes many years as well   Surveillance:Clinical    Dispo: Follow up in 6 months  Signed, Tracy Dyer, Tracy Spittle, MD

## 2023-10-06 ENCOUNTER — Ambulatory Visit (HOSPITAL_COMMUNITY): Payer: BC Managed Care – PPO

## 2023-10-08 ENCOUNTER — Ambulatory Visit (HOSPITAL_COMMUNITY): Payer: BC Managed Care – PPO

## 2023-10-11 ENCOUNTER — Ambulatory Visit (HOSPITAL_COMMUNITY): Payer: BC Managed Care – PPO

## 2023-10-13 ENCOUNTER — Ambulatory Visit (HOSPITAL_COMMUNITY): Payer: BC Managed Care – PPO

## 2023-10-15 ENCOUNTER — Ambulatory Visit (HOSPITAL_COMMUNITY): Payer: BC Managed Care – PPO

## 2023-10-18 ENCOUNTER — Ambulatory Visit (HOSPITAL_COMMUNITY): Payer: BC Managed Care – PPO

## 2023-10-20 ENCOUNTER — Ambulatory Visit (HOSPITAL_COMMUNITY): Payer: BC Managed Care – PPO

## 2023-10-22 ENCOUNTER — Ambulatory Visit (HOSPITAL_COMMUNITY): Payer: BC Managed Care – PPO

## 2023-10-25 ENCOUNTER — Ambulatory Visit (HOSPITAL_COMMUNITY): Payer: BC Managed Care – PPO

## 2023-10-27 ENCOUNTER — Ambulatory Visit (HOSPITAL_COMMUNITY): Payer: BC Managed Care – PPO

## 2023-10-29 ENCOUNTER — Ambulatory Visit (HOSPITAL_COMMUNITY): Payer: BC Managed Care – PPO

## 2023-11-01 ENCOUNTER — Ambulatory Visit (HOSPITAL_COMMUNITY): Payer: BC Managed Care – PPO

## 2023-11-03 ENCOUNTER — Ambulatory Visit (HOSPITAL_COMMUNITY): Payer: BC Managed Care – PPO

## 2023-11-04 ENCOUNTER — Encounter (HOSPITAL_BASED_OUTPATIENT_CLINIC_OR_DEPARTMENT_OTHER): Payer: Self-pay

## 2023-11-04 ENCOUNTER — Other Ambulatory Visit (HOSPITAL_BASED_OUTPATIENT_CLINIC_OR_DEPARTMENT_OTHER): Payer: Self-pay

## 2023-11-05 ENCOUNTER — Other Ambulatory Visit (HOSPITAL_BASED_OUTPATIENT_CLINIC_OR_DEPARTMENT_OTHER): Payer: Self-pay

## 2023-11-05 ENCOUNTER — Ambulatory Visit (HOSPITAL_COMMUNITY): Payer: BC Managed Care – PPO

## 2023-11-05 ENCOUNTER — Encounter (HOSPITAL_BASED_OUTPATIENT_CLINIC_OR_DEPARTMENT_OTHER): Payer: Self-pay

## 2023-11-05 MED ORDER — CLONAZEPAM 1 MG PO TABS
1.0000 mg | ORAL_TABLET | Freq: Two times a day (BID) | ORAL | 0 refills | Status: DC
  Filled 2023-11-05: qty 60, 30d supply, fill #0

## 2023-11-08 ENCOUNTER — Ambulatory Visit (HOSPITAL_COMMUNITY): Payer: BC Managed Care – PPO

## 2023-11-09 ENCOUNTER — Other Ambulatory Visit (HOSPITAL_BASED_OUTPATIENT_CLINIC_OR_DEPARTMENT_OTHER): Payer: Self-pay

## 2023-11-09 ENCOUNTER — Other Ambulatory Visit: Payer: Self-pay

## 2023-11-09 MED ORDER — ZEPBOUND 15 MG/0.5ML ~~LOC~~ SOAJ
15.0000 mg | SUBCUTANEOUS | 0 refills | Status: AC
  Filled 2023-11-09: qty 2, 28d supply, fill #0
  Filled 2024-01-12: qty 2, 28d supply, fill #1

## 2023-11-10 ENCOUNTER — Ambulatory Visit (HOSPITAL_COMMUNITY): Payer: BC Managed Care – PPO

## 2023-11-12 ENCOUNTER — Ambulatory Visit (HOSPITAL_COMMUNITY): Payer: BC Managed Care – PPO

## 2023-11-15 ENCOUNTER — Ambulatory Visit (HOSPITAL_COMMUNITY): Payer: BC Managed Care – PPO

## 2023-11-17 ENCOUNTER — Ambulatory Visit (HOSPITAL_COMMUNITY): Payer: BC Managed Care – PPO

## 2023-12-06 ENCOUNTER — Other Ambulatory Visit: Payer: Self-pay

## 2023-12-06 DIAGNOSIS — C50412 Malignant neoplasm of upper-outer quadrant of left female breast: Secondary | ICD-10-CM

## 2023-12-07 ENCOUNTER — Inpatient Hospital Stay (HOSPITAL_BASED_OUTPATIENT_CLINIC_OR_DEPARTMENT_OTHER): Payer: BC Managed Care – PPO | Admitting: Adult Health

## 2023-12-07 ENCOUNTER — Ambulatory Visit (HOSPITAL_COMMUNITY)
Admission: RE | Admit: 2023-12-07 | Discharge: 2023-12-07 | Disposition: A | Discharge status: Home / Self Care | Source: Ambulatory Visit | Attending: Adult Health | Admitting: Adult Health

## 2023-12-07 ENCOUNTER — Encounter: Payer: Self-pay | Admitting: Adult Health

## 2023-12-07 ENCOUNTER — Inpatient Hospital Stay: Payer: BC Managed Care – PPO | Attending: Adult Health

## 2023-12-07 VITALS — BP 101/59 | HR 57 | Temp 98.0°F | Resp 18 | Ht 66.0 in | Wt 151.4 lb

## 2023-12-07 DIAGNOSIS — C50412 Malignant neoplasm of upper-outer quadrant of left female breast: Secondary | ICD-10-CM | POA: Diagnosis not present

## 2023-12-07 DIAGNOSIS — Z801 Family history of malignant neoplasm of trachea, bronchus and lung: Secondary | ICD-10-CM | POA: Insufficient documentation

## 2023-12-07 DIAGNOSIS — Z1721 Progesterone receptor positive status: Secondary | ICD-10-CM | POA: Insufficient documentation

## 2023-12-07 DIAGNOSIS — Z170 Estrogen receptor positive status [ER+]: Secondary | ICD-10-CM | POA: Insufficient documentation

## 2023-12-07 DIAGNOSIS — M549 Dorsalgia, unspecified: Secondary | ICD-10-CM | POA: Diagnosis not present

## 2023-12-07 DIAGNOSIS — Z79811 Long term (current) use of aromatase inhibitors: Secondary | ICD-10-CM | POA: Diagnosis not present

## 2023-12-07 DIAGNOSIS — Z923 Personal history of irradiation: Secondary | ICD-10-CM | POA: Diagnosis not present

## 2023-12-07 DIAGNOSIS — Z9221 Personal history of antineoplastic chemotherapy: Secondary | ICD-10-CM | POA: Diagnosis not present

## 2023-12-07 DIAGNOSIS — M47814 Spondylosis without myelopathy or radiculopathy, thoracic region: Secondary | ICD-10-CM | POA: Diagnosis not present

## 2023-12-07 DIAGNOSIS — Z8041 Family history of malignant neoplasm of ovary: Secondary | ICD-10-CM | POA: Diagnosis not present

## 2023-12-07 DIAGNOSIS — Z1732 Human epidermal growth factor receptor 2 negative status: Secondary | ICD-10-CM | POA: Insufficient documentation

## 2023-12-07 DIAGNOSIS — Z803 Family history of malignant neoplasm of breast: Secondary | ICD-10-CM | POA: Insufficient documentation

## 2023-12-07 LAB — CBC WITH DIFFERENTIAL (CANCER CENTER ONLY)
Abs Immature Granulocytes: 0.01 10*3/uL (ref 0.00–0.07)
Basophils Absolute: 0 10*3/uL (ref 0.0–0.1)
Basophils Relative: 1 %
Eosinophils Absolute: 0.1 10*3/uL (ref 0.0–0.5)
Eosinophils Relative: 2 %
HCT: 39.5 % (ref 36.0–46.0)
Hemoglobin: 14.1 g/dL (ref 12.0–15.0)
Immature Granulocytes: 0 %
Lymphocytes Relative: 36 %
Lymphs Abs: 1.5 10*3/uL (ref 0.7–4.0)
MCH: 30.8 pg (ref 26.0–34.0)
MCHC: 35.7 g/dL (ref 30.0–36.0)
MCV: 86.2 fL (ref 80.0–100.0)
Monocytes Absolute: 0.3 10*3/uL (ref 0.1–1.0)
Monocytes Relative: 8 %
Neutro Abs: 2.3 10*3/uL (ref 1.7–7.7)
Neutrophils Relative %: 53 %
Platelet Count: 188 10*3/uL (ref 150–400)
RBC: 4.58 MIL/uL (ref 3.87–5.11)
RDW: 15 % (ref 11.5–15.5)
WBC Count: 4.3 10*3/uL (ref 4.0–10.5)
nRBC: 0 % (ref 0.0–0.2)

## 2023-12-07 LAB — CMP (CANCER CENTER ONLY)
ALT: 17 U/L (ref 0–44)
AST: 24 U/L (ref 15–41)
Albumin: 4.3 g/dL (ref 3.5–5.0)
Alkaline Phosphatase: 63 U/L (ref 38–126)
Anion gap: 4 — ABNORMAL LOW (ref 5–15)
BUN: 12 mg/dL (ref 6–20)
CO2: 28 mmol/L (ref 22–32)
Calcium: 9 mg/dL (ref 8.9–10.3)
Chloride: 105 mmol/L (ref 98–111)
Creatinine: 0.8 mg/dL (ref 0.44–1.00)
GFR, Estimated: >60 mL/min (ref >60)
Glucose, Bld: 96 mg/dL (ref 70–99)
Potassium: 4.4 mmol/L (ref 3.5–5.1)
Sodium: 137 mmol/L (ref 135–145)
Total Bilirubin: 0.5 mg/dL (ref 0.0–1.2)
Total Protein: 6.7 g/dL (ref 6.5–8.1)

## 2023-12-07 NOTE — Progress Notes (Signed)
 [facility] Cancer Follow up:    Tracy Furlough, NP [address] [place] [address]   DIAGNOSIS:  Cancer Staging  Malignant neoplasm of upper-outer quadrant of left breast in female, estrogen receptor positive (HCC) Staging form: Breast, AJCC 8th Edition - Clinical stage from 08/21/2020: Stage IIA (cT1c, cN1, cM0, G3, ER+, PR+, HER2-) - Signed by Cameron Cea, MD on 08/21/2020 - Pathologic stage from 02/26/2021: No Stage Recommended (ypT0, pN0, cM0) - Signed by Percival Brace, NP on 12/04/2021 Stage prefix: Post-therapy    SUMMARY OF ONCOLOGIC HISTORY: Oncology History  Malignant neoplasm of upper-outer quadrant of left breast in female, estrogen receptor positive (HCC)  08/21/2020 Initial Diagnosis   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%, PR 95%, HER2 equivocal, FISH pending, Ki-67 20%   08/21/2020 Cancer Staging   Staging form: Breast, AJCC 8th Edition - Clinical stage from 08/21/2020: Stage IIA (cT1c, cN1, cM0, G3, ER+, PR+, HER2-) - Signed by Cameron Cea, MD on 08/21/2020   09/04/2020 Genetic Testing   Negative genetic testing. No pathogenic variants identified on the [facility] Breast Cancer STAT Panel + Multi-Cancer Panel. VUS in [facility] called c.983C>T identified. The report date is 09/04/2020.   The STAT Breast cancer panel offered by [facility] includes sequencing and rearrangement analysis for the following 9 genes:  ATM, BRCA1, BRCA2, CDH1, CHEK2, PALB2, PTEN, STK11 and TP53.    The Multi-Cancer Panel offered by [facility] includes sequencing and/or deletion duplication testing of the following 85 genes: AIP, ALK, APC, ATM, AXIN2,BAP1,  BARD1, BLM, BMPR1A,  BRCA1, BRCA2, BRIP1, CASR, CDC73, CDH1, CDK4, CDKN1B, CDKN1C, CDKN2A (p14ARF), CDKN2A (p16INK4a), CEBPA, CHEK2, CTNNA1, DICER1, DIS3L2, EGFR (c.2369C>T, p.Thr790Met variant only), EPCAM (Deletion/duplication testing only), FH, FLCN, GATA2, GPC3, GREM1 (Promoter region deletion/duplication testing only), HOXB13 (c.251G>A, p.Gly84Glu), HRAS, KIT, MAX, MEN1, MET, MITF (c.952G>A, p.Glu318Lys variant only), MLH1, MSH2, MSH3, MSH6, MUTYH, NBN, NF1, NF2, NTHL1, PALB2, PDGFRA, PHOX2B, PMS2, POLD1, POLE, POT1, PRKAR1A, PTCH1, PTEN, RAD50, RAD51C, RAD51D, RB1, RECQL4, RET, RNF43, RUNX1, SDHAF2, SDHA (sequence changes only), SDHB, SDHC, SDHD, SMAD4, SMARCA4, SMARCB1, SMARCE1, STK11, SUFU, TERC, TERT, TMEM127, TP53, TSC1, TSC2, VHL, WRN and WT1.    09/06/2020 - 01/17/2021 Chemotherapy   AC X 4 foll by Taxol        02/26/2021 Surgery   Left lumpectomy: no residual carcinoma with margins and axillary lymph nodes also negative for carcinoma     02/26/2021 Cancer Staging   Staging form: Breast, AJCC 8th Edition - Pathologic stage from 02/26/2021: No Stage Recommended (ypT0, pN0, cM0) - Signed by Percival Brace, NP on 12/04/2021 Stage prefix: Post-therapy   04/18/2021 Surgery   Removal of retained clip, port removal and bilateral breast mammoplasty    05/27/2021 - 07/10/2021 Radiation Therapy    Site Technique Total Dose (Gy) Dose per Fx (Gy) Completed Fx Beam Energies  Breast, Left: Breast_Lt 3D 50.4/50.4 1.8 28/28 6X, 10X  Axilla, Left: Axilla_Lt 3D 50.4/50.4 1.8 28/28 6X, 15X     08/2021 -  Anti-estrogen oral therapy   Unable to tolerate tamoxifen or letrozole , changed to exemestane  daily 09/18/21     CURRENT THERAPY: observation  INTERVAL HISTORY:  Discussed the use of AI scribe software for clinical note transcription with  the patient, who gave verbal consent to proceed.  Tracy Dyer 53 y.o. female returns for follow-up of her history of breast cancer.  She continues on observation alone.   Her most recent mammogram occurred October 07, 2023 and demonstrated no evidence of malignancy and breast density category B.  SHe continues to undergo Signatera testing every 6 months which has been negative.  Since her last visit with us  she lost her job while she was on medical leave after undergoing heart surgery for valve repair.  She continues to recover from surgery and is exercising regularly.  Her cardiac surgeon and cardiologist are no longer with the system, she wonders if she could be seen by Dr. Julane Ny.  She also notes a persistent nagging back pain in her mid thoracic spine that has been going on for a few weeks.  She is unsure what to make of this.  Patient Active Problem List   Diagnosis Date Noted   S/P MVR (mitral valve repair) 07/09/2023   Vitamin D  deficiency 05/04/2023   Right lower quadrant pain 05/04/2023   Squamous cell skin cancer 04/19/2023   Cervical radiculopathy 07/08/2022   Occipital neuralgia of right side 07/01/2021   Peripheral vertigo 07/01/2021   Genetic testing 09/04/2020   Family history of breast cancer    Family history of ovarian cancer    Family history of lung cancer    Malignant neoplasm of upper-outer quadrant of left breast in female, estrogen receptor positive (HCC) 08/21/2020   Headache 04/10/2017   Anxiety 11/16/2013   Hypercoagulable state (HCC) 11/16/2013   TIA (transient ischemic attack) 11/15/2013   Chest discomfort 01/20/2012   Factor V Leiden (HCC) 01/20/2012   MVP (mitral valve prolapse) 01/20/2012   Acute bronchitis 12/23/2011    is allergic to sulfa antibiotics, propoxyphene, and sulfamethoxazole.  MEDICAL HISTORY: Past Medical History:  Diagnosis Date   Anxiety    Arthritis    Bilateral Knees   Blood dyscrasia    MTHFR   Cancer (HCC)    Left Breast   Depression    Family history of breast cancer    Family history of lung cancer    Family history of ovarian cancer    GERD (gastroesophageal reflux disease)    diet  controlled   Heart murmur    History of radiation therapy    Left breast, left axilla- 05/27/21-07/10/21- Dr. Retta Caster   Pneumonia    Seasonal allergies    Stroke (HCC) 1990   r/t birth control. no deficits    SURGICAL HISTORY: Past Surgical History:  Procedure Laterality Date   BREAST BIOPSY Left 2019   x 2 biopsy   BREAST EXCISIONAL BIOPSY Left    BREAST LUMPECTOMY WITH RADIOACTIVE SEED AND SENTINEL LYMPH NODE BIOPSY Left 02/26/2021   Procedure: LEFT BREAST LUMPECTOMY WITH RADIOACTIVE SEED x2; LEFT AXILLARY SENTINEL NODE BIOPSY;  Surgeon: Enid Harry, MD;  Location: [facility];  Service: General;  Laterality: Left;   BREAST LUMPECTOMY WITH RADIOFREQUENCY TAG IDENTIFICATION Left 04/18/2021   Procedure: LEFT BREAST MRI WIRE GUIDED EXCISION;  Surgeon: Enid Harry, MD;  Location: [facility];  Service: General;  Laterality: Left;   BREAST REDUCTION SURGERY Bilateral 04/18/2021   Procedure: MAMMARY REDUCTION  (BREAST) LEFT ONCOPLASTIC BREAST RECONSTRUCTION, RIGHT BREAST REDUCTION;  Surgeon: Alger Infield, MD;  Location:  SURGERY CENTER;  Service: Plastics;  Laterality: Bilateral;   CHOLECYSTECTOMY  2008   MITRAL VALVE REPAIR N/A 07/09/2023   Procedure: MITRAL VALVE REPAIR  USING SIMULUS ANNULOPLASTY BAND;  Surgeon: Melene Sportsman, MD;  Location: [facility] OR;  Service: Open Heart Surgery;  Laterality: N/A;   PORT-A-CATH REMOVAL Right 04/18/2021   Procedure: REMOVAL PORT-A-CATH;  Surgeon: Enid Harry, MD;  Location: [facility];  Service: General;  Laterality: Right;   PORTACATH PLACEMENT N/A 09/05/2020   Procedure: INSERTION PORT-A-CATH;  Surgeon: Enid Harry, MD;  Location: [facility] OR;  Service: General;  Laterality: N/A;  PEC BLOCK   RIGHT/LEFT HEART CATH AND CORONARY ANGIOGRAPHY N/A 05/28/2023   Procedure: RIGHT/LEFT HEART CATH AND CORONARY ANGIOGRAPHY;  Surgeon: Kyra Phy, MD;  Location: [facility] INVASIVE CV LAB;   Service: Cardiovascular;  Laterality: N/A;   TEE WITHOUT CARDIOVERSION N/A 05/14/2023   Procedure: TRANSESOPHAGEAL ECHOCARDIOGRAM;  Surgeon: Bridgette Campus, MD;  Location: [facility] INVASIVE CV LAB;  Service: Cardiovascular;  Laterality: N/A;   TEE WITHOUT CARDIOVERSION N/A 07/09/2023   Procedure: TRANSESOPHAGEAL ECHOCARDIOGRAM;  Surgeon: Melene Sportsman, MD;  Location: [facility] OR;  Service: Open Heart Surgery;  Laterality: N/A;   WISDOM TOOTH EXTRACTION      SOCIAL HISTORY: Social History   Socioeconomic History   Marital status: Married    Spouse name: Not on file   Number of children: Not on file   Years of education: Not on file   Highest education level: Not on file  Occupational History   Not on file  Tobacco Use   Smoking status: Never   Smokeless tobacco: Never  Vaping Use   Vaping status: Never Used  Substance and Sexual Activity   Alcohol use: Not Currently   Drug use: Never   Sexual activity: Yes    Partners: Male    Birth control/protection: None    Comment: MARRIED  Other Topics Concern   Not on file  Social History Narrative   Not on file   Social Drivers of Health   Financial Resource Strain: Patient Declined (08/14/2022)   Received from [facility]   Overall Financial Resource Strain (CARDIA)    Difficulty of Paying Living Expenses: Patient declined  Food Insecurity: No Food Insecurity (07/09/2023)   Hunger Vital Sign    Worried About Running Out of Food in the Last Year: Never true    Ran Out of Food in the Last Year: Never true  Transportation Needs: No Transportation Needs (07/09/2023)   PRAPARE - Administrator, Civil Service (Medical): No    Lack of Transportation (Non-Medical): No  Physical Activity: Unknown (04/18/2023)   Received from [facility]   Exercise Vital Sign    Days of Exercise per Week: Patient declined    Minutes of Exercise per Session: 80 min  Stress: Patient Declined (04/18/2023)   Received from [facility] - Occupational Stress Questionnaire    Feeling of Stress : Patient declined  Social Connections: Moderately Integrated (04/18/2023)   Received from [facility]   Social Network    How would you rate your social network (family, work, friends)?: Adequate participation with social networks  Intimate Partner Violence: Not At Risk (07/09/2023)   Humiliation, Afraid, Rape, and Kick questionnaire    Fear of Current or Ex-Partner: No    Emotionally Abused: No    Physically Abused: No    Sexually Abused: No    FAMILY HISTORY: Family History  Problem Relation Age of Onset   Eczema Father    Allergic rhinitis Brother    Breast cancer Mother  in 50's   Cancer Maternal Uncle        unk type   Lung cancer Maternal Grandfather 42   Heart Problems Paternal Grandfather    Melanoma Cousin    Ovarian cancer Paternal Great-grandmother    Cancer Paternal Great-grandmother        GYN cancer, possibly ovarian?   Cancer Maternal Aunt        half aunts/uncles x5- rare cancers    Review of Systems  Constitutional:  Negative for appetite change, chills, fatigue, fever and unexpected weight change.  HENT:   Negative for hearing loss, lump/mass and trouble swallowing.   Eyes:  Negative for eye problems and icterus.  Respiratory:  Negative for chest tightness, cough and shortness of breath.   Cardiovascular:  Negative for chest pain, leg swelling and palpitations.  Gastrointestinal:  Negative for abdominal distention, abdominal pain, constipation, diarrhea, nausea and vomiting.  Endocrine: Negative for hot flashes.  Genitourinary:  Negative for difficulty urinating.   Musculoskeletal:  Negative for arthralgias.  Skin:  Negative for itching and rash.  Neurological:  Negative for dizziness, extremity weakness, headaches and numbness.  Hematological:  Negative for adenopathy. Does not bruise/bleed easily.  Psychiatric/Behavioral:  Negative for depression.  The patient is not nervous/anxious.       PHYSICAL EXAMINATION    Vitals:   12/07/23 1427  BP: (!) 101/59  Pulse: (!) 57  Resp: 18  Temp: 98 F (36.7 C)  SpO2: 100%    Physical Exam Constitutional:      General: She is not in acute distress.    Appearance: Normal appearance. She is not toxic-appearing.  HENT:     Head: Normocephalic and atraumatic.     Mouth/Throat:     Mouth: Mucous membranes are moist.     Pharynx: Oropharynx is clear. No oropharyngeal exudate or posterior oropharyngeal erythema.  Eyes:     General: No scleral icterus. Cardiovascular:     Rate and Rhythm: Normal rate and regular rhythm.     Pulses: Normal pulses.     Heart sounds: Normal heart sounds.  Pulmonary:     Effort: Pulmonary effort is normal.     Breath sounds: Normal breath sounds.  Chest:     Comments: Right breast status post reduction, benign left breast status post reduction, lumpectomy and radiation no sign of local recurrence. Abdominal:     General: Abdomen is flat. Bowel sounds are normal. There is no distension.     Palpations: Abdomen is soft.     Tenderness: There is no abdominal tenderness.  Musculoskeletal:        General: No swelling.     Cervical back: Neck supple.  Lymphadenopathy:     Cervical: No cervical adenopathy.     Upper Body:     Right upper body: No supraclavicular or axillary adenopathy.     Left upper body: No supraclavicular or axillary adenopathy.  Skin:    General: Skin is warm and dry.     Findings: No rash.  Neurological:     General: No focal deficit present.     Mental Status: She is alert.  Psychiatric:        Mood and Affect: Mood normal.        Behavior: Behavior normal.     LABORATORY DATA:  CBC    Component Value Date/Time   WBC 4.3 12/07/2023 1354   WBC 4.3 07/15/2023 0253   RBC 4.58 12/07/2023 1354   HGB 14.1 12/07/2023 1354  HGB 11.6 08/02/2023 0959   HCT 39.5 12/07/2023 1354   HCT 36.6 08/02/2023 0959   PLT 188  12/07/2023 1354   PLT 374 08/02/2023 0959   MCV 86.2 12/07/2023 1354   MCV 96 08/02/2023 0959   MCH 30.8 12/07/2023 1354   MCHC 35.7 12/07/2023 1354   RDW 15.0 12/07/2023 1354   RDW 13.5 08/02/2023 0959   LYMPHSABS 1.5 12/07/2023 1354   MONOABS 0.3 12/07/2023 1354   EOSABS 0.1 12/07/2023 1354   BASOSABS 0.0 12/07/2023 1354    CMP     Component Value Date/Time   NA 137 12/07/2023 1354   NA 140 08/02/2023 0959   K 4.4 12/07/2023 1354   CL 105 12/07/2023 1354   CO2 28 12/07/2023 1354   GLUCOSE 96 12/07/2023 1354   BUN 12 12/07/2023 1354   BUN 14 08/02/2023 0959   CREATININE 0.80 12/07/2023 1354   CALCIUM 9.0 12/07/2023 1354   PROT 6.7 12/07/2023 1354   PROT 6.2 08/02/2023 0959   ALBUMIN  4.3 12/07/2023 1354   ALBUMIN  4.3 08/02/2023 0959   AST 24 12/07/2023 1354   ALT 17 12/07/2023 1354   ALKPHOS 63 12/07/2023 1354   BILITOT 0.5 12/07/2023 1354   GFRNONAA >60 12/07/2023 1354     ASSESSMENT and THERAPY PLAN:   Malignant neoplasm of upper-outer quadrant of left breast in female, estrogen receptor positive (HCC) 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  Neoadjuvant chemotherapy with dose dense Adriamycin  and Cytoxan  followed by Taxol  weekly x12 completed 01/17/2021 2. 02/26/2021 breast conserving surgery with sentinel lymph node and targeted node dissection,: Path CR 0/4 LN Neg 04/18/21: Bilateral Mammoplasty:  Benign 3.  Adjuvant radiation therapy 05/28/21- 07/10/21 4.  Followed by adjuvant antiestrogen therapy with tamoxifen--unable to tolerate, on Letrozole  daily switched to exemestane  discontinued for intolerance -------------------------------------------------------------------------------------------------------------------------  PET CT 10/01/21: No evidence of metastatic disease postradiation changes in the lungs Based on severe intolerance antiestrogen therapy we decided not to pursue any further antiestrogen treatments. Signatera testing 03/28/2023 negative   Breast cancer surveillance: Signatera testing Mammogram alternating with breast MRI  She has no clinical or radiographic signs of breast cancer recurrence.  Will continue with annual mammograms and Signatera testing.   We will obtain x-rays of the thoracic spine to evaluate this back pain she is experiencing.  I sent a message to Dr. Julane Ny inquiring if he can accept her as a patient or if he had a different recommendation.  I recommended continued healthy diet and exercise and for her to continue to follow-up with her primary care provider regularly.  RTC in 6 months for labs and f/u.     All questions were answered. The patient knows to call the clinic with any problems, questions or concerns. We can certainly see the patient much sooner if necessary.  Total encounter time:30 minutes*in face-to-face visit time, chart review, lab review, care coordination, order entry, and documentation of the encounter time.    Alwin Baars, NP  12/07/23 3:52 PM Medical Oncology and Hematology [facility] [address] [place], [place] [address] Tel. [phone]    Fax. [phone]  *Total Encounter Time as defined by the Centers for Medicare and Medicaid Services includes, in addition to the face-to-face time of a patient visit (documented in the note above) non-face-to-face time: obtaining and reviewing outside  history, ordering and reviewing medications, tests or procedures, care coordination (communications with other health care professionals or caregivers) and documentation in the medical record.

## 2023-12-07 NOTE — Assessment & Plan Note (Signed)
 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  Neoadjuvant chemotherapy with dose dense Adriamycin  and Cytoxan  followed by Taxol  weekly x12 completed 01/17/2021 2. 02/26/2021 breast conserving surgery with sentinel lymph node and targeted node dissection,: Path CR 0/4 LN Neg 04/18/21: Bilateral Mammoplasty: Benign 3.  Adjuvant radiation therapy 05/28/21- 07/10/21 4.  Followed by adjuvant antiestrogen therapy with tamoxifen--unable to tolerate, on Letrozole  daily switched to exemestane  discontinued for intolerance -------------------------------------------------------------------------------------------------------------------------  PET CT 10/01/21: No evidence of metastatic disease postradiation changes in the lungs Based on severe intolerance antiestrogen therapy we decided not to pursue any further antiestrogen treatments. Signatera testing 03/28/2023 negative   Breast cancer surveillance: Signatera testing Mammogram alternating with breast MRI  She has no clinical or radiographic signs of breast cancer recurrence.  Will continue with annual mammograms and Signatera testing.   We will obtain x-rays of the thoracic spine to evaluate this back pain she is experiencing.  I sent a message to Dr. Julane Ny inquiring if he can accept her as a patient or if he had a different recommendation.  I recommended continued healthy diet and exercise  and for her to continue to follow-up with her primary care provider regularly.  RTC in 6 months for labs and f/u.

## 2023-12-08 ENCOUNTER — Telehealth: Payer: Self-pay | Admitting: Adult Health

## 2023-12-08 NOTE — Telephone Encounter (Signed)
 Confirmed with pt scheduled appt date and time

## 2023-12-10 ENCOUNTER — Encounter (HOSPITAL_COMMUNITY): Payer: Self-pay | Admitting: Internal Medicine

## 2023-12-10 ENCOUNTER — Other Ambulatory Visit (HOSPITAL_COMMUNITY): Payer: Self-pay | Admitting: Internal Medicine

## 2023-12-10 ENCOUNTER — Inpatient Hospital Stay (HOSPITAL_COMMUNITY)
Admission: RE | Admit: 2023-12-10 | Discharge: 2023-12-10 | Disposition: A | Discharge status: Home / Self Care | Source: Ambulatory Visit | Attending: Internal Medicine | Admitting: Internal Medicine

## 2023-12-10 ENCOUNTER — Ambulatory Visit (HOSPITAL_COMMUNITY)
Admission: RE | Admit: 2023-12-10 | Discharge: 2023-12-10 | Disposition: A | Discharge status: Home / Self Care | Source: Ambulatory Visit | Attending: Internal Medicine | Admitting: Internal Medicine

## 2023-12-10 VITALS — BP 102/60 | HR 62 | Wt 153.6 lb

## 2023-12-10 DIAGNOSIS — Z923 Personal history of irradiation: Secondary | ICD-10-CM | POA: Insufficient documentation

## 2023-12-10 DIAGNOSIS — C50412 Malignant neoplasm of upper-outer quadrant of left female breast: Secondary | ICD-10-CM

## 2023-12-10 DIAGNOSIS — R55 Syncope and collapse: Secondary | ICD-10-CM

## 2023-12-10 DIAGNOSIS — Z803 Family history of malignant neoplasm of breast: Secondary | ICD-10-CM | POA: Insufficient documentation

## 2023-12-10 DIAGNOSIS — Z9889 Other specified postprocedural states: Secondary | ICD-10-CM

## 2023-12-10 DIAGNOSIS — I340 Nonrheumatic mitral (valve) insufficiency: Secondary | ICD-10-CM | POA: Diagnosis not present

## 2023-12-10 DIAGNOSIS — R410 Disorientation, unspecified: Secondary | ICD-10-CM | POA: Diagnosis not present

## 2023-12-10 DIAGNOSIS — I341 Nonrheumatic mitral (valve) prolapse: Secondary | ICD-10-CM | POA: Diagnosis not present

## 2023-12-10 DIAGNOSIS — R42 Dizziness and giddiness: Secondary | ICD-10-CM | POA: Diagnosis not present

## 2023-12-10 DIAGNOSIS — Z853 Personal history of malignant neoplasm of breast: Secondary | ICD-10-CM | POA: Diagnosis not present

## 2023-12-10 DIAGNOSIS — Z8673 Personal history of transient ischemic attack (TIA), and cerebral infarction without residual deficits: Secondary | ICD-10-CM | POA: Diagnosis not present

## 2023-12-10 DIAGNOSIS — Z170 Estrogen receptor positive status [ER+]: Secondary | ICD-10-CM

## 2023-12-10 DIAGNOSIS — F410 Panic disorder [episodic paroxysmal anxiety] without agoraphobia: Secondary | ICD-10-CM | POA: Insufficient documentation

## 2023-12-10 NOTE — Progress Notes (Signed)
 CARDIO-ONCOLOGY CLINIC CONSULT NOTE  Referring Physician: Tristan Furlough, NP Primary Care: Tristan Furlough, NP Primary Cardiologist: None  Chief Complaint: Presyncope  HPI:  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 ER/PR +, HER2 equivocal. 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 Did not go to [facility] because she had a hard time looking at the hospital due to mental trauma. Does resistance training and also cardio training with walking/skipping routine. Has been feeling ok during workouts.   Prior to surgery she had two episodes were she didn't know where she was. In one episode was in [place] and had a syncopal episode and someone caught her. She came to and then continued travelling but doesn't remember the next few hours. In 10/24 had another episode at work with disorientation, confusion and nausea. Came to ER.Brain MRI ok. ECG and vitals ok.   Had another episode of disorientation/nausea/confusion. Lasted a few hours Was seen in Oncology. HR in 50s SBP 101 Wears AppleWatch which tells her she has low HRV. No HR events on Watch during episode   Past Medical History:  Diagnosis Date   Anxiety    Arthritis    Bilateral Knees   Blood dyscrasia    MTHFR   Cancer (HCC)    Left Breast   Depression    Family history of breast cancer    Family history of lung cancer    Family history of ovarian cancer    GERD (gastroesophageal reflux disease)    diet controlled   Heart murmur     History of radiation therapy    Left breast, left axilla- 05/27/21-07/10/21- Dr. Retta Caster   Pneumonia    Seasonal allergies    Stroke (HCC) 1990   r/t birth control. no deficits    Current Outpatient Medications  Medication Sig Dispense Refill   amoxicillin  (AMOXIL ) 500 MG capsule Take 4 capsules by mouth once as needed 1 hour prior to dental procedures 25 capsule 2   aspirin  EC 81 MG tablet Take 1 tablet (81 mg total) by mouth daily. Swallow whole. 150 tablet 1   clonazePAM  (KLONOPIN ) 1 MG tablet Take 1 mg by mouth daily.     Collagen-Vitamin C-Biotin (COLLAGEN PO) Take 1 Scoop by mouth daily.     Creatine POWD Take 1 Scoop by mouth daily.     estradiol  (ESTRACE  VAGINAL) 0.1 MG/GM vaginal cream Place 1 Applicatorful vaginally 3 (three) times a week. 42.5 g 12   fexofenadine (ALLEGRA) 180 MG tablet Take 180 mg by mouth daily.     MAGNESIUM  CITRATE PO Take 405 mg by mouth daily.     Misc Natural Products (JOINT SUPPORT COMPLEX PO) Take 4 capsules by mouth daily.     Omega-3 Fatty Acids (FISH OIL PO) Take 813 mg by mouth daily.     sertraline  (ZOLOFT ) 100 MG tablet Take one tablet (100 mg dose) by mouth at bedtime. 90 tablet  1   tirzepatide  (ZEPBOUND ) 15 MG/0.5ML Pen Inject 15 mg into the skin once a week. 6 mL 0   traMADol  (ULTRAM ) 50 MG tablet Take 1 tablet (50 mg total) by mouth every 6 (six) hours as needed for moderate pain (pain score 4-6). 28 tablet 0   Vitamin D -Vitamin K (VITAMIN D2 + K1 PO) Take 2 drops by mouth daily. Liquid     No current facility-administered medications for this encounter.    Allergies  Allergen Reactions   Sulfa Antibiotics     Rash, urticarial   Propoxyphene Anxiety, Rash and Other (See Comments)   Sulfamethoxazole Rash    Feel sun burn      Social History   Socioeconomic History   Marital status: Married    Spouse name: Not on file   Number of children: Not on file   Years of education: Not on file   Highest education level: Not on file   Occupational History   Not on file  Tobacco Use   Smoking status: Never   Smokeless tobacco: Never  Vaping Use   Vaping status: Never Used  Substance and Sexual Activity   Alcohol use: Not Currently   Drug use: Never   Sexual activity: Yes    Partners: Male    Birth control/protection: None    Comment: MARRIED  Other Topics Concern   Not on file  Social History Narrative   Not on file   Social Drivers of Health   Financial Resource Strain: Patient Declined (08/14/2022)   Received from [facility], [facility]   Overall Financial Resource Strain (CARDIA)    Difficulty of Paying Living Expenses: Patient declined  Food Insecurity: No Food Insecurity (07/09/2023)   Hunger Vital Sign    Worried About Running Out of Food in the Last Year: Never true    Ran Out of Food in the Last Year: Never true  Transportation Needs: No Transportation Needs (07/09/2023)   PRAPARE - Administrator, Civil Service (Medical): No    Lack of Transportation (Non-Medical): No  Physical Activity: Unknown (04/18/2023)   Received from [facility]   Exercise Vital Sign    Days of Exercise per Week: Patient declined    Minutes of Exercise per Session: 80 min  Stress: Patient Declined (04/18/2023)   Received from [facility] - Occupational Stress Questionnaire    Feeling of Stress : Patient declined  Social Connections: Moderately Integrated (04/18/2023)   Received from [facility]   Social Network    How would you rate your social network (family, work, friends)?: Adequate participation with social networks  Intimate Partner Violence: Not At Risk (07/09/2023)   Humiliation, Afraid, Rape, and Kick questionnaire    Fear of Current or Ex-Partner: No    Emotionally Abused: No    Physically Abused: No    Sexually Abused: No      Family History  Problem Relation Age of Onset   Eczema Father    Allergic rhinitis Brother    Breast cancer  Mother        in 67's   Cancer Maternal Uncle        unk type   Lung cancer Maternal Grandfather 42   Heart Problems Paternal Grandfather    Melanoma Cousin    Ovarian cancer Paternal Great-grandmother    Cancer Paternal Great-grandmother        GYN cancer, possibly ovarian?   Cancer Maternal Aunt  half aunts/uncles x5- rare cancers    Vitals:   12/10/23 1118 12/10/23 1123  BP:  102/60  Pulse:  62  SpO2: 98% 97%  Weight: 69.7 kg (153 lb 9.6 oz)     PHYSICAL EXAM: General:  Well appearing. No respiratory difficulty HEENT: normal Neck: supple. no JVD. Carotids 2+ bilat; no bruits. No lymphadenopathy or thryomegaly appreciated. Cor: PMI nondisplaced. Regular rate & rhythm. No rubs, gallops or murmurs. Lungs: clear Abdomen: soft, nontender, nondistended. No hepatosplenomegaly. No bruits or masses. Good bowel sounds. Extremities: no cyanosis, clubbing, rash, edema Neuro: alert & oriented x 3, cranial nerves grossly intact. moves all 4 extremities w/o difficulty. Affect pleasant.but tearful at times  ECG: NSR 68 No ST-T wave abnormalities.    ASSESSMENT & PLAN:  1. Episodes of disorientation/nausea/confusion with presyncope - initial concern for potential cardiac etiology (malignant MVP syndrome, SVT, vasovagal, etc) however episodes atypical and no evidence of arrhythmia on Apple Watch during most recent episode. Suspect potential panic attack - echo is normal - will place 14-day ZIo XT and also have her keep BP log  - cotninue to wear [place]le Watch  2. 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 Breast CA - 08/2020. ER/PR +, HER2 equivocal. S/p Adriamycin /Cytoxan  + taxol  and XRT - no evidence of cardiotoxicity on echo  4. Anxiety/panic attacks - she has had a very difficult 3 years with breast CA and MVR surgery and much of trauma seems unresolved - we discussed how common this can be after cardiac  surgery - suggested speaking with a counselor  Jules Oar, MD  11:45 AM

## 2023-12-10 NOTE — Patient Instructions (Signed)
 Great to see you today!!!  Medication Changes:  None, continue current medications  Testing/Procedures:  Your provider has recommended that  you wear a Zio Patch for 14 days.  This monitor will record your heart rhythm for our review.  IF you have any symptoms while wearing the monitor please press the button.  If you have any issues with the patch or you notice a red or orange light on it please call the company at [phone].  Once you remove the patch please mail it back to the company as soon as possible so we can get the results.  Special Instructions // Education:  Your physician has requested that you regularly monitor and record your blood pressure readings at home. Please use the same machine at the same time of day to check your readings and record them to bring to your follow-up visit.   Follow-Up in: 3 months   At the [facility], you and your health needs are our priority. We have a designated team specialized in the treatment of Heart Failure. This Care Team includes your primary Heart Failure Specialized Cardiologist (physician), Advanced Practice Providers (APPs- Physician Assistants and Nurse Practitioners), and Pharmacist who all work together to provide you with the care you need, when you need it.   You may see any of the following providers on your designated Care Team at your next follow up:  Dr. Jules Oar Dr. Peder Bourdon Dr. Alwin Baars Dr. Judyth Nunnery Nieves Bars, NP Ruddy Corral, [place] [facility] [place], [place] Dennise Fitz, NP [place] Lee, NP Luster Salters, PharmD   Please be sure to bring in all your medications bottles to every appointment.   Need to Contact Us :  If you have any questions or concerns before your next appointment please send us  a message through [place] or call our office at [phone].    TO LEAVE A MESSAGE FOR THE NURSE SELECT OPTION 2, PLEASE LEAVE A MESSAGE INCLUDING: YOUR NAME DATE OF  BIRTH CALL BACK NUMBER REASON FOR CALL**this is important as we prioritize the call backs  YOU WILL RECEIVE A CALL BACK THE SAME DAY AS LONG AS YOU CALL BEFORE 4:00 PM

## 2023-12-16 ENCOUNTER — Ambulatory Visit: Payer: Self-pay | Admitting: *Deleted

## 2023-12-16 NOTE — Telephone Encounter (Signed)
-----   Message from Conway Dennis sent at 12/16/2023  3:31 PM EDT ----- Please let patient know that the xray shows degeneration. ----- Message ----- From: Interface, Rad Results In Sent: 12/16/2023   8:57 AM EDT To: Percival Brace, NP

## 2023-12-16 NOTE — Telephone Encounter (Signed)
Per Lindsey,NP, called pt with message below. Pt verbalized understanding.  

## 2024-01-03 NOTE — Addendum Note (Signed)
 Encounter addended by: Stan Eans, RN on: 01/03/2024 11:51 AM  Actions taken: Imaging Exam ended

## 2024-01-12 ENCOUNTER — Other Ambulatory Visit (HOSPITAL_BASED_OUTPATIENT_CLINIC_OR_DEPARTMENT_OTHER): Payer: Self-pay

## 2024-03-13 ENCOUNTER — Other Ambulatory Visit (HOSPITAL_BASED_OUTPATIENT_CLINIC_OR_DEPARTMENT_OTHER): Payer: Self-pay

## 2024-03-13 DIAGNOSIS — Z130 Encounter for screening for diseases of the blood and blood-forming organs and certain disorders involving the immune mechanism: Secondary | ICD-10-CM | POA: Diagnosis not present

## 2024-03-13 DIAGNOSIS — Z1330 Encounter for screening examination for mental health and behavioral disorders, unspecified: Secondary | ICD-10-CM | POA: Diagnosis not present

## 2024-03-13 DIAGNOSIS — Z23 Encounter for immunization: Secondary | ICD-10-CM | POA: Diagnosis not present

## 2024-03-13 DIAGNOSIS — Z0000 Encounter for general adult medical examination without abnormal findings: Secondary | ICD-10-CM | POA: Diagnosis not present

## 2024-03-13 DIAGNOSIS — B2790 Infectious mononucleosis, unspecified without complication: Secondary | ICD-10-CM | POA: Diagnosis not present

## 2024-03-13 DIAGNOSIS — I341 Nonrheumatic mitral (valve) prolapse: Secondary | ICD-10-CM | POA: Diagnosis not present

## 2024-03-13 DIAGNOSIS — E041 Nontoxic single thyroid nodule: Secondary | ICD-10-CM | POA: Diagnosis not present

## 2024-03-13 MED ORDER — CLONAZEPAM 1 MG PO TABS
1.0000 mg | ORAL_TABLET | Freq: Two times a day (BID) | ORAL | 0 refills | Status: DC | PRN
  Filled 2024-03-13: qty 60, 30d supply, fill #0

## 2024-03-13 MED ORDER — ZEPBOUND 15 MG/0.5ML ~~LOC~~ SOAJ
15.0000 mg | SUBCUTANEOUS | 0 refills | Status: DC
  Filled 2024-03-13: qty 2, 28d supply, fill #0

## 2024-03-22 LAB — SIGNATERA

## 2024-03-23 DIAGNOSIS — C50412 Malignant neoplasm of upper-outer quadrant of left female breast: Secondary | ICD-10-CM | POA: Diagnosis not present

## 2024-03-23 DIAGNOSIS — Z170 Estrogen receptor positive status [ER+]: Secondary | ICD-10-CM | POA: Diagnosis not present

## 2024-04-10 ENCOUNTER — Other Ambulatory Visit (HOSPITAL_BASED_OUTPATIENT_CLINIC_OR_DEPARTMENT_OTHER): Payer: Self-pay

## 2024-04-10 MED ORDER — CLONAZEPAM 1 MG PO TABS
1.0000 mg | ORAL_TABLET | Freq: Two times a day (BID) | ORAL | 0 refills | Status: DC | PRN
  Filled 2024-04-13: qty 60, 30d supply, fill #0

## 2024-04-13 ENCOUNTER — Other Ambulatory Visit (HOSPITAL_BASED_OUTPATIENT_CLINIC_OR_DEPARTMENT_OTHER): Payer: Self-pay

## 2024-04-13 ENCOUNTER — Encounter: Payer: Self-pay | Admitting: Adult Health

## 2024-04-13 DIAGNOSIS — H9201 Otalgia, right ear: Secondary | ICD-10-CM | POA: Diagnosis not present

## 2024-04-13 DIAGNOSIS — H6691 Otitis media, unspecified, right ear: Secondary | ICD-10-CM | POA: Diagnosis not present

## 2024-04-13 MED ORDER — PREDNISONE 10 MG (21) PO TBPK
ORAL_TABLET | ORAL | 0 refills | Status: DC
  Filled 2024-04-13: qty 21, 6d supply, fill #0

## 2024-04-13 MED ORDER — AMOXICILLIN 500 MG PO CAPS
500.0000 mg | ORAL_CAPSULE | Freq: Two times a day (BID) | ORAL | 0 refills | Status: AC
  Filled 2024-04-13: qty 14, 7d supply, fill #0

## 2024-04-27 ENCOUNTER — Encounter: Payer: Self-pay | Admitting: Adult Health

## 2024-05-05 ENCOUNTER — Other Ambulatory Visit (HOSPITAL_BASED_OUTPATIENT_CLINIC_OR_DEPARTMENT_OTHER): Payer: Self-pay

## 2024-05-05 DIAGNOSIS — H2513 Age-related nuclear cataract, bilateral: Secondary | ICD-10-CM | POA: Diagnosis not present

## 2024-05-05 DIAGNOSIS — H1789 Other corneal scars and opacities: Secondary | ICD-10-CM | POA: Diagnosis not present

## 2024-05-05 DIAGNOSIS — H52203 Unspecified astigmatism, bilateral: Secondary | ICD-10-CM | POA: Diagnosis not present

## 2024-05-05 MED ORDER — FLUCONAZOLE 150 MG PO TABS
150.0000 mg | ORAL_TABLET | ORAL | 0 refills | Status: DC
  Filled 2024-05-05: qty 2, 6d supply, fill #0

## 2024-05-10 DIAGNOSIS — Z08 Encounter for follow-up examination after completed treatment for malignant neoplasm: Secondary | ICD-10-CM | POA: Diagnosis not present

## 2024-05-10 DIAGNOSIS — D2371 Other benign neoplasm of skin of right lower limb, including hip: Secondary | ICD-10-CM | POA: Diagnosis not present

## 2024-05-10 DIAGNOSIS — L905 Scar conditions and fibrosis of skin: Secondary | ICD-10-CM | POA: Diagnosis not present

## 2024-05-10 DIAGNOSIS — L815 Leukoderma, not elsewhere classified: Secondary | ICD-10-CM | POA: Diagnosis not present

## 2024-05-29 ENCOUNTER — Other Ambulatory Visit (HOSPITAL_BASED_OUTPATIENT_CLINIC_OR_DEPARTMENT_OTHER): Payer: Self-pay

## 2024-05-29 MED ORDER — CLONAZEPAM 1 MG PO TABS
1.0000 mg | ORAL_TABLET | Freq: Two times a day (BID) | ORAL | 0 refills | Status: DC | PRN
  Filled 2024-05-29: qty 60, 30d supply, fill #0

## 2024-05-29 MED ORDER — ZEPBOUND 15 MG/0.5ML ~~LOC~~ SOAJ
15.0000 mg | SUBCUTANEOUS | 0 refills | Status: DC
  Filled 2024-05-29: qty 2, 28d supply, fill #0

## 2024-06-05 ENCOUNTER — Other Ambulatory Visit (HOSPITAL_BASED_OUTPATIENT_CLINIC_OR_DEPARTMENT_OTHER): Payer: Self-pay

## 2024-06-05 DIAGNOSIS — R350 Frequency of micturition: Secondary | ICD-10-CM | POA: Diagnosis not present

## 2024-06-05 DIAGNOSIS — Z01419 Encounter for gynecological examination (general) (routine) without abnormal findings: Secondary | ICD-10-CM | POA: Diagnosis not present

## 2024-06-05 MED ORDER — ESTRADIOL 10 MCG VA TABS
10.0000 ug | ORAL_TABLET | VAGINAL | 4 refills | Status: AC
  Filled 2024-06-05: qty 24, 84d supply, fill #0

## 2024-06-05 MED ORDER — METHYLPREDNISOLONE 4 MG PO TBPK
ORAL_TABLET | ORAL | 1 refills | Status: DC
  Filled 2024-06-05: qty 21, 6d supply, fill #0

## 2024-06-07 ENCOUNTER — Other Ambulatory Visit: Payer: Self-pay

## 2024-06-07 DIAGNOSIS — C50412 Malignant neoplasm of upper-outer quadrant of left female breast: Secondary | ICD-10-CM

## 2024-06-08 ENCOUNTER — Inpatient Hospital Stay: Attending: Adult Health | Admitting: Adult Health

## 2024-06-08 ENCOUNTER — Encounter: Payer: Self-pay | Admitting: Adult Health

## 2024-06-08 ENCOUNTER — Inpatient Hospital Stay

## 2024-06-08 VITALS — BP 100/66 | HR 60 | Temp 98.2°F | Resp 17 | Wt 153.3 lb

## 2024-06-08 DIAGNOSIS — C50412 Malignant neoplasm of upper-outer quadrant of left female breast: Secondary | ICD-10-CM | POA: Diagnosis not present

## 2024-06-08 DIAGNOSIS — Z8041 Family history of malignant neoplasm of ovary: Secondary | ICD-10-CM | POA: Insufficient documentation

## 2024-06-08 DIAGNOSIS — Z923 Personal history of irradiation: Secondary | ICD-10-CM | POA: Insufficient documentation

## 2024-06-08 DIAGNOSIS — Z808 Family history of malignant neoplasm of other organs or systems: Secondary | ICD-10-CM | POA: Insufficient documentation

## 2024-06-08 DIAGNOSIS — Z853 Personal history of malignant neoplasm of breast: Secondary | ICD-10-CM | POA: Insufficient documentation

## 2024-06-08 DIAGNOSIS — Z170 Estrogen receptor positive status [ER+]: Secondary | ICD-10-CM | POA: Diagnosis not present

## 2024-06-08 DIAGNOSIS — Z9221 Personal history of antineoplastic chemotherapy: Secondary | ICD-10-CM | POA: Diagnosis not present

## 2024-06-08 DIAGNOSIS — Z801 Family history of malignant neoplasm of trachea, bronchus and lung: Secondary | ICD-10-CM | POA: Insufficient documentation

## 2024-06-08 DIAGNOSIS — Z803 Family history of malignant neoplasm of breast: Secondary | ICD-10-CM | POA: Insufficient documentation

## 2024-06-08 NOTE — Progress Notes (Signed)
 [name] Cancer Follow up:    [name], NP [address] [name] [address]   DIAGNOSIS:  Cancer Staging  Malignant neoplasm of upper-outer quadrant of left breast in female, estrogen receptor positive (HCC) Staging form: Breast, AJCC 8th Edition - Clinical stage from 08/21/2020: Stage IIA (cT1c, cN1, cM0, G3, ER+, PR+, HER2-) - Signed by [name], MD on 08/21/2020 - Pathologic stage from 02/26/2021: ypT0, ypN0, cM0 - Signed by [name], NP on 12/04/2021 Stage prefix: Post-therapy    SUMMARY OF ONCOLOGIC HISTORY: Oncology History  Malignant neoplasm of upper-outer quadrant of left breast in female, estrogen receptor positive (HCC)  08/21/2020 Initial Diagnosis   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%, PR 95%, HER2 equivocal, FISH pending, Ki-67 20%   08/21/2020 Cancer Staging   Staging form: Breast, AJCC 8th Edition - Clinical stage from 08/21/2020: Stage IIA (cT1c, cN1, cM0, G3, ER+, PR+, HER2-) - Signed by [name], MD on 08/21/2020   09/04/2020 Genetic Testing   Negative genetic testing. No pathogenic variants identified on the [facility] Breast Cancer STAT Panel + Multi-Cancer Panel. VUS in SMARCA4 called c.983C>T identified. The report date is 09/04/2020.   The STAT Breast cancer panel offered by [facility] includes sequencing and rearrangement analysis for the following 9 genes:  ATM, BRCA1, BRCA2, CDH1, CHEK2, PALB2, PTEN, STK11 and TP53.    The Multi-Cancer Panel offered by [facility] includes sequencing and/or deletion duplication testing of the following 85 genes: AIP, ALK, APC, ATM, AXIN2,BAP1,  BARD1, BLM, BMPR1A, BRCA1, BRCA2, BRIP1,  CASR, CDC73, CDH1, CDK4, CDKN1B, CDKN1C, CDKN2A (p14ARF), CDKN2A (p16INK4a), CEBPA, CHEK2, CTNNA1, DICER1, DIS3L2, EGFR (c.2369C>T, p.Thr790Met variant only), EPCAM (Deletion/duplication testing only), FH, FLCN, GATA2, GPC3, GREM1 (Promoter region deletion/duplication testing only), HOXB13 (c.251G>A, p.Gly84Glu), HRAS, KIT, MAX, MEN1, MET, MITF (c.952G>A, p.Glu318Lys variant only), MLH1, MSH2, MSH3, MSH6, MUTYH, NBN, NF1, NF2, NTHL1, PALB2, PDGFRA, PHOX2B, PMS2, POLD1, POLE, POT1, PRKAR1A, PTCH1, PTEN, RAD50, RAD51C, RAD51D, RB1, RECQL4, RET, RNF43, RUNX1, SDHAF2, SDHA (sequence changes only), SDHB, SDHC, SDHD, SMAD4, SMARCA4, SMARCB1, SMARCE1, STK11, SUFU, TERC, TERT, TMEM127, TP53, TSC1, TSC2, VHL, WRN and WT1.    09/06/2020 - 01/17/2021 Chemotherapy   AC X 4 foll by Taxol        02/26/2021 Surgery   Left lumpectomy: no residual carcinoma with margins and axillary lymph nodes also negative for carcinoma     02/26/2021 Cancer Staging   Staging form: Breast, AJCC 8th Edition - Pathologic stage from 02/26/2021: No Stage Recommended (ypT0, pN0, cM0) - Signed by [name], NP on 12/04/2021 Stage prefix: Post-therapy   04/18/2021 Surgery   Removal of retained clip, port removal and bilateral breast mammoplasty    05/27/2021 - 07/10/2021 Radiation Therapy    Site Technique Total Dose (Gy) Dose per Fx (Gy) Completed Fx Beam Energies  Breast, Left: Breast_Lt 3D 50.4/50.4 1.8 28/28 6X, 10X  Axilla, Left: Axilla_Lt 3D 50.4/50.4 1.8 28/28 6X, 15X     08/2021 -  Anti-estrogen oral therapy   Unable to tolerate tamoxifen or letrozole , changed to exemestane  daily 09/18/21     CURRENT THERAPY: observation  INTERVAL HISTORY:  Discussed the use of AI scribe software for clinical note transcription with the patient, who  gave verbal consent to proceed.  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 She is currently on observation alone due to intolerance to aromatase inhibitors and tamoxifen. Her most recent bilateral breast screening mammogram was performed on October 07, 2023. Signatera testing, last conducted in February 2025, was negative, though she reports issues with mislabeled samples and confusion about the testing schedule.  She experiences occasional abdominal pain, attributed to a fibroid and scar tissue from previous surgeries, which is not debilitating and occurs sometimes when stretching.  She uses vaginal estrogen for her dryness which she notes is helpful for her vaginal atrophy.        Patient Active Problem List   Diagnosis Date Noted   S/P MVR (mitral valve repair) 07/09/2023   Vitamin D  deficiency 05/04/2023   Right lower quadrant pain 05/04/2023   Squamous cell skin cancer 04/19/2023   Cervical radiculopathy 07/08/2022   Occipital neuralgia of right side 07/01/2021   Peripheral vertigo 07/01/2021   Genetic testing 09/04/2020   Family history of breast cancer    Family history of ovarian cancer    Family history of lung cancer    Malignant neoplasm of upper-outer quadrant of left breast in female, estrogen receptor positive (HCC) 08/21/2020   Headache 04/10/2017   Anxiety 11/16/2013   Hypercoagulable state 11/16/2013   TIA (transient ischemic attack) 11/15/2013   Chest discomfort 01/20/2012   Factor V Leiden 01/20/2012   MVP (mitral valve prolapse) 01/20/2012   Acute bronchitis 12/23/2011    is allergic to sulfa antibiotics and sulfamethoxazole.  MEDICAL HISTORY: Past Medical History:  Diagnosis Date   Anxiety    Arthritis    Bilateral Knees   Blood dyscrasia    MTHFR   Cancer (HCC)    Left Breast   Depression    Family history of breast cancer     Family history of lung cancer    Family history of ovarian cancer    GERD (gastroesophageal reflux disease)    diet controlled   Heart murmur    History of radiation therapy    Left breast, left axilla- 05/27/21-07/10/21- Dr. Lynwood Nasuti   Pneumonia    Seasonal allergies    Stroke (HCC) 1990   r/t birth control. no deficits    SURGICAL HISTORY: Past Surgical History:  Procedure Laterality Date   BREAST BIOPSY Left 2019   x 2 biopsy   BREAST EXCISIONAL BIOPSY Left    BREAST LUMPECTOMY WITH RADIOACTIVE SEED AND SENTINEL LYMPH NODE BIOPSY Left 02/26/2021   Procedure: LEFT BREAST LUMPECTOMY WITH RADIOACTIVE SEED x2; LEFT AXILLARY SENTINEL NODE BIOPSY;  Surgeon: Ebbie Cough, MD;  Location: [facility];  Service: General;  Laterality: Left;   BREAST LUMPECTOMY WITH RADIOFREQUENCY TAG IDENTIFICATION Left 04/18/2021   Procedure: LEFT BREAST MRI WIRE GUIDED EXCISION;  Surgeon: Ebbie Cough, MD;  Location: [facility];  Service: General;  Laterality: Left;   BREAST REDUCTION SURGERY Bilateral 04/18/2021   Procedure: MAMMARY REDUCTION  (BREAST) LEFT ONCOPLASTIC BREAST RECONSTRUCTION, RIGHT BREAST REDUCTION;  Surgeon: Arelia Filippo, MD;  Location: [facility];  Service: Plastics;  Laterality: Bilateral;   CHOLECYSTECTOMY  2008   MITRAL VALVE REPAIR N/A 07/09/2023   Procedure: MITRAL VALVE REPAIR USING  SIMULUS ANNULOPLASTY BAND;  Surgeon: Maryjane Mt, MD;  Location: [facility];  Service: Open Heart Surgery;  Laterality: N/A;   PORT-A-CATH REMOVAL Right 04/18/2021   Procedure: REMOVAL PORT-A-CATH;  Surgeon: Ebbie Cough, MD;  Location: [facility];  Service: General;  Laterality: Right;   PORTACATH PLACEMENT N/A 09/05/2020   Procedure: INSERTION PORT-A-CATH;  Surgeon: Ebbie Cough, MD;  Location: [facility] OR;  Service: General;  Laterality: N/A;  PEC BLOCK   RIGHT/LEFT HEART CATH AND CORONARY ANGIOGRAPHY N/A 05/28/2023    Procedure: RIGHT/LEFT HEART CATH AND CORONARY ANGIOGRAPHY;  Surgeon: Wendel Lurena POUR, MD;  Location: [facility];  Service: Cardiovascular;  Laterality: N/A;   TEE WITHOUT CARDIOVERSION N/A 05/14/2023   Procedure: TRANSESOPHAGEAL ECHOCARDIOGRAM;  Surgeon: Alvan Ronal BRAVO, MD;  Location: [facility] INVASIVE CV LAB;  Service: Cardiovascular;  Laterality: N/A;   TEE WITHOUT CARDIOVERSION N/A 07/09/2023   Procedure: TRANSESOPHAGEAL ECHOCARDIOGRAM;  Surgeon: Maryjane Mt, MD;  Location: [facility] OR;  Service: Open Heart Surgery;  Laterality: N/A;   WISDOM TOOTH EXTRACTION      SOCIAL HISTORY: Social History   Socioeconomic History   Marital status: Married    Spouse name: Not on file   Number of children: Not on file   Years of education: Not on file   Highest education level: Not on file  Occupational History   Not on file  Tobacco Use   Smoking status: Never   Smokeless tobacco: Never  Vaping Use   Vaping status: Never Used  Substance and Sexual Activity   Alcohol use: Not Currently   Drug use: Never   Sexual activity: Yes    Partners: Male    Birth control/protection: None    Comment: MARRIED  Other Topics Concern   Not on file  Social History Narrative   Not on file   Social Drivers of Health   Financial Resource Strain: Low Risk  (03/13/2024)   Received from [facility]   Overall Financial Resource Strain (CARDIA)    How hard is it for you to pay for the very basics like food, housing, medical care, and heating?: Not hard at all  Food Insecurity: No Food Insecurity (06/08/2024)   Hunger Vital Sign    Worried About Running Out of Food in the Last Year: Never true    Ran Out of Food in the Last Year: Never true  Transportation Needs: No Transportation Needs (06/08/2024)   PRAPARE - Administrator, Civil Service (Medical): No    Lack of Transportation (Non-Medical): No  Physical Activity: Sufficiently Active (03/13/2024)   Received from [facility]   Exercise  Vital Sign    On average, how many days per week do you engage in moderate to strenuous exercise (like a brisk walk)?: 3 days    On average, how many minutes do you engage in exercise at this level?: 60 min  Stress: No Stress Concern Present (03/13/2024)   Received from [facility] - Occupational Stress Questionnaire    Do you feel stress - tense, restless, nervous, or anxious, or unable to sleep at night because your mind is troubled all the time - these days?: Not at all  Social Connections: Socially Integrated (03/13/2024)   Received from [facility]   Social Network    How would you rate your social network (family, work, friends)?: Good participation with social networks  Intimate Partner Violence: Not At Risk (06/08/2024)   Humiliation, Afraid, Rape,  and Kick questionnaire    Fear of Current or Ex-Partner: No    Emotionally Abused: No    Physically Abused: No    Sexually Abused: No    FAMILY HISTORY: Family History  Problem Relation Age of Onset   Eczema Father    Allergic rhinitis Brother    Breast cancer Mother        in 53's   Cancer Maternal Uncle        unk type   Lung cancer Maternal Grandfather 42   Heart Problems Paternal Grandfather    Melanoma Cousin    Ovarian cancer Paternal Great-grandmother    Cancer Paternal Great-grandmother        GYN cancer, possibly ovarian?   Cancer Maternal Aunt        half aunts/uncles x5- rare cancers    Review of Systems  Constitutional:  Negative for appetite change, chills, fatigue, fever and unexpected weight change.  HENT:   Negative for hearing loss, lump/mass and trouble swallowing.   Eyes:  Negative for eye problems and icterus.  Respiratory:  Negative for chest tightness, cough and shortness of breath.   Cardiovascular:  Negative for chest pain, leg swelling and palpitations.  Gastrointestinal:  Negative for abdominal distention, abdominal pain, constipation, diarrhea, nausea  and vomiting.  Endocrine: Negative for hot flashes.  Genitourinary:  Negative for difficulty urinating.   Musculoskeletal:  Negative for arthralgias.  Skin:  Negative for itching and rash.  Neurological:  Negative for dizziness, extremity weakness, headaches and numbness.  Hematological:  Negative for adenopathy. Does not bruise/bleed easily.  Psychiatric/Behavioral:  Negative for depression. The patient is not nervous/anxious.       PHYSICAL EXAMINATION    Vitals:   06/08/24 1020  BP: 100/66  Pulse: 60  Resp: 17  Temp: 98.2 F (36.8 C)  SpO2: 100%    Physical Exam Constitutional:      General: She is not in acute distress.    Appearance: Normal appearance. She is not toxic-appearing.  HENT:     Head: Normocephalic and atraumatic.     Mouth/Throat:     Mouth: Mucous membranes are moist.     Pharynx: Oropharynx is clear. No oropharyngeal exudate or posterior oropharyngeal erythema.  Eyes:     General: No scleral icterus. Cardiovascular:     Rate and Rhythm: Normal rate and regular rhythm.     Pulses: Normal pulses.     Heart sounds: Normal heart sounds.  Pulmonary:     Effort: Pulmonary effort is normal.     Breath sounds: Normal breath sounds.  Chest:     Comments: Left breast s/p lumpectomy and radiation, no sign of local recurrence; right breast benign Abdominal:     General: Abdomen is flat. Bowel sounds are normal. There is no distension.     Palpations: Abdomen is soft.     Tenderness: There is no abdominal tenderness.  Musculoskeletal:        General: No swelling.     Cervical back: Neck supple.  Lymphadenopathy:     Cervical: No cervical adenopathy.     Upper Body:     Right upper body: No supraclavicular or axillary adenopathy.     Left upper body: No supraclavicular or axillary adenopathy.  Skin:    General: Skin is warm and dry.     Findings: No rash.  Neurological:     General: No focal deficit present.     Mental Status: She is alert.   Psychiatric:  Mood and Affect: Mood normal.        Behavior: Behavior normal.     LABORATORY DATA:  CBC    Component Value Date/Time   WBC 4.3 12/07/2023 1354   WBC 4.3 07/15/2023 0253   RBC 4.58 12/07/2023 1354   HGB 14.1 12/07/2023 1354   HGB 11.6 08/02/2023 0959   HCT 39.5 12/07/2023 1354   HCT 36.6 08/02/2023 0959   PLT 188 12/07/2023 1354   PLT 374 08/02/2023 0959   MCV 86.2 12/07/2023 1354   MCV 96 08/02/2023 0959   MCH 30.8 12/07/2023 1354   MCHC 35.7 12/07/2023 1354   RDW 15.0 12/07/2023 1354   RDW 13.5 08/02/2023 0959   LYMPHSABS 1.5 12/07/2023 1354   MONOABS 0.3 12/07/2023 1354   EOSABS 0.1 12/07/2023 1354   BASOSABS 0.0 12/07/2023 1354    CMP     Component Value Date/Time   NA 137 12/07/2023 1354   NA 140 08/02/2023 0959   K 4.4 12/07/2023 1354   CL 105 12/07/2023 1354   CO2 28 12/07/2023 1354   GLUCOSE 96 12/07/2023 1354   BUN 12 12/07/2023 1354   BUN 14 08/02/2023 0959   CREATININE 0.80 12/07/2023 1354   CALCIUM 9.0 12/07/2023 1354   PROT 6.7 12/07/2023 1354   PROT 6.2 08/02/2023 0959   ALBUMIN  4.3 12/07/2023 1354   ALBUMIN  4.3 08/02/2023 0959   AST 24 12/07/2023 1354   ALT 17 12/07/2023 1354   ALKPHOS 63 12/07/2023 1354   BILITOT 0.5 12/07/2023 1354   GFRNONAA >60 12/07/2023 1354     ASSESSMENT and THERAPY PLAN:   Malignant neoplasm of upper-outer quadrant of left breast in female, estrogen receptor positive (HCC) 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%, PR 95%, HER2 equivocal, FISH pending, Ki-67 20% T1CN1 stage IIa MammaPrint: High risk: Luminal type B, probability of path CR 6% with chemo and antiestrogen therapy predicted benefit of treatment at  5 years 94.6%, average 10-year risk of recurrence untreated: 29%   Treatment plan: 1.  Neoadjuvant chemotherapy with dose dense Adriamycin  and Cytoxan  followed by Taxol  weekly x12 completed 01/17/2021 2. 02/26/2021 breast conserving surgery with sentinel lymph node and targeted node dissection,: Path CR 0/4 LN Neg 04/18/21: Bilateral Mammoplasty: Benign 3.  Adjuvant radiation therapy 05/28/21- 07/10/21 4.  Followed by adjuvant antiestrogen therapy with tamoxifen--unable to tolerate, on Letrozole  daily switched to exemestane  discontinued for intolerance -------------------------------------------------------------------------------------------------------------------------  PET CT 10/01/21: No evidence of metastatic disease postradiation changes in the lungs Based on severe intolerance antiestrogen therapy we decided not to pursue any further antiestrogen treatments. Signatera testing 09/2023 negativ  Breast cancer surveillance: Guardant reveal Contrast enhanced mammogram ______________________________________________________  Assessment and Plan Assessment & Plan Stage 2A ERPR positive invasive ductal carcinoma, post-treatment. Recent imaging and Signatera testing negative for malignancy. Discussed switching to Guardant testing due to logistical issues with Signatera. - Ordered Guardant Reveal testing for surveillance. -Continue with annual mammograms - Counseled on healthy diet and exercise.   - Scheduled follow-up in six months for labs and evaluation.     All questions were answered. The patient knows to call the clinic with any problems, questions or concerns. We can certainly see the patient much sooner if necessary.  Total encounter time:30 minutes*in  face-to-face visit time, chart review, lab review, care coordination, order entry, and documentation of the encounter time.    Morna Kendall, NP 06/12/24 12:10 AM Medical Oncology and Hematology [facility] [address] [place], [place] [address] Tel. [phone]    Fax. [phone]  *Total Encounter Time as defined by the Centers for Medicare and Medicaid Services includes, in addition to the face-to-face time of a patient visit (documented in the note above) non-face-to-face time: obtaining and reviewing outside history, ordering and reviewing medications, tests or procedures, care coordination (communications with other health care professionals or caregivers) and documentation in the medical record.

## 2024-06-08 NOTE — Assessment & Plan Note (Signed)
 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  Neoadjuvant chemotherapy with dose dense Adriamycin  and Cytoxan  followed by Taxol  weekly x12 completed 01/17/2021 2. 02/26/2021 breast conserving surgery with sentinel lymph node and targeted node dissection,: Path CR 0/4 LN Neg 04/18/21: Bilateral Mammoplasty: Benign 3.  Adjuvant radiation therapy 05/28/21- 07/10/21 4.  Followed by adjuvant antiestrogen therapy with tamoxifen--unable to tolerate, on Letrozole  daily switched to exemestane  discontinued for intolerance -------------------------------------------------------------------------------------------------------------------------  PET CT 10/01/21: No evidence of metastatic disease postradiation changes in the lungs Based on severe intolerance antiestrogen therapy we decided not to pursue any further antiestrogen treatments. Signatera testing 09/2023 negativ  Breast cancer surveillance: Guardant reveal Contrast enhanced mammogram ______________________________________________________

## 2024-06-09 ENCOUNTER — Other Ambulatory Visit (HOSPITAL_BASED_OUTPATIENT_CLINIC_OR_DEPARTMENT_OTHER): Payer: Self-pay

## 2024-06-09 MED ORDER — AMOXICILLIN 500 MG PO CAPS
500.0000 mg | ORAL_CAPSULE | Freq: Three times a day (TID) | ORAL | 1 refills | Status: DC
  Filled 2024-06-09: qty 30, 10d supply, fill #0

## 2024-06-09 MED ORDER — HYDROCODONE-ACETAMINOPHEN 5-325 MG PO TABS
1.0000 | ORAL_TABLET | Freq: Four times a day (QID) | ORAL | 0 refills | Status: AC | PRN
  Filled 2024-06-09: qty 15, 4d supply, fill #0

## 2024-06-12 ENCOUNTER — Other Ambulatory Visit (HOSPITAL_BASED_OUTPATIENT_CLINIC_OR_DEPARTMENT_OTHER): Payer: Self-pay

## 2024-06-12 MED ORDER — METHYLPREDNISOLONE 4 MG PO TBPK
ORAL_TABLET | ORAL | 1 refills | Status: DC
  Filled 2024-06-12: qty 21, 6d supply, fill #0

## 2024-06-15 NOTE — Progress Notes (Signed)
 Guardant Reveal testing was offered via mobile phlebotomy draw. Patient has been unresponsive to mobile phlebotomy outreach attempts.  Plan:  Continue to monitor for patient response.  Consider alternative arrangements (in-clinic draw, rescheduling, or additional outreach methods) if testing remains indicated.  Communicate with ordering provider regarding next steps.

## 2024-07-06 ENCOUNTER — Encounter: Payer: Self-pay | Admitting: Adult Health

## 2024-07-31 ENCOUNTER — Other Ambulatory Visit (HOSPITAL_BASED_OUTPATIENT_CLINIC_OR_DEPARTMENT_OTHER): Payer: Self-pay

## 2024-07-31 MED ORDER — ZEPBOUND 15 MG/0.5ML ~~LOC~~ SOAJ
15.0000 mg | SUBCUTANEOUS | 0 refills | Status: DC
  Filled 2024-07-31: qty 2, 28d supply, fill #0

## 2024-07-31 MED ORDER — CLONAZEPAM 1 MG PO TABS
1.0000 mg | ORAL_TABLET | Freq: Two times a day (BID) | ORAL | 0 refills | Status: AC | PRN
  Filled 2024-07-31: qty 60, 30d supply, fill #0

## 2024-08-10 ENCOUNTER — Ambulatory Visit (HOSPITAL_COMMUNITY)
Admission: RE | Admit: 2024-08-10 | Discharge: 2024-08-10 | Disposition: A | Discharge status: Home / Self Care | Source: Ambulatory Visit | Attending: Internal Medicine | Admitting: Internal Medicine

## 2024-08-10 VITALS — BP 100/62 | HR 61 | Wt 158.4 lb

## 2024-08-10 DIAGNOSIS — Z923 Personal history of irradiation: Secondary | ICD-10-CM | POA: Diagnosis not present

## 2024-08-10 DIAGNOSIS — Z853 Personal history of malignant neoplasm of breast: Secondary | ICD-10-CM | POA: Insufficient documentation

## 2024-08-10 DIAGNOSIS — R001 Bradycardia, unspecified: Secondary | ICD-10-CM | POA: Insufficient documentation

## 2024-08-10 DIAGNOSIS — R410 Disorientation, unspecified: Secondary | ICD-10-CM | POA: Insufficient documentation

## 2024-08-10 DIAGNOSIS — Z9889 Other specified postprocedural states: Secondary | ICD-10-CM | POA: Insufficient documentation

## 2024-08-10 DIAGNOSIS — Z8673 Personal history of transient ischemic attack (TIA), and cerebral infarction without residual deficits: Secondary | ICD-10-CM | POA: Insufficient documentation

## 2024-08-10 DIAGNOSIS — R55 Syncope and collapse: Secondary | ICD-10-CM | POA: Insufficient documentation

## 2024-08-10 DIAGNOSIS — C50412 Malignant neoplasm of upper-outer quadrant of left female breast: Secondary | ICD-10-CM | POA: Diagnosis not present

## 2024-08-10 DIAGNOSIS — F410 Panic disorder [episodic paroxysmal anxiety] without agoraphobia: Secondary | ICD-10-CM | POA: Diagnosis not present

## 2024-08-10 DIAGNOSIS — Z170 Estrogen receptor positive status [ER+]: Secondary | ICD-10-CM | POA: Diagnosis not present

## 2024-08-10 NOTE — Progress Notes (Signed)
 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 ER/PR +, HER2 equivocal. 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 Did not go to [facility] because she had a hard time looking at the hospital due to mental trauma. Does resistance training and also cardio training with walking/skipping routine. Has been feeling ok during workouts.   Prior to surgery she had two episodes were she didn't know where she was. In one episode was in [place] and had a syncopal episode and someone caught her. She came to and then continued travelling but doesn't remember the next few hours. In 10/24 had another episode at work with disorientation, confusion and nausea. Came to ER.Brain MRI ok. ECG and vitals ok. Had another episode of disorientation/nausea/confusion. Lasted a few hours Was seen in Oncology Clinic . HR in 50s SBP 101 Wears AppleWatch which tells her she has low HRV. No HR events on Watch during episode  I saw her in consult in 5/25 echo and zio were normal (two brief runs SVT, 1.3% PVCs). Here for f/u. Doing well well. Lifting weights, working out and playing basketball. Feels fitter than ever. No further syncope. Has clicking in her ribs when she coughs or sneezes.    Past Medical History:  Diagnosis Date   Anxiety    Arthritis    Bilateral Knees    Blood dyscrasia    MTHFR   Cancer (HCC)    Left Breast   Depression    Family history of breast cancer    Family history of lung cancer    Family history of ovarian cancer    GERD (gastroesophageal reflux disease)    diet controlled   Heart murmur    History of radiation therapy    Left breast, left axilla- 05/27/21-07/10/21- Dr. Lynwood Nasuti   Pneumonia    Seasonal allergies    Stroke (HCC) 1990   r/t birth control. no deficits    Current Outpatient Medications  Medication Sig Dispense Refill   amoxicillin  (AMOXIL ) 500 MG capsule Take 4 capsules by mouth once as needed 1 hour prior to dental procedures 25 capsule 2   aspirin  EC 81 MG tablet Take 1 tablet (81 mg total) by mouth daily. Swallow whole. 150 tablet 1   clonazePAM  (KLONOPIN ) 1 MG tablet Take 1 tablet (1 mg total) by mouth 2 (two) times daily as needed for anxiety. 60 tablet 0   Collagen-Vitamin C-Biotin (COLLAGEN PO) Take 1 Scoop by mouth daily.     Creatine POWD Take 1 Scoop by mouth daily.     estradiol  (ESTRACE  VAGINAL) 0.1 MG/GM vaginal cream Place 1 Applicatorful vaginally 3 (three) times a week. 42.5 g 12   Estradiol  10 MCG TABS vaginal tablet Place 1 tablet (10 mcg total) vaginally 2 (two) times  a week at bedtime. 26 tablet 4   fexofenadine (ALLEGRA) 180 MG tablet Take 180 mg by mouth daily.     HYDROcodone -acetaminophen  (NORCO/VICODIN) 5-325 MG tablet Take 1 tablet by mouth every 6 (six) hours as needed. 15 tablet 0   MAGNESIUM  CITRATE PO Take 405 mg by mouth daily.     Misc Natural Products (JOINT SUPPORT COMPLEX PO) Take 4 capsules by mouth daily.     Omega-3 Fatty Acids (FISH OIL PO) Take 813 mg by mouth daily.     sertraline  (ZOLOFT ) 100 MG tablet Take one tablet (100 mg dose) by mouth at bedtime. 90 tablet 1   tirzepatide  (ZEPBOUND ) 15 MG/0.5ML Pen Inject 15 mg into the skin once a week. 6 mL 0   traMADol  (ULTRAM ) 50 MG tablet Take 1 tablet (50 mg total) by mouth every 6 (six) hours as needed for moderate  pain (pain score 4-6). 28 tablet 0   Vitamin D -Vitamin K (VITAMIN D2 + K1 PO) Take 2 drops by mouth daily. Liquid     No current facility-administered medications for this encounter.    Allergies  Allergen Reactions   Sulfa Antibiotics     Rash, urticarial   Sulfamethoxazole Rash    Feel sun burn      Social History   Socioeconomic History   Marital status: Married    Spouse name: Not on file   Number of children: Not on file   Years of education: Not on file   Highest education level: Not on file  Occupational History   Not on file  Tobacco Use   Smoking status: Never   Smokeless tobacco: Never  Vaping Use   Vaping status: Never Used  Substance and Sexual Activity   Alcohol use: Not Currently   Drug use: Never   Sexual activity: Yes    Partners: Male    Birth control/protection: None    Comment: MARRIED  Other Topics Concern   Not on file  Social History Narrative   Not on file   Social Drivers of Health   Tobacco Use: Low Risk (06/08/2024)   Patient History    Smoking Tobacco Use: Never    Smokeless Tobacco Use: Never    Passive Exposure: Not on file  Financial Resource Strain: Low Risk (03/13/2024)   Received from [facility]   Overall Financial Resource Strain (CARDIA)    How hard is it for you to pay for the very basics like food, housing, medical care, and heating?: Not hard at all  Food Insecurity: No Food Insecurity (06/08/2024)   Epic    Worried About Radiation Protection Practitioner of Food in the Last Year: Never true    Ran Out of Food in the Last Year: Never true  Transportation Needs: No Transportation Needs (06/08/2024)   Epic    Lack of Transportation (Medical): No    Lack of Transportation (Non-Medical): No  Physical Activity: Sufficiently Active (03/13/2024)   Received from [facility]   Exercise Vital Sign    On average, how many days per week do you engage in moderate to strenuous exercise (like a brisk walk)?: 3 days    On average, how many minutes do  you engage in exercise at this level?: 60 min  Stress: No Stress Concern Present (03/13/2024)   Received from [facility] - Occupational Stress Questionnaire    Do you feel stress - tense, restless, nervous, or anxious, or unable to sleep at night because  your mind is troubled all the time - these days?: Not at all  Social Connections: Socially Integrated (03/13/2024)   Received from [facility]   Social Network    How would you rate your social network (family, work, friends)?: Good participation with social networks  Intimate Partner Violence: Not At Risk (06/08/2024)   Epic    Fear of Current or Ex-Partner: No    Emotionally Abused: No    Physically Abused: No    Sexually Abused: No  Depression (PHQ2-9): Low Risk (06/08/2024)   Depression (PHQ2-9)    PHQ-2 Score: 0  Alcohol Screen: Low Risk (06/08/2024)   Alcohol Screen    Last Alcohol Screening Score (AUDIT): 0  Housing: Unknown (06/08/2024)   Epic    Unable to Pay for Housing in the Last Year: No    Number of Times Moved in the Last Year: Not on file    Homeless in the Last Year: No  Utilities: Not At Risk (06/08/2024)   Epic    Threatened with loss of utilities: No  Health Literacy: Not on file      Family History  Problem Relation Age of Onset   Eczema Father    Allergic rhinitis Brother    Breast cancer Mother        in 71's   Cancer Maternal Uncle        unk type   Lung cancer Maternal Grandfather 42   Heart Problems Paternal Grandfather    Melanoma Cousin    Ovarian cancer Paternal Great-grandmother    Cancer Paternal Great-grandmother        GYN cancer, possibly ovarian?   Cancer Maternal Aunt        half aunts/uncles x5- rare cancers    Vitals:   08/10/24 0938  BP: 100/62  Pulse: 61  SpO2: 98%  Weight: 71.8 kg (158 lb 6.4 oz)     PHYSICAL EXAM: General:  Well appearing. No respiratory difficulty HEENT: normal Neck: supple. no JVD. Carotids 2+ bilat; no  bruits. No lymphadenopathy or thryomegaly appreciated. Cor: PMI nondisplaced. Regular rate & rhythm. No rubs, gallops or murmurs. Sternum stable Lungs: clear Abdomen: soft, nontender, nondistended. No hepatosplenomegaly. No bruits or masses. Good bowel sounds. Extremities: no cyanosis, clubbing, rash, edema Neuro: alert & oriented x 3, cranial nerves grossly intact. moves all 4 extremities w/o difficulty. Affect pleasant.but tearful at times  ECG: NSR 57 No ST-T wave abnormalities. Personally reviewed    ASSESSMENT & PLAN:  1. Episodes of disorientation/nausea/confusion with presyncope - initial concern for potential cardiac etiology (malignant MVP syndrome, SVT, vasovagal, etc) however episodes atypical and no evidence of arrhythmia on Apple Watch during most recent episode. Suspect potential panic attack - echo is normal -  zio 5/25 normal (two brief runs SVT, 1.3% PVCs) - resolved. - Continue to monitor with Apple Watch  2. 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 Breast CA - 08/2020. ER/PR +, HER2 equivocal. S/p Adriamycin /Cytoxan  + taxol  and XRT - no evidence of cardiotoxicity on echo  4. Anxiety/panic attacks - improving  5. Sternal clicking - suspect costal cartilege strain - will get CXR   Toribio Fuel, MD  9:54 AM  "

## 2024-08-10 NOTE — Addendum Note (Signed)
 Encounter addended by: Buell Powell HERO, RN on: 08/10/2024 10:14 AM  Actions taken: Order list changed, Diagnosis association updated, Clinical Note Signed

## 2024-08-10 NOTE — Patient Instructions (Signed)
 Medication Changes:  None, continue current medications  Testing/Procedures:  Chest x-ray has been ordered for today. A chest x-ray takes a picture of the organs and structures inside the chest, including the heart, lungs, and blood vessels. This test can show several things, including, whether the heart is enlarges; whether fluid is building up in the lungs; and whether pacemaker / defibrillator leads are still in place.  Special Instructions // Education:  Do the following things EVERYDAY: Weigh yourself in the morning before breakfast. Write it down and keep it in a log. Take your medicines as prescribed Eat low salt foods--Limit salt (sodium) to 2000 mg per day.  Stay as active as you can everyday Limit all fluids for the day to less than 2 liters   Follow-Up in: 6 months with general cardiology  1 year with Dr Cherrie (Jan 2027), **PLEASE CALL OUR OFFICE IN NOVEMBER TO SCHEDULE THIS APPOINTMENT    At [facility], you and your health needs are our priority. We have a designated team specialized in the treatment of Heart Failure. This Care Team includes your primary Heart Failure Specialized Cardiologist (physician), Advanced Practice Providers (APPs- Physician Assistants and Nurse Practitioners), and Pharmacist who all work together to provide you with the care you need, when you need it.   You may see any of the following providers on your designated Care Team at your next follow up:  Dr. Toribio Cherrie Dr. Ezra Shuck Dr. Odis Brownie Greig Mosses, NP Caffie Shed, [place] [facility] [place], [place] Beckey Coe, NP Jordan Lee, NP Tinnie Redman, PharmD   Please be sure to bring in all your medications bottles to every appointment.   Need to Contact Us :  If you have any questions or concerns before your next appointment please send us  a message through [place] or call our office at [phone].    TO LEAVE A MESSAGE FOR THE NURSE SELECT  OPTION 2, PLEASE LEAVE A MESSAGE INCLUDING: YOUR NAME DATE OF BIRTH CALL BACK NUMBER REASON FOR CALL**this is important as we prioritize the call backs  YOU WILL RECEIVE A CALL BACK THE SAME DAY AS LONG AS YOU CALL BEFORE 4:00 PM

## 2024-08-18 ENCOUNTER — Ambulatory Visit (HOSPITAL_COMMUNITY): Payer: Self-pay | Admitting: Internal Medicine

## 2024-09-05 ENCOUNTER — Other Ambulatory Visit: Payer: Self-pay | Admitting: *Deleted

## 2024-09-05 DIAGNOSIS — C50412 Malignant neoplasm of upper-outer quadrant of left female breast: Secondary | ICD-10-CM

## 2024-09-05 NOTE — Progress Notes (Signed)
 [employer] renewal orders placed.

## 2024-09-27 ENCOUNTER — Ambulatory Visit: Admitting: Internal Medicine

## 2024-12-06 ENCOUNTER — Inpatient Hospital Stay

## 2024-12-06 ENCOUNTER — Inpatient Hospital Stay: Admitting: Adult Health

## 2024-12-06 ENCOUNTER — Inpatient Hospital Stay: Attending: Adult Health
